# Patient Record
Sex: Female | Born: 1945
Health system: Southern US, Community
[De-identification: ages and names within clinical notes are randomized; demographics above are authoritative.]

## PROBLEM LIST (undated history)

## (undated) DIAGNOSIS — R3915 Urgency of urination: Secondary | ICD-10-CM

## (undated) DIAGNOSIS — M1711 Unilateral primary osteoarthritis, right knee: Secondary | ICD-10-CM

## (undated) DIAGNOSIS — F329 Major depressive disorder, single episode, unspecified: Secondary | ICD-10-CM

## (undated) DIAGNOSIS — M1990 Unspecified osteoarthritis, unspecified site: Secondary | ICD-10-CM

## (undated) DIAGNOSIS — G4700 Insomnia, unspecified: Secondary | ICD-10-CM

## (undated) DIAGNOSIS — R011 Cardiac murmur, unspecified: Secondary | ICD-10-CM

## (undated) DIAGNOSIS — D485 Neoplasm of uncertain behavior of skin: Secondary | ICD-10-CM

## (undated) DIAGNOSIS — Z803 Family history of malignant neoplasm of breast: Secondary | ICD-10-CM

## (undated) DIAGNOSIS — Z801 Family history of malignant neoplasm of trachea, bronchus and lung: Secondary | ICD-10-CM

## (undated) DIAGNOSIS — F419 Anxiety disorder, unspecified: Secondary | ICD-10-CM

## (undated) DIAGNOSIS — F32A Depression, unspecified: Secondary | ICD-10-CM

## (undated) DIAGNOSIS — J189 Pneumonia, unspecified organism: Secondary | ICD-10-CM

## (undated) DIAGNOSIS — C541 Malignant neoplasm of endometrium: Secondary | ICD-10-CM

## (undated) HISTORY — DX: Malignant neoplasm of endometrium: C54.1

## (undated) HISTORY — DX: Neoplasm of uncertain behavior of skin: D48.5

## (undated) HISTORY — DX: Family history of malignant neoplasm of breast: Z80.3

## (undated) HISTORY — PX: COLONOSCOPY: SHX174

## (undated) HISTORY — PX: TONSILLECTOMY: SUR1361

## (undated) HISTORY — PX: EYE SURGERY: SHX253

## (undated) HISTORY — DX: Family history of malignant neoplasm of trachea, bronchus and lung: Z80.1

## (undated) HISTORY — PX: VEIN LIGATION AND STRIPPING: SHX2653

## (undated) NOTE — *Deleted (*Deleted)
Radiation Oncology         [phone] ________________________________  Name: Zoe Kim MRN: [id number]  Date: 03/17/2020  DOB: Nov 12, 1945  Follow-Up Visit Note  CC: Darrow Bussing, MD  Shade Flood, MD  No diagnosis found.  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 Since her last visit, she underwent a bilateral screening mammogram on 12/15/2019 that did not show any evidence of malignancy.   She was last seen by Dr. Andrey Farmer on 12/21/2019, during which time she was noted to have had a complete clinical response to chemotherapy/vaginal brachytherapy.   On review of systems, she reports ***. She denies ***.  ALLERGIES:  is allergic to sulfa antibiotics.  Meds: Current Outpatient Medications  Medication Sig Dispense Refill  . Desvenlafaxine Succinate ER 25 MG TB24 Take 1 tablet by mouth daily.    . diclofenac (VOLTAREN) 75 MG EC tablet Take 75 mg by mouth 2 (two) times daily.    Marland Kitchen gabapentin (NEURONTIN) 300 MG capsule Take 300 mg by mouth at bedtime.    Marland Kitchen MEDROL 4 MG TBPK tablet Take      dosepack as directed on package    . methocarbamol (ROBAXIN) 750 MG tablet Take 750 mg by mouth every 8 (eight) hours as needed.    . methylPREDNISolone acetate (DEPO-MEDROL) 40 MG/ML injection Inject 80 mg into the muscle once.    Marland Kitchen oxybutynin (DITROPAN) 5 MG tablet Take 5 mg by mouth 2 (two) times daily.    . polyethylene glycol-electrolytes (NULYTELY) 420 g solution Take 4,000 mLs by mouth as directed.    Marland Kitchen tiZANidine  (ZANAFLEX) 4 MG tablet      No current facility-administered medications for this encounter.    Physical Findings: The patient is in no acute distress. Patient is alert and oriented.  vitals were not taken for this visit.  Lungs are clear to auscultation bilaterally. Heart has regular rate and rhythm. No palpable cervical, supraclavicular, or axillary adenopathy. Abdomen soft, non-tender, normal bowel sounds. On pelvic examination the external genitalia were unremarkable. A speculum exam was performed. There are no mucosal lesions noted in the vaginal vault. On bimanual  examination there were no pelvic masses appreciated. Vaginal cuff intact.  Some radiation changes noted at the vaginal cuff. ***   Lab Findings: Lab Results  Component Value Date   WBC 5.2 04/27/2019   HGB 10.0 (L) 04/27/2019   HCT 29.7 (L) 04/27/2019   MCV 96.7 04/27/2019   PLT 212 04/27/2019    Radiographic Findings: No results found.  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  Plan: The patient will follow-up with Dr. Andrey Farmer in three months and with radiation oncology in six months.  Total time spent in this encounter was *** minutes which included reviewing the patient's most recent screening mammogram, follow-up with Dr. Andrey Farmer, physical examination, and documentation.  ____________________________________   Billie Lade, PhD, MD  This document  serves as a record of services personally performed by Antony Blackbird, MD. It was created on his behalf by Nikki Dom, a trained medical scribe. The creation of this record is based on the scribe's personal observations and the provider's statements to them. This document has been checked and approved by the attending provider.

---

## 2000-03-27 ENCOUNTER — Other Ambulatory Visit: Admission: RE | Admit: 2000-03-27 | Discharge: 2000-03-27 | Payer: Self-pay | Admitting: Family Medicine

## 2000-05-20 ENCOUNTER — Encounter: Payer: Self-pay | Admitting: Family Medicine

## 2000-05-20 ENCOUNTER — Ambulatory Visit (HOSPITAL_COMMUNITY): Admission: RE | Admit: 2000-05-20 | Discharge: 2000-05-20 | Payer: Self-pay | Admitting: Family Medicine

## 2003-02-16 ENCOUNTER — Other Ambulatory Visit: Admission: RE | Admit: 2003-02-16 | Discharge: 2003-02-16 | Payer: Self-pay | Admitting: Family Medicine

## 2004-06-23 ENCOUNTER — Other Ambulatory Visit: Admission: RE | Admit: 2004-06-23 | Discharge: 2004-06-23 | Payer: Self-pay | Admitting: Family Medicine

## 2004-07-03 ENCOUNTER — Ambulatory Visit (HOSPITAL_COMMUNITY): Admission: RE | Admit: 2004-07-03 | Discharge: 2004-07-03 | Payer: Self-pay | Admitting: Family Medicine

## 2004-07-07 ENCOUNTER — Encounter: Admission: RE | Admit: 2004-07-07 | Discharge: 2004-07-07 | Payer: Self-pay | Admitting: Family Medicine

## 2004-08-04 ENCOUNTER — Ambulatory Visit (HOSPITAL_COMMUNITY): Admission: RE | Admit: 2004-08-04 | Discharge: 2004-08-04 | Payer: Self-pay | Admitting: Gastroenterology

## 2005-10-03 ENCOUNTER — Other Ambulatory Visit: Admission: RE | Admit: 2005-10-03 | Discharge: 2005-10-03 | Payer: Self-pay | Admitting: Family Medicine

## 2011-01-22 ENCOUNTER — Other Ambulatory Visit: Payer: Self-pay | Admitting: Dermatology

## 2011-05-19 ENCOUNTER — Ambulatory Visit (INDEPENDENT_AMBULATORY_CARE_PROVIDER_SITE_OTHER): Payer: Medicare Other

## 2011-05-19 DIAGNOSIS — J158 Pneumonia due to other specified bacteria: Secondary | ICD-10-CM

## 2011-05-19 DIAGNOSIS — J209 Acute bronchitis, unspecified: Secondary | ICD-10-CM

## 2011-05-24 ENCOUNTER — Ambulatory Visit (INDEPENDENT_AMBULATORY_CARE_PROVIDER_SITE_OTHER): Payer: Medicare Other

## 2011-05-24 DIAGNOSIS — R197 Diarrhea, unspecified: Secondary | ICD-10-CM

## 2011-05-24 DIAGNOSIS — R05 Cough: Secondary | ICD-10-CM

## 2011-07-04 ENCOUNTER — Emergency Department (HOSPITAL_COMMUNITY): Payer: Medicare PPO

## 2011-07-04 ENCOUNTER — Encounter (HOSPITAL_COMMUNITY): Payer: Self-pay | Admitting: *Deleted

## 2011-07-04 ENCOUNTER — Emergency Department (HOSPITAL_COMMUNITY)
Admission: EM | Admit: 2011-07-04 | Discharge: 2011-07-04 | Disposition: A | Discharge status: Home / Self Care | Payer: Medicare PPO | Attending: Emergency Medicine | Admitting: Emergency Medicine

## 2011-07-04 DIAGNOSIS — R109 Unspecified abdominal pain: Secondary | ICD-10-CM | POA: Insufficient documentation

## 2011-07-04 DIAGNOSIS — S322XXA Fracture of coccyx, initial encounter for closed fracture: Secondary | ICD-10-CM | POA: Insufficient documentation

## 2011-07-04 DIAGNOSIS — S329XXA Fracture of unspecified parts of lumbosacral spine and pelvis, initial encounter for closed fracture: Secondary | ICD-10-CM

## 2011-07-04 DIAGNOSIS — M25559 Pain in unspecified hip: Secondary | ICD-10-CM | POA: Insufficient documentation

## 2011-07-04 DIAGNOSIS — S3210XA Unspecified fracture of sacrum, initial encounter for closed fracture: Secondary | ICD-10-CM | POA: Insufficient documentation

## 2011-07-04 DIAGNOSIS — S32509A Unspecified fracture of unspecified pubis, initial encounter for closed fracture: Secondary | ICD-10-CM | POA: Insufficient documentation

## 2011-07-04 DIAGNOSIS — F17200 Nicotine dependence, unspecified, uncomplicated: Secondary | ICD-10-CM | POA: Insufficient documentation

## 2011-07-04 HISTORY — DX: Unspecified osteoarthritis, unspecified site: M19.90

## 2011-07-04 MED ORDER — OXYCODONE-ACETAMINOPHEN 5-325 MG PO TABS
2.0000 | ORAL_TABLET | Freq: Once | ORAL | Status: AC
  Administered 2011-07-04: 2 via ORAL
  Filled 2011-07-04: qty 2

## 2011-07-04 MED ORDER — OXYCODONE-ACETAMINOPHEN 5-325 MG PO TABS: 1.0000 | ORAL_TABLET | Freq: Four times a day (QID) | ORAL | Status: AC | PRN

## 2011-07-04 NOTE — ED Notes (Signed)
Warm blankets given.

## 2011-07-04 NOTE — ED Notes (Signed)
Ortho called back. Will be coming

## 2011-07-04 NOTE — Progress Notes (Signed)
Orthopedic Tech Progress Note Patient Details:  Zoe Kim 1946-03-14 [id number]  Other Ortho Devices Type of Ortho Device: Crutches Ortho Device Interventions: Application   Nikki Dom 07/04/2011, 5:28 PM

## 2011-07-04 NOTE — ED Notes (Signed)
Patient fell from a horse during a riding lesson onto her left side/buttock from approx 66 inches.  Patient has pain in her groin/vaginal area.  Patient states she has increased pain when she bears weight.  She denies any other injuries

## 2011-07-04 NOTE — ED Notes (Signed)
Pt fell off horse, landed on left hip. Denying any LOC. No deformities or injuries noted. Pt alert and oriented. Vitals stable. Sensation intact in left extremity. Pain worse upon mobility. Pt sitting on side of bed. No shortening or rotation noted.

## 2011-07-04 NOTE — ED Provider Notes (Signed)
History     CSN: [id number]  Arrival date & time 07/04/11  1249   First MD Initiated Contact with Patient 07/04/11 1300      Chief Complaint  Patient presents with  . Fall    (Consider location/radiation/quality/duration/timing/severity/associated sxs/prior treatment) HPI Comments: Unable to bear weight on LLE due to pain.  No neuro sx  Patient is a 66 y.o. female presenting with fall. The history is provided by the patient. No language interpreter was used.  Fall The accident occurred 3 to 5 hours ago. Incident: while riding a horse. She fell from a height of 3 to 5 ft. She landed on dirt. There was no blood loss. The point of impact was the left hip. Pain location: L groin. The pain is moderate. She was not ambulatory at the scene. There was no entrapment after the fall. There was no drug use involved in the accident. There was no alcohol use involved in the accident. Pertinent negatives include no fever, no numbness, no abdominal pain, no nausea, no vomiting and no headaches. The symptoms are aggravated by standing, flexion, extension and ambulation. She has tried nothing for the symptoms.    Past Medical History  Diagnosis Date  . Arthritis     Past Surgical History  Procedure Date  . Knee surgery   . Vein ligation and stripping   . Tonsillectomy     No family history on file.  History  Substance Use Topics  . Smoking status: Current Everyday Smoker  . Smokeless tobacco: Not on file  . Alcohol Use: No    OB History    Grav Para Term Preterm Abortions TAB SAB Ect Mult Living                  Review of Systems  Constitutional: Negative for fever, activity change, appetite change and fatigue.  HENT: Negative for congestion, sore throat, rhinorrhea, neck pain and neck stiffness.   Respiratory: Negative for cough and shortness of breath.   Cardiovascular: Negative for chest pain and palpitations.  Gastrointestinal: Negative for nausea, vomiting and abdominal pain.   Genitourinary: Negative for dysuria, urgency, frequency and flank pain.  Musculoskeletal: Positive for myalgias, arthralgias and gait problem. Negative for back pain.  Neurological: Negative for dizziness, weakness, light-headedness, numbness and headaches.  All other systems reviewed and are negative.    Allergies  Sulfa antibiotics  Home Medications   Current Outpatient Rx  Name Route Sig Dispense Refill  . NABUMETONE 750 MG PO TABS Oral Take 750 mg by mouth 2 (two) times daily.    . OXYCODONE-ACETAMINOPHEN 5-325 MG PO TABS Oral Take 1-2 tablets by mouth every 6 (six) hours as needed for pain. 20 tablet 0    BP 139/82  Pulse 86  Temp(Src) 97.8 F (36.6 C) (Oral)  Resp 18  Ht 5\' 6"  (1.676 m)  Wt 200 lb (90.719 kg)  BMI 32.28 kg/m2  SpO2 96%  Physical Exam  Nursing note and vitals reviewed. Constitutional: She is oriented to person, place, and time. She appears well-developed and well-nourished.       Appears uncomfortable  HENT:  Head: Normocephalic and atraumatic.  Mouth/Throat: Oropharynx is clear and moist. No oropharyngeal exudate.  Eyes: Conjunctivae and EOM are normal. Pupils are equal, round, and reactive to light.  Neck: Normal range of motion. Neck supple.  Cardiovascular: Normal rate, regular rhythm, normal heart sounds and intact distal pulses.  Exam reveals no gallop and no friction rub.   No murmur  heard. Pulmonary/Chest: Effort normal and breath sounds normal. No respiratory distress.  Abdominal: Soft. Bowel sounds are normal. There is no tenderness.  Musculoskeletal:       Left hip: She exhibits decreased range of motion and tenderness. She exhibits no bony tenderness.  Neurological: She is alert and oriented to person, place, and time. She has normal strength. No cranial nerve deficit or sensory deficit.  Skin: Skin is warm and dry. No rash noted.    ED Course  Procedures (including critical care time)  Labs Reviewed - No data to display Dg Hip  Complete Left  07/04/2011  *RADIOLOGY REPORT*  Clinical Data: Larey Seat from a horse, left hip pain.  LEFT HIP - COMPLETE 2+ VIEW 07/04/2011:  Comparison: None.  Findings: Minimally displaced fractures involving the left superior and inferior pubic rami.  No fractures involving the left femoral neck.  Well-preserved joint space.  Well-preserved bone mineral density.  Included AP pelvis demonstrates a normal appearing symmetric contralateral right hip joint.  Sacroiliac joints and symphysis pubis intact.  Degenerative changes involving the visualized lower lumbar spine.  IMPRESSION: Minimally displaced fractures involving the left superior and inferior pubic rami.  Original Report Authenticated By: Arnell Sieving, M.D.     1. Pelvic fracture       MDM  Pelvic ramus fracture. This is seen on plain film of the hip. We ordered a CAT scan after discussing with orthopedics to rule out diastases of the posterior aspect of the pelvis. She'll be discharged home if there is no additional abnormality with weightbearing as tolerated and aggressive pain control. Instructed to followup with orthopedics tomorrow.  Signed out to dr Juluis Mire, MD 07/04/11 1620

## 2011-07-04 NOTE — ED Provider Notes (Addendum)
Assumed care from Dr. Brooke Dare. Need to check ct of pelvis.  If no severe injury, can go home with wt bearing as tolerated.  Nicholes Stairs, MD 07/04/11 [phone]  I reviewed the ct and discussed the result with Dr. Lestine Box.  He says she can go home and f/u in office tomorrow.  Nicholes Stairs, MD 07/04/11 [phone]

## 2011-08-06 ENCOUNTER — Ambulatory Visit (INDEPENDENT_AMBULATORY_CARE_PROVIDER_SITE_OTHER): Payer: Medicare Other | Admitting: Family Medicine

## 2011-08-06 DIAGNOSIS — F339 Major depressive disorder, recurrent, unspecified: Secondary | ICD-10-CM

## 2011-08-06 DIAGNOSIS — R5381 Other malaise: Secondary | ICD-10-CM

## 2011-08-06 DIAGNOSIS — R5383 Other fatigue: Secondary | ICD-10-CM

## 2011-08-06 DIAGNOSIS — R6889 Other general symptoms and signs: Secondary | ICD-10-CM

## 2011-08-06 LAB — TSH: TSH: 1.845 u[IU]/mL (ref 0.350–4.500)

## 2011-08-06 MED ORDER — FLUOXETINE HCL 20 MG PO TABS: 20.0000 mg | ORAL_TABLET | Freq: Every day | ORAL | Status: DC

## 2011-08-06 NOTE — Progress Notes (Signed)
  Subjective:    Patient ID: Zoe Kim, female    DOB: 25-May-1946, 66 y.o.   MRN: [id number]  HPI Zoe Kim is a 66 y.o. female Hx of depression per chart, but last noted zoloft 50mg  qd 06/28/09 ov.    S/p L pelvic fracture 07/04/11 from fall off horse., no surgery needed - followed Dr. Margaretha Sheffield at [facility] ortho.    See ER notes - Rx percocet.  Stopped percocet 1 week ago.  Excitable/very active and euphoric when taking percocet.  More fatigued the next day.  Due to these side effects- wanted to stop meds.  Next day - in bed all day, more depressed, crying all day.  Unable to work last week due to these symptoms. Feells mentally depressed, general malaise. " feels unplugged".   No recent SSRI - last on zoloft - didn't feel like better.  Concerns for future - retirement, etc coming off medicine.  Had to put down other horse 2 days after injury.  Review of Systems  Constitutional: Positive for activity change and fatigue. Negative for fever, chills and appetite change.  HENT: Positive for dental problem. Negative for congestion, sore throat, rhinorrhea, sneezing, trouble swallowing, neck pain, neck stiffness and sinus pressure.   Respiratory: Negative for cough and shortness of breath.   Cardiovascular: Negative for chest pain.  Neurological: Positive for headaches.       HA last week, not now.  Psychiatric/Behavioral: Positive for dysphoric mood and agitation. Negative for suicidal ideas, hallucinations, self-injury and decreased concentration. The patient is not nervous/anxious.    Cold natured for past year or two. 7 pounds past 1-2 year - weight gain, dry skin, and hair falling out more than prior.    Objective:   Physical Exam  Constitutional: She appears well-developed and well-nourished.  HENT:  Head: Normocephalic and atraumatic.  Right Ear: External ear normal.  Left Ear: External ear normal.  Eyes: Conjunctivae are normal. Pupils are equal, round, and reactive  to light.  Neck: No thyromegaly present.  Cardiovascular: Normal rate, regular rhythm, normal heart sounds and intact distal pulses.   No murmur heard. Pulmonary/Chest: Effort normal and breath sounds normal.  Abdominal: Soft.  Lymphadenopathy:    She has no cervical adenopathy.  Skin: Skin is warm and dry.  Psychiatric: She has a normal mood and affect. Her behavior is normal. Judgment and thought content normal. Her speech is not rapid and/or pressured.       Overall euthymic, but lat affect at times, but tearful at times, laugh at times.           Assessment & Plan:  Zoe Kim is a 66 y.o. female Hx depression off meds for years with recent recurrnec of depressed mood, fatigue after cessation of narcotic, pelvvic fracture and putting down her horse.  Suspect adjustment component with recent stressors, underlying depression.  Weight gain, cold nature, skin/hair change - check TSH. Start prozac 20mg  qd  # 30, 1 rf. SED., pt to contact her prior counselor to restart therapy, and recheck next 2-3 weeks., sooner if any acute worsening.

## 2011-08-06 NOTE — Patient Instructions (Signed)
Start prozac, and call counselor as discussed.  Recheck in next 3 weeks. Return to the clinic or go to the nearest emergency room if any of your symptoms worsen or new symptoms occur.

## 2011-08-09 ENCOUNTER — Ambulatory Visit (INDEPENDENT_AMBULATORY_CARE_PROVIDER_SITE_OTHER): Payer: Medicare Other | Admitting: Family Medicine

## 2011-08-09 VITALS — BP 154/89 | HR 93 | Temp 99.0°F | Resp 16 | Ht 65.5 in | Wt 194.0 lb

## 2011-08-09 DIAGNOSIS — R197 Diarrhea, unspecified: Secondary | ICD-10-CM

## 2011-08-09 DIAGNOSIS — R5383 Other fatigue: Secondary | ICD-10-CM

## 2011-08-09 DIAGNOSIS — R5381 Other malaise: Secondary | ICD-10-CM

## 2011-08-09 MED ORDER — DIPHENOXYLATE-ATROPINE 2.5-0.025 MG PO TABS: 1.0000 | ORAL_TABLET | Freq: Four times a day (QID) | ORAL | Status: AC | PRN

## 2011-08-09 NOTE — Progress Notes (Signed)
  Subjective:    Patient ID: Zoe Kim, female    DOB: [birth year], 66 y.o.   MRN: [id number]  HPI 66 yo female here with abdominal complaints. Fractured pelvix end of January - put on oxycodone by ortho Last week back to ortho - stopped oxycodone "Crashed" next several days Came here last Monday - dx with depression.  Started prozac Mentally starting to feel better. Physically still feels badly - fatigue/exhaustion, diarrhea since off oxycodone and taking pepto bismol.  Diarrhea is watery.  No blood.     Has missed 2 weeks of work.     Review of Systems Negative except as per HPI     Objective:   Physical Exam  Constitutional: Vital signs are normal. She appears well-developed and well-nourished. She is active.  Cardiovascular: Normal rate, regular rhythm, normal heart sounds and normal pulses.   Pulmonary/Chest: Effort normal and breath sounds normal.  Abdominal: Soft. Normal appearance and bowel sounds are normal. She exhibits no distension and no mass. There is no hepatosplenomegaly. There is tenderness. There is no rigidity, no rebound, no guarding, no CVA tenderness, no tenderness at McBurney's point and negative Murphy's sign. No hernia.  Neurological: She is alert.          Assessment & Plan:  Diarrhea - no risk factors or signs of bacterial cause.  Find oxycodone withdrawal less lkely but I suppose possible.  Try lomotil.  Increase fiber.  Monitor.   Fatigue - related to diarrhea vs. Depression symptom not yet improved.  Check cmet.  tSH was normal at last visit.

## 2011-08-10 LAB — COMPREHENSIVE METABOLIC PANEL
ALT: 15 U/L (ref 0–35)
BUN: 7 mg/dL (ref 6–23)
CO2: 24 mEq/L (ref 19–32)
Creat: 0.99 mg/dL (ref 0.50–1.10)
Total Bilirubin: 1 mg/dL (ref 0.3–1.2)

## 2011-08-31 ENCOUNTER — Other Ambulatory Visit: Payer: Self-pay | Admitting: Family Medicine

## 2011-08-31 ENCOUNTER — Encounter: Payer: Self-pay | Admitting: Family Medicine

## 2011-08-31 ENCOUNTER — Ambulatory Visit (INDEPENDENT_AMBULATORY_CARE_PROVIDER_SITE_OTHER): Payer: Medicare Other | Admitting: Family Medicine

## 2011-08-31 VITALS — BP 143/87 | HR 76 | Temp 97.8°F | Resp 16 | Ht 65.0 in | Wt 192.0 lb

## 2011-08-31 DIAGNOSIS — Z0000 Encounter for general adult medical examination without abnormal findings: Secondary | ICD-10-CM

## 2011-08-31 DIAGNOSIS — Z139 Encounter for screening, unspecified: Secondary | ICD-10-CM

## 2011-08-31 DIAGNOSIS — Z124 Encounter for screening for malignant neoplasm of cervix: Secondary | ICD-10-CM

## 2011-08-31 MED ORDER — ZOSTER VACCINE LIVE 19400 UNT/0.65ML ~~LOC~~ SOLR: 0.6500 mL | Freq: Once | SUBCUTANEOUS | Status: AC

## 2011-08-31 MED ORDER — DIPHENOXYLATE-ATROPINE 2.5-0.025 MG PO TABS: 1.0000 | ORAL_TABLET | Freq: Four times a day (QID) | ORAL | Status: AC | PRN

## 2011-08-31 NOTE — Patient Instructions (Signed)
Follow up in next 1 month to discuss meds and abdominal symptoms Sooner if any worsening.

## 2011-08-31 NOTE — Progress Notes (Signed)
  Subjective:    Patient ID: Zoe Kim, female    DOB: February 24, 1946, 66 y.o.   MRN: [id number]  HPI Zoe Kim is a 66 y.o. female Annual Medicare pe - see ppwk.  Last pap smear years ago. Depression - improved.  Tolerating Prozac at current dose - see prior visits and depression scale - scanned.  Diarrhea if not taking lomotil  - 1 per day.  Scheduling colonoscopy.  BM qd when on lomotil.  Only having to take lomotil once per day.   Not fasting today.    Review of Systems Per P[facility] -  13 point ROS reviewed.    Objective:   Physical Exam  Constitutional: She is oriented to person, place, and time. She appears well-developed and well-nourished.  HENT:  Head: Normocephalic and atraumatic.  Right Ear: External ear normal.  Left Ear: External ear normal.  Eyes: Conjunctivae and EOM are normal. Pupils are equal, round, and reactive to light.  Neck: Normal range of motion. Neck supple. No thyromegaly present.  Cardiovascular: Normal rate, regular rhythm, normal heart sounds and intact distal pulses.   Pulmonary/Chest: Effort normal and breath sounds normal.  Abdominal: Soft. Bowel sounds are normal. She exhibits no distension. There is no tenderness. There is no rebound and no guarding.  Genitourinary: Vagina normal and uterus normal. Cervix exhibits no motion tenderness and no friability. Right adnexum displays no mass and no tenderness. Left adnexum displays no tenderness.       Polypoid appearing os of cervical canal. NT, no blood seen.  Neurological: She is alert and oriented to person, place, and time.  Skin: Skin is warm and dry.  Psychiatric: She has a normal mood and affect. Her behavior is normal. Judgment and thought content normal.       Assessment & Plan:  Annual Medicare Wellness visit. Not fasting, but had recent CMP. Can check lipids at future visit when fasting. Check Pap5 - if negative/normal, consider cessation of Pap testing as over 65. Polyp -  appearing cervical os, without friability.  Pap results pending.  Will give numbers for mammogram for her to schedule.  Zostavax rx sent to pharmacy  Diarrhea - ? Med related vs IBS/functional.  Can continue Lomotil one per day for now, but recheck next few weeks (within 1 month) to discuss. Follow up colonoscopy to be scheduled by pt - already had a reminder card.  Hemosure sent home with pt.  Visual acuity noted - 20/50.   2 years since optho visit.  Pt is scheduling follow up.  Recheck  in 1 month.

## 2011-09-01 NOTE — Progress Notes (Signed)
Called pt, [facility] and she would not need referral to set up appt

## 2011-09-06 LAB — PAP IG AND HPV HIGH-RISK: HPV DNA High Risk: NOT DETECTED

## 2011-10-12 ENCOUNTER — Ambulatory Visit: Payer: Medicare Other | Admitting: Family Medicine

## 2011-10-19 ENCOUNTER — Other Ambulatory Visit: Payer: Self-pay | Admitting: Family Medicine

## 2011-10-26 ENCOUNTER — Ambulatory Visit: Payer: Medicare Other | Admitting: Family Medicine

## 2011-11-16 ENCOUNTER — Ambulatory Visit (INDEPENDENT_AMBULATORY_CARE_PROVIDER_SITE_OTHER): Payer: Medicare Other | Admitting: Family Medicine

## 2011-11-16 VITALS — BP 132/81 | HR 82 | Temp 97.8°F | Resp 16 | Ht 66.0 in | Wt 194.6 lb

## 2011-11-16 DIAGNOSIS — R197 Diarrhea, unspecified: Secondary | ICD-10-CM

## 2011-11-16 DIAGNOSIS — Z23 Encounter for immunization: Secondary | ICD-10-CM

## 2011-11-16 DIAGNOSIS — G4700 Insomnia, unspecified: Secondary | ICD-10-CM

## 2011-11-16 DIAGNOSIS — F329 Major depressive disorder, single episode, unspecified: Secondary | ICD-10-CM

## 2011-11-16 MED ORDER — ZOSTER VACCINE LIVE 19400 UNT/0.65ML ~~LOC~~ SOLR: 0.6500 mL | Freq: Once | SUBCUTANEOUS | Status: AC

## 2011-11-16 NOTE — Progress Notes (Signed)
  Subjective:    Patient ID: Charlie Pitter, female    DOB: May 29, 1946, 66 y.o.   MRN: [id number]  HPI SEJAL COFIELD is a 66 y.o. female Diarrhea - see prior ov's in march - treated with Lomotil QD prn. Resolved with immodium.    Appt in July for colonoscopy and mammogram.  Hx depression -  off meds for years with recurrence - started Prozac 20mg  qd on 08/06/11 for depressed mood, fatigue after cessation of narcotic, pelvic fracture and putting down her horse.  Suspect adjustment component with recent stressors, underlying depression. Improved at 08/31/11 office visit. Had to reschedule last few appointments.  Off meds for 1 month - feels fine.  Thinks that depression usually due to stress and sleep deprivation.  Taking advil pm - few times per week.  Able to sleep all night with this meds. Riding horse again.  Shingles vaccine - has to be given in doctor's office to be covered.    Review of Systems  Constitutional: Negative for fever and chills.  Gastrointestinal: Negative for nausea, vomiting, abdominal pain and diarrhea.  Psychiatric/Behavioral: Positive for disturbed wake/sleep cycle. Negative for suicidal ideas and dysphoric mood.       Objective:   Physical Exam  Constitutional: She is oriented to person, place, and time. She appears well-developed and well-nourished.  HENT:  Head: Normocephalic and atraumatic.  Eyes: Conjunctivae are normal. Pupils are equal, round, and reactive to light.  Cardiovascular: Normal rate, regular rhythm, normal heart sounds and intact distal pulses.   Pulmonary/Chest: Effort normal.  Abdominal: Soft.  Neurological: She is alert and oriented to person, place, and time.  Skin: Skin is warm and dry.  Psychiatric: She has a normal mood and affect. Her behavior is normal.       Assessment & Plan:  CHAYLEE EHRSAM is a 66 y.o. female 1. Depression    Mood ok without meds. Episodic insomnia - but controlled with occasional Advil PM  use. Should improve as activity/exercise level increases.   Diarrhea - resolved.   [place] [facility] for shingles vaccine - rx given.

## 2012-05-22 ENCOUNTER — Telehealth: Payer: Self-pay

## 2012-05-22 ENCOUNTER — Ambulatory Visit (INDEPENDENT_AMBULATORY_CARE_PROVIDER_SITE_OTHER): Payer: Medicare PPO | Admitting: Family Medicine

## 2012-05-22 VITALS — BP 145/84 | HR 82 | Temp 97.9°F | Resp 18 | Ht 66.0 in | Wt 198.0 lb

## 2012-05-22 DIAGNOSIS — R7989 Other specified abnormal findings of blood chemistry: Secondary | ICD-10-CM

## 2012-05-22 DIAGNOSIS — R6889 Other general symptoms and signs: Secondary | ICD-10-CM

## 2012-05-22 DIAGNOSIS — N950 Postmenopausal bleeding: Secondary | ICD-10-CM

## 2012-05-22 DIAGNOSIS — R109 Unspecified abdominal pain: Secondary | ICD-10-CM

## 2012-05-22 DIAGNOSIS — R5381 Other malaise: Secondary | ICD-10-CM

## 2012-05-22 DIAGNOSIS — R5383 Other fatigue: Secondary | ICD-10-CM

## 2012-05-22 DIAGNOSIS — R1030 Lower abdominal pain, unspecified: Secondary | ICD-10-CM

## 2012-05-22 LAB — POCT URINALYSIS DIPSTICK
Bilirubin, UA: NEGATIVE
Glucose, UA: NEGATIVE
Leukocytes, UA: NEGATIVE
Nitrite, UA: NEGATIVE
Urobilinogen, UA: 0.2

## 2012-05-22 LAB — POCT CBC
Granulocyte percent: 60.9 %G (ref 37–80)
HCT, POC: 42.6 % (ref 37.7–47.9)
Hemoglobin: 13.3 g/dL (ref 12.2–16.2)
Lymph, poc: 2.6 (ref 0.6–3.4)
MCH, POC: 28.3 pg (ref 27–31.2)
MCHC: 31.2 g/dL — AB (ref 31.8–35.4)
MCV: 90.6 fL (ref 80–97)
MID (cbc): 0.5 (ref 0–0.9)
MPV: 8.2 fL (ref 0–99.8)
POC Granulocyte: 4.9 (ref 2–6.9)
POC LYMPH PERCENT: 32.9 %L (ref 10–50)
POC MID %: 6.2 %M (ref 0–12)
Platelet Count, POC: 334 10*3/uL (ref 142–424)
RBC: 4.7 M/uL (ref 4.04–5.48)
RDW, POC: 13.6 %
WBC: 8 10*3/uL (ref 4.6–10.2)

## 2012-05-22 LAB — COMPREHENSIVE METABOLIC PANEL
ALT: 22 U/L (ref 0–35)
AST: 23 U/L (ref 0–37)
Albumin: 4.1 g/dL (ref 3.5–5.2)
Alkaline Phosphatase: 152 U/L — ABNORMAL HIGH (ref 39–117)
BUN: 9 mg/dL (ref 6–23)
CO2: 28 mEq/L (ref 19–32)
Calcium: 9.2 mg/dL (ref 8.4–10.5)
Chloride: 107 mEq/L (ref 96–112)
Creat: 0.86 mg/dL (ref 0.50–1.10)
Glucose, Bld: 88 mg/dL (ref 70–99)
Potassium: 3.9 mEq/L (ref 3.5–5.3)
Sodium: 141 mEq/L (ref 135–145)
Total Bilirubin: 0.5 mg/dL (ref 0.3–1.2)
Total Protein: 6.7 g/dL (ref 6.0–8.3)

## 2012-05-22 LAB — POCT UA - MICROSCOPIC ONLY
Casts, Ur, LPF, POC: NEGATIVE
Crystals, Ur, HPF, POC: NEGATIVE
Mucus, UA: NEGATIVE
Yeast, UA: NEGATIVE

## 2012-05-22 LAB — TSH: TSH: 1.697 u[IU]/mL (ref 0.350–4.500)

## 2012-05-22 NOTE — Telephone Encounter (Signed)
DR. Neva Seat LEFT A VOICEMAIL FOR PT. WANTING TO KNOW THE BEST TIME TO CLL HER BACK. SHE SAID ANY TIME AFTER 9AM TOMORROW (12/20) SHE WILL BE AVAILABLE  [phone] OR [phone]
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
## 2012-05-23 NOTE — Telephone Encounter (Signed)
Called pt - see lab notes.

## 2012-05-25 ENCOUNTER — Ambulatory Visit (INDEPENDENT_AMBULATORY_CARE_PROVIDER_SITE_OTHER): Payer: Medicare PPO | Admitting: Emergency Medicine

## 2012-05-25 ENCOUNTER — Ambulatory Visit: Payer: Medicare PPO

## 2012-05-25 VITALS — BP 128/85 | HR 93 | Temp 98.5°F | Resp 17 | Ht 66.5 in | Wt 198.0 lb

## 2012-05-25 DIAGNOSIS — R079 Chest pain, unspecified: Secondary | ICD-10-CM

## 2012-05-25 DIAGNOSIS — R0609 Other forms of dyspnea: Secondary | ICD-10-CM

## 2012-05-25 DIAGNOSIS — R5381 Other malaise: Secondary | ICD-10-CM

## 2012-05-25 DIAGNOSIS — R5383 Other fatigue: Secondary | ICD-10-CM

## 2012-05-25 DIAGNOSIS — R0989 Other specified symptoms and signs involving the circulatory and respiratory systems: Secondary | ICD-10-CM

## 2012-05-25 NOTE — Progress Notes (Signed)
  Subjective:    Patient ID: Zoe Kim, female    DOB: [birth year], 66 y.o.   MRN: [id number]  HPI patient states for the last year she has felt fatigued with early dyspnea on exertion. She denies any recent episodes of chest pain. She states she does have discomfort in her chest when she rides in a car. She tried to do some dancing recently and was able to dance for about 10 seconds and became short of breath. Her main complaint is of horrible fatigue. She does not have any energy to do any other physical activity.    Review of Systems     Objective:   Physical Exam HEENT exam is unremarkable. Neck is supple. Chest is clear to auscultation and percussion. Cardiac exam reveals a regular rate no gallop is heard. Soft liver and spleen not enlarged there no areas of tenderness. Exam reveals trace edema no calf tenderness  [facility] reading (PRIMARY) by  Dr.Quorra Rosene no acute disease  EKG no acute change     Assessment & Plan:  Appointment has been made with cardiologist so  that he can do further evaluation.

## 2012-05-27 ENCOUNTER — Ambulatory Visit
Admission: RE | Admit: 2012-05-27 | Discharge: 2012-05-27 | Disposition: A | Discharge status: Home / Self Care | Payer: Medicare PPO | Source: Ambulatory Visit | Attending: Family Medicine | Admitting: Family Medicine

## 2012-05-27 DIAGNOSIS — R1030 Lower abdominal pain, unspecified: Secondary | ICD-10-CM

## 2012-05-27 DIAGNOSIS — N950 Postmenopausal bleeding: Secondary | ICD-10-CM

## 2012-06-03 ENCOUNTER — Telehealth: Payer: Self-pay

## 2012-06-03 NOTE — Telephone Encounter (Signed)
Please call the patient. No acute findings by ultrasound. Negative for gallstones or cholecystitis. Likely mild fatty liver, work to improve diet. Some atherosclerosis of the aorta with mild ectasia. Could consider repeat in 1-2 years to document trending. Follow up as directed by Dr. Neva Seat.  I have advised patient of this, and she is asking about stress test referral. This has been sent to Dr Jacinto Halim, I have given her the number for his office so she can call about the appt.

## 2012-06-03 NOTE — Telephone Encounter (Signed)
Pt has recently been sent to [facility] by dr Francene Finders not heard any results would like [place] to call her with these at [phone] or [phone]

## 2012-06-06 ENCOUNTER — Ambulatory Visit: Payer: Medicare Other | Admitting: Family Medicine

## 2012-06-09 ENCOUNTER — Ambulatory Visit (INDEPENDENT_AMBULATORY_CARE_PROVIDER_SITE_OTHER): Payer: Medicare PPO | Admitting: Family Medicine

## 2012-06-09 ENCOUNTER — Encounter: Payer: Self-pay | Admitting: Family Medicine

## 2012-06-09 VITALS — BP 130/80 | HR 95 | Temp 99.0°F | Resp 16 | Ht 65.0 in | Wt 196.0 lb

## 2012-06-09 DIAGNOSIS — Z23 Encounter for immunization: Secondary | ICD-10-CM

## 2012-06-09 DIAGNOSIS — R0989 Other specified symptoms and signs involving the circulatory and respiratory systems: Secondary | ICD-10-CM

## 2012-06-09 DIAGNOSIS — N950 Postmenopausal bleeding: Secondary | ICD-10-CM

## 2012-06-09 DIAGNOSIS — Z8249 Family history of ischemic heart disease and other diseases of the circulatory system: Secondary | ICD-10-CM

## 2012-06-09 NOTE — Progress Notes (Signed)
Subjective:    Patient ID: Zoe Kim, female    DOB: 09-27-1945, 67 y.o.   MRN: [id number]  HPI Zoe Kim is a 67 y.o. female  See last ov on 05/22/12. Fatigue, generalized malaise.  Further hx on phone call after visit - episodic DOE for past year, and CP at times riding in a car. Multiple family members with CAD including Dad and brother with heart disease and MI. She has not had stress testing. Seen by Dr. Cleta Alberts in follow up - NAD on CXR, and no acute findings on EKG.  Referred to cardiology (Dr. Jacinto Halim) for eval.  Has stress scheduled for tomorrow at 3:30.  Felt a "bubble sensation/tickle in chest: when sitting at computer - felt like heart paused.  Trouble to breathe and near syncopal, but did not pass out. Only lasted few seconds. No recent change in activity.  Other hx - multiple family members with COPD.   Elevated Alk Phos - increased slightly - 158 in March 2013, 152 last ov. No acute findings by abdominal ultrasound. Negative for gallstones or cholecystitis. Likely mild fatty liver, some atherosclerosis of the aorta with mild ectasia, with possible repeat in 1-2 years to document trending. Has been changing diet - less fast food - none recently.    Post menopausal bleeding - referred to OBGYN - has not received call.  Bleeding stopped after one week - small amount of spotting this morning.    Tetanus today, flu vaccine 2 weeks.    Results for orders placed in visit on 05/22/12  POCT CBC      Component Value Range   WBC 8.0  4.6 - 10.2 K/uL   Lymph, poc 2.6  0.6 - 3.4   POC LYMPH PERCENT 32.9  10 - 50 %L   MID (cbc) 0.5  0 - 0.9   POC MID % 6.2  0 - 12 %M   POC Granulocyte 4.9  2 - 6.9   Granulocyte percent 60.9  37 - 80 %G   RBC 4.70  4.04 - 5.48 M/uL   Hemoglobin 13.3  12.2 - 16.2 g/dL   HCT, POC 78.2  95.6 - 47.9 %   MCV 90.6  80 - 97 fL   MCH, POC 28.3  27 - 31.2 pg   MCHC 31.2 (*) 31.8 - 35.4 g/dL   RDW, POC 21.3     Platelet Count, POC 334  142 -  424 K/uL   MPV 8.2  0 - 99.8 fL  POCT URINALYSIS DIPSTICK      Component Value Range   Color, UA yellow     Clarity, UA clear     Glucose, UA neg     Bilirubin, UA neg     Ketones, UA neg     Spec Grav, UA >=1.030     Blood, UA large     pH, UA 5.5     Protein, UA neg     Urobilinogen, UA 0.2     Nitrite, UA neg     Leukocytes, UA Negative    POCT UA - MICROSCOPIC ONLY      Component Value Range   WBC, Ur, HPF, POC 0-1     RBC, urine, microscopic 0-3     Bacteria, U Microscopic trace     Mucus, UA neg     Epithelial cells, urine per micros 0-1     Crystals, Ur, HPF, POC neg     Casts, Ur, LPF, POC neg  Yeast, UA neg    COMPREHENSIVE METABOLIC PANEL      Component Value Range   Sodium 141  135 - 145 mEq/L   Potassium 3.9  3.5 - 5.3 mEq/L   Chloride 107  96 - 112 mEq/L   CO2 28  19 - 32 mEq/L   Glucose, Bld 88  70 - 99 mg/dL   BUN 9  6 - 23 mg/dL   Creat 1.61  0.96 - 0.45 mg/dL   Total Bilirubin 0.5  0.3 - 1.2 mg/dL   Alkaline Phosphatase 152 (*) 39 - 117 U/L   AST 23  0 - 37 U/L   ALT 22  0 - 35 U/L   Total Protein 6.7  6.0 - 8.3 g/dL   Albumin 4.1  3.5 - 5.2 g/dL   Calcium 9.2  8.4 - 40.9 mg/dL  TSH      Component Value Range   TSH 1.697  0.350 - 4.500 uIU/mL    Review of Systems  Constitutional: Negative for fever and chills.  Respiratory: Positive for chest tightness (as above.) and shortness of breath.   Genitourinary: Positive for vaginal bleeding.   As above.     Objective:   Physical Exam  Vitals reviewed. Constitutional: She is oriented to person, place, and time. She appears well-developed and well-nourished.  HENT:  Head: Normocephalic and atraumatic.  Eyes: Conjunctivae normal and EOM are normal. Pupils are equal, round, and reactive to light.  Neck: Carotid bruit is not present.  Cardiovascular: Normal rate, regular rhythm, normal heart sounds and intact distal pulses.   Pulmonary/Chest: Effort normal and breath sounds normal.  Abdominal:  Soft. She exhibits no pulsatile midline mass. There is no tenderness.  Neurological: She is alert and oriented to person, place, and time.  Skin: Skin is warm and dry.  Psychiatric: She has a normal mood and affect. Her behavior is normal.        Assessment & Plan:  Zoe Kim is a 67 y.o. female 1. Need for prophylactic vaccination with combined diphtheria-tetanus-pertussis (DTP) vaccine  Tdap vaccine greater than or equal to 7yo IM given.  2. DOE (dyspnea on exertion)   planning on follow up with cards for eval and stress testing.  Rtc/er cp precautions reviewed.   3. FH: CAD (coronary artery disease)    4. Postmenopausal bleeding  Referral placed to OBGYN.     Patient Instructions  keep appointment for stress test.  We are checking into your appointment to a gynecologist.  Return to the clinic or go to the nearest emergency room if any of your symptoms worsen or new symptoms occur.

## 2012-06-09 NOTE — Patient Instructions (Signed)
keep appointment for stress test.  We are checking into your appointment to a gynecologist.  Return to the clinic or go to the nearest emergency room if any of your symptoms worsen or new symptoms occur.

## 2012-06-10 ENCOUNTER — Telehealth: Payer: Self-pay | Admitting: *Deleted

## 2012-06-10 NOTE — Telephone Encounter (Signed)
Continued from previous phone message Stated that we cannot help her and goodbye.

## 2012-06-10 NOTE — Telephone Encounter (Signed)
Pt called and was very upset that someone had called and she missed the call.  I placed her on hold and tried to find out who possibly could have called her.  There was no one in referrals, no one in labs, or no TL that could have called her.  There were no messages in chart at all.  She stated that she had dialed [phone] and our number had came up.  Pt was very frustrated that I could not help her and

## 2012-08-28 ENCOUNTER — Encounter: Payer: Self-pay | Admitting: Emergency Medicine

## 2013-06-25 ENCOUNTER — Telehealth: Payer: Self-pay

## 2013-06-25 DIAGNOSIS — Z135 Encounter for screening for eye and ear disorders: Secondary | ICD-10-CM

## 2013-06-25 NOTE — Telephone Encounter (Signed)
Pt is stating that she saw dr Carlota Raspberry a couple of months ago and scheduled a self appt with dr Bing Plume but found out today that she needs a referral   Best number [phone]

## 2013-06-25 NOTE — Telephone Encounter (Signed)
I don't mind referring her, but what is she seeing Dr. Bing Plume for? And if not already scheduled, recommend follow up appt with me as last ov over a year ago.

## 2013-06-26 NOTE — Telephone Encounter (Signed)
Pt is seeing Dr. Bing Plume for an eye exam- routine screening. She is having increased difficulty seeing at night. Cataracts runs in her family and she would like to be tested. She knows of  Dr. Bing Plume personally and chose to see him.   FYI Pt will call this afternoon to make a follow up appt with you.   Sent in Referral for optometrist.

## 2013-12-14 ENCOUNTER — Ambulatory Visit (INDEPENDENT_AMBULATORY_CARE_PROVIDER_SITE_OTHER): Payer: Medicare PPO | Admitting: Family Medicine

## 2013-12-14 ENCOUNTER — Encounter: Payer: Self-pay | Admitting: Family Medicine

## 2013-12-14 VITALS — BP 134/78 | HR 91 | Temp 98.1°F | Resp 16 | Ht 65.0 in | Wt 188.9 lb

## 2013-12-14 DIAGNOSIS — R413 Other amnesia: Secondary | ICD-10-CM

## 2013-12-14 DIAGNOSIS — G4700 Insomnia, unspecified: Secondary | ICD-10-CM

## 2013-12-14 DIAGNOSIS — F341 Dysthymic disorder: Secondary | ICD-10-CM

## 2013-12-14 DIAGNOSIS — F418 Other specified anxiety disorders: Secondary | ICD-10-CM

## 2013-12-14 MED ORDER — FLUOXETINE HCL 20 MG PO CAPS
20.0000 mg | ORAL_CAPSULE | Freq: Every day | ORAL | Status: DC
Start: 2013-12-14 — End: 2014-01-11

## 2013-12-14 MED ORDER — CLONAZEPAM 0.5 MG PO TABS: 0.5000 mg | ORAL_TABLET | Freq: Two times a day (BID) | ORAL | Status: DC | PRN

## 2013-12-14 NOTE — Patient Instructions (Signed)
Restart Prozac once per day. Klonopin twice per day if needed. Follow up with me in 3-4 weeks, but if any worsening of symptoms - see me sooner or check in at [facility] emergency room.   Try calling Arvil Chaco: [phone].  If no availability with her - let me know, and I can get you some other numbers.

## 2013-12-14 NOTE — Progress Notes (Addendum)
Subjective:  This chart was scribed for Zoe Ray, MD by Zoe Kim, Medical Scribe. This patient was seen in Room 21 and the patient's care was started at 1:30 PM.   Patient ID: Zoe Kim, female    DOB: [birth year], 68 y.o.   MRN: [id number]  HPI HPI Comments: Zoe Kim is a 68 y.o. female who presents to the [facility] complaining of memory loss.  The patient states that she had an "episode" last week and wanted to be put in a home.  The patient states over the past 4-5 months she has had trouble remembering people's names.  She states that she has experienced these symptoms before but they have become more troublesome lately.  The patient states last week a woman came into her office claiming that she had made an appointment with her and she did not remember making the appointment or the woman.  The patient states that she became annoyed with the woman and she kicked her out of her office.  She lists SI as an associated symptom.  She states that when she gets angry she will smack herself on her head with whatever she has on hand.  She states that the pain she experiences is a relief.  She states that she was frustrated with herself last week and told her husband that "she would be better off gone".  The patient states that she is not sure if she would do anything to kill herself but she does wish she would disappear at times. Does not have any actual plan on suicide.  She states that she saw Zoe Kim when she first moved to [facility] for counseling when she was going through a divorce and the loss of her brother.    The patient states that she has been experiencing a lot of stress over the last 2 years but states that her stress has gradually worsened over the past couple of weeks.  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  She states that she feels as though the board of directors are not on her side and feels as though they are trying to get her to quit her job.  The patient lists sleep disturbance as an associated symptom.  She states that she has been using Ibuprofen PM intermittently to help her get to sleep.  She denies any alcohol use or IDU.    The patient was last seen by Dr. Carlota Kim in January of last year but she was also seen in December 2013 with suspected Dystimeia and prior history of depression.  The depression of 2013 was after having a pelvic fracture and having to put down her horse along with other outside stressors.  The patient had a normal TSH in December 2013 of 1.7.  She was prescribed Prozac 20mg  qd.  She was off meds for one month in June 2013 and had done well off of medications at that time.  The patient states that she feels as though something in her is opposed to taking Prozac.   There are no active problems to display for this patient.  Past Medical History  Diagnosis Date  . Arthritis    Past Surgical History  Procedure Laterality Date  . Knee surgery    .  Vein ligation and stripping    . Tonsillectomy     Allergies  Allergen Reactions  . Sulfa Antibiotics    Prior to Admission medications   Medication Sig Start Date End Date Taking? Authorizing Provider  ibuprofen (ADVIL,MOTRIN) 200 MG tablet Take 200 mg by mouth every 6 (six) hours as needed.   Yes Historical Provider, MD  FLUoxetine (PROZAC) 20 MG capsule Take 1 capsule (20 mg total) by mouth daily. NEEDS OFFICE VISIT 10/19/11   Rise Mu, PA-C  nabumetone (RELAFEN) 750 MG tablet Take 750 mg by mouth 2 (two) times daily.    Historical Provider, MD   History   Social History  . Marital Status: Married    Spouse Name: N/A    Number of Children: N/A  . Years of Education: N/A   Occupational History  . Not on file.   Social History  Main Topics  . Smoking status: Never Smoker   . Smokeless tobacco: Not on file  . Alcohol Use: No  . Drug Use: No  . Sexual Activity: Yes   Other Topics Concern  . Not on file   Social History Narrative  . No narrative on file     Review of Systems  Constitutional: Positive for fatigue.  Psychiatric/Behavioral: Positive for suicidal ideas and sleep disturbance.   Objective:  Physical Exam  Vitals reviewed. Constitutional: She is oriented to person, place, and time. She appears well-developed and well-nourished.  HENT:  Head: Normocephalic and atraumatic.  Eyes: Conjunctivae and EOM are normal. Pupils are equal, round, and reactive to light.  Neck: Carotid bruit is not present. No thyromegaly present.  No nodules palpated.  Cardiovascular: Normal rate, regular rhythm, normal heart sounds and intact distal pulses.  Exam reveals no gallop and no friction rub.   No murmur heard. Pulmonary/Chest: Effort normal and breath sounds normal. No respiratory distress. She has no wheezes. She has no rales.  Abdominal: Soft. She exhibits no pulsatile midline mass. There is no tenderness.  Lymphadenopathy:    She has no cervical adenopathy.  Neurological: She is alert and oriented to person, place, and time.  Skin: Skin is warm and dry.  Psychiatric: Her speech is normal and behavior is normal. Judgment and thought content normal. Thought content is not paranoid. Cognition and memory are normal. She expresses no homicidal and no suicidal ideation.  Anxious appearing at times in history, but good eye contact. Appropriate responses.      Filed Vitals:   12/14/13 1305  BP: 134/78  Pulse: 91  Temp: 98.1 F (36.7 C)  TempSrc: Oral  Resp: 16  Height: 5\' 5"  (1.651 m)  Weight: 188 lb 14.1 oz (85.675 kg)  SpO2: 97%   PHQ-9 depression screening score of 15.    Spent approx 20 minutes in counseling - majority of visit with counseling.   Assessment & Plan:   Zoe Kim is a  68 y.o. female Depression with anxiety - Plan: FLUoxetine (PROZAC) 20 MG capsule, clonazePAM (KLONOPIN) 0.5 MG tablet  Memory difficulties  Increased social stressors, with prior depression.  Suspect some if not all memory difficulty due to depression recurrence. Denies active suicidal thoughts/intention/plan. Agrees to restart Prozac, will call counselor to schedule appt, klonopin BID prn and recheck in 3-4 weeks. If not improving memory with treatment of depression and anxiety symptoms, consider MMSE and other memory disorder evaluation. RTC/Er precautions and safety contracted. counseled on stress mgt. relaxation techniques, but this can also be discussed with therapist.  Recheck in 3-4 weeks. Sooner if worse.   INsomnia - klonopin as above or if minor sx's - can take ibuprofen pm.   Meds ordered this encounter  Medications  . FLUoxetine (PROZAC) 20 MG capsule    Sig: Take 1 capsule (20 mg total) by mouth daily.    Dispense:  30 capsule    Refill:  1  . clonazePAM (KLONOPIN) 0.5 MG tablet    Sig: Take 1 tablet (0.5 mg total) by mouth 2 (two) times daily as needed for anxiety.    Dispense:  30 tablet    Refill:  0   Patient Instructions  Restart Prozac once per day. Klonopin twice per day if needed. Follow up with me in 3-4 weeks, but if any worsening of symptoms - see me sooner or check in at [facility] emergency room.   Try calling Zoe Kim: [phone].  If no availability with her - let me know, and I can get you some other numbers.

## 2014-01-11 ENCOUNTER — Ambulatory Visit (INDEPENDENT_AMBULATORY_CARE_PROVIDER_SITE_OTHER): Payer: Medicare PPO | Admitting: Family Medicine

## 2014-01-11 ENCOUNTER — Encounter: Payer: Self-pay | Admitting: Family Medicine

## 2014-01-11 VITALS — BP 137/82 | HR 84 | Temp 98.3°F | Resp 16 | Ht 65.0 in | Wt 189.8 lb

## 2014-01-11 DIAGNOSIS — F341 Dysthymic disorder: Secondary | ICD-10-CM

## 2014-01-11 DIAGNOSIS — F418 Other specified anxiety disorders: Secondary | ICD-10-CM

## 2014-01-11 DIAGNOSIS — G4700 Insomnia, unspecified: Secondary | ICD-10-CM

## 2014-01-11 MED ORDER — FLUOXETINE HCL 20 MG PO CAPS: 20.0000 mg | ORAL_CAPSULE | Freq: Every day | ORAL | Status: DC

## 2014-01-11 NOTE — Progress Notes (Addendum)
Subjective:   This chart was scribed for [name], MD by Forrestine Him, [facility] Scribe. This patient was seen in room 25 and the patient's care was started 4:59 PM.    Patient ID: Zoe Kim, female    DOB: 05/21/1946, 68 y.o.   MRN: [id number]  Chief Complaint  Patient presents with  . medication review    feels like the prozac has helped with the depression     HPI  HPI Comments: Zoe Kim is a 68 y.o. female who presents to [facility] here for follow up today. Last seen 1 month ago diagnosed with recurrent depression with anxiety symptoms and some memory difficulty thought to be due to stress and depression. Has had some social stressors with work. Did have dysthymia. When seen last, pt was started on Prozac 20 mg QD and Clonazepam 0.5 mg PRN. She denies any side effects secondary to medications. However, states Clonazepam makes her "loopy" resulting in her only taking half a dose in the morning now. Last visit pt was also referred to [facility] for counseling. Stressors at work is very scattered, insurmountable, and overwhelming. However, states she is handling stress load better since last visit. She has not followed with recommended counselor just yet but plans to give her a call once she can figure out a a stable regimen for follow up weekly. Memory has improved since last seen. She admits her sleep patterns are thrown off when she sleeps at different times throughout the week. States she tends to wake from sleep around 3 AM with difficulty getting back to sleep. No SI/HI at this time. She denies any other new symptoms at this time. No other concerns this visit.   There are no active problems to display for this patient.  Past Medical History  Diagnosis Date  . Arthritis    Past Surgical History  Procedure Laterality Date  . Knee surgery    . Vein ligation and stripping    . Tonsillectomy     Allergies    Allergen Reactions  . Sulfa Antibiotics    Prior to Admission medications   Medication Sig Start Date End Date Taking? Authorizing Provider  clonazePAM (KLONOPIN) 0.5 MG tablet Take 1 tablet (0.5 mg total) by mouth 2 (two) times daily as needed for anxiety. 12/14/13  Yes [name], MD  FLUoxetine (PROZAC) 20 MG capsule Take 1 capsule (20 mg total) by mouth daily. 12/14/13  Yes [name], MD  ibuprofen (ADVIL,MOTRIN) 200 MG tablet Take 200 mg by mouth every 6 (six) hours as needed.   Yes Historical Provider, MD  nabumetone (RELAFEN) 750 MG tablet Take 750 mg by mouth 2 (two) times daily.    Historical Provider, MD   History   Social History  . Marital Status: Married    Spouse Name: N/A    Number of Children: N/A  . Years of Education: N/A   Occupational History  . Not on file.   Social History Main Topics  . Smoking status: Never Smoker   . Smokeless tobacco: Not on file  . Alcohol Use: No  . Drug Use: No  . Sexual Activity: Yes   Other Topics Concern  . Not on file   Social History Narrative  . No narrative on file     Review of Systems  Constitutional: Negative for fever and chills.  Psychiatric/Behavioral: Positive for sleep disturbance. Negative for suicidal ideas.  Filed Vitals:   01/11/14 1604  BP: 137/82  Pulse: 84  Temp: 98.3 F (36.8 C)  TempSrc: Oral  Resp: 16  Height: 5\' 5"  (1.651 m)  Weight: 189 lb 12.8 oz (86.093 kg)  SpO2: 96%    Objective:  Physical Exam  Nursing note and vitals reviewed. Constitutional: She is oriented to person, place, and time. She appears well-developed and well-nourished.  HENT:  Head: Normocephalic.  Eyes: EOM are normal.  Neck: Normal range of motion.  Cardiovascular: Normal rate, regular rhythm and normal heart sounds.   No murmur heard. Pulmonary/Chest: Effort normal and breath sounds normal.  Abdominal: She exhibits no distension.  Musculoskeletal: Normal range of motion.  Neurological: She is  alert and oriented to person, place, and time.  Psychiatric: She has a normal mood and affect. Her behavior is normal. Judgment and thought content normal. Cognition and memory are normal. She expresses no suicidal ideation.     Assessment & Plan:   Zoe Kim is a 68 y.o. female Depression with anxiety - Plan: FLUoxetine (PROZAC) 20 MG capsule  -improved in both anxiety sx's and memory.  Cont same dose of prozac, klonopin 1/2 - 1 BID prn, and will contact [facility] as planned.  If other name needed - encouraged to call.  Recheck in 2-3 months.   Insomnia  -sleep hygiene discussed, and handouts from [employer] given.   Meds ordered this encounter  Medications  . FLUoxetine (PROZAC) 20 MG capsule    Sig: Take 1 capsule (20 mg total) by mouth daily.    Dispense:  90 capsule    Refill:  0   Patient Instructions  Follow up with me in next 2-3 months.   No change in meds at this time.      I personally performed the services described in this documentation, which was scribed in my presence. The recorded information has been reviewed and is accurate.

## 2014-01-11 NOTE — Patient Instructions (Signed)
Follow up with me in next 2-3 months.   No change in meds at this time.

## 2014-01-21 ENCOUNTER — Other Ambulatory Visit: Payer: Self-pay | Admitting: Family Medicine

## 2014-01-21 NOTE — Telephone Encounter (Signed)
Changed sig to reflect plan at last 01/11/14 OV.

## 2014-01-21 NOTE — Telephone Encounter (Signed)
Thanks. Done and ready for pickup.

## 2014-01-22 NOTE — Telephone Encounter (Signed)
Faxed

## 2014-05-17 ENCOUNTER — Telehealth: Payer: Self-pay

## 2014-05-17 NOTE — Telephone Encounter (Signed)
LMVM reminding patient to have her flu shot.

## 2014-05-24 ENCOUNTER — Other Ambulatory Visit: Payer: Self-pay | Admitting: Family Medicine

## 2014-10-18 ENCOUNTER — Ambulatory Visit (INDEPENDENT_AMBULATORY_CARE_PROVIDER_SITE_OTHER): Payer: Commercial Managed Care - HMO | Admitting: Family Medicine

## 2014-10-18 ENCOUNTER — Encounter: Payer: Self-pay | Admitting: Family Medicine

## 2014-10-18 VITALS — BP 134/72 | HR 102 | Temp 98.5°F | Resp 16 | Ht 65.0 in | Wt 190.2 lb

## 2014-10-18 DIAGNOSIS — N3941 Urge incontinence: Secondary | ICD-10-CM

## 2014-10-18 DIAGNOSIS — F418 Other specified anxiety disorders: Secondary | ICD-10-CM | POA: Diagnosis not present

## 2014-10-18 DIAGNOSIS — M65312 Trigger thumb, left thumb: Secondary | ICD-10-CM

## 2014-10-18 LAB — POCT UA - MICROSCOPIC ONLY
BACTERIA, U MICROSCOPIC: NEGATIVE
CASTS, UR, LPF, POC: NEGATIVE
CRYSTALS, UR, HPF, POC: NEGATIVE
Epithelial cells, urine per micros: NEGATIVE
MUCUS UA: NEGATIVE

## 2014-10-18 LAB — POCT URINALYSIS DIPSTICK
Glucose, UA: NEGATIVE
LEUKOCYTES UA: NEGATIVE
Nitrite, UA: NEGATIVE
PH UA: 5
PROTEIN UA: NEGATIVE
RBC UA: NEGATIVE
Spec Grav, UA: 1.025
Urobilinogen, UA: 0.2

## 2014-10-18 MED ORDER — OXYBUTYNIN CHLORIDE 5 MG PO TABS: 2.5000 mg | ORAL_TABLET | Freq: Three times a day (TID) | ORAL | Status: DC

## 2014-10-18 MED ORDER — FLUOXETINE HCL 20 MG PO CAPS: 20.0000 mg | ORAL_CAPSULE | Freq: Every day | ORAL | Status: DC

## 2014-10-18 NOTE — Progress Notes (Addendum)
Subjective:  This chart was scribed for Zoe Ray, MD by Zoe Kim, Medical Scribe. This patient was seen in room 24 and the patient's care was started 2:36 PM.    Patient ID: Zoe Kim, female    DOB: [birth year], 69 y.o.   MRN: [id number]  HPI  Zoe Kim is a 69 y.o. female  Depression Her last visit was in August 2015.  She was started on Prozac in July 2015.  She was improving in August visit.  She was referred to counselling at the time but has not seen them at that time.  She was also having some insomnia and discussed sleep hygiene and handouts from [employer].  She takes Prozac 20 mg QD.  She also took Klonopin 0.5mg  BID as needed but doesn't take it anymore.  She has plan to follow up in 2-3 months.  She notes running out of Prozac since December 2015.  She describes feeling completely worn out.  She states riding horses again, going to beach, and not working 12 hours would help from being irritated. She reports getting frustrated, more on edge, and finds relief with Prozac. She denies alcohol and thoughts of suicide.   Left Thumb  She describes soreness in area of L thumb, and it makes popping sounds. NKI.  She has not taken medication for it. She states it has been happening for a couple weeks and has no similar symptoms in the past. No weakness.   Urinary incontinence She states having this since January 2016. She describes it as losing control and feels like urgency. No dysuria, no hematuria.  She believes water makes it worse and denies sneezing and cough causing loss of urine.  She states having this problem in the past and was told to lose weight. She rarely does Kegel exercises (few times per year).    There are no active problems to display for this patient.  Past Medical History  Diagnosis Date  . Arthritis    Past Surgical History  Procedure Laterality Date  . Knee surgery    . Vein ligation and stripping    .  Tonsillectomy     Allergies  Allergen Reactions  . Sulfa Antibiotics    Prior to Admission medications   Medication Sig Start Date End Date Taking? Authorizing Provider  clonazePAM (KLONOPIN) 0.5 MG tablet Take 1/2 to 1 tablet by mouth twice daily as needed. Patient not taking: Reported on 10/18/2014 01/21/14   [name], MD  FLUoxetine (PROZAC) 20 MG capsule Take 1 capsule (20 mg total) by mouth daily. Patient not taking: Reported on 10/18/2014 01/11/14   [name], MD  FLUoxetine (PROZAC) 20 MG capsule TAKE ONE CAPSULE BY MOUTH EVERY DAY.  "NEEDS OFFICE VISIT FOR ADDITIONAL REFILLS" Patient not taking: Reported on 10/18/2014 05/24/14   Daphane Shepherd, PA-C  ibuprofen (ADVIL,MOTRIN) 200 MG tablet Take 200 mg by mouth every 6 (six) hours as needed.   Yes Historical Provider, MD  nabumetone (RELAFEN) 750 MG tablet Take 750 mg by mouth 2 (two) times daily.    Historical Provider, MD   History   Social History  . Marital Status: Married    Spouse Name: N/A  . Number of Children: N/A  . Years of Education: N/A   Occupational History  . Not on file.   Social History Main Topics  . Smoking status: Never Smoker   . Smokeless tobacco: Not on file  . Alcohol Use: No  .  Drug Use: No  . Sexual Activity: Yes   Other Topics Concern  . Not on file   Social History Narrative      Review of Systems  Constitutional: Positive for fatigue.  Genitourinary: Positive for difficulty urinating. Negative for dysuria, hematuria, flank pain and pelvic pain.  Musculoskeletal: Positive for arthralgias.  Psychiatric/Behavioral: Negative for self-injury.       Objective:   Physical Exam  Constitutional: She is oriented to person, place, and time. She appears well-developed and well-nourished. No distress.  HENT:  Head: Normocephalic and atraumatic.  Mouth/Throat: Oropharynx is clear and moist. No oropharyngeal exudate.  Eyes: Pupils are equal, round, and reactive to light.  Neck:  Neck supple.  Cardiovascular: Normal rate, regular rhythm and normal heart sounds.   No murmur heard. Pulmonary/Chest: Effort normal and breath sounds normal. No respiratory distress. She has no wheezes. She has no rales.  Musculoskeletal: She exhibits no edema.  Left thumb volar slight tenderness to palpation with slight triggering but able to free without difficulty; Most tender at base of the thumb, at the first MCP   Neurological: She is alert and oriented to person, place, and time. No cranial nerve deficit.  Skin: Skin is dry. No rash noted.  Psychiatric: She has a normal mood and affect. Her behavior is normal.  Nursing note and vitals reviewed.   Filed Vitals:   10/18/14 1416  BP: 134/72  Pulse: 102  Temp: 98.5 F (36.9 C)  TempSrc: Oral  Resp: 16  Height: 5\' 5"  (1.651 m)  Weight: 190 lb 3.2 oz (86.274 kg)  SpO2: 96%   Results for orders placed or performed in visit on 10/18/14  POCT urinalysis dipstick  Result Value Ref Range   Color, UA Amber    Clarity, UA Clear    Glucose, UA Negative    Bilirubin, UA Small    Ketones, UA Trace    Spec Grav, UA 1.025    Kim, UA Negative    pH, UA 5.0    Protein, UA negative    Urobilinogen, UA 0.2    Nitrite, UA negative    Leukocytes, UA Negative   POCT UA - Microscopic Only  Result Value Ref Range   WBC, Ur, HPF, POC 0-2    RBC, urine, microscopic 2-5    Bacteria, U Microscopic Negative    Mucus, UA Negative    Epithelial cells, urine per micros Negative    Crystals, Ur, HPF, POC Negative    Casts, Ur, LPF, POC Negative    Yeast, UA         Assessment & Plan:   Zoe Kim is a 69 y.o. female Urge incontinence of urine - Plan: POCT urinalysis dipstick, POCT UA - Microscopic Only, oxybutynin (DITROPAN) 5 MG tablet  -possible component of bot stress and urge, but primarily urge by history  -kegel/progressive kegel exercises and bladder training discussed and handouts provided.   -trial of ditropan 1/2  tid prn. SED. Recheck in 3 months - sooner if new/worsening sx's.   Depression with anxiety - Plan: FLUoxetine (PROZAC) 20 MG capsule, DISCONTINUED: FLUoxetine (PROZAC) 20 MG capsule  -restart prozac.   Coping and stress mgt techniques discussed.   Trigger thumb of left hand  -trigger thumb vs 1st MCP osteoarthritis. Will try short term otc nsaid, otc brace with ROM few times per day, and recheck in next few weeks for possible trigger injection if not improved.   Meds ordered this encounter  . FLUoxetine (PROZAC)  20 MG capsule    Sig: Take 1 capsule (20 mg total) by mouth daily.    Dispense:  90 capsule    Refill:  1  . oxybutynin (DITROPAN) 5 MG tablet    Sig: Take 0.5 tablets (2.5 mg total) by mouth 3 (three) times daily.    Dispense:  45 tablet    Refill:  2   Patient Instructions  Restart prozac once per day. Make sure you schedule in time outside of work for activities you enjoy or that help with stress.   For the incontinence - see handout, work on Kegel exercises, and if needed - oxybutinin 1/2 tab three times per day.   Watch for lightheadedness or dry mouth with this med and follow up to discuss this further in next 3 months. Sooner if worse.   For your thumb- this may be trigger thumb, or some arthritis in that joint. Occasional advil or alleve for next 2-3 weeks, over the counter brace to lessen movement during the day, range of motion few times per day and recheck in 3 weeks if not improved as you may need an injection.   Return to the clinic or go to the nearest emergency room if any of your symptoms worsen or new symptoms occur.   Trigger Finger Trigger finger (digital tendinitis and stenosing tenosynovitis) is a common disorder that causes an often painful catching of the fingers or thumb. It occurs as a clicking, snapping, or locking of a finger in the palm of the hand. This is caused by a problem with the tendons that flex or bend the fingers sliding smoothly through  their sheaths. The condition may occur in any finger or a couple fingers at the same time.  The finger may lock with the finger curled or suddenly straighten out with a snap. This is more common in patients with rheumatoid arthritis and diabetes. Left untreated, the condition may get worse to the point where the finger becomes locked in flexion, like making a fist, or less commonly locked with the finger straightened out. CAUSES   Inflammation and scarring that lead to swelling around the tendon sheath.  Repeated or forceful movements.  Rheumatoid arthritis, an autoimmune disease that affects joints.  Gout.  Diabetes mellitus. SIGNS AND SYMPTOMS  Soreness and swelling of your finger.  A painful clicking or snapping as you bend and straighten your finger. DIAGNOSIS  Your health care provider will do a physical exam of your finger to diagnose trigger finger. TREATMENT   Splinting for 6-8 weeks may be helpful.  Nonsteroidal anti-inflammatory medicines (NSAIDs) can help to relieve the pain and inflammation.  Cortisone injections, along with splinting, may speed up recovery. Several injections may be required. Cortisone may give relief after one injection.  Surgery is another treatment that may be used if conservative treatments do not work. Surgery can be minor, without incisions (a cut does not have to be made), and can be done with a needle through the skin.  Other surgical choices involve an open procedure in which the surgeon opens the hand through a small incision and cuts the pulley so the tendon can again slide smoothly. Your hand will still work fine. HOME CARE INSTRUCTIONS  Apply ice to the injured area, twice per day:  Put ice in a plastic bag.  Place a towel between your skin and the bag.  Leave the ice on for 20 minutes, 3-4 times a day.  Rest your hand often. MAKE SURE YOU:  Understand these instructions.  Will watch your condition.  Will get help right away  if you are not doing well or get worse. Document Released: 03/10/2004 Document Revised: 01/21/2013 Document Reviewed: 10/21/2012 [facility] Patient Information 2015 [place], [place]. This information is not intended to replace advice given to you by your health care provider. Make sure you discuss any questions you have with your health care provider.   Urinary Incontinence Urinary incontinence is the involuntary loss of urine from your bladder. CAUSES  There are many causes of urinary incontinence. They include:  Medicines.  Infections.  Prostatic enlargement, leading to overflow of urine from your bladder.  Surgery.  Neurological diseases.  Emotional factors. SIGNS AND SYMPTOMS Urinary Incontinence can be divided into four types:  Urge incontinence. Urge incontinence is the involuntary loss of urine before you have the opportunity to go to the bathroom. There is a sudden urge to void but not enough time to reach a bathroom.  Stress incontinence. Stress incontinence is the sudden loss of urine with any activity that forces urine to pass. It is commonly caused by anatomical changes to the pelvis and sphincter areas of your body.  Overflow incontinence. Overflow incontinence is the loss of urine from an obstructed opening to your bladder. This results in a backup of urine and a resultant buildup of pressure within the bladder. When the pressure within the bladder exceeds the closing pressure of the sphincter, the urine overflows, which causes incontinence, similar to water overflowing a dam.  Total incontinence. Total incontinence is the loss of urine as a result of the inability to store urine within your bladder. DIAGNOSIS  Evaluating the cause of incontinence may require:  A thorough and complete medical and obstetric history.  A complete physical exam.  Laboratory tests such as a urine culture and sensitivities. When additional tests are indicated, they can include:  An  ultrasound exam.  Kidney and bladder X-rays.  Cystoscopy. This is an exam of the bladder using a narrow scope.  Urodynamic testing to test the nerve function to the bladder and sphincter areas. TREATMENT  Treatment for urinary incontinence depends on the cause:  For urge incontinence caused by a bacterial infection, antibiotics will be prescribed. If the urge incontinence is related to medicines you take, your health care provider may have you change the medicine.  For stress incontinence, surgery to re-establish anatomical support to the bladder or sphincter, or both, will often correct the condition.  For overflow incontinence caused by an enlarged prostate, an operation to open the channel through the enlarged prostate will allow the flow of urine out of the bladder. In women with fibroids, a hysterectomy may be recommended.  For total incontinence, surgery on your urinary sphincter may help. An artificial urinary sphincter (an inflatable cuff placed around the urethra) may be required. In women who have developed a hole-like passage between their bladder and vagina (vesicovaginal fistula), surgery to close the fistula often is required. HOME CARE INSTRUCTIONS  Normal daily hygiene and the use of pads or adult diapers that are changed regularly will help prevent odors and skin damage.  Avoid caffeine. It can overstimulate your bladder.  Use the bathroom regularly. Try about every 2-3 hours to go to the bathroom, even if you do not feel the need to do so. Take time to empty your bladder completely. After urinating, wait a minute. Then try to urinate again.  For causes involving nerve dysfunction, keep a log of the medicines you take and a  journal of the times you go to the bathroom. SEEK MEDICAL CARE IF:  You experience worsening of pain instead of improvement in pain after your procedure.  Your incontinence becomes worse instead of better. SEE IMMEDIATE MEDICAL CARE IF:  You  experience fever or shaking chills.  You are unable to pass your urine.  You have redness spreading into your groin or down into your thighs. MAKE SURE YOU:   Understand these instructions.   Will watch your condition.  Will get help right away if you are not doing well or get worse. Document Released: 06/28/2004 Document Revised: 03/11/2013 Document Reviewed: 10/28/2012 [facility] Patient Information 2015 [place], [place]. This information is not intended to replace advice given to you by your health care provider. Make sure you discuss any questions you have with your health care provider.         I personally performed the services described in this documentation, which was scribed in my presence. The recorded information has been reviewed and considered, and addended by me as needed.

## 2014-10-18 NOTE — Patient Instructions (Addendum)
Restart prozac once per day. Make sure you schedule in time outside of work for activities you enjoy or that help with stress.   For the incontinence - see handout, work on Kegel exercises, and if needed - oxybutinin 1/2 tab three times per day.   Watch for lightheadedness or dry mouth with this med and follow up to discuss this further in next 3 months. Sooner if worse.   For your thumb- this may be trigger thumb, or some arthritis in that joint. Occasional advil or alleve for next 2-3 weeks, over the counter brace to lessen movement during the day, range of motion few times per day and recheck in 3 weeks if not improved as you may need an injection.   Return to the clinic or go to the nearest emergency room if any of your symptoms worsen or new symptoms occur.   Trigger Finger Trigger finger (digital tendinitis and stenosing tenosynovitis) is a common disorder that causes an often painful catching of the fingers or thumb. It occurs as a clicking, snapping, or locking of a finger in the palm of the hand. This is caused by a problem with the tendons that flex or bend the fingers sliding smoothly through their sheaths. The condition may occur in any finger or a couple fingers at the same time.  The finger may lock with the finger curled or suddenly straighten out with a snap. This is more common in patients with rheumatoid arthritis and diabetes. Left untreated, the condition may get worse to the point where the finger becomes locked in flexion, like making a fist, or less commonly locked with the finger straightened out. CAUSES   Inflammation and scarring that lead to swelling around the tendon sheath.  Repeated or forceful movements.  Rheumatoid arthritis, an autoimmune disease that affects joints.  Gout.  Diabetes mellitus. SIGNS AND SYMPTOMS  Soreness and swelling of your finger.  A painful clicking or snapping as you bend and straighten your finger. DIAGNOSIS  Your health care  provider will do a physical exam of your finger to diagnose trigger finger. TREATMENT   Splinting for 6-8 weeks may be helpful.  Nonsteroidal anti-inflammatory medicines (NSAIDs) can help to relieve the pain and inflammation.  Cortisone injections, along with splinting, may speed up recovery. Several injections may be required. Cortisone may give relief after one injection.  Surgery is another treatment that may be used if conservative treatments do not work. Surgery can be minor, without incisions (a cut does not have to be made), and can be done with a needle through the skin.  Other surgical choices involve an open procedure in which the surgeon opens the hand through a small incision and cuts the pulley so the tendon can again slide smoothly. Your hand will still work fine. HOME CARE INSTRUCTIONS  Apply ice to the injured area, twice per day:  Put ice in a plastic bag.  Place a towel between your skin and the bag.  Leave the ice on for 20 minutes, 3-4 times a day.  Rest your hand often. MAKE SURE YOU:   Understand these instructions.  Will watch your condition.  Will get help right away if you are not doing well or get worse. Document Released: 03/10/2004 Document Revised: 01/21/2013 Document Reviewed: 10/21/2012 [facility] Patient Information 2015 [place], [place]. This information is not intended to replace advice given to you by your health care provider. Make sure you discuss any questions you have with your health care provider.  Urinary Incontinence Urinary incontinence is the involuntary loss of urine from your bladder. CAUSES  There are many causes of urinary incontinence. They include:  Medicines.  Infections.  Prostatic enlargement, leading to overflow of urine from your bladder.  Surgery.  Neurological diseases.  Emotional factors. SIGNS AND SYMPTOMS Urinary Incontinence can be divided into four types:  Urge incontinence. Urge incontinence is the  involuntary loss of urine before you have the opportunity to go to the bathroom. There is a sudden urge to void but not enough time to reach a bathroom.  Stress incontinence. Stress incontinence is the sudden loss of urine with any activity that forces urine to pass. It is commonly caused by anatomical changes to the pelvis and sphincter areas of your body.  Overflow incontinence. Overflow incontinence is the loss of urine from an obstructed opening to your bladder. This results in a backup of urine and a resultant buildup of pressure within the bladder. When the pressure within the bladder exceeds the closing pressure of the sphincter, the urine overflows, which causes incontinence, similar to water overflowing a dam.  Total incontinence. Total incontinence is the loss of urine as a result of the inability to store urine within your bladder. DIAGNOSIS  Evaluating the cause of incontinence may require:  A thorough and complete medical and obstetric history.  A complete physical exam.  Laboratory tests such as a urine culture and sensitivities. When additional tests are indicated, they can include:  An ultrasound exam.  Kidney and bladder X-rays.  Cystoscopy. This is an exam of the bladder using a narrow scope.  Urodynamic testing to test the nerve function to the bladder and sphincter areas. TREATMENT  Treatment for urinary incontinence depends on the cause:  For urge incontinence caused by a bacterial infection, antibiotics will be prescribed. If the urge incontinence is related to medicines you take, your health care provider may have you change the medicine.  For stress incontinence, surgery to re-establish anatomical support to the bladder or sphincter, or both, will often correct the condition.  For overflow incontinence caused by an enlarged prostate, an operation to open the channel through the enlarged prostate will allow the flow of urine out of the bladder. In women with  fibroids, a hysterectomy may be recommended.  For total incontinence, surgery on your urinary sphincter may help. An artificial urinary sphincter (an inflatable cuff placed around the urethra) may be required. In women who have developed a hole-like passage between their bladder and vagina (vesicovaginal fistula), surgery to close the fistula often is required. HOME CARE INSTRUCTIONS  Normal daily hygiene and the use of pads or adult diapers that are changed regularly will help prevent odors and skin damage.  Avoid caffeine. It can overstimulate your bladder.  Use the bathroom regularly. Try about every 2-3 hours to go to the bathroom, even if you do not feel the need to do so. Take time to empty your bladder completely. After urinating, wait a minute. Then try to urinate again.  For causes involving nerve dysfunction, keep a log of the medicines you take and a journal of the times you go to the bathroom. SEEK MEDICAL CARE IF:  You experience worsening of pain instead of improvement in pain after your procedure.  Your incontinence becomes worse instead of better. SEE IMMEDIATE MEDICAL CARE IF:  You experience fever or shaking chills.  You are unable to pass your urine.  You have redness spreading into your groin or down into your thighs. MAKE  SURE YOU:   Understand these instructions.   Will watch your condition.  Will get help right away if you are not doing well or get worse. Document Released: 06/28/2004 Document Revised: 03/11/2013 Document Reviewed: 10/28/2012 [facility] Patient Information 2015 [place], [place]. This information is not intended to replace advice given to you by your health care provider. Make sure you discuss any questions you have with your health care provider.

## 2014-11-10 ENCOUNTER — Encounter: Payer: Self-pay | Admitting: *Deleted

## 2014-12-07 ENCOUNTER — Other Ambulatory Visit: Payer: Self-pay | Admitting: Family Medicine

## 2015-02-14 ENCOUNTER — Other Ambulatory Visit: Payer: Self-pay | Admitting: Family Medicine

## 2015-03-04 LAB — HM MAMMOGRAPHY

## 2015-03-16 ENCOUNTER — Telehealth: Payer: Self-pay

## 2015-03-16 DIAGNOSIS — F418 Other specified anxiety disorders: Secondary | ICD-10-CM

## 2015-03-16 NOTE — Telephone Encounter (Signed)
Dr Carlota Raspberry, do you want to authorize RFs through Dec?

## 2015-03-16 NOTE — Telephone Encounter (Signed)
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

## 2015-03-18 MED ORDER — FLUOXETINE HCL 20 MG PO CAPS: 20.0000 mg | ORAL_CAPSULE | Freq: Every day | ORAL | Status: DC

## 2015-03-18 NOTE — Telephone Encounter (Signed)
Okay to refill. I sent this in. Follow-up with me after December.

## 2015-03-18 NOTE — Telephone Encounter (Signed)
LMOM for pt w/Dr Greene's message.

## 2015-03-25 ENCOUNTER — Encounter: Payer: Self-pay | Admitting: Family Medicine

## 2015-03-27 ENCOUNTER — Encounter: Payer: Self-pay | Admitting: Radiology

## 2015-04-06 ENCOUNTER — Other Ambulatory Visit: Payer: Self-pay | Admitting: Family Medicine

## 2015-04-07 ENCOUNTER — Encounter: Payer: Self-pay | Admitting: Family Medicine

## 2015-07-02 ENCOUNTER — Other Ambulatory Visit: Payer: Self-pay | Admitting: Family Medicine

## 2015-07-03 NOTE — Telephone Encounter (Signed)
LM   What is plan needs a follow up for refills?

## 2015-07-09 ENCOUNTER — Other Ambulatory Visit: Payer: Self-pay | Admitting: Family Medicine

## 2015-07-27 ENCOUNTER — Encounter: Payer: Self-pay | Admitting: Family Medicine

## 2015-07-27 ENCOUNTER — Ambulatory Visit (INDEPENDENT_AMBULATORY_CARE_PROVIDER_SITE_OTHER): Payer: Commercial Managed Care - HMO | Admitting: Family Medicine

## 2015-07-27 VITALS — BP 120/72 | HR 83 | Temp 98.5°F | Resp 16 | Ht 65.25 in | Wt 195.4 lb

## 2015-07-27 DIAGNOSIS — F418 Other specified anxiety disorders: Secondary | ICD-10-CM

## 2015-07-27 DIAGNOSIS — R05 Cough: Secondary | ICD-10-CM | POA: Diagnosis not present

## 2015-07-27 DIAGNOSIS — N3941 Urge incontinence: Secondary | ICD-10-CM | POA: Diagnosis not present

## 2015-07-27 DIAGNOSIS — Z23 Encounter for immunization: Secondary | ICD-10-CM | POA: Diagnosis not present

## 2015-07-27 DIAGNOSIS — R059 Cough, unspecified: Secondary | ICD-10-CM

## 2015-07-27 DIAGNOSIS — Z1211 Encounter for screening for malignant neoplasm of colon: Secondary | ICD-10-CM

## 2015-07-27 DIAGNOSIS — N6012 Diffuse cystic mastopathy of left breast: Secondary | ICD-10-CM

## 2015-07-27 MED ORDER — OXYBUTYNIN CHLORIDE 5 MG PO TABS: ORAL_TABLET | ORAL | Status: DC

## 2015-07-27 MED ORDER — AZITHROMYCIN 250 MG PO TABS: ORAL_TABLET | ORAL | Status: DC

## 2015-07-27 MED ORDER — SERTRALINE HCL 50 MG PO TABS: 50.0000 mg | ORAL_TABLET | Freq: Every day | ORAL | Status: DC

## 2015-07-27 NOTE — Patient Instructions (Signed)
Try mucinex or mucinex DM for cough, zyrtec or allegra once per day and if cough not improving in next 2 weeks - can start antibiotic (zpak).  Return to the clinic or go to the nearest emergency room if any of your symptoms worsen or new symptoms occur.  Try changing to Zoloft once per day. Follow up in next 3 months, or let me know sooner if this medicine is not working well for you. Continue exercise.   I will refer you to gastroenterologist to discuss whether or not colonoscopy is needed.   Continue ditropan as needed.   If finger is swelling or red - return for recheck and possible xray.   Cough, Adult Coughing is a reflex that clears your throat and your airways. Coughing helps to heal and protect your lungs. It is normal to cough occasionally, but a cough that happens with other symptoms or lasts a long time may be a sign of a condition that needs treatment. A cough may last only 2-3 weeks (acute), or it may last longer than 8 weeks (chronic). CAUSES Coughing is commonly caused by:  Breathing in substances that irritate your lungs.  A viral or bacterial respiratory infection.  Allergies.  Asthma.  Postnasal drip.  Smoking.  Acid backing up from the stomach into the esophagus (gastroesophageal reflux).  Certain medicines.  Chronic lung problems, including COPD (or rarely, lung cancer).  Other medical conditions such as heart failure. HOME CARE INSTRUCTIONS  Pay attention to any changes in your symptoms. Take these actions to help with your discomfort:  Take medicines only as told by your health care provider.  If you were prescribed an antibiotic medicine, take it as told by your health care provider. Do not stop taking the antibiotic even if you start to feel better.  Talk with your health care provider before you take a cough suppressant medicine.  Drink enough fluid to keep your urine clear or pale yellow.  If the air is dry, use a cold steam vaporizer or  humidifier in your bedroom or your home to help loosen secretions.  Avoid anything that causes you to cough at work or at home.  If your cough is worse at night, try sleeping in a semi-upright position.  Avoid cigarette smoke. If you smoke, quit smoking. If you need help quitting, ask your health care provider.  Avoid caffeine.  Avoid alcohol.  Rest as needed. SEEK MEDICAL CARE IF:   You have new symptoms.  You cough up pus.  Your cough does not get better after 2-3 weeks, or your cough gets worse.  You cannot control your cough with suppressant medicines and you are losing sleep.  You develop pain that is getting worse or pain that is not controlled with pain medicines.  You have a fever.  You have unexplained weight loss.  You have night sweats. SEEK IMMEDIATE MEDICAL CARE IF:  You cough up blood.  You have difficulty breathing.  Your heartbeat is very fast.   This information is not intended to replace advice given to you by your health care provider. Make sure you discuss any questions you have with your health care provider.   Document Released: 11/17/2010 Document Revised: 02/09/2015 Document Reviewed: 07/28/2014 Elsevier Interactive Patient Education [employer].

## 2015-07-27 NOTE — Progress Notes (Signed)
Subjective:    Patient ID: Zoe Kim, female    DOB: April 12, 1946, 70 y.o.   MRN: [id number] By signing my name below, I, [name], attest that this documentation has been prepared under the direction and in the presence of [name], MD.  Electronically Signed: [name], Medical Scribe. 07/27/2015. 9:50 AM.  HPI HPI Comments: Zoe Kim is a 70 y.o. female who presents to the [facility] for a follow-up for multiple concerns.  Cough: Patient has had a waxing and waning, persistent cough for about a month. She has not tried any medications for this. 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 Patient denies fever, heartburn, rhinorrhea, and congestion. She has tried Mucinex for previous illnesses.  Depression with anxiety: On Prozac 20 mg qd. Klonopin 0.5 mg BID prn. Last discussed in May of last year. At that visit, she had been off of Prozac, advised to restart 20 mg qd. She attributes her symptoms to financial stressors. Patient does not like the Prozac because she does not like the way it makes her feel. She feels it makes her "physically depressed" and would like to try another medication. Patient has been riding horses and also joined a gym earlier this week. She does not want to meet with a therapist. She denies SI/HI.  Urinary incontinence: See last visit. Suspected urge +/- stress component. Kegel exercises given. Prescription for Ditropan 5 mg given, 1/2 TID prn. She has been taking the Ditropan (1 in the morning, 1/2 in the evening) which has been providing relief to her symptoms.  Finger swelling, right 3rd finger: Patient also reports having some issues with her right 3rd finger. She has noticed some swelling to the area, which she believes is due to an old splinter.  Health maintenance:  Colon cancer screening - Patient does not remember where her last colonoscopy was done, but she believes it was done around  the age of 56. She thinks she was told she did not need to have them anymore or as often, but she agrees to discuss this with GI. Breast cancer screening - She had a mammogram in September 2016. Was an abnormality on the September image. Had an [place] also performed, noted to be fibrocystic tissue on left breast, probably benign. Recommended follow-up mammogram and [place] in 6 months. She will schedule this in April and does not need a referral.   There are no active problems to display for this patient.  Past Medical History  Diagnosis Date  . Arthritis    Past Surgical History  Procedure Laterality Date  . Knee surgery    . Vein ligation and stripping    . Tonsillectomy     Allergies  Allergen Reactions  . Sulfa Antibiotics    Prior to Admission medications   Medication Sig Start Date End Date Taking? Authorizing Provider  FLUoxetine (PROZAC) 20 MG capsule TAKE 1 CAPSULE (20 MG TOTAL) BY MOUTH DAILY. 07/11/15  Yes [name], MD  ibuprofen (ADVIL,MOTRIN) 200 MG tablet Take 200 mg by mouth every 6 (six) hours as needed.   Yes Historical Provider, MD  oxybutynin (DITROPAN) 5 MG tablet TAKE 1/2 TABLET BY MOUTH 3 TIMES A DAY 07/08/15  Yes [name], MD  clonazePAM (KLONOPIN) 0.5 MG tablet Take 1/2 to 1 tablet by mouth twice daily as needed. Patient not taking: Reported on 10/18/2014 01/21/14   [name], MD  nabumetone (RELAFEN) 750  MG tablet Take 750 mg by mouth 2 (two) times daily. Reported on 07/27/2015    Historical Provider, MD  oxybutynin (DITROPAN) 5 MG tablet TAKE 1/2 TABLET BY MOUTH 3 TIMES A DAY Patient not taking: Reported on 07/27/2015 02/15/15   Mancel Bale, PA-C   Social History   Social History  . Marital Status: Married    Spouse Name: N/A  . Number of Children: N/A  . Years of Education: N/A   Occupational History  . Not on file.   Social History Main Topics  . Smoking status: Never Smoker   . Smokeless tobacco: Not on file  . Alcohol Use: No  . Drug  Use: No  . Sexual Activity: Yes   Other Topics Concern  . Not on file   Social History Narrative     Review of Systems  Constitutional: Negative for fever.  HENT: Negative for congestion and rhinorrhea.   Respiratory: Positive for cough.   Psychiatric/Behavioral: Positive for dysphoric mood. Negative for suicidal ideas.       Objective:   Physical Exam  Constitutional: She is oriented to person, place, and time. She appears well-developed and well-nourished. No distress.  HENT:  Head: Normocephalic and atraumatic.  Right Ear: Hearing, tympanic membrane, external ear and ear canal normal.  Left Ear: Hearing, tympanic membrane, external ear and ear canal normal.  Nose: Nose normal.  Mouth/Throat: Oropharynx is clear and moist. No oropharyngeal exudate.  Eyes: Conjunctivae and EOM are normal. Pupils are equal, round, and reactive to light.  Cardiovascular: Normal rate, regular rhythm, normal heart sounds and intact distal pulses.   No murmur heard. Pulmonary/Chest: Effort normal and breath sounds normal. No respiratory distress. She has no wheezes. She has no rhonchi.  Musculoskeletal:  Right 3rd finger: scar over the distal aspect of phalanx, slightly misshapen nail.  Neurological: She is alert and oriented to person, place, and time.  Skin: Skin is warm and dry. No rash noted.  Psychiatric: She has a normal mood and affect. Her behavior is normal.  Vitals reviewed.   Filed Vitals:   07/27/15 0928  BP: 120/72  Pulse: 83  Temp: 98.5 F (36.9 C)  TempSrc: Oral  Resp: 16  Height: 5' 5.25" (1.657 m)  Weight: 195 lb 6.4 oz (88.633 kg)  SpO2: 97%         Assessment & Plan:   Zoe Kim is a 70 y.o. female Depression with anxiety - Plan: sertraline (ZOLOFT) 50 MG tablet  Basal symptoms above, will try change to another SSRI. Start Zoloft 50 mg daily. Discussed initial side effects, but to determine control would want to see 4-6 weeks on medication.  Require  benzodiazepine this time. Declined CBT/counseling. Commended on restarting exercise as this should also help with her symptoms, and horseback riding may also help with management of stress. Recheck control in the next 3 months.  Fibrocystic changes of left breast - Plan: MM Digital Screening, US BREAST LTD UNI LEFT INC AXILLA  -she plans on scheduling ultrasound and mammogram.   Cough - Plan: azithromycin (ZITHROMAX) 250 MG tablet  - Allergic rhinitis versus persistent cough after viral illness versus lower respiratory tract infection/bronchitis. Trial of Zyrtec or Allegra over-the-counter, Mucinex, and if not improving, prescription for Z-Pak given. Side effects discussed. RTC precautions.   Special screening for malignant neoplasms, colon - Plan: Ambulatory referral to Gastroenterology  - May still be a candidate for colonoscopy, can discuss with GI. Referral placed  Needs flu shot -  Plan: Flu Vaccine QUAD 36+ mos IM given  Urge incontinence - Plan: oxybutynin (DITROPAN) 5 MG tablet  -Controlled with episodic use of Ditropan. Denies side effects. Refill given.  Meds ordered this encounter  Medications  . azithromycin (ZITHROMAX) 250 MG tablet    Sig: Take 2 pills by mouth on day 1, then 1 pill by mouth per day on days 2 through 5.    Dispense:  6 tablet    Refill:  0  . sertraline (ZOLOFT) 50 MG tablet    Sig: Take 1 tablet (50 mg total) by mouth daily.    Dispense:  30 tablet    Refill:  3  . oxybutynin (DITROPAN) 5 MG tablet    Sig: TAKE 1/2 TABLET BY MOUTH 3 TIMES A DAY as needed.    Dispense:  90 tablet    Refill:  3   Patient Instructions  Try mucinex or mucinex DM for cough, zyrtec or allegra once per day and if cough not improving in next 2 weeks - can start antibiotic (zpak).  Return to the clinic or go to the nearest emergency room if any of your symptoms worsen or new symptoms occur.  Try changing to Zoloft once per day. Follow up in next 3 months, or let me know sooner  if this medicine is not working well for you. Continue exercise.   I will refer you to gastroenterologist to discuss whether or not colonoscopy is needed.   Continue ditropan as needed.   If finger is swelling or red - return for recheck and possible xray.   Cough, Adult Coughing is a reflex that clears your throat and your airways. Coughing helps to heal and protect your lungs. It is normal to cough occasionally, but a cough that happens with other symptoms or lasts a long time may be a sign of a condition that needs treatment. A cough may last only 2-3 weeks (acute), or it may last longer than 8 weeks (chronic). CAUSES Coughing is commonly caused by:  Breathing in substances that irritate your lungs.  A viral or bacterial respiratory infection.  Allergies.  Asthma.  Postnasal drip.  Smoking.  Acid backing up from the stomach into the esophagus (gastroesophageal reflux).  Certain medicines.  Chronic lung problems, including COPD (or rarely, lung cancer).  Other medical conditions such as heart failure. HOME CARE INSTRUCTIONS  Pay attention to any changes in your symptoms. Take these actions to help with your discomfort:  Take medicines only as told by your health care provider.  If you were prescribed an antibiotic medicine, take it as told by your health care provider. Do not stop taking the antibiotic even if you start to feel better.  Talk with your health care provider before you take a cough suppressant medicine.  Drink enough fluid to keep your urine clear or pale yellow.  If the air is dry, use a cold steam vaporizer or humidifier in your bedroom or your home to help loosen secretions.  Avoid anything that causes you to cough at work or at home.  If your cough is worse at night, try sleeping in a semi-upright position.  Avoid cigarette smoke. If you smoke, quit smoking. If you need help quitting, ask your health care provider.  Avoid caffeine.  Avoid  alcohol.  Rest as needed. SEEK MEDICAL CARE IF:   You have new symptoms.  You cough up pus.  Your cough does not get better after 2-3 weeks, or your cough gets worse.  You cannot control your cough with suppressant medicines and you are losing sleep.  You develop pain that is getting worse or pain that is not controlled with pain medicines.  You have a fever.  You have unexplained weight loss.  You have night sweats. SEEK IMMEDIATE MEDICAL CARE IF:  You cough up blood.  You have difficulty breathing.  Your heartbeat is very fast.   This information is not intended to replace advice given to you by your health care provider. Make sure you discuss any questions you have with your health care provider.   Document Released: 11/17/2010 Document Revised: 02/09/2015 Document Reviewed: 07/28/2014 Elsevier Interactive Patient Education [employer].     I personally performed the services described in this documentation, which was scribed in my presence. The recorded information has been reviewed and considered, and addended by me as needed.

## 2015-09-14 ENCOUNTER — Encounter: Payer: Self-pay | Admitting: Family Medicine

## 2015-09-14 ENCOUNTER — Ambulatory Visit (INDEPENDENT_AMBULATORY_CARE_PROVIDER_SITE_OTHER): Payer: Commercial Managed Care - HMO | Admitting: Family Medicine

## 2015-09-14 ENCOUNTER — Ambulatory Visit (INDEPENDENT_AMBULATORY_CARE_PROVIDER_SITE_OTHER): Payer: Commercial Managed Care - HMO

## 2015-09-14 VITALS — BP 120/72 | HR 93 | Temp 98.3°F | Resp 16 | Ht 65.0 in | Wt 191.6 lb

## 2015-09-14 DIAGNOSIS — R059 Cough, unspecified: Secondary | ICD-10-CM

## 2015-09-14 DIAGNOSIS — F32A Depression, unspecified: Secondary | ICD-10-CM

## 2015-09-14 DIAGNOSIS — J309 Allergic rhinitis, unspecified: Secondary | ICD-10-CM | POA: Diagnosis not present

## 2015-09-14 DIAGNOSIS — F329 Major depressive disorder, single episode, unspecified: Secondary | ICD-10-CM

## 2015-09-14 DIAGNOSIS — R05 Cough: Secondary | ICD-10-CM

## 2015-09-14 DIAGNOSIS — M25512 Pain in left shoulder: Secondary | ICD-10-CM | POA: Diagnosis not present

## 2015-09-14 DIAGNOSIS — G6289 Other specified polyneuropathies: Secondary | ICD-10-CM

## 2015-09-14 LAB — CBC
HCT: 39.2 % (ref 35.0–45.0)
Hemoglobin: 13.2 g/dL (ref 11.7–15.5)
MCH: 28.9 pg (ref 27.0–33.0)
MCHC: 33.7 g/dL (ref 32.0–36.0)
MCV: 85.8 fL (ref 80.0–100.0)
MPV: 10.2 fL (ref 7.5–12.5)
PLATELETS: 286 10*3/uL (ref 140–400)
RBC: 4.57 MIL/uL (ref 3.80–5.10)
RDW: 13.7 % (ref 11.0–15.0)
WBC: 8.6 10*3/uL (ref 3.8–10.8)

## 2015-09-14 LAB — GLUCOSE, POCT (MANUAL RESULT ENTRY): POC GLUCOSE: 115 mg/dL — AB (ref 70–99)

## 2015-09-14 LAB — POCT GLYCOSYLATED HEMOGLOBIN (HGB A1C): HEMOGLOBIN A1C: 5.5

## 2015-09-14 MED ORDER — FEXOFENADINE HCL 180 MG PO TABS: 180.0000 mg | ORAL_TABLET | Freq: Every day | ORAL | Status: DC

## 2015-09-14 MED ORDER — FLUOXETINE HCL 20 MG PO TABS: 20.0000 mg | ORAL_TABLET | Freq: Every day | ORAL | Status: DC

## 2015-09-14 NOTE — Patient Instructions (Addendum)
IF you received an x-ray today, you will receive an invoice from [facility]. Please contact [facility] at [phone] with questions or concerns regarding your invoice.   IF you received labwork today, you will receive an invoice from [employer]. Please contact [facility] at [phone] with questions or concerns regarding your invoice.   Our billing staff will not be able to assist you with questions regarding bills from these companies.  You will be contacted with the lab results as soon as they are available. The fastest way to get your results is to activate your My Chart account. Instructions are located on the last page of this paperwork. If you have not heard from [place] regarding the results in 2 weeks, please contact this office.    I will start with some blood tests for the tingling in feet and hands, but if these are normal, would recommend evaluation with neurologist. I can refer you if needed.   Continue range of motion for your shoulder (see exercises below), avoid overuse, tylenol if needed and if not improving in next 6 weeks - return for recheck  Allegra or claritin once per day for cough/allergies. Flonase nasal spray can also help. If cough not resolving in next 6 weeks - can look at other causes. Return to the clinic or go to the nearest emergency room if any of your symptoms worsen or new symptoms occur.  Restart prozac, start back up with exercise and recheck in the next 2 months to see how this is going.   Cough, Adult Coughing is a reflex that clears your throat and your airways. Coughing helps to heal and protect your lungs. It is normal to cough occasionally, but a cough that happens with other symptoms or lasts a long time may be a sign of a condition that needs treatment. A cough may last only 2-3 weeks (acute), or it may last longer than 8 weeks (chronic). CAUSES Coughing is commonly caused by:  Breathing in  substances that irritate your lungs.  A viral or bacterial respiratory infection.  Allergies.  Asthma.  Postnasal drip.  Smoking.  Acid backing up from the stomach into the esophagus (gastroesophageal reflux).  Certain medicines.  Chronic lung problems, including COPD (or rarely, lung cancer).  Other medical conditions such as heart failure. HOME CARE INSTRUCTIONS  Pay attention to any changes in your symptoms. Take these actions to help with your discomfort:  Take medicines only as told by your health care provider.  If you were prescribed an antibiotic medicine, take it as told by your health care provider. Do not stop taking the antibiotic even if you start to feel better.  Talk with your health care provider before you take a cough suppressant medicine.  Drink enough fluid to keep your urine clear or pale yellow.  If the air is dry, use a cold steam vaporizer or humidifier in your bedroom or your home to help loosen secretions.  Avoid anything that causes you to cough at work or at home.  If your cough is worse at night, try sleeping in a semi-upright position.  Avoid cigarette smoke. If you smoke, quit smoking. If you need help quitting, ask your health care provider.  Avoid caffeine.  Avoid alcohol.  Rest as needed. SEEK MEDICAL CARE IF:   You have new symptoms.  You cough up pus.  Your cough does not get better after 2-3 weeks, or your cough gets worse.  You cannot control  your cough with suppressant medicines and you are losing sleep.  You develop pain that is getting worse or pain that is not controlled with pain medicines.  You have a fever.  You have unexplained weight loss.  You have night sweats. SEEK IMMEDIATE MEDICAL CARE IF:  You cough up blood.  You have difficulty breathing.  Your heartbeat is very fast.   This information is not intended to replace advice given to you by your health care provider. Make sure you discuss any  questions you have with your health care provider.   Document Released: 11/17/2010 Document Revised: 02/09/2015 Document Reviewed: 07/28/2014 Elsevier Interactive Patient Education 2016 [employer]    Impingement Syndrome, Rotator Cuff, Bursitis With Rehab Impingement syndrome is a condition that involves inflammation of the tendons of the rotator cuff and the subacromial bursa, that causes pain in the shoulder. The rotator cuff consists of four tendons and muscles that control much of the shoulder and upper arm function. The subacromial bursa is a fluid filled sac that helps reduce friction between the rotator cuff and one of the bones of the shoulder (acromion). Impingement syndrome is usually an overuse injury that causes swelling of the bursa (bursitis), swelling of the tendon (tendonitis), and/or a tear of the tendon (strain). Strains are classified into three categories. Grade 1 strains cause pain, but the tendon is not lengthened. Grade 2 strains include a lengthened ligament, due to the ligament being stretched or partially ruptured. With grade 2 strains there is still function, although the function may be decreased. Grade 3 strains include a complete tear of the tendon or muscle, and function is usually impaired. SYMPTOMS   Pain around the shoulder, often at the outer portion of the upper arm.  Pain that gets worse with shoulder function, especially when reaching overhead or lifting.  Sometimes, aching when not using the arm.  Pain that wakes you up at night.  Sometimes, tenderness, swelling, warmth, or redness over the affected area.  Loss of strength.  Limited motion of the shoulder, especially reaching behind the back (to the back pocket or to unhook bra) or across your body.  Crackling sound (crepitation) when moving the arm.  Biceps tendon pain and inflammation (in the front of the shoulder). Worse when bending the elbow or lifting. CAUSES  Impingement syndrome is often  an overuse injury, in which chronic (repetitive) motions cause the tendons or bursa to become inflamed. A strain occurs when a force is paced on the tendon or muscle that is greater than it can withstand. Common mechanisms of injury include: Stress from sudden increase in duration, frequency, or intensity of training.  Direct hit (trauma) to the shoulder.  Aging, erosion of the tendon with normal use.  Bony bump on shoulder (acromial spur). RISK INCREASES WITH:  Contact sports (football, wrestling, boxing).  Throwing sports (baseball, tennis, volleyball).  Weightlifting and bodybuilding.  Heavy labor.  Previous injury to the rotator cuff, including impingement.  Poor shoulder strength and flexibility.  Failure to warm up properly before activity.  Inadequate protective equipment.  Old age.  Bony bump on shoulder (acromial spur). PREVENTION   Warm up and stretch properly before activity.  Allow for adequate recovery between workouts.  Maintain physical fitness:  Strength, flexibility, and endurance.  Cardiovascular fitness.  Learn and use proper exercise technique. PROGNOSIS  If treated properly, impingement syndrome usually goes away within 6 weeks. Sometimes surgery is required.  RELATED COMPLICATIONS   Longer healing time if not properly treated,  or if not given enough time to heal.  Recurring symptoms, that result in a chronic condition.  Shoulder stiffness, frozen shoulder, or loss of motion.  Rotator cuff tendon tear.  Recurring symptoms, especially if activity is resumed too soon, with overuse, with a direct blow, or when using poor technique. TREATMENT  Treatment first involves the use of ice and medicine, to reduce pain and inflammation. The use of strengthening and stretching exercises may help reduce pain with activity. These exercises may be performed at home or with a therapist. If non-surgical treatment is unsuccessful after more than 6 months,  surgery may be advised. After surgery and rehabilitation, activity is usually possible in 3 months.  MEDICATION  If pain medicine is needed, nonsteroidal anti-inflammatory medicines (aspirin and ibuprofen), or other minor pain relievers (acetaminophen), are often advised.  Do not take pain medicine for 7 days before surgery.  Prescription pain relievers may be given, if your caregiver thinks they are needed. Use only as directed and only as much as you need.  Corticosteroid injections may be given by your caregiver. These injections should be reserved for the most serious cases, because they may only be given a certain number of times. HEAT AND COLD  Cold treatment (icing) should be applied for 10 to 15 minutes every 2 to 3 hours for inflammation and pain, and immediately after activity that aggravates your symptoms. Use ice packs or an ice massage.  Heat treatment may be used before performing stretching and strengthening activities prescribed by your caregiver, physical therapist, or athletic trainer. Use a heat pack or a warm water soak. SEEK MEDICAL CARE IF:   Symptoms get worse or do not improve in 4 to 6 weeks, despite treatment.  New, unexplained symptoms develop. (Drugs used in treatment may produce side effects.) EXERCISES  RANGE OF MOTION (ROM) AND STRETCHING EXERCISES - Impingement Syndrome (Rotator Cuff  Tendinitis, Bursitis) These exercises may help you when beginning to rehabilitate your injury. Your symptoms may go away with or without further involvement from your physician, physical therapist or athletic trainer. While completing these exercises, remember:   Restoring tissue flexibility helps normal motion to return to the joints. This allows healthier, less painful movement and activity.  An effective stretch should be held for at least 30 seconds.  A stretch should never be painful. You should only feel a gentle lengthening or release in the stretched tissue. STRETCH  - Flexion, Standing  Stand with good posture. With an underhand grip on your right / left hand, and an overhand grip on the opposite hand, grasp a broomstick or cane so that your hands are a little more than shoulder width apart.  Keeping your right / left elbow straight and shoulder muscles relaxed, push the stick with your opposite hand, to raise your right / left arm in front of your body and then overhead. Raise your arm until you feel a stretch in your right / left shoulder, but before you have increased shoulder pain.  Try to avoid shrugging your right / left shoulder as your arm rises, by keeping your shoulder blade tucked down and toward your mid-back spine. Hold for __________ seconds.  Slowly return to the starting position. Repeat __________ times. Complete this exercise __________ times per day. STRETCH - Abduction, Supine  Lie on your back. With an underhand grip on your right / left hand and an overhand grip on the opposite hand, grasp a broomstick or cane so that your hands are a little more  than shoulder width apart.  Keeping your right / left elbow straight and your shoulder muscles relaxed, push the stick with your opposite hand, to raise your right / left arm out to the side of your body and then overhead. Raise your arm until you feel a stretch in your right / left shoulder, but before you have increased shoulder pain.  Try to avoid shrugging your right / left shoulder as your arm rises, by keeping your shoulder blade tucked down and toward your mid-back spine. Hold for __________ seconds.  Slowly return to the starting position. Repeat __________ times. Complete this exercise __________ times per day. ROM - Flexion, Active-Assisted  Lie on your back. You may bend your knees for comfort.  Grasp a broomstick or cane so your hands are about shoulder width apart. Your right / left hand should grip the end of the stick, so that your hand is positioned "thumbs-up," as if you  were about to shake hands.  Using your healthy arm to lead, raise your right / left arm overhead, until you feel a gentle stretch in your shoulder. Hold for __________ seconds.  Use the stick to assist in returning your right / left arm to its starting position. Repeat __________ times. Complete this exercise __________ times per day.  ROM - Internal Rotation, Supine   Lie on your back on a firm surface. Place your right / left elbow about 60 degrees away from your side. Elevate your elbow with a folded towel, so that the elbow and shoulder are the same height.  Using a broomstick or cane and your strong arm, pull your right / left hand toward your body until you feel a gentle stretch, but no increase in your shoulder pain. Keep your shoulder and elbow in place throughout the exercise.  Hold for __________ seconds. Slowly return to the starting position. Repeat __________ times. Complete this exercise __________ times per day. STRETCH - Internal Rotation  Place your right / left hand behind your back, palm up.  Throw a towel or belt over your opposite shoulder. Grasp the towel with your right / left hand.  While keeping an upright posture, gently pull up on the towel, until you feel a stretch in the front of your right / left shoulder.  Avoid shrugging your right / left shoulder as your arm rises, by keeping your shoulder blade tucked down and toward your mid-back spine.  Hold for __________ seconds. Release the stretch, by lowering your healthy hand. Repeat __________ times. Complete this exercise __________ times per day. ROM - Internal Rotation   Using an underhand grip, grasp a stick behind your back with both hands.  While standing upright with good posture, slide the stick up your back until you feel a mild stretch in the front of your shoulder.  Hold for __________ seconds. Slowly return to your starting position. Repeat __________ times. Complete this exercise __________  times per day.  STRETCH - Posterior Shoulder Capsule   Stand or sit with good posture. Grasp your right / left elbow and draw it across your chest, keeping it at the same height as your shoulder.  Pull your elbow, so your upper arm comes in closer to your chest. Pull until you feel a gentle stretch in the back of your shoulder.  Hold for __________ seconds. Repeat __________ times. Complete this exercise __________ times per day. STRENGTHENING EXERCISES - Impingement Syndrome (Rotator Cuff Tendinitis, Bursitis) These exercises may help you when beginning to rehabilitate your  injury. They may resolve your symptoms with or without further involvement from your physician, physical therapist or athletic trainer. While completing these exercises, remember:  Muscles can gain both the endurance and the strength needed for everyday activities through controlled exercises.  Complete these exercises as instructed by your physician, physical therapist or athletic trainer. Increase the resistance and repetitions only as guided.  You may experience muscle soreness or fatigue, but the pain or discomfort you are trying to eliminate should never worsen during these exercises. If this pain does get worse, stop and make sure you are following the directions exactly. If the pain is still present after adjustments, discontinue the exercise until you can discuss the trouble with your clinician.  During your recovery, avoid activity or exercises which involve actions that place your injured hand or elbow above your head or behind your back or head. These positions stress the tissues which you are trying to heal. STRENGTH - Scapular Depression and Adduction   With good posture, sit on a firm chair. Support your arms in front of you, with pillows, arm rests, or on a table top. Have your elbows in line with the sides of your body.  Gently draw your shoulder blades down and toward your mid-back spine. Gradually  increase the tension, without tensing the muscles along the top of your shoulders and the back of your neck.  Hold for __________ seconds. Slowly release the tension and relax your muscles completely before starting the next repetition.  After you have practiced this exercise, remove the arm support and complete the exercise in standing as well as sitting position. Repeat __________ times. Complete this exercise __________ times per day.  STRENGTH - Shoulder Abductors, Isometric  With good posture, stand or sit about 4-6 inches from a wall, with your right / left side facing the wall.  Bend your right / left elbow. Gently press your right / left elbow into the wall. Increase the pressure gradually, until you are pressing as hard as you can, without shrugging your shoulder or increasing any shoulder discomfort.  Hold for __________ seconds.  Release the tension slowly. Relax your shoulder muscles completely before you begin the next repetition. Repeat __________ times. Complete this exercise __________ times per day.  STRENGTH - External Rotators, Isometric  Keep your right / left elbow at your side and bend it 90 degrees.  Step into a door frame so that the outside of your right / left wrist can press against the door frame without your upper arm leaving your side.  Gently press your right / left wrist into the door frame, as if you were trying to swing the back of your hand away from your stomach. Gradually increase the tension, until you are pressing as hard as you can, without shrugging your shoulder or increasing any shoulder discomfort.  Hold for __________ seconds.  Release the tension slowly. Relax your shoulder muscles completely before you begin the next repetition. Repeat __________ times. Complete this exercise __________ times per day.  STRENGTH - Supraspinatus   Stand or sit with good posture. Grasp a __________ weight, or an exercise band or tubing, so that your hand is  "thumbs-up," like you are shaking hands.  Slowly lift your right / left arm in a "V" away from your thigh, diagonally into the space between your side and straight ahead. Lift your hand to shoulder height or as far as you can, without increasing any shoulder pain. At first, many people do not lift  their hands above shoulder height.  Avoid shrugging your right / left shoulder as your arm rises, by keeping your shoulder blade tucked down and toward your mid-back spine.  Hold for __________ seconds. Control the descent of your hand, as you slowly return to your starting position. Repeat __________ times. Complete this exercise __________ times per day.  STRENGTH - External Rotators  Secure a rubber exercise band or tubing to a fixed object (table, pole) so that it is at the same height as your right / left elbow when you are standing or sitting on a firm surface.  Stand or sit so that the secured exercise band is at your uninjured side.  Bend your right / left elbow 90 degrees. Place a folded towel or small pillow under your right / left arm, so that your elbow is a few inches away from your side.  Keeping the tension on the exercise band, pull it away from your body, as if pivoting on your elbow. Be sure to keep your body steady, so that the movement is coming only from your rotating shoulder.  Hold for __________ seconds. Release the tension in a controlled manner, as you return to the starting position. Repeat __________ times. Complete this exercise __________ times per day.  STRENGTH - Internal Rotators   Secure a rubber exercise band or tubing to a fixed object (table, pole) so that it is at the same height as your right / left elbow when you are standing or sitting on a firm surface.  Stand or sit so that the secured exercise band is at your right / left side.  Bend your elbow 90 degrees. Place a folded towel or small pillow under your right / left arm so that your elbow is a few inches  away from your side.  Keeping the tension on the exercise band, pull it across your body, toward your stomach. Be sure to keep your body steady, so that the movement is coming only from your rotating shoulder.  Hold for __________ seconds. Release the tension in a controlled manner, as you return to the starting position. Repeat __________ times. Complete this exercise __________ times per day.  STRENGTH - Scapular Protractors, Standing   Stand arms length away from a wall. Place your hands on the wall, keeping your elbows straight.  Begin by dropping your shoulder blades down and toward your mid-back spine.  To strengthen your protractors, keep your shoulder blades down, but slide them forward on your rib cage. It will feel as if you are lifting the back of your rib cage away from the wall. This is a subtle motion and can be challenging to complete. Ask your caregiver for further instruction, if you are not sure you are doing the exercise correctly.  Hold for __________ seconds. Slowly return to the starting position, resting the muscles completely before starting the next repetition. Repeat __________ times. Complete this exercise __________ times per day. STRENGTH - Scapular Protractors, Supine  Lie on your back on a firm surface. Extend your right / left arm straight into the air while holding a __________ weight in your hand.  Keeping your head and back in place, lift your shoulder off the floor.  Hold for __________ seconds. Slowly return to the starting position, and allow your muscles to relax completely before starting the next repetition. Repeat __________ times. Complete this exercise __________ times per day. STRENGTH - Scapular Protractors, Quadruped  Get onto your hands and knees, with your shoulders directly  over your hands (or as close as you can be, comfortably).  Keeping your elbows locked, lift the back of your rib cage up into your shoulder blades, so your mid-back  rounds out. Keep your neck muscles relaxed.  Hold this position for __________ seconds. Slowly return to the starting position and allow your muscles to relax completely before starting the next repetition. Repeat __________ times. Complete this exercise __________ times per day.  STRENGTH - Scapular Retractors  Secure a rubber exercise band or tubing to a fixed object (table, pole), so that it is at the height of your shoulders when you are either standing, or sitting on a firm armless chair.  With a palm down grip, grasp an end of the band in each hand. Straighten your elbows and lift your hands straight in front of you, at shoulder height. Step back, away from the secured end of the band, until it becomes tense.  Squeezing your shoulder blades together, draw your elbows back toward your sides, as you bend them. Keep your upper arms lifted away from your body throughout the exercise.  Hold for __________ seconds. Slowly ease the tension on the band, as you reverse the directions and return to the starting position. Repeat __________ times. Complete this exercise __________ times per day. STRENGTH - Shoulder Extensors   Secure a rubber exercise band or tubing to a fixed object (table, pole) so that it is at the height of your shoulders when you are either standing, or sitting on a firm armless chair.  With a thumbs-up grip, grasp an end of the band in each hand. Straighten your elbows and lift your hands straight in front of you, at shoulder height. Step back, away from the secured end of the band, until it becomes tense.  Squeezing your shoulder blades together, pull your hands down to the sides of your thighs. Do not allow your hands to go behind you.  Hold for __________ seconds. Slowly ease the tension on the band, as you reverse the directions and return to the starting position. Repeat __________ times. Complete this exercise __________ times per day.  STRENGTH - Scapular Retractors  and External Rotators   Secure a rubber exercise band or tubing to a fixed object (table, pole) so that it is at the height as your shoulders, when you are either standing, or sitting on a firm armless chair.  With a palm down grip, grasp an end of the band in each hand. Bend your elbows 90 degrees and lift your elbows to shoulder height, at your sides. Step back, away from the secured end of the band, until it becomes tense.  Squeezing your shoulder blades together, rotate your shoulders so that your upper arms and elbows remain stationary, but your fists travel upward to head height.  Hold for __________ seconds. Slowly ease the tension on the band, as you reverse the directions and return to the starting position. Repeat __________ times. Complete this exercise __________ times per day.  STRENGTH - Scapular Retractors and External Rotators, Rowing   Secure a rubber exercise band or tubing to a fixed object (table, pole) so that it is at the height of your shoulders, when you are either standing, or sitting on a firm armless chair.  With a palm down grip, grasp an end of the band in each hand. Straighten your elbows and lift your hands straight in front of you, at shoulder height. Step back, away from the secured end of the band, until it  becomes tense.  Step 1: Squeeze your shoulder blades together. Bending your elbows, draw your hands to your chest, as if you are rowing a boat. At the end of this motion, your hands and elbow should be at shoulder height and your elbows should be out to your sides.  Step 2: Rotate your shoulders, to raise your hands above your head. Your forearms should be vertical and your upper arms should be horizontal.  Hold for __________ seconds. Slowly ease the tension on the band, as you reverse the directions and return to the starting position. Repeat __________ times. Complete this exercise __________ times per day.  STRENGTH - Scapular Depressors  Find a sturdy  chair without wheels, such as a dining room chair.  Keeping your feet on the floor, and your hands on the chair arms, lift your bottom up from the seat, and lock your elbows.  Keeping your elbows straight, allow gravity to pull your body weight down. Your shoulders will rise toward your ears.  Raise your body against gravity by drawing your shoulder blades down your back, shortening the distance between your shoulders and ears. Although your feet should always maintain contact with the floor, your feet should progressively support less body weight, as you get stronger.  Hold for __________ seconds. In a controlled and slow manner, lower your body weight to begin the next repetition. Repeat __________ times. Complete this exercise __________ times per day.    This information is not intended to replace advice given to you by your health care provider. Make sure you discuss any questions you have with your health care provider.   Document Released: 05/21/2005 Document Revised: 06/11/2014 Document Reviewed: 09/02/2008 [employer] Interactive Patient Education [employer].

## 2015-09-14 NOTE — Progress Notes (Signed)
Subjective:  By signing my name below, I, Zoe Kim, attest that this documentation has been prepared under the direction and in the presence of Zoe Ray, MD. Electronically Signed: [name], [place]. 09/14/2015 , 11:25 AM .  Patient was seen in Room 22 .   Patient ID: Zoe Kim, female    DOB: 1946/01/11, 70 y.o.   MRN: [id number] Chief Complaint  Patient presents with  . Cough    on/off since Jan. 2017  . tops of feet tingle    on/off x 1 year  . depression and anxiety    "not doing great", would like to go back to Prozac. Depression scale, score 12   HPI Zoe Kim is a 70 y.o. female  Here for 3 concerns.   Left Shoulder pain She joined a gym and went in on her own one time. She was using a treadmill and the speed was too high, caused her to slide off the back of the treadmill. She landed on her left shoulder and arm.   Afterwards, she walked around the gym and was stretching her hip and back. She felt something pop, and went home afterwards. She denies seeking medical help. She's been using crutches at home, and this has resolved.   She has a history of head fracture: when she was a child, "she tripped over a dog and landed on her head".   Depression with Axniety See last visit. Also had to use klonopin as needed. Last visit, wanted to try different medication, started zoloft 50mg  qd. Recommended follow up in 6 weeks; recommended restarting exercise and horseback riding to manage her stress.   With the zoloft, she feels "more depressed".   Depression screen [facility] 2/9 09/14/2015 07/27/2015 10/18/2014 12/14/2013  Decreased Interest 2 2 1 1   Down, Depressed, Hopeless 1 2 1 2   PHQ - 2 Score 3 4 2 3   Altered sleeping 3 1 2 2   Tired, decreased energy 3 3 3 3   Change in appetite 3 3 3 3   Feeling bad or failure about yourself  - 2 1 3   Trouble concentrating 0 1 0 0  Moving slowly or fidgety/restless 0 2 0 0  Suicidal thoughts 0 0 0 1  PHQ-9  Score 12 16 11 15   Difficult doing work/chores Somewhat difficult Somewhat difficult Somewhat difficult -   This is lower than last visit. She denies SI.   Cough This had been ongoing for approximately 1 month in Feb, no treatments. She was started on zpak, recommended allegra or zyrtec.   Patient states her cough is not persistent, as it would keep coming back. She was feeling better, but cough worsened again 5 days ago with low grade fever (tmax 99) at that time. She took mucinex for relief. She's started back on allegra again. She notes allergy medications have helped this greatly.   Tingling on top of her feet She noticed intermittent tingling in her feet for a year. It started on the top of her feet, and now into her soles. It primarily stays in her feet, but very seldomly, the tingling sensation will go into her leg. She also mentions right arm tingling when using her mouse. She denies any injuries to the area. She denies back pain.   She has some aches and pains in her joints, but believes due to weight and age.   There are no active problems to display for this patient.  Past Medical History  Diagnosis Date  .  Arthritis    Past Surgical History  Procedure Laterality Date  . Knee surgery    . Vein ligation and stripping    . Tonsillectomy     Allergies  Allergen Reactions  . Sulfa Antibiotics    Prior to Admission medications   Medication Sig Start Date End Date Taking? Authorizing Provider  azithromycin (ZITHROMAX) 250 MG tablet Take 2 pills by mouth on day 1, then 1 pill by mouth per day on days 2 through 5. 07/27/15   [name], MD  ibuprofen (ADVIL,MOTRIN) 200 MG tablet Take 200 mg by mouth every 6 (six) hours as needed.    Historical Provider, MD  nabumetone (RELAFEN) 750 MG tablet Take 750 mg by mouth 2 (two) times daily. Reported on 07/27/2015    Historical Provider, MD  oxybutynin (DITROPAN) 5 MG tablet TAKE 1/2 TABLET BY MOUTH 3 TIMES A DAY as needed. 07/27/15    [name], MD  sertraline (ZOLOFT) 50 MG tablet Take 1 tablet (50 mg total) by mouth daily. 07/27/15   [name], MD   Social History   Social History  . Marital Status: Married    Spouse Name: N/A  . Number of Children: N/A  . Years of Education: N/A   Occupational History  . Not on file.   Social History Main Topics  . Smoking status: Never Smoker   . Smokeless tobacco: Not on file  . Alcohol Use: No  . Drug Use: No  . Sexual Activity: Yes   Other Topics Concern  . Not on file   Social History Narrative   Review of Systems  Constitutional: Negative for fever, chills and fatigue.  Musculoskeletal: Positive for myalgias and arthralgias. Negative for back pain, gait problem, neck pain and neck stiffness.  Neurological: Positive for numbness. Negative for dizziness, weakness and headaches.  Psychiatric/Behavioral: Negative for self-injury. The patient is nervous/anxious.       Objective:   Physical Exam  Constitutional: She is oriented to person, place, and time. She appears well-developed and well-nourished. No distress.  HENT:  Head: Normocephalic and atraumatic.  Eyes: EOM are normal. Pupils are equal, round, and reactive to light.  Neck: Neck supple.  Cardiovascular: Normal rate.   Pulmonary/Chest: Effort normal and breath sounds normal. No respiratory distress.  Musculoskeletal: Normal range of motion.  Left shoulder: full rom, [place]/ac joints non tender, slight tenderness lateral deltoid area, bicipital groove non tender, full rotator cuff strength  Neurological: She is alert and oriented to person, place, and time. She displays no Babinski's sign on the right side. She displays no Babinski's sign on the left side.  Reflex Scores:      Tricep reflexes are 2+ on the right side and 2+ on the left side.      Bicep reflexes are 2+ on the right side and 2+ on the left side.      Brachioradialis reflexes are 2+ on the right side and 2+ on the left side.       Patellar reflexes are 2+ on the right side and 2+ on the left side.      Achilles reflexes are 1+ on the right side and 1+ on the left side. Normal microfilament testing  Skin: Skin is warm and dry.  Psychiatric: She has a normal mood and affect. Her behavior is normal.  Nursing note and vitals reviewed.   Filed Vitals:   09/14/15 1040  BP: 120/72  Pulse: 93  Temp: 98.3 F (36.8 C)  TempSrc: Oral  Resp: 16  Height: 5\' 5"  (1.651 m)  Weight: 191 lb 9.6 oz (86.909 kg)  SpO2: 97%   Results for orders placed or performed in visit on 09/14/15  POCT glucose (manual entry)  Result Value Ref Range   POC Glucose 115 (A) 70 - 99 mg/dl  POCT glycosylated hemoglobin (Hb A1C)  Result Value Ref Range   Hemoglobin A1C 5.5       Dg Chest 2 View  09/14/2015  CLINICAL DATA:  Intermittent coughing for the past month EXAM: CHEST  2 VIEW COMPARISON:  PA and lateral chest x-Kim of May 25, 2012 FINDINGS: The lungs are adequately inflated and clear. The heart and pulmonary vascularity are normal. The mediastinum is normal in width. There is no pleural effusion. There is moderate multilevel degenerative disc disease of the thoracic spine. IMPRESSION: There is no active cardiopulmonary disease. Electronically Signed   By: David  Martinique M.D.   On: 09/14/2015 12:34   Dg Shoulder Left  09/14/2015  CLINICAL DATA:  Status post fall from the back of the treadmill landing on the left shoulder and arm EXAM: LEFT SHOULDER - 2+ VIEW COMPARISON:  Chest x-Kim of today's date and May 25, 2012 which included portions of the left shoulder. FINDINGS: The bones of the shoulder are adequately mineralized. The glenohumeral and AC joint spaces are preserved. The subacromial subdeltoid space is normal. The observed portions of the left clavicle and upper left ribs are normal. IMPRESSION: There is no acute bony abnormality of the left shoulder. Electronically Signed   By: David  Martinique M.D.   On: 09/14/2015 12:35     Assessment & Plan:   MUNTAZ PALLAN is a 70 y.o. female Depression - Plan: FLUoxetine (PROZAC) 20 MG tablet  -Did not tolerate change to other SSRI, will restart Prozac after given choice of different SSRIs. Exercise for stress relief discussed. Recheck 2 months.   Cough - Plan: CBC, fexofenadine (ALLEGRA) 180 MG tablet, DG Chest 2 View  -No concerning findings on chest x-Kim. Trial of Allegra daily, add Flonase if not improved. Recheck in the next 6 weeks if persistent, sooner if worse.  Other polyneuropathy ([place]) - Plan: CBC, Vitamin B12, POCT glucose (manual entry), POCT glycosylated hemoglobin (Hb A1C), TSH  -Dorsal feet, hands at times with numbness/tingling. Will start with CBC, B12, TSH, then refer to neuro if these are normal.  Allergic rhinitis, unspecified allergic rhinitis type - Plan: fexofenadine (ALLEGRA) 180 MG tablet  As above, Allegra, Flonase nasal spray as needed.  Left shoulder pain - Plan: DG Shoulder Left  -Impingement/subacromial bursitis versus rotator cuff tendinosis. Home exercise program discussed, range of motion, symptomatic care, and can recheck in next 6 weeks if not improving. Sooner if worse or decreased range of motion.  Meds ordered this encounter  Medications  . fexofenadine (ALLEGRA) 180 MG tablet    Sig: Take 1 tablet (180 mg total) by mouth daily. Taking otc    Dispense:  30 tablet    Refill:  0  . FLUoxetine (PROZAC) 20 MG tablet    Sig: Take 1 tablet (20 mg total) by mouth daily.    Dispense:  30 tablet    Refill:  3   Patient Instructions       IF you received an x-Kim today, you will receive an invoice from [facility]. Please contact [facility] at [phone] with questions or concerns regarding your invoice.   IF you received labwork today, you will receive an  invoice from [employer]. Please contact [facility] at [phone] with questions or concerns regarding your invoice.    Our billing staff will not be able to assist you with questions regarding bills from these companies.  You will be contacted with the lab results as soon as they are available. The fastest way to get your results is to activate your My Chart account. Instructions are located on the last page of this paperwork. If you have not heard from [place] regarding the results in 2 weeks, please contact this office.    I will start with some Kim tests for the tingling in feet and hands, but if these are normal, would recommend evaluation with neurologist. I can refer you if needed.   Continue range of motion for your shoulder (see exercises below), avoid overuse, tylenol if needed and if not improving in next 6 weeks - return for recheck  Allegra or claritin once per day for cough/allergies. Flonase nasal spray can also help. If cough not resolving in next 6 weeks - can look at other causes. Return to the clinic or go to the nearest emergency room if any of your symptoms worsen or new symptoms occur.  Restart prozac, start back up with exercise and recheck in the next 2 months to see how this is going.   Cough, Adult Coughing is a reflex that clears your throat and your airways. Coughing helps to heal and protect your lungs. It is normal to cough occasionally, but a cough that happens with other symptoms or lasts a long time may be a sign of a condition that needs treatment. A cough may last only 2-3 weeks (acute), or it may last longer than 8 weeks (chronic). CAUSES Coughing is commonly caused by:  Breathing in substances that irritate your lungs.  A viral or bacterial respiratory infection.  Allergies.  Asthma.  Postnasal drip.  Smoking.  Acid backing up from the stomach into the esophagus (gastroesophageal reflux).  Certain medicines.  Chronic lung problems, including COPD (or rarely, lung cancer).  Other medical conditions such as heart failure. HOME CARE INSTRUCTIONS  Pay attention  to any changes in your symptoms. Take these actions to help with your discomfort:  Take medicines only as told by your health care provider.  If you were prescribed an antibiotic medicine, take it as told by your health care provider. Do not stop taking the antibiotic even if you start to feel better.  Talk with your health care provider before you take a cough suppressant medicine.  Drink enough fluid to keep your urine clear or pale yellow.  If the air is dry, use a cold steam vaporizer or humidifier in your bedroom or your home to help loosen secretions.  Avoid anything that causes you to cough at work or at home.  If your cough is worse at night, try sleeping in a semi-upright position.  Avoid cigarette smoke. If you smoke, quit smoking. If you need help quitting, ask your health care provider.  Avoid caffeine.  Avoid alcohol.  Rest as needed. SEEK MEDICAL CARE IF:   You have new symptoms.  You cough up pus.  Your cough does not get better after 2-3 weeks, or your cough gets worse.  You cannot control your cough with suppressant medicines and you are losing sleep.  You develop pain that is getting worse or pain that is not controlled with pain medicines.  You have a fever.  You have unexplained weight loss.  You  have night sweats. SEEK IMMEDIATE MEDICAL CARE IF:  You cough up Kim.  You have difficulty breathing.  Your heartbeat is very fast.   This information is not intended to replace advice given to you by your health care provider. Make sure you discuss any questions you have with your health care provider.   Document Released: 11/17/2010 Document Revised: 02/09/2015 Document Reviewed: 07/28/2014 Elsevier Interactive Patient Education 2016 [employer]    Impingement Syndrome, Rotator Cuff, Bursitis With Rehab Impingement syndrome is a condition that involves inflammation of the tendons of the rotator cuff and the subacromial bursa, that causes pain  in the shoulder. The rotator cuff consists of four tendons and muscles that control much of the shoulder and upper arm function. The subacromial bursa is a fluid filled sac that helps reduce friction between the rotator cuff and one of the bones of the shoulder (acromion). Impingement syndrome is usually an overuse injury that causes swelling of the bursa (bursitis), swelling of the tendon (tendonitis), and/or a tear of the tendon (strain). Strains are classified into three categories. Grade 1 strains cause pain, but the tendon is not lengthened. Grade 2 strains include a lengthened ligament, due to the ligament being stretched or partially ruptured. With grade 2 strains there is still function, although the function may be decreased. Grade 3 strains include a complete tear of the tendon or muscle, and function is usually impaired. SYMPTOMS   Pain around the shoulder, often at the outer portion of the upper arm.  Pain that gets worse with shoulder function, especially when reaching overhead or lifting.  Sometimes, aching when not using the arm.  Pain that wakes you up at night.  Sometimes, tenderness, swelling, warmth, or redness over the affected area.  Loss of strength.  Limited motion of the shoulder, especially reaching behind the back (to the back pocket or to unhook bra) or across your body.  Crackling sound (crepitation) when moving the arm.  Biceps tendon pain and inflammation (in the front of the shoulder). Worse when bending the elbow or lifting. CAUSES  Impingement syndrome is often an overuse injury, in which chronic (repetitive) motions cause the tendons or bursa to become inflamed. A strain occurs when a force is paced on the tendon or muscle that is greater than it can withstand. Common mechanisms of injury include: Stress from sudden increase in duration, frequency, or intensity of training.  Direct hit (trauma) to the shoulder.  Aging, erosion of the tendon with normal  use.  Bony bump on shoulder (acromial spur). RISK INCREASES WITH:  Contact sports (football, wrestling, boxing).  Throwing sports (baseball, tennis, volleyball).  Weightlifting and bodybuilding.  Heavy labor.  Previous injury to the rotator cuff, including impingement.  Poor shoulder strength and flexibility.  Failure to warm up properly before activity.  Inadequate protective equipment.  Old age.  Bony bump on shoulder (acromial spur). PREVENTION   Warm up and stretch properly before activity.  Allow for adequate recovery between workouts.  Maintain physical fitness:  Strength, flexibility, and endurance.  Cardiovascular fitness.  Learn and use proper exercise technique. PROGNOSIS  If treated properly, impingement syndrome usually goes away within 6 weeks. Sometimes surgery is required.  RELATED COMPLICATIONS   Longer healing time if not properly treated, or if not given enough time to heal.  Recurring symptoms, that result in a chronic condition.  Shoulder stiffness, frozen shoulder, or loss of motion.  Rotator cuff tendon tear.  Recurring symptoms, especially if activity is resumed too  soon, with overuse, with a direct blow, or when using poor technique. TREATMENT  Treatment first involves the use of ice and medicine, to reduce pain and inflammation. The use of strengthening and stretching exercises may help reduce pain with activity. These exercises may be performed at home or with a therapist. If non-surgical treatment is unsuccessful after more than 6 months, surgery may be advised. After surgery and rehabilitation, activity is usually possible in 3 months.  MEDICATION  If pain medicine is needed, nonsteroidal anti-inflammatory medicines (aspirin and ibuprofen), or other minor pain relievers (acetaminophen), are often advised.  Do not take pain medicine for 7 days before surgery.  Prescription pain relievers may be given, if your caregiver thinks they  are needed. Use only as directed and only as much as you need.  Corticosteroid injections may be given by your caregiver. These injections should be reserved for the most serious cases, because they may only be given a certain number of times. HEAT AND COLD  Cold treatment (icing) should be applied for 10 to 15 minutes every 2 to 3 hours for inflammation and pain, and immediately after activity that aggravates your symptoms. Use ice packs or an ice massage.  Heat treatment may be used before performing stretching and strengthening activities prescribed by your caregiver, physical therapist, or athletic trainer. Use a heat pack or a warm water soak. SEEK MEDICAL CARE IF:   Symptoms get worse or do not improve in 4 to 6 weeks, despite treatment.  New, unexplained symptoms develop. (Drugs used in treatment may produce side effects.) EXERCISES  RANGE OF MOTION (ROM) AND STRETCHING EXERCISES - Impingement Syndrome (Rotator Cuff  Tendinitis, Bursitis) These exercises may help you when beginning to rehabilitate your injury. Your symptoms may go away with or without further involvement from your physician, physical therapist or athletic trainer. While completing these exercises, remember:   Restoring tissue flexibility helps normal motion to return to the joints. This allows healthier, less painful movement and activity.  An effective stretch should be held for at least 30 seconds.  A stretch should never be painful. You should only feel a gentle lengthening or release in the stretched tissue. STRETCH - Flexion, Standing  Stand with good posture. With an underhand grip on your right / left hand, and an overhand grip on the opposite hand, grasp a broomstick or cane so that your hands are a little more than shoulder width apart.  Keeping your right / left elbow straight and shoulder muscles relaxed, push the stick with your opposite hand, to raise your right / left arm in front of your body and then  overhead. Raise your arm until you feel a stretch in your right / left shoulder, but before you have increased shoulder pain.  Try to avoid shrugging your right / left shoulder as your arm rises, by keeping your shoulder blade tucked down and toward your mid-back spine. Hold for __________ seconds.  Slowly return to the starting position. Repeat __________ times. Complete this exercise __________ times per day. STRETCH - Abduction, Supine  Lie on your back. With an underhand grip on your right / left hand and an overhand grip on the opposite hand, grasp a broomstick or cane so that your hands are a little more than shoulder width apart.  Keeping your right / left elbow straight and your shoulder muscles relaxed, push the stick with your opposite hand, to raise your right / left arm out to the side of your body and then  overhead. Raise your arm until you feel a stretch in your right / left shoulder, but before you have increased shoulder pain.  Try to avoid shrugging your right / left shoulder as your arm rises, by keeping your shoulder blade tucked down and toward your mid-back spine. Hold for __________ seconds.  Slowly return to the starting position. Repeat __________ times. Complete this exercise __________ times per day. ROM - Flexion, Active-Assisted  Lie on your back. You may bend your knees for comfort.  Grasp a broomstick or cane so your hands are about shoulder width apart. Your right / left hand should grip the end of the stick, so that your hand is positioned "thumbs-up," as if you were about to shake hands.  Using your healthy arm to lead, raise your right / left arm overhead, until you feel a gentle stretch in your shoulder. Hold for __________ seconds.  Use the stick to assist in returning your right / left arm to its starting position. Repeat __________ times. Complete this exercise __________ times per day.  ROM - Internal Rotation, Supine   Lie on your back on a firm  surface. Place your right / left elbow about 60 degrees away from your side. Elevate your elbow with a folded towel, so that the elbow and shoulder are the same height.  Using a broomstick or cane and your strong arm, pull your right / left hand toward your body until you feel a gentle stretch, but no increase in your shoulder pain. Keep your shoulder and elbow in place throughout the exercise.  Hold for __________ seconds. Slowly return to the starting position. Repeat __________ times. Complete this exercise __________ times per day. STRETCH - Internal Rotation  Place your right / left hand behind your back, palm up.  Throw a towel or belt over your opposite shoulder. Grasp the towel with your right / left hand.  While keeping an upright posture, gently pull up on the towel, until you feel a stretch in the front of your right / left shoulder.  Avoid shrugging your right / left shoulder as your arm rises, by keeping your shoulder blade tucked down and toward your mid-back spine.  Hold for __________ seconds. Release the stretch, by lowering your healthy hand. Repeat __________ times. Complete this exercise __________ times per day. ROM - Internal Rotation   Using an underhand grip, grasp a stick behind your back with both hands.  While standing upright with good posture, slide the stick up your back until you feel a mild stretch in the front of your shoulder.  Hold for __________ seconds. Slowly return to your starting position. Repeat __________ times. Complete this exercise __________ times per day.  STRETCH - Posterior Shoulder Capsule   Stand or sit with good posture. Grasp your right / left elbow and draw it across your chest, keeping it at the same height as your shoulder.  Pull your elbow, so your upper arm comes in closer to your chest. Pull until you feel a gentle stretch in the back of your shoulder.  Hold for __________ seconds. Repeat __________ times. Complete this  exercise __________ times per day. STRENGTHENING EXERCISES - Impingement Syndrome (Rotator Cuff Tendinitis, Bursitis) These exercises may help you when beginning to rehabilitate your injury. They may resolve your symptoms with or without further involvement from your physician, physical therapist or athletic trainer. While completing these exercises, remember:  Muscles can gain both the endurance and the strength needed for everyday activities through controlled  exercises.  Complete these exercises as instructed by your physician, physical therapist or athletic trainer. Increase the resistance and repetitions only as guided.  You may experience muscle soreness or fatigue, but the pain or discomfort you are trying to eliminate should never worsen during these exercises. If this pain does get worse, stop and make sure you are following the directions exactly. If the pain is still present after adjustments, discontinue the exercise until you can discuss the trouble with your clinician.  During your recovery, avoid activity or exercises which involve actions that place your injured hand or elbow above your head or behind your back or head. These positions stress the tissues which you are trying to heal. STRENGTH - Scapular Depression and Adduction   With good posture, sit on a firm chair. Support your arms in front of you, with pillows, arm rests, or on a table top. Have your elbows in line with the sides of your body.  Gently draw your shoulder blades down and toward your mid-back spine. Gradually increase the tension, without tensing the muscles along the top of your shoulders and the back of your neck.  Hold for __________ seconds. Slowly release the tension and relax your muscles completely before starting the next repetition.  After you have practiced this exercise, remove the arm support and complete the exercise in standing as well as sitting position. Repeat __________ times. Complete this  exercise __________ times per day.  STRENGTH - Shoulder Abductors, Isometric  With good posture, stand or sit about 4-6 inches from a wall, with your right / left side facing the wall.  Bend your right / left elbow. Gently press your right / left elbow into the wall. Increase the pressure gradually, until you are pressing as hard as you can, without shrugging your shoulder or increasing any shoulder discomfort.  Hold for __________ seconds.  Release the tension slowly. Relax your shoulder muscles completely before you begin the next repetition. Repeat __________ times. Complete this exercise __________ times per day.  STRENGTH - External Rotators, Isometric  Keep your right / left elbow at your side and bend it 90 degrees.  Step into a door frame so that the outside of your right / left wrist can press against the door frame without your upper arm leaving your side.  Gently press your right / left wrist into the door frame, as if you were trying to swing the back of your hand away from your stomach. Gradually increase the tension, until you are pressing as hard as you can, without shrugging your shoulder or increasing any shoulder discomfort.  Hold for __________ seconds.  Release the tension slowly. Relax your shoulder muscles completely before you begin the next repetition. Repeat __________ times. Complete this exercise __________ times per day.  STRENGTH - Supraspinatus   Stand or sit with good posture. Grasp a __________ weight, or an exercise band or tubing, so that your hand is "thumbs-up," like you are shaking hands.  Slowly lift your right / left arm in a "V" away from your thigh, diagonally into the space between your side and straight ahead. Lift your hand to shoulder height or as far as you can, without increasing any shoulder pain. At first, many people do not lift their hands above shoulder height.  Avoid shrugging your right / left shoulder as your arm rises, by keeping  your shoulder blade tucked down and toward your mid-back spine.  Hold for __________ seconds. Control the descent of your hand,  as you slowly return to your starting position. Repeat __________ times. Complete this exercise __________ times per day.  STRENGTH - External Rotators  Secure a rubber exercise band or tubing to a fixed object (table, pole) so that it is at the same height as your right / left elbow when you are standing or sitting on a firm surface.  Stand or sit so that the secured exercise band is at your uninjured side.  Bend your right / left elbow 90 degrees. Place a folded towel or small pillow under your right / left arm, so that your elbow is a few inches away from your side.  Keeping the tension on the exercise band, pull it away from your body, as if pivoting on your elbow. Be sure to keep your body steady, so that the movement is coming only from your rotating shoulder.  Hold for __________ seconds. Release the tension in a controlled manner, as you return to the starting position. Repeat __________ times. Complete this exercise __________ times per day.  STRENGTH - Internal Rotators   Secure a rubber exercise band or tubing to a fixed object (table, pole) so that it is at the same height as your right / left elbow when you are standing or sitting on a firm surface.  Stand or sit so that the secured exercise band is at your right / left side.  Bend your elbow 90 degrees. Place a folded towel or small pillow under your right / left arm so that your elbow is a few inches away from your side.  Keeping the tension on the exercise band, pull it across your body, toward your stomach. Be sure to keep your body steady, so that the movement is coming only from your rotating shoulder.  Hold for __________ seconds. Release the tension in a controlled manner, as you return to the starting position. Repeat __________ times. Complete this exercise __________ times per day.    STRENGTH - Scapular Protractors, Standing   Stand arms length away from a wall. Place your hands on the wall, keeping your elbows straight.  Begin by dropping your shoulder blades down and toward your mid-back spine.  To strengthen your protractors, keep your shoulder blades down, but slide them forward on your rib cage. It will feel as if you are lifting the back of your rib cage away from the wall. This is a subtle motion and can be challenging to complete. Ask your caregiver for further instruction, if you are not sure you are doing the exercise correctly.  Hold for __________ seconds. Slowly return to the starting position, resting the muscles completely before starting the next repetition. Repeat __________ times. Complete this exercise __________ times per day. STRENGTH - Scapular Protractors, Supine  Lie on your back on a firm surface. Extend your right / left arm straight into the air while holding a __________ weight in your hand.  Keeping your head and back in place, lift your shoulder off the floor.  Hold for __________ seconds. Slowly return to the starting position, and allow your muscles to relax completely before starting the next repetition. Repeat __________ times. Complete this exercise __________ times per day. STRENGTH - Scapular Protractors, Quadruped  Get onto your hands and knees, with your shoulders directly over your hands (or as close as you can be, comfortably).  Keeping your elbows locked, lift the back of your rib cage up into your shoulder blades, so your mid-back rounds out. Keep your neck muscles relaxed.  Hold this position for __________ seconds. Slowly return to the starting position and allow your muscles to relax completely before starting the next repetition. Repeat __________ times. Complete this exercise __________ times per day.  STRENGTH - Scapular Retractors  Secure a rubber exercise band or tubing to a fixed object (table, pole), so that it is  at the height of your shoulders when you are either standing, or sitting on a firm armless chair.  With a palm down grip, grasp an end of the band in each hand. Straighten your elbows and lift your hands straight in front of you, at shoulder height. Step back, away from the secured end of the band, until it becomes tense.  Squeezing your shoulder blades together, draw your elbows back toward your sides, as you bend them. Keep your upper arms lifted away from your body throughout the exercise.  Hold for __________ seconds. Slowly ease the tension on the band, as you reverse the directions and return to the starting position. Repeat __________ times. Complete this exercise __________ times per day. STRENGTH - Shoulder Extensors   Secure a rubber exercise band or tubing to a fixed object (table, pole) so that it is at the height of your shoulders when you are either standing, or sitting on a firm armless chair.  With a thumbs-up grip, grasp an end of the band in each hand. Straighten your elbows and lift your hands straight in front of you, at shoulder height. Step back, away from the secured end of the band, until it becomes tense.  Squeezing your shoulder blades together, pull your hands down to the sides of your thighs. Do not allow your hands to go behind you.  Hold for __________ seconds. Slowly ease the tension on the band, as you reverse the directions and return to the starting position. Repeat __________ times. Complete this exercise __________ times per day.  STRENGTH - Scapular Retractors and External Rotators   Secure a rubber exercise band or tubing to a fixed object (table, pole) so that it is at the height as your shoulders, when you are either standing, or sitting on a firm armless chair.  With a palm down grip, grasp an end of the band in each hand. Bend your elbows 90 degrees and lift your elbows to shoulder height, at your sides. Step back, away from the secured end of the band,  until it becomes tense.  Squeezing your shoulder blades together, rotate your shoulders so that your upper arms and elbows remain stationary, but your fists travel upward to head height.  Hold for __________ seconds. Slowly ease the tension on the band, as you reverse the directions and return to the starting position. Repeat __________ times. Complete this exercise __________ times per day.  STRENGTH - Scapular Retractors and External Rotators, Rowing   Secure a rubber exercise band or tubing to a fixed object (table, pole) so that it is at the height of your shoulders, when you are either standing, or sitting on a firm armless chair.  With a palm down grip, grasp an end of the band in each hand. Straighten your elbows and lift your hands straight in front of you, at shoulder height. Step back, away from the secured end of the band, until it becomes tense.  Step 1: Squeeze your shoulder blades together. Bending your elbows, draw your hands to your chest, as if you are rowing a boat. At the end of this motion, your hands and elbow should be at  shoulder height and your elbows should be out to your sides.  Step 2: Rotate your shoulders, to raise your hands above your head. Your forearms should be vertical and your upper arms should be horizontal.  Hold for __________ seconds. Slowly ease the tension on the band, as you reverse the directions and return to the starting position. Repeat __________ times. Complete this exercise __________ times per day.  STRENGTH - Scapular Depressors  Find a sturdy chair without wheels, such as a dining room chair.  Keeping your feet on the floor, and your hands on the chair arms, lift your bottom up from the seat, and lock your elbows.  Keeping your elbows straight, allow gravity to pull your body weight down. Your shoulders will rise toward your ears.  Raise your body against gravity by drawing your shoulder blades down your back, shortening the distance  between your shoulders and ears. Although your feet should always maintain contact with the floor, your feet should progressively support less body weight, as you get stronger.  Hold for __________ seconds. In a controlled and slow manner, lower your body weight to begin the next repetition. Repeat __________ times. Complete this exercise __________ times per day.    This information is not intended to replace advice given to you by your health care provider. Make sure you discuss any questions you have with your health care provider.   Document Released: 05/21/2005 Document Revised: 06/11/2014 Document Reviewed: 09/02/2008 Elsevier Interactive Patient Education [employer].     I personally performed the services described in this documentation, which was scribed in my presence. The recorded information has been reviewed and considered, and addended by me as needed.

## 2015-09-15 LAB — TSH: TSH: 1.57 m[IU]/L

## 2015-09-15 LAB — VITAMIN B12: VITAMIN B 12: 565 pg/mL (ref 200–1100)

## 2015-09-26 ENCOUNTER — Encounter: Payer: Self-pay | Admitting: *Deleted

## 2015-11-16 ENCOUNTER — Telehealth: Payer: Self-pay

## 2015-11-16 DIAGNOSIS — N6012 Diffuse cystic mastopathy of left breast: Secondary | ICD-10-CM

## 2015-11-16 NOTE — Telephone Encounter (Signed)
Contacted pt and informed her that order was placed.

## 2015-11-16 NOTE — Telephone Encounter (Signed)
Pt was getting a mammograph however she wasn't able to make the appointment. So she is going again to [place] and was told she has dense breast tissue and would need for Dr. Carlota Raspberry to send another order for her next appointment.  Please advise  [phone]

## 2015-11-16 NOTE — Telephone Encounter (Signed)
Done

## 2015-11-16 NOTE — Telephone Encounter (Signed)
Dr. Carlota Raspberry. I spoke with [facility], they need a new order put in because the pt missed her 75month follow up on left breast. They need unilateral left mammogram and ultrasound order put in in order to see pt.

## 2015-11-29 LAB — HM MAMMOGRAPHY: HM Mammogram: ABNORMAL — AB (ref 0–4)

## 2015-12-08 ENCOUNTER — Encounter: Payer: Self-pay | Admitting: Family Medicine

## 2015-12-08 ENCOUNTER — Ambulatory Visit (INDEPENDENT_AMBULATORY_CARE_PROVIDER_SITE_OTHER): Payer: Commercial Managed Care - HMO | Admitting: Family Medicine

## 2015-12-08 VITALS — BP 124/70 | HR 82 | Temp 97.7°F | Resp 18 | Ht 65.0 in | Wt 185.2 lb

## 2015-12-08 DIAGNOSIS — M25561 Pain in right knee: Secondary | ICD-10-CM

## 2015-12-08 MED ORDER — MELOXICAM 7.5 MG PO TABS: 7.5000 mg | ORAL_TABLET | Freq: Every day | ORAL | Status: DC

## 2015-12-08 NOTE — Patient Instructions (Addendum)
     IF you received an x-ray today, you will receive an invoice from [facility]. Please contact [facility] at [phone] with questions or concerns regarding your invoice.   IF you received labwork today, you will receive an invoice from [employer]. Please contact [facility] at [phone] with questions or concerns regarding your invoice.   Our billing staff will not be able to assist you with questions regarding bills from these companies.  You will be contacted with the lab results as soon as they are available. The fastest way to get your results is to activate your My Chart account. Instructions are located on the last page of this paperwork. If you have not heard from [place] regarding the results in 2 weeks, please contact this office.     Try the knee brace until you are seen by Dr. Noemi Chapel, and meloxicam once per day as needed. Do not combine this with other NSAIDs like ibuprofen. If any worsening of your symptoms, return here or Dr. Archie Endo office sooner.  Let me know if you have questions in the meantime.
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
## 2015-12-15 ENCOUNTER — Telehealth: Payer: Self-pay | Admitting: *Deleted

## 2015-12-15 NOTE — Telephone Encounter (Signed)
Called patient to let her know we have the knee brace, but patient stated on Tuesday ( she saw Dr Noemi Chapel and got and injection in the knee. She feels better now and does not need brace.

## 2016-01-05 ENCOUNTER — Ambulatory Visit: Payer: Commercial Managed Care - HMO | Admitting: Family Medicine

## 2016-01-18 ENCOUNTER — Encounter: Payer: Self-pay | Admitting: Family Medicine

## 2016-01-19 ENCOUNTER — Other Ambulatory Visit: Payer: Self-pay | Admitting: Family Medicine

## 2016-01-19 DIAGNOSIS — F329 Major depressive disorder, single episode, unspecified: Secondary | ICD-10-CM

## 2016-01-19 DIAGNOSIS — F32A Depression, unspecified: Secondary | ICD-10-CM

## 2016-04-03 ENCOUNTER — Encounter: Payer: Self-pay | Admitting: Family Medicine

## 2016-04-03 LAB — HM MAMMOGRAPHY

## 2016-04-10 ENCOUNTER — Other Ambulatory Visit: Payer: Self-pay

## 2016-04-10 MED ORDER — FLUOXETINE HCL 20 MG PO TABS: 20.0000 mg | ORAL_TABLET | Freq: Every day | ORAL | 1 refills | Status: DC

## 2016-04-10 NOTE — Telephone Encounter (Signed)
Refilled for 1 month with 1 refill, follow-up prior to that second refill running out.

## 2016-04-10 NOTE — Telephone Encounter (Signed)
Fax request for Fluoxetine 20mg  tabs;  Sig: take 1 tab by mouth every day.  Last refill given 30 with 1 refill 01/2016  Please advise

## 2016-04-11 NOTE — Telephone Encounter (Signed)
Gave pt message from Dr Carlota Raspberry, and she agreed to RTC to see him after Nutcracker performances end, after Dec 20.

## 2016-04-16 ENCOUNTER — Encounter: Payer: Self-pay | Admitting: Family Medicine

## 2016-04-18 ENCOUNTER — Other Ambulatory Visit: Payer: Self-pay | Admitting: Family Medicine

## 2016-04-18 DIAGNOSIS — F329 Major depressive disorder, single episode, unspecified: Secondary | ICD-10-CM

## 2016-04-18 DIAGNOSIS — F32A Depression, unspecified: Secondary | ICD-10-CM

## 2016-04-20 ENCOUNTER — Telehealth: Payer: Self-pay | Admitting: Family Medicine

## 2016-04-20 NOTE — Telephone Encounter (Signed)
Preoperative clearance form received from [facility] orthopedics. Will need to return for office visit to discuss clearance as other testing is likely needed including possible EKG, chest x-ray, and updated lab work prior to being able to clear her. Let me know if there are questions.

## 2016-04-20 NOTE — Telephone Encounter (Signed)
Dr. Carlota Raspberry, [place]...  Spoke to patient.  She states that she is "in the mist of production for the Nutcracker."  She will not be done with that until the 16th of December.  She will come to be seen after that date.  She will call to schedule an appointment.

## 2016-04-20 NOTE — Telephone Encounter (Signed)
Noted.  I assumed she was not planning on surgery until after that production. Thanks.

## 2016-05-11 ENCOUNTER — Telehealth: Payer: Self-pay

## 2016-05-12 NOTE — Telephone Encounter (Signed)
Please see prior phone note.

## 2016-06-14 ENCOUNTER — Inpatient Hospital Stay (HOSPITAL_COMMUNITY)
Admission: RE | Admit: 2016-06-14 | Discharge: 2016-06-14 | Disposition: A | Discharge status: Home / Self Care | Payer: Medicare PPO | Source: Ambulatory Visit

## 2016-06-14 NOTE — Pre-Procedure Instructions (Signed)
Kamaryn H Cedano  06/14/2016      [id number] - [place], [place] - [address] [place] [address] Phone: [phone] Fax: [place], [place] [address] Phone: [phone] Fax: [phone]  [facility] [place], [place] - [address] [place] [address] [place] [address] Phone: [phone] Fax: [phone]    Your procedure is scheduled on January 22nd, Monday   Report to [facility] Admitting at 10:15 AM            (posted surgery time 12:15 pm - 2:15 pm.)   Call this number if you have problems the MORNING of surgery:  [phone]   Remember:  Do not eat food or drink liquids after midnight Sunday.   Take these medicines the morning of surgery with A SIP OF WATER : Prozac   Do not wear jewelry, make-up or nail polish.  Do not wear lotions, powders, perfumes, or deoderant.   Do not shave 48 hours prior to surgery.    Do not bring valuables to the hospital.  [facility] is not responsible for any belongings or valuables.  Contacts, dentures or bridgework may not be worn into surgery.  Leave your suitcase in the car.  After surgery it may be brought to your room.  For patients admitted to the hospital, discharge time will be determined by your treatment team.  Please read over the following fact sheets that you were given. Pain Booklet, MRSA Information and Surgical Site Infection Prevention

## 2016-06-21 ENCOUNTER — Other Ambulatory Visit: Payer: Self-pay | Admitting: Family Medicine

## 2016-06-22 NOTE — Telephone Encounter (Signed)
Last seen 09/2015 needs ov

## 2016-06-29 ENCOUNTER — Telehealth: Payer: Self-pay

## 2016-06-29 NOTE — Telephone Encounter (Signed)
Surgery clearance originally for 1/22 - surgery rescheduled for 07/30/2016 - LMOVM for pt to call and get appt with Dr. Carlota Raspberry - Sx clearance form remains in nurses box at front.

## 2016-07-05 ENCOUNTER — Ambulatory Visit (INDEPENDENT_AMBULATORY_CARE_PROVIDER_SITE_OTHER): Payer: PPO

## 2016-07-05 ENCOUNTER — Encounter: Payer: Self-pay | Admitting: Family Medicine

## 2016-07-05 ENCOUNTER — Ambulatory Visit (INDEPENDENT_AMBULATORY_CARE_PROVIDER_SITE_OTHER): Payer: PPO | Admitting: Family Medicine

## 2016-07-05 VITALS — BP 147/84 | HR 104 | Temp 97.8°F | Resp 18 | Ht 65.0 in | Wt 187.0 lb

## 2016-07-05 DIAGNOSIS — Z01818 Encounter for other preprocedural examination: Secondary | ICD-10-CM

## 2016-07-05 DIAGNOSIS — Z136 Encounter for screening for cardiovascular disorders: Secondary | ICD-10-CM

## 2016-07-05 DIAGNOSIS — Z130 Encounter for screening for diseases of the blood and blood-forming organs and certain disorders involving the immune mechanism: Secondary | ICD-10-CM

## 2016-07-05 DIAGNOSIS — F329 Major depressive disorder, single episode, unspecified: Secondary | ICD-10-CM | POA: Diagnosis not present

## 2016-07-05 DIAGNOSIS — N3941 Urge incontinence: Secondary | ICD-10-CM

## 2016-07-05 DIAGNOSIS — F32A Depression, unspecified: Secondary | ICD-10-CM

## 2016-07-05 DIAGNOSIS — Z131 Encounter for screening for diabetes mellitus: Secondary | ICD-10-CM | POA: Diagnosis not present

## 2016-07-05 DIAGNOSIS — Z23 Encounter for immunization: Secondary | ICD-10-CM | POA: Diagnosis not present

## 2016-07-05 MED ORDER — OXYBUTYNIN CHLORIDE 5 MG PO TABS: 2.5000 mg | ORAL_TABLET | Freq: Three times a day (TID) | ORAL | 3 refills | Status: DC

## 2016-07-05 MED ORDER — FLUOXETINE HCL 20 MG PO TABS: 20.0000 mg | ORAL_TABLET | Freq: Every day | ORAL | 2 refills | Status: DC

## 2016-07-05 NOTE — Progress Notes (Signed)
By signing my name below, I, Mesha Guinyard, attest that this documentation has been prepared under the direction and in the presence of [name], MD.  Electronically Signed: Verlee Monte, Medical Scribe. 07/05/16. 3:53 PM.  Subjective:    Patient ID: Zoe Kim, female    DOB: 06/29/1945, 70 y.o.   MRN: [id number]  HPI Chief Complaint  Patient presents with  . OTHER    pt would like surigal clearance, she is having knee surgery at the end of month    HPI Comments: Zoe Kim is a 71 y.o. female who presents to the [facility] for preoperative evaluation at the end of this month. She has a hx of depression, treated with prozac in the past - last discussed April 2017, she had tried a different SSRI but did not tolerated zoloft. Restarted prozac 20 mg QD at April visit. She was seen for right medial knee pain in July, referred to orthopedics, started on mobic, and unloader brace prescribed. Pre-operative paperwork received in November but due to work obligations, could not return until today. Her surgeon is Dr. Noemi Chapel with plan for right total knee on Feb 26th. She had a treadmill stress test in Jan 2014 - 4.9 METs, negative stress testing, negative for ischemia. Last chest x-ray April 2017 - no active cardio pulmonary disease.  Surgical Clearance: Pt's last surgery was in 1982 and she had anesthesia for vein ligation and stripping- no complication with the anesthesia. Pt states walking up the stairs, feeding horses, and loading hay doesn't cause chest pain, chest tightness, or SOB. Pt denies chest pain, chest tightness, and SOB while exercising. Denies calf pain.  Depression: Pt can't tell the difference while on prozac. She suspects she was born depressed and wonders if she could just be depressed without prozac. Denies negative side effects while on prozac.  Urinary Incontinence: Pt takes half a pill of ditropan in the morning, and a whole pill  before her 6 pm class without relief of her incontinence. Pt continues to do kegels. She states a whole pill doesn't make her dizzy.  There are no active problems to display for this patient.  Past Medical History:  Diagnosis Date  . Arthritis    Past Surgical History:  Procedure Laterality Date  . KNEE SURGERY    . TONSILLECTOMY    . VEIN LIGATION AND STRIPPING     Allergies  Allergen Reactions  . Sulfa Antibiotics Other (See Comments)    unknown   Prior to Admission medications   Medication Sig Start Date End Date Taking? Authorizing Provider  acetaminophen (TYLENOL) 500 MG tablet Take 1,000 mg by mouth every 6 (six) hours as needed for moderate pain.   Yes Historical Provider, MD  FLUoxetine (PROZAC) 20 MG tablet TAKE 1 TABLET BY MOUTH EVERY DAY 06/22/16  Yes [name], MD  MELATONIN PO Take 1 tablet by mouth at bedtime as needed (sleep).   Yes Historical Provider, MD  oxybutynin (DITROPAN) 5 MG tablet TAKE 1/2 TABLET BY MOUTH 3 TIMES A DAY as needed. Patient taking differently: Take 2.5 mg by mouth 3 (three) times daily.  07/27/15  Yes [name], MD  fexofenadine (ALLEGRA) 180 MG tablet Take 1 tablet (180 mg total) by mouth daily. Taking otc Patient not taking: Reported on 06/08/2016 09/14/15   [name], MD  meloxicam (MOBIC) 7.5 MG tablet Take 1 tablet (7.5 mg total) by mouth daily. Patient not taking: Reported on 06/08/2016  12/08/15   [name], MD   Social History   Social History  . Marital status: Married    Spouse name: N/A  . Number of children: N/A  . Years of education: N/A   Occupational History  . Not on file.   Social History Main Topics  . Smoking status: Never Smoker  . Smokeless tobacco: Never Used  . Alcohol use No  . Drug use: No  . Sexual activity: Yes   Other Topics Concern  . Not on file   Social History Narrative  . No narrative on file   Review of Systems  Respiratory: Negative for chest tightness and shortness of  breath.   Cardiovascular: Negative for chest pain.  Musculoskeletal: Positive for arthralgias. Negative for myalgias.  Psychiatric/Behavioral: Positive for dysphoric mood (chronic).   Objective:  Physical Exam  Constitutional: She appears well-developed and well-nourished. No distress.  HENT:  Head: Normocephalic and atraumatic.  Eyes: Conjunctivae are normal.  Neck: Neck supple.  Cardiovascular: Normal rate, regular rhythm and normal heart sounds.  Exam reveals no gallop and no friction rub.   No murmur heard. Pulmonary/Chest: Effort normal and breath sounds normal. No respiratory distress. She has no wheezes. She has no rales.  Musculoskeletal: She exhibits edema (trace lower extremity, L>R).  Neurological: She is alert.  Skin: Skin is warm and dry.  Psychiatric: She has a normal mood and affect. Her behavior is normal.  Nursing note and vitals reviewed.  BP (!) 147/84   Pulse (!) 104   Temp 97.8 F (36.6 C) (Oral)   Resp 18   Ht 5\' 5"  (1.651 m)   Wt 187 lb (84.8 kg)   SpO2 98%   BMI 31.12 kg/m    [EKG Reading]: Sinus rhythm. No apparent changes from Dec 2013 Assessment & Plan:   Zoe Kim is a 71 y.o. female Preoperative clearance - Plan: CBC, DG Chest 2 View Screening for cardiovascular condition - Plan: EKG 12-Lead, DG Chest 2 View Screening for diabetes mellitus - Plan: Basic metabolic panel Screening, anemia, deficiency, iron - Plan: CBC  - no apparent increased risk of major cardiac adverse event on history. EKG without concerning findings. CXR and lab obtained.  Paperwork completed for ortho after review of these studies. See result notes.   Urge incontinence - Plan: oxybutynin (DITROPAN) 5 MG tablet  - overall stable, but can try anywhere from 1/2 to 1 pill at a time, with potential side effects discussed.   Depression, unspecified depression type - Plan: FLUoxetine (PROZAC) 20 MG tablet  - fair control. Option of increase to 40mg  QD discussed.  Call  if remains at this dose.   Flu vaccine need - Plan: Flu Vaccine QUAD 36+ mos IM given.    Meds ordered this encounter  Medications  . FLUoxetine (PROZAC) 20 MG tablet    Sig: Take 1 tablet (20 mg total) by mouth daily. Can increase to 2 per day if needed.    Dispense:  60 tablet    Refill:  2  . oxybutynin (DITROPAN) 5 MG tablet    Sig: Take 0.5-1 tablets (2.5-5 mg total) by mouth 3 (three) times daily.    Dispense:  90 tablet    Refill:  3   Patient Instructions   Flu vaccine given today.  I refilled the Prozac at 20 mg, but you can increase that to 2 per day to see if that helps depression symptoms more than the once per day prescription. You may  want to wait until after your surgery to make that change.    Okay to continue the Ditropan 1/2-1 pill up to 3 times per day. Watch for lightheadedness, dizziness or unsteadiness as you go up on that dose. Dry mouth may be another side effect.  I do not hear any concerns on your exam today in regards to your upcoming surgery. I will check a chest x-ray, blood count and electrolytes, but suspect those will also be okay. Good luck with your surgery and let me know if there are any questions in the meantime.    IF you received an x-ray today, you will receive an invoice from [facility]. Please contact [facility] at [phone] with questions or concerns regarding your invoice.   IF you received labwork today, you will receive an invoice from [facility]. Please contact [facility] at [phone] with questions or concerns regarding your invoice.   Our billing staff will not be able to assist you with questions regarding bills from these companies.  You will be contacted with the lab results as soon as they are available. The fastest way to get your results is to activate your My Chart account. Instructions are located on the last page of this paperwork. If you have not heard from [place] regarding the results in 2 weeks, please  contact this office.       I personally performed the services described in this documentation, which was scribed in my presence. The recorded information has been reviewed and considered for accuracy and completeness, addended by me as needed, and agree with information above.  Signed,   [name], MD [facility].  07/22/16 11:17 PM

## 2016-07-05 NOTE — Patient Instructions (Addendum)
Flu vaccine given today.  I refilled the Prozac at 20 mg, but you can increase that to 2 per day to see if that helps depression symptoms more than the once per day prescription. You may want to wait until after your surgery to make that change.    Okay to continue the Ditropan 1/2-1 pill up to 3 times per day. Watch for lightheadedness, dizziness or unsteadiness as you go up on that dose. Dry mouth may be another side effect.  I do not hear any concerns on your exam today in regards to your upcoming surgery. I will check a chest x-ray, blood count and electrolytes, but suspect those will also be okay. Good luck with your surgery and let me know if there are any questions in the meantime.    IF you received an x-ray today, you will receive an invoice from [facility]. Please contact [facility] at [phone] with questions or concerns regarding your invoice.   IF you received labwork today, you will receive an invoice from [facility]. Please contact [facility] at [phone] with questions or concerns regarding your invoice.   Our billing staff will not be able to assist you with questions regarding bills from these companies.  You will be contacted with the lab results as soon as they are available. The fastest way to get your results is to activate your My Chart account. Instructions are located on the last page of this paperwork. If you have not heard from [place] regarding the results in 2 weeks, please contact this office.

## 2016-07-06 LAB — BASIC METABOLIC PANEL
BUN / CREAT RATIO: 12 (ref 12–28)
BUN: 10 mg/dL (ref 8–27)
CALCIUM: 9.6 mg/dL (ref 8.7–10.3)
CO2: 24 mmol/L (ref 18–29)
Chloride: 99 mmol/L (ref 96–106)
Creatinine, Ser: 0.82 mg/dL (ref 0.57–1.00)
GFR, EST AFRICAN AMERICAN: 83 mL/min/{1.73_m2} (ref >59)
GFR, EST NON AFRICAN AMERICAN: 72 mL/min/{1.73_m2} (ref >59)
Glucose: 114 mg/dL — ABNORMAL HIGH (ref 65–99)
Potassium: 3.9 mmol/L (ref 3.5–5.2)
Sodium: 141 mmol/L (ref 134–144)

## 2016-07-06 LAB — CBC
HEMATOCRIT: 37.8 % (ref 34.0–46.6)
HEMOGLOBIN: 12.5 g/dL (ref 11.1–15.9)
MCH: 28.7 pg (ref 26.6–33.0)
MCHC: 33.1 g/dL (ref 31.5–35.7)
MCV: 87 fL (ref 79–97)
Platelets: 282 10*3/uL (ref 150–379)
RBC: 4.35 x10E6/uL (ref 3.77–5.28)
RDW: 13.8 % (ref 12.3–15.4)
WBC: 6.1 10*3/uL (ref 3.4–10.8)

## 2016-07-18 ENCOUNTER — Encounter (HOSPITAL_COMMUNITY): Payer: Self-pay | Admitting: Physician Assistant

## 2016-07-18 DIAGNOSIS — M1711 Unilateral primary osteoarthritis, right knee: Secondary | ICD-10-CM | POA: Diagnosis not present

## 2016-07-18 DIAGNOSIS — F419 Anxiety disorder, unspecified: Secondary | ICD-10-CM | POA: Diagnosis present

## 2016-07-18 DIAGNOSIS — R3915 Urgency of urination: Secondary | ICD-10-CM | POA: Diagnosis present

## 2016-07-18 NOTE — H&P (Signed)
TOTAL KNEE ADMISSION H&P  Patient is being admitted for right total knee arthroplasty.  Subjective:  Chief Complaint:right knee pain.  HPI: Zoe Kim, 71 y.o. female, has a history of pain and functional disability in the right knee due to arthritis and has failed non-surgical conservative treatments for greater than 12 weeks to includeNSAID's and/or analgesics, corticosteriod injections, viscosupplementation injections, flexibility and strengthening excercises and supervised PT with diminished ADL's post treatment.  Onset of symptoms was gradual, starting 10 years ago with gradually worsening course since that time. The patient noted no past surgery on the right knee(s).  Patient currently rates pain in the right knee(s) at 10 out of 10 with activity. Patient has night pain, worsening of pain with activity and weight bearing, pain that interferes with activities of daily living, crepitus and joint swelling.  Patient has evidence of subchondral sclerosis, periarticular osteophytes and joint space narrowing by imaging studies. There is no active infection.  Patient Active Problem List   Diagnosis Date Noted  . Anxiety   . Primary localized osteoarthrosis of the knee, right   . Urinary urgency    Past Medical History:  Diagnosis Date  . Anxiety   . Arthritis   . Primary localized osteoarthrosis of the knee, right   . Urinary urgency     Past Surgical History:  Procedure Laterality Date  . KNEE SURGERY    . TONSILLECTOMY    . VEIN LIGATION AND STRIPPING     No current facility-administered medications for this encounter.   Current Outpatient Prescriptions:  .  acetaminophen (TYLENOL) 500 MG tablet, Take 1,000 mg by mouth every 6 (six) hours as needed for moderate pain., Disp: , Rfl:  .  FLUoxetine (PROZAC) 20 MG tablet, Take 1 tablet (20 mg total) by mouth daily. Can increase to 2 per day if needed., Disp: 60 tablet, Rfl: 2 .  HYDROcodone-acetaminophen (NORCO/VICODIN) 5-325  MG tablet, Take 1 tablet by mouth every 8 (eight) hours as needed for moderate pain., Disp: , Rfl:  .  MELATONIN PO, Take 1 tablet by mouth at bedtime as needed (sleep)., Disp: , Rfl:  .  oxybutynin (DITROPAN) 5 MG tablet, Take 0.5-1 tablets (2.5-5 mg total) by mouth 3 (three) times daily., Disp: 90 tablet, Rfl: 3   Allergies  Allergen Reactions  . Sulfa Antibiotics Other (See Comments)    unknown    Social History  Substance Use Topics  . Smoking status: Never Smoker  . Smokeless tobacco: Never Used  . Alcohol use No    Family History  Problem Relation Age of Onset  . Heart disease Mother   . COPD Mother   . Cancer Mother   . Heart disease Father   . COPD Brother   . Heart disease Brother   . Cancer Brother   . Cancer Maternal Grandmother     breast  . Stroke Maternal Grandfather      Review of Systems  Constitutional: Negative.   HENT: Negative.   Eyes: Negative.   Respiratory: Negative.   Cardiovascular: Negative.   Gastrointestinal: Negative.   Genitourinary: Positive for urgency. Negative for dysuria, flank pain, frequency and hematuria.  Musculoskeletal: Positive for joint pain.       Right knee  Skin: Negative.   Neurological: Negative.   Endo/Heme/Allergies: Negative.   Psychiatric/Behavioral: Negative.     Objective:  Physical Exam  Constitutional: She is oriented to person, place, and time. She appears well-developed and well-nourished.  HENT:  Head: Normocephalic and atraumatic.  Mouth/Throat: Oropharynx is clear and moist.  Eyes: Conjunctivae are normal. Pupils are equal, round, and reactive to light.  Neck: Neck supple.  Cardiovascular: Normal rate and regular rhythm.   Murmur heard. Respiratory: Effort normal and breath sounds normal.  GI: Soft. Bowel sounds are normal.  Genitourinary:  Genitourinary Comments: Not pertinent to current symptomatology therefore not examined.  Musculoskeletal:  Examination of her right knee reveals pain medially  and laterally.  1+ crepitation.  1+ synovitis.  Full range of motion.  Knee is stable with normal patella tracking.  Examination of her left knee reveals full range of motion without pain, swelling, weakness or instability.  Vascular exam: Pulses are 2+ and symmetric.  Neurologic exam: Distal motor and sensory examination is within normal limits.    Neurological: She is alert and oriented to person, place, and time.  Skin: Skin is warm and dry.  Psychiatric: She has a normal mood and affect.    Vital signs in last 24 hours: Temp:  [98.8 F (37.1 C)] 98.8 F (37.1 C) (02/14 1400) Pulse Rate:  [74] 74 (02/14 1400) BP: (147)/(76) 147/76 (02/14 1400) SpO2:  [96 %] 96 % (02/14 1400) Weight:  [86.2 kg (190 lb)] 86.2 kg (190 lb) (02/14 1400)  Labs:   Estimated body mass index is 31.62 kg/m as calculated from the following:   Height as of this encounter: 5\' 5"  (1.651 m).   Weight as of this encounter: 86.2 kg (190 lb).   Imaging Review Plain radiographs demonstrate severe degenerative joint disease of the right knee(s). The overall alignment issignificant varus. The bone quality appears to be good for age and reported activity level.  Assessment/Plan:  End stage arthritis, right knee  Principal Problem:   Primary localized osteoarthrosis of the knee, right Active Problems:   Anxiety   Urinary urgency   The patient history, physical examination, clinical judgment of the provider and imaging studies are consistent with end stage degenerative joint disease of the right knee(s) and total knee arthroplasty is deemed medically necessary. The treatment options including medical management, injection therapy arthroscopy and arthroplasty were discussed at length. The risks and benefits of total knee arthroplasty were presented and reviewed. The risks due to aseptic loosening, infection, stiffness, patella tracking problems, thromboembolic complications and other imponderables were discussed. The  patient acknowledged the explanation, agreed to proceed with the plan and consent was signed. Patient is being admitted for inpatient treatment for surgery, pain control, PT, OT, prophylactic antibiotics, VTE prophylaxis, progressive ambulation and ADL's and discharge planning. The patient is planning to be discharged home with home health services

## 2016-07-19 ENCOUNTER — Encounter (HOSPITAL_COMMUNITY): Payer: Self-pay

## 2016-07-19 ENCOUNTER — Encounter (HOSPITAL_COMMUNITY)
Admission: RE | Admit: 2016-07-19 | Discharge: 2016-07-19 | Disposition: A | Discharge status: Home / Self Care | Payer: PPO | Source: Ambulatory Visit | Attending: Orthopedic Surgery | Admitting: Orthopedic Surgery

## 2016-07-19 DIAGNOSIS — I517 Cardiomegaly: Secondary | ICD-10-CM | POA: Insufficient documentation

## 2016-07-19 DIAGNOSIS — Z01818 Encounter for other preprocedural examination: Secondary | ICD-10-CM | POA: Insufficient documentation

## 2016-07-19 DIAGNOSIS — M1711 Unilateral primary osteoarthritis, right knee: Secondary | ICD-10-CM | POA: Insufficient documentation

## 2016-07-19 DIAGNOSIS — Z01810 Encounter for preprocedural cardiovascular examination: Secondary | ICD-10-CM | POA: Diagnosis not present

## 2016-07-19 DIAGNOSIS — I444 Left anterior fascicular block: Secondary | ICD-10-CM | POA: Diagnosis not present

## 2016-07-19 HISTORY — DX: Cardiac murmur, unspecified: R01.1

## 2016-07-19 HISTORY — DX: Pneumonia, unspecified organism: J18.9

## 2016-07-19 HISTORY — DX: Depression, unspecified: F32.A

## 2016-07-19 HISTORY — DX: Major depressive disorder, single episode, unspecified: F32.9

## 2016-07-19 HISTORY — DX: Insomnia, unspecified: G47.00

## 2016-07-19 LAB — CBC WITH DIFFERENTIAL/PLATELET
BASOS ABS: 0 10*3/uL (ref 0.0–0.1)
BASOS PCT: 1 %
Eosinophils Absolute: 0.1 10*3/uL (ref 0.0–0.7)
Eosinophils Relative: 2 %
HEMATOCRIT: 35.6 % — AB (ref 36.0–46.0)
Hemoglobin: 12.1 g/dL (ref 12.0–15.0)
LYMPHS PCT: 30 %
Lymphs Abs: 1.9 10*3/uL (ref 0.7–4.0)
MCH: 29.6 pg (ref 26.0–34.0)
MCHC: 34 g/dL (ref 30.0–36.0)
MCV: 87 fL (ref 78.0–100.0)
MONO ABS: 0.6 10*3/uL (ref 0.1–1.0)
Monocytes Relative: 9 %
NEUTROS ABS: 3.6 10*3/uL (ref 1.7–7.7)
NEUTROS PCT: 58 %
Platelets: 250 10*3/uL (ref 150–400)
RBC: 4.09 MIL/uL (ref 3.87–5.11)
RDW: 13.2 % (ref 11.5–15.5)
WBC: 6.3 10*3/uL (ref 4.0–10.5)

## 2016-07-19 LAB — COMPREHENSIVE METABOLIC PANEL
ALBUMIN: 3.9 g/dL (ref 3.5–5.0)
ALT: 18 U/L (ref 14–54)
AST: 22 U/L (ref 15–41)
Alkaline Phosphatase: 105 U/L (ref 38–126)
Anion gap: 10 (ref 5–15)
BILIRUBIN TOTAL: 1.1 mg/dL (ref 0.3–1.2)
BUN: 13 mg/dL (ref 6–20)
CHLORIDE: 103 mmol/L (ref 101–111)
CO2: 25 mmol/L (ref 22–32)
CREATININE: 0.82 mg/dL (ref 0.44–1.00)
Calcium: 9.1 mg/dL (ref 8.9–10.3)
GFR calc Af Amer: >60 mL/min (ref >60)
GFR calc non Af Amer: >60 mL/min (ref >60)
GLUCOSE: 93 mg/dL (ref 65–99)
POTASSIUM: 4.3 mmol/L (ref 3.5–5.1)
Sodium: 138 mmol/L (ref 135–145)
TOTAL PROTEIN: 6.7 g/dL (ref 6.5–8.1)

## 2016-07-19 LAB — TYPE AND SCREEN
ABO/RH(D): O POS
ANTIBODY SCREEN: NEGATIVE

## 2016-07-19 LAB — ABO/RH: ABO/RH(D): O POS

## 2016-07-19 LAB — SURGICAL PCR SCREEN
MRSA, PCR: NEGATIVE
STAPHYLOCOCCUS AUREUS: NEGATIVE

## 2016-07-19 LAB — PROTIME-INR
INR: 1
Prothrombin Time: 13.2 seconds (ref 11.4–15.2)

## 2016-07-19 LAB — APTT: APTT: 31 s (ref 24–36)

## 2016-07-19 NOTE — Progress Notes (Signed)
Zoe Kim was seen by Dr Einar Gip in 2014 for chest pain and mild palpations. Patient had a exercise stress test which she said was normal.  Patient was expericing a lot of stress during 2013/2013.  Patient denies chest pain since then.

## 2016-07-19 NOTE — Pre-Procedure Instructions (Signed)
    Zoe Kim  07/19/2016    Your procedure is scheduled on Monday, February 26.  Report to [facility] Admitting at 5:30 AM.               Your surgery or procedure is scheduled for 7:15 AM   Call this number if you have problems the morning of surgery:[phone]               For any other questions, please call [phone], Monday - Friday 8 AM - 4 PM.    Remember:  Do not eat food or drink liquids after midnight Sunday, February 25.  Take these medicines the morning of surgery with A SIP OF WATER: FLUoxetine (PROZAC) .                   May take Tylenol or Hydrocodone- Acetaminophen if needed and you tolerate on an empty stomach.   Do not wear jewelry, make-up or nail polish.  Do not wear lotions, powders, or perfumes, or deodorant.  Do not shave 48 hours prior to surgery.  Do not bring valuables to the hospital.  [facility] is not responsible for any belongings or valuables.  Contacts, dentures or bridgework may not be worn into surgery.  Leave your suitcase in the car.  After surgery it may be brought to your room.  For patients admitted to the hospital, discharge time will be determined by your treatment team.  Special instructions: Review  [facility] - Preparing For Surgery.  Please read over the following fact sheets that you were given. [facility]- Preparing For Surgery and Patient Instructions for Mupirocin Application, Incentive Spirometry, Pain Booklet

## 2016-07-23 ENCOUNTER — Telehealth: Payer: Self-pay | Admitting: Family Medicine

## 2016-07-23 NOTE — Telephone Encounter (Signed)
PA from [facility] called about pt abnormal results I pass call onto [facility]

## 2016-07-23 NOTE — Progress Notes (Addendum)
Anesthesia chart review: Patient is a 71 year old female scheduled for right TKR on 07/30/2016 by Dr. Noemi Chapel.   History includes never smoker, arthritis, depression, anxiety, murmur "years ago" (no significant valvular disease on 2014 echo), insomnia, varicose vein ligation and stripping 1982, tonsillectomy. BMI is consistent with mild obesity.  PCP is Dr. [name], last visit 07/05/16 for surgery clearance. If preoperative test results were acceptable he did think there were any concerns regarding proceeding with surgery.   Cardiologist is Dr. Einar Gip at [facility], seen in 2014 for evaluation of vague chest pain. ETT was non-ischemic. Echo showed normal EF and wall motion.  Meds include Prozac, Norco, melatonin, Ditropan.  BP 112/88 (BP Location: Right Arm)   Pulse 80   Temp 36.8 C   Resp 18   Ht 5\' 5"  (1.651 m)   Wt 191 lb 6.4 oz (86.8 kg)   SpO2 97%   BMI 31.85 kg/m   EKG 07/19/2016: Normal sinus rhythm, possible left atrial enlargement, left anterior fascicular block, LVH. P waves are inverted in V1 and V2, otherwise tracings appear similar to 07/05/16 (done as part of PCP pre-op eval) and 05/25/12.   Echo 06/23/12 [facility]): Conclusion: 1. Left ventricular cavity is normal in size. Normal global wall motion. Normal systolic global function. Calculated EF 69%. Abnormal relaxation. 2. Mitral valve structurally normal. Mitral valve inflow A > E ratio. 3. Tricuspid valve structurally normal. Trace tricuspid regurgitation. 4. Normal trileaflet aortic valve, with normal systolic flow pattern, with no regurgitation noted.  Exercise stress test 06/26/12 [facility]): Conclusions: Negative for ischemia. Hypertensive blood pressure response. Markedly reduced aerobic tolerance.  Preoperative labs noted. Patient was unable to provide a urine specimen at PAT, so she is going to come by between now and her surgery date to provide a specimen. RN to call and remind patient today. Chart will  be left for nurse follow-up re: urine culture.  George Hugh [facility]/Anesthesiology Phone [phone] 07/23/2016 2:21 PM  Addendum: Dr. Archie Endo office requested that patient be seen for preoperative cardiology evaluation. She was seen by Jeri Lager, FNP-C at [facility] on 07/24/16. It was not felt that patient would require pre-operative nuclear stress test unless she desired one to be done. Patient declined as she was not having any CV symptoms and had unremarkable testing within the past five years. She was felt to be low risk for CAD and "may proceed with surgery as planned." Dr. Einar Gip was in agreement. PRN cardiology follow-up recommended.   Patient also brought in a urine specimen for culture on 07/25/16 that showed multiple species present, suggest recollection. Will defer decision for repeat urine culture to surgeon.   If no acute changes then I anticipate that she can proceed as planned from an anesthesia standpoint.  George Hugh [facility]/Anesthesiology Phone [phone] 07/26/2016 4:59 PM

## 2016-07-23 NOTE — Progress Notes (Signed)
I called patient and left a message to remind patient to bring urine specimen to the hospital.

## 2016-07-24 DIAGNOSIS — E669 Obesity, unspecified: Secondary | ICD-10-CM | POA: Diagnosis not present

## 2016-07-24 DIAGNOSIS — Z01818 Encounter for other preprocedural examination: Secondary | ICD-10-CM | POA: Diagnosis not present

## 2016-07-24 DIAGNOSIS — R9431 Abnormal electrocardiogram [ECG] [EKG]: Secondary | ICD-10-CM | POA: Diagnosis not present

## 2016-07-25 DIAGNOSIS — M1711 Unilateral primary osteoarthritis, right knee: Secondary | ICD-10-CM | POA: Diagnosis not present

## 2016-07-25 DIAGNOSIS — F329 Major depressive disorder, single episode, unspecified: Secondary | ICD-10-CM | POA: Diagnosis not present

## 2016-07-25 DIAGNOSIS — F419 Anxiety disorder, unspecified: Secondary | ICD-10-CM | POA: Diagnosis not present

## 2016-07-25 DIAGNOSIS — Z6831 Body mass index (BMI) 31.0-31.9, adult: Secondary | ICD-10-CM | POA: Diagnosis not present

## 2016-07-25 DIAGNOSIS — M25561 Pain in right knee: Secondary | ICD-10-CM | POA: Diagnosis not present

## 2016-07-25 DIAGNOSIS — E669 Obesity, unspecified: Secondary | ICD-10-CM | POA: Diagnosis not present

## 2016-07-25 DIAGNOSIS — Z79899 Other long term (current) drug therapy: Secondary | ICD-10-CM | POA: Diagnosis not present

## 2016-07-25 DIAGNOSIS — R3915 Urgency of urination: Secondary | ICD-10-CM | POA: Diagnosis not present

## 2016-07-26 LAB — URINE CULTURE

## 2016-07-27 MED ORDER — LACTATED RINGERS IV SOLN: INTRAVENOUS | Status: DC

## 2016-07-27 MED ORDER — CEFAZOLIN SODIUM-DEXTROSE 2-4 GM/100ML-% IV SOLN
2.0000 g | INTRAVENOUS | Status: AC
  Administered 2016-07-30: 2 g via INTRAVENOUS
  Filled 2016-07-27: qty 100

## 2016-07-27 MED ORDER — TRANEXAMIC ACID 1000 MG/10ML IV SOLN
1000.0000 mg | INTRAVENOUS | Status: AC
  Administered 2016-07-30: 1000 mg via INTRAVENOUS
  Filled 2016-07-27 (×2): qty 10

## 2016-07-29 ENCOUNTER — Encounter (HOSPITAL_COMMUNITY): Payer: Self-pay | Admitting: Anesthesiology

## 2016-07-29 NOTE — Anesthesia Preprocedure Evaluation (Addendum)
Anesthesia Evaluation  Patient identified by MRN, date of birth, ID band Patient awake    Reviewed: Allergy & Precautions, NPO status , Patient's Chart, lab work & pertinent test results  Airway Mallampati: II  TM Distance: >3 FB Neck ROM: Full    Dental no notable dental hx. (+) Teeth Intact   Pulmonary pneumonia, resolved,    Pulmonary exam normal breath sounds clear to auscultation       Cardiovascular negative cardio ROS Normal cardiovascular exam+ Valvular Problems/Murmurs  Rhythm:Regular Rate:Normal     Neuro/Psych PSYCHIATRIC DISORDERS Anxiety Depression negative neurological ROS     GI/Hepatic negative GI ROS, Neg liver ROS,   Endo/Other  Obesity  Renal/GU negative Renal ROS Bladder dysfunction      Musculoskeletal  (+) Arthritis , Osteoarthritis,  DJD Right knee   Abdominal (+) + obese,   Peds  Hematology negative hematology ROS (+)   Anesthesia Other Findings   Reproductive/Obstetrics                             Chemistry      Component Value Date/Time   NA 138 07/19/2016 1258   NA 141 07/05/2016 1642   K 4.3 07/19/2016 1258   CL 103 07/19/2016 1258   CO2 25 07/19/2016 1258   BUN 13 07/19/2016 1258   BUN 10 07/05/2016 1642   CREATININE 0.82 07/19/2016 1258   CREATININE 0.86 05/22/2012 1415      Component Value Date/Time   CALCIUM 9.1 07/19/2016 1258   ALKPHOS 105 07/19/2016 1258   AST 22 07/19/2016 1258   ALT 18 07/19/2016 1258   BILITOT 1.1 07/19/2016 1258     Lab Results  Component Value Date   WBC 6.3 07/19/2016   HGB 12.1 07/19/2016   HCT 35.6 (L) 07/19/2016   MCV 87.0 07/19/2016   PLT 250 07/19/2016   EKG: normal sinus rhythm, LAD, LAFB, LVH.  Anesthesia Physical Anesthesia Plan  ASA: II  Anesthesia Plan: Spinal   Post-op Pain Management:    Induction:   Airway Management Planned: Natural Airway, Nasal Cannula and Simple Face  Mask  Additional Equipment:   Intra-op Plan:   Post-operative Plan: Extubation in OR  Informed Consent: I have reviewed the patients History and Physical, chart, labs and discussed the procedure including the risks, benefits and alternatives for the proposed anesthesia with the patient or authorized representative who has indicated his/her understanding and acceptance.     Plan Discussed with: CRNA, Anesthesiologist and Surgeon  Anesthesia Plan Comments:        Anesthesia Quick Evaluation

## 2016-07-30 ENCOUNTER — Encounter (HOSPITAL_COMMUNITY)
Admission: RE | Disposition: A | Discharge status: Home Health | Payer: Self-pay | Source: Ambulatory Visit | Attending: Orthopedic Surgery

## 2016-07-30 ENCOUNTER — Inpatient Hospital Stay (HOSPITAL_COMMUNITY): Payer: PPO | Admitting: Vascular Surgery

## 2016-07-30 ENCOUNTER — Inpatient Hospital Stay (HOSPITAL_COMMUNITY): Payer: PPO | Admitting: Anesthesiology

## 2016-07-30 ENCOUNTER — Inpatient Hospital Stay (HOSPITAL_COMMUNITY)
Admission: RE | Admit: 2016-07-30 | Discharge: 2016-08-01 | DRG: 470 | Disposition: A | Discharge status: Home Health | Payer: PPO | Source: Ambulatory Visit | Attending: Orthopedic Surgery | Admitting: Orthopedic Surgery

## 2016-07-30 DIAGNOSIS — M1711 Unilateral primary osteoarthritis, right knee: Secondary | ICD-10-CM | POA: Diagnosis not present

## 2016-07-30 DIAGNOSIS — Z6831 Body mass index (BMI) 31.0-31.9, adult: Secondary | ICD-10-CM | POA: Diagnosis not present

## 2016-07-30 DIAGNOSIS — E669 Obesity, unspecified: Secondary | ICD-10-CM | POA: Diagnosis present

## 2016-07-30 DIAGNOSIS — F419 Anxiety disorder, unspecified: Secondary | ICD-10-CM | POA: Diagnosis present

## 2016-07-30 DIAGNOSIS — R011 Cardiac murmur, unspecified: Secondary | ICD-10-CM | POA: Diagnosis not present

## 2016-07-30 DIAGNOSIS — M25561 Pain in right knee: Secondary | ICD-10-CM | POA: Diagnosis not present

## 2016-07-30 DIAGNOSIS — Z79899 Other long term (current) drug therapy: Secondary | ICD-10-CM

## 2016-07-30 DIAGNOSIS — F418 Other specified anxiety disorders: Secondary | ICD-10-CM | POA: Diagnosis not present

## 2016-07-30 DIAGNOSIS — F329 Major depressive disorder, single episode, unspecified: Secondary | ICD-10-CM | POA: Diagnosis not present

## 2016-07-30 DIAGNOSIS — R3915 Urgency of urination: Secondary | ICD-10-CM | POA: Diagnosis not present

## 2016-07-30 DIAGNOSIS — Z96651 Presence of right artificial knee joint: Secondary | ICD-10-CM | POA: Diagnosis not present

## 2016-07-30 DIAGNOSIS — G8918 Other acute postprocedural pain: Secondary | ICD-10-CM | POA: Diagnosis not present

## 2016-07-30 HISTORY — DX: Anxiety disorder, unspecified: F41.9

## 2016-07-30 HISTORY — DX: Unilateral primary osteoarthritis, right knee: M17.11

## 2016-07-30 HISTORY — DX: Urgency of urination: R39.15

## 2016-07-30 HISTORY — PX: TOTAL KNEE ARTHROPLASTY: SHX125

## 2016-07-30 SURGERY — ARTHROPLASTY, KNEE, TOTAL
Anesthesia: Spinal | Site: Knee | Laterality: Right

## 2016-07-30 MED ORDER — BUPIVACAINE-EPINEPHRINE 0.25% -1:200000 IJ SOLN
INTRAMUSCULAR | Status: DC | PRN
  Administered 2016-07-30: 30 mL

## 2016-07-30 MED ORDER — MENTHOL 3 MG MT LOZG: 1.0000 | LOZENGE | OROMUCOSAL | Status: DC | PRN

## 2016-07-30 MED ORDER — DEXAMETHASONE SODIUM PHOSPHATE 10 MG/ML IJ SOLN
INTRAMUSCULAR | Status: AC
  Filled 2016-07-30: qty 1

## 2016-07-30 MED ORDER — EPINEPHRINE PF 1 MG/ML IJ SOLN
INTRAMUSCULAR | Status: AC
  Filled 2016-07-30: qty 1

## 2016-07-30 MED ORDER — ONDANSETRON HCL 4 MG/2ML IJ SOLN
INTRAMUSCULAR | Status: DC | PRN
  Administered 2016-07-30: 4 mg via INTRAVENOUS

## 2016-07-30 MED ORDER — APIXABAN 2.5 MG PO TABS
2.5000 mg | ORAL_TABLET | Freq: Two times a day (BID) | ORAL | Status: DC
  Administered 2016-07-31 – 2016-08-01 (×3): 2.5 mg via ORAL
  Filled 2016-07-30 (×3): qty 1

## 2016-07-30 MED ORDER — POTASSIUM CHLORIDE IN NACL 20-0.9 MEQ/L-% IV SOLN
INTRAVENOUS | Status: DC
  Administered 2016-07-30: 20:00:00 via INTRAVENOUS
  Filled 2016-07-30 (×2): qty 1000

## 2016-07-30 MED ORDER — PHENOL 1.4 % MT LIQD
1.0000 | OROMUCOSAL | Status: DC | PRN
Start: 2016-07-30 — End: 2016-08-01

## 2016-07-30 MED ORDER — METOCLOPRAMIDE HCL 5 MG PO TABS: 5.0000 mg | ORAL_TABLET | Freq: Three times a day (TID) | ORAL | Status: DC | PRN

## 2016-07-30 MED ORDER — ONDANSETRON HCL 4 MG PO TABS: 4.0000 mg | ORAL_TABLET | Freq: Four times a day (QID) | ORAL | Status: DC | PRN

## 2016-07-30 MED ORDER — CEFUROXIME SODIUM 1.5 G IJ SOLR
INTRAMUSCULAR | Status: AC
  Filled 2016-07-30: qty 1.5

## 2016-07-30 MED ORDER — CEFAZOLIN SODIUM-DEXTROSE 2-4 GM/100ML-% IV SOLN
2.0000 g | Freq: Four times a day (QID) | INTRAVENOUS | Status: AC
  Administered 2016-07-30 (×2): 2 g via INTRAVENOUS
  Filled 2016-07-30 (×2): qty 100

## 2016-07-30 MED ORDER — DEXAMETHASONE SODIUM PHOSPHATE 10 MG/ML IJ SOLN
INTRAMUSCULAR | Status: DC | PRN
  Administered 2016-07-30: 10 mg via INTRAVENOUS

## 2016-07-30 MED ORDER — 0.9 % SODIUM CHLORIDE (POUR BTL) OPTIME
TOPICAL | Status: DC | PRN
  Administered 2016-07-30: 1000 mL

## 2016-07-30 MED ORDER — CHLORHEXIDINE GLUCONATE 4 % EX LIQD: 60.0000 mL | Freq: Once | CUTANEOUS | Status: DC

## 2016-07-30 MED ORDER — METOCLOPRAMIDE HCL 5 MG/ML IJ SOLN: 10.0000 mg | Freq: Once | INTRAMUSCULAR | Status: DC | PRN

## 2016-07-30 MED ORDER — PROPOFOL 500 MG/50ML IV EMUL
INTRAVENOUS | Status: DC | PRN
  Administered 2016-07-30: 50 ug/kg/min via INTRAVENOUS

## 2016-07-30 MED ORDER — ALUM & MAG HYDROXIDE-SIMETH 200-200-20 MG/5ML PO SUSP: 30.0000 mL | ORAL | Status: DC | PRN

## 2016-07-30 MED ORDER — POVIDONE-IODINE 7.5 % EX SOLN
Freq: Once | CUTANEOUS | Status: DC
  Filled 2016-07-30: qty 118

## 2016-07-30 MED ORDER — DOCUSATE SODIUM 100 MG PO CAPS
100.0000 mg | ORAL_CAPSULE | Freq: Two times a day (BID) | ORAL | Status: DC
  Administered 2016-07-31 – 2016-08-01 (×2): 100 mg via ORAL
  Filled 2016-07-30 (×3): qty 1

## 2016-07-30 MED ORDER — MELATONIN 3 MG PO TABS
3.0000 mg | ORAL_TABLET | Freq: Every evening | ORAL | Status: DC | PRN
  Filled 2016-07-30: qty 1

## 2016-07-30 MED ORDER — HYDROMORPHONE HCL 1 MG/ML IJ SOLN: 0.2500 mg | INTRAMUSCULAR | Status: DC | PRN

## 2016-07-30 MED ORDER — DEXAMETHASONE SODIUM PHOSPHATE 10 MG/ML IJ SOLN
10.0000 mg | Freq: Three times a day (TID) | INTRAMUSCULAR | Status: AC
  Administered 2016-07-30 – 2016-07-31 (×4): 10 mg via INTRAVENOUS
  Filled 2016-07-30 (×4): qty 1

## 2016-07-30 MED ORDER — OXYCODONE HCL 5 MG PO TABS
5.0000 mg | ORAL_TABLET | ORAL | Status: DC | PRN
  Administered 2016-07-30 – 2016-08-01 (×7): 10 mg via ORAL
  Filled 2016-07-30 (×8): qty 2

## 2016-07-30 MED ORDER — ONDANSETRON HCL 4 MG/2ML IJ SOLN: 4.0000 mg | Freq: Four times a day (QID) | INTRAMUSCULAR | Status: DC | PRN

## 2016-07-30 MED ORDER — MEPERIDINE HCL 25 MG/ML IJ SOLN: 6.2500 mg | INTRAMUSCULAR | Status: DC | PRN

## 2016-07-30 MED ORDER — SODIUM CHLORIDE 0.9 % IR SOLN
Status: DC | PRN
  Administered 2016-07-30: 3000 mL

## 2016-07-30 MED ORDER — MIDAZOLAM HCL 5 MG/5ML IJ SOLN
INTRAMUSCULAR | Status: DC | PRN
  Administered 2016-07-30: 1 mg via INTRAVENOUS

## 2016-07-30 MED ORDER — ACETAMINOPHEN 650 MG RE SUPP
650.0000 mg | Freq: Four times a day (QID) | RECTAL | Status: DC | PRN
Start: 2016-07-30 — End: 2016-08-01

## 2016-07-30 MED ORDER — ONDANSETRON HCL 4 MG/2ML IJ SOLN
INTRAMUSCULAR | Status: AC
  Filled 2016-07-30: qty 2

## 2016-07-30 MED ORDER — DIPHENHYDRAMINE HCL 12.5 MG/5ML PO ELIX: 12.5000 mg | ORAL_SOLUTION | ORAL | Status: DC | PRN

## 2016-07-30 MED ORDER — MIDAZOLAM HCL 2 MG/2ML IJ SOLN
INTRAMUSCULAR | Status: AC
  Filled 2016-07-30: qty 2

## 2016-07-30 MED ORDER — PHENYLEPHRINE HCL 10 MG/ML IJ SOLN
INTRAVENOUS | Status: DC | PRN
  Administered 2016-07-30: 25 ug/min via INTRAVENOUS

## 2016-07-30 MED ORDER — ACETAMINOPHEN 500 MG PO TABS
1000.0000 mg | ORAL_TABLET | Freq: Four times a day (QID) | ORAL | Status: AC
  Administered 2016-07-30 – 2016-07-31 (×4): 1000 mg via ORAL
  Filled 2016-07-30 (×4): qty 2

## 2016-07-30 MED ORDER — POLYETHYLENE GLYCOL 3350 17 G PO PACK
17.0000 g | PACK | Freq: Two times a day (BID) | ORAL | Status: DC
  Administered 2016-07-31 – 2016-08-01 (×2): 17 g via ORAL
  Filled 2016-07-30 (×3): qty 1

## 2016-07-30 MED ORDER — LACTATED RINGERS IV SOLN
INTRAVENOUS | Status: DC | PRN
  Administered 2016-07-30: 07:00:00 via INTRAVENOUS

## 2016-07-30 MED ORDER — OXYBUTYNIN CHLORIDE 5 MG PO TABS
2.5000 mg | ORAL_TABLET | Freq: Three times a day (TID) | ORAL | Status: DC
  Administered 2016-07-30 – 2016-08-01 (×4): 5 mg via ORAL
  Filled 2016-07-30 (×5): qty 1

## 2016-07-30 MED ORDER — PROPOFOL 10 MG/ML IV BOLUS
INTRAVENOUS | Status: AC
  Filled 2016-07-30: qty 20

## 2016-07-30 MED ORDER — FENTANYL CITRATE (PF) 100 MCG/2ML IJ SOLN
INTRAMUSCULAR | Status: AC
  Filled 2016-07-30: qty 2

## 2016-07-30 MED ORDER — ACETAMINOPHEN 325 MG PO TABS
650.0000 mg | ORAL_TABLET | Freq: Four times a day (QID) | ORAL | Status: DC | PRN
  Administered 2016-08-01: 650 mg via ORAL
  Filled 2016-07-30: qty 2

## 2016-07-30 MED ORDER — BUPIVACAINE HCL (PF) 0.25 % IJ SOLN
INTRAMUSCULAR | Status: AC
  Filled 2016-07-30: qty 30

## 2016-07-30 MED ORDER — FLUOXETINE HCL 20 MG PO CAPS
20.0000 mg | ORAL_CAPSULE | Freq: Every day | ORAL | Status: DC
  Administered 2016-07-31 – 2016-08-01 (×2): 20 mg via ORAL
  Filled 2016-07-30 (×3): qty 1

## 2016-07-30 MED ORDER — ROPIVACAINE HCL 7.5 MG/ML IJ SOLN
INTRAMUSCULAR | Status: DC | PRN
  Administered 2016-07-30: 20 mL via PERINEURAL

## 2016-07-30 MED ORDER — CEFUROXIME SODIUM 1.5 G IJ SOLR
INTRAMUSCULAR | Status: DC | PRN
  Administered 2016-07-30: 1.5 g

## 2016-07-30 MED ORDER — HYDROMORPHONE HCL 2 MG/ML IJ SOLN
1.0000 mg | INTRAMUSCULAR | Status: DC | PRN
  Administered 2016-07-30 – 2016-07-31 (×2): 1 mg via INTRAVENOUS
  Filled 2016-07-30 (×2): qty 1

## 2016-07-30 MED ORDER — METOCLOPRAMIDE HCL 5 MG/ML IJ SOLN: 5.0000 mg | Freq: Three times a day (TID) | INTRAMUSCULAR | Status: DC | PRN

## 2016-07-30 SURGICAL SUPPLY — 74 items
BANDAGE ESMARK 6X9 LF (GAUZE/BANDAGES/DRESSINGS) ×1 IMPLANT
BENZOIN TINCTURE PRP APPL 2/3 (GAUZE/BANDAGES/DRESSINGS) ×3 IMPLANT
BLADE SAGITTAL 25.0X1.19X90 (BLADE) ×2 IMPLANT
BLADE SAGITTAL 25.0X1.19X90MM (BLADE) ×1
BLADE SAW SGTL 13X75X1.27 (BLADE) ×3 IMPLANT
BLADE SURG 10 STRL SS (BLADE) ×6 IMPLANT
BNDG ELASTIC 6X15 VLCR STRL LF (GAUZE/BANDAGES/DRESSINGS) ×3 IMPLANT
BNDG ESMARK 6X9 LF (GAUZE/BANDAGES/DRESSINGS) ×3
BOWL SMART MIX CTS (DISPOSABLE) ×3 IMPLANT
CAPT KNEE TOTAL 3 ATTUNE ×3 IMPLANT
CEMENT HV SMART SET (Cement) ×6 IMPLANT
CLOSURE STERI-STRIP 1/2X4 (GAUZE/BANDAGES/DRESSINGS) ×1
CLOSURE WOUND 1/2 X4 (GAUZE/BANDAGES/DRESSINGS) ×1
CLSR STERI-STRIP ANTIMIC 1/2X4 (GAUZE/BANDAGES/DRESSINGS) ×2 IMPLANT
COVER SURGICAL LIGHT HANDLE (MISCELLANEOUS) ×3 IMPLANT
CUFF TOURNIQUET SINGLE 34IN LL (TOURNIQUET CUFF) ×3 IMPLANT
CUFF TOURNIQUET SINGLE 44IN (TOURNIQUET CUFF) IMPLANT
DECANTER SPIKE VIAL GLASS SM (MISCELLANEOUS) ×3 IMPLANT
DRAPE EXTREMITY T 121X128X90 (DRAPE) ×3 IMPLANT
DRAPE HALF SHEET 40X57 (DRAPES) ×3 IMPLANT
DRAPE INCISE IOBAN 66X45 STRL (DRAPES) IMPLANT
DRAPE PROXIMA HALF (DRAPES) ×3 IMPLANT
DRAPE U-SHAPE 47X51 STRL (DRAPES) ×3 IMPLANT
DRSG AQUACEL AG ADV 3.5X14 (GAUZE/BANDAGES/DRESSINGS) ×3 IMPLANT
DURAPREP 26ML APPLICATOR (WOUND CARE) ×6 IMPLANT
ELECT CAUTERY BLADE 6.4 (BLADE) ×3 IMPLANT
ELECT REM PT RETURN 9FT ADLT (ELECTROSURGICAL) ×3
ELECTRODE REM PT RTRN 9FT ADLT (ELECTROSURGICAL) ×1 IMPLANT
FACESHIELD WRAPAROUND (MASK) ×3 IMPLANT
GLOVE BIO SURGEON STRL SZ7 (GLOVE) ×3 IMPLANT
GLOVE BIOGEL PI IND STRL 7.0 (GLOVE) ×1 IMPLANT
GLOVE BIOGEL PI IND STRL 7.5 (GLOVE) ×1 IMPLANT
GLOVE BIOGEL PI INDICATOR 7.0 (GLOVE) ×2
GLOVE BIOGEL PI INDICATOR 7.5 (GLOVE) ×2
GLOVE SS BIOGEL STRL SZ 7.5 (GLOVE) ×1 IMPLANT
GLOVE SUPERSENSE BIOGEL SZ 7.5 (GLOVE) ×2
GOWN STRL REUS W/ TWL LRG LVL3 (GOWN DISPOSABLE) ×1 IMPLANT
GOWN STRL REUS W/ TWL XL LVL3 (GOWN DISPOSABLE) ×2 IMPLANT
GOWN STRL REUS W/TWL LRG LVL3 (GOWN DISPOSABLE) ×2
GOWN STRL REUS W/TWL XL LVL3 (GOWN DISPOSABLE) ×4
HANDPIECE INTERPULSE COAX TIP (DISPOSABLE) ×2
HOOD PEEL AWAY FACE SHEILD DIS (HOOD) ×6 IMPLANT
IMMOBILIZER KNEE 22 (SOFTGOODS) ×3 IMPLANT
IMMOBILIZER KNEE 22 UNIV (SOFTGOODS) ×3 IMPLANT
KIT BASIN OR (CUSTOM PROCEDURE TRAY) ×3 IMPLANT
KIT ROOM TURNOVER OR (KITS) ×3 IMPLANT
MANIFOLD NEPTUNE II (INSTRUMENTS) ×3 IMPLANT
MARKER SKIN DUAL TIP RULER LAB (MISCELLANEOUS) ×3 IMPLANT
NEEDLE 18GX1X1/2 (RX/OR ONLY) (NEEDLE) ×6 IMPLANT
NEEDLE FILTER BLUNT 18X 1/2SAF (NEEDLE) ×2
NEEDLE FILTER BLUNT 18X1 1/2 (NEEDLE) ×1 IMPLANT
NS IRRIG 1000ML POUR BTL (IV SOLUTION) ×3 IMPLANT
PACK TOTAL JOINT (CUSTOM PROCEDURE TRAY) ×3 IMPLANT
PAD ARMBOARD 7.5X6 YLW CONV (MISCELLANEOUS) ×6 IMPLANT
SET HNDPC FAN SPRY TIP SCT (DISPOSABLE) ×1 IMPLANT
STRIP CLOSURE SKIN 1/2X4 (GAUZE/BANDAGES/DRESSINGS) ×2 IMPLANT
SUCTION FRAZIER HANDLE 10FR (MISCELLANEOUS) ×2
SUCTION TUBE FRAZIER 10FR DISP (MISCELLANEOUS) ×1 IMPLANT
SUT MNCRL AB 3-0 PS2 18 (SUTURE) ×3 IMPLANT
SUT VIC AB 0 CT1 27 (SUTURE) ×4
SUT VIC AB 0 CT1 27XBRD ANBCTR (SUTURE) ×2 IMPLANT
SUT VIC AB 1 CT1 27 (SUTURE) ×4
SUT VIC AB 1 CT1 27XBRD ANBCTR (SUTURE) ×2 IMPLANT
SUT VIC AB 2-0 CT1 27 (SUTURE) ×4
SUT VIC AB 2-0 CT1 TAPERPNT 27 (SUTURE) ×2 IMPLANT
SYR 30ML LL (SYRINGE) ×3 IMPLANT
SYR TB 1ML LUER SLIP (SYRINGE) ×3 IMPLANT
TOWEL OR 17X24 6PK STRL BLUE (TOWEL DISPOSABLE) ×3 IMPLANT
TOWEL OR 17X26 10 PK STRL BLUE (TOWEL DISPOSABLE) ×3 IMPLANT
TRAY CATH 16FR W/PLASTIC CATH (SET/KITS/TRAYS/PACK) IMPLANT
TRAY FOLEY CATH 16FR SILVER (SET/KITS/TRAYS/PACK) ×3 IMPLANT
TUBE CONNECTING 12'X1/4 (SUCTIONS) ×1
TUBE CONNECTING 12X1/4 (SUCTIONS) ×2 IMPLANT
YANKAUER SUCT BULB TIP NO VENT (SUCTIONS) ×3 IMPLANT

## 2016-07-30 NOTE — Anesthesia Procedure Notes (Addendum)
Anesthesia Regional Block: Adductor canal block   Pre-Anesthetic Checklist: ,, timeout performed, Correct Patient, Correct Site, Correct Laterality, Correct Procedure, Correct Position, site marked, Risks and benefits discussed,  Surgical consent,  Pre-op evaluation,  At surgeon's request and post-op pain management  Laterality: Right and Lower  Prep: chloraprep       Needles:  Injection technique: Single-shot  Needle Type: Echogenic Stimulator Needle     Needle Length: 9cm    Needle insertion depth: 5 cm   Additional Needles:   Narrative:  Start time: 07/30/2016 7:05 AM End time: 07/30/2016 7:11 AM Injection made incrementally with aspirations every 5 mL.  Performed by: Personally  Anesthesiologist: Josephine Igo  Additional Notes: Timeout performed. Patient sedated. Relevant anatomy ID'd using [place]. Incremental 2-54ml injection with frequent aspiration. Patient tolerated procedure well.

## 2016-07-30 NOTE — Discharge Instructions (Signed)
Information on my medicine - ELIQUIS® (apixaban) ° °This medication education was reviewed with me or my healthcare representative as part of my discharge preparation.  The pharmacist that spoke with me during my hospital stay was:  Aeron Donaghey Dien, RPH ° °Why was Eliquis® prescribed for you? °Eliquis® was prescribed for you to reduce the risk of blood clots forming after orthopedic surgery.   ° °What do You need to know about Eliquis®? °Take your Eliquis® TWICE DAILY - one tablet in the morning and one tablet in the evening with or without food.  It would be best to take the dose about the same time each day. ° °If you have difficulty swallowing the tablet whole please discuss with your pharmacist how to take the medication safely. ° °Take Eliquis® exactly as prescribed by your doctor and DO NOT stop taking Eliquis® without talking to the doctor who prescribed the medication.  Stopping without other medication to take the place of Eliquis® may increase your risk of developing a clot. ° °After discharge, you should have regular check-up appointments with your healthcare provider that is prescribing your Eliquis®. ° °What do you do if you miss a dose? °If a dose of ELIQUIS® is not taken at the scheduled time, take it as soon as possible on the same day and twice-daily administration should be resumed.  The dose should not be doubled to make up for a missed dose.  Do not take more than one tablet of ELIQUIS at the same time. ° °Important Safety Information °A possible side effect of Eliquis® is bleeding. You should call your healthcare provider right away if you experience any of the following: °? Bleeding from an injury or your nose that does not stop. °? Unusual colored urine (red or dark brown) or unusual colored stools (red or black). °? Unusual bruising for unknown reasons. °? A serious fall or if you hit your head (even if there is no bleeding). ° °Some medicines may interact with Eliquis® and might increase  your risk of bleeding or clotting while on Eliquis®. To help avoid this, consult your healthcare provider or pharmacist prior to using any new prescription or non-prescription medications, including herbals, vitamins, non-steroidal anti-inflammatory drugs (NSAIDs) and supplements. ° °This website has more information on Eliquis® (apixaban): http://www.eliquis.com/eliquis/home ° °

## 2016-07-30 NOTE — Evaluation (Signed)
Physical Therapy Evaluation Patient Details Name: Zoe Kim MRN: [id number] DOB: 1946-03-19 Today's Date: 07/30/2016   History of Present Illness  Pt with R knee OA, now s/p R TKA 07/30/16.  Clinical Impression  Pt pleasant and motivated to work with PT today. Presents with decreased range of motion, decreased strength, and impaired balance secondary to above. PTA, pt mod indep with amb using single crutch; lives at home with husband. Today, pt able to amb in hallway with RW. Educ on importance of mobility, knee prec, therex, use of zero degree knee, and amb with RW. Pt would benefit from continued acute PT services to maximize functional mobility and independence.     Follow Up Recommendations Home health PT    Equipment Recommendations  Rolling walker with 5" wheels    Recommendations for Other Services       Precautions / Restrictions Precautions Precautions: Knee Precaution Booklet Issued: No Restrictions Weight Bearing Restrictions: Yes RLE Weight Bearing: Weight bearing as tolerated      Mobility  Bed Mobility Overal bed mobility: Needs Assistance Bed Mobility: Supine to Sit     Supine to sit: HOB elevated;Min guard        Transfers Overall transfer level: Needs assistance Equipment used: Rolling walker (2 wheeled) Transfers: Sit to/from Stand Sit to Stand: Min guard            Ambulation/Gait Ambulation/Gait assistance: Min guard Ambulation Distance (Feet): 100 Feet Assistive device: Rolling walker (2 wheeled) Gait Pattern/deviations: Step-to pattern;Antalgic;Decreased stance time - right Gait velocity: Decreased Gait velocity interpretation: Below normal speed for age/gender General Gait Details: Slow, guarded gait with RW and min guard for safety; lacking full ext of R knee in WB. R knee instability with amb.   Stairs            Wheelchair Mobility    Modified Rankin (Stroke Patients Only)       Balance Overall balance  assessment: Needs assistance Sitting-balance support: No upper extremity supported;Feet supported Sitting balance-Leahy Scale: Fair     Standing balance support: Bilateral upper extremity supported;During functional activity Standing balance-Leahy Scale: Poor                               Pertinent Vitals/Pain Pain Assessment: 0-10 Pain Score: 3  Pain Location: R knee Pain Descriptors / Indicators: Discomfort;Aching Pain Intervention(s): Premedicated before session;Repositioned;Limited activity within patient's tolerance    Home Living Family/patient expects to be discharged to:: Private residence Living Arrangements: Spouse/significant other Available Help at Discharge: Personal care attendant;Family;Available PRN/intermittently Type of Home: House Home Access: Level entry   Entrance Stairs-Number of Steps: 2 Home Layout: One level Home Equipment: Wheelchair - manual;Crutches      Prior Function Level of Independence: Independent with assistive device(s)         Comments: Amb with single crutch secondary to R knee pain. Pt works as Social worker        Extremity/Trunk Assessment   Upper Extremity Assessment Upper Extremity Assessment: Overall WFL for tasks assessed    Lower Extremity Assessment Lower Extremity Assessment: Generalized weakness (s/p surgery)    Cervical / Trunk Assessment Cervical / Trunk Assessment: Normal  Communication   Communication: No difficulties  Cognition Arousal/Alertness: Awake/alert Behavior During Therapy: WFL for tasks assessed/performed Overall Cognitive Status: Within Functional Limits for tasks assessed  General Comments      Exercises     Assessment/Plan    PT Assessment Patient needs continued PT services  PT Problem List Decreased strength;Decreased range of motion;Decreased balance;Decreased mobility;Pain       PT Treatment Interventions DME  instruction;Gait training;Stair training;Functional mobility training;Therapeutic activities;Therapeutic exercise;Patient/family education;Balance training    PT Goals (Current goals can be found in the Care Plan section)  Acute Rehab PT Goals Patient Stated Goal: Return home PT Goal Formulation: With patient Time For Goal Achievement: 08/13/16 Potential to Achieve Goals: Good    Frequency 7X/week   Barriers to discharge        Co-evaluation               End of Session Equipment Utilized During Treatment: Gait belt Activity Tolerance: Patient tolerated treatment well Patient left: in chair;with call bell/phone within reach Nurse Communication: Mobility status PT Visit Diagnosis: Muscle weakness (generalized) (M62.81);Unsteadiness on feet (R26.81)         Time: [id number] PT Time Calculation (min) (ACUTE ONLY): 27 min   Charges:   PT Evaluation $PT Eval Low Complexity: 1 Procedure PT Treatments $Gait Training: 8-22 mins   PT G Codes:       Enis Gash, SPT Office-[phone]  Mabeline Caras 07/30/2016, 4:02 PM

## 2016-07-30 NOTE — Progress Notes (Signed)
Orthopedic Tech Progress Note Patient DetailsBERA Kim 1945/07/08 [id number]  CPM Right Knee CPM Right Knee: On Right Knee Flexion (Degrees): 90 Right Knee Extension (Degrees): 0 Additional Comments: trapeze bar patient helper   Hildred Priest 07/30/2016, 10:32 AM Viewed order from doctor's order list

## 2016-07-30 NOTE — Anesthesia Procedure Notes (Signed)
Procedure Name: MAC Date/Time: 07/30/2016 7:22 AM Performed by: Tressia Miners LEFFEW Pre-anesthesia Checklist: Patient identified, Emergency Drugs available, Suction available, Timeout performed and Patient being monitored Patient Re-evaluated:Patient Re-evaluated prior to inductionOxygen Delivery Method: Simple face mask Placement Confirmation: positive ETCO2

## 2016-07-30 NOTE — Transfer of Care (Signed)
Immediate Anesthesia Transfer of Care Note  Patient: Zoe Kim  Procedure(s) Performed: Procedure(s): TOTAL KNEE ARTHROPLASTY (Right)  Patient Location: PACU  Anesthesia Type:Spinal  Level of Consciousness: awake, alert , oriented, patient cooperative and responds to stimulation  Airway & Oxygen Therapy: Patient Spontanous Breathing and Patient connected to face mask oxygen  Post-op Assessment: Report given to RN and Post -op Vital signs reviewed and stable  Post vital signs: Reviewed and stable  Last Vitals:  Vitals:   07/30/16 0713 07/30/16 0941  BP:    Pulse: 68   Resp: 15   Temp:  (P) 36.3 C    Last Pain:  Vitals:   07/30/16 0643  TempSrc: Oral         Complications: No apparent anesthesia complications

## 2016-07-30 NOTE — Op Note (Signed)
MRN:     [id number] DOB/AGE:    1945/12/25 / 71 y.o.       OPERATIVE REPORT    DATE OF PROCEDURE:  07/30/2016       PREOPERATIVE DIAGNOSIS:   Primary localized OA right knee      Estimated body mass index is 31.62 kg/m as calculated from the following:   Height as of this encounter: 5\' 5"  (1.651 m).   Weight as of this encounter: 190 lb (86.2 kg).                                                        POSTOPERATIVE DIAGNOSIS:   primary localized osteoarthritis of the right knee                                                                      PROCEDURE:  Procedure(s): TOTAL KNEE ARTHROPLASTY Using Depuy Attune RP implants #5 Femur, #4Tibia, 87mm  RP bearing, 32 Patella     SURGEON: Govani Radloff A    ASSISTANT:  Kirstin Shepperson PA-C   (Present and scrubbed throughout the case, critical for assistance with exposure, retraction, instrumentation, and closure.)         ANESTHESIA: Spinal with Adductor Nerve Block     TOURNIQUET TIME: [id number]   COMPLICATIONS:  None     SPECIMENS: None   INDICATIONS FOR PROCEDURE: The patient has  djd right knee, varus deformities, XR shows bone on bone arthritis. Patient has failed all conservative measures including anti-inflammatory medicines, narcotics, attempts at  exercise and weight loss, cortisone injections and viscosupplementation.  Risks and benefits of surgery have been discussed, questions answered.   DESCRIPTION OF PROCEDURE: The patient identified by armband, received  right femoral nerve block and IV antibiotics, in the holding area at [facility]. Patient taken to the operating room, appropriate anesthetic  monitors were attached Spinal anesthesia induced with  the patient in supine position, Foley catheter was inserted. Tourniquet  applied high to the operative thigh. Lateral post and foot positioner  applied to the table, the lower extremity was then prepped and draped  in usual sterile fashion from the ankle to the  tourniquet. Time-out procedure was performed. The limb was wrapped with an Esmarch bandage and the tourniquet inflated to 365 mmHg. We began the operation by making the anterior midline incision starting at handbreadth above the patella going over the patella 1 cm medial to and  4 cm distal to the tibial tubercle. Small bleeders in the skin and the  subcutaneous tissue identified and cauterized. Transverse retinaculum was incised and reflected medially and a medial parapatellar arthrotomy was accomplished. the patella was everted and theprepatellar fat pad resected. The superficial medial collateral  ligament was then elevated from anterior to posterior along the proximal  flare of the tibia and anterior half of the menisci resected. The knee was hyperflexed exposing bone on bone arthritis. Peripheral and notch osteophytes as well as the cruciate ligaments were then resected. We continued to  work our way around posteriorly along the proximal tibia, and externally  rotated the tibia subluxing it out from underneath the femur. A McHale  retractor was placed through the notch and a lateral Hohmann retractor  placed, and we then drilled through the proximal tibia in line with the  axis of the tibia followed by an intramedullary guide rod and 2-degree  posterior slope cutting guide. The tibial cutting guide was pinned into place  allowing resection of 4 mm of bone medially and about 6 mm of bone  laterally because of her varus deformity. Satisfied with the tibial resection, we then  entered the distal femur 2 mm anterior to the PCL origin with the  intramedullary guide rod and applied the distal femoral cutting guide  set at 11mm, with 5 degrees of valgus. This was pinned along the  epicondylar axis. At this point, the distal femoral cut was accomplished without difficulty. We then sized for a #5 femoral component and pinned the guide in 3 degrees of external rotation.The chamfer cutting guide was pinned  into place. The anterior, posterior, and chamfer cuts were accomplished without difficulty followed by  the  RP box cutting guide and the box cut. We also removed posterior osteophytes from the posterior femoral condyles. At this  time, the knee was brought into full extension. We checked our  extension and flexion gaps and found them symmetric at 41mm.  The patella thickness measured at 25 mm. We set the cutting guide at 15 and removed the posterior 9.5-10 mm  of the patella sized for 32 button and drilled the lollipop. The knee  was then once again hyperflexed exposing the proximal tibia. We sized for a #4 tibial base plate, applied the smokestack and the conical reamer followed by the the Delta fin keel punch. We then hammered into place the  RP trial femoral component, inserted a 1 trial bearing, trial patellar button, and took the knee through range of motion from 0-130 degrees. No thumb pressure was required for patellar  tracking. At this point, all trial components were removed, a double batch of DePuy HV cement with 1500 mg of Zinacef was mixed and applied to all bony metallic mating surfaces except for the posterior condyles of the femur itself. In order, we  hammered into place the tibial tray and removed excess cement, the femoral component and removed excess cement, a 34mm  RP bearing  was inserted, and the knee brought to full extension with compression.  The patellar button was clamped into place, and excess cement  removed. While the cement cured the wound was irrigated out with normal saline solution pulse lavage.. Ligament stability and patellar tracking were checked and found to be excellent.. The parapatellar arthrotomy was closed with  #1 Vicryl suture. The subcutaneous tissue with 0 and 2-0 undyed  Vicryl suture, and 4-0 Monocryl.. A dressing of Aquaseal,  4 x 4, dressing sponges, Webril, and Ace wrap applied. Needle and sponge count were correct times 2.The patient awakened,  extubated, and taken to recovery room without difficulty. Vascular status was normal, pulses 2+ and symmetric.   Zoe Kim A 07/30/2016, 9:13 AM

## 2016-07-30 NOTE — Anesthesia Procedure Notes (Signed)
Spinal  Patient location during procedure: OR Start time: 07/30/2016 7:25 AM Staffing Anesthesiologist: Josephine Igo Performed: anesthesiologist  Preanesthetic Checklist Completed: patient identified, site marked, surgical consent, pre-op evaluation, timeout performed, IV checked, risks and benefits discussed and monitors and equipment checked Spinal Block Patient position: sitting Prep: site prepped and draped and DuraPrep Patient monitoring: heart rate, cardiac monitor, continuous pulse ox and blood pressure Approach: midline Location: L4-5 Injection technique: single-shot Needle Needle type: Pencan  Needle gauge: 24 G Needle length: 9 cm Needle insertion depth: 6 cm Assessment Sensory level: T4 Additional Notes Patient tolerated procedure well. Adequate sensory level.

## 2016-07-30 NOTE — Anesthesia Postprocedure Evaluation (Signed)
Anesthesia Post Note  Patient: Zoe Kim  Procedure(s) Performed: Procedure(s) (LRB): TOTAL KNEE ARTHROPLASTY (Right)  Patient location during evaluation: PACU Anesthesia Type: Spinal Level of consciousness: oriented and awake and alert Pain management: pain level controlled Vital Signs Assessment: post-procedure vital signs reviewed and stable Respiratory status: spontaneous breathing, respiratory function stable and nonlabored ventilation Cardiovascular status: blood pressure returned to baseline and stable Postop Assessment: no headache, no backache, spinal receding, patient able to bend at knees and no signs of nausea or vomiting Anesthetic complications: no       Last Vitals:  Vitals:   07/30/16 1000 07/30/16 1027  BP:  (!) 128/58  Pulse: 73 69  Resp: 19 19  Temp:      Last Pain:  Vitals:   07/30/16 0643  TempSrc: Oral            L Sensory Level: L5-Outer lower leg, top of foot, great toe (07/30/16 1040) R Sensory Level: L5-Outer lower leg, top of foot, great toe (07/30/16 1040)  Standley Bargo A.

## 2016-07-31 ENCOUNTER — Encounter (HOSPITAL_COMMUNITY): Payer: Self-pay | Admitting: Orthopedic Surgery

## 2016-07-31 LAB — BASIC METABOLIC PANEL
Anion gap: 7 (ref 5–15)
BUN: 9 mg/dL (ref 6–20)
CO2: 23 mmol/L (ref 22–32)
Calcium: 8.8 mg/dL — ABNORMAL LOW (ref 8.9–10.3)
Chloride: 106 mmol/L (ref 101–111)
Creatinine, Ser: 0.77 mg/dL (ref 0.44–1.00)
GFR calc Af Amer: >60 mL/min (ref >60)
GLUCOSE: 151 mg/dL — AB (ref 65–99)
POTASSIUM: 4 mmol/L (ref 3.5–5.1)
Sodium: 136 mmol/L (ref 135–145)

## 2016-07-31 LAB — CBC
HEMATOCRIT: 32.7 % — AB (ref 36.0–46.0)
Hemoglobin: 11 g/dL — ABNORMAL LOW (ref 12.0–15.0)
MCH: 29.3 pg (ref 26.0–34.0)
MCHC: 33.6 g/dL (ref 30.0–36.0)
MCV: 87 fL (ref 78.0–100.0)
PLATELETS: 219 10*3/uL (ref 150–400)
RBC: 3.76 MIL/uL — AB (ref 3.87–5.11)
RDW: 13.1 % (ref 11.5–15.5)
WBC: 13.8 10*3/uL — ABNORMAL HIGH (ref 4.0–10.5)

## 2016-07-31 NOTE — Evaluation (Signed)
Occupational Therapy Evaluation Patient Details Name: Zoe Kim MRN: [id number] DOB: August 20, 1945 Today's Date: 07/31/2016    History of Present Illness Pt with R knee OA, now s/p R TKA 07/30/16.   Clinical Impression   Pt with decline in function and safety with ADLs and ADL mobility with decreased activity tolerance and balance. Pt will have assist at home form her spouse. Educated pt on DME and A/E for home use, shower transfer techniques. Pt would benefit from acute OT services to address impairments to increase level of function and safety    Follow Up Recommendations  No OT follow up;Supervision - Intermittent    Equipment Recommendations  3 in 1 bedside commode;Other (comment);Tub/shower seat Management consultant)    Recommendations for Other Services       Precautions / Restrictions Precautions Precautions: Knee Precaution Booklet Issued: No Restrictions Weight Bearing Restrictions: Yes RLE Weight Bearing: Weight bearing as tolerated      Mobility Bed Mobility               General bed mobility comments: pt up in recliner upon arrival, per PT note pt is min guard A  Transfers Overall transfer level: Needs assistance Equipment used: Rolling walker (2 wheeled) Transfers: Sit to/from Stand Sit to Stand: Min guard         General transfer comment: cues for correct hand placement    Balance Overall balance assessment: Needs assistance Sitting-balance support: No upper extremity supported;Feet supported Sitting balance-Leahy Scale: Good     Standing balance support: Bilateral upper extremity supported;During functional activity;Single extremity supported Standing balance-Leahy Scale: Fair                              ADL Overall ADL's : Needs assistance/impaired     Grooming: Wash/dry hands;Standing;Min guard   Upper Body Bathing: Set up;Sitting   Lower Body Bathing: Moderate assistance;Sitting/lateral leans;Sit to/from stand   Upper  Body Dressing : Set up;Sitting   Lower Body Dressing: Moderate assistance;Sit to/from stand;Sitting/lateral leans   Toilet Transfer: Min guard;Ambulation;RW;Comfort height toilet;Cueing for safety   Toileting- Clothing Manipulation and Hygiene: Minimal assistance;Sit to/from stand   Tub/ Banker: 3 in 1;Ambulation;Rolling walker;Min guard   Functional mobility during ADLs: Min guard;Rolling walker;Cueing for safety General ADL Comments: Educated pt on ADL A/E for home use     Vision   Vision Assessment?: No apparent visual deficits                Pertinent Vitals/Pain Pain Assessment: 0-10 Pain Score: 6  Pain Location: R knee Pain Descriptors / Indicators: Aching;Sore Pain Intervention(s): Monitored during session;Premedicated before session;Repositioned     Hand Dominance Right   Extremity/Trunk Assessment Upper Extremity Assessment Upper Extremity Assessment: Overall WFL for tasks assessed   Lower Extremity Assessment Lower Extremity Assessment: Defer to PT evaluation   Cervical / Trunk Assessment Cervical / Trunk Assessment: Normal   Communication Communication Communication: No difficulties   Cognition Arousal/Alertness: Awake/alert Behavior During Therapy: WFL for tasks assessed/performed Overall Cognitive Status: Within Functional Limits for tasks assessed                     General Comments   pt pleasant and cooperative                 Home Living Family/patient expects to be discharged to:: Private residence Living Arrangements: Spouse/significant other Available Help at Discharge: Personal care attendant;Family;Available PRN/intermittently Type of  Home: House Home Access: Level entry Entrance Stairs-Number of Steps: 2   Home Layout: One level         Biochemist, clinical: [place]: Wheelchair - manual;Crutches          Prior Functioning/Environment Level of Independence: Independent with assistive  device(s)        Comments: Amb with single crutch secondary to R knee pain. Pt works as Customer service manager Problem List: Decreased activity tolerance;Decreased knowledge of use of DME or AE;Pain;Impaired balance (sitting and/or standing)      OT Treatment/Interventions: Self-care/ADL training;DME and/or AE instruction;Therapeutic activities;Patient/family education    OT Goals(Current goals can be found in the care plan section) Acute Rehab OT Goals Patient Stated Goal: Return home OT Goal Formulation: With patient Time For Goal Achievement: 08/07/16 Potential to Achieve Goals: Good ADL Goals Pt Will Perform Grooming: with supervision;with set-up;standing Pt Will Perform Lower Body Bathing: with min assist;with caregiver independent in assisting;sitting/lateral leans;sit to/from stand Pt Will Perform Lower Body Dressing: with min assist;with caregiver independent in assisting;sitting/lateral leans;sit to/from stand Pt Will Transfer to Toilet: with supervision;with modified independence;ambulating Pt Will Perform Toileting - Clothing Manipulation and hygiene: with min guard assist;with supervision;sit to/from stand;sitting/lateral leans Pt Will Perform Tub/Shower Transfer: with supervision;with modified independence;ambulating;shower seat;3 in 1;with caregiver independent in assisting;rolling walker  OT Frequency: Min 2X/week   Barriers to D/C:    no barreirs                     End of Session Equipment Utilized During Treatment: Rolling walker;Other (comment) (3 in 1) CPM Right Knee CPM Right Knee: Off  Activity Tolerance: Patient tolerated treatment well Patient left: in chair;with call bell/phone within reach  OT Visit Diagnosis: Other abnormalities of gait and mobility (R26.89);Pain                ADL either performed or assessed with clinical judgement  Time: [id number] OT Time Calculation (min): 25 min Charges:  OT General Charges $OT Visit: 1  Procedure OT Evaluation $OT Eval Low Complexity: 1 Procedure OT Treatments $Therapeutic Activity: 8-22 mins G-Codes:       Britt Bottom 07/31/2016, 12:18 PM

## 2016-07-31 NOTE — Progress Notes (Addendum)
Physical Therapy Treatment Patient Details Name: Zoe Kim MRN: [id number] DOB: [birth year] Today's Date: 07/31/2016    History of Present Illness Pt with R knee OA, now s/p R TKA 07/30/16.    PT Comments    Pt reports increased pain this am but reports it improves with mobility.  Patient performed gait with progression to step through pattern.  PTA also introduced HEP.  Will f/u this pm for stair training.     Follow Up Recommendations  Home health PT     Equipment Recommendations  Rolling walker with 5" wheels    Recommendations for Other Services       Precautions / Restrictions Precautions Precautions: Knee Precaution Booklet Issued: No Restrictions Weight Bearing Restrictions: Yes RLE Weight Bearing: Weight bearing as tolerated    Mobility  Bed Mobility               General bed mobility comments: Pt in recliner on arrival in bone foam.   Transfers Overall transfer level: Needs assistance Equipment used: Rolling walker (2 wheeled) Transfers: Sit to/from Stand Sit to Stand: Supervision         General transfer comment: Cues for hand placement and RLE foot placement to improve ease and decrease pain.    Ambulation/Gait Ambulation/Gait assistance: Min assist Ambulation Distance (Feet): 300 Feet Assistive device: Rolling walker (2 wheeled) Gait Pattern/deviations: Step-to pattern;Step-through pattern;Decreased stance time - right;Antalgic Gait velocity: Decreased Gait velocity interpretation: Below normal speed for age/gender General Gait Details: Cues for progression to step through sequencing.  Cues for upper trunk control, increasing weight bearing on R and for gait symmetry.     Stairs            Wheelchair Mobility    Modified Rankin (Stroke Patients Only)       Balance Overall balance assessment: Needs assistance Sitting-balance support: No upper extremity supported;Feet supported Sitting balance-Leahy Scale: Good      Standing balance support: Bilateral upper extremity supported;During functional activity;Single extremity supported Standing balance-Leahy Scale: Fair                      Cognition Arousal/Alertness: Awake/alert Behavior During Therapy: WFL for tasks assessed/performed Overall Cognitive Status: Within Functional Limits for tasks assessed                      Exercises Total Joint Exercises Ankle Circles/Pumps: AROM;Both;10 reps;Supine Quad Sets: AROM;Right;10 reps Heel Slides: AROM;Right;10 reps;Supine Hip ABduction/ADduction: AROM;Right;10 reps;Supine Straight Leg Raises: AROM;Right;10 reps;Supine    General Comments        Pertinent Vitals/Pain Pain Assessment: 0-10 Pain Score: 6  Pain Location: R knee Pain Descriptors / Indicators: Aching;Sore Pain Intervention(s): Monitored during session;Premedicated before session;Repositioned    Home Living Family/patient expects to be discharged to:: Private residence Living Arrangements: Spouse/significant other Available Help at Discharge: Personal care attendant;Family;Available PRN/intermittently Type of Home: House Home Access: Level entry   Home Layout: One level Home Equipment: Wheelchair - manual;Crutches      Prior Function Level of Independence: Independent with assistive device(s)      Comments: Amb with single crutch secondary to R knee pain. Pt works as Therapist, sports   PT Goals (current goals can now be found in the care plan section) Acute Rehab PT Goals Patient Stated Goal: Return home Potential to Achieve Goals: Good Progress towards PT goals: Progressing toward goals    Frequency    7X/week      PT Plan Current  plan remains appropriate    Co-evaluation             End of Session Equipment Utilized During Treatment: Gait belt Activity Tolerance: Patient tolerated treatment well Patient left: in chair;with call bell/phone within reach Nurse Communication: Mobility  status PT Visit Diagnosis: Muscle weakness (generalized) (M62.81);Unsteadiness on feet (R26.81)     Time: [id number] PT Time Calculation (min) (ACUTE ONLY): 17 min  Charges:  $Gait Training: 8-22 mins                    G Codes:       Cristela Blue 08-08-16, 1:15 PM Governor Rooks, PTA pager [phone]

## 2016-07-31 NOTE — Care Management Note (Signed)
Case Management Note  Patient Details  Name: Zoe Kim MRN: [id number] Date of Birth: 1946-04-01  Subjective/Objective:  71 yr old female s/p right total knee arthroplasty.                  Action/Plan: Case manager spoke with patient concerning [place] and DME needs. Patient was preoperatively setup with [facility], no changes. RW and 3in1 have been delivered to patient's room, CPM has been deklivered to her home. Patient will have family support at discharge.    Expected Discharge Date:   08/01/16               Expected Discharge Plan:  [place]  In-House Referral:  NA  Discharge planning Services  CM Consult  Post Acute Care Choice:  Durable Medical Equipment, Home Health Choice offered to:  Patient  DME Arranged:  3-N-1, Walker rolling, CPM DME Agency:  [employer]  HH Arranged:  PT [place]:  [facility] [employer]  Status of Service:  Completed, signed off  If discussed at [employer] [facility] [employer], dates discussed:    Additional Comments:  Ninfa Meeker, RN 07/31/2016, 2:46 PM

## 2016-07-31 NOTE — Progress Notes (Signed)
Physical Therapy Treatment Patient Details Name: Zoe Kim MRN: [id number] DOB: May 09, 1946 Today's Date: 07/31/2016    History of Present Illness Pt with R knee OA, now s/p R TKA 07/30/16.    PT Comments    Pt performed increased mobility and reviewed sequencing to progress to step through gait training.  Pt required cues and teaching of technique for stair negotiation.  Plan next session to review stair training and HEP for d/c home.    Follow Up Recommendations  Home health PT     Equipment Recommendations  Rolling walker with 5" wheels    Recommendations for Other Services       Precautions / Restrictions Precautions Precautions: Knee Precaution Booklet Issued: No Restrictions Weight Bearing Restrictions: Yes RLE Weight Bearing: Weight bearing as tolerated    Mobility  Bed Mobility Overal bed mobility: Needs Assistance Bed Mobility: Supine to Sit;Sit to Supine     Supine to sit: HOB elevated;Min guard;Supervision Sit to supine: Supervision   General bed mobility comments: Cues for technique no assistance needed.    Transfers Overall transfer level: Needs assistance Equipment used: Rolling walker (2 wheeled) Transfers: Sit to/from Stand Sit to Stand: Supervision         General transfer comment: Cues for hand placement and RLE foot placement to improve ease and decrease pain.    Ambulation/Gait Ambulation/Gait assistance: Supervision Ambulation Distance (Feet): 300 Feet Assistive device: Rolling walker (2 wheeled) Gait Pattern/deviations: Step-to pattern;Step-through pattern;Decreased stance time - right;Antalgic Gait velocity: Decreased   General Gait Details: Cues for progression to step through sequencing.  Cues for upper trunk control, increasing weight bearing on R and for gait symmetry.  Pt remains to require cues to focus on symmetry as she begins to fatigue   Stairs Stairs: Yes   Stair Management: One rail  Left;Forwards;Backwards Number of Stairs: 5 General stair comments: Forwards to ascend and backwards to descend.  Cues for hand placement on railing and sequencing.  supervision- minguard  Wheelchair Mobility    Modified Rankin (Stroke Patients Only)       Balance Overall balance assessment: Needs assistance   Sitting balance-Leahy Scale: Good       Standing balance-Leahy Scale: Fair                      Cognition Arousal/Alertness: Awake/alert Behavior During Therapy: WFL for tasks assessed/performed Overall Cognitive Status: Within Functional Limits for tasks assessed                      Exercises Total Joint Exercises Goniometric ROM: 70 degrees flexion in R knee.      General Comments        Pertinent Vitals/Pain Pain Assessment: 0-10 Pain Score: 3  Pain Location: R knee Pain Descriptors / Indicators: Aching;Sore Pain Intervention(s): Monitored during session;Repositioned    Home Living                      Prior Function            PT Goals (current goals can now be found in the care plan section) Acute Rehab PT Goals Patient Stated Goal: Return home Potential to Achieve Goals: Good Progress towards PT goals: Progressing toward goals    Frequency    7X/week      PT Plan Current plan remains appropriate    Co-evaluation             End of Session  Equipment Utilized During Treatment: Gait belt Activity Tolerance: Patient tolerated treatment well Patient left: in chair;with call bell/phone within reach Nurse Communication: Mobility status PT Visit Diagnosis: Muscle weakness (generalized) (M62.81);Unsteadiness on feet (R26.81)     Time: [id number] PT Time Calculation (min) (ACUTE ONLY): 15 min  Charges:  $Gait Training: 8-22 mins                    G Codes:       Cristela Blue 08/20/2016, 5:25 PM Governor Rooks, PTA pager [phone]

## 2016-08-01 LAB — BASIC METABOLIC PANEL
Anion gap: 7 (ref 5–15)
BUN: 11 mg/dL (ref 6–20)
CALCIUM: 9.3 mg/dL (ref 8.9–10.3)
CO2: 28 mmol/L (ref 22–32)
Chloride: 102 mmol/L (ref 101–111)
Creatinine, Ser: 0.76 mg/dL (ref 0.44–1.00)
GFR calc Af Amer: >60 mL/min (ref >60)
Glucose, Bld: 136 mg/dL — ABNORMAL HIGH (ref 65–99)
Potassium: 4.7 mmol/L (ref 3.5–5.1)
Sodium: 137 mmol/L (ref 135–145)

## 2016-08-01 LAB — CBC
HCT: 31.8 % — ABNORMAL LOW (ref 36.0–46.0)
Hemoglobin: 10.6 g/dL — ABNORMAL LOW (ref 12.0–15.0)
MCH: 29 pg (ref 26.0–34.0)
MCHC: 33.3 g/dL (ref 30.0–36.0)
MCV: 87.1 fL (ref 78.0–100.0)
PLATELETS: 233 10*3/uL (ref 150–400)
RBC: 3.65 MIL/uL — ABNORMAL LOW (ref 3.87–5.11)
RDW: 13.4 % (ref 11.5–15.5)
WBC: 15.5 10*3/uL — AB (ref 4.0–10.5)

## 2016-08-01 MED ORDER — POLYETHYLENE GLYCOL 3350 17 G PO PACK: PACK | ORAL | 0 refills | Status: DC

## 2016-08-01 MED ORDER — ASPIRIN EC 325 MG PO TBEC: DELAYED_RELEASE_TABLET | ORAL | 0 refills | Status: DC

## 2016-08-01 MED ORDER — OXYCODONE HCL 5 MG PO TABS: ORAL_TABLET | ORAL | 0 refills | Status: DC

## 2016-08-01 MED ORDER — DOCUSATE SODIUM 100 MG PO CAPS: ORAL_CAPSULE | ORAL | 0 refills | Status: DC

## 2016-08-01 NOTE — Progress Notes (Signed)
Occupational Therapy Treatment Patient Details Name: Zoe Kim MRN: [id number] DOB: 01-Apr-1946 Today's Date: 08/01/2016    History of present illness Pt with R knee OA, now s/p R TKA 07/30/16.   OT comments  All education is complete and patient indicates understanding.Pt able to complete shower transfer and reach bil LE for LB dressing at this time. Pt with pending d/c today and adequate (A) upon d/c from spouse.   Follow Up Recommendations  No OT follow up;Supervision - Intermittent    Equipment Recommendations  3 in 1 bedside commode;Other (comment);Tub/shower seat    Recommendations for Other Services      Precautions / Restrictions Precautions Precautions: Knee Precaution Booklet Issued: No Restrictions Weight Bearing Restrictions: Yes RLE Weight Bearing: Weight bearing as tolerated       Mobility Bed Mobility               General bed mobility comments: Pt received in recliner on arrival.    Transfers Overall transfer level: Modified independent Equipment used: Rolling walker (2 wheeled) Transfers: Sit to/from Stand Sit to Stand: Supervision         General transfer comment: Remains to required cues for hand placement to avoid pulling on RW.      Balance Overall balance assessment: Needs assistance Sitting-balance support: No upper extremity supported;Feet supported Sitting balance-Leahy Scale: Good       Standing balance-Leahy Scale: Fair                     ADL Overall ADL's : Needs assistance/impaired                         Toilet Transfer: Modified Independent       Tub/ Shower Transfer: Research officer, political party   Behavior During Therapy: WFL for tasks assessed/performed Overall Cognitive Status: Within Functional Limits for tasks assessed                         Exercises Total Joint  Exercises Ankle Circles/Pumps: AROM;Both;10 reps;Supine Quad Sets: AROM;Right;10 reps;Supine Short Arc Quad: AROM;Right;10 reps;Supine Heel Slides: AROM;Right;10 reps;Supine Hip ABduction/ADduction: AROM;Right;10 reps;Supine Straight Leg Raises: AROM;Right;10 reps;Supine Long Arc Quad: AROM;Right;10 reps;Supine Knee Flexion: AROM;AAROM;Right;20 reps (1x10 AROM and 1x10 AAROM)   Shoulder Instructions       General Comments      Pertinent Vitals/ Pain       Pain Assessment: Faces Pain Score: 5  Faces Pain Scale: Hurts little more Pain Location: R knee Pain Descriptors / Indicators: Aching;Sore Pain Intervention(s): Monitored during session;Premedicated before session;Repositioned;Ice applied  Home Living                                          Prior Functioning/Environment              Frequency  Min 2X/week        Progress Toward Goals  OT Goals(current goals can now be found in the care plan section)  Progress towards OT goals: Progressing toward goals;Goals met/education completed, patient discharged from OT  Acute Rehab OT Goals  Patient Stated Goal: Return home OT Goal Formulation: With patient Time For Goal Achievement: 08/07/16 Potential to Achieve Goals: Good ADL Goals Pt Will Perform Grooming: with supervision;with set-up;standing Pt Will Perform Lower Body Bathing: with min assist;with caregiver independent in assisting;sitting/lateral leans;sit to/from stand Pt Will Perform Lower Body Dressing: with min assist;with caregiver independent in assisting;sitting/lateral leans;sit to/from stand Pt Will Transfer to Toilet: with supervision;with modified independence;ambulating Pt Will Perform Toileting - Clothing Manipulation and hygiene: with min guard assist;with supervision;sit to/from stand;sitting/lateral leans Pt Will Perform Tub/Shower Transfer: with supervision;with modified independence;ambulating;shower seat;3 in 1;with caregiver  independent in assisting;rolling walker  Plan Discharge plan remains appropriate    Co-evaluation                 End of Session Equipment Utilized During Treatment: Rolling walker  OT Visit Diagnosis: Other abnormalities of gait and mobility (R26.89);Pain   Activity Tolerance Patient tolerated treatment well   Patient Left in chair;with call bell/phone within reach   Nurse Communication Mobility status;Precautions        Time: [id number] OT Time Calculation (min): 15 min  Charges: OT General Charges $OT Visit: 1 Procedure OT Treatments $Self Care/Home Management : 8-22 mins   Jeri Modena   OTR/L Pager: [phone] Office: [phone] .    Parke Poisson B 08/01/2016, 2:45 PM

## 2016-08-01 NOTE — Discharge Summary (Signed)
Patient ID: Zoe Kim MRN: [id number] DOB/AGE: 06-17-1945 71 y.o.  Admit date: 07/30/2016 Discharge date: 08/01/2016  Admission Diagnoses:  Principal Problem:   Primary localized osteoarthrosis of the knee, right Active Problems:   Anxiety   Urinary urgency   Primary localized osteoarthritis of right knee   Discharge Diagnoses:  Same  Past Medical History:  Diagnosis Date  . Anxiety   . Arthritis   . Depression   . Heart murmur    "years ago"  . Insomnia   . Pneumonia    "long time ago"  . Primary localized osteoarthrosis of the knee, right   . Urinary urgency     Surgeries: Procedure(s): TOTAL KNEE ARTHROPLASTY on 07/30/2016   Consultants:   Discharged Condition: Improved  Hospital Course: Zoe Kim is an 71 y.o. female who was admitted 07/30/2016 for operative treatment ofPrimary localized osteoarthrosis of the knee, right. Patient has severe unremitting pain that affects sleep, daily activities, and work/hobbies. After pre-op clearance the patient was taken to the operating room on 07/30/2016 and underwent  Procedure(s): TOTAL KNEE ARTHROPLASTY.    Patient was given perioperative antibiotics: Anti-infectives    Start     Dose/Rate Route Frequency Ordered Stop   07/30/16 1400  ceFAZolin (ANCEF) IVPB 2g/100 mL premix     2 g 200 mL/hr over 30 Minutes Intravenous Every 6 hours 07/30/16 1140 07/30/16 2027   07/30/16 0853  cefUROXime (ZINACEF) injection  Status:  Discontinued       As needed 07/30/16 0854 07/30/16 1023   07/30/16 0700  ceFAZolin (ANCEF) IVPB 2g/100 mL premix     2 g 200 mL/hr over 30 Minutes Intravenous To [facility] 07/27/16 0658 07/30/16 0744       Patient was given sequential compression devices, early ambulation, and chemoprophylaxis to prevent DVT.  Patient benefited maximally from hospital stay and there were no complications.    Recent vital signs: Patient Vitals for the past 24 hrs:  BP Temp Temp src Pulse  Resp SpO2  08/01/16 0426 (!) 143/67 98.6 F (37 C) Oral 86 20 95 %  07/31/16 1300 140/63 98.4 F (36.9 C) Oral 79 20 93 %     Recent laboratory studies:  Recent Labs  07/31/16 0623 08/01/16 0508  WBC 13.8* 15.5*  HGB 11.0* 10.6*  HCT 32.7* 31.8*  PLT 219 233  NA 136 137  K 4.0 4.7  CL 106 102  CO2 23 28  BUN 9 11  CREATININE 0.77 0.76  GLUCOSE 151* 136*  CALCIUM 8.8* 9.3     Discharge Medications:   Allergies as of 08/01/2016      Reactions   Sulfa Antibiotics    UNSPECIFIED REACTION       Medication List    STOP taking these medications   HYDROcodone-acetaminophen 5-325 MG tablet Commonly known as:  NORCO/VICODIN     TAKE these medications   acetaminophen 500 MG tablet Commonly known as:  TYLENOL Take 1,000 mg by mouth every 6 (six) hours as needed for moderate pain.   aspirin EC 325 MG tablet 1 tab twice a day for the next 30 days to prevent blood clots   docusate sodium 100 MG capsule Commonly known as:  COLACE 1 tab 2 times a day while on narcotics.  STOOL SOFTENER   FLUoxetine 20 MG tablet Commonly known as:  PROZAC Take 1 tablet (20 mg total) by mouth daily. Can increase to 2 per day if needed.   MELATONIN PO Take 1  tablet by mouth at bedtime as needed (sleep).   oxybutynin 5 MG tablet Commonly known as:  DITROPAN Take 0.5-1 tablets (2.5-5 mg total) by mouth 3 (three) times daily.   oxyCODONE 5 MG immediate release tablet Commonly known as:  Oxy IR/ROXICODONE 1-2 tablets every 4-6 hrs as needed for pain   polyethylene glycol packet Commonly known as:  MIRALAX / GLYCOLAX 17grams in 6 oz of water twice a day until bowel movement.  LAXITIVE.  Restart if two days since last bowel movement       Diagnostic Studies: Dg Chest 2 View  Result Date: 07/05/2016 CLINICAL DATA:  Preoperative. EXAM: CHEST  2 VIEW COMPARISON:  None. FINDINGS: Cardiomediastinal silhouette is normal. Mediastinal contours appear intact. Ectatic aorta with calcific  atherosclerotic disease noted. There is no evidence of focal airspace consolidation, pleural effusion or pneumothorax. Osseous structures are without acute abnormality. Soft tissues are grossly normal. IMPRESSION: No active cardiopulmonary disease. Ectasia and calcific atherosclerotic disease of the aorta. Electronically Signed   By: Fidela Salisbury M.D.   On: 07/05/2016 16:59    Disposition: 01-Home or Self Care  Discharge Instructions    CPM    Complete by:  As directed    Continuous passive motion machine (CPM):      Use the CPM from 0 to 90 for 6 hours per day.       You may break it up into 2 or 3 sessions per day.      Use CPM for 2 weeks or until you are told to stop.   Call MD / Call 911    Complete by:  As directed    If you experience chest pain or shortness of breath, CALL 911 and be transported to the hospital emergency room.  If you develope a fever above 101 F, pus (white drainage) or increased drainage or redness at the wound, or calf pain, call your surgeon's office.   Change dressing    Complete by:  As directed    DO NOT REMOVE BANDAGE OVER SURGICAL INCISION.  [place] WHOLE LEG INCLUDING OVER THE WATERPROOF BANDAGE WITH SOAP AND WATER EVERY DAY.   Constipation Prevention    Complete by:  As directed    Drink plenty of fluids.  Prune juice may be helpful.  You may use a stool softener, such as Colace (over the counter) 100 mg twice a day.  Use MiraLax (over the counter) for constipation as needed.   Diet - low sodium heart healthy    Complete by:  As directed    Discharge instructions    Complete by:  As directed    INSTRUCTIONS AFTER JOINT REPLACEMENT   Remove items at home which could result in a fall. This includes throw rugs or furniture in walking pathways ICE to the affected joint every three hours while awake for 30 minutes at a time, for at least the first 3-5 days, and then as needed for pain and swelling.  Continue to use ice for pain and swelling. You may  notice swelling that will progress down to the foot and ankle.  This is normal after surgery.  Elevate your leg when you are not up walking on it.   Continue to use the breathing machine you got in the hospital (incentive spirometer) which will help keep your temperature down.  It is common for your temperature to cycle up and down following surgery, especially at night when you are not up moving around and exerting yourself.  The breathing machine keeps your lungs expanded and your temperature down.   DIET:  As you were doing prior to hospitalization, we recommend a well-balanced diet.  DRESSING / WOUND CARE / SHOWERING  Keep the surgical dressing until follow up.  The dressing is water proof, so you can shower without any extra covering.  IF THE DRESSING FALLS OFF or the wound gets wet inside, change the dressing with sterile gauze.  Please use good hand washing techniques before changing the dressing.  Do not use any lotions or creams on the incision until instructed by your surgeon.    ACTIVITY  Increase activity slowly as tolerated, but follow the weight bearing instructions below.   No driving for 6 weeks or until further direction given by your physician.  You cannot drive while taking narcotics.  No lifting or carrying greater than 10 lbs. until further directed by your surgeon. Avoid periods of inactivity such as sitting longer than an hour when not asleep. This helps prevent blood clots.  You may return to work once you are authorized by your doctor.     WEIGHT BEARING   Weight bearing as tolerated with assist device (walker, cane, etc) as directed, use it as long as suggested by your surgeon or therapist, typically at least 2-3 weeks.   EXERCISES  Results after joint replacement surgery are often greatly improved when you follow the exercise, range of motion and muscle strengthening exercises prescribed by your doctor. Safety measures are also important to protect the joint from  further injury. Any time any of these exercises cause you to have increased pain or swelling, decrease what you are doing until you are comfortable again and then slowly increase them. If you have problems or questions, call your caregiver or physical therapist for advice.   Rehabilitation is important following a joint replacement. After just a few days of immobilization, the muscles of the leg can become weakened and shrink (atrophy).  These exercises are designed to build up the tone and strength of the thigh and leg muscles and to improve motion. Often times heat used for twenty to thirty minutes before working out will loosen up your tissues and help with improving the range of motion but do not use heat for the first two weeks following surgery (sometimes heat can increase post-operative swelling).   These exercises can be done on a training (exercise) mat, on the floor, on a table or on a bed. Use whatever works the best and is most comfortable for you.    Use music or television while you are exercising so that the exercises are a pleasant break in your day. This will make your life better with the exercises acting as a break in your routine that you can look forward to.   Perform all exercises about fifteen times, three times per day or as directed.  You should exercise both the operative leg and the other leg as well.   Exercises include:  Quad Sets - Tighten up the muscle on the front of the thigh (Quad) and hold for 5-10 seconds.   Straight Leg Raises - With your knee straight (if you were given a brace, keep it on), lift the leg to 60 degrees, hold for 3 seconds, and slowly lower the leg.  Perform this exercise against resistance later as your leg gets stronger.  Leg Slides: Lying on your back, slowly slide your foot toward your buttocks, bending your knee up off the floor (only go as  far as is comfortable). Then slowly slide your foot back down until your leg is flat on the floor again.   Angel Wings: Lying on your back spread your legs to the side as far apart as you can without causing discomfort.  Hamstring Strength:  Lying on your back, push your heel against the floor with your leg straight by tightening up the muscles of your buttocks.  Repeat, but this time bend your knee to a comfortable angle, and push your heel against the floor.  You may put a pillow under the heel to make it more comfortable if necessary.   A rehabilitation program following joint replacement surgery can speed recovery and prevent re-injury in the future due to weakened muscles. Contact your doctor or a physical therapist for more information on knee rehabilitation.    CONSTIPATION  Constipation is defined medically as fewer than three stools per week and severe constipation as less than one stool per week.  Even if you have a regular bowel pattern at home, your normal regimen is likely to be disrupted due to multiple reasons following surgery.  Combination of anesthesia, postoperative narcotics, change in appetite and fluid intake all can affect your bowels.   YOU MUST use at least one of the following options; they are listed in order of increasing strength to get the job done.  They are all available over the counter, and you may need to use some, POSSIBLY even all of these options:    Drink plenty of fluids (prune juice may be helpful) and high fiber foods Colace 100 mg by mouth twice a day  Senokot for constipation as directed and as needed Dulcolax (bisacodyl), take with full glass of water  Miralax (polyethylene glycol) once or twice a day as needed.  If you have tried all these things and are unable to have a bowel movement in the first 3-4 days after surgery call either your surgeon or your primary doctor.    If you experience loose stools or diarrhea, hold the medications until you stool forms back up.  If your symptoms do not get better within 1 week or if they get worse, check with your  doctor.  If you experience "the worst abdominal pain ever" or develop nausea or vomiting, please contact the office immediately for further recommendations for treatment.   ITCHING:  If you experience itching with your medications, try taking only a single pain pill, or even half a pain pill at a time.  You can also use Benadryl over the counter for itching or also to help with sleep.   TED HOSE STOCKINGS:  Use stockings on both legs until for at least 2 weeks or as directed by physician office. They may be removed at night for sleeping.  MEDICATIONS:  See your medication summary on the "After Visit Summary" that nursing will review with you.  You may have some home medications which will be placed on hold until you complete the course of blood thinner medication.  It is important for you to complete the blood thinner medication as prescribed.  PRECAUTIONS:  If you experience chest pain or shortness of breath - call 911 immediately for transfer to the hospital emergency department.   If you develop a fever greater that 101 F, purulent drainage from wound, increased redness or drainage from wound, foul odor from the wound/dressing, or calf pain - CONTACT YOUR SURGEON.  FOLLOW-UP APPOINTMENTS:  If you do not already have a post-op appointment, please call the office for an appointment to be seen by your surgeon.  Guidelines for how soon to be seen are listed in your "After Visit Summary", but are typically between 1-4 weeks after surgery.  OTHER INSTRUCTIONS:   Knee Replacement:  Do not place pillow under knee, focus on keeping the knee straight while resting. CPM instructions: 0-90 degrees, 2 hours in the morning, 2 hours in the afternoon, and 2 hours in the evening. Place foam block, curve side up under heel at all times except when in CPM or when walking.  DO NOT modify, tear, cut, or change the foam block in any way.  MAKE SURE YOU:  Understand  these instructions.  Get help right away if you are not doing well or get worse.    Thank you for letting us be a part of your medical care team.  It is a privilege we respect greatly.  We hope these instructions will help you stay on track for a fast and full recovery!   Do not put a pillow under the knee. Place it under the heel.    Complete by:  As directed    Place gray foam block, curve side up under heel at all times except when in CPM or when walking.  DO NOT modify, tear, cut, or change in any way the gray foam block.   Increase activity slowly as tolerated    Complete by:  As directed    Patient may shower    Complete by:  As directed    Aquacel dressing is water proof    Wash over it and the whole leg with soap and water at the end of your shower   TED hose    Complete by:  As directed    Use stockings (TED hose) for 2 weeks on both leg(s).  You may remove them at night for sleeping.      Follow-up Information    [facility] Follow up.   Specialty:  [place] Why:  Someone from [facility] will contact you to arrange start date and time for therapy. Contact information: [address] [place] [address] [phone]        Lorn Junes, MD Follow up on 08/13/2016.   Specialty:  Orthopedic Surgery Why:  appt time 2 pm Contact information: [address] [place] [address] [phone]            Signed: Linda Hedges 08/01/2016, 10:37 AM

## 2016-08-01 NOTE — Progress Notes (Signed)
Physical Therapy Treatment Patient Details Name: Zoe Kim MRN: [id number] DOB: 03-28-1946 Today's Date: 08/01/2016    History of Present Illness Pt with R knee OA, now s/p R TKA 07/30/16.    PT Comments    Pt performed increased activity reviewing HEP in it's entirety.  Pt reviewed stair training for safe entry into home.  Informed nursing that patient is ready to d/c.  Left patient in gym with OT post session.    Follow Up Recommendations  Home health PT     Equipment Recommendations  Rolling walker with 5" wheels    Recommendations for Other Services       Precautions / Restrictions Precautions Precautions: Knee Precaution Booklet Issued: No Restrictions Weight Bearing Restrictions: Yes RLE Weight Bearing: Weight bearing as tolerated    Mobility  Bed Mobility               General bed mobility comments: Pt received in recliner on arrival.    Transfers Overall transfer level: Needs assistance Equipment used: Rolling walker (2 wheeled) Transfers: Sit to/from Stand Sit to Stand: Supervision         General transfer comment: Remains to required cues for hand placement to avoid pulling on RW.    Ambulation/Gait Ambulation/Gait assistance: Supervision Ambulation Distance (Feet): 200 Feet Assistive device: Rolling walker (2 wheeled)   Gait velocity: Decreased Gait velocity interpretation: Below normal speed for age/gender General Gait Details: Cues for equal weight distribution, cues for upper trunk control and continuation of movement with RW to maintain step through pattern.     Stairs Stairs: Yes   Stair Management: One rail Left;Backwards;Forwards Number of Stairs: 5 General stair comments: Forwards to ascend and backwards to descend.  Cues for hand placement on railing and sequencing. Supervision  Wheelchair Mobility    Modified Rankin (Stroke Patients Only)       Balance Overall balance assessment: Needs  assistance Sitting-balance support: No upper extremity supported;Feet supported Sitting balance-Leahy Scale: Good       Standing balance-Leahy Scale: Fair                      Cognition Arousal/Alertness: Awake/alert Behavior During Therapy: WFL for tasks assessed/performed Overall Cognitive Status: Within Functional Limits for tasks assessed                      Exercises Total Joint Exercises Ankle Circles/Pumps: AROM;Both;10 reps;Supine Quad Sets: AROM;Right;10 reps;Supine Short Arc Quad: AROM;Right;10 reps;Supine Heel Slides: AROM;Right;10 reps;Supine Hip ABduction/ADduction: AROM;Right;10 reps;Supine Straight Leg Raises: AROM;Right;10 reps;Supine Long Arc Quad: AROM;Right;10 reps;Supine Knee Flexion: AROM;AAROM;Right;20 reps (1x10 AROM and 1x10 AAROM)    General Comments        Pertinent Vitals/Pain Pain Assessment: 0-10 Pain Score: 5  Pain Location: R knee Pain Descriptors / Indicators: Aching;Sore Pain Intervention(s): Monitored during session    Home Living                      Prior Function            PT Goals (current goals can now be found in the care plan section) Acute Rehab PT Goals Patient Stated Goal: Return home Potential to Achieve Goals: Good Progress towards PT goals: Progressing toward goals    Frequency    7X/week      PT Plan Current plan remains appropriate    Co-evaluation             End of  Session Equipment Utilized During Treatment: Gait belt Activity Tolerance: Patient tolerated treatment well Patient left: in PT gym with OT for OT session.   Nurse Communication: Mobility status PT Visit Diagnosis: Muscle weakness (generalized) (M62.81);Unsteadiness on feet (R26.81)    Time: [id number] PT Time Calculation (min) (ACUTE ONLY): 19 min  Charges:  $Therapeutic Exercise: 8-22 mins                    G Codes:       Cristela Blue August 29, 2016, 12:14 PM

## 2016-08-01 NOTE — Progress Notes (Signed)
Reviewed discharge instructions/mediations with patient and patient's husband.  Answered their questions.  Patient waiting on volunteer to wheel her out.

## 2016-08-02 DIAGNOSIS — R3915 Urgency of urination: Secondary | ICD-10-CM | POA: Diagnosis not present

## 2016-08-02 DIAGNOSIS — Z7982 Long term (current) use of aspirin: Secondary | ICD-10-CM | POA: Diagnosis not present

## 2016-08-02 DIAGNOSIS — Z471 Aftercare following joint replacement surgery: Secondary | ICD-10-CM | POA: Diagnosis not present

## 2016-08-02 DIAGNOSIS — F419 Anxiety disorder, unspecified: Secondary | ICD-10-CM | POA: Diagnosis not present

## 2016-08-02 DIAGNOSIS — Z96651 Presence of right artificial knee joint: Secondary | ICD-10-CM | POA: Diagnosis not present

## 2016-08-02 DIAGNOSIS — Z79891 Long term (current) use of opiate analgesic: Secondary | ICD-10-CM | POA: Diagnosis not present

## 2016-08-07 DIAGNOSIS — F419 Anxiety disorder, unspecified: Secondary | ICD-10-CM | POA: Diagnosis not present

## 2016-08-07 DIAGNOSIS — R3915 Urgency of urination: Secondary | ICD-10-CM | POA: Diagnosis not present

## 2016-08-07 DIAGNOSIS — Z79891 Long term (current) use of opiate analgesic: Secondary | ICD-10-CM | POA: Diagnosis not present

## 2016-08-07 DIAGNOSIS — Z7982 Long term (current) use of aspirin: Secondary | ICD-10-CM | POA: Diagnosis not present

## 2016-08-07 DIAGNOSIS — Z96651 Presence of right artificial knee joint: Secondary | ICD-10-CM | POA: Diagnosis not present

## 2016-08-07 DIAGNOSIS — Z471 Aftercare following joint replacement surgery: Secondary | ICD-10-CM | POA: Diagnosis not present

## 2016-08-13 DIAGNOSIS — M1711 Unilateral primary osteoarthritis, right knee: Secondary | ICD-10-CM | POA: Diagnosis not present

## 2016-08-13 DIAGNOSIS — Z7982 Long term (current) use of aspirin: Secondary | ICD-10-CM | POA: Diagnosis not present

## 2016-08-13 DIAGNOSIS — F419 Anxiety disorder, unspecified: Secondary | ICD-10-CM | POA: Diagnosis not present

## 2016-08-13 DIAGNOSIS — Z471 Aftercare following joint replacement surgery: Secondary | ICD-10-CM | POA: Diagnosis not present

## 2016-08-13 DIAGNOSIS — R3915 Urgency of urination: Secondary | ICD-10-CM | POA: Diagnosis not present

## 2016-08-13 DIAGNOSIS — Z79891 Long term (current) use of opiate analgesic: Secondary | ICD-10-CM | POA: Diagnosis not present

## 2016-08-13 DIAGNOSIS — Z96651 Presence of right artificial knee joint: Secondary | ICD-10-CM | POA: Diagnosis not present

## 2016-08-16 DIAGNOSIS — R262 Difficulty in walking, not elsewhere classified: Secondary | ICD-10-CM | POA: Diagnosis not present

## 2016-08-16 DIAGNOSIS — M6281 Muscle weakness (generalized): Secondary | ICD-10-CM | POA: Diagnosis not present

## 2016-08-16 DIAGNOSIS — M25561 Pain in right knee: Secondary | ICD-10-CM | POA: Diagnosis not present

## 2016-08-16 DIAGNOSIS — M25661 Stiffness of right knee, not elsewhere classified: Secondary | ICD-10-CM | POA: Diagnosis not present

## 2016-08-22 DIAGNOSIS — M25661 Stiffness of right knee, not elsewhere classified: Secondary | ICD-10-CM | POA: Diagnosis not present

## 2016-08-22 DIAGNOSIS — R262 Difficulty in walking, not elsewhere classified: Secondary | ICD-10-CM | POA: Diagnosis not present

## 2016-08-22 DIAGNOSIS — M25561 Pain in right knee: Secondary | ICD-10-CM | POA: Diagnosis not present

## 2016-08-22 DIAGNOSIS — M6281 Muscle weakness (generalized): Secondary | ICD-10-CM | POA: Diagnosis not present

## 2016-08-28 ENCOUNTER — Ambulatory Visit (INDEPENDENT_AMBULATORY_CARE_PROVIDER_SITE_OTHER): Payer: PPO | Admitting: Family Medicine

## 2016-08-28 VITALS — BP 161/78 | HR 88 | Temp 98.0°F | Resp 16 | Ht 65.0 in | Wt 183.6 lb

## 2016-08-28 DIAGNOSIS — F3289 Other specified depressive episodes: Secondary | ICD-10-CM

## 2016-08-28 DIAGNOSIS — F418 Other specified anxiety disorders: Secondary | ICD-10-CM

## 2016-08-28 DIAGNOSIS — M25661 Stiffness of right knee, not elsewhere classified: Secondary | ICD-10-CM | POA: Diagnosis not present

## 2016-08-28 DIAGNOSIS — M6281 Muscle weakness (generalized): Secondary | ICD-10-CM | POA: Diagnosis not present

## 2016-08-28 DIAGNOSIS — M25561 Pain in right knee: Secondary | ICD-10-CM | POA: Diagnosis not present

## 2016-08-28 DIAGNOSIS — R262 Difficulty in walking, not elsewhere classified: Secondary | ICD-10-CM | POA: Diagnosis not present

## 2016-08-28 MED ORDER — CLONAZEPAM 0.5 MG PO TABS: 0.2500 mg | ORAL_TABLET | Freq: Two times a day (BID) | ORAL | 1 refills | Status: DC | PRN

## 2016-08-28 NOTE — Patient Instructions (Addendum)
Increase Prozac to 2 per day, and klonopin if needed up to twice per day.  You should notice some improvement in depression in next 4-6 weeks at higher dose.  Let me know in the meantime if you are having new side effects (Mychart message is fine).  If other meds needed in 6 weeks, can discuss additional options. If worsening - return to discuss options sooner. See some other info on stress and stress management below.     Major Depressive Disorder, Adult Major depressive disorder (MDD) is a mental health condition. It may also be called clinical depression or unipolar depression. MDD usually causes feelings of sadness, hopelessness, or helplessness. MDD can also cause physical symptoms. It can interfere with work, school, relationships, and other everyday activities. MDD may be mild, moderate, or severe. It may occur once (single episode major depressive disorder) or it may occur multiple times (recurrent major depressive disorder). What are the causes? The exact cause of this condition is not known. MDD is most likely caused by a combination of things, which may include:  Genetic factors. These are traits that are passed along from parent to child.  Individual factors. Your personality, your behavior, and the way you handle your thoughts and feelings may contribute to MDD. This includes personality traits and behaviors learned from others.  Physical factors, such as:  Differences in the part of your brain that controls emotion. This part of your brain may be different than it is in people who do not have MDD.  Long-term (chronic) medical or psychiatric illnesses.  Social factors. Traumatic experiences or major life changes may play a role in the development of MDD. What increases the risk? This condition is more likely to develop in women. The following factors may also make you more likely to develop MDD:  A family history of depression.  Troubled family relationships.  Abnormally low  levels of certain brain chemicals.  Traumatic events in childhood, especially abuse or the loss of a parent.  Being under a lot of stress, or long-term stress, especially from upsetting life experiences or losses.  A history of:  Chronic physical illness.  Other mental health disorders.  Substance abuse.  Poor living conditions.  Experiencing social exclusion or discrimination on a regular basis. What are the signs or symptoms? The main symptoms of MDD typically include:  Constant depressed or irritable mood.  Loss of interest in things and activities. MDD symptoms may also include:  Sleeping or eating too much or too little.  Unexplained weight change.  Fatigue or low energy.  Feelings of worthlessness or guilt.  Difficulty thinking clearly or making decisions.  Thoughts of suicide or of harming others.  Physical agitation or weakness.  Isolation. Severe cases of MDD may also occur with other symptoms, such as:  Delusions or hallucinations, in which you imagine things that are not real (psychotic depression).  Low-level depression that lasts at least a year (chronic depression or persistent depressive disorder).  Extreme sadness and hopelessness (melancholic depression).  Trouble speaking and moving (catatonic depression). How is this diagnosed? This condition may be diagnosed based on:  Your symptoms.  Your medical history, including your mental health history. This may involve tests to evaluate your mental health. You may be asked questions about your lifestyle, including any drug and alcohol use, and how long you have had symptoms of MDD.  A physical exam.  Blood tests to rule out other conditions. You must have a depressed mood and at least four  other MDD symptoms most of the day, nearly every day in the same 2-week timeframe before your health care provider can confirm a diagnosis of MDD. How is this treated? This condition is usually treated by  mental health professionals, such as psychologists, psychiatrists, and clinical social workers. You may need more than one type of treatment. Treatment may include:  Psychotherapy. This is also called talk therapy or counseling. Types of psychotherapy include:  Cognitive behavioral therapy (CBT). This type of therapy teaches you to recognize unhealthy feelings, thoughts, and behaviors, and replace them with positive thoughts and actions.  Interpersonal therapy (IPT). This helps you to improve the way you relate to and communicate with others.  Family therapy. This treatment includes members of your family.  Medicine to treat anxiety and depression, or to help you control certain emotions and behaviors.  Lifestyle changes, such as:  Limiting alcohol and drug use.  Exercising regularly.  Getting plenty of sleep.  Making healthy eating choices.  Spending more time outdoors. Treatments involving stimulation of the brain can be used in situations with extremely severe symptoms, or when medicine or other therapies do not work over time. These treatments include electroconvulsive therapy, transcranial magnetic stimulation, and vagal nerve stimulation. Follow these instructions at home: Activity   Return to your normal activities as told by your health care provider.  Exercise regularly and spend time outdoors as told by your health care provider. General instructions   Take over-the-counter and prescription medicines only as told by your health care provider.  Do not drink alcohol. If you drink alcohol, limit your alcohol intake to no more than 1 drink a day for nonpregnant women and 2 drinks a day for men. One drink equals 12 oz of beer, 5 oz of wine, or 1 oz of hard liquor. Alcohol can affect any antidepressant medicines you are taking. Talk to your health care provider about your alcohol use.  Eat a healthy diet and get plenty of sleep.  Find activities that you enjoy doing, and  make time to do them.  Consider joining a support group. Your health care provider may be able to recommend a support group.  Keep all follow-up visits as told by your health care provider. This is important. Where to find more information: [employer] on Mental Illness  www.nami.org [place] National Institute of Mental Health  https://carter.com/ National Suicide Prevention Lifeline  1-800-273-TALK 458-408-3785). This is free, 24-hour help. Contact a health care provider if:  Your symptoms get worse.  You develop new symptoms. Get help right away if:  You self-harm.  You have serious thoughts about hurting yourself or others.  You see, hear, taste, smell, or feel things that are not present (hallucinate). This information is not intended to replace advice given to you by your health care provider. Make sure you discuss any questions you have with your health care provider. Document Released: 09/15/2012 Document Revised: 01/26/2016 Document Reviewed: 11/30/2015 Elsevier Interactive Patient Education  2017 [employer] and Stress Management Stress is a normal reaction to life events. It is what you feel when life demands more than you are used to or more than you can handle. Some stress can be useful. For example, the stress reaction can help you catch the last bus of the day, study for a test, or meet a deadline at work. But stress that occurs too often or for too long can cause problems. It can affect your emotional health and interfere with relationships  and normal daily activities. Too much stress can weaken your immune system and increase your risk for physical illness. If you already have a medical problem, stress can make it worse. What are the causes? All sorts of life events may cause stress. An event that causes stress for one person may not be stressful for another person. Major life events commonly cause stress. These may be positive or negative. Examples include  losing your job, moving into a new home, getting married, having a baby, or losing a loved one. Less obvious life events may also cause stress, especially if they occur day after day or in combination. Examples include working long hours, driving in traffic, caring for children, being in debt, or being in a difficult relationship. What are the signs or symptoms? Stress may cause emotional symptoms including, the following:  Anxiety. This is feeling worried, afraid, on edge, overwhelmed, or out of control.  Anger. This is feeling irritated or impatient.  Depression. This is feeling sad, down, helpless, or guilty.  Difficulty focusing, remembering, or making decisions. Stress may cause physical symptoms, including the following:  Aches and pains. These may affect your head, neck, back, stomach, or other areas of your body.  Tight muscles or clenched jaw.  Low energy or trouble sleeping. Stress may cause unhealthy behaviors, including the following:  Eating to feel better (overeating) or skipping meals.  Sleeping too little, too much, or both.  Working too much or putting off tasks (procrastination).  Smoking, drinking alcohol, or using drugs to feel better. How is this diagnosed? Stress is diagnosed through an assessment by your health care provider. Your health care provider will ask questions about your symptoms and any stressful life events.Your health care provider will also ask about your medical history and may order blood tests or other tests. Certain medical conditions and medicine can cause physical symptoms similar to stress. Mental illness can cause emotional symptoms and unhealthy behaviors similar to stress. Your health care provider may refer you to a mental health professional for further evaluation. How is this treated? Stress management is the recommended treatment for stress.The goals of stress management are reducing stressful life events and coping with stress in  healthy ways. Techniques for reducing stressful life events include the following:  Stress identification. Self-monitor for stress and identify what causes stress for you. These skills may help you to avoid some stressful events.  Time management. Set your priorities, keep a calendar of events, and learn to say "no." These tools can help you avoid making too many commitments. Techniques for coping with stress include the following:  Rethinking the problem. Try to think realistically about stressful events rather than ignoring them or overreacting. Try to find the positives in a stressful situation rather than focusing on the negatives.  Exercise. Physical exercise can release both physical and emotional tension. The key is to find a form of exercise you enjoy and do it regularly.  Relaxation techniques. These relax the body and mind. Examples include yoga, meditation, tai chi, biofeedback, deep breathing, progressive muscle relaxation, listening to music, being out in nature, journaling, and other hobbies. Again, the key is to find one or more that you enjoy and can do regularly.  Healthy lifestyle. Eat a balanced diet, get plenty of sleep, and do not smoke. Avoid using alcohol or drugs to relax.  Strong support network. Spend time with family, friends, or other people you enjoy being around.Express your feelings and talk things over with someone you trust.  Counseling or talktherapy with a mental health professional may be helpful if you are having difficulty managing stress on your own. Medicine is typically not recommended for the treatment of stress.Talk to your health care provider if you think you need medicine for symptoms of stress. Follow these instructions at home:  Keep all follow-up visits as directed by your health care provider.  Take all medicines as directed by your health care provider. Contact a health care provider if:  Your symptoms get worse or you start having new  symptoms.  You feel overwhelmed by your problems and can no longer manage them on your own. Get help right away if:  You feel like hurting yourself or someone else. This information is not intended to replace advice given to you by your health care provider. Make sure you discuss any questions you have with your health care provider. Document Released: 11/14/2000 Document Revised: 10/27/2015 Document Reviewed: 01/13/2013 Elsevier Interactive Patient Education  2017 [employer].   IF you received an x-ray today, you will receive an invoice from [facility]. Please contact [facility] at [phone] with questions or concerns regarding your invoice.   IF you received labwork today, you will receive an invoice from [facility]. Please contact [facility] at [phone] with questions or concerns regarding your invoice.   Our billing staff will not be able to assist you with questions regarding bills from these companies.  You will be contacted with the lab results as soon as they are available. The fastest way to get your results is to activate your My Chart account. Instructions are located on the last page of this paperwork. If you have not heard from [place] regarding the results in 2 weeks, please contact this office.

## 2016-08-28 NOTE — Progress Notes (Signed)
Subjective:  By signing my name below, I, Zoe Kim, attest that this documentation has been prepared under the direction and in the presence of Zoe Agreste, MD Electronically Signed: Ladene Artist, ED Scribe 08/28/2016 at 1:01 PM.   Patient ID: Zoe Kim, female    DOB: Sep 11, 1945, 71 y.o.   MRN: [id number]  Chief Complaint  Patient presents with  . Medication Problem    doesn't feel prozac is working    HPI [employer] is a 71 y.o. female who presents to Primary Care at [facility] to discuss medications. Pt recently had a total right knee arthroplasty in 07/2016 and has noticed increased depressed mood. States that she has not had the motivation to due anything including dancing which she loves. Pt has not tried increasing Prozac but wants to discuss possibly adding Trintellix. She is not currently taking anything for post-surgery pain; states that she is only experiencing right knee pain during physical therapy which she had today. Pt denies difficulty falling asleep but reports difficulty staying asleep. She states that she wakes up due to anxiety. She has tried Klonopin in 2015 with relief. She also denies SI or alcohol consumption.   Patient Active Problem List   Diagnosis Date Noted  . Primary localized osteoarthritis of right knee 07/30/2016  . Anxiety   . Primary localized osteoarthrosis of the knee, right   . Urinary urgency    Past Medical History:  Diagnosis Date  . Anxiety   . Arthritis   . Depression   . Heart murmur    "years ago"  . Insomnia   . Pneumonia    "long time ago"  . Primary localized osteoarthrosis of the knee, right   . Urinary urgency    Past Surgical History:  Procedure Laterality Date  . COLONOSCOPY    . EYE SURGERY Bilateral    cataract with lens  . TONSILLECTOMY    . TOTAL KNEE ARTHROPLASTY Right 07/30/2016   Procedure: TOTAL KNEE ARTHROPLASTY;  Surgeon: Elsie Saas, MD;  Location: [facility];  Service: Orthopedics;   Laterality: Right;  . VEIN LIGATION AND STRIPPING     Allergies  Allergen Reactions  . Sulfa Antibiotics     UNSPECIFIED REACTION    Prior to Admission medications   Medication Sig Start Date End Date Taking? Authorizing Provider  acetaminophen (TYLENOL) 500 MG tablet Take 1,000 mg by mouth every 6 (six) hours as needed for moderate pain.   Yes Historical Provider, MD  aspirin EC 325 MG tablet 1 tab twice a day for the next 30 days to prevent blood clots 08/01/16  Yes Kirstin Shepperson, PA-C  docusate sodium (COLACE) 100 MG capsule 1 tab 2 times a day while on narcotics.  STOOL SOFTENER 08/01/16  Yes Kirstin Shepperson, PA-C  FLUoxetine (PROZAC) 20 MG tablet Take 1 tablet (20 mg total) by mouth daily. Can increase to 2 per day if needed. 07/05/16  Yes Zoe Agreste, MD  MELATONIN PO Take 1 tablet by mouth at bedtime as needed (sleep).   Yes Historical Provider, MD  oxybutynin (DITROPAN) 5 MG tablet Take 0.5-1 tablets (2.5-5 mg total) by mouth 3 (three) times daily. 07/05/16  Yes Zoe Agreste, MD  polyethylene glycol [facility] / GLYCOLAX) packet 17grams in 6 oz of water twice a day until bowel movement.  LAXITIVE.  Restart if two days since last bowel movement 08/01/16  Yes Kirstin Shepperson, PA-C  oxyCODONE (OXY IR/ROXICODONE) 5 MG immediate release tablet 1-2 tablets every  4-6 hrs as needed for pain Patient not taking: Reported on 08/28/2016 08/01/16   Matthew Saras, PA-C   Social History   Social History  . Marital status: Married    Spouse name: N/A  . Number of children: N/A  . Years of education: N/A   Occupational History  . Not on file.   Social History Main Topics  . Smoking status: Never Smoker  . Smokeless tobacco: Never Used     Comment: smoked 2 weeks in college  . Alcohol use No  . Drug use: No  . Sexual activity: Yes   Other Topics Concern  . Not on file   Social History Narrative  . No narrative on file   Review of Systems  Psychiatric/Behavioral:  Positive for dysphoric mood and sleep disturbance. Negative for suicidal ideas. The patient is nervous/anxious.       Objective:   Physical Exam  Constitutional: She is oriented to person, place, and time. She appears well-developed and well-nourished. No distress.  HENT:  Head: Normocephalic and atraumatic.  Eyes: Conjunctivae and EOM are normal.  Neck: Neck supple. No tracheal deviation present.  Cardiovascular: Normal rate, regular rhythm and normal heart sounds.   Pulmonary/Chest: Effort normal and breath sounds normal. No respiratory distress.  Musculoskeletal: Normal range of motion.  Neurological: She is alert and oriented to person, place, and time.  Skin: Skin is warm and dry.  Psychiatric: She has a normal mood and affect. Her behavior is normal.  Nursing note and vitals reviewed.   Vitals:   08/28/16 1215  BP: (!) 161/78  Pulse: 88  Resp: 16  Temp: 98 F (36.7 C)  TempSrc: Oral  SpO2: 97%  Weight: 183 lb 9.6 oz (83.3 kg)  Height: '5\' 5"'$  (1.651 m)      Assessment & Plan:    Zoe Kim is a 71 y.o. female Situational anxiety - Plan: clonazePAM (KLONOPIN) 0.5 MG tablet  Other depression  Depression with anxiety, suspect some component of adjustment on top of underlying depression with her recent surgery and rehabilitation.  -Increase Prozac to 40 mg as discussed last visit, then a call or email through my chart with update in 6 weeks. Initial potential side effects discussed, and Klonopin can be used if needed for insomnia or situational anxiety for now. Long-term risks and side effects were discussed.  -She did ask about other additive therapies which could also be considered, but may achieve some results with higher dose of Prozac initially.   Meds ordered this encounter  Medications  . clonazePAM (KLONOPIN) 0.5 MG tablet    Sig: Take 0.5-1 tablets (0.25-0.5 mg total) by mouth 2 (two) times daily as needed for anxiety.    Dispense:  20 tablet     Refill:  1   Patient Instructions   Increase Prozac to 2 per day, and klonopin if needed up to twice per day.  You should notice some improvement in depression in next 4-6 weeks at higher dose.  Let me know in the meantime if you are having new side effects (Mychart message is fine).  If other meds needed in 6 weeks, can discuss additional options. If worsening - return to discuss options sooner. See some other info on stress and stress management below.     Major Depressive Disorder, Adult Major depressive disorder (MDD) is a mental health condition. It may also be called clinical depression or unipolar depression. MDD usually causes feelings of sadness, hopelessness, or helplessness. MDD can also  cause physical symptoms. It can interfere with work, school, relationships, and other everyday activities. MDD may be mild, moderate, or severe. It may occur once (single episode major depressive disorder) or it may occur multiple times (recurrent major depressive disorder). What are the causes? The exact cause of this condition is not known. MDD is most likely caused by a combination of things, which may include:  Genetic factors. These are traits that are passed along from parent to child.  Individual factors. Your personality, your behavior, and the way you handle your thoughts and feelings may contribute to MDD. This includes personality traits and behaviors learned from others.  Physical factors, such as:  Differences in the part of your brain that controls emotion. This part of your brain may be different than it is in people who do not have MDD.  Long-term (chronic) medical or psychiatric illnesses.  Social factors. Traumatic experiences or major life changes may play a role in the development of MDD. What increases the risk? This condition is more likely to develop in women. The following factors may also make you more likely to develop MDD:  A family history of depression.  Troubled  family relationships.  Abnormally low levels of certain brain chemicals.  Traumatic events in childhood, especially abuse or the loss of a parent.  Being under a lot of stress, or long-term stress, especially from upsetting life experiences or losses.  A history of:  Chronic physical illness.  Other mental health disorders.  Substance abuse.  Poor living conditions.  Experiencing social exclusion or discrimination on a regular basis. What are the signs or symptoms? The main symptoms of MDD typically include:  Constant depressed or irritable mood.  Loss of interest in things and activities. MDD symptoms may also include:  Sleeping or eating too much or too little.  Unexplained weight change.  Fatigue or low energy.  Feelings of worthlessness or guilt.  Difficulty thinking clearly or making decisions.  Thoughts of suicide or of harming others.  Physical agitation or weakness.  Isolation. Severe cases of MDD may also occur with other symptoms, such as:  Delusions or hallucinations, in which you imagine things that are not real (psychotic depression).  Low-level depression that lasts at least a year (chronic depression or persistent depressive disorder).  Extreme sadness and hopelessness (melancholic depression).  Trouble speaking and moving (catatonic depression). How is this diagnosed? This condition may be diagnosed based on:  Your symptoms.  Your medical history, including your mental health history. This may involve tests to evaluate your mental health. You may be asked questions about your lifestyle, including any drug and alcohol use, and how long you have had symptoms of MDD.  A physical exam.  Blood tests to rule out other conditions. You must have a depressed mood and at least four other MDD symptoms most of the day, nearly every day in the same 2-week timeframe before your health care provider can confirm a diagnosis of MDD. How is this  treated? This condition is usually treated by mental health professionals, such as psychologists, psychiatrists, and clinical social workers. You may need more than one type of treatment. Treatment may include:  Psychotherapy. This is also called talk therapy or counseling. Types of psychotherapy include:  Cognitive behavioral therapy (CBT). This type of therapy teaches you to recognize unhealthy feelings, thoughts, and behaviors, and replace them with positive thoughts and actions.  Interpersonal therapy (IPT). This helps you to improve the way you relate to and communicate with  others.  Family therapy. This treatment includes members of your family.  Medicine to treat anxiety and depression, or to help you control certain emotions and behaviors.  Lifestyle changes, such as:  Limiting alcohol and drug use.  Exercising regularly.  Getting plenty of sleep.  Making healthy eating choices.  Spending more time outdoors. Treatments involving stimulation of the brain can be used in situations with extremely severe symptoms, or when medicine or other therapies do not work over time. These treatments include electroconvulsive therapy, transcranial magnetic stimulation, and vagal nerve stimulation. Follow these instructions at home: Activity   Return to your normal activities as told by your health care provider.  Exercise regularly and spend time outdoors as told by your health care provider. General instructions   Take over-the-counter and prescription medicines only as told by your health care provider.  Do not drink alcohol. If you drink alcohol, limit your alcohol intake to no more than 1 drink a day for nonpregnant women and 2 drinks a day for men. One drink equals 12 oz of beer, 5 oz of wine, or 1 oz of hard liquor. Alcohol can affect any antidepressant medicines you are taking. Talk to your health care provider about your alcohol use.  Eat a healthy diet and get plenty of  sleep.  Find activities that you enjoy doing, and make time to do them.  Consider joining a support group. Your health care provider may be able to recommend a support group.  Keep all follow-up visits as told by your health care provider. This is important. Where to find more information: [employer] on Mental Illness  www.nami.org [place]. National Institute of Mental Health  https://carter.com/ National Suicide Prevention Lifeline  1-800-273-TALK 416-025-6436). This is free, 24-hour help. Contact a health care provider if:  Your symptoms get worse.  You develop new symptoms. Get help right away if:  You self-harm.  You have serious thoughts about hurting yourself or others.  You see, hear, taste, smell, or feel things that are not present (hallucinate). This information is not intended to replace advice given to you by your health care provider. Make sure you discuss any questions you have with your health care provider. Document Released: 09/15/2012 Document Revised: 01/26/2016 Document Reviewed: 11/30/2015 Elsevier Interactive Patient Education  2017 [employer] and Stress Management Stress is a normal reaction to life events. It is what you feel when life demands more than you are used to or more than you can handle. Some stress can be useful. For example, the stress reaction can help you catch the last bus of the day, study for a test, or meet a deadline at work. But stress that occurs too often or for too long can cause problems. It can affect your emotional health and interfere with relationships and normal daily activities. Too much stress can weaken your immune system and increase your risk for physical illness. If you already have a medical problem, stress can make it worse. What are the causes? All sorts of life events may cause stress. An event that causes stress for one person may not be stressful for another person. Major life events commonly cause stress.  These may be positive or negative. Examples include losing your job, moving into a new home, getting married, having a baby, or losing a loved one. Less obvious life events may also cause stress, especially if they occur day after day or in combination. Examples include working long hours, driving in traffic,  caring for children, being in debt, or being in a difficult relationship. What are the signs or symptoms? Stress may cause emotional symptoms including, the following:  Anxiety. This is feeling worried, afraid, on edge, overwhelmed, or out of control.  Anger. This is feeling irritated or impatient.  Depression. This is feeling sad, down, helpless, or guilty.  Difficulty focusing, remembering, or making decisions. Stress may cause physical symptoms, including the following:  Aches and pains. These may affect your head, neck, back, stomach, or other areas of your body.  Tight muscles or clenched jaw.  Low energy or trouble sleeping. Stress may cause unhealthy behaviors, including the following:  Eating to feel better (overeating) or skipping meals.  Sleeping too little, too much, or both.  Working too much or putting off tasks (procrastination).  Smoking, drinking alcohol, or using drugs to feel better. How is this diagnosed? Stress is diagnosed through an assessment by your health care provider. Your health care provider will ask questions about your symptoms and any stressful life events.Your health care provider will also ask about your medical history and may order blood tests or other tests. Certain medical conditions and medicine can cause physical symptoms similar to stress. Mental illness can cause emotional symptoms and unhealthy behaviors similar to stress. Your health care provider may refer you to a mental health professional for further evaluation. How is this treated? Stress management is the recommended treatment for stress.The goals of stress management are reducing  stressful life events and coping with stress in healthy ways. Techniques for reducing stressful life events include the following:  Stress identification. Self-monitor for stress and identify what causes stress for you. These skills may help you to avoid some stressful events.  Time management. Set your priorities, keep a calendar of events, and learn to say "no." These tools can help you avoid making too many commitments. Techniques for coping with stress include the following:  Rethinking the problem. Try to think realistically about stressful events rather than ignoring them or overreacting. Try to find the positives in a stressful situation rather than focusing on the negatives.  Exercise. Physical exercise can release both physical and emotional tension. The key is to find a form of exercise you enjoy and do it regularly.  Relaxation techniques. These relax the body and mind. Examples include yoga, meditation, tai chi, biofeedback, deep breathing, progressive muscle relaxation, listening to music, being out in nature, journaling, and other hobbies. Again, the key is to find one or more that you enjoy and can do regularly.  Healthy lifestyle. Eat a balanced diet, get plenty of sleep, and do not smoke. Avoid using alcohol or drugs to relax.  Strong support network. Spend time with family, friends, or other people you enjoy being around.Express your feelings and talk things over with someone you trust. Counseling or talktherapy with a mental health professional may be helpful if you are having difficulty managing stress on your own. Medicine is typically not recommended for the treatment of stress.Talk to your health care provider if you think you need medicine for symptoms of stress. Follow these instructions at home:  Keep all follow-up visits as directed by your health care provider.  Take all medicines as directed by your health care provider. Contact a health care provider if:  Your  symptoms get worse or you start having new symptoms.  You feel overwhelmed by your problems and can no longer manage them on your own. Get help right away if:  You feel like  hurting yourself or someone else. This information is not intended to replace advice given to you by your health care provider. Make sure you discuss any questions you have with your health care provider. Document Released: 11/14/2000 Document Revised: 10/27/2015 Document Reviewed: 01/13/2013 Elsevier Interactive Patient Education  2017 [employer].   IF you received an x-ray today, you will receive an invoice from [facility]. Please contact [facility] at [phone] with questions or concerns regarding your invoice.   IF you received labwork today, you will receive an invoice from [facility]. Please contact [facility] at [phone] with questions or concerns regarding your invoice.   Our billing staff will not be able to assist you with questions regarding bills from these companies.  You will be contacted with the lab results as soon as they are available. The fastest way to get your results is to activate your My Chart account. Instructions are located on the last page of this paperwork. If you have not heard from [place] regarding the results in 2 weeks, please contact this office.       I personally performed the services described in this documentation, which was scribed in my presence. The recorded information has been reviewed and considered for accuracy and completeness, addended by me as needed, and agree with information above.  Signed,   [name], MD [facility] at [facility].  08/29/16 10:20 PM

## 2016-08-30 DIAGNOSIS — M6281 Muscle weakness (generalized): Secondary | ICD-10-CM | POA: Diagnosis not present

## 2016-08-30 DIAGNOSIS — R262 Difficulty in walking, not elsewhere classified: Secondary | ICD-10-CM | POA: Diagnosis not present

## 2016-08-30 DIAGNOSIS — M25561 Pain in right knee: Secondary | ICD-10-CM | POA: Diagnosis not present

## 2016-08-30 DIAGNOSIS — M25661 Stiffness of right knee, not elsewhere classified: Secondary | ICD-10-CM | POA: Diagnosis not present

## 2016-09-05 DIAGNOSIS — M6281 Muscle weakness (generalized): Secondary | ICD-10-CM | POA: Diagnosis not present

## 2016-09-05 DIAGNOSIS — M25561 Pain in right knee: Secondary | ICD-10-CM | POA: Diagnosis not present

## 2016-09-05 DIAGNOSIS — M25661 Stiffness of right knee, not elsewhere classified: Secondary | ICD-10-CM | POA: Diagnosis not present

## 2016-09-05 DIAGNOSIS — R262 Difficulty in walking, not elsewhere classified: Secondary | ICD-10-CM | POA: Diagnosis not present

## 2016-09-07 DIAGNOSIS — M6281 Muscle weakness (generalized): Secondary | ICD-10-CM | POA: Diagnosis not present

## 2016-09-07 DIAGNOSIS — M25661 Stiffness of right knee, not elsewhere classified: Secondary | ICD-10-CM | POA: Diagnosis not present

## 2016-09-07 DIAGNOSIS — R262 Difficulty in walking, not elsewhere classified: Secondary | ICD-10-CM | POA: Diagnosis not present

## 2016-09-07 DIAGNOSIS — M25561 Pain in right knee: Secondary | ICD-10-CM | POA: Diagnosis not present

## 2016-09-11 DIAGNOSIS — M25561 Pain in right knee: Secondary | ICD-10-CM | POA: Diagnosis not present

## 2016-09-20 DIAGNOSIS — M25561 Pain in right knee: Secondary | ICD-10-CM | POA: Diagnosis not present

## 2016-09-20 DIAGNOSIS — R262 Difficulty in walking, not elsewhere classified: Secondary | ICD-10-CM | POA: Diagnosis not present

## 2016-09-20 DIAGNOSIS — M25661 Stiffness of right knee, not elsewhere classified: Secondary | ICD-10-CM | POA: Diagnosis not present

## 2016-09-20 DIAGNOSIS — M6281 Muscle weakness (generalized): Secondary | ICD-10-CM | POA: Diagnosis not present

## 2016-10-02 ENCOUNTER — Encounter: Payer: Self-pay | Admitting: Family Medicine

## 2016-10-02 DIAGNOSIS — N6012 Diffuse cystic mastopathy of left breast: Secondary | ICD-10-CM | POA: Diagnosis not present

## 2016-10-02 LAB — HM MAMMOGRAPHY

## 2016-10-15 ENCOUNTER — Other Ambulatory Visit: Payer: Self-pay | Admitting: Radiology

## 2016-10-15 DIAGNOSIS — N6012 Diffuse cystic mastopathy of left breast: Secondary | ICD-10-CM | POA: Diagnosis not present

## 2016-10-15 LAB — HM MAMMOGRAPHY

## 2016-10-23 DIAGNOSIS — M25561 Pain in right knee: Secondary | ICD-10-CM | POA: Diagnosis not present

## 2016-10-31 DIAGNOSIS — M6281 Muscle weakness (generalized): Secondary | ICD-10-CM | POA: Diagnosis not present

## 2016-10-31 DIAGNOSIS — M25561 Pain in right knee: Secondary | ICD-10-CM | POA: Diagnosis not present

## 2016-10-31 DIAGNOSIS — R262 Difficulty in walking, not elsewhere classified: Secondary | ICD-10-CM | POA: Diagnosis not present

## 2016-10-31 DIAGNOSIS — M25661 Stiffness of right knee, not elsewhere classified: Secondary | ICD-10-CM | POA: Diagnosis not present

## 2016-11-05 DIAGNOSIS — M6281 Muscle weakness (generalized): Secondary | ICD-10-CM | POA: Diagnosis not present

## 2016-11-05 DIAGNOSIS — M25561 Pain in right knee: Secondary | ICD-10-CM | POA: Diagnosis not present

## 2016-11-05 DIAGNOSIS — R262 Difficulty in walking, not elsewhere classified: Secondary | ICD-10-CM | POA: Diagnosis not present

## 2016-11-05 DIAGNOSIS — M25661 Stiffness of right knee, not elsewhere classified: Secondary | ICD-10-CM | POA: Diagnosis not present

## 2016-11-07 DIAGNOSIS — M25561 Pain in right knee: Secondary | ICD-10-CM | POA: Diagnosis not present

## 2016-11-07 DIAGNOSIS — R262 Difficulty in walking, not elsewhere classified: Secondary | ICD-10-CM | POA: Diagnosis not present

## 2016-11-07 DIAGNOSIS — M6281 Muscle weakness (generalized): Secondary | ICD-10-CM | POA: Diagnosis not present

## 2016-11-07 DIAGNOSIS — M25661 Stiffness of right knee, not elsewhere classified: Secondary | ICD-10-CM | POA: Diagnosis not present

## 2016-11-08 DIAGNOSIS — H26493 Other secondary cataract, bilateral: Secondary | ICD-10-CM | POA: Diagnosis not present

## 2016-11-08 DIAGNOSIS — H524 Presbyopia: Secondary | ICD-10-CM | POA: Diagnosis not present

## 2016-11-09 DIAGNOSIS — M25661 Stiffness of right knee, not elsewhere classified: Secondary | ICD-10-CM | POA: Diagnosis not present

## 2016-11-09 DIAGNOSIS — M25561 Pain in right knee: Secondary | ICD-10-CM | POA: Diagnosis not present

## 2016-11-09 DIAGNOSIS — M6281 Muscle weakness (generalized): Secondary | ICD-10-CM | POA: Diagnosis not present

## 2016-11-09 DIAGNOSIS — R262 Difficulty in walking, not elsewhere classified: Secondary | ICD-10-CM | POA: Diagnosis not present

## 2016-11-13 DIAGNOSIS — M25561 Pain in right knee: Secondary | ICD-10-CM | POA: Diagnosis not present

## 2016-11-13 DIAGNOSIS — R262 Difficulty in walking, not elsewhere classified: Secondary | ICD-10-CM | POA: Diagnosis not present

## 2016-11-13 DIAGNOSIS — M25661 Stiffness of right knee, not elsewhere classified: Secondary | ICD-10-CM | POA: Diagnosis not present

## 2016-11-13 DIAGNOSIS — M6281 Muscle weakness (generalized): Secondary | ICD-10-CM | POA: Diagnosis not present

## 2016-11-14 DIAGNOSIS — M25661 Stiffness of right knee, not elsewhere classified: Secondary | ICD-10-CM | POA: Diagnosis not present

## 2016-11-14 DIAGNOSIS — R262 Difficulty in walking, not elsewhere classified: Secondary | ICD-10-CM | POA: Diagnosis not present

## 2016-11-14 DIAGNOSIS — M25561 Pain in right knee: Secondary | ICD-10-CM | POA: Diagnosis not present

## 2016-11-14 DIAGNOSIS — M6281 Muscle weakness (generalized): Secondary | ICD-10-CM | POA: Diagnosis not present

## 2016-11-16 DIAGNOSIS — M6281 Muscle weakness (generalized): Secondary | ICD-10-CM | POA: Diagnosis not present

## 2016-11-16 DIAGNOSIS — M25561 Pain in right knee: Secondary | ICD-10-CM | POA: Diagnosis not present

## 2016-11-16 DIAGNOSIS — R262 Difficulty in walking, not elsewhere classified: Secondary | ICD-10-CM | POA: Diagnosis not present

## 2016-11-16 DIAGNOSIS — M25661 Stiffness of right knee, not elsewhere classified: Secondary | ICD-10-CM | POA: Diagnosis not present

## 2016-11-19 DIAGNOSIS — M25561 Pain in right knee: Secondary | ICD-10-CM | POA: Diagnosis not present

## 2016-11-19 DIAGNOSIS — R262 Difficulty in walking, not elsewhere classified: Secondary | ICD-10-CM | POA: Diagnosis not present

## 2016-11-19 DIAGNOSIS — M6281 Muscle weakness (generalized): Secondary | ICD-10-CM | POA: Diagnosis not present

## 2016-11-19 DIAGNOSIS — M25661 Stiffness of right knee, not elsewhere classified: Secondary | ICD-10-CM | POA: Diagnosis not present

## 2016-11-21 DIAGNOSIS — R262 Difficulty in walking, not elsewhere classified: Secondary | ICD-10-CM | POA: Diagnosis not present

## 2016-11-21 DIAGNOSIS — M6281 Muscle weakness (generalized): Secondary | ICD-10-CM | POA: Diagnosis not present

## 2016-11-21 DIAGNOSIS — M25661 Stiffness of right knee, not elsewhere classified: Secondary | ICD-10-CM | POA: Diagnosis not present

## 2016-11-21 DIAGNOSIS — M25561 Pain in right knee: Secondary | ICD-10-CM | POA: Diagnosis not present

## 2016-11-22 ENCOUNTER — Other Ambulatory Visit: Payer: Self-pay | Admitting: Family Medicine

## 2016-11-22 DIAGNOSIS — F32A Depression, unspecified: Secondary | ICD-10-CM

## 2016-11-22 DIAGNOSIS — F329 Major depressive disorder, single episode, unspecified: Secondary | ICD-10-CM

## 2016-11-23 DIAGNOSIS — R262 Difficulty in walking, not elsewhere classified: Secondary | ICD-10-CM | POA: Diagnosis not present

## 2016-11-23 DIAGNOSIS — M6281 Muscle weakness (generalized): Secondary | ICD-10-CM | POA: Diagnosis not present

## 2016-11-23 DIAGNOSIS — M25561 Pain in right knee: Secondary | ICD-10-CM | POA: Diagnosis not present

## 2016-11-23 DIAGNOSIS — M25661 Stiffness of right knee, not elsewhere classified: Secondary | ICD-10-CM | POA: Diagnosis not present

## 2016-11-25 ENCOUNTER — Other Ambulatory Visit: Payer: Self-pay

## 2016-11-25 NOTE — Telephone Encounter (Signed)
Denied Prozac. Pt on Clonazepam per 08/2016 visit note

## 2016-11-26 DIAGNOSIS — R262 Difficulty in walking, not elsewhere classified: Secondary | ICD-10-CM | POA: Diagnosis not present

## 2016-11-26 DIAGNOSIS — M6281 Muscle weakness (generalized): Secondary | ICD-10-CM | POA: Diagnosis not present

## 2016-11-26 DIAGNOSIS — M25561 Pain in right knee: Secondary | ICD-10-CM | POA: Diagnosis not present

## 2016-11-26 DIAGNOSIS — M25661 Stiffness of right knee, not elsewhere classified: Secondary | ICD-10-CM | POA: Diagnosis not present

## 2016-11-27 DIAGNOSIS — M25561 Pain in right knee: Secondary | ICD-10-CM | POA: Diagnosis not present

## 2016-11-28 DIAGNOSIS — M25561 Pain in right knee: Secondary | ICD-10-CM | POA: Diagnosis not present

## 2016-11-28 DIAGNOSIS — M6281 Muscle weakness (generalized): Secondary | ICD-10-CM | POA: Diagnosis not present

## 2016-11-28 DIAGNOSIS — M25661 Stiffness of right knee, not elsewhere classified: Secondary | ICD-10-CM | POA: Diagnosis not present

## 2016-11-28 DIAGNOSIS — R262 Difficulty in walking, not elsewhere classified: Secondary | ICD-10-CM | POA: Diagnosis not present

## 2016-11-30 DIAGNOSIS — M25661 Stiffness of right knee, not elsewhere classified: Secondary | ICD-10-CM | POA: Diagnosis not present

## 2016-11-30 DIAGNOSIS — M25561 Pain in right knee: Secondary | ICD-10-CM | POA: Diagnosis not present

## 2016-11-30 DIAGNOSIS — M6281 Muscle weakness (generalized): Secondary | ICD-10-CM | POA: Diagnosis not present

## 2016-11-30 DIAGNOSIS — R262 Difficulty in walking, not elsewhere classified: Secondary | ICD-10-CM | POA: Diagnosis not present

## 2016-12-07 DIAGNOSIS — M25561 Pain in right knee: Secondary | ICD-10-CM | POA: Diagnosis not present

## 2016-12-07 DIAGNOSIS — R262 Difficulty in walking, not elsewhere classified: Secondary | ICD-10-CM | POA: Diagnosis not present

## 2016-12-07 DIAGNOSIS — M25661 Stiffness of right knee, not elsewhere classified: Secondary | ICD-10-CM | POA: Diagnosis not present

## 2016-12-07 DIAGNOSIS — M6281 Muscle weakness (generalized): Secondary | ICD-10-CM | POA: Diagnosis not present

## 2016-12-10 DIAGNOSIS — R262 Difficulty in walking, not elsewhere classified: Secondary | ICD-10-CM | POA: Diagnosis not present

## 2016-12-10 DIAGNOSIS — M6281 Muscle weakness (generalized): Secondary | ICD-10-CM | POA: Diagnosis not present

## 2016-12-10 DIAGNOSIS — M25561 Pain in right knee: Secondary | ICD-10-CM | POA: Diagnosis not present

## 2016-12-10 DIAGNOSIS — M25661 Stiffness of right knee, not elsewhere classified: Secondary | ICD-10-CM | POA: Diagnosis not present

## 2016-12-11 ENCOUNTER — Other Ambulatory Visit: Payer: Self-pay | Admitting: Family Medicine

## 2016-12-11 DIAGNOSIS — F32A Depression, unspecified: Secondary | ICD-10-CM

## 2016-12-11 DIAGNOSIS — F329 Major depressive disorder, single episode, unspecified: Secondary | ICD-10-CM

## 2016-12-12 ENCOUNTER — Other Ambulatory Visit: Payer: Self-pay | Admitting: Family Medicine

## 2016-12-12 DIAGNOSIS — M25561 Pain in right knee: Secondary | ICD-10-CM | POA: Diagnosis not present

## 2016-12-12 DIAGNOSIS — M25661 Stiffness of right knee, not elsewhere classified: Secondary | ICD-10-CM | POA: Diagnosis not present

## 2016-12-12 DIAGNOSIS — R262 Difficulty in walking, not elsewhere classified: Secondary | ICD-10-CM | POA: Diagnosis not present

## 2016-12-12 DIAGNOSIS — M6281 Muscle weakness (generalized): Secondary | ICD-10-CM | POA: Diagnosis not present

## 2016-12-14 ENCOUNTER — Other Ambulatory Visit: Payer: Self-pay

## 2016-12-14 DIAGNOSIS — M25561 Pain in right knee: Secondary | ICD-10-CM | POA: Diagnosis not present

## 2016-12-14 DIAGNOSIS — M25661 Stiffness of right knee, not elsewhere classified: Secondary | ICD-10-CM | POA: Diagnosis not present

## 2016-12-14 DIAGNOSIS — M6281 Muscle weakness (generalized): Secondary | ICD-10-CM | POA: Diagnosis not present

## 2016-12-14 DIAGNOSIS — R262 Difficulty in walking, not elsewhere classified: Secondary | ICD-10-CM | POA: Diagnosis not present

## 2016-12-14 NOTE — Telephone Encounter (Signed)
Pt requesting refill on prozac. Please advise. Appt scheduled for 01/03/17

## 2016-12-17 ENCOUNTER — Other Ambulatory Visit: Payer: Self-pay | Admitting: Family Medicine

## 2016-12-17 DIAGNOSIS — R262 Difficulty in walking, not elsewhere classified: Secondary | ICD-10-CM | POA: Diagnosis not present

## 2016-12-17 DIAGNOSIS — M6281 Muscle weakness (generalized): Secondary | ICD-10-CM | POA: Diagnosis not present

## 2016-12-17 DIAGNOSIS — M25661 Stiffness of right knee, not elsewhere classified: Secondary | ICD-10-CM | POA: Diagnosis not present

## 2016-12-17 DIAGNOSIS — M25561 Pain in right knee: Secondary | ICD-10-CM | POA: Diagnosis not present

## 2016-12-19 DIAGNOSIS — M25661 Stiffness of right knee, not elsewhere classified: Secondary | ICD-10-CM | POA: Diagnosis not present

## 2016-12-19 DIAGNOSIS — R262 Difficulty in walking, not elsewhere classified: Secondary | ICD-10-CM | POA: Diagnosis not present

## 2016-12-19 DIAGNOSIS — M25561 Pain in right knee: Secondary | ICD-10-CM | POA: Diagnosis not present

## 2016-12-19 DIAGNOSIS — M6281 Muscle weakness (generalized): Secondary | ICD-10-CM | POA: Diagnosis not present

## 2016-12-20 NOTE — Telephone Encounter (Signed)
Refilled as has appt scheduled 01/03/17

## 2016-12-21 DIAGNOSIS — R262 Difficulty in walking, not elsewhere classified: Secondary | ICD-10-CM | POA: Diagnosis not present

## 2016-12-21 DIAGNOSIS — M25661 Stiffness of right knee, not elsewhere classified: Secondary | ICD-10-CM | POA: Diagnosis not present

## 2016-12-21 DIAGNOSIS — M6281 Muscle weakness (generalized): Secondary | ICD-10-CM | POA: Diagnosis not present

## 2016-12-21 DIAGNOSIS — M25561 Pain in right knee: Secondary | ICD-10-CM | POA: Diagnosis not present

## 2016-12-25 DIAGNOSIS — M25561 Pain in right knee: Secondary | ICD-10-CM | POA: Diagnosis not present

## 2016-12-31 DIAGNOSIS — M25561 Pain in right knee: Secondary | ICD-10-CM | POA: Diagnosis not present

## 2016-12-31 DIAGNOSIS — M1711 Unilateral primary osteoarthritis, right knee: Secondary | ICD-10-CM | POA: Diagnosis not present

## 2016-12-31 DIAGNOSIS — M25661 Stiffness of right knee, not elsewhere classified: Secondary | ICD-10-CM | POA: Diagnosis not present

## 2016-12-31 DIAGNOSIS — Z471 Aftercare following joint replacement surgery: Secondary | ICD-10-CM | POA: Diagnosis not present

## 2017-01-03 ENCOUNTER — Ambulatory Visit: Payer: PPO | Admitting: Family Medicine

## 2017-01-10 ENCOUNTER — Encounter: Payer: Self-pay | Admitting: Family Medicine

## 2017-01-10 ENCOUNTER — Ambulatory Visit (INDEPENDENT_AMBULATORY_CARE_PROVIDER_SITE_OTHER): Payer: PPO | Admitting: Family Medicine

## 2017-01-10 VITALS — BP 139/81 | HR 89 | Temp 97.9°F | Resp 16 | Ht 65.0 in | Wt 180.0 lb

## 2017-01-10 DIAGNOSIS — R0789 Other chest pain: Secondary | ICD-10-CM

## 2017-01-10 DIAGNOSIS — G4700 Insomnia, unspecified: Secondary | ICD-10-CM | POA: Diagnosis not present

## 2017-01-10 DIAGNOSIS — F418 Other specified anxiety disorders: Secondary | ICD-10-CM

## 2017-01-10 DIAGNOSIS — Z8249 Family history of ischemic heart disease and other diseases of the circulatory system: Secondary | ICD-10-CM

## 2017-01-10 MED ORDER — SUVOREXANT 10 MG PO TABS: 10.0000 mg | ORAL_TABLET | Freq: Every day | ORAL | 2 refills | Status: DC

## 2017-01-10 MED ORDER — FLUOXETINE HCL 20 MG PO TABS: 40.0000 mg | ORAL_TABLET | Freq: Every day | ORAL | 1 refills | Status: DC

## 2017-01-10 MED ORDER — CLONAZEPAM 0.5 MG PO TABS: 0.2500 mg | ORAL_TABLET | Freq: Two times a day (BID) | ORAL | 1 refills | Status: DC | PRN

## 2017-01-10 NOTE — Patient Instructions (Addendum)
Aspirin once per day, call Dr. Einar Gip for appointment to discuss chest tightness, as you may need that other stress test at this point (especially with your family history). Return to the clinic or go to the nearest emergency room if any of your symptoms worsen or new symptoms occur.  Continue prozac same dose, klonopin if needed, try Belsomra at bedtime. There may be a coupon online. If not improving anxiety in next 4 weeks, return to discuss other options.   Insomnia Insomnia is a sleep disorder that makes it difficult to fall asleep or to stay asleep. Insomnia can cause tiredness (fatigue), low energy, difficulty concentrating, mood swings, and poor performance at work or school. There are three different ways to classify insomnia:  Difficulty falling asleep.  Difficulty staying asleep.  Waking up too early in the morning.  Any type of insomnia can be long-term (chronic) or short-term (acute). Both are common. Short-term insomnia usually lasts for three months or less. Chronic insomnia occurs at least three times a week for longer than three months. What are the causes? Insomnia may be caused by another condition, situation, or substance, such as:  Anxiety.  Certain medicines.  Gastroesophageal reflux disease (GERD) or other gastrointestinal conditions.  Asthma or other breathing conditions.  Restless legs syndrome, sleep apnea, or other sleep disorders.  Chronic pain.  Menopause. This may include hot flashes.  Stroke.  Abuse of alcohol, tobacco, or illegal drugs.  Depression.  Caffeine.  Neurological disorders, such as Alzheimer disease.  An overactive thyroid (hyperthyroidism).  The cause of insomnia may not be known. What increases the risk? Risk factors for insomnia include:  Gender. Women are more commonly affected than men.  Age. Insomnia is more common as you get older.  Stress. This may involve your professional or personal life.  Income. Insomnia is  more common in people with lower income.  Lack of exercise.  Irregular work schedule or night shifts.  Traveling between different time zones.  What are the signs or symptoms? If you have insomnia, trouble falling asleep or trouble staying asleep is the main symptom. This may lead to other symptoms, such as:  Feeling fatigued.  Feeling nervous about going to sleep.  Not feeling rested in the morning.  Having trouble concentrating.  Feeling irritable, anxious, or depressed.  How is this treated? Treatment for insomnia depends on the cause. If your insomnia is caused by an underlying condition, treatment will focus on addressing the condition. Treatment may also include:  Medicines to help you sleep.  Counseling or therapy.  Lifestyle adjustments.  Follow these instructions at home:  Take medicines only as directed by your health care provider.  Keep regular sleeping and waking hours. Avoid naps.  Keep a sleep diary to help you and your health care provider figure out what could be causing your insomnia. Include: ? When you sleep. ? When you wake up during the night. ? How well you sleep. ? How rested you feel the next day. ? Any side effects of medicines you are taking. ? What you eat and drink.  Make your bedroom a comfortable place where it is easy to fall asleep: ? Put up shades or special blackout curtains to block light from outside. ? Use a white noise machine to block noise. ? Keep the temperature cool.  Exercise regularly as directed by your health care provider. Avoid exercising right before bedtime.  Use relaxation techniques to manage stress. Ask your health care provider to suggest some techniques  that may work well for you. These may include: ? Breathing exercises. ? Routines to release muscle tension. ? Visualizing peaceful scenes.  Cut back on alcohol, caffeinated beverages, and cigarettes, especially close to bedtime. These can disrupt your  sleep.  Do not overeat or eat spicy foods right before bedtime. This can lead to digestive discomfort that can make it hard for you to sleep.  Limit screen use before bedtime. This includes: ? Watching TV. ? Using your smartphone, tablet, and computer.  Stick to a routine. This can help you fall asleep faster. Try to do a quiet activity, brush your teeth, and go to bed at the same time each night.  Get out of bed if you are still awake after 15 minutes of trying to sleep. Keep the lights down, but try reading or doing a quiet activity. When you feel sleepy, go back to bed.  Make sure that you drive carefully. Avoid driving if you feel very sleepy.  Keep all follow-up appointments as directed by your health care provider. This is important. Contact a health care provider if:  You are tired throughout the day or have trouble in your daily routine due to sleepiness.  You continue to have sleep problems or your sleep problems get worse. Get help right away if:  You have serious thoughts about hurting yourself or someone else. This information is not intended to replace advice given to you by your health care provider. Make sure you discuss any questions you have with your health care provider. Document Released: 05/18/2000 Document Revised: 10/21/2015 Document Reviewed: 02/19/2014 Elsevier Interactive Patient Education  2018 [employer].  Nonspecific Chest Pain Chest pain can be caused by many different conditions. There is always a chance that your pain could be related to something serious, such as a heart attack or a blood clot in your lungs. Chest pain can also be caused by conditions that are not life-threatening. If you have chest pain, it is very important to follow up with your health care provider. What are the causes? Causes of this condition include:  Heartburn.  Pneumonia or bronchitis.  Anxiety or stress.  Inflammation around your heart (pericarditis) or lung  (pleuritis or pleurisy).  A blood clot in your lung.  A collapsed lung (pneumothorax). This can develop suddenly on its own (spontaneous pneumothorax) or from trauma to the chest.  Shingles infection (varicella-zoster virus).  Heart attack.  Damage to the bones, muscles, and cartilage that make up your chest wall. This can include: ? Bruised bones due to injury. ? Strained muscles or cartilage due to frequent or repeated coughing or overwork. ? Fracture to one or more ribs. ? Sore cartilage due to inflammation (costochondritis).  What increases the risk? Risk factors for this condition may include:  Activities that increase your risk for trauma or injury to your chest.  Respiratory infections or conditions that cause frequent coughing.  Medical conditions or overeating that can cause heartburn.  Heart disease or family history of heart disease.  Conditions or health behaviors that increase your risk of developing a blood clot.  Having had chicken pox (varicella zoster).  What are the signs or symptoms? Chest pain can feel like:  Burning or tingling on the surface of your chest or deep in your chest.  Crushing, pressure, aching, or squeezing pain.  Dull or sharp pain that is worse when you move, cough, or take a deep breath.  Pain that is also felt in your back, neck, shoulder, or  arm, or pain that spreads to any of these areas.  Your chest pain may come and go, or it may stay constant. How is this diagnosed? Lab tests or other studies may be needed to find the cause of your pain. Your health care provider may have you take a test called an ECG (electrocardiogram). An ECG records your heartbeat patterns at the time the test is performed. You may also have other tests, such as:  Transthoracic echocardiogram (TTE). In this test, sound waves are used to create a picture of the heart structures and to look at how blood flows through your heart.  Transesophageal  echocardiogram (TEE).This is a more advanced imaging test that takes images from inside your body. It allows your health care provider to see your heart in finer detail.  Cardiac monitoring. This allows your health care provider to monitor your heart rate and rhythm in real time.  Holter monitor. This is a portable device that records your heartbeat and can help to diagnose abnormal heartbeats. It allows your health care provider to track your heart activity for several days, if needed.  Stress tests. These can be done through exercise or by taking medicine that makes your heart beat more quickly.  Blood tests.  Other imaging tests.  How is this treated? Treatment depends on what is causing your chest pain. Treatment may include:  Medicines. These may include: ? Acid blockers for heartburn. ? Anti-inflammatory medicine. ? Pain medicine for inflammatory conditions. ? Antibiotic medicine, if an infection is present. ? Medicines to dissolve blood clots. ? Medicines to treat coronary artery disease (CAD).  Supportive care for conditions that do not require medicines. This may include: ? Resting. ? Applying heat or cold packs to injured areas. ? Limiting activities until pain decreases.  Follow these instructions at home: Medicines  If you were prescribed an antibiotic, take it as told by your health care provider. Do not stop taking the antibiotic even if you start to feel better.  Take over-the-counter and prescription medicines only as told by your health care provider. Lifestyle  Do not use any products that contain nicotine or tobacco, such as cigarettes and e-cigarettes. If you need help quitting, ask your health care provider.  Do not drink alcohol.  Make lifestyle changes as directed by your health care provider. These may include: ? Getting regular exercise. Ask your health care provider to suggest some activities that are safe for you. ? Eating a heart-healthy diet. A  registered dietitian can help you to learn healthy eating options. ? Maintaining a healthy weight. ? Managing diabetes, if necessary. ? Reducing stress, such as with yoga or relaxation techniques. General instructions  Avoid any activities that bring on chest pain.  If heartburn is the cause for your chest pain, raise (elevate) the head of your bed about 6 inches (15 cm) by putting blocks under the legs. Sleeping with more pillows does not effectively relieve heartburn because it only changes the position of your head.  Keep all follow-up visits as told by your health care provider. This is important. This includes any further testing if your chest pain does not go away. Contact a health care provider if:  Your chest pain does not go away.  You have a rash with blisters on your chest.  You have a fever.  You have chills. Get help right away if:  Your chest pain is worse.  You have a cough that gets worse, or you cough up blood.  You have severe pain in your abdomen.  You have severe weakness.  You faint.  You have sudden, unexplained chest discomfort.  You have sudden, unexplained discomfort in your arms, back, neck, or jaw.  You have shortness of breath at any time.  You suddenly start to sweat, or your skin gets clammy.  You feel nauseous or you vomit.  You suddenly feel light-headed or dizzy.  Your heart begins to beat quickly, or it feels like it is skipping beats. These symptoms may represent a serious problem that is an emergency. Do not wait to see if the symptoms will go away. Get medical help right away. Call your local emergency services (911 in the [place] Do not drive yourself to the hospital. This information is not intended to replace advice given to you by your health care provider. Make sure you discuss any questions you have with your health care provider. Document Released: 02/28/2005 Document Revised: 02/13/2016 Document Reviewed: 02/13/2016 Elsevier  Interactive Patient Education  2017 [employer].     IF you received an x-ray today, you will receive an invoice from [facility]. Please contact [facility] at [phone] with questions or concerns regarding your invoice.   IF you received labwork today, you will receive an invoice from [facility]. Please contact [facility] at [phone] with questions or concerns regarding your invoice.   Our billing staff will not be able to assist you with questions regarding bills from these companies.  You will be contacted with the lab results as soon as they are available. The fastest way to get your results is to activate your My Chart account. Instructions are located on the last page of this paperwork. If you have not heard from [place] regarding the results in 2 weeks, please contact this office.

## 2017-01-10 NOTE — Progress Notes (Signed)
By signing my name below, I, Mesha Guinyard, attest that this documentation has been prepared under the direction and in the presence of [name], MD.  Electronically Signed: Verlee Monte, Medical Scribe. 01/10/17. 10:19 AM.  Subjective:    Patient ID: Zoe Kim, female    DOB: [birth year], 71 y.o.   MRN: [id number]  HPI Chief Complaint  Patient presents with  . Follow-up    right knee surgery  . Depression    14 depression screening    HPI Comments: Zoe Kim is a 71 y.o. female who presents to Primary Care at [facility] for follow-up.  Knee Surgery: She recently underwent total right knee arthoplasty surgery with Dr. Noemi Chapel on Feb 26th. Pt still has pain and scar tissue. She's been in PT with Lytle Michaels -she can't straighten her leg or bend it enough to dance. Pt has been horseback riding since her surgey.  Anxiety: I last saw her March 27th. We discussed increasing prozac in the past. Had discussed trintellix. We decided to increase prozac. Klonopin for anxiety PRN Pt has been taking prozac 40 mg since her last visit (5 months) and hasn't noticed any change in her anxiety. Pt still feels anxious, and loss of concentration. Report she can't keep anything in her brain longer than 5 seconds and she's stressed from work due to financial loss - might improve but it's not there yet. Pt has not used klonopin. Denies SI, thoughts of hurting herself, experiencing side effects from prozac when taken in the past.  Depression screen [facility] 2/9 01/10/2017 08/28/2016 07/05/2016 12/08/2015 09/14/2015  Decreased Interest 1 0 0 0 2  Down, Depressed, Hopeless 3 0 0 1 1  PHQ - 2 Score 4 0 0 1 3  Altered sleeping 3 - - - 3  Tired, decreased energy 3 - - - 3  Change in appetite 1 - - - 3  Feeling bad or failure about yourself  3 - - - -  Trouble concentrating 0 - - - 0  Moving slowly or fidgety/restless 0 - - - 0  Suicidal thoughts 0 - - - 0  PHQ-9 Score 14 - - - 12  Difficult doing  work/chores Very difficult - - - Somewhat difficult   Sleep Disturbance: Pt has been having poor sleep due to early wakening. Pt occasionally wakes up around 2am and goes back to bed - suspects it's due to her anxiety.   Elevated BP: Followed by Dr Einar Gip, last saw him in Feb. In 2014 she had a echocardiogram stress test - negative for ischemia, and decrease exercise tolerance. There was a discussion of nuclear stress test, but as she was asymptomatic so this was deferred. Reports higher bp than usual. She recalls chest pain and chest tightness that occurs for 20 min to an hour when she's sitting at home watching TV or messing with her dogs- no heartburn. Pt does not take ASA daily. FHx: Brother had 4 heart attacks between the ages of 46-40 y/o. Dad passed from heart failure. Maternal grandfather had heart disease  BP Readings from Last 3 Encounters:  01/10/17 139/81  08/28/16 (!) 161/78  08/01/16 (!) 143/67   Patient Active Problem List   Diagnosis Date Noted  . Primary localized osteoarthritis of right knee 07/30/2016  . Anxiety   . Primary localized osteoarthrosis of the knee, right   . Urinary urgency    Past Medical History:  Diagnosis Date  . Anxiety   . Arthritis   . Depression   .  Heart murmur    "years ago"  . Insomnia   . Pneumonia    "long time ago"  . Primary localized osteoarthrosis of the knee, right   . Urinary urgency    Past Surgical History:  Procedure Laterality Date  . COLONOSCOPY    . EYE SURGERY Bilateral    cataract with lens  . TONSILLECTOMY    . TOTAL KNEE ARTHROPLASTY Right 07/30/2016   Procedure: TOTAL KNEE ARTHROPLASTY;  Surgeon: Elsie Saas, MD;  Location: [facility];  Service: Orthopedics;  Laterality: Right;  . VEIN LIGATION AND STRIPPING     Allergies  Allergen Reactions  . Sulfa Antibiotics     UNSPECIFIED REACTION    Prior to Admission medications   Medication Sig Start Date End Date Taking? Authorizing Provider  FLUoxetine (PROZAC) 20 MG  tablet TAKE 1 TABLET BY MOUTH EVERY DAY 12/20/16  Yes [name], MD  MELATONIN PO Take 1 tablet by mouth at bedtime as needed (sleep).   Yes [provider]  oxybutynin (DITROPAN) 5 MG tablet Take 0.5-1 tablets (2.5-5 mg total) by mouth 3 (three) times daily. 07/05/16  Yes [name], MD  acetaminophen (TYLENOL) 500 MG tablet Take 1,000 mg by mouth every 6 (six) hours as needed for moderate pain.    [provider]  aspirin EC 325 MG tablet 1 tab twice a day for the next 30 days to prevent blood clots Patient not taking: Reported on 01/10/2017 08/01/16   Shepperson, Kirstin, PA-C  clonazePAM (KLONOPIN) 0.5 MG tablet Take 0.5-1 tablets (0.25-0.5 mg total) by mouth 2 (two) times daily as needed for anxiety. Patient not taking: Reported on 01/10/2017 08/28/16   [name], MD  docusate sodium (COLACE) 100 MG capsule 1 tab 2 times a day while on narcotics.  STOOL SOFTENER Patient not taking: Reported on 01/10/2017 08/01/16   Matthew Saras, PA-C  oxyCODONE (OXY IR/ROXICODONE) 5 MG immediate release tablet 1-2 tablets every 4-6 hrs as needed for pain Patient not taking: Reported on 08/28/2016 08/01/16   Shepperson, Kirstin, PA-C  polyethylene glycol (MIRALAX / GLYCOLAX) packet 17grams in 6 oz of water twice a day until bowel movement.  LAXITIVE.  Restart if two days since last bowel movement Patient not taking: Reported on 01/10/2017 08/01/16   Matthew Saras, PA-C   Social History   Social History  . Marital status: Married    Spouse name: N/A  . Number of children: N/A  . Years of education: N/A   Occupational History  . Not on file.   Social History Main Topics  . Smoking status: Never Smoker  . Smokeless tobacco: Never Used     Comment: smoked 2 weeks in college  . Alcohol use No  . Drug use: No  . Sexual activity: Yes   Other Topics Concern  . Not on file   Social History Narrative  . No narrative on file   Review of Systems  Respiratory:  Positive for chest tightness.   Cardiovascular: Positive for chest pain.  Musculoskeletal: Positive for arthralgias.  Psychiatric/Behavioral: Positive for decreased concentration and sleep disturbance. Negative for self-injury and suicidal ideas. The patient is nervous/anxious.    Objective:  Physical Exam  Constitutional: She is oriented to person, place, and time. She appears well-developed and well-nourished. No distress.  HENT:  Head: Normocephalic and atraumatic.  Eyes: Pupils are equal, round, and reactive to light. Conjunctivae and EOM are normal.  Neck: Neck supple. Carotid bruit is not present.  Cardiovascular:  Normal rate, regular rhythm, normal heart sounds and intact distal pulses.  Exam reveals no gallop and no friction rub.   No murmur heard. Pulmonary/Chest: Effort normal and breath sounds normal. No respiratory distress. She has no wheezes. She has no rales.  Abdominal: Soft. She exhibits no pulsatile midline mass. There is no tenderness.  Neurological: She is alert and oriented to person, place, and time.  Skin: Skin is warm and dry.  Psychiatric: She has a normal mood and affect. Her behavior is normal.  Nursing note and vitals reviewed.   Vitals:   01/10/17 0951  BP: 139/81  Pulse: 89  Resp: 16  Temp: 97.9 F (36.6 C)  TempSrc: Oral  SpO2: 98%  Weight: 180 lb (81.6 kg)  Height: 5\' 5"  (1.651 m)  Body mass index is 29.95 kg/m.   [EKG Reading]: Sinus rhythm. No acute findings. Assessment & Plan:    KAPRICE KAGE is a 71 y.o. female Insomnia, unspecified type - Plan: Suvorexant ([place]) 10 MG TABS Depression with anxiety - Plan: FLUoxetine (PROZAC) 20 MG tablet Situational anxiety - Plan: clonazePAM (KLONOPIN) 0.5 MG tablet, FLUoxetine (PROZAC) 20 MG tablet  - Suspected depression with anxiety, increased situational/financial stressors with work. Options discussed on medication, initially will try different sleep medication, Bill Sohmer, Klonopin as  needed for situational stressor flares, then if not improving in the next 4-6 weeks, would consider change in SSRI. For now continue Prozac 40 mg daily.  Chest tightness - Plan: EKG 12-Lead Family history of early CAD - Plan: EKG 12-Lead  - No concerning findings on EKG, but with her family history of early cardiac disease, and recent symptoms of chest tightness, will have her follow-up with cardiology as other stress testing was discussed previously. ER/911 chest pain precautions reviewed.  Meds ordered this encounter  Medications  . Suvorexant (BELSOMRA) 10 MG TABS    Sig: Take 10 mg by mouth at bedtime.    Dispense:  30 tablet    Refill:  2  . clonazePAM (KLONOPIN) 0.5 MG tablet    Sig: Take 0.5-1 tablets (0.25-0.5 mg total) by mouth 2 (two) times daily as needed for anxiety.    Dispense:  20 tablet    Refill:  1  . FLUoxetine (PROZAC) 20 MG tablet    Sig: Take 2 tablets (40 mg total) by mouth daily.    Dispense:  180 tablet    Refill:  1   Patient Instructions   Aspirin once per day, call Dr. Einar Gip for appointment to discuss chest tightness, as you may need that other stress test at this point (especially with your family history). Return to the clinic or go to the nearest emergency room if any of your symptoms worsen or new symptoms occur.  Continue prozac same dose, klonopin if needed, try Belsomra at bedtime. There may be a coupon online. If not improving anxiety in next 4 weeks, return to discuss other options.   Insomnia Insomnia is a sleep disorder that makes it difficult to fall asleep or to stay asleep. Insomnia can cause tiredness (fatigue), low energy, difficulty concentrating, mood swings, and poor performance at work or school. There are three different ways to classify insomnia:  Difficulty falling asleep.  Difficulty staying asleep.  Waking up too early in the morning.  Any type of insomnia can be long-term (chronic) or short-term (acute). Both are common.  Short-term insomnia usually lasts for three months or less. Chronic insomnia occurs at least three times a week for  longer than three months. What are the causes? Insomnia may be caused by another condition, situation, or substance, such as:  Anxiety.  Certain medicines.  Gastroesophageal reflux disease (GERD) or other gastrointestinal conditions.  Asthma or other breathing conditions.  Restless legs syndrome, sleep apnea, or other sleep disorders.  Chronic pain.  Menopause. This may include hot flashes.  Stroke.  Abuse of alcohol, tobacco, or illegal drugs.  Depression.  Caffeine.  Neurological disorders, such as Alzheimer disease.  An overactive thyroid (hyperthyroidism).  The cause of insomnia may not be known. What increases the risk? Risk factors for insomnia include:  Gender. Women are more commonly affected than men.  Age. Insomnia is more common as you get older.  Stress. This may involve your professional or personal life.  Income. Insomnia is more common in people with lower income.  Lack of exercise.  Irregular work schedule or night shifts.  Traveling between different time zones.  What are the signs or symptoms? If you have insomnia, trouble falling asleep or trouble staying asleep is the main symptom. This may lead to other symptoms, such as:  Feeling fatigued.  Feeling nervous about going to sleep.  Not feeling rested in the morning.  Having trouble concentrating.  Feeling irritable, anxious, or depressed.  How is this treated? Treatment for insomnia depends on the cause. If your insomnia is caused by an underlying condition, treatment will focus on addressing the condition. Treatment may also include:  Medicines to help you sleep.  Counseling or therapy.  Lifestyle adjustments.  Follow these instructions at home:  Take medicines only as directed by your health care provider.  Keep regular sleeping and waking hours. Avoid  naps.  Keep a sleep diary to help you and your health care provider figure out what could be causing your insomnia. Include: ? When you sleep. ? When you wake up during the night. ? How well you sleep. ? How rested you feel the next day. ? Any side effects of medicines you are taking. ? What you eat and drink.  Make your bedroom a comfortable place where it is easy to fall asleep: ? Put up shades or special blackout curtains to block light from outside. ? Use a white noise machine to block noise. ? Keep the temperature cool.  Exercise regularly as directed by your health care provider. Avoid exercising right before bedtime.  Use relaxation techniques to manage stress. Ask your health care provider to suggest some techniques that may work well for you. These may include: ? Breathing exercises. ? Routines to release muscle tension. ? Visualizing peaceful scenes.  Cut back on alcohol, caffeinated beverages, and cigarettes, especially close to bedtime. These can disrupt your sleep.  Do not overeat or eat spicy foods right before bedtime. This can lead to digestive discomfort that can make it hard for you to sleep.  Limit screen use before bedtime. This includes: ? Watching TV. ? Using your smartphone, tablet, and computer.  Stick to a routine. This can help you fall asleep faster. Try to do a quiet activity, brush your teeth, and go to bed at the same time each night.  Get out of bed if you are still awake after 15 minutes of trying to sleep. Keep the lights down, but try reading or doing a quiet activity. When you feel sleepy, go back to bed.  Make sure that you drive carefully. Avoid driving if you feel very sleepy.  Keep all follow-up appointments as directed by your health  care provider. This is important. Contact a health care provider if:  You are tired throughout the day or have trouble in your daily routine due to sleepiness.  You continue to have sleep problems or your  sleep problems get worse. Get help right away if:  You have serious thoughts about hurting yourself or someone else. This information is not intended to replace advice given to you by your health care provider. Make sure you discuss any questions you have with your health care provider. Document Released: 05/18/2000 Document Revised: 10/21/2015 Document Reviewed: 02/19/2014 Elsevier Interactive Patient Education  2018 [place].  Nonspecific Chest Pain Chest pain can be caused by many different conditions. There is always a chance that your pain could be related to something serious, such as a heart attack or a blood clot in your lungs. Chest pain can also be caused by conditions that are not life-threatening. If you have chest pain, it is very important to follow up with your health care provider. What are the causes? Causes of this condition include:  Heartburn.  Pneumonia or bronchitis.  Anxiety or stress.  Inflammation around your heart (pericarditis) or lung (pleuritis or pleurisy).  A blood clot in your lung.  A collapsed lung (pneumothorax). This can develop suddenly on its own (spontaneous pneumothorax) or from trauma to the chest.  Shingles infection (varicella-zoster virus).  Heart attack.  Damage to the bones, muscles, and cartilage that make up your chest wall. This can include: ? Bruised bones due to injury. ? Strained muscles or cartilage due to frequent or repeated coughing or overwork. ? Fracture to one or more ribs. ? Sore cartilage due to inflammation (costochondritis).  What increases the risk? Risk factors for this condition may include:  Activities that increase your risk for trauma or injury to your chest.  Respiratory infections or conditions that cause frequent coughing.  Medical conditions or overeating that can cause heartburn.  Heart disease or family history of heart disease.  Conditions or health behaviors that increase your risk of  developing a blood clot.  Having had chicken pox (varicella zoster).  What are the signs or symptoms? Chest pain can feel like:  Burning or tingling on the surface of your chest or deep in your chest.  Crushing, pressure, aching, or squeezing pain.  Dull or sharp pain that is worse when you move, cough, or take a deep breath.  Pain that is also felt in your back, neck, shoulder, or arm, or pain that spreads to any of these areas.  Your chest pain may come and go, or it may stay constant. How is this diagnosed? Lab tests or other studies may be needed to find the cause of your pain. Your health care provider may have you take a test called an ECG (electrocardiogram). An ECG records your heartbeat patterns at the time the test is performed. You may also have other tests, such as:  Transthoracic echocardiogram (TTE). In this test, sound waves are used to create a picture of the heart structures and to look at how blood flows through your heart.  Transesophageal echocardiogram (TEE).This is a more advanced imaging test that takes images from inside your body. It allows your health care provider to see your heart in finer detail.  Cardiac monitoring. This allows your health care provider to monitor your heart rate and rhythm in real time.  Holter monitor. This is a portable device that records your heartbeat and can help to diagnose abnormal heartbeats. It allows  your health care provider to track your heart activity for several days, if needed.  Stress tests. These can be done through exercise or by taking medicine that makes your heart beat more quickly.  Blood tests.  Other imaging tests.  How is this treated? Treatment depends on what is causing your chest pain. Treatment may include:  Medicines. These may include: ? Acid blockers for heartburn. ? Anti-inflammatory medicine. ? Pain medicine for inflammatory conditions. ? Antibiotic medicine, if an infection is  present. ? Medicines to dissolve blood clots. ? Medicines to treat coronary artery disease (CAD).  Supportive care for conditions that do not require medicines. This may include: ? Resting. ? Applying heat or cold packs to injured areas. ? Limiting activities until pain decreases.  Follow these instructions at home: Medicines  If you were prescribed an antibiotic, take it as told by your health care provider. Do not stop taking the antibiotic even if you start to feel better.  Take over-the-counter and prescription medicines only as told by your health care provider. Lifestyle  Do not use any products that contain nicotine or tobacco, such as cigarettes and e-cigarettes. If you need help quitting, ask your health care provider.  Do not drink alcohol.  Make lifestyle changes as directed by your health care provider. These may include: ? Getting regular exercise. Ask your health care provider to suggest some activities that are safe for you. ? Eating a heart-healthy diet. A registered dietitian can help you to learn healthy eating options. ? Maintaining a healthy weight. ? Managing diabetes, if necessary. ? Reducing stress, such as with yoga or relaxation techniques. General instructions  Avoid any activities that bring on chest pain.  If heartburn is the cause for your chest pain, raise (elevate) the head of your bed about 6 inches (15 cm) by putting blocks under the legs. Sleeping with more pillows does not effectively relieve heartburn because it only changes the position of your head.  Keep all follow-up visits as told by your health care provider. This is important. This includes any further testing if your chest pain does not go away. Contact a health care provider if:  Your chest pain does not go away.  You have a rash with blisters on your chest.  You have a fever.  You have chills. Get help right away if:  Your chest pain is worse.  You have a cough that gets  worse, or you cough up blood.  You have severe pain in your abdomen.  You have severe weakness.  You faint.  You have sudden, unexplained chest discomfort.  You have sudden, unexplained discomfort in your arms, back, neck, or jaw.  You have shortness of breath at any time.  You suddenly start to sweat, or your skin gets clammy.  You feel nauseous or you vomit.  You suddenly feel light-headed or dizzy.  Your heart begins to beat quickly, or it feels like it is skipping beats. These symptoms may represent a serious problem that is an emergency. Do not wait to see if the symptoms will go away. Get medical help right away. Call your local emergency services (911 in the [place] Do not drive yourself to the hospital. This information is not intended to replace advice given to you by your health care provider. Make sure you discuss any questions you have with your health care provider. Document Released: 02/28/2005 Document Revised: 02/13/2016 Document Reviewed: 02/13/2016 Elsevier Interactive Patient Education  2017 [employer].  IF you received an x-ray today, you will receive an invoice from [facility]. Please contact [facility] at [phone] with questions or concerns regarding your invoice.   IF you received labwork today, you will receive an invoice from [facility]. Please contact [facility] at [phone] with questions or concerns regarding your invoice.   Our billing staff will not be able to assist you with questions regarding bills from these companies.  You will be contacted with the lab results as soon as they are available. The fastest way to get your results is to activate your My Chart account. Instructions are located on the last page of this paperwork. If you have not heard from [place] regarding the results in 2 weeks, please contact this office.       I personally performed the services described in this documentation, which was scribed in my  presence. The recorded information has been reviewed and considered for accuracy and completeness, addended by me as needed, and agree with information above.  Signed,   [name], MD [facility] at [facility].  01/11/17 10:55 AM

## 2017-01-25 ENCOUNTER — Telehealth: Payer: Self-pay

## 2017-01-25 NOTE — Telephone Encounter (Signed)
Called pt to schedule Medicare Annual Wellness Visit. -nr  

## 2017-01-31 DIAGNOSIS — M1711 Unilateral primary osteoarthritis, right knee: Secondary | ICD-10-CM | POA: Diagnosis not present

## 2017-02-14 DIAGNOSIS — Z01818 Encounter for other preprocedural examination: Secondary | ICD-10-CM | POA: Diagnosis not present

## 2017-02-14 DIAGNOSIS — R9431 Abnormal electrocardiogram [ECG] [EKG]: Secondary | ICD-10-CM | POA: Diagnosis not present

## 2017-02-14 DIAGNOSIS — R0789 Other chest pain: Secondary | ICD-10-CM | POA: Diagnosis not present

## 2017-02-14 DIAGNOSIS — E669 Obesity, unspecified: Secondary | ICD-10-CM | POA: Diagnosis not present

## 2017-02-18 DIAGNOSIS — G8918 Other acute postprocedural pain: Secondary | ICD-10-CM | POA: Diagnosis not present

## 2017-02-18 DIAGNOSIS — Z96651 Presence of right artificial knee joint: Secondary | ICD-10-CM | POA: Diagnosis not present

## 2017-02-18 DIAGNOSIS — M24661 Ankylosis, right knee: Secondary | ICD-10-CM | POA: Diagnosis not present

## 2017-02-18 DIAGNOSIS — M1711 Unilateral primary osteoarthritis, right knee: Secondary | ICD-10-CM | POA: Diagnosis not present

## 2017-02-19 DIAGNOSIS — Z96651 Presence of right artificial knee joint: Secondary | ICD-10-CM | POA: Diagnosis not present

## 2017-02-19 DIAGNOSIS — M25661 Stiffness of right knee, not elsewhere classified: Secondary | ICD-10-CM | POA: Diagnosis not present

## 2017-02-19 DIAGNOSIS — M6281 Muscle weakness (generalized): Secondary | ICD-10-CM | POA: Diagnosis not present

## 2017-02-19 DIAGNOSIS — M25561 Pain in right knee: Secondary | ICD-10-CM | POA: Diagnosis not present

## 2017-02-20 DIAGNOSIS — M6281 Muscle weakness (generalized): Secondary | ICD-10-CM | POA: Diagnosis not present

## 2017-02-20 DIAGNOSIS — M25561 Pain in right knee: Secondary | ICD-10-CM | POA: Diagnosis not present

## 2017-02-20 DIAGNOSIS — Z96651 Presence of right artificial knee joint: Secondary | ICD-10-CM | POA: Diagnosis not present

## 2017-02-20 DIAGNOSIS — M25661 Stiffness of right knee, not elsewhere classified: Secondary | ICD-10-CM | POA: Diagnosis not present

## 2017-02-21 DIAGNOSIS — M25661 Stiffness of right knee, not elsewhere classified: Secondary | ICD-10-CM | POA: Diagnosis not present

## 2017-02-21 DIAGNOSIS — Z96651 Presence of right artificial knee joint: Secondary | ICD-10-CM | POA: Diagnosis not present

## 2017-02-21 DIAGNOSIS — M25561 Pain in right knee: Secondary | ICD-10-CM | POA: Diagnosis not present

## 2017-02-21 DIAGNOSIS — M6281 Muscle weakness (generalized): Secondary | ICD-10-CM | POA: Diagnosis not present

## 2017-02-25 DIAGNOSIS — M6281 Muscle weakness (generalized): Secondary | ICD-10-CM | POA: Diagnosis not present

## 2017-02-25 DIAGNOSIS — M25561 Pain in right knee: Secondary | ICD-10-CM | POA: Diagnosis not present

## 2017-02-25 DIAGNOSIS — M25661 Stiffness of right knee, not elsewhere classified: Secondary | ICD-10-CM | POA: Diagnosis not present

## 2017-02-25 DIAGNOSIS — Z96651 Presence of right artificial knee joint: Secondary | ICD-10-CM | POA: Diagnosis not present

## 2017-02-26 DIAGNOSIS — Z96651 Presence of right artificial knee joint: Secondary | ICD-10-CM | POA: Diagnosis not present

## 2017-02-26 DIAGNOSIS — M6281 Muscle weakness (generalized): Secondary | ICD-10-CM | POA: Diagnosis not present

## 2017-02-26 DIAGNOSIS — M25661 Stiffness of right knee, not elsewhere classified: Secondary | ICD-10-CM | POA: Diagnosis not present

## 2017-02-26 DIAGNOSIS — M25561 Pain in right knee: Secondary | ICD-10-CM | POA: Diagnosis not present

## 2017-02-28 DIAGNOSIS — M6281 Muscle weakness (generalized): Secondary | ICD-10-CM | POA: Diagnosis not present

## 2017-02-28 DIAGNOSIS — Z96651 Presence of right artificial knee joint: Secondary | ICD-10-CM | POA: Diagnosis not present

## 2017-02-28 DIAGNOSIS — M25661 Stiffness of right knee, not elsewhere classified: Secondary | ICD-10-CM | POA: Diagnosis not present

## 2017-02-28 DIAGNOSIS — M25561 Pain in right knee: Secondary | ICD-10-CM | POA: Diagnosis not present

## 2017-03-01 DIAGNOSIS — M25561 Pain in right knee: Secondary | ICD-10-CM | POA: Diagnosis not present

## 2017-03-01 DIAGNOSIS — M6281 Muscle weakness (generalized): Secondary | ICD-10-CM | POA: Diagnosis not present

## 2017-03-01 DIAGNOSIS — M25661 Stiffness of right knee, not elsewhere classified: Secondary | ICD-10-CM | POA: Diagnosis not present

## 2017-03-01 DIAGNOSIS — Z96651 Presence of right artificial knee joint: Secondary | ICD-10-CM | POA: Diagnosis not present

## 2017-03-06 DIAGNOSIS — M6281 Muscle weakness (generalized): Secondary | ICD-10-CM | POA: Diagnosis not present

## 2017-03-06 DIAGNOSIS — M25661 Stiffness of right knee, not elsewhere classified: Secondary | ICD-10-CM | POA: Diagnosis not present

## 2017-03-06 DIAGNOSIS — Z96651 Presence of right artificial knee joint: Secondary | ICD-10-CM | POA: Diagnosis not present

## 2017-03-06 DIAGNOSIS — M25561 Pain in right knee: Secondary | ICD-10-CM | POA: Diagnosis not present

## 2017-03-08 DIAGNOSIS — M6281 Muscle weakness (generalized): Secondary | ICD-10-CM | POA: Diagnosis not present

## 2017-03-08 DIAGNOSIS — M25561 Pain in right knee: Secondary | ICD-10-CM | POA: Diagnosis not present

## 2017-03-08 DIAGNOSIS — Z96651 Presence of right artificial knee joint: Secondary | ICD-10-CM | POA: Diagnosis not present

## 2017-03-08 DIAGNOSIS — M25661 Stiffness of right knee, not elsewhere classified: Secondary | ICD-10-CM | POA: Diagnosis not present

## 2017-03-11 DIAGNOSIS — M6281 Muscle weakness (generalized): Secondary | ICD-10-CM | POA: Diagnosis not present

## 2017-03-11 DIAGNOSIS — M25661 Stiffness of right knee, not elsewhere classified: Secondary | ICD-10-CM | POA: Diagnosis not present

## 2017-03-11 DIAGNOSIS — Z96651 Presence of right artificial knee joint: Secondary | ICD-10-CM | POA: Diagnosis not present

## 2017-03-11 DIAGNOSIS — M25561 Pain in right knee: Secondary | ICD-10-CM | POA: Diagnosis not present

## 2017-03-18 ENCOUNTER — Encounter: Payer: Self-pay | Admitting: Family Medicine

## 2017-03-18 ENCOUNTER — Ambulatory Visit (INDEPENDENT_AMBULATORY_CARE_PROVIDER_SITE_OTHER): Payer: PPO | Admitting: Family Medicine

## 2017-03-18 VITALS — BP 130/79 | HR 95 | Temp 97.9°F | Resp 16 | Ht 65.0 in | Wt 190.0 lb

## 2017-03-18 DIAGNOSIS — R0683 Snoring: Secondary | ICD-10-CM

## 2017-03-18 DIAGNOSIS — F5104 Psychophysiologic insomnia: Secondary | ICD-10-CM | POA: Diagnosis not present

## 2017-03-18 DIAGNOSIS — R400 Somnolence: Secondary | ICD-10-CM

## 2017-03-18 DIAGNOSIS — F4323 Adjustment disorder with mixed anxiety and depressed mood: Secondary | ICD-10-CM

## 2017-03-18 DIAGNOSIS — Z23 Encounter for immunization: Secondary | ICD-10-CM | POA: Diagnosis not present

## 2017-03-18 NOTE — Progress Notes (Signed)
Subjective:  By signing my name below, I, Zoe Kim, attest that this documentation has been prepared under the direction and in the presence of Zoe Agreste, MD Electronically Signed: Ladene Artist, ED Scribe 03/18/2017 at 2:59 PM.   Patient ID: Zoe Kim, female    DOB: [birth year], 71 y.o.   MRN: [id number]  Chief Complaint  Patient presents with  . sleeping issues    being having sleeping issues for awhile now;  has tried OTC meds with no help  . Immunizations    flu   HPI Zoe Kim is a 71 y.o. female who presents to Primary Care at [place]. Pt has been treated with situational anxiety in the past as well as depression. Previously treated with Prozac, increased to 40 mg in March, Klonopin 0.5 mg as needed for insomnia or situational anxiety. I last saw her on 8/9. Still having significant anxiety, some work/financial stressors. Was not using Klonopin at that time. Tried a different sleep medication Belsomra 10 mg at that time with option to use Klonopin if needed.   Pt is still taking Prozac daily. States she discontinued Belsomra after taking 2 tablets without any relief. She does not recall picking up Klonopin. Pt reports that she is currently sleeping only 2-4 hours/nightly; falls asleep around 11 PM and wakes around 3-4 AM to watch TV. Also reports having a glass of unsweetened iced tea with her dinner. She does report daytime somnolence and confusion/not thinking clearly throughout the day; states she walks into classrooms and does not recall why she initially walked into the room. Reports that she is still very anxious due to financial reasons and is considering taking a few months off. Denies sob or cp upon waking. Pt is unsure if she snores but suspects that she does; denies apnea.  Patient Active Problem List   Diagnosis Date Noted  . Primary localized osteoarthritis of right knee 07/30/2016  . Anxiety   . Primary localized osteoarthrosis of the  knee, right   . Urinary urgency    Past Medical History:  Diagnosis Date  . Anxiety   . Arthritis   . Depression   . Heart murmur    "years ago"  . Insomnia   . Pneumonia    "long time ago"  . Primary localized osteoarthrosis of the knee, right   . Urinary urgency    Past Surgical History:  Procedure Laterality Date  . COLONOSCOPY    . EYE SURGERY Bilateral    cataract with lens  . TONSILLECTOMY    . TOTAL KNEE ARTHROPLASTY Right 07/30/2016   Procedure: TOTAL KNEE ARTHROPLASTY;  Surgeon: Elsie Saas, MD;  Location: [facility];  Service: Orthopedics;  Laterality: Right;  . VEIN LIGATION AND STRIPPING     Allergies  Allergen Reactions  . Sulfa Antibiotics     UNSPECIFIED REACTION    Prior to Admission medications   Medication Sig Start Date End Date Taking? Authorizing Provider  acetaminophen (TYLENOL) 500 MG tablet Take 1,000 mg by mouth every 6 (six) hours as needed for moderate pain.    [provider]  clonazePAM (KLONOPIN) 0.5 MG tablet Take 0.5-1 tablets (0.25-0.5 mg total) by mouth 2 (two) times daily as needed for anxiety. 01/10/17   Zoe Agreste, MD  FLUoxetine (PROZAC) 20 MG tablet Take 2 tablets (40 mg total) by mouth daily. 01/10/17   Zoe Agreste, MD  MELATONIN PO Take 1 tablet by mouth at bedtime as needed (sleep).  [provider]  oxybutynin (DITROPAN) 5 MG tablet Take 0.5-1 tablets (2.5-5 mg total) by mouth 3 (three) times daily. 07/05/16   Zoe Agreste, MD  Suvorexant (BELSOMRA) 10 MG TABS Take 10 mg by mouth at bedtime. 01/10/17   Zoe Agreste, MD   Social History   Social History  . Marital status: Married    Spouse name: N/A  . Number of children: N/A  . Years of education: N/A   Occupational History  . Not on file.   Social History Main Topics  . Smoking status: Never Smoker  . Smokeless tobacco: Never Used     Comment: smoked 2 weeks in college  . Alcohol use No  . Drug use: No  . Sexual activity: Yes   Other  Topics Concern  . Not on file   Social History Narrative  . No narrative on file   Review of Systems  Respiratory: Negative for shortness of breath.   Cardiovascular: Negative for chest pain.  Psychiatric/Behavioral: Positive for sleep disturbance. The patient is nervous/anxious.       Objective:   Physical Exam  Constitutional: She is oriented to person, place, and time. She appears well-developed and well-nourished. No distress.  HENT:  Head: Normocephalic and atraumatic.  Eyes: Conjunctivae and EOM are normal.  Neck: Neck supple. No tracheal deviation present.  Cardiovascular: Normal rate and regular rhythm.  Exam reveals no gallop and no friction rub.   No murmur heard. Pulmonary/Chest: Effort normal and breath sounds normal. No respiratory distress.  Musculoskeletal: Normal range of motion.  Neurological: She is alert and oriented to person, place, and time.  Skin: Skin is warm and dry.  Psychiatric: She has a normal mood and affect. Her behavior is normal.  Nursing note and vitals reviewed.  Vitals:   03/18/17 1427  BP: 130/79  Pulse: 95  Resp: 16  Temp: 97.9 F (36.6 C)  TempSrc: Oral  SpO2: 96%  Weight: 190 lb (86.2 kg)  Height: 5\' 5"  (1.651 m)      Assessment & Plan:  [name] is a 71 y.o. female Psychophysiological insomnia - Plan: Ambulatory referral to Sleep Studies Snoring - Plan: Ambulatory referral to Sleep Studies Daytime somnolence - Plan: Ambulatory referral to Sleep Studies  -Persistent insomnia with frequent wakening. Does admit to some snoring, and daytime somnolence, but that may also be related to insufficient sleep.  -Sleep hygiene and techniques for more restful sleep were discussed, including avoidance of caffeine in the afternoon  -Recommended trial of Belsomra for more than just 1 or 2 nights. Alternatively could try the benzodiazepine was prescribed previously, or ultimately could try Ambien.  -Refer to sleep studies for  possible OSA testing.  She does have a pretty busy schedule commitment these next few months, so initial home screening may be an option, but will defer to sleep specialist.  Need for influenza vaccination - Plan: Flu Vaccine QUAD 6+ mos PF IM (Fluarix Quad PF)  Adjustment disorder with mixed anxiety and depressed mood  -Still with significant stress due to work. Tolerating Prozac at current dose, would like to see if improved sleep also help anxiety/depression symptoms prior to change in SSRI.  No orders of the defined types were placed in this encounter.  Patient Instructions     Try Belsomra every night this week to see if it is more effective than just a few nights. If you have had no relief at all into next week, we can try Ambien.  Another option is trying the clonazepam that was prescribed last visit. I will also refer you to sleep studies for possible obstructive sleep apnea testing.   Keep me updated on symptoms. Once sleep has improved, can look to see if changes in Prozac are needed.  IF you received an x-ray today, you will receive an invoice from [facility]. Please contact [facility] at [phone] with questions or concerns regarding your invoice.   IF you received labwork today, you will receive an invoice from [place]. Please contact [facility] at [phone] with questions or concerns regarding your invoice.   Our billing staff will not be able to assist you with questions regarding bills from these companies.  You will be contacted with the lab results as soon as they are available. The fastest way to get your results is to activate your My Chart account. Instructions are located on the last page of this paperwork. If you have not heard from [place] regarding the results in 2 weeks, please contact this office.       I personally performed the services described in this documentation, which was scribed in my presence. The recorded information has been  reviewed and considered for accuracy and completeness, addended by me as needed, and agree with information above.  Signed,   [name], MD Primary Care at [place].  03/20/17 10:00 PM

## 2017-03-18 NOTE — Patient Instructions (Addendum)
   Try Belsomra every night this week to see if it is more effective than just a few nights. If you have had no relief at all into next week, we can try Ambien. Another option is trying the clonazepam that was prescribed last visit. I will also refer you to sleep studies for possible obstructive sleep apnea testing.   Keep me updated on symptoms. Once sleep has improved, can look to see if changes in Prozac are needed.  IF you received an x-ray today, you will receive an invoice from [facility]. Please contact [facility] at [phone] with questions or concerns regarding your invoice.   IF you received labwork today, you will receive an invoice from [place]. Please contact [facility] at [phone] with questions or concerns regarding your invoice.   Our billing staff will not be able to assist you with questions regarding bills from these companies.  You will be contacted with the lab results as soon as they are available. The fastest way to get your results is to activate your My Chart account. Instructions are located on the last page of this paperwork. If you have not heard from [place] regarding the results in 2 weeks, please contact this office.

## 2017-04-04 ENCOUNTER — Other Ambulatory Visit: Payer: Self-pay | Admitting: Family Medicine

## 2017-04-04 DIAGNOSIS — N3941 Urge incontinence: Secondary | ICD-10-CM

## 2017-04-05 ENCOUNTER — Other Ambulatory Visit: Payer: Self-pay | Admitting: Family Medicine

## 2017-04-05 ENCOUNTER — Other Ambulatory Visit: Payer: Self-pay

## 2017-04-05 DIAGNOSIS — N3941 Urge incontinence: Secondary | ICD-10-CM

## 2017-04-24 ENCOUNTER — Institutional Professional Consult (permissible substitution): Payer: PPO | Admitting: Neurology

## 2017-05-14 ENCOUNTER — Other Ambulatory Visit: Payer: Self-pay | Admitting: Family Medicine

## 2017-05-14 DIAGNOSIS — N3941 Urge incontinence: Secondary | ICD-10-CM

## 2017-05-15 ENCOUNTER — Other Ambulatory Visit: Payer: Self-pay | Admitting: Family Medicine

## 2017-05-15 DIAGNOSIS — N3941 Urge incontinence: Secondary | ICD-10-CM

## 2017-05-16 NOTE — Telephone Encounter (Signed)
Refill was already sent yesterday.

## 2017-05-30 ENCOUNTER — Ambulatory Visit (INDEPENDENT_AMBULATORY_CARE_PROVIDER_SITE_OTHER): Payer: PPO

## 2017-05-30 VITALS — BP 138/80 | HR 100 | Ht 65.0 in | Wt 190.0 lb

## 2017-05-30 DIAGNOSIS — E2839 Other primary ovarian failure: Secondary | ICD-10-CM

## 2017-05-30 DIAGNOSIS — Z1211 Encounter for screening for malignant neoplasm of colon: Secondary | ICD-10-CM

## 2017-05-30 DIAGNOSIS — Z0000 Encounter for general adult medical examination without abnormal findings: Secondary | ICD-10-CM | POA: Diagnosis not present

## 2017-05-30 NOTE — Progress Notes (Signed)
Subjective:   Zoe Kim is a 71 y.o. female who presents for Medicare Annual (Subsequent) preventive examination.  Review of Systems:  N/A Cardiac Risk Factors include: advanced age (>14men, >70 women);obesity (BMI >30kg/m2)     Objective:     Vitals: BP 138/80   Pulse 100   Ht 5\' 5"  (1.651 m)   Wt 190 lb (86.2 kg)   SpO2 95%   BMI 31.62 kg/m   Body mass index is 31.62 kg/m.  Advanced Directives 05/30/2017 07/19/2016  Does Patient Have a Medical Advance Directive? No No  Would patient like information on creating a medical advance directive? Yes (MAU/Ambulatory/Procedural Areas - Information given) No - Patient declined    Tobacco Social History   Tobacco Use  Smoking Status Never Smoker  Smokeless Tobacco Never Used  Tobacco Comment   smoked 2 weeks in college     Counseling given: Not Answered Comment: smoked 2 weeks in college   Clinical Intake:  Pre-visit preparation completed: Yes  Pain : 0-10 Pain Score: 3  Pain Type: Chronic pain Pain Location: Knee Pain Orientation: Right Pain Descriptors / Indicators: Aching Pain Onset: More than a month ago Pain Frequency: Intermittent     Nutritional Status: BMI > 30  Obese Nutritional Risks: None Diabetes: No  How often do you need to have someone help you when you read instructions, pamphlets, or other written materials from your doctor or pharmacy?: 1 - Never What is the last grade level you completed in school?: Bachelors degree  Interpreter Needed?: No  Information entered by :: Andrez Grime, LPN  Past Medical History:  Diagnosis Date  . Anxiety   . Arthritis   . Depression   . Heart murmur    "years ago"  . Insomnia   . Pneumonia    "long time ago"  . Primary localized osteoarthrosis of the knee, right   . Urinary urgency    Past Surgical History:  Procedure Laterality Date  . COLONOSCOPY    . EYE SURGERY Bilateral    cataract with lens  . TONSILLECTOMY    . TOTAL  KNEE ARTHROPLASTY Right 07/30/2016   Procedure: TOTAL KNEE ARTHROPLASTY;  Surgeon: Elsie Saas, MD;  Location: [place];  Service: Orthopedics;  Laterality: Right;  . VEIN LIGATION AND STRIPPING     Family History  Problem Relation Age of Onset  . Heart disease Mother   . COPD Mother   . Cancer Mother   . Heart disease Father   . COPD Brother   . Heart disease Brother   . Cancer Brother   . Cancer Maternal Grandmother        breast  . Stroke Maternal Grandfather    Social History   Socioeconomic History  . Marital status: Married    Spouse name: None  . Number of children: 0  . Years of education: None  . Highest education level: Bachelor's degree (e.g., BA, AB, BS)  Social Needs  . Financial resource strain: Not hard at all  . Food insecurity - worry: Never true  . Food insecurity - inability: Never true  . Transportation needs - medical: No  . Transportation needs - non-medical: No  Occupational History  . None  Tobacco Use  . Smoking status: Never Smoker  . Smokeless tobacco: Never Used  . Tobacco comment: smoked 2 weeks in college  Substance and Sexual Activity  . Alcohol use: No  . Drug use: No  . Sexual activity: Yes  Other  Topics Concern  . None  Social History Narrative  . None    Outpatient Encounter Medications as of 05/30/2017  Medication Sig  . acetaminophen (TYLENOL) 500 MG tablet Take 1,000 mg by mouth every 6 (six) hours as needed for moderate pain.  Marland Kitchen FLUoxetine (PROZAC) 20 MG tablet Take 2 tablets (40 mg total) by mouth daily.  Marland Kitchen MELATONIN PO Take 1 tablet by mouth at bedtime as needed (sleep).  Marland Kitchen oxybutynin (DITROPAN) 5 MG tablet TAKE 1/2-1 TABLET BY MOUTH THREE TIMES DAILY  . [DISCONTINUED] clonazePAM (KLONOPIN) 0.5 MG tablet Take 0.5-1 tablets (0.25-0.5 mg total) by mouth 2 (two) times daily as needed for anxiety. (Patient not taking: Reported on 03/18/2017)  . [DISCONTINUED] Suvorexant (BELSOMRA) 10 MG TABS Take 10 mg by mouth at bedtime.  (Patient not taking: Reported on 03/18/2017)   No facility-administered encounter medications on file as of 05/30/2017.     Activities of Daily Living In your present state of health, do you have any difficulty performing the following activities: 05/30/2017 07/19/2016  Hearing? N N  Vision? Y N  Comment Patient is far sighted -  Difficulty concentrating or making decisions? Y Y  Comment Patient has issues with her memory forgetful-  Walking or climbing stairs? N Y  Comment - due to knee- one step at a time  Dressing or bathing? N -  Doing errands, shopping? N N  Preparing Food and eating ? N -  Using the Toilet? N -  In the past six months, have you accidently leaked urine? Y -  Comment Patient takes medication for urine leakage -  Do you have problems with loss of bowel control? N -  Managing your Medications? N -  Managing your Finances? N -  Housekeeping or managing your Housekeeping? N -  Some recent data might be hidden    Patient Care Team: [name], MD as PCP - General (Family Medicine) Calvert Cantor, MD as Consulting Physician (Ophthalmology)    Assessment:   This is a routine wellness examination for Zoe Kim.  Exercise Activities and Dietary recommendations Current Exercise Habits: The patient does not participate in regular exercise at present, Exercise limited by: None identified  Goals    . Exercise 3x per week (30 min per time)     Patient states that she wants to start exercising and lose weight.        Fall Risk Fall Risk  05/30/2017 03/18/2017 01/10/2017 08/28/2016 07/05/2016  Falls in the past year? Yes Yes Yes No No  Comment - had knee surgery; fell due to balance afterwards - - -  Number falls in past yr: 2 or more 1 2 or more - -  Injury with Fall? No No Yes - -  Risk Factor Category  - - High Fall Risk - -  Risk for fall due to : History of fall(s) - - - -  Risk for fall due to: Comment slipped on ice, fall due to knee problems - - - -    Follow up Falls prevention discussed - - - -   Is the patient's home free of loose throw rugs in walkways, pet beds, electrical cords, etc?   yes      Grab bars in the bathroom? No      Handrails on the stairs?   yes      Adequate lighting?   yes  Timed Get Up and Go performed: yes, passed,completed in 30 seconds  Depression Screen  Immunization History  Administered Date(s) Administered  . Influenza,inj,Quad PF,6+ Mos 07/27/2015, 07/05/2016, 03/18/2017  . Tdap 06/09/2012    Qualifies for Shingles Vaccine? yes, advised patient to go to the pharmacy to receive the Shingrix vaccine.   Screening Tests Health Maintenance  Topic Date Due  . COLONOSCOPY  07/01/1994  . DEXA SCAN  07/01/2009  . Hepatitis C Screening  05/30/2018 (Originally Jan 10, 1946)  . PNA vac Low Risk Adult (1 of 2 - PCV13) 05/30/2018 (Originally 07/01/2009)  . MAMMOGRAM  10/16/2018  . TETANUS/TDAP  06/09/2022  . INFLUENZA VACCINE  Completed    Cancer Screenings: Lung: Low Dose CT Chest recommended if Age 73-80 years, 30 pack-year currently smoking OR have quit w/in 15years. Patient does not qualify. Breast:  Up to date on Mammogram? Yes, completed 01/02/2017 Up to date of Bone Density/Dexa? No, [facility] sent to [facility] to [facility] patient up for this test.  Colorectal: Referral sent to [facility] to set patient up for this test.   Additional Screenings:  Hepatitis B/HIV/Syphillis: not indicated  Hepatitis C Screening: Patient declined to have test drawn at this time.      Plan:   I have personally reviewed and noted the following in the patient's chart:   . Medical and social history . Use of alcohol, tobacco or illicit drugs  . Current medications and supplements . Functional ability and status . Nutritional status . Physical activity . Advanced directives . List of other physicians . Hospitalizations, surgeries, and ER visits in previous 12 months . Vitals . Screenings to include cognitive, depression,  and falls . Referrals and appointments  In addition, I have reviewed and discussed with patient certain preventive protocols, quality metrics, and best practice recommendations. A written personalized care plan for preventive services as well as general preventive health recommendations were provided to patient. Patient declined blood work at this time. Patient was also not fasting today.  Patient declined Prevnar 13.   1. Estrogen deficiency - DEXAScan  2. Special screening for malignant neoplasms, colon - Ambulatory referral to Gastroenterology (for Colonoscopy)  3. Encounter for Medicare annual wellness exam      Andrez Grime, LPN  68/34/1963

## 2017-05-30 NOTE — Patient Instructions (Addendum)
[name] , Thank you for taking time to come for your Medicare Wellness Visit. I appreciate your ongoing commitment to your health goals. Please review the following plan we discussed and let me know if I can assist you in the future.   Screening recommendations/referrals: Colonoscopy: GI office will contact you to schedule appointment. Mammogram: up to date, next due 01/03/2019 Bone Density: [facility] will contact you to setup appointment. Recommended yearly ophthalmology/optometry visit for glaucoma screening and checkup Recommended yearly dental visit for hygiene and checkup  Vaccinations: Influenza vaccine: up to date Pneumococcal vaccine: declined Tdap vaccine: up to date, next due 06/09/2022 Shingles vaccine: Check with your pharmacy about receiving the Shingrix vaccine    Advanced directives: Advance directive discussed with you today. I have provided a copy for you to complete at home and have notarized. Once this is complete please bring a copy in to our office so we can scan it into your chart.  Conditions/risks identified: Try to start exercising and hopefully lose some weight.   Next appointment: schedule follow up visit with PCP, 1 year for AWV    Preventive Care 68 Years and Older, Female Preventive care refers to lifestyle choices and visits with your health care provider that can promote health and wellness. What does preventive care include?  A yearly physical exam. This is also called an annual well check.  Dental exams once or twice a year.  Routine eye exams. Ask your health care provider how often you should have your eyes checked.  Personal lifestyle choices, including:  Daily care of your teeth and gums.  Regular physical activity.  Eating a healthy diet.  Avoiding tobacco and drug use.  Limiting alcohol use.  Practicing safe sex.  Taking low-dose aspirin every day.  Taking vitamin and mineral supplements as recommended by your health care  provider. What happens during an annual well check? The services and screenings done by your health care provider during your annual well check will depend on your age, overall health, lifestyle risk factors, and family history of disease. Counseling  Your health care provider may ask you questions about your:  Alcohol use.  Tobacco use.  Drug use.  Emotional well-being.  Home and relationship well-being.  Sexual activity.  Eating habits.  History of falls.  Memory and ability to understand (cognition).  Work and work Statistician.  Reproductive health. Screening  You may have the following tests or measurements:  Height, weight, and BMI.  Blood pressure.  Lipid and cholesterol levels. These may be checked every 5 years, or more frequently if you are over 2 years old.  Skin check.  Lung cancer screening. You may have this screening every year starting at age 69 if you have a 30-pack-year history of smoking and currently smoke or have quit within the past 15 years.  Fecal occult blood test (FOBT) of the stool. You may have this test every year starting at age 60.  Flexible sigmoidoscopy or colonoscopy. You may have a sigmoidoscopy every 5 years or a colonoscopy every 10 years starting at age 64.  Hepatitis C blood test.  Hepatitis B blood test.  Sexually transmitted disease (STD) testing.  Diabetes screening. This is done by checking your blood sugar (glucose) after you have not eaten for a while (fasting). You may have this done every 1-3 years.  Bone density scan. This is done to screen for osteoporosis. You may have this done starting at age 81.  Mammogram. This may be done  every 1-2 years. Talk to your health care provider about how often you should have regular mammograms. Talk with your health care provider about your test results, treatment options, and if necessary, the need for more tests. Vaccines  Your health care provider may recommend certain  vaccines, such as:  Influenza vaccine. This is recommended every year.  Tetanus, diphtheria, and acellular pertussis (Tdap, Td) vaccine. You may need a Td booster every 10 years.  Zoster vaccine. You may need this after age 53.  Pneumococcal 13-valent conjugate (PCV13) vaccine. One dose is recommended after age 23.  Pneumococcal polysaccharide (PPSV23) vaccine. One dose is recommended after age 20. Talk to your health care provider about which screenings and vaccines you need and how often you need them. This information is not intended to replace advice given to you by your health care provider. Make sure you discuss any questions you have with your health care provider. Document Released: 06/17/2015 Document Revised: 02/08/2016 Document Reviewed: 03/22/2015 Elsevier Interactive Patient Education  2017 [place] Prevention in the Home Falls can cause injuries. They can happen to people of all ages. There are many things you can do to make your home safe and to help prevent falls. What can I do on the outside of my home?  Regularly fix the edges of walkways and driveways and fix any cracks.  Remove anything that might make you trip as you walk through a door, such as a raised step or threshold.  Trim any bushes or trees on the path to your home.  Use bright outdoor lighting.  Clear any walking paths of anything that might make someone trip, such as rocks or tools.  Regularly check to see if handrails are loose or broken. Make sure that both sides of any steps have handrails.  Any raised decks and porches should have guardrails on the edges.  Have any leaves, snow, or ice cleared regularly.  Use sand or salt on walking paths during winter.  Clean up any spills in your garage right away. This includes oil or grease spills. What can I do in the bathroom?  Use night lights.  Install grab bars by the toilet and in the tub and shower. Do not use towel bars as grab  bars.  Use non-skid mats or decals in the tub or shower.  If you need to sit down in the shower, use a plastic, non-slip stool.  Keep the floor dry. Clean up any water that spills on the floor as soon as it happens.  Remove soap buildup in the tub or shower regularly.  Attach bath mats securely with double-sided non-slip rug tape.  Do not have throw rugs and other things on the floor that can make you trip. What can I do in the bedroom?  Use night lights.  Make sure that you have a light by your bed that is easy to reach.  Do not use any sheets or blankets that are too big for your bed. They should not hang down onto the floor.  Have a firm chair that has side arms. You can use this for support while you get dressed.  Do not have throw rugs and other things on the floor that can make you trip. What can I do in the kitchen?  Clean up any spills right away.  Avoid walking on wet floors.  Keep items that you use a lot in easy-to-reach places.  If you need to reach something above you, use a  strong step stool that has a grab bar.  Keep electrical cords out of the way.  Do not use floor polish or wax that makes floors slippery. If you must use wax, use non-skid floor wax.  Do not have throw rugs and other things on the floor that can make you trip. What can I do with my stairs?  Do not leave any items on the stairs.  Make sure that there are handrails on both sides of the stairs and use them. Fix handrails that are broken or loose. Make sure that handrails are as long as the stairways.  Check any carpeting to make sure that it is firmly attached to the stairs. Fix any carpet that is loose or worn.  Avoid having throw rugs at the top or bottom of the stairs. If you do have throw rugs, attach them to the floor with carpet tape.  Make sure that you have a light switch at the top of the stairs and the bottom of the stairs. If you do not have them, ask someone to add them for  you. What else can I do to help prevent falls?  Wear shoes that:  Do not have high heels.  Have rubber bottoms.  Are comfortable and fit you well.  Are closed at the toe. Do not wear sandals.  If you use a stepladder:  Make sure that it is fully opened. Do not climb a closed stepladder.  Make sure that both sides of the stepladder are locked into place.  Ask someone to hold it for you, if possible.  Clearly mark and make sure that you can see:  Any grab bars or handrails.  First and last steps.  Where the edge of each step is.  Use tools that help you move around (mobility aids) if they are needed. These include:  Canes.  Walkers.  Scooters.  Crutches.  Turn on the lights when you go into a dark area. Replace any light bulbs as soon as they burn out.  Set up your furniture so you have a clear path. Avoid moving your furniture around.  If any of your floors are uneven, fix them.  If there are any pets around you, be aware of where they are.  Review your medicines with your doctor. Some medicines can make you feel dizzy. This can increase your chance of falling. Ask your doctor what other things that you can do to help prevent falls. This information is not intended to replace advice given to you by your health care provider. Make sure you discuss any questions you have with your health care provider. Document Released: 03/17/2009 Document Revised: 10/27/2015 Document Reviewed: 06/25/2014 Elsevier Interactive Patient Education  2017 [employer].

## 2017-06-05 ENCOUNTER — Ambulatory Visit (INDEPENDENT_AMBULATORY_CARE_PROVIDER_SITE_OTHER): Payer: PPO | Admitting: Neurology

## 2017-06-05 ENCOUNTER — Encounter: Payer: Self-pay | Admitting: Neurology

## 2017-06-05 VITALS — BP 155/84 | HR 90 | Ht 65.0 in | Wt 192.0 lb

## 2017-06-05 DIAGNOSIS — G479 Sleep disorder, unspecified: Secondary | ICD-10-CM | POA: Diagnosis not present

## 2017-06-05 DIAGNOSIS — R0683 Snoring: Secondary | ICD-10-CM | POA: Diagnosis not present

## 2017-06-05 DIAGNOSIS — G478 Other sleep disorders: Secondary | ICD-10-CM | POA: Diagnosis not present

## 2017-06-05 DIAGNOSIS — R351 Nocturia: Secondary | ICD-10-CM

## 2017-06-05 DIAGNOSIS — E669 Obesity, unspecified: Secondary | ICD-10-CM | POA: Diagnosis not present

## 2017-06-05 NOTE — Progress Notes (Signed)
Subjective:    Patient ID: Zoe Kim is a 72 y.o. female.  HPI     Star Age, MD, PhD [facility] [address], [address] [place], [place] [address]  Dear Dr. Carlota Raspberry,   I saw your patient, Zoe Kim, upon your kind request in my neurologic clinic today for initial consultation of her sleep disorder, in particular, concern for underlying obstructive sleep apnea. The patient is unaccompanied today. As you know, Zoe Kim is a 72 year old right-handed woman with an underlying medical history of anxiety, depression, arthritis, hyperactive bladder and obesity, who reports difficulty maintaining sleep. She wakes up after 2-4 hours of sleep and cannot go back to sleep. This has been going on for several months. She does report quite a bit of stress at work. She and her husband both work with the [employer]. 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 She may snore mildly. Her husband snores, at times this is loud enough to disturb her sleep and she sleeps in the office at times. She had recent right knee arthroplasty in February 2018 and needed subsequent manipulation but overall has done well. She does not have any telltale symptoms versus leg syndrome, denies morning headaches but has nocturia about once or twice per average night. She has no family history of OSA. Bedtime is around 11 and often she is out of bed by 4 AM. She has 3 horses and 3 dogs at. They have no children. She does not watch TV in her bedroom. She has struggled with depression for the past several years. She is currently on generic Prozac. For anxiety she had been given a prescription for clonazepam but is currently not using it. I reviewed your office note from 03/18/2017. She has tried over-the-counter medications. She has tried prescription Belsomra recently but did not like the way she felt in the morning after it. She felt groggy. In the past, she may have  tried another prescription sleep aid. She has tried over-the-counter melatonin which may have helped more than anything else. Is a nonsmoker and does not typically utilize alcohol, she drinks caffeine in the form of iced tea, about 32 ounces per day, admits that she does not drink enough water typically.  Her Past Medical History Is Significant For: Past Medical History:  Diagnosis Date  . Anxiety   . Arthritis   . Depression   . Heart murmur    "years ago"  . Insomnia   . Pneumonia    "long time ago"  . Primary localized osteoarthrosis of the knee, right   . Urinary urgency     Her Past Surgical History Is Significant For: Past Surgical History:  Procedure Laterality Date  . COLONOSCOPY    . EYE SURGERY Bilateral    cataract with lens  . TONSILLECTOMY    . TOTAL KNEE ARTHROPLASTY Right 07/30/2016   Procedure: TOTAL KNEE ARTHROPLASTY;  Surgeon: Elsie Saas, MD;  Location: [facility];  Service: Orthopedics;  Laterality: Right;  . VEIN LIGATION AND STRIPPING      Her Family History Is Significant For: Family History  Problem Relation Age of Onset  . Heart disease Mother   . COPD Mother   . Cancer Mother   . Heart disease Father   . COPD Brother   . Heart disease Brother   . Cancer Brother   . Cancer Maternal Grandmother        breast  . Stroke Maternal Grandfather  Her Social History Is Significant For: Social History   Socioeconomic History  . Marital status: Married    Spouse name: None  . Number of children: 0  . Years of education: None  . Highest education level: Bachelor's degree (e.g., BA, AB, BS)  Social Needs  . Financial resource strain: Not hard at all  . Food insecurity - worry: Never true  . Food insecurity - inability: Never true  . Transportation needs - medical: No  . Transportation needs - non-medical: No  Occupational History  . None  Tobacco Use  . Smoking status: Never Smoker  . Smokeless tobacco: Never Used  . Tobacco comment: smoked 2  weeks in college  Substance and Sexual Activity  . Alcohol use: No  . Drug use: No  . Sexual activity: Yes  Other Topics Concern  . None  Social History Narrative  . None    Her Allergies Are:  Allergies  Allergen Reactions  . Sulfa Antibiotics     UNSPECIFIED REACTION   :   Her Current Medications Are:  Outpatient Encounter Medications as of 06/05/2017  Medication Sig  . acetaminophen (TYLENOL) 500 MG tablet Take 1,000 mg by mouth every 6 (six) hours as needed for moderate pain.  Marland Kitchen FLUoxetine (PROZAC) 20 MG tablet Take 2 tablets (40 mg total) by mouth daily.  Marland Kitchen MELATONIN PO Take 1 tablet by mouth at bedtime as needed (sleep).  Marland Kitchen oxybutynin (DITROPAN) 5 MG tablet TAKE 1/2-1 TABLET BY MOUTH THREE TIMES DAILY   No facility-administered encounter medications on file as of 06/05/2017.   :  Review of Systems:  Out of a complete 14 point review of systems, all are reviewed and negative with the exception of these symptoms as listed below:   Review of Systems  Neurological:       Pt presents today to discuss her sleep. Pt has never had a sleep study and does not think that she snores. Pt wakes up during the night and cannot go back to sleep.  Epworth Sleepiness Scale 0= would never doze 1= slight chance of dozing 2= moderate chance of dozing 3= high chance of dozing  Sitting and reading: 0 Watching TV: 1 Sitting inactive in a public place (ex. Theater or meeting): 0 As a passenger in a car for an hour without a break: 1 Lying down to rest in the afternoon: 2 Sitting and talking to someone: 0 Sitting quietly after lunch (no alcohol): 0 In a car, while stopped in traffic: 0 Total: 4     Objective:  Neurological Exam  Physical Exam Physical Examination:   Vitals:   06/05/17 1058  BP: (!) 155/84  Pulse: 90   General Examination: The patient is a very pleasant 72 y.o. female in no acute distress. She appears well-developed and well-nourished and well groomed.    HEENT: Normocephalic, atraumatic, pupils are equal, round and reactive to light and accommodation. Extraocular tracking is good without limitation to gaze excursion or nystagmus noted. Normal smooth pursuit is noted. Hearing is grossly intact. Face is symmetric with normal facial animation and normal facial sensation. Speech is clear with no dysarthria noted. There is no hypophonia. There is no lip, neck/head, jaw or voice tremor. Neck is supple with full range of passive and active motion. There are no carotid bruits on auscultation. Oropharynx exam reveals: mild mouth dryness, adequate dental hygiene and mild airway crowding, due to smaller airway entry and redundant soft palate. Mallampati is class II. Tonsils are absent.  Tongue protrudes centrally and palate elevates symmetrically. Neck size is 16.5 inches.   Chest: Clear to auscultation without wheezing, rhonchi or crackles noted.  Heart: S1+S2+0, regular and normal without murmurs, rubs or gallops noted.   Abdomen: Soft, non-tender and non-distended with normal bowel sounds appreciated on auscultation.  Extremities: There is no pitting edema in the distal lower extremities bilaterally. Pedal pulses are intact.  Skin: Warm and guite dry without trophic changes noted.   Musculoskeletal: exam reveals no obvious joint deformities, tenderness or joint swelling or erythema.   Neurologically:  Mental status: The patient is awake, alert and oriented in all 4 spheres. Her immediate and remote memory, attention, language skills and fund of knowledge are appropriate. There is no evidence of aphasia, agnosia, apraxia or anomia. Speech is clear with normal prosody and enunciation. Thought process is linear. Mood is normal and affect is normal.  Cranial nerves II - XII are as described above under HEENT exam. In addition: shoulder shrug is normal with equal shoulder height noted. Motor exam: Normal bulk, strength and tone is noted. There is no drift,  tremor or rebound. Romberg is negative. Reflexes are 1+ throughout. Fine motor skills and coordination: grossly intact.  Cerebellar testing: No dysmetria or intention tremor on finger to nose testing. Heel to shin is unremarkable bilaterally. There is no truncal or gait ataxia.  Sensory exam: intact to light touch in the upper and lower extremities.  Gait, station and balance: She stands easily. No veering to one side is noted. No leaning to one side is noted. Posture is age-appropriate and stance is narrow based. Gait shows normal stride length and normal pace. No problems turning are noted. Tandem walk is unremarkable.   Assessment and Plan:   In summary, Zoe Kim is a very pleasant 72 y.o.-year old female with an underlying medical history of anxiety, depression, arthritis, hyperactive bladder and obesity, who presents for sleep consultation. I suggested we proceed with sleep study testing to rule out an organic cause for her chronic sleep difficulties. She is agreeable. I explained in particular the risks and ramifications of untreated moderate to severe OSA, especially with respect to developing cardiovascular disease down the Road, including congestive heart failure, difficult to treat hypertension, cardiac arrhythmias, or stroke. Even type 2 diabetes has, in part, been linked to untreated OSA. Symptoms of untreated OSA include daytime sleepiness, memory problems, mood irritability and mood disorder such as depression and anxiety, lack of energy, as well as recurrent headaches, especially morning headaches. We talked about maintaining health sleep hygiene. She has tried prescription Belsomra recently. She may benefit from optimizing her treatment for anxiety and depression. She feels that her depression may be suboptimally controlled and has considered a change in her medication perhaps. She may benefit from counseling as well. Ultimately, for chronic depression, cognitive behavioral  therapy is most successful. I will likely see her back after sleep study testing. I answered all her questions today and she was in agreement with the plan.  Thank you very much for allowing me to participate in the care of this nice patient. If I can be of any further assistance to you please do not hesitate to call me at [phone].  Sincerely,   Star Age, MD, PhD

## 2017-06-05 NOTE — Patient Instructions (Addendum)
Based on your symptoms and your exam I believe we should look for an underlying organic sleep disorder, such as obstructive sleep apnea/OSA, or dream enactments, or periodic leg movement disorder (PLMD), which is leg twitching in sleep. While this is typically associated with symptoms of restless leg syndrome, patients can have periodic leg movements, that is, repetitive twitching in their sleep secondary to medication effects, particularly from taking an SSRI type antidepressant. Sometimes changing the antidepressant regimen can help. If this leg movement disorder disrupts your sleep, it can cause other problems such as daytime sleepiness. Please do not drive if you feel sleepy. Sometimes other conditions can cause leg twitching at night including thyroid dysfunction and iron deficiency. There are some symptomatic medications available for treatment. At this point, we should proceed with a sleep study to further delineate your sleep-related issues.   If you have more than mild OSA, I want you to consider treatment with CPAP. Please remember, the risks and ramifications of moderate to severe obstructive sleep apnea or OSA are: Cardiovascular disease, including congestive heart failure, stroke, difficult to control hypertension, arrhythmias, and even type 2 diabetes has been linked to untreated OSA. Sleep apnea causes disruption of sleep and sleep deprivation in most cases, which, in turn, can cause recurrent headaches, problems with memory, mood, concentration, focus, and vigilance. Most people with untreated sleep apnea report excessive daytime sleepiness, which can affect their ability to drive. Please do not drive if you feel sleepy.   I may see you back after your sleep study to go over the test results and where to go from there. We will call you after your sleep study to advise about the results (most likely, you will hear from Emery, my nurse) and to set up an appointment at the time.    Our sleep lab  administrative assistant will call you to schedule your sleep study. If you don't hear back from her by about 2 weeks from now, please feel free to call her at [phone]. This is her direct line and please leave a message with your phone number to call back if you get the voicemail box. She will call back as soon as possible.

## 2017-06-06 ENCOUNTER — Telehealth: Payer: Self-pay

## 2017-06-06 DIAGNOSIS — G479 Sleep disorder, unspecified: Secondary | ICD-10-CM

## 2017-06-06 DIAGNOSIS — R0683 Snoring: Secondary | ICD-10-CM

## 2017-06-06 NOTE — Telephone Encounter (Signed)
[employer] denied in-lab study, need HST order

## 2017-06-06 NOTE — Telephone Encounter (Signed)
HST order placed. 

## 2017-06-26 ENCOUNTER — Ambulatory Visit (INDEPENDENT_AMBULATORY_CARE_PROVIDER_SITE_OTHER): Payer: PPO | Admitting: Neurology

## 2017-06-26 DIAGNOSIS — G479 Sleep disorder, unspecified: Secondary | ICD-10-CM

## 2017-06-26 DIAGNOSIS — R0683 Snoring: Secondary | ICD-10-CM

## 2017-06-26 DIAGNOSIS — G4710 Hypersomnia, unspecified: Secondary | ICD-10-CM

## 2017-07-01 NOTE — Progress Notes (Signed)
Patient referred by Dr. Carlota Raspberry, seen by me on 06/12/17, HST on 06/26/17.   Please call and notify the patient that the recent home sleep test did not show any significant obstructive sleep apnea. Some snoring was noted, for which avoiding the supine sleep position and weight loss will likely suffice as treatment.  Patient can follow up with the referring provider. Please remind patient to try to maintain good sleep hygiene, which means: Keep a regular sleep and wake schedule and make enough time for sleep (7 1/2 to 8 1/2 hours for the average adult), try not to exercise or have a meal within 2 hours of your bedtime, try to keep your bedroom conducive for sleep, that is, cool and dark, without light distractors such as an illuminated alarm clock, and refrain from watching TV right before sleep or in the middle of the night and do not keep the TV or radio on during the night. If a nightlight is used, have it away from the visual field. Also, try not to use or play on electronic devices at bedtime, such as your cell phone, tablet PC or laptop. If you like to read at bedtime on an electronic device, try to dim the background light as much as possible. Do not eat in the middle of the night. Keep pets away from the bedroom environment. For stress relief, try meditation, deep breathing exercises (there are many books and CDs available), a white noise machine or fan can help to diffuse other noise distractors, such as traffic noise. Do not drink alcohol before bedtime, as it can disturb sleep and cause middle of the night awakenings. Never mix alcohol and sedating medications! Avoid narcotic pain medication close to bedtime, as opioids/narcotics can suppress breathing drive and breathing effort.    For chronic sleep maintenance issues, she may benefit from seeing psychiatrist, if stress, anxiety and depression may be at play, or psychologist for consideration of cognitive behavioral therapy. She should discuss with  PCP.  Thanks,  Star Age, MD, PhD [facility])

## 2017-07-01 NOTE — Procedures (Signed)
[facility] @[facility] [place], [place] [address] NAMEHanny Kim                                                          DOB: [birth year] MEDICAL RECORD NUMBER  [id number]                                                 DOS: 06/26/17 REFERRING PHYSICIAN: [name], MD STUDY PERFORMED: HST HISTORY: 72 year old woman with a history of anxiety, depression, arthritis, hyperactive bladder and obesity, who reports difficulty maintaining sleep. Epworth sleepiness score: 4/24. BMI:31.9  STUDY RESULTS: Total Recording Time: 8 hours, 26 minutes (valid testing time: 8 hours, 14 minutes) Total Apnea/Hypopnea Index (AHI):   1.2/h Average Oxygen Saturation:     93% Lowest Oxygen Desaturation:   81%  Total Time Oxygen Saturation Below or at 88%: 8 minutes  Average Heart Rate: 82 bpm  IMPRESSION: Snoring, sleep disturbance IMPRESSION: Snoring, daytime sleepiness RECOMMENDATION: This home sleep test does not demonstrate any significant obstructive or central sleep disordered breathing, overall AHI was below 5/hour. Some snoring was noted. Other causes of the patient's symptoms, including circadian rhythm disturbances, an underlying mood disorder, medication effect and/or an underlying medical problem cannot be ruled out based on this test. Clinical correlation is recommended. The patient should be cautioned not to drive, work at heights, or operate dangerous or heavy equipment when tired or sleepy. Review and reiteration of good sleep hygiene measures should be pursued with any patient. The patient and her referring provider will be notified of the test results.    I certify that I have reviewed the raw data recording prior to the issuance of this report in accordance with the standards of the American Academy of Sleep Medicine (AASM).  Star Age, MD, PhD Diplomat, ABPN (Neurology and Sleep)

## 2017-07-02 ENCOUNTER — Telehealth: Payer: Self-pay

## 2017-07-02 NOTE — Telephone Encounter (Signed)
-----   Message from Star Age, MD sent at 07/01/2017  8:49 AM EST ----- Patient referred by Dr. Carlota Raspberry, seen by me on 06/12/17, HST on 06/26/17.   Please call and notify the patient that the recent home sleep test did not show any significant obstructive sleep apnea. Some snoring was noted, for which avoiding the supine sleep position and weight loss will likely suffice as treatment.  Patient can follow up with the referring provider. Please remind patient to try to maintain good sleep hygiene, which means: Keep a regular sleep and wake schedule and make enough time for sleep (7 1/2 to 8 1/2 hours for the average adult), try not to exercise or have a meal within 2 hours of your bedtime, try to keep your bedroom conducive for sleep, that is, cool and dark, without light distractors such as an illuminated alarm clock, and refrain from watching TV right before sleep or in the middle of the night and do not keep the TV or radio on during the night. If a nightlight is used, have it away from the visual field. Also, try not to use or play on electronic devices at bedtime, such as your cell phone, tablet PC or laptop. If you like to read at bedtime on an electronic device, try to dim the background light as much as possible. Do not eat in the middle of the night. Keep pets away from the bedroom environment. For stress relief, try meditation, deep breathing exercises (there are many books and CDs available), a white noise machine or fan can help to diffuse other noise distractors, such as traffic noise. Do not drink alcohol before bedtime, as it can disturb sleep and cause middle of the night awakenings. Never mix alcohol and sedating medications! Avoid narcotic pain medication close to bedtime, as opioids/narcotics can suppress breathing drive and breathing effort.    For chronic sleep maintenance issues, she may benefit from seeing psychiatrist, if stress, anxiety and depression may be at play, or psychologist for  consideration of cognitive behavioral therapy. She should discuss with PCP.  Thanks,  Star Age, MD, PhD [facility])

## 2017-07-02 NOTE — Telephone Encounter (Signed)
I called pt. I advised pt that Dr. Rexene Alberts reviewed pt's sleep study and found that pt did not demonstrate any significant osa. Pt did have some snoring, for which pt should avoid supine sleep and pursue weight loss. Dr. Rexene Alberts recommends that pt follow up with Dr. Carlota Raspberry and pt may benefit from seeing a psychiatrist or psychologist for sleep maintenance issues. I reviewed sleep hygiene recommendations with the pt, including trying to keep a regular sleep wake schedule, avoiding electronics in the bedroom, keeping the bedroom cool, dark, and quiet, and avoiding eating or exercising within 2 hours of bedtime as well as eating in the middle of the night. I advised pt to keep pets out of the bedroom. I discussed with pt the importance of stress relief and to try meditation, deep breathing exercises, and/or a white noise machine or fan to diffuse other noise distracters. I advised pt to not drink alcohol before bedtime and to never mix alcohol and sedating medications. Pt was advised to avoid narcotic pain medication close to bedtime. I advised pt that a copy of these sleep study results will be sent to Dr. Carlota Raspberry. Pt verbalized understanding of results. Pt had no questions at this time but was encouraged to call back if questions arise.

## 2017-07-02 NOTE — Telephone Encounter (Signed)
I called pt to discuss. No answer, left a message asking her to call me back. 

## 2017-07-19 ENCOUNTER — Other Ambulatory Visit: Payer: Self-pay | Admitting: Family Medicine

## 2017-07-19 DIAGNOSIS — N3941 Urge incontinence: Secondary | ICD-10-CM

## 2017-07-19 NOTE — Telephone Encounter (Signed)
oxybutynin refill Last OV: 07/05/16 Last Refill:05/15/17 Pharmacy:[facility] [place]

## 2017-08-01 ENCOUNTER — Ambulatory Visit
Admission: RE | Admit: 2017-08-01 | Discharge: 2017-08-01 | Disposition: A | Discharge status: Home / Self Care | Payer: PPO | Source: Ambulatory Visit | Attending: Family Medicine | Admitting: Family Medicine

## 2017-08-01 DIAGNOSIS — M85852 Other specified disorders of bone density and structure, left thigh: Secondary | ICD-10-CM | POA: Diagnosis not present

## 2017-08-07 ENCOUNTER — Encounter: Payer: Self-pay | Admitting: Family Medicine

## 2017-08-18 ENCOUNTER — Other Ambulatory Visit: Payer: Self-pay | Admitting: Family Medicine

## 2017-08-18 DIAGNOSIS — N3941 Urge incontinence: Secondary | ICD-10-CM

## 2017-08-20 ENCOUNTER — Telehealth: Payer: Self-pay | Admitting: Family Medicine

## 2017-08-20 NOTE — Telephone Encounter (Signed)
LOV: 07/05/16   Dr. Carlota Raspberry   Pt requesting a 90 day supply of Ditropan. Pt states he is out of medication.

## 2017-08-20 NOTE — Telephone Encounter (Signed)
Copied from [place]. Topic: Quick Communication - Rx Refill/Question >> Aug 20, 2017  3:05 PM Waylan Rocher, Lumin L wrote: Medication: oxybutynin (DITROPAN) 5 MG tablet (90 day supply) out of medication Has the patient contacted their pharmacy? Yes.   (Agent: If no, request that the patient contact the pharmacy for the refill.) Preferred Pharmacy (with phone number or street name): [facility] [place], [place] - [address] [place] [address] [place] [address] [place] [address] Phone: [phone] Fax: [phone] Agent: Please be advised that RX refills may take up to 3 business days. We ask that you follow-up with your pharmacy.

## 2017-08-21 NOTE — Telephone Encounter (Signed)
Phone call to patient, unable to reach.  If patient calls back, please advise she needs office visit for refill of Ditropan and schedule office visit with Dr. Carlota Raspberry.

## 2017-08-27 ENCOUNTER — Ambulatory Visit (INDEPENDENT_AMBULATORY_CARE_PROVIDER_SITE_OTHER): Payer: PPO | Admitting: Family Medicine

## 2017-08-27 ENCOUNTER — Other Ambulatory Visit: Payer: Self-pay

## 2017-08-27 ENCOUNTER — Encounter: Payer: Self-pay | Admitting: Family Medicine

## 2017-08-27 VITALS — BP 132/81 | HR 90 | Temp 98.3°F | Resp 18 | Ht 65.35 in | Wt 195.2 lb

## 2017-08-27 DIAGNOSIS — F418 Other specified anxiety disorders: Secondary | ICD-10-CM | POA: Diagnosis not present

## 2017-08-27 DIAGNOSIS — N3941 Urge incontinence: Secondary | ICD-10-CM

## 2017-08-27 HISTORY — PX: TOOTH EXTRACTION: SHX859

## 2017-08-27 MED ORDER — OXYBUTYNIN CHLORIDE 5 MG PO TABS: ORAL_TABLET | ORAL | 1 refills | Status: DC

## 2017-08-27 MED ORDER — ESCITALOPRAM OXALATE 10 MG PO TABS
10.0000 mg | ORAL_TABLET | Freq: Every day | ORAL | 1 refills | Status: DC
Start: 2017-08-27 — End: 2017-11-07

## 2017-08-27 NOTE — Progress Notes (Signed)
Subjective:  By signing my name below, I, [name], attest that this documentation has been prepared under the direction and in the presence of [name], MD Electronically Signed: Ladene Artist, ED Scribe 08/27/2017 at 3:42 PM.   Patient ID: Zoe Kim, female    DOB: [birth year], 72 y.o.   MRN: [id number]  Chief Complaint  Patient presents with  . Depression    screening done  . Medication Refill    Fluoxetinehcl and Ditropan   HPI Zoe Kim is a 72 y.o. female who presents to Primary Care at [facility] for f/u.  Anxiety with Insomnia Las seen in Oct. Still taking Prozac at that time 40 mg qd and klonopin prn. Recommended Belsomra for sleep vs other options including Ambien. Referred for sleep apnea testing. Did not have any significant sleep apnea with AHI less than 5.  Pt ran out 1 wk ago. She has been taking 2 daily x 6 months without any relief. Reports she is having more depression symptoms than anxiety that she attributes to work. Denies side-effects, SI. Does report suicide attempt in her 26s but none since. She has been meeting with a psychologist for 2 months with some relief. 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  Urinary Incontinence Had previously taken 1/2 pil in the AM, whole pill in the afternoon of Ditropan. Denies lightheadedness, dizziness, dry mouth, fever, unexplained weight loss, hematuria. Pt states that she was taking 1 pill tid which improved symptoms some.  R Hand Pt reports recurrent "bump" to the R middle finger that bursts and oozes when it enlarges. States the area resolves but then grows again. She has not seen dermatology.  Patient Active Problem List   Diagnosis Date Noted  . Primary localized osteoarthritis of right knee 07/30/2016  . Anxiety   . Primary localized osteoarthrosis of the knee, right   . Urinary urgency    Past Medical History:  Diagnosis Date  . Anxiety   .  Arthritis   . Depression   . Heart murmur    "years ago"  . Insomnia   . Pneumonia    "long time ago"  . Primary localized osteoarthrosis of the knee, right   . Urinary urgency    Past Surgical History:  Procedure Laterality Date  . COLONOSCOPY    . EYE SURGERY Bilateral    cataract with lens  . TONSILLECTOMY    . TOOTH EXTRACTION Right 08/27/2017  . TOTAL KNEE ARTHROPLASTY Right 07/30/2016   Procedure: TOTAL KNEE ARTHROPLASTY;  Surgeon: Elsie Saas, MD;  Location: [place];  Service: Orthopedics;  Laterality: Right;  . VEIN LIGATION AND STRIPPING     Allergies  Allergen Reactions  . Sulfa Antibiotics     UNSPECIFIED REACTION    Prior to Admission medications   Medication Sig Start Date End Date Taking? Authorizing Provider  acetaminophen (TYLENOL) 500 MG tablet Take 1,000 mg by mouth every 6 (six) hours as needed for moderate pain.    [provider]  FLUoxetine (PROZAC) 20 MG tablet Take 2 tablets (40 mg total) by mouth daily. 01/10/17   [name], MD  MELATONIN PO Take 1 tablet by mouth at bedtime as needed (sleep).    [provider]  oxybutynin (DITROPAN) 5 MG tablet TAKE 1/2 TO 1 TABLET BY MOUTH THREE TIMES DAILY 07/19/17   [name], MD   Social History   Socioeconomic History  . Marital status:  Married    Spouse name: Not on file  . Number of children: 0  . Years of education: Not on file  . Highest education level: Bachelor's degree (e.g., BA, AB, BS)  Occupational History  . Not on file  Social Needs  . Financial resource strain: Not hard at all  . Food insecurity:    Worry: Never true    Inability: Never true  . Transportation needs:    Medical: No    Non-medical: No  Tobacco Use  . Smoking status: Never Smoker  . Smokeless tobacco: Never Used  . Tobacco comment: smoked 2 weeks in college  Substance and Sexual Activity  . Alcohol use: No  . Drug use: No  . Sexual activity: Yes  Lifestyle  . Physical activity:    Days  per week: 0 days    Minutes per session: 0 min  . Stress: Very much  Relationships  . Social connections:    Talks on phone: Never    Gets together: Never    Attends religious service: Never    Active member of club or organization: No    Attends meetings of clubs or organizations: Never    Relationship status: Married  . Intimate partner violence:    Fear of current or ex partner: No    Emotionally abused: No    Physically abused: No    Forced sexual activity: No  Other Topics Concern  . Not on file  Social History Narrative  . Not on file   Review of Systems  Constitutional: Negative for fever and unexpected weight change.  Endocrine: Negative for polydipsia.  Genitourinary: Positive for enuresis. Negative for hematuria.  Skin:       + Bump to R third finger  Neurological: Negative for dizziness and light-headedness.  Psychiatric/Behavioral: Positive for dysphoric mood. Negative for suicidal ideas.      Objective:   Physical Exam  Constitutional: She is oriented to person, place, and time. She appears well-developed and well-nourished. No distress.  HENT:  Head: Normocephalic and atraumatic.  Eyes: Conjunctivae and EOM are normal.  Neck: Neck supple. No tracheal deviation present.  Cardiovascular: Normal rate.  Pulmonary/Chest: Effort normal. No respiratory distress.  Musculoskeletal: Normal range of motion.  Neurological: She is alert and oriented to person, place, and time.  Skin: Skin is warm and dry.  R hand: Scaly area with circular appearance at distal 3rd finger proximal to the nail.  Psychiatric: She has a normal mood and affect. Her behavior is normal.  Nursing note and vitals reviewed.  Vitals:   08/27/17 1505  BP: 132/81  Pulse: 90  Resp: 18  Temp: 98.3 F (36.8 C)  TempSrc: Oral  SpO2: 97%  Weight: 195 lb 3.2 oz (88.5 kg)  Height: 5' 5.35" (1.66 m)      Assessment & Plan:   Zoe Kim is a 72 y.o. female Depression with anxiety -  Plan: escitalopram (LEXAPRO) 10 MG tablet  - try change of SSRI to lexapro, then consider increasing dose in 4-6 weeks. continue to meet with psychologist/therapist to discuss managing stressors. Also recommended exercise/activity.   - recheck in 4-6 weeks.   Urge incontinence - Plan: oxybutynin (DITROPAN) 5 MG tablet  - overall stable. Continue ditropan same dose, consider urology eval.   Plans to follow up to discuss area on finger. May need derm eval for bx/removal.   Meds ordered this encounter  Medications  . escitalopram (LEXAPRO) 10 MG tablet    Sig:  Take 1 tablet (10 mg total) by mouth daily.    Dispense:  90 tablet    Refill:  1  . oxybutynin (DITROPAN) 5 MG tablet    Sig: TAKE 1/2 TO 1 TABLET BY MOUTH THREE TIMES DAILY    Dispense:  270 tablet    Refill:  1   Patient Instructions   For depression, I would continue to meet with a psychologist. We can try a different antidepressant - Lexapro once per day, then can consider increasing to a higher dose in 4-6 weeks. Exercise with some form of activity most days per week when you can.   For incontinence  - continue same dose ditropan for now. At some point could consider evaluation with urologist if you have persistent symptoms.   I would like to discuss the area on your finger further next visit. Return to the clinic or go to the nearest emergency room if any of your symptoms worsen or new symptoms occur.    IF you received an x-ray today, you will receive an invoice from [facility]. Please contact [facility] at [phone] with questions or concerns regarding your invoice.   IF you received labwork today, you will receive an invoice from [place]. Please contact [facility] at [phone] with questions or concerns regarding your invoice.   Our billing staff will not be able to assist you with questions regarding bills from these companies.  You will be contacted with the lab results as soon as they are  available. The fastest way to get your results is to activate your My Chart account. Instructions are located on the last page of this paperwork. If you have not heard from [place] regarding the results in 2 weeks, please contact this office.      I personally performed the services described in this documentation, which was scribed in my presence. The recorded information has been reviewed and considered for accuracy and completeness, addended by me as needed, and agree with information above.  Signed,   [name], MD [facility] at [place].  08/31/17 12:16 AM

## 2017-08-27 NOTE — Patient Instructions (Addendum)
For depression, I would continue to meet with a psychologist. We can try a different antidepressant - Lexapro once per day, then can consider increasing to a higher dose in 4-6 weeks. Exercise with some form of activity most days per week when you can.   For incontinence  - continue same dose ditropan for now. At some point could consider evaluation with urologist if you have persistent symptoms.   I would like to discuss the area on your finger further next visit. Return to the clinic or go to the nearest emergency room if any of your symptoms worsen or new symptoms occur.    IF you received an x-ray today, you will receive an invoice from [facility]. Please contact [facility] at [phone] with questions or concerns regarding your invoice.   IF you received labwork today, you will receive an invoice from [place]. Please contact [facility] at [phone] with questions or concerns regarding your invoice.   Our billing staff will not be able to assist you with questions regarding bills from these companies.  You will be contacted with the lab results as soon as they are available. The fastest way to get your results is to activate your My Chart account. Instructions are located on the last page of this paperwork. If you have not heard from [place] regarding the results in 2 weeks, please contact this office.

## 2017-09-04 DIAGNOSIS — M9901 Segmental and somatic dysfunction of cervical region: Secondary | ICD-10-CM | POA: Diagnosis not present

## 2017-09-04 DIAGNOSIS — M47812 Spondylosis without myelopathy or radiculopathy, cervical region: Secondary | ICD-10-CM | POA: Diagnosis not present

## 2017-09-04 DIAGNOSIS — M5441 Lumbago with sciatica, right side: Secondary | ICD-10-CM | POA: Diagnosis not present

## 2017-09-04 DIAGNOSIS — M9903 Segmental and somatic dysfunction of lumbar region: Secondary | ICD-10-CM | POA: Diagnosis not present

## 2017-09-09 DIAGNOSIS — M47812 Spondylosis without myelopathy or radiculopathy, cervical region: Secondary | ICD-10-CM | POA: Diagnosis not present

## 2017-09-09 DIAGNOSIS — M9901 Segmental and somatic dysfunction of cervical region: Secondary | ICD-10-CM | POA: Diagnosis not present

## 2017-09-09 DIAGNOSIS — M5441 Lumbago with sciatica, right side: Secondary | ICD-10-CM | POA: Diagnosis not present

## 2017-09-09 DIAGNOSIS — M9903 Segmental and somatic dysfunction of lumbar region: Secondary | ICD-10-CM | POA: Diagnosis not present

## 2017-09-10 DIAGNOSIS — M47812 Spondylosis without myelopathy or radiculopathy, cervical region: Secondary | ICD-10-CM | POA: Diagnosis not present

## 2017-09-10 DIAGNOSIS — M9901 Segmental and somatic dysfunction of cervical region: Secondary | ICD-10-CM | POA: Diagnosis not present

## 2017-09-10 DIAGNOSIS — M5441 Lumbago with sciatica, right side: Secondary | ICD-10-CM | POA: Diagnosis not present

## 2017-09-10 DIAGNOSIS — M9903 Segmental and somatic dysfunction of lumbar region: Secondary | ICD-10-CM | POA: Diagnosis not present

## 2017-09-11 DIAGNOSIS — M9901 Segmental and somatic dysfunction of cervical region: Secondary | ICD-10-CM | POA: Diagnosis not present

## 2017-09-11 DIAGNOSIS — M47812 Spondylosis without myelopathy or radiculopathy, cervical region: Secondary | ICD-10-CM | POA: Diagnosis not present

## 2017-09-11 DIAGNOSIS — M9903 Segmental and somatic dysfunction of lumbar region: Secondary | ICD-10-CM | POA: Diagnosis not present

## 2017-09-11 DIAGNOSIS — M5441 Lumbago with sciatica, right side: Secondary | ICD-10-CM | POA: Diagnosis not present

## 2017-09-17 DIAGNOSIS — M9903 Segmental and somatic dysfunction of lumbar region: Secondary | ICD-10-CM | POA: Diagnosis not present

## 2017-09-17 DIAGNOSIS — M5441 Lumbago with sciatica, right side: Secondary | ICD-10-CM | POA: Diagnosis not present

## 2017-09-17 DIAGNOSIS — M47812 Spondylosis without myelopathy or radiculopathy, cervical region: Secondary | ICD-10-CM | POA: Diagnosis not present

## 2017-09-17 DIAGNOSIS — M9901 Segmental and somatic dysfunction of cervical region: Secondary | ICD-10-CM | POA: Diagnosis not present

## 2017-09-18 DIAGNOSIS — M9903 Segmental and somatic dysfunction of lumbar region: Secondary | ICD-10-CM | POA: Diagnosis not present

## 2017-09-18 DIAGNOSIS — M47812 Spondylosis without myelopathy or radiculopathy, cervical region: Secondary | ICD-10-CM | POA: Diagnosis not present

## 2017-09-18 DIAGNOSIS — M9901 Segmental and somatic dysfunction of cervical region: Secondary | ICD-10-CM | POA: Diagnosis not present

## 2017-09-18 DIAGNOSIS — M5441 Lumbago with sciatica, right side: Secondary | ICD-10-CM | POA: Diagnosis not present

## 2017-09-23 DIAGNOSIS — M9901 Segmental and somatic dysfunction of cervical region: Secondary | ICD-10-CM | POA: Diagnosis not present

## 2017-09-23 DIAGNOSIS — M5441 Lumbago with sciatica, right side: Secondary | ICD-10-CM | POA: Diagnosis not present

## 2017-09-23 DIAGNOSIS — M9903 Segmental and somatic dysfunction of lumbar region: Secondary | ICD-10-CM | POA: Diagnosis not present

## 2017-09-23 DIAGNOSIS — M47812 Spondylosis without myelopathy or radiculopathy, cervical region: Secondary | ICD-10-CM | POA: Diagnosis not present

## 2017-09-30 DIAGNOSIS — M9903 Segmental and somatic dysfunction of lumbar region: Secondary | ICD-10-CM | POA: Diagnosis not present

## 2017-09-30 DIAGNOSIS — M5441 Lumbago with sciatica, right side: Secondary | ICD-10-CM | POA: Diagnosis not present

## 2017-09-30 DIAGNOSIS — M47812 Spondylosis without myelopathy or radiculopathy, cervical region: Secondary | ICD-10-CM | POA: Diagnosis not present

## 2017-09-30 DIAGNOSIS — M9901 Segmental and somatic dysfunction of cervical region: Secondary | ICD-10-CM | POA: Diagnosis not present

## 2017-10-01 DIAGNOSIS — M47812 Spondylosis without myelopathy or radiculopathy, cervical region: Secondary | ICD-10-CM | POA: Diagnosis not present

## 2017-10-01 DIAGNOSIS — M9903 Segmental and somatic dysfunction of lumbar region: Secondary | ICD-10-CM | POA: Diagnosis not present

## 2017-10-01 DIAGNOSIS — M9901 Segmental and somatic dysfunction of cervical region: Secondary | ICD-10-CM | POA: Diagnosis not present

## 2017-10-01 DIAGNOSIS — M5441 Lumbago with sciatica, right side: Secondary | ICD-10-CM | POA: Diagnosis not present

## 2017-10-02 DIAGNOSIS — M5441 Lumbago with sciatica, right side: Secondary | ICD-10-CM | POA: Diagnosis not present

## 2017-10-02 DIAGNOSIS — M9903 Segmental and somatic dysfunction of lumbar region: Secondary | ICD-10-CM | POA: Diagnosis not present

## 2017-10-02 DIAGNOSIS — M9901 Segmental and somatic dysfunction of cervical region: Secondary | ICD-10-CM | POA: Diagnosis not present

## 2017-10-02 DIAGNOSIS — M47812 Spondylosis without myelopathy or radiculopathy, cervical region: Secondary | ICD-10-CM | POA: Diagnosis not present

## 2017-10-14 DIAGNOSIS — M47812 Spondylosis without myelopathy or radiculopathy, cervical region: Secondary | ICD-10-CM | POA: Diagnosis not present

## 2017-10-14 DIAGNOSIS — M9903 Segmental and somatic dysfunction of lumbar region: Secondary | ICD-10-CM | POA: Diagnosis not present

## 2017-10-14 DIAGNOSIS — M5441 Lumbago with sciatica, right side: Secondary | ICD-10-CM | POA: Diagnosis not present

## 2017-10-14 DIAGNOSIS — M9901 Segmental and somatic dysfunction of cervical region: Secondary | ICD-10-CM | POA: Diagnosis not present

## 2017-10-16 DIAGNOSIS — M47812 Spondylosis without myelopathy or radiculopathy, cervical region: Secondary | ICD-10-CM | POA: Diagnosis not present

## 2017-10-16 DIAGNOSIS — M5441 Lumbago with sciatica, right side: Secondary | ICD-10-CM | POA: Diagnosis not present

## 2017-10-16 DIAGNOSIS — M9901 Segmental and somatic dysfunction of cervical region: Secondary | ICD-10-CM | POA: Diagnosis not present

## 2017-10-16 DIAGNOSIS — M9903 Segmental and somatic dysfunction of lumbar region: Secondary | ICD-10-CM | POA: Diagnosis not present

## 2017-10-21 ENCOUNTER — Ambulatory Visit: Payer: PPO | Admitting: Family Medicine

## 2017-10-22 DIAGNOSIS — M47812 Spondylosis without myelopathy or radiculopathy, cervical region: Secondary | ICD-10-CM | POA: Diagnosis not present

## 2017-10-22 DIAGNOSIS — M5441 Lumbago with sciatica, right side: Secondary | ICD-10-CM | POA: Diagnosis not present

## 2017-10-22 DIAGNOSIS — M9901 Segmental and somatic dysfunction of cervical region: Secondary | ICD-10-CM | POA: Diagnosis not present

## 2017-10-22 DIAGNOSIS — M9903 Segmental and somatic dysfunction of lumbar region: Secondary | ICD-10-CM | POA: Diagnosis not present

## 2017-10-29 DIAGNOSIS — M47812 Spondylosis without myelopathy or radiculopathy, cervical region: Secondary | ICD-10-CM | POA: Diagnosis not present

## 2017-10-29 DIAGNOSIS — M9903 Segmental and somatic dysfunction of lumbar region: Secondary | ICD-10-CM | POA: Diagnosis not present

## 2017-10-29 DIAGNOSIS — M5441 Lumbago with sciatica, right side: Secondary | ICD-10-CM | POA: Diagnosis not present

## 2017-10-29 DIAGNOSIS — M9901 Segmental and somatic dysfunction of cervical region: Secondary | ICD-10-CM | POA: Diagnosis not present

## 2017-11-04 ENCOUNTER — Encounter: Payer: Self-pay | Admitting: Family Medicine

## 2017-11-06 ENCOUNTER — Ambulatory Visit: Payer: Self-pay | Admitting: *Deleted

## 2017-11-06 NOTE — Telephone Encounter (Signed)
Patient is calling to report that she is worried that she is having some cognitive decline. She has noticed more frequently that she has trouble getting the words that she wants to say out. She also has trouble remembering all the financial and names that she has to recall at times. Patient wants to make sure that she is able to function at her job. She also reports she has some numbness on the tops of her feet.  Reason for Disposition . [1] Numbness or tingling in one or both feet AND [2] is a chronic symptom (recurrent or ongoing AND present > 4 weeks)  Answer Assessment - Initial Assessment Questions 1. SYMPTOM: "What is the main symptom you are concerned about?" (e.g., weakness, numbness)     Memory and cognitive functioning is getting worse 2. ONSET: "When did this start?" (minutes, hours, days; while sleeping)     Patient states she has 1-2 good days a week- the rest of the time she feels out of it- struggling 3. LAST NORMAL: "When was the last time you were normal (no symptoms)?"     Works 11-12 hours/day  - feels significantly worse this year 4. PATTERN "Does this come and go, or has it been constant since it started?"  "Is it present now?"     Stressful work atmosphere- comes/ goes- patient has to remember  A lot of things- math, names 5. CARDIAC SYMPTOMS: "Have you had any of the following symptoms: chest pain, difficulty breathing, palpitations?"     no 6. NEUROLOGIC SYMPTOMS: "Have you had any of the following symptoms: headache, dizziness, vision loss, double vision, changes in speech, unsteady on your feet?"     More unsteady on feet- in the mornings patient notices she has a hard speaking- patient stutter and can not find the word. 7. OTHER SYMPTOMS: "Do you have any other symptoms?"     Patient has possible neuropathy on the top of feet 8. PREGNANCY: "Is there any chance you are pregnant?" "When was your last menstrual period?"     n/a  Protocols used: NEUROLOGIC  DEFICIT-A-AH

## 2017-11-06 NOTE — Telephone Encounter (Signed)
Spoke with Opal Sidles, RN pt scheduled OV to be seen tomorrow. No acute symptoms at this time per RN .

## 2017-11-07 ENCOUNTER — Ambulatory Visit (INDEPENDENT_AMBULATORY_CARE_PROVIDER_SITE_OTHER): Payer: PPO | Admitting: Family Medicine

## 2017-11-07 ENCOUNTER — Other Ambulatory Visit: Payer: Self-pay

## 2017-11-07 ENCOUNTER — Encounter: Payer: Self-pay | Admitting: Family Medicine

## 2017-11-07 VITALS — BP 128/74 | HR 94 | Temp 98.4°F | Resp 18 | Ht 65.35 in | Wt 195.4 lb

## 2017-11-07 DIAGNOSIS — F418 Other specified anxiety disorders: Secondary | ICD-10-CM | POA: Diagnosis not present

## 2017-11-07 MED ORDER — ESCITALOPRAM OXALATE 20 MG PO TABS: 20.0000 mg | ORAL_TABLET | Freq: Every day | ORAL | 1 refills | Status: DC

## 2017-11-07 NOTE — Patient Instructions (Addendum)
  Memory testing appears normal today.  I suspect some of your recall/memory issues may be related to stress/depression symptoms. I would recommend increasing Lexapro to 20 mg for now and discussing these symptoms further with your therapist.  Follow up for foot issues as we discussed.    IF you received an x-ray today, you will receive an invoice from [facility]. Please contact [facility] at [phone] with questions or concerns regarding your invoice.   IF you received labwork today, you will receive an invoice from [place] [facility]. Please contact [facility] at [phone] with questions or concerns regarding your invoice.   Our billing staff will not be able to assist you with questions regarding bills from these companies.  You will be contacted with the lab results as soon as they are available. The fastest way to get your results is to activate your My Chart account. Instructions are located on the last page of this paperwork. If you have not heard from [place] regarding the results in 2 weeks, please contact this office.

## 2017-11-07 NOTE — Progress Notes (Signed)
Subjective:  By signing my name below, I, [name], attest that this documentation has been prepared under the direction and in the presence of [name], MD Electronically Signed: Ladene Artist, ED Scribe 11/07/2017 at 12:24 PM.   Patient ID: Zoe Kim, female    DOB: November 05, 1945, 72 y.o.   MRN: [id number]  Chief Complaint  Patient presents with  . memory issues    pt states he is being forgetful and having memory issues   . Depression    screening was a 16    HPI Zoe Kim is a 72 y.o. female who presents to Primary Care at [facility] for f/u of depression with anxiety. Last seen 3/26. Anxiety with insomnia for some time. Treated with Prozac 40 mg and klonopin prn. Difficulty with sleep. She did have sleep study with [facility] less than 5. Was having more depression symptoms attributed to work stressors. Had been meeting with a psychiatrist for a few months with some relief. Wanting to change SSRIs so switched to Lexapro 10 mg qd initially with option to increase in 4-6 wks, continue to meet with therapist. Recommended daily exercise. - Pt states that she likes Lexapro better and feels "even" on the meds. States she's in a better mood in the morning on Lexapro 10 mg. Reports she has been sleeping better as well.  Depression screen [facility] 2/9 11/07/2017 08/27/2017 05/30/2017 03/18/2017 01/10/2017  Decreased Interest 3 3 1  0 1  Down, Depressed, Hopeless 3 3 1  0 3  PHQ - 2 Score 6 6 2  0 4  Altered sleeping 0 3 3 - 3  Tired, decreased energy 2 3 3  - 3  Change in appetite 3 3 3  - 1  Feeling bad or failure about yourself  3 3 3  - 3  Trouble concentrating 2 3 3  - 0  Moving slowly or fidgety/restless 0 0 0 - 0  Suicidal thoughts 0 0 0 - 0  PHQ-9 Score 16 21 17  - 14  Difficult doing work/chores - Very difficult Very difficult - Very difficult  Some recent data might be hidden    Memory Issues 6CIT Screen 11/07/2017 08/27/2017 05/30/2017  What Year? 0 points 0 points 0 points    What month? 0 points 0 points 0 points  What time? 0 points 0 points 0 points  Count back from 20 0 points 0 points 0 points  Months in reverse 0 points - 0 points  Repeat phrase 0 points 2 points 10 points  Total Score 0 - 10  Pt reports difficulty remembering names at work or giving directions at work. 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 Also states that she gets up to talk to someone at work about something, walks down the long hallway but by the time she gets to the next office, she has forgotten what she was going to talk to them about. Reports that details eventually return to her and she never has difficulty remembering faces. States she is very stressed with her job as she has not cashed a check in 2 yrs so attributes memory issues to ongoing stress. 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  Pain In Feet Pt reports intermittent burning, stinging pain to the tops of her feet. She plans to return to discuss this further.  Patient Active Problem List   Diagnosis Date Noted  . Primary localized osteoarthritis of right knee 07/30/2016  . Anxiety   . Primary localized osteoarthrosis of the knee, right   . Urinary urgency    Past Medical History:  Diagnosis Date  . Anxiety   . Arthritis   . Depression   . Heart murmur    "years ago"  . Insomnia   . Pneumonia    "long time ago"  . Primary localized osteoarthrosis of the knee, right   . Urinary urgency    Past Surgical History:  Procedure Laterality Date  . COLONOSCOPY    . EYE SURGERY Bilateral    cataract with lens  . TONSILLECTOMY    . TOOTH  EXTRACTION Right 08/27/2017  . TOTAL KNEE ARTHROPLASTY Right 07/30/2016   Procedure: TOTAL KNEE ARTHROPLASTY;  Surgeon: Elsie Saas, MD;  Location: [place];  Service: Orthopedics;  Laterality: Right;  . VEIN LIGATION AND STRIPPING     Allergies  Allergen Reactions  . Sulfa Antibiotics     UNSPECIFIED REACTION    Prior to Admission medications   Medication Sig Start Date End Date Taking? Authorizing Provider  acetaminophen (TYLENOL) 500 MG tablet Take 1,000 mg by mouth every 6 (six) hours as needed for moderate pain.    [provider]  amoxicillin (AMOXIL) 875 MG tablet Take 875 mg by mouth 2 (two) times daily.    [provider]  escitalopram (LEXAPRO) 10 MG tablet Take 1 tablet (10 mg total) by mouth daily. 08/27/17   [name], MD  FLUoxetine (PROZAC) 20 MG tablet Take 2 tablets (40 mg total) by mouth daily. 01/10/17   [name], MD  MELATONIN PO Take 1 tablet by mouth at bedtime as needed (sleep).    [provider]  naproxen sodium (ANAPROX) 550 MG tablet Take 550 mg by mouth 2 (two) times daily with a meal.    [provider]  oxybutynin (DITROPAN) 5 MG tablet TAKE 1/2 TO 1 TABLET BY MOUTH THREE TIMES DAILY 08/27/17   [name], MD   Social History   Socioeconomic History  . Marital status: Married    Spouse name: Not on file  . Number of children: 0  . Years of education: Not on file  . Highest education level: Bachelor's degree (e.g., BA, AB, BS)  Occupational History  . Not on file  Social Needs  . Financial resource strain: Not hard at all  . Food insecurity:    Worry: Never true    Inability: Never true  . Transportation needs:    Medical: No    Non-medical: No  Tobacco Use  . Smoking status: Never Smoker  . Smokeless tobacco: Never Used  . Tobacco comment: smoked 2 weeks in college  Substance and Sexual Activity  . Alcohol use: No  . Drug use: No  . Sexual activity: Yes  Lifestyle  . Physical activity:      Days per week: 0 days    Minutes per session: 0 min  . Stress: Very much  Relationships  . Social connections:    Talks on phone: Never    Gets together: Never    Attends religious service: Never  Active member of club or organization: No    Attends meetings of clubs or organizations: Never    Relationship status: Married  . Intimate partner violence:    Fear of current or ex partner: No    Emotionally abused: No    Physically abused: No    Forced sexual activity: No  Other Topics Concern  . Not on file  Social History Narrative  . Not on file   Review of Systems  Psychiatric/Behavioral: Positive for dysphoric mood. Negative for sleep disturbance. The patient is nervous/anxious.      Objective:   Physical Exam  Constitutional: She is oriented to person, place, and time. She appears well-developed and well-nourished. No distress.  HENT:  Head: Normocephalic and atraumatic.  Eyes: Conjunctivae and EOM are normal.  Neck: Neck supple. No tracheal deviation present.  Cardiovascular: Normal rate and regular rhythm.  Pulmonary/Chest: Effort normal and breath sounds normal. No respiratory distress.  Musculoskeletal: Normal range of motion.  Neurological: She is alert and oriented to person, place, and time.  Skin: Skin is warm and dry.  Psychiatric: She has a normal mood and affect. Her behavior is normal.  Nursing note and vitals reviewed.  Vitals:   11/07/17 1204  BP: 128/74  Pulse: 94  Resp: 18  Temp: 98.4 F (36.9 C)  TempSrc: Oral  SpO2: 94%  Weight: 195 lb 6.4 oz (88.6 kg)  Height: 5' 5.35" (1.66 m)      Assessment & Plan:   Zoe Kim is a 72 y.o. female Depression with anxiety - Plan: escitalopram (LEXAPRO) 20 MG tablet Suspected depression/anxiety, stressors contributing to memory symptoms.  Memory testing was reassuring.  -Increase Lexapro to 20 mg daily, if she is intolerant to that dose, can return to 10 mg and advised me for further  refills.  Would recommend discussion of current stressors and management of the stressors with her therapist.  Option of meeting with neurologist to discuss memory given as well. Recheck in 3 months for depression/anxiety, but can follow-up sooner to discuss foot symptoms that were briefly mentioned at end of the visit.  Meds ordered this encounter  Medications  . escitalopram (LEXAPRO) 20 MG tablet    Sig: Take 1 tablet (20 mg total) by mouth daily.    Dispense:  90 tablet    Refill:  1   Patient Instructions    Memory testing appears normal today.  I suspect some of your recall/memory issues may be related to stress/depression symptoms. I would recommend increasing Lexapro to 20 mg for now and discussing these symptoms further with your therapist.  Follow up for foot issues as we discussed.    IF you received an x-ray today, you will receive an invoice from [facility]. Please contact [facility] at [phone] with questions or concerns regarding your invoice.   IF you received labwork today, you will receive an invoice from [place]. Please contact [facility] at [phone] with questions or concerns regarding your invoice.   Our billing staff will not be able to assist you with questions regarding bills from these companies.  You will be contacted with the lab results as soon as they are available. The fastest way to get your results is to activate your My Chart account. Instructions are located on the last page of this paperwork. If you have not heard from [place] regarding the results in 2 weeks, please contact this office.      I personally performed the services described in this documentation,  which was scribed in my presence. The recorded information has been reviewed and considered for accuracy and completeness, addended by me as needed, and agree with information above.  Signed,   [name], MD Primary Care at [place].    11/07/17 1:25 PM

## 2017-11-14 DIAGNOSIS — H26491 Other secondary cataract, right eye: Secondary | ICD-10-CM | POA: Diagnosis not present

## 2017-11-14 DIAGNOSIS — H35371 Puckering of macula, right eye: Secondary | ICD-10-CM | POA: Diagnosis not present

## 2017-11-14 DIAGNOSIS — H524 Presbyopia: Secondary | ICD-10-CM | POA: Diagnosis not present

## 2017-11-22 ENCOUNTER — Other Ambulatory Visit: Payer: Self-pay | Admitting: Family Medicine

## 2018-01-29 DIAGNOSIS — H26492 Other secondary cataract, left eye: Secondary | ICD-10-CM | POA: Diagnosis not present

## 2018-02-15 ENCOUNTER — Other Ambulatory Visit: Payer: Self-pay | Admitting: Family Medicine

## 2018-02-15 DIAGNOSIS — N3941 Urge incontinence: Secondary | ICD-10-CM

## 2018-03-04 DIAGNOSIS — R413 Other amnesia: Secondary | ICD-10-CM | POA: Diagnosis not present

## 2018-03-04 DIAGNOSIS — F321 Major depressive disorder, single episode, moderate: Secondary | ICD-10-CM | POA: Diagnosis not present

## 2018-03-04 DIAGNOSIS — F419 Anxiety disorder, unspecified: Secondary | ICD-10-CM | POA: Diagnosis not present

## 2018-03-06 ENCOUNTER — Encounter: Payer: Self-pay | Admitting: Neurology

## 2018-05-02 ENCOUNTER — Other Ambulatory Visit: Payer: Self-pay | Admitting: Family Medicine

## 2018-05-02 DIAGNOSIS — F418 Other specified anxiety disorders: Secondary | ICD-10-CM

## 2018-05-16 ENCOUNTER — Other Ambulatory Visit: Payer: Self-pay | Admitting: Family Medicine

## 2018-05-16 DIAGNOSIS — N3941 Urge incontinence: Secondary | ICD-10-CM

## 2018-05-19 ENCOUNTER — Other Ambulatory Visit: Payer: Self-pay | Admitting: Family Medicine

## 2018-05-19 DIAGNOSIS — F418 Other specified anxiety disorders: Secondary | ICD-10-CM

## 2018-05-20 NOTE — Telephone Encounter (Signed)
Refill request for escitalopram; pt's last office visit 11/07/17; no upcoming visits noted; contacted pt; pt offered and accepted appointment with Dr [name], [address]; 06/11/18 at 1440; she verbalized understanding; will grant refill to cover pt until this upcoming appointment.  Requested Prescriptions  Pending Prescriptions Disp Refills  . escitalopram (LEXAPRO) 20 MG tablet [Pharmacy Med Name: ESCITALOPRAM 20MG  TABLETS] 90 tablet 1    Sig: TAKE 1 TABLET BY MOUTH EVERY DAY     Psychiatry:  Antidepressants - SSRI Failed - 05/19/2018  1:23 PM      Failed - Valid encounter within last 6 months    Recent Outpatient Visits          6 months ago Depression with anxiety   [facility] at [place], Ranell Patrick, MD   8 months ago Depression with anxiety   [facility], Ranell Patrick, MD   1 year ago Psychophysiological insomnia   [facility] at [place], MD   1 year ago Insomnia, unspecified type   [facility] at [facility], Ranell Patrick, MD   1 year ago Situational anxiety   [facility] at [facility], Ranell Patrick, MD      Future Appointments            In 3 weeks Zoe Kim Ranell Patrick, MD Primary Care at [place], [facility]

## 2018-06-02 ENCOUNTER — Ambulatory Visit: Payer: PPO | Admitting: Neurology

## 2018-06-11 ENCOUNTER — Ambulatory Visit (INDEPENDENT_AMBULATORY_CARE_PROVIDER_SITE_OTHER): Payer: PPO | Admitting: Family Medicine

## 2018-06-11 ENCOUNTER — Other Ambulatory Visit: Payer: Self-pay

## 2018-06-11 ENCOUNTER — Encounter: Payer: Self-pay | Admitting: Family Medicine

## 2018-06-11 VITALS — BP 128/75 | HR 91 | Temp 99.2°F | Resp 14 | Ht 65.5 in | Wt 194.6 lb

## 2018-06-11 DIAGNOSIS — F418 Other specified anxiety disorders: Secondary | ICD-10-CM | POA: Diagnosis not present

## 2018-06-11 DIAGNOSIS — N3941 Urge incontinence: Secondary | ICD-10-CM

## 2018-06-11 DIAGNOSIS — Z1211 Encounter for screening for malignant neoplasm of colon: Secondary | ICD-10-CM | POA: Diagnosis not present

## 2018-06-11 DIAGNOSIS — Z23 Encounter for immunization: Secondary | ICD-10-CM

## 2018-06-11 MED ORDER — OXYBUTYNIN CHLORIDE 5 MG PO TABS: 2.5000 mg | ORAL_TABLET | Freq: Three times a day (TID) | ORAL | 1 refills | Status: DC | PRN

## 2018-06-11 MED ORDER — ESCITALOPRAM OXALATE 20 MG PO TABS: 20.0000 mg | ORAL_TABLET | Freq: Every day | ORAL | 2 refills | Status: DC

## 2018-06-11 NOTE — Progress Notes (Signed)
Subjective:    Patient ID: Zoe Kim, female    DOB: 1945-11-08, 73 y.o.   MRN: [id number]  HPI Zoe Kim is a 73 y.o. female Presents today for: Chief Complaint  Patient presents with  . Depression    with anxiety follow up to get refills on medication   Depression with anxiety.  No longer with [place] [employer] since November. Initially angry, but doing better now.  Sleeping better now - 6-7 hrs/night. Enjoying retirement. 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  Plans on exercise at [facility]. 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  Feels more relaxed, would like to continue same dose lexapro.   Taking 1/2-1 oxybutynin tid without new side effects.  Helping with urinary incontinence. Prior constipation when working - resolved now.   HM: Agrees to referral for colonoscopy and pneumovax today.   Patient Active Problem List   Diagnosis Date Noted  . Primary localized osteoarthritis of right knee 07/30/2016  . Anxiety   . Primary localized osteoarthrosis of the knee, right   . Urinary urgency    Past Medical History:  Diagnosis Date  . Anxiety   . Arthritis   . Depression   . Heart murmur    "years ago"  . Insomnia   . Pneumonia    "long time ago"  . Primary localized osteoarthrosis of the knee, right   . Urinary urgency    Past Surgical History:  Procedure Laterality Date  . COLONOSCOPY    . EYE SURGERY Bilateral    cataract with lens  . TONSILLECTOMY    . TOOTH EXTRACTION Right 08/27/2017  . TOTAL KNEE ARTHROPLASTY Right 07/30/2016   Procedure: TOTAL KNEE ARTHROPLASTY;  Surgeon: Elsie Saas, MD;  Location: [place];  Service: Orthopedics;  Laterality: Right;  . VEIN LIGATION AND STRIPPING     Allergies  Allergen Reactions  . Sulfa Antibiotics     UNSPECIFIED REACTION    Prior to Admission medications   Medication Sig Start Date End Date Taking? Authorizing Provider  acetaminophen (TYLENOL) 500 MG tablet Take 1,000  mg by mouth every 6 (six) hours as needed for moderate pain.   Yes [provider]  escitalopram (LEXAPRO) 20 MG tablet TAKE 1 TABLET BY MOUTH EVERY DAY 05/20/18  Yes [name], MD  MELATONIN PO Take 1 tablet by mouth at bedtime as needed (sleep).   Yes [provider]  oxybutynin (DITROPAN) 5 MG tablet TAKE 1/2 TO 1 TABLET BY MOUTH THREE TIMES DAILY 05/16/18  Yes [name], MD  FLUoxetine (PROZAC) 20 MG tablet TAKE 1 TABLET BY MOUTH EVERY DAY 11/23/17   [name], MD   Social History   Socioeconomic History  . Marital status: Married    Spouse name: Not on file  . Number of children: 0  . Years of education: Not on file  . Highest education level: Bachelor's degree (e.g., BA, AB, BS)  Occupational History  . Not on file  Social Needs  . Financial resource strain: Not hard at all  . Food insecurity:    Worry: Never true    Inability: Never true  . Transportation needs:    Medical: No    Non-medical: No  Tobacco Use  . Smoking status: Never Smoker  . Smokeless tobacco: Never Used  . Tobacco comment: smoked 2 weeks in college  Substance and Sexual Activity  . Alcohol use: No  . Drug use: No  .  Sexual activity: Yes  Lifestyle  . Physical activity:    Days per week: 0 days    Minutes per session: 0 min  . Stress: Very much  Relationships  . Social connections:    Talks on phone: Never    Gets together: Never    Attends religious service: Never    Active member of club or organization: No    Attends meetings of clubs or organizations: Never    Relationship status: Married  . Intimate partner violence:    Fear of current or ex partner: No    Emotionally abused: No    Physically abused: No    Forced sexual activity: No  Other Topics Concern  . Not on file  Social History Narrative  . Not on file     Review of Systems     Objective:   Physical Exam Vitals signs reviewed.  Constitutional:      Appearance: She is  well-developed.  HENT:     Head: Normocephalic and atraumatic.  Eyes:     Conjunctiva/sclera: Conjunctivae normal.     Pupils: Pupils are equal, round, and reactive to light.  Neck:     Vascular: No carotid bruit.  Cardiovascular:     Rate and Rhythm: Normal rate and regular rhythm.     Heart sounds: Normal heart sounds.  Pulmonary:     Effort: Pulmonary effort is normal.     Breath sounds: Normal breath sounds.  Abdominal:     Palpations: There is no pulsatile mass.  Skin:    General: Skin is warm and dry.  Neurological:     Mental Status: She is alert and oriented to person, place, and time.  Psychiatric:        Behavior: Behavior normal.    Vitals:   06/11/18 1505  BP: 128/75  Pulse: 91  Resp: 14  Temp: 99.2 F (37.3 C)  TempSrc: Oral  SpO2: 97%  Weight: 194 lb 9.6 oz (88.3 kg)  Height: 5' 5.5" (1.664 m)          Assessment & Plan:   Zoe Kim is a 73 y.o. female Depression with anxiety - Plan: escitalopram (LEXAPRO) 20 MG tablet  -Stable/improved with recent occupational change.  Denies any new side effects, continue Lexapro same dose.  Screen for colon cancer - Plan: Ambulatory referral to Gastroenterology  Need for prophylactic vaccination against Streptococcus pneumoniae (pneumococcus) - Plan: Pneumococcal conjugate vaccine 13-valent IM  Urge incontinence - Plan: oxybutynin (DITROPAN) 5 MG tablet  -Constipation prevention discussed, continue Ditropan 1/2-1 as needed throughout the day.  Tolerating currently.  Meds ordered this encounter  Medications  . escitalopram (LEXAPRO) 20 MG tablet    Sig: Take 1 tablet (20 mg total) by mouth daily.    Dispense:  90 tablet    Refill:  2  . oxybutynin (DITROPAN) 5 MG tablet    Sig: Take 0.5-1 tablets (2.5-5 mg total) by mouth every 8 (eight) hours as needed for bladder spasms.    Dispense:  270 tablet    Refill:  1   Patient Instructions     Thanks for coming in today.  Follow-up within 6  months for wellness exam.  I will refer you for colonoscopy during that time, and first of 2 pneumonia vaccines were given today.     If you have lab work done today you will be contacted with your lab results within the next 2 weeks.  If you have not heard from [place]  then please contact us. The fastest way to get your results is to register for My Chart.   IF you received an x-ray today, you will receive an invoice from [facility]. Please contact [facility] at [phone] with questions or concerns regarding your invoice.   IF you received labwork today, you will receive an invoice from [place]. Please contact [facility] at [phone] with questions or concerns regarding your invoice.   Our billing staff will not be able to assist you with questions regarding bills from these companies.  You will be contacted with the lab results as soon as they are available. The fastest way to get your results is to activate your My Chart account. Instructions are located on the last page of this paperwork. If you have not heard from [place] regarding the results in 2 weeks, please contact this office.       Signed,   [name], MD Primary Care at [place].  06/14/18 3:19 PM

## 2018-06-11 NOTE — Patient Instructions (Addendum)
   Thanks for coming in today.  Follow-up within 6 months for wellness exam.  I will refer you for colonoscopy during that time, and first of 2 pneumonia vaccines were given today.     If you have lab work done today you will be contacted with your lab results within the next 2 weeks.  If you have not heard from [place] then please contact us. The fastest way to get your results is to register for My Chart.   IF you received an x-ray today, you will receive an invoice from [facility]. Please contact [facility] at [phone] with questions or concerns regarding your invoice.   IF you received labwork today, you will receive an invoice from [place]. Please contact [facility] at [phone] with questions or concerns regarding your invoice.   Our billing staff will not be able to assist you with questions regarding bills from these companies.  You will be contacted with the lab results as soon as they are available. The fastest way to get your results is to activate your My Chart account. Instructions are located on the last page of this paperwork. If you have not heard from [place] regarding the results in 2 weeks, please contact this office.

## 2018-06-14 ENCOUNTER — Encounter: Payer: Self-pay | Admitting: Family Medicine

## 2018-07-23 ENCOUNTER — Telehealth: Payer: Self-pay

## 2018-07-23 NOTE — Telephone Encounter (Signed)
Pt was a no show this morning. Pt verbalize her husband is in [place] with his sick 74 year old mother., Pt verbalize she wanted to cancel all appointments until her husband returns back home. Given her the number here and doctor name.

## 2018-08-01 ENCOUNTER — Encounter: Payer: PPO | Admitting: Gastroenterology

## 2018-08-27 ENCOUNTER — Telehealth: Payer: Self-pay | Admitting: *Deleted

## 2018-08-27 NOTE — Telephone Encounter (Signed)
Left message to schedule AWV   (Telemed)

## 2018-09-10 ENCOUNTER — Ambulatory Visit (INDEPENDENT_AMBULATORY_CARE_PROVIDER_SITE_OTHER): Payer: PPO | Admitting: Family Medicine

## 2018-09-10 ENCOUNTER — Other Ambulatory Visit: Payer: Self-pay

## 2018-09-10 ENCOUNTER — Telehealth (INDEPENDENT_AMBULATORY_CARE_PROVIDER_SITE_OTHER): Payer: PPO | Admitting: Family Medicine

## 2018-09-10 DIAGNOSIS — L84 Corns and callosities: Secondary | ICD-10-CM | POA: Diagnosis not present

## 2018-09-10 DIAGNOSIS — R319 Hematuria, unspecified: Secondary | ICD-10-CM

## 2018-09-10 DIAGNOSIS — G609 Hereditary and idiopathic neuropathy, unspecified: Secondary | ICD-10-CM | POA: Diagnosis not present

## 2018-09-10 DIAGNOSIS — I83899 Varicose veins of unspecified lower extremities with other complications: Secondary | ICD-10-CM | POA: Diagnosis not present

## 2018-09-10 DIAGNOSIS — R21 Rash and other nonspecific skin eruption: Secondary | ICD-10-CM | POA: Diagnosis not present

## 2018-09-10 DIAGNOSIS — M79676 Pain in unspecified toe(s): Secondary | ICD-10-CM | POA: Diagnosis not present

## 2018-09-10 DIAGNOSIS — B351 Tinea unguium: Secondary | ICD-10-CM | POA: Diagnosis not present

## 2018-09-10 DIAGNOSIS — N950 Postmenopausal bleeding: Secondary | ICD-10-CM

## 2018-09-10 LAB — POCT URINALYSIS DIP (MANUAL ENTRY)
Bilirubin, UA: NEGATIVE
Blood, UA: NEGATIVE
Glucose, UA: NEGATIVE mg/dL
Ketones, POC UA: NEGATIVE mg/dL
Leukocytes, UA: NEGATIVE
Nitrite, UA: NEGATIVE
Protein Ur, POC: NEGATIVE mg/dL
Spec Grav, UA: 1.02 (ref 1.010–1.025)
Urobilinogen, UA: 0.2 E.U./dL
pH, UA: 7.5 (ref 5.0–8.0)

## 2018-09-10 LAB — POC MICROSCOPIC URINALYSIS (UMFC): Mucus: ABSENT

## 2018-09-10 NOTE — Progress Notes (Signed)
Pt states she has been having spotting x 6 months on and off. Also pt states she notice a rash in the vaginal area x 10 days that has been clearing up but still visible. Pt states she has not be having any urinary issues at this time.

## 2018-09-10 NOTE — Patient Instructions (Signed)
Pt came into the office today to drop off urine for further testing.

## 2018-09-10 NOTE — Patient Instructions (Signed)
I am glad to hear the rash is improving.  It does sound like a possible allergic cause.  Over-the-counter Claritin, Allegra, or Zyrtec can be used if needed, or cortisone cream a few times per day to itching areas but if that is not improving in the next 5 to 7 days or worsening sooner, please schedule video visit.  Depending on urine test today can decide if symptoms may be from infection.  However I will refer you to gynecology for suspected postmenopausal bleeding.  Please let me know if there are questions.  Return to the clinic or go to the nearest emergency room if any of your symptoms worsen or new symptoms occur.   Postmenopausal Bleeding  Postmenopausal bleeding is any bleeding that happens after menopause. Menopause is when a woman's period stops. Any type of bleeding after menopause should be checked by your doctor. Treatment will depend on the cause. Follow these instructions at home:  Pay attention to any changes in your symptoms.  Avoid using tampons and douches as told by your doctor.  Change your pads regularly.  Get regular pelvic exams and Pap tests.  Take iron pills as told by your doctor.  Take over-the-counter and prescription medicines only as told by your doctor.  Keep all follow-up visits as told by your doctor. This is important. Contact a doctor if:  Your bleeding lasts for more than 1 week.  You have belly (abdominal) pain.  You have bleeding during or after sex (intercourse).  You have bleeding that happens more often than every 3 weeks. Get help right away if:  You have a fever, chills, a headache, dizziness, muscle aches, and bleeding.  You have strong pain with bleeding.  You have clumps of blood (blood clots) coming from your vagina.  You have a lot of bleeding and: ? You need more than 1 pad an hour. ? This has never happened before.  You feel like you are going to pass out (faint). Summary  Any type of bleeding after menopause should  be checked by your doctor.  Pay attention to any changes in your symptoms.  Keep all follow-up visits as told by your doctor. This information is not intended to replace advice given to you by your health care provider. Make sure you discuss any questions you have with your health care provider. Document Released: 02/28/2008 Document Revised: 06/26/2016 Document Reviewed: 06/26/2016 Elsevier Interactive Patient Education  2019 [employer]  Rash, Adult A rash is a change in the color of your skin. A rash can also change the way your skin feels. There are many different conditions and factors that can cause a rash. Some rashes may disappear after a few days, but some may last for a few weeks. Common causes of rashes include:  Viral infections, such as: ? Colds. ? Measles. ? Hand, foot, and mouth disease.  Bacterial infections, such as: ? Scarlet fever. ? Impetigo.  Fungal infections, such as Candida.  Allergic reactions to food, medicines, or skin care products. Follow these instructions at home: The goal of treatment is to stop the itching and keep the rash from spreading. Pay attention to any changes in your symptoms. Follow these instructions to help with your condition: Medicine Take or apply over-the-counter and prescription medicines only as told by your health care provider. These may include:  Corticosteroid creams to treat red or swollen skin.  Anti-itch lotions.  Oral allergy medicines (antihistamines).  Oral corticosteroids for severe symptoms.  Skin care  Apply  cool compresses to the affected areas.  Do not scratch or rub your skin.  Avoid covering the rash. Make sure the rash is exposed to air as much as possible. Managing itching and discomfort  Avoid hot showers or baths, which can make itching worse. A cold shower may help.  Try taking a bath with: ? Epsom salts. Follow manufacturer instructions on the packaging. You can get these at your local  pharmacy or grocery store. ? Baking soda. Pour a small amount into the bath as told by your health care provider. ? Colloidal oatmeal. Follow manufacturer instructions on the packaging. You can get this at your local pharmacy or grocery store.  Try applying baking soda paste to your skin. Stir water into baking soda until it reaches a paste-like consistency.  Try applying calamine lotion. This is an over-the-counter lotion that helps to relieve itchiness.  Keep cool and out of the sun. Sweating and being hot can make itching worse. General instructions   Rest as needed.  Drink enough fluid to keep your urine pale yellow.  Wear loose-fitting clothing.  Avoid scented soaps, detergents, and perfumes. Use gentle soaps, detergents, perfumes, and other cosmetic products.  Avoid any substance that causes your rash. Keep a journal to help track what causes your rash. Write down: ? What you eat. ? What cosmetic products you use. ? What you drink. ? What you wear. This includes jewelry.  Keep all follow-up visits as told by your health care provider. This is important. Contact a health care provider if:  You sweat at night.  You lose weight.  You urinate more than normal.  You urinate less than normal, or you notice that your urine is a darker color than usual.  You feel weak.  You vomit.  Your skin or the whites of your eyes look yellow (jaundice).  Your skin: ? Tingles. ? Is numb.  Your rash: ? Does not go away after several days. ? Gets worse.  You are: ? Unusually thirsty. ? More tired than normal.  You have: ? New symptoms. ? Pain in your abdomen. ? A fever. ? Diarrhea. Get help right away if you:  Have a fever and your symptoms suddenly get worse.  Develop confusion.  Have a severe headache or a stiff neck.  Have severe joint pains or stiffness.  Have a seizure.  Develop a rash that covers all or most of your body. The rash may or may not be  painful.  Develop blisters that: ? Are on top of the rash. ? Grow larger or grow together. ? Are painful. ? Are inside your nose or mouth.  Develop a rash that: ? Looks like purple pinprick-sized spots all over your body. ? Has a "bull's eye" or looks like a target. ? Is not related to sun exposure, is red and painful, and causes your skin to peel. Summary  A rash is a change in the color of your skin. Some rashes disappear after a few days, but some may last for a few weeks.  The goal of treatment is to stop the itching and keep the rash from spreading.  Take or apply over-the-counter and prescription medicines only as told by your health care provider.  Contact a health care provider if you have new or worsening symptoms.  Keep all follow-up visits as told by your health care provider. This is important. This information is not intended to replace advice given to you by your health care provider. Make  sure you discuss any questions you have with your health care provider. Document Released: 05/11/2002 Document Revised: 12/23/2017 Document Reviewed: 12/23/2017 Elsevier Interactive Patient Education  2019 [employer].

## 2018-09-10 NOTE — Progress Notes (Signed)
Virtual Visit via Telephone Note  I connected with Zoe Kim on 09/10/18 at 9:15 AM by telephone and verified that I am speaking with the correct person using two identifiers.   I discussed the limitations, risks, security and privacy concerns of performing an evaluation and management service by telephone and the availability of in person appointments. I also discussed with the patient that there may be a patient responsible charge related to this service. The patient expressed understanding and agreed to proceed, consent obtained  Chief complaint: vaginal bleeding.   History of Present Illness:  Vaginal bleeding:  Postmenopausal.  Notices pink d/c, spotting after wiping with urination about every 6 weeks,  but more frequently now past 10 days - daily.  No new dysuria. Has had some ongoing urinary frequency, but stopped ditropan (stopped meds). Few mins per day has minimal feeling of menstrual pain, but mild. Min spotting in underwear b/t urination. No fever, no back pain.   Rash: Rash noted at base of neck moves all the way down to buttocks, and into vaginal area, thinks allergy of some type, itchy, but improving. Tx: none.   Sleeping better off lexapro. No SI, not typically feeling depressed.     Patient Active Problem List   Diagnosis Date Noted  . Primary localized osteoarthritis of right knee 07/30/2016  . Anxiety   . Primary localized osteoarthrosis of the knee, right   . Urinary urgency    Past Medical History:  Diagnosis Date  . Anxiety   . Arthritis   . Depression   . Heart murmur    "years ago"  . Insomnia   . Pneumonia    "long time ago"  . Primary localized osteoarthrosis of the knee, right   . Urinary urgency    Past Surgical History:  Procedure Laterality Date  . COLONOSCOPY    . EYE SURGERY Bilateral    cataract with lens  . TONSILLECTOMY    . TOOTH EXTRACTION Right 08/27/2017  . TOTAL KNEE ARTHROPLASTY Right 07/30/2016   Procedure:  TOTAL KNEE ARTHROPLASTY;  Surgeon: Elsie Saas, MD;  Location: [facility];  Service: Orthopedics;  Laterality: Right;  . VEIN LIGATION AND STRIPPING     Allergies  Allergen Reactions  . Sulfa Antibiotics     UNSPECIFIED REACTION    Prior to Admission medications   Medication Sig Start Date End Date Taking? Authorizing Provider  acetaminophen (TYLENOL) 500 MG tablet Take 1,000 mg by mouth every 6 (six) hours as needed for moderate pain.   Yes [provider]  MELATONIN PO Take 1 tablet by mouth at bedtime as needed (sleep).   Yes [provider]  escitalopram (LEXAPRO) 20 MG tablet Take 1 tablet (20 mg total) by mouth daily. Patient not taking: Reported on 09/10/2018 06/11/18   [name], MD  oxybutynin (DITROPAN) 5 MG tablet Take 0.5-1 tablets (2.5-5 mg total) by mouth every 8 (eight) hours as needed for bladder spasms. Patient not taking: Reported on 09/10/2018 06/11/18   [name], MD   Social History   Socioeconomic History  . Marital status: Married    Spouse name: Not on file  . Number of children: 0  . Years of education: Not on file  . Highest education level: Bachelor's degree (e.g., BA, AB, BS)  Occupational History  . Not on file  Social Needs  . Financial resource strain: Not hard at all  . Food insecurity:    Worry: Never true  Inability: Never true  . Transportation needs:    Medical: No    Non-medical: No  Tobacco Use  . Smoking status: Never Smoker  . Smokeless tobacco: Never Used  . Tobacco comment: smoked 2 weeks in college  Substance and Sexual Activity  . Alcohol use: No  . Drug use: No  . Sexual activity: Yes  Lifestyle  . Physical activity:    Days per week: 0 days    Minutes per session: 0 min  . Stress: Very much  Relationships  . Social connections:    Talks on phone: Never    Gets together: Never    Attends religious service: Never    Active member of club or organization: No    Attends meetings of clubs or  organizations: Never    Relationship status: Married  . Intimate partner violence:    Fear of current or ex partner: No    Emotionally abused: No    Physically abused: No    Forced sexual activity: No  Other Topics Concern  . Not on file  Social History Narrative  . Not on file    Observations/Objective: No distress on phone.  Results for orders placed or performed in visit on 09/10/18  Urine Culture  Result Value Ref Range   Urine Culture, Routine Final report    Organism ID, Bacteria Comment   POCT urinalysis dipstick  Result Value Ref Range   Color, UA yellow yellow   Clarity, UA clear clear   Glucose, UA negative negative mg/dL   Bilirubin, UA negative negative   Ketones, POC UA negative negative mg/dL   Spec Grav, UA 1.020 1.010 - 1.025   Blood, UA negative negative   pH, UA 7.5 5.0 - 8.0   Protein Ur, POC negative negative mg/dL   Urobilinogen, UA 0.2 0.2 or 1.0 E.U./dL   Nitrite, UA Negative Negative   Leukocytes, UA Negative Negative  POCT Microscopic Urinalysis ([facility])  Result Value Ref Range   WBC,UR,HPF,POC None None WBC/hpf   RBC,UR,HPF,POC None None RBC/hpf   Bacteria None None, Too numerous to count   Mucus Absent Absent   Epithelial Cells, UR Per Microscopy None None, Too numerous to count cells/hpf    Assessment and Plan: Hematuria, unspecified type - Plan: Urine Culture, POCT urinalysis dipstick, POCT Microscopic Urinalysis ([facility]) Postmenopausal bleeding - Plan: Ambulatory referral to Gynecology  -Appears to be postmenopausal vaginal bleeding, no sign of hematuria on urinalysis.  -Refer to gynecology for further work-up of postmenopausal bleeding.  RTC/ER precautions if acute worsening.  Rash and nonspecific skin eruption  -Now improving, possible allergic cause/contact dermatitis.  Over-the-counter treatment options discussed with RTC precautions given.  Follow Up Instructions:  Patient Instructions  I am glad to hear the rash is improving.  It  does sound like a possible allergic cause.  Over-the-counter Claritin, Allegra, or Zyrtec can be used if needed, or cortisone cream a few times per day to itching areas but if that is not improving in the next 5 to 7 days or worsening sooner, please schedule video visit.  Depending on urine test today can decide if symptoms may be from infection.  However I will refer you to gynecology for suspected postmenopausal bleeding.  Please let me know if there are questions.  Return to the clinic or go to the nearest emergency room if any of your symptoms worsen or new symptoms occur.   Postmenopausal Bleeding  Postmenopausal bleeding is any bleeding that happens after menopause. Menopause is  when a woman's period stops. Any type of bleeding after menopause should be checked by your doctor. Treatment will depend on the cause. Follow these instructions at home:  Pay attention to any changes in your symptoms.  Avoid using tampons and douches as told by your doctor.  Change your pads regularly.  Get regular pelvic exams and Pap tests.  Take iron pills as told by your doctor.  Take over-the-counter and prescription medicines only as told by your doctor.  Keep all follow-up visits as told by your doctor. This is important. Contact a doctor if:  Your bleeding lasts for more than 1 week.  You have belly (abdominal) pain.  You have bleeding during or after sex (intercourse).  You have bleeding that happens more often than every 3 weeks. Get help right away if:  You have a fever, chills, a headache, dizziness, muscle aches, and bleeding.  You have strong pain with bleeding.  You have clumps of blood (blood clots) coming from your vagina.  You have a lot of bleeding and: ? You need more than 1 pad an hour. ? This has never happened before.  You feel like you are going to pass out (faint). Summary  Any type of bleeding after menopause should be checked by your doctor.  Pay attention  to any changes in your symptoms.  Keep all follow-up visits as told by your doctor. This information is not intended to replace advice given to you by your health care provider. Make sure you discuss any questions you have with your health care provider. Document Released: 02/28/2008 Document Revised: 06/26/2016 Document Reviewed: 06/26/2016 Elsevier Interactive Patient Education  2019 [employer]  Rash, Adult A rash is a change in the color of your skin. A rash can also change the way your skin feels. There are many different conditions and factors that can cause a rash. Some rashes may disappear after a few days, but some may last for a few weeks. Common causes of rashes include:  Viral infections, such as: ? Colds. ? Measles. ? Hand, foot, and mouth disease.  Bacterial infections, such as: ? Scarlet fever. ? Impetigo.  Fungal infections, such as Candida.  Allergic reactions to food, medicines, or skin care products. Follow these instructions at home: The goal of treatment is to stop the itching and keep the rash from spreading. Pay attention to any changes in your symptoms. Follow these instructions to help with your condition: Medicine Take or apply over-the-counter and prescription medicines only as told by your health care provider. These may include:  Corticosteroid creams to treat red or swollen skin.  Anti-itch lotions.  Oral allergy medicines (antihistamines).  Oral corticosteroids for severe symptoms.  Skin care  Apply cool compresses to the affected areas.  Do not scratch or rub your skin.  Avoid covering the rash. Make sure the rash is exposed to air as much as possible. Managing itching and discomfort  Avoid hot showers or baths, which can make itching worse. A cold shower may help.  Try taking a bath with: ? Epsom salts. Follow manufacturer instructions on the packaging. You can get these at your local pharmacy or grocery store. ? Baking soda. Pour a  small amount into the bath as told by your health care provider. ? Colloidal oatmeal. Follow manufacturer instructions on the packaging. You can get this at your local pharmacy or grocery store.  Try applying baking soda paste to your skin. Stir water into baking soda until it reaches a  paste-like consistency.  Try applying calamine lotion. This is an over-the-counter lotion that helps to relieve itchiness.  Keep cool and out of the sun. Sweating and being hot can make itching worse. General instructions   Rest as needed.  Drink enough fluid to keep your urine pale yellow.  Wear loose-fitting clothing.  Avoid scented soaps, detergents, and perfumes. Use gentle soaps, detergents, perfumes, and other cosmetic products.  Avoid any substance that causes your rash. Keep a journal to help track what causes your rash. Write down: ? What you eat. ? What cosmetic products you use. ? What you drink. ? What you wear. This includes jewelry.  Keep all follow-up visits as told by your health care provider. This is important. Contact a health care provider if:  You sweat at night.  You lose weight.  You urinate more than normal.  You urinate less than normal, or you notice that your urine is a darker color than usual.  You feel weak.  You vomit.  Your skin or the whites of your eyes look yellow (jaundice).  Your skin: ? Tingles. ? Is numb.  Your rash: ? Does not go away after several days. ? Gets worse.  You are: ? Unusually thirsty. ? More tired than normal.  You have: ? New symptoms. ? Pain in your abdomen. ? A fever. ? Diarrhea. Get help right away if you:  Have a fever and your symptoms suddenly get worse.  Develop confusion.  Have a severe headache or a stiff neck.  Have severe joint pains or stiffness.  Have a seizure.  Develop a rash that covers all or most of your body. The rash may or may not be painful.  Develop blisters that: ? Are on top of the  rash. ? Grow larger or grow together. ? Are painful. ? Are inside your nose or mouth.  Develop a rash that: ? Looks like purple pinprick-sized spots all over your body. ? Has a "bull's eye" or looks like a target. ? Is not related to sun exposure, is red and painful, and causes your skin to peel. Summary  A rash is a change in the color of your skin. Some rashes disappear after a few days, but some may last for a few weeks.  The goal of treatment is to stop the itching and keep the rash from spreading.  Take or apply over-the-counter and prescription medicines only as told by your health care provider.  Contact a health care provider if you have new or worsening symptoms.  Keep all follow-up visits as told by your health care provider. This is important. This information is not intended to replace advice given to you by your health care provider. Make sure you discuss any questions you have with your health care provider. Document Released: 05/11/2002 Document Revised: 12/23/2017 Document Reviewed: 12/23/2017 Elsevier Interactive Patient Education  2019 [employer].      I discussed the assessment and treatment plan with the patient. The patient was provided an opportunity to ask questions and all were answered. The patient agreed with the plan and demonstrated an understanding of the instructions.   The patient was advised to call back or seek an in-person evaluation if the symptoms worsen or if the condition fails to improve as anticipated.  I provided 13 minutes of non-face-to-face time during this encounter.  Signed,   [name], MD [facility].  09/10/18

## 2018-09-11 LAB — URINE CULTURE

## 2018-09-12 ENCOUNTER — Telehealth: Payer: Self-pay | Admitting: Emergency Medicine

## 2018-09-12 ENCOUNTER — Telehealth: Payer: Self-pay | Admitting: Family Medicine

## 2018-09-12 NOTE — Telephone Encounter (Signed)
Left a msg for patient to return call with clarification on rectal or vaginal bleeding.

## 2018-09-12 NOTE — Telephone Encounter (Signed)
Copied from [place] [id number]. Topic: Referral - Request for Referral >> Sep 12, 2018 12:45 PM Reyne Dumas L wrote: Has patient seen PCP for this complaint? yes *If NO, is insurance requiring patient see PCP for this issue before PCP can refer them? Referral for which specialty: gynecologist Preferred provider/office: no preference Reason for referral: bleeding - urologist states all is okay on their end

## 2018-09-13 NOTE — Telephone Encounter (Signed)
See prior note with telemed visit. Referral placed to gynecology.

## 2018-09-17 ENCOUNTER — Other Ambulatory Visit: Payer: Self-pay

## 2018-09-19 ENCOUNTER — Other Ambulatory Visit: Payer: Self-pay

## 2018-09-19 ENCOUNTER — Ambulatory Visit: Payer: PPO | Admitting: Obstetrics & Gynecology

## 2018-09-19 ENCOUNTER — Encounter: Payer: Self-pay | Admitting: Obstetrics & Gynecology

## 2018-09-19 VITALS — BP 140/88 | Ht 64.0 in | Wt 190.0 lb

## 2018-09-19 DIAGNOSIS — R87619 Unspecified abnormal cytological findings in specimens from cervix uteri: Secondary | ICD-10-CM | POA: Diagnosis not present

## 2018-09-19 DIAGNOSIS — N950 Postmenopausal bleeding: Secondary | ICD-10-CM

## 2018-09-19 DIAGNOSIS — Z124 Encounter for screening for malignant neoplasm of cervix: Secondary | ICD-10-CM | POA: Diagnosis not present

## 2018-09-19 DIAGNOSIS — N841 Polyp of cervix uteri: Secondary | ICD-10-CM

## 2018-09-19 NOTE — Progress Notes (Signed)
Zoe Kim 06-Nov-1945 [id number]   History:    73 y.o. G1P0A1 Married.  Retired Public house manager.  RP:  Postmenopausal bleeding x several months  HPI: Light PMB on-off x several months.  Mild cramping in lower abdominal when spotting, resembling the menstrual cramps she used to have before menopause.  No HRT.  No abnormal vaginal discharge.  U/A no hematuria 09/10/2018.  BMs normal, no blood around stools. Abstinent.  Not on any anticoagulant.   Past medical history,surgical history, family history and social history were all reviewed and documented in the EPIC chart.  Gynecologic History No LMP recorded. Patient is postmenopausal. Contraception: post menopausal status Last Pap: 08/2011. Results were: LGSIL/HPV HR negative Last mammogram: 10/2016. Results were: abnormal.  Rt breast Bx Fibrocystic changes Bone Density: 07/2017 Normal Colono Referred to Gastro through Fam MD  Obstetric History OB History  Gravida Para Term Preterm AB Living  1 0     1 0  SAB TAB Ectopic Multiple Live Births               # Outcome Date GA Lbr Len/2nd Weight Sex Delivery Anes PTL Lv  1 AB              ROS: A ROS was performed and pertinent positives and negatives are included in the history.  GENERAL: No fevers or chills. HEENT: No change in vision, no earache, sore throat or sinus congestion. NECK: No pain or stiffness. CARDIOVASCULAR: No chest pain or pressure. No palpitations. PULMONARY: No shortness of breath, cough or wheeze. GASTROINTESTINAL: No abdominal pain, nausea, vomiting or diarrhea, melena or bright red blood per rectum. GENITOURINARY: No urinary frequency, urgency, hesitancy or dysuria. MUSCULOSKELETAL: No joint or muscle pain, no back pain, no recent trauma. DERMATOLOGIC: No rash, no itching, no lesions. ENDOCRINE: No polyuria, polydipsia, no heat or cold intolerance. No recent change in weight. HEMATOLOGICAL: No anemia or easy bruising or bleeding. NEUROLOGIC:  No headache, seizures, numbness, tingling or weakness. PSYCHIATRIC: No depression, no loss of interest in normal activity or change in sleep pattern.     Exam:   BP 140/88   Ht 5\' 4"  (1.626 m)   Wt 190 lb (86.2 kg)   BMI 32.61 kg/m   Body mass index is 32.61 kg/m.  General appearance : Well developed well nourished female. No acute distress  Abdomen: no palpable masses or tenderness, no rebound or guarding  Pelvic: Vulva: Normal             Vagina: No gross lesions or discharge  Cervix: Large long Polyp coming out at the External Os.  No active bleeding.  Pap reflex done.  Endometrial Biopsy and removal of Endocervical Polyp:  Verbal consent obtain.  Spray of Hurricane on Cervix.  Betadine prep.  Tenaculum applied to anterior lip of cervix.  Endometrial canula inserted easily in intra-uterine cavity.  Specimen obtain with suction on all endometrial surfaces.  Large specimen sent to pathology.  EBx canula removed.  Tenaculum removed.  Endocervical Polyp grasped with a fenestrated clamp and removed by rotation/detachment at the base.  Specimen of Polyp sent to pathology separately.  Hemostasis with Silver Nitrate.  Speculum removed.  No Cx.  Well tolerated by patient.  Bimanual exam: Uterus  AV, normal size, shape and consistency, non-tender and mobile                 Adnexa  Without masses or tenderness. Anus: Normal   Assessment/Plan:  73  y.o. female for annual exam   1. Postmenopausal bleeding Postmenopausal bleeding associated with a large endocervical polyp.  Endocervical polyp removed easily and sent to pathology.  Will rule out endometrial hyperplasia or endometrial cancer with an endometrial biopsy also done today.  Specimen sent to pathology.  Well-tolerated by patient and no complication.  We will follow-up with a pelvic ultrasound to evaluate the endometrial cavity.  Will discuss pathology reports at that visit.  Management per results. - US Transvaginal Non-OB; Future   2. Endocervical polyp Excision of large endocervical polyp.  Sent to pathology. - Pathology Report  3. Screening for malignant neoplasm of cervix Pap reflex done today.  Last Pap test in March 2013 showed LGSIL with negative high-risk HPV. - Pap IG w/ reflex to HPV when [facility]  Counseling on above issues and coordination of care more than 50% for 45 minutes.  [name], 12:06 PM 09/19/2018

## 2018-09-19 NOTE — Patient Instructions (Addendum)
1. Postmenopausal bleeding Postmenopausal bleeding associated with a large endocervical polyp.  Endocervical polyp removed easily and sent to pathology.  Will rule out endometrial hyperplasia or endometrial cancer with an endometrial biopsy also done today.  Specimen sent to pathology.  Well-tolerated by patient and no complication.  We will follow-up with a pelvic ultrasound to evaluate the endometrial cavity.  Will discuss pathology reports at that visit.  Management per results. - US Transvaginal Non-OB; Future  2. Endocervical polyp Excision of large endocervical polyp.  Sent to pathology. - Pathology Report  3. Screening for malignant neoplasm of cervix Pap reflex done today.  Last Pap test in March 2013 showed LGSIL with negative high-risk HPV. - Pap IG w/ reflex to HPV when ASC-U  Zoe Kim, it was a pleasure meeting you today!  I will inform you of your results as soon as they are available.

## 2018-09-23 LAB — PAP IG W/ RFLX HPV ASCU

## 2018-09-23 NOTE — Telephone Encounter (Signed)
Close chart

## 2018-09-25 LAB — TISSUE PATH REPORT

## 2018-09-25 LAB — PATHOLOGY REPORT

## 2018-09-26 ENCOUNTER — Telehealth: Payer: Self-pay | Admitting: *Deleted

## 2018-09-26 NOTE — Telephone Encounter (Signed)
Patho: Endometrial biopsy: Endometrial Serous Carcinoma and Endometrioid  Adenocarcinoma, FIGO Grade 2. Please organize urgent referral with Gyneco-Oncology. Can organize a televisit or keep the visit in person at patient's preference. Cancel Pelvic US.    Message sent to [facility] to schedule.

## 2018-09-26 NOTE — Telephone Encounter (Signed)
Patient scheduled on 10/01/18 @ 11:00am with Dr. Denman George.   Dr.Lavoie called patient and left a message that we will be scheduling appointment with gyn-oncology.

## 2018-09-27 IMAGING — DX DG CHEST 2V
2 series · 2 of 2 positions shown · non-contrast
Comparison: None.

CLINICAL DATA: Preoperative.

EXAM:
CHEST  2 VIEW

[chest pa]
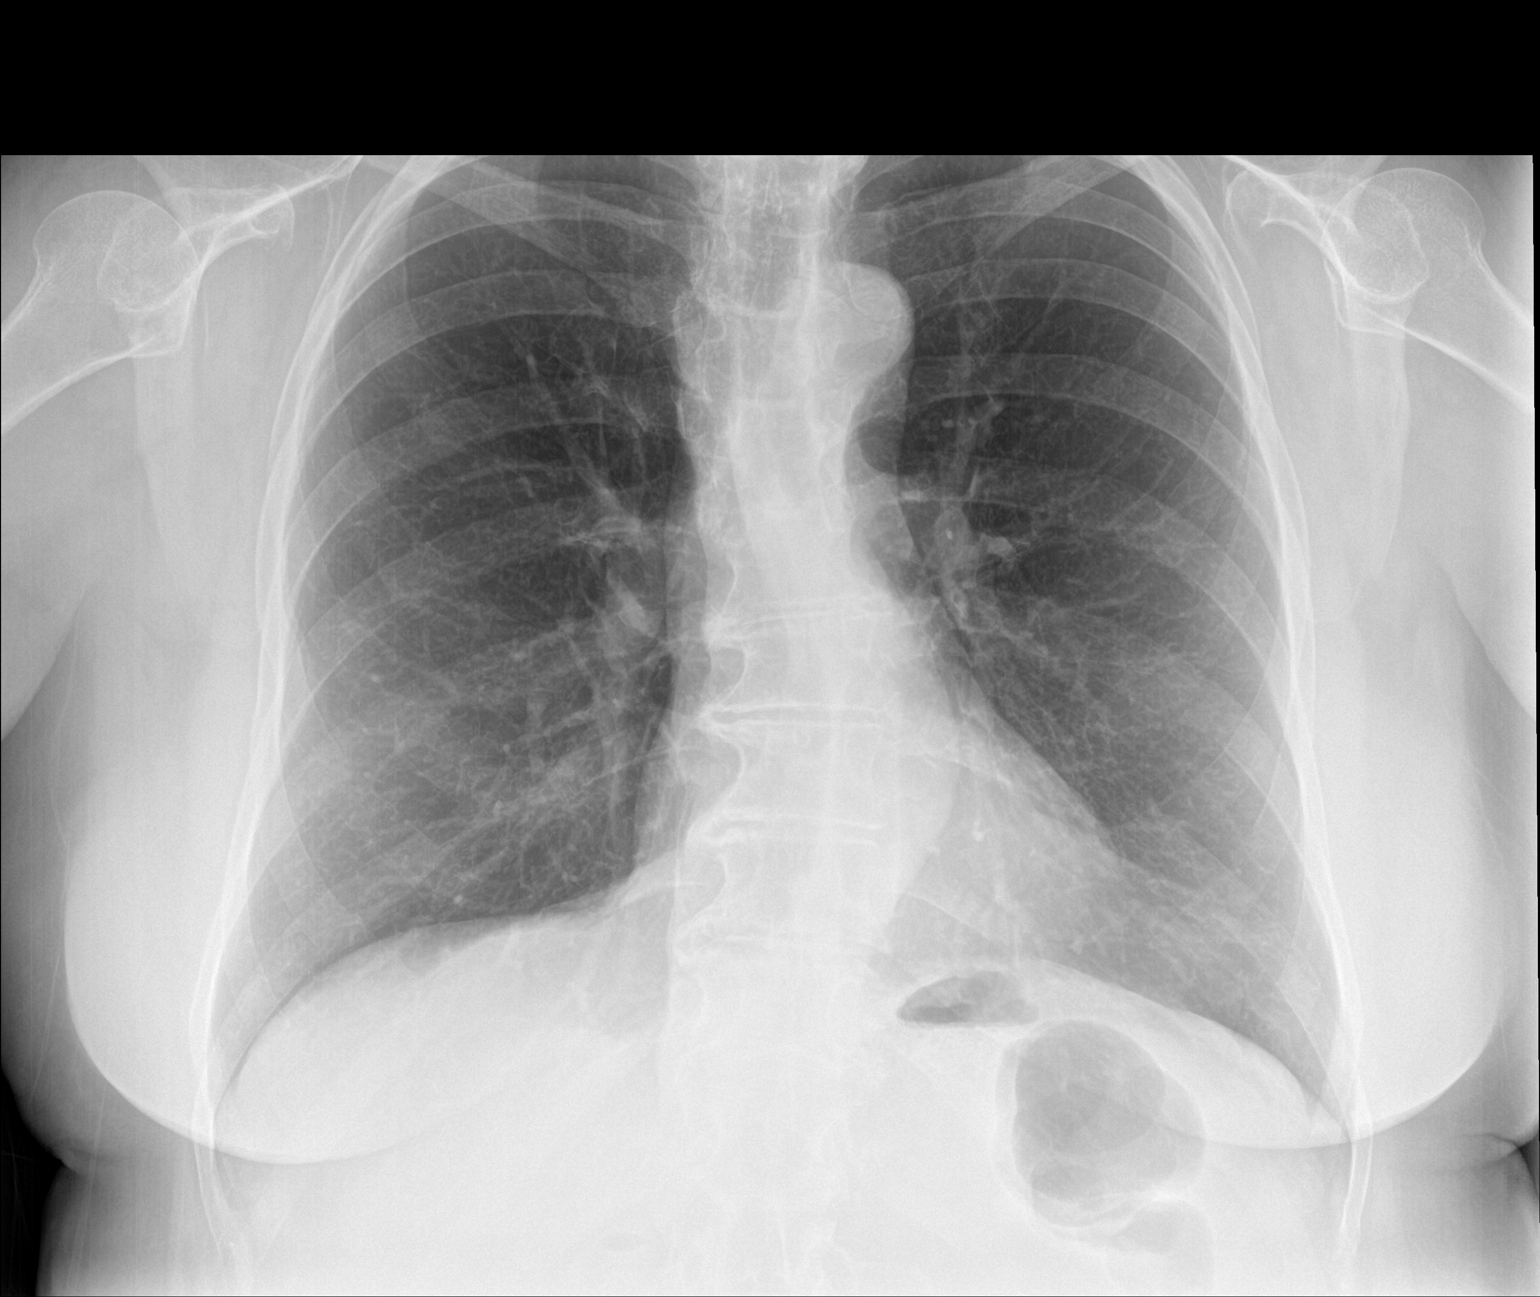

[chest lat]
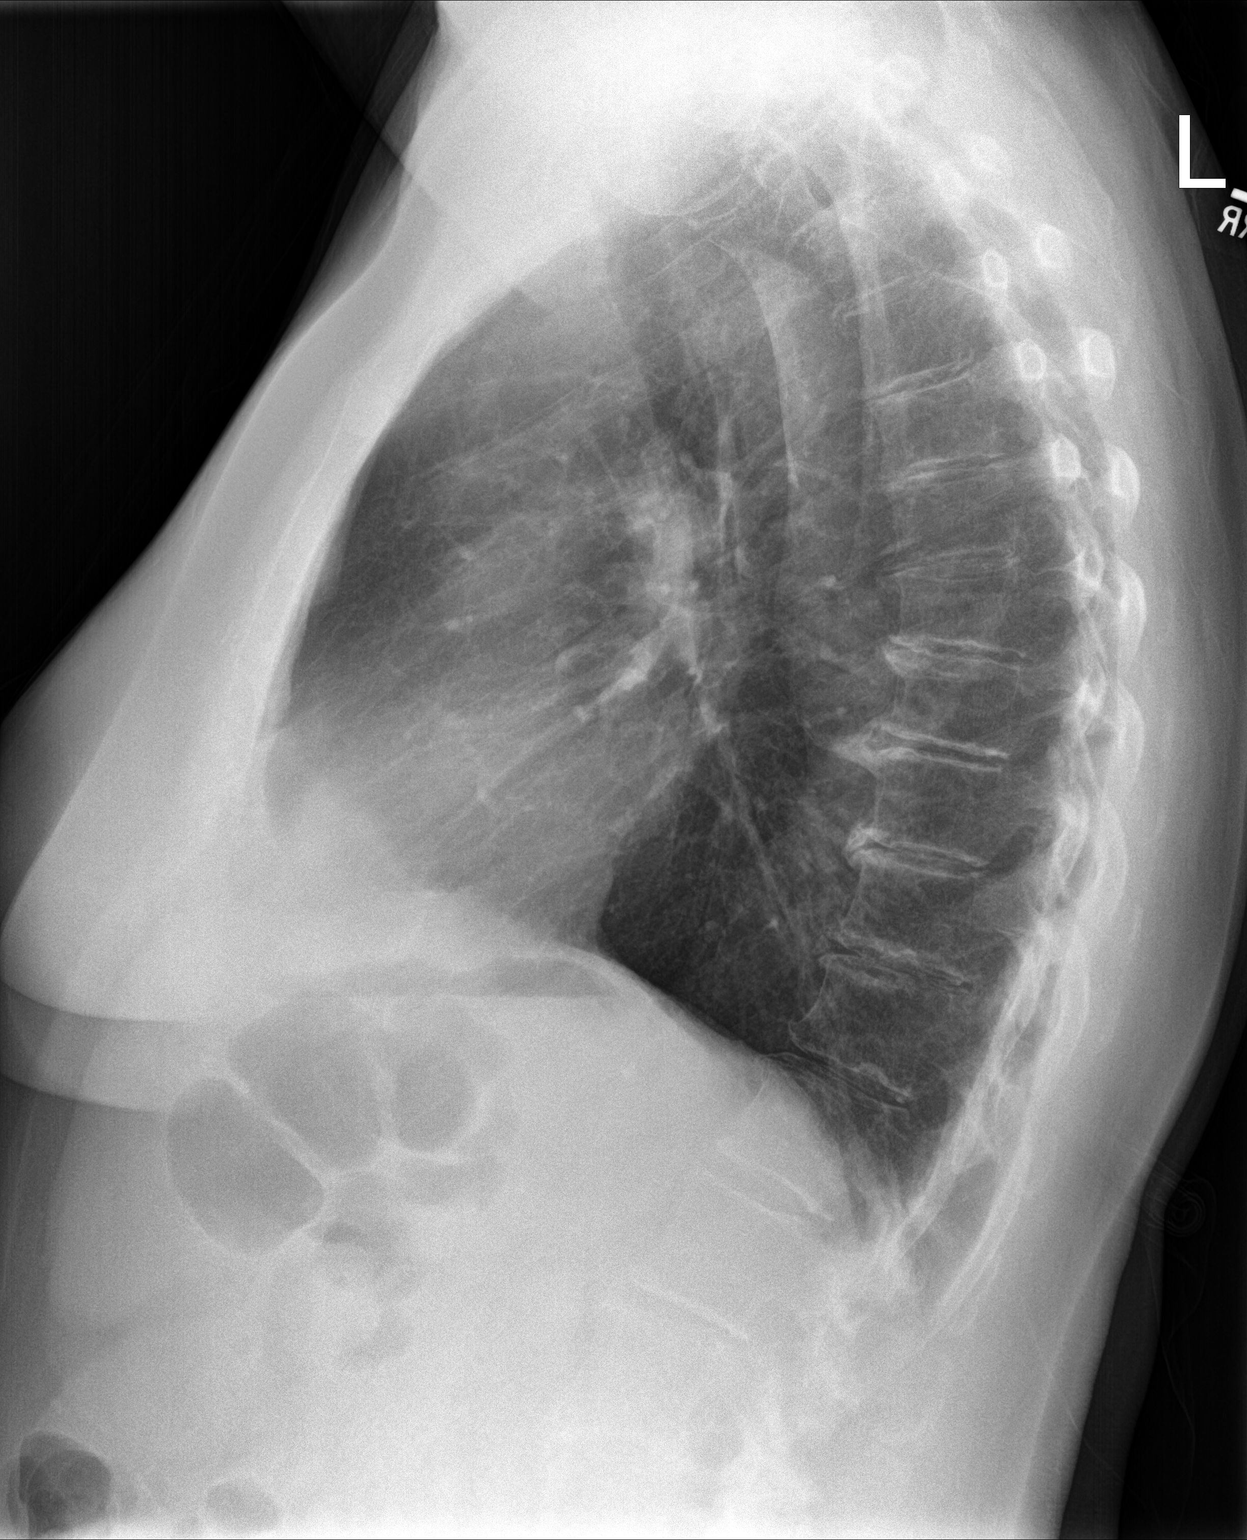

[2 of 2 positions shown; findings below may reference images not displayed]

FINDINGS: Cardiomediastinal silhouette is normal. Mediastinal contours appear
intact. Ectatic aorta with calcific atherosclerotic disease noted.

There is no evidence of focal airspace consolidation, pleural
effusion or pneumothorax. Osseous structures are without acute
abnormality. Soft tissues are grossly normal.
IMPRESSION: No active cardiopulmonary disease.

Ectasia and calcific atherosclerotic disease of the aorta.

## 2018-09-29 NOTE — Telephone Encounter (Signed)
I canceled ultrasound appointment, I called patient back again today to discuss the below and to confirm she received Dr.Lavoie message. patient said she did but had a lot going on with her dog being sick. She found out about gyn-oncology appointment via my chart and spoke with cancer center on 09/26/18. I offered virtual visit with Dr.Lavoie to discuss results in detail. Patient is scheduled on 10/01/18 @ 9am

## 2018-10-01 ENCOUNTER — Encounter: Payer: Self-pay | Admitting: Gynecologic Oncology

## 2018-10-01 ENCOUNTER — Ambulatory Visit (INDEPENDENT_AMBULATORY_CARE_PROVIDER_SITE_OTHER): Payer: PPO | Admitting: Obstetrics & Gynecology

## 2018-10-01 ENCOUNTER — Inpatient Hospital Stay (HOSPITAL_BASED_OUTPATIENT_CLINIC_OR_DEPARTMENT_OTHER): Payer: PPO | Admitting: Gynecologic Oncology

## 2018-10-01 ENCOUNTER — Other Ambulatory Visit: Payer: Self-pay

## 2018-10-01 ENCOUNTER — Encounter: Payer: Self-pay | Admitting: Obstetrics & Gynecology

## 2018-10-01 ENCOUNTER — Other Ambulatory Visit: Payer: Self-pay | Admitting: Gynecologic Oncology

## 2018-10-01 ENCOUNTER — Inpatient Hospital Stay: Payer: PPO | Attending: Gynecologic Oncology

## 2018-10-01 ENCOUNTER — Encounter: Payer: Self-pay | Admitting: Oncology

## 2018-10-01 VITALS — BP 138/80 | HR 78 | Temp 98.7°F | Resp 18 | Ht 64.0 in | Wt 190.0 lb

## 2018-10-01 DIAGNOSIS — R3915 Urgency of urination: Secondary | ICD-10-CM | POA: Diagnosis not present

## 2018-10-01 DIAGNOSIS — C541 Malignant neoplasm of endometrium: Secondary | ICD-10-CM | POA: Diagnosis not present

## 2018-10-01 DIAGNOSIS — E669 Obesity, unspecified: Secondary | ICD-10-CM | POA: Insufficient documentation

## 2018-10-01 DIAGNOSIS — Z6832 Body mass index (BMI) 32.0-32.9, adult: Secondary | ICD-10-CM | POA: Insufficient documentation

## 2018-10-01 DIAGNOSIS — D4959 Neoplasm of unspecified behavior of other genitourinary organ: Secondary | ICD-10-CM

## 2018-10-01 DIAGNOSIS — N3941 Urge incontinence: Secondary | ICD-10-CM

## 2018-10-01 LAB — COMPREHENSIVE METABOLIC PANEL
ALT: 18 U/L (ref 0–44)
AST: 22 U/L (ref 15–41)
Albumin: 4.2 g/dL (ref 3.5–5.0)
Alkaline Phosphatase: 148 U/L — ABNORMAL HIGH (ref 38–126)
Anion gap: 7 (ref 5–15)
BUN: 12 mg/dL (ref 8–23)
CO2: 27 mmol/L (ref 22–32)
Calcium: 9.2 mg/dL (ref 8.9–10.3)
Chloride: 106 mmol/L (ref 98–111)
Creatinine, Ser: 0.84 mg/dL (ref 0.44–1.00)
GFR calc Af Amer: >60 mL/min (ref >60)
GFR calc non Af Amer: >60 mL/min (ref >60)
Glucose, Bld: 93 mg/dL (ref 70–99)
Potassium: 4.4 mmol/L (ref 3.5–5.1)
Sodium: 140 mmol/L (ref 135–145)
Total Bilirubin: 0.5 mg/dL (ref 0.3–1.2)
Total Protein: 7.4 g/dL (ref 6.5–8.1)

## 2018-10-01 MED ORDER — OXYCODONE HCL 5 MG PO TABS: 5.0000 mg | ORAL_TABLET | ORAL | 0 refills | Status: DC | PRN

## 2018-10-01 MED ORDER — SENNOSIDES-DOCUSATE SODIUM 8.6-50 MG PO TABS: 2.0000 | ORAL_TABLET | Freq: Every day | ORAL | 1 refills | Status: DC

## 2018-10-01 MED ORDER — IBUPROFEN 600 MG PO TABS: 600.0000 mg | ORAL_TABLET | Freq: Four times a day (QID) | ORAL | 1 refills | Status: DC | PRN

## 2018-10-01 NOTE — Patient Instructions (Signed)
Plan to have a CT scan of the abdomen and pelvis to evaluate for potential spread of the cancer. We will contact you with the results. We will also be checking a metabolic panel today to evaluate your kidney function prior to administration of the contrast for the CT scan.  Preparing for your Surgery  Plan for surgery on Oct 07, 2018 with Dr. Everitt Amber at [place] will be scheduled for a robotic assisted total laparoscopic hysterectomy, bilateral salpingo-oophorectomy, sentinel lymph node biopsy, possible mini-laparotomy.   Pre-operative Testing -You will receive a phone call from presurgical testing at [facility] if you have not received a call already to arrange for a pre-operative testing appointment before your surgery.  This appointment normally occurs one to two weeks before your scheduled surgery.   -Bring your insurance card, copy of an advanced directive if applicable, medication list  -At that visit, you will be asked to sign a consent for a possible blood transfusion in case a transfusion becomes necessary during surgery.  The need for a blood transfusion is rare but having consent is a necessary part of your care.     -You should not be taking blood thinners or aspirin at least ten days prior to surgery unless instructed by your surgeon.  -Do not take supplements such as fish oil (omega 3), red yeast rice, tumeric before your surgery.  Day Before Surgery at [facility] will be asked to take in a light diet the day before surgery.  Avoid carbonated beverages.  You will be advised to have nothing to eat or drink after midnight the evening before.    Eat a light diet the day before surgery.  Examples including soups, broths, toast, yogurt, mashed potatoes.  Things to avoid include carbonated beverages (fizzy beverages), raw fruits and raw vegetables, or beans.   If your bowels are filled with gas, your surgeon will have difficulty visualizing your pelvic organs  which increases your surgical risks.  Your role in recovery Your role is to become active as soon as directed by your doctor, while still giving yourself time to heal.  Rest when you feel tired. You will be asked to do the following in order to speed your recovery:  - Cough and breathe deeply. This helps toclear and expand your lungs and can prevent pneumonia. You may be given a spirometer to practice deep breathing. A staff member will show you how to use the spirometer. - Do mild physical activity. Walking or moving your legs help your circulation and body functions return to normal. A staff member will help you when you try to walk and will provide you with simple exercises. Do not try to get up or walk alone the first time. - Actively manage your pain. Managing your pain lets you move in comfort. We will ask you to rate your pain on a scale of zero to 10. It is your responsibility to tell your doctor or nurse where and how much you hurt so your pain can be treated.  Special Considerations -If you are diabetic, you may be placed on insulin after surgery to have closer control over your blood sugars to promote healing and recovery.  This does not mean that you will be discharged on insulin.  If applicable, your oral antidiabetics will be resumed when you are tolerating a solid diet.  -Your final pathology results from surgery should be available around one week after surgery and the results will be relayed  to you when available.  -Dr. Lahoma Crocker is the surgeon that assists your GYN Oncologist with surgery.  If you end up staying the night, the next day after your surgery you will either see Dr. Denman George or Dr. Lahoma Crocker.  -FMLA forms can be faxed to [phone] and please allow 5-7 business days for completion.  Pain Management After Surgery -You have been prescribed your pain medication and bowel regimen medications before surgery so that you can have these available when you are  discharged from the hospital. The pain medication is for use ONLY AFTER surgery and a new prescription will not be given.   -Make sure that you have Tylenol and Ibuprofen at home to use on a regular basis after surgery for pain control. We recommend alternating the medications every hour to six hours since they work differently and are processed in the body differently for pain relief.  -Review the attached handout on narcotic use and their risks and side effects.   Bowel Regimen -You have been prescribed Sennakot-S to take nightly to prevent constipation especially if you are taking the narcotic pain medication intermittently.  It is important to prevent constipation and drink adequate amounts of liquids.  Blood Transfusion Information WHAT IS A BLOOD TRANSFUSION? A transfusion is the replacement of blood or some of its parts. Blood is made up of multiple cells which provide different functions.  Red blood cells carry oxygen and are used for blood loss replacement.  White blood cells fight against infection.  Platelets control bleeding.  Plasma helps clot blood.  Other blood products are available for specialized needs, such as hemophilia or other clotting disorders. BEFORE THE TRANSFUSION  Who gives blood for transfusions?   You may be able to donate blood to be used at a later date on yourself (autologous donation).  Relatives can be asked to donate blood. This is generally not any safer than if you have received blood from a stranger. The same precautions are taken to ensure safety when a relative's blood is donated.  Healthy volunteers who are fully evaluated to make sure their blood is safe. This is blood bank blood. Transfusion therapy is the safest it has ever been in the practice of medicine. Before blood is taken from a donor, a complete history is taken to make sure that person has no history of diseases nor engages in risky social behavior (examples are intravenous drug use  or sexual activity with multiple partners). The donor's travel history is screened to minimize risk of transmitting infections, such as malaria. The donated blood is tested for signs of infectious diseases, such as HIV and hepatitis. The blood is then tested to be sure it is compatible with you in order to minimize the chance of a transfusion reaction. If you or a relative donates blood, this is often done in anticipation of surgery and is not appropriate for emergency situations. It takes many days to process the donated blood. RISKS AND COMPLICATIONS Although transfusion therapy is very safe and saves many lives, the main dangers of transfusion include:   Getting an infectious disease.  Developing a transfusion reaction. This is an allergic reaction to something in the blood you were given. Every precaution is taken to prevent this. The decision to have a blood transfusion has been considered carefully by your caregiver before blood is given. Blood is not given unless the benefits outweigh the risks.

## 2018-10-01 NOTE — Progress Notes (Signed)
    Zoe Kim 04-Mar-1946 [id number]        73 y.o.  G1P0010  Married.    Televisit consented by patient.  Televisit by Webex between patient at her home and myself in the office at [facility].  Webex started with good video image, but patient was not successful putting the sound on.  The visit was therefore changed to a telephone visit.  The review of chart, the Webex and telephone visit lasted 25 minutes in total.  RP: Discuss pathology results of Endometrial Bx: Serous/Endometrioid Adenocarcinoma of the Endometrium   HPI: New patient, who presented for PMB for several months on 09/19/2018.  Mild cramping when spotting.  Postmenopausal on no HRT.  Not seen by a Gynecologist x 7 yrs before that per patient.  At that visit, the gynecologic exam revealed a large polyp protruding at the EO into the vagina.  A Pap test was done.  The Polyp was excised and an Endometrial Bx was performed.  Minimal bleeding and no pelvic pain since then. No fever.    OB History  Gravida Para Term Preterm AB Living  1 0     1 0  SAB TAB Ectopic Multiple Live Births               # Outcome Date GA Lbr Len/2nd Weight Sex Delivery Anes PTL Lv  1 AB             Past medical history,surgical history, problem list, medications, allergies, family history and social history were all reviewed and documented in the EPIC chart.   Directed ROS with pertinent positives and negatives documented in the history of present illness/assessment and plan.  Exam:  There were no vitals filed for this visit.   Pathology 09/19/2018: Endometrial Serous Carcinoma and Endometrioid  Adenocarcinoma, FIGO Grade 2.  Benign Endocervical Polyp.  Pap AGUS   Assessment/Plan:  73 y.o. G1P0010   1. Endometrial adenocarcinoma (Zoe Kim) Endometrial biopsy showing a serous carcinoma and endometrioid adenocarcinoma FIGO grade 2.  Pathology discussed with patient.  Patient referred to gyneco-oncology, has an appointment today with Zoe Kim.   Surgical management and probable adjuvant therapy reviewed.  Patient informed that Zoe Kim will schedule and perform her surgery and discuss treatment further.  Counseling done and all questions answered.  [name], 9:10 AM 10/01/2018

## 2018-10-01 NOTE — H&P (View-Only) (Signed)
Consult Note: Gyn-Onc  Consult was requested by Dr. [name] for the evaluation of Zoe Kim 73 y.o. female  CC:  Chief Complaint  Patient presents with  . Endometrial Cancer    New patient    Assessment/Plan:  Zoe. Zoe Kim  is a 73 y.o.  year old with serous endometrial cancer.  A detailed discussion was held with the patient and her family with regard to to her endometrial cancer diagnosis. We discussed the standard management options for uterine cancer which includes surgery followed possibly by adjuvant therapy depending on the results of surgery. The options for surgical management include a hysterectomy and removal of the tubes and ovaries possibly with removal of pelvic and para-aortic lymph nodes.If feasible, a minimally invasive approach including a robotic hysterectomy or laparoscopic hysterectomy have benefits including shorter hospital stay, recovery time and better wound healing than with open surgery. The patient has been counseled about these surgical options and the risks of surgery in general including infection, bleeding, damage to surrounding structures (including bowel, bladder, ureters, nerves or vessels), and the postoperative risks of PE/ DVT, and lymphedema. I extensively reviewed the additional risks of robotic hysterectomy including possible need for conversion to open laparotomy.  I discussed positioning during surgery of trendelenberg and risks of minor facial swelling and care we take in preoperative positioning.  After counseling and consideration of her options, she desires to proceed with robotic assisted total hysterectomy with bilateral sapingo-oophorectomy and SLN biopsy. She may need a minilaparotomy for specimen delivery given her narrow introitus.   She will be seen by anesthesia for preoperative clearance and discussion of postoperative pain management.  She was given the opportunity to ask questions, which were answered to her  satisfaction, and she is agreement with the above mentioned plan of care.  We will obtain a preoperative CT abd/pelvis due to her high risk features (serous endometrial cancer).   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  The patient reports 6 months or maybe longer of intermittent vaginal spotting.  She was seen by Dr. [name] on September 19, 2018 who performed a Pap smear which revealed atypical glandular cells and an endocervical biopsy which revealed a benign endocervical polyp, and endometrial biopsy which revealed high-grade serous endometrial adenocarcinoma and adenocarcinoma FIGO grade 2.  The patient has a personal history for being nulliparous with the exception of 1 trimester miscarriage.  She denies hypertension hyperlipidemia diabetes mellitus.  She is overweight with a BMI of 32 kg/m.  Had a knee replacement but no prior abdominal surgeries.  She has no history of anticoagulant use.  Current Meds:  Outpatient Encounter Medications as of 10/01/2018  Medication Sig  . acetaminophen (TYLENOL) 500 MG tablet Take 1,000 mg by mouth every 6 (six) hours as needed for moderate pain.  Marland Kitchen MELATONIN PO Take 1 tablet by mouth at bedtime as needed (sleep).  Marland Kitchen oxybutynin (DITROPAN) 5 MG tablet Take 0.5-1 tablets (2.5-5 mg total) by mouth every 8 (eight) hours as needed for bladder spasms. (Patient taking differently: Take 5 mg by mouth at bedtime. )  . [DISCONTINUED] escitalopram (LEXAPRO) 20 MG tablet Take 1 tablet (20 mg total) by mouth daily. (Patient not taking: Reported on 09/10/2018)   No facility-administered encounter medications on file as of 10/01/2018.     Allergy:  Allergies  Allergen Reactions  . Sulfa Antibiotics  UNSPECIFIED REACTION     Social Hx:   Social History   Socioeconomic History  . Marital status: Married    Spouse name: Not on file  . Number of  children: 0  . Years of education: Not on file  . Highest education level: Bachelor's degree (e.g., BA, AB, BS)  Occupational History  . Not on file  Social Needs  . Financial resource strain: Not hard at all  . Food insecurity:    Worry: Never true    Inability: Never true  . Transportation needs:    Medical: No    Non-medical: No  Tobacco Use  . Smoking status: Never Smoker  . Smokeless tobacco: Never Used  . Tobacco comment: smoked 2 weeks in college  Substance and Sexual Activity  . Alcohol use: No  . Drug use: No  . Sexual activity: Not Currently  Lifestyle  . Physical activity:    Days per week: 0 days    Minutes per session: 0 min  . Stress: Very much  Relationships  . Social connections:    Talks on phone: Never    Gets together: Never    Attends religious service: Never    Active member of club or organization: No    Attends meetings of clubs or organizations: Never    Relationship status: Married  . Intimate partner violence:    Fear of current or ex partner: No    Emotionally abused: No    Physically abused: No    Forced sexual activity: No  Other Topics Concern  . Not on file  Social History Narrative  . Not on file    Past Surgical Hx:  Past Surgical History:  Procedure Laterality Date  . COLONOSCOPY    . EYE SURGERY Bilateral    cataract with lens  . TONSILLECTOMY    . TOOTH EXTRACTION Right 08/27/2017  . TOTAL KNEE ARTHROPLASTY Right 07/30/2016   Procedure: TOTAL KNEE ARTHROPLASTY;  Surgeon: Elsie Saas, MD;  Location: [facility];  Service: Orthopedics;  Laterality: Right;  . VEIN LIGATION AND STRIPPING      Past Medical Hx:  Past Medical History:  Diagnosis Date  . Anxiety   . Arthritis   . Depression   . Endometrial ca ([place])   . Heart murmur    "years ago"  . Insomnia   . Pneumonia    "long time ago"  . Primary localized osteoarthrosis of the knee, right   . Urinary urgency     Past Gynecological History:  See HPI No LMP recorded.  Patient is postmenopausal.  Family Hx:  Family History  Problem Relation Age of Onset  . Heart disease Mother   . COPD Mother   . Cancer Mother        uterine  . Breast cancer Mother   . Heart disease Father   . COPD Brother   . Heart disease Brother   . Cancer Brother   . Cancer Maternal Grandmother        breast  . Stroke Maternal Grandfather     Review of Systems:  Constitutional  Feels well,    ENT Normal appearing ears and nares bilaterally Skin/Breast  No rash, sores, jaundice, itching, dryness Cardiovascular  No chest pain, shortness of breath, or edema  Pulmonary  No cough or wheeze.  Gastro Intestinal  No nausea, vomitting, or diarrhoea. No bright red blood per rectum, no abdominal pain, change in bowel movement, or constipation.  Genito Urinary  No frequency,  urgency, dysuria, + postmenopausal bleeding Musculo Skeletal  No myalgia, arthralgia, joint swelling or pain  Neurologic  No weakness, numbness, change in gait,  Psychology  No depression, anxiety, insomnia.   Vitals:  Blood pressure 138/80, pulse 78, temperature 98.7 F (37.1 C), temperature source Oral, resp. rate 18, height 5\' 4"  (1.626 m), weight 190 lb (86.2 kg), SpO2 100 %.  Physical Exam: WD in NAD Neck  Supple NROM, without any enlargements.  Lymph Node Survey No cervical supraclavicular or inguinal adenopathy Cardiovascular  Pulse normal rate, regularity and rhythm. S1 and S2 normal.  Lungs  Clear to auscultation bilateraly, without wheezes/crackles/rhonchi. Good air movement.  Skin  No rash/lesions/breakdown  Psychiatry  Alert and oriented to person, place, and time  Abdomen  Normoactive bowel sounds, abdomen soft, non-tender and overweight without evidence of hernia.  Back No CVA tenderness Genito Urinary  Vulva/vagina: Normal external female genitalia.   No lesions. No discharge or bleeding.  Bladder/urethra:  No lesions or masses, well supported bladder  Vagina: narrow  introitus and upper vagina.   Cervix: Normal appearing, no lesions.  Uterus:  Small, mobile, no parametrial involvement or nodularity.  Adnexa: no discrete masses. Rectal  deferred Extremities  No bilateral cyanosis, clubbing or edema.   Thereasa Solo, MD  10/01/2018, 12:24 PM

## 2018-10-01 NOTE — Progress Notes (Signed)
Consult Note: Gyn-Onc  Consult was requested by Dr. Dellis Kim for the evaluation of Zoe Kim 73 y.o. female  CC:  Chief Complaint  Patient presents with  . Endometrial Cancer    New patient    Assessment/Plan:  Zoe Kim  is a 73 y.o.  year old with serous endometrial cancer.  A detailed discussion was held with the patient and her family with regard to to her endometrial cancer diagnosis. We discussed the standard management options for uterine cancer which includes surgery followed possibly by adjuvant therapy depending on the results of surgery. The options for surgical management include a hysterectomy and removal of the tubes and ovaries possibly with removal of pelvic and para-aortic lymph nodes.If feasible, a minimally invasive approach including a robotic hysterectomy or laparoscopic hysterectomy have benefits including shorter hospital stay, recovery time and better wound healing than with open surgery. The patient has been counseled about these surgical options and the risks of surgery in general including infection, bleeding, damage to surrounding structures (including bowel, bladder, ureters, nerves or vessels), and the postoperative risks of PE/ DVT, and lymphedema. I extensively reviewed the additional risks of robotic hysterectomy including possible need for conversion to open laparotomy.  I discussed positioning during surgery of trendelenberg and risks of minor facial swelling and care we take in preoperative positioning.  After counseling and consideration of her options, she desires to proceed with robotic assisted total hysterectomy with bilateral sapingo-oophorectomy and SLN biopsy. She may need a minilaparotomy for specimen delivery given her narrow introitus.   She will be seen by anesthesia for preoperative clearance and discussion of postoperative pain management.  She was given the opportunity to ask questions, which were answered to her  satisfaction, and she is agreement with the above mentioned plan of care.  We will obtain a preoperative CT abd/pelvis due to her high risk features (serous endometrial cancer).   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  The patient reports 6 months or maybe longer of intermittent vaginal spotting.  She was seen by Dr. 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  The patient has a personal history for being nulliparous with the exception of 1 trimester miscarriage.  She denies hypertension hyperlipidemia diabetes mellitus.  She is overweight with a BMI of 32 kg/m.  Had a knee replacement but no prior abdominal surgeries.  She has no history of anticoagulant use.  Current Meds:  Outpatient Encounter Medications as of 10/01/2018  Medication Sig  . acetaminophen (TYLENOL) 500 MG tablet Take 1,000 mg by mouth every 6 (six) hours as needed for moderate pain.  Marland Kitchen MELATONIN PO Take 1 tablet by mouth at bedtime as needed (sleep).  Marland Kitchen oxybutynin (DITROPAN) 5 MG tablet Take 0.5-1 tablets (2.5-5 mg total) by mouth every 8 (eight) hours as needed for bladder spasms. (Patient taking differently: Take 5 mg by mouth at bedtime. )  . [DISCONTINUED] escitalopram (LEXAPRO) 20 MG tablet Take 1 tablet (20 mg total) by mouth daily. (Patient not taking: Reported on 09/10/2018)   No facility-administered encounter medications on file as of 10/01/2018.     Allergy:  Allergies  Allergen Reactions  . Sulfa Antibiotics  UNSPECIFIED REACTION     Social Hx:   Social History   Socioeconomic History  . Marital status: Married    Spouse name: Not on file  . Number of  children: 0  . Years of education: Not on file  . Highest education level: Bachelor's degree (e.g., BA, AB, BS)  Occupational History  . Not on file  Social Needs  . Financial resource strain: Not hard at all  . Food insecurity:    Worry: Never true    Inability: Never true  . Transportation needs:    Medical: No    Non-medical: No  Tobacco Use  . Smoking status: Never Smoker  . Smokeless tobacco: Never Used  . Tobacco comment: smoked 2 weeks in college  Substance and Sexual Activity  . Alcohol use: No  . Drug use: No  . Sexual activity: Not Currently  Lifestyle  . Physical activity:    Days per week: 0 days    Minutes per session: 0 min  . Stress: Very much  Relationships  . Social connections:    Talks on phone: Never    Gets together: Never    Attends religious service: Never    Active member of club or organization: No    Attends meetings of clubs or organizations: Never    Relationship status: Married  . Intimate partner violence:    Fear of current or ex partner: No    Emotionally abused: No    Physically abused: No    Forced sexual activity: No  Other Topics Concern  . Not on file  Social History Narrative  . Not on file    Past Surgical Hx:  Past Surgical History:  Procedure Laterality Date  . COLONOSCOPY    . EYE SURGERY Bilateral    cataract with lens  . TONSILLECTOMY    . TOOTH EXTRACTION Right 08/27/2017  . TOTAL KNEE ARTHROPLASTY Right 07/30/2016   Procedure: TOTAL KNEE ARTHROPLASTY;  Surgeon: Elsie Saas, MD;  Location: [facility];  Service: Orthopedics;  Laterality: Right;  . VEIN LIGATION AND STRIPPING      Past Medical Hx:  Past Medical History:  Diagnosis Date  . Anxiety   . Arthritis   . Depression   . Endometrial ca ([place])   . Heart murmur    "years ago"  . Insomnia   . Pneumonia    "long time ago"  . Primary localized osteoarthrosis of the knee, right   . Urinary urgency     Past Gynecological History:  See HPI No LMP recorded.  Patient is postmenopausal.  Family Hx:  Family History  Problem Relation Age of Onset  . Heart disease Mother   . COPD Mother   . Cancer Mother        uterine  . Breast cancer Mother   . Heart disease Father   . COPD Brother   . Heart disease Brother   . Cancer Brother   . Cancer Maternal Grandmother        breast  . Stroke Maternal Grandfather     Review of Systems:  Constitutional  Feels well,    ENT Normal appearing ears and nares bilaterally Skin/Breast  No rash, sores, jaundice, itching, dryness Cardiovascular  No chest pain, shortness of breath, or edema  Pulmonary  No cough or wheeze.  Gastro Intestinal  No nausea, vomitting, or diarrhoea. No bright red blood per rectum, no abdominal pain, change in bowel movement, or constipation.  Genito Urinary  No frequency,  urgency, dysuria, + postmenopausal bleeding Musculo Skeletal  No myalgia, arthralgia, joint swelling or pain  Neurologic  No weakness, numbness, change in gait,  Psychology  No depression, anxiety, insomnia.   Vitals:  Blood pressure 138/80, pulse 78, temperature 98.7 F (37.1 C), temperature source Oral, resp. rate 18, height 5\' 4"  (1.626 m), weight 190 lb (86.2 kg), SpO2 100 %.  Physical Exam: WD in NAD Neck  Supple NROM, without any enlargements.  Lymph Node Survey No cervical supraclavicular or inguinal adenopathy Cardiovascular  Pulse normal rate, regularity and rhythm. S1 and S2 normal.  Lungs  Clear to auscultation bilateraly, without wheezes/crackles/rhonchi. Good air movement.  Skin  No rash/lesions/breakdown  Psychiatry  Alert and oriented to person, place, and time  Abdomen  Normoactive bowel sounds, abdomen soft, non-tender and overweight without evidence of hernia.  Back No CVA tenderness Genito Urinary  Vulva/vagina: Normal external female genitalia.   No lesions. No discharge or bleeding.  Bladder/urethra:  No lesions or masses, well supported bladder  Vagina: narrow  introitus and upper vagina.   Cervix: Normal appearing, no lesions.  Uterus:  Small, mobile, no parametrial involvement or nodularity.  Adnexa: no discrete masses. Rectal  deferred Extremities  No bilateral cyanosis, clubbing or edema.   Thereasa Solo, MD  10/01/2018, 12:24 PM

## 2018-10-02 ENCOUNTER — Other Ambulatory Visit: Payer: PPO

## 2018-10-02 ENCOUNTER — Encounter: Payer: Self-pay | Admitting: Obstetrics & Gynecology

## 2018-10-02 ENCOUNTER — Ambulatory Visit: Payer: PPO | Admitting: Obstetrics & Gynecology

## 2018-10-03 ENCOUNTER — Ambulatory Visit (HOSPITAL_COMMUNITY)
Admission: RE | Admit: 2018-10-03 | Discharge: 2018-10-03 | Disposition: A | Discharge status: Home / Self Care | Payer: PPO | Source: Ambulatory Visit | Attending: Gynecologic Oncology | Admitting: Gynecologic Oncology

## 2018-10-03 ENCOUNTER — Telehealth: Payer: Self-pay

## 2018-10-03 ENCOUNTER — Encounter (HOSPITAL_COMMUNITY): Payer: Self-pay

## 2018-10-03 ENCOUNTER — Other Ambulatory Visit: Payer: Self-pay

## 2018-10-03 DIAGNOSIS — C541 Malignant neoplasm of endometrium: Secondary | ICD-10-CM | POA: Diagnosis not present

## 2018-10-03 DIAGNOSIS — D259 Leiomyoma of uterus, unspecified: Secondary | ICD-10-CM | POA: Diagnosis not present

## 2018-10-03 DIAGNOSIS — K449 Diaphragmatic hernia without obstruction or gangrene: Secondary | ICD-10-CM | POA: Diagnosis not present

## 2018-10-03 DIAGNOSIS — D4959 Neoplasm of unspecified behavior of other genitourinary organ: Secondary | ICD-10-CM | POA: Diagnosis not present

## 2018-10-03 MED ORDER — SODIUM CHLORIDE (PF) 0.9 % IJ SOLN
INTRAMUSCULAR | Status: AC
  Filled 2018-10-03: qty 50

## 2018-10-03 MED ORDER — IOHEXOL 300 MG/ML  SOLN
100.0000 mL | Freq: Once | INTRAMUSCULAR | Status: AC | PRN
  Administered 2018-10-03: 100 mL via INTRAVENOUS

## 2018-10-03 NOTE — Telephone Encounter (Signed)
Told Zoe Kim that there is no evidence of extra uterine tumor extension or metastatic disease. Thre is a tiny fibroid in the uterus. A Small hiatal hernia. Aortic Atherosclerosis per [name], NP.

## 2018-10-07 NOTE — Progress Notes (Addendum)
Late entry: Call made on 10/07/18   SPOKE W/  Patient     SCREENING SYMPTOMS OF COVID 19:   COUGH--No  RUNNY NOSE---No   SORE THROAT---No  NASAL CONGESTION----No  SNEEZING----No  SHORTNESS OF BREATH---No  DIFFICULTY BREATHING---No  TEMP >100.0 -----No  UNEXPLAINED BODY ACHES------No  CHILLS -------- No  HEADACHES ---------No  LOSS OF SMELL/ TASTE --------No    HAVE YOU OR ANY FAMILY MEMBER TRAVELLED PAST 14 DAYS OUT OF THE   COUNTY--- Pt lives in [place]. She has been coming to [facility] for appointments only STATE----No COUNTRY----No  HAVE YOU OR ANY FAMILY MEMBER BEEN EXPOSED TO ANYONE WITH COVID 19? No

## 2018-10-07 NOTE — Patient Instructions (Addendum)
Zoe Kim  10/07/2018   Your procedure is scheduled on: 10-14-18    Report to [facility] ([facility]) Main  Entrance    Report to Admitting at 5:30 AM    Call this number if you have problems the morning of surgery [phone]    Remember: NO SOLID FOOD AFTER MIDNIGHT THE NIGHT PRIOR TO SURGERY. YOU CAN HAVE A CLEAR LIQUID UNTIL 3 HOURS PRIOR TO SCHEDULED SURGERY. PLEASE STOP ANY CLEAR LIQUIDS BY 5:00 AM   CLEAR LIQUID DIET   Foods Allowed                                                                     Foods Excluded  Coffee and tea, regular and decaf                             liquids that you cannot  Plain Jell-O in any flavor                                             see through such as: Fruit ices (not with fruit pulp)                                     milk, soups, orange juice  Iced Popsicles                                    All solid food Carbonated beverages, regular and diet                                    Cranberry, grape and apple juices Sports drinks like Gatorade Lightly seasoned clear broth or consume(fat free) Sugar, honey syrup  Sample Menu Breakfast                                Lunch                                     Supper Cranberry juice                    Beef broth                            Chicken broth Jell-O                                     Grape juice  Apple juice Coffee or tea                        Jell-O                                      Popsicle                                                Coffee or tea                        Coffee or tea  _____________________________________________________________________   BRUSH YOUR TEETH MORNING OF SURGERY AND RINSE YOUR MOUTH OUT, NO CHEWING GUM CANDY OR MINTS.     Take these medicines the morning of surgery with A SIP OF WATER: Oxycodone, prn                               You may not have any metal on your body including hair pins  and              piercings  Do not wear jewelry, make-up, lotions, powders or perfumes, deodorant             Do not wear nail polish.  Do not shave  48 hours prior to surgery.     Do not bring valuables to the hospital. Zoe Kim.  Contacts, dentures or bridgework may not be worn into surgery.     Patients discharged the day of surgery will not be allowed to drive home. IF YOU ARE HAVING SURGERY AND GOING HOME THE SAME DAY, YOU MUST HAVE AN ADULT TO DRIVE YOU HOME AND BE WITH YOU FOR 24 HOURS. YOU MAY GO HOME BY TAXI OR UBER OR ORTHERWISE, BUT AN ADULT MUST ACCOMPANY YOU HOME AND STAY WITH YOU FOR 24 HOURS.    Name and phone number of your driver: Zoe Kim [phone]    Special Instructions: Cough and Deep Breath Exercises,              Please read over the following fact sheets you were given: _____________________________________________________________________             [facility] - Preparing for Surgery Before surgery, you can play an important role.  Because skin is not sterile, your skin needs to be as free of germs as possible.  You can reduce the number of germs on your skin by washing with CHG (chlorahexidine gluconate) soap before surgery.  CHG is an antiseptic cleaner which kills germs and bonds with the skin to continue killing germs even after washing. Please DO NOT use if you have an allergy to CHG or antibacterial soaps.  If your skin becomes reddened/irritated stop using the CHG and inform your nurse when you arrive at Short Stay. Do not shave (including legs and underarms) for at least 48 hours prior to the first CHG shower.  You may shave your face/neck. Please follow these instructions carefully:  1.  Shower with CHG Soap the night before surgery and the  morning of Surgery.  2.  If you choose to wash your hair, wash your hair first as usual with your  normal  shampoo.  3.  After you shampoo, rinse your hair and  body thoroughly to remove the  shampoo.                           4.  Use CHG as you would any other liquid soap.  You can apply chg directly  to the skin and wash                       Gently with a scrungie or clean washcloth.  5.  Apply the CHG Soap to your body ONLY FROM THE NECK DOWN.   Do not use on face/ open                           Wound or open sores. Avoid contact with eyes, ears mouth and genitals (private parts).                       Wash face,  Genitals (private parts) with your normal soap.             6.  Wash thoroughly, paying special attention to the area where your surgery  will be performed.  7.  Thoroughly rinse your body with warm water from the neck down.  8.  DO NOT shower/wash with your normal soap after using and rinsing off  the CHG Soap.                9.  Pat yourself dry with a clean towel.            10.  Wear clean pajamas.            11.  Place clean sheets on your bed the night of your first shower and do not  sleep with pets. Day of Surgery : Do not apply any lotions/deodorants the morning of surgery.  Please wear clean clothes to the hospital/surgery center.  FAILURE TO FOLLOW THESE INSTRUCTIONS MAY RESULT IN THE CANCELLATION OF YOUR SURGERY PATIENT SIGNATURE_________________________________  NURSE SIGNATURE__________________________________  ________________________________________________________________________   Zoe Kim  An incentive spirometer is a tool that can help keep your lungs clear and active. This tool measures how well you are filling your lungs with each breath. Taking long deep breaths may help reverse or decrease the chance of developing breathing (pulmonary) problems (especially infection) following:  A long period of time when you are unable to move or be active. BEFORE THE PROCEDURE   If the spirometer includes an indicator to show your best effort, your nurse or respiratory therapist will set it to a desired  goal.  If possible, sit up straight or lean slightly forward. Try not to slouch.  Hold the incentive spirometer in an upright position. INSTRUCTIONS FOR USE  1. Sit on the edge of your bed if possible, or sit up as far as you can in bed or on a chair. 2. Hold the incentive spirometer in an upright position. 3. Breathe out normally. 4. Place the mouthpiece in your mouth and seal your lips tightly around it. 5. Breathe in slowly and as deeply as possible, raising the piston or the ball toward the top of the column. 6. Hold your breath for 3-5 seconds or for as long as possible. Allow the piston  or ball to fall to the bottom of the column. 7. Remove the mouthpiece from your mouth and breathe out normally. 8. Rest for a few seconds and repeat Steps 1 through 7 at least 10 times every 1-2 hours when you are awake. Take your time and take a few normal breaths between deep breaths. 9. The spirometer may include an indicator to show your best effort. Use the indicator as a goal to work toward during each repetition. 10. After each set of 10 deep breaths, practice coughing to be sure your lungs are clear. If you have an incision (the cut made at the time of surgery), support your incision when coughing by placing a pillow or rolled up towels firmly against it. Once you are able to get out of bed, walk around indoors and cough well. You may stop using the incentive spirometer when instructed by your caregiver.  RISKS AND COMPLICATIONS  Take your time so you do not get dizzy or light-headed.  If you are in pain, you may need to take or ask for pain medication before doing incentive spirometry. It is harder to take a deep breath if you are having pain. AFTER USE  Rest and breathe slowly and easily.  It can be helpful to keep track of a log of your progress. Your caregiver can provide you with a simple table to help with this. If you are using the spirometer at home, follow these instructions: [place] IF:   You are having difficultly using the spirometer.  You have trouble using the spirometer as often as instructed.  Your pain medication is not giving enough relief while using the spirometer.  You develop fever of 100.5 F (38.1 C) or higher. SEEK IMMEDIATE MEDICAL CARE IF:   You cough up bloody sputum that had not been present before.  You develop fever of 102 F (38.9 C) or greater.  You develop worsening pain at or near the incision site. MAKE SURE YOU:   Understand these instructions.  Will watch your condition.  Will get help right away if you are not doing well or get worse. Document Released: 10/01/2006 Document Revised: 08/13/2011 Document Reviewed: 12/02/2006 Exit[employer] Patient Information 2014 Exit[employer], [place].   ________________________________________________________________________  WHAT IS A BLOOD TRANSFUSION? Blood Transfusion Information  A transfusion is the replacement of blood or some of its parts. Blood is made up of multiple cells which provide different functions.  Red blood cells carry oxygen and are used for blood loss replacement.  White blood cells fight against infection.  Platelets control bleeding.  Plasma helps clot blood.  Other blood products are available for specialized needs, such as hemophilia or other clotting disorders. BEFORE THE TRANSFUSION  Who gives blood for transfusions?   Healthy volunteers who are fully evaluated to make sure their blood is safe. This is blood bank blood. Transfusion therapy is the safest it has ever been in the practice of medicine. Before blood is taken from a donor, a complete history is taken to make sure that person has no history of diseases nor engages in risky social behavior (examples are intravenous drug use or sexual activity with multiple partners). The donor's travel history is screened to minimize risk of transmitting infections, such as malaria. The donated blood is tested for  signs of infectious diseases, such as HIV and hepatitis. The blood is then tested to be sure it is compatible with you in order to minimize the chance of a transfusion reaction. If you  or a relative donates blood, this is often done in anticipation of surgery and is not appropriate for emergency situations. It takes many days to process the donated blood. RISKS AND COMPLICATIONS Although transfusion therapy is very safe and saves many lives, the main dangers of transfusion include:   Getting an infectious disease.  Developing a transfusion reaction. This is an allergic reaction to something in the blood you were given. Every precaution is taken to prevent this. The decision to have a blood transfusion has been considered carefully by your caregiver before blood is given. Blood is not given unless the benefits outweigh the risks. AFTER THE TRANSFUSION  Right after receiving a blood transfusion, you will usually feel much better and more energetic. This is especially true if your red blood cells have gotten low (anemic). The transfusion raises the level of the red blood cells which carry oxygen, and this usually causes an energy increase.  The nurse administering the transfusion will monitor you carefully for complications. HOME CARE INSTRUCTIONS  No special instructions are needed after a transfusion. You may find your energy is better. Speak with your caregiver about any limitations on activity for underlying diseases you may have. SEEK MEDICAL CARE IF:   Your condition is not improving after your transfusion.  You develop redness or irritation at the intravenous (IV) site. SEEK IMMEDIATE MEDICAL CARE IF:  Any of the following symptoms occur over the next 12 hours:  Shaking chills.  You have a temperature by mouth above 102 F (38.9 C), not controlled by medicine.  Chest, back, or muscle pain.  People around you feel you are not acting correctly or are confused.  Shortness of breath  or difficulty breathing.  Dizziness and fainting.  You get a rash or develop hives.  You have a decrease in urine output.  Your urine turns a dark color or changes to pink, red, or brown. Any of the following symptoms occur over the next 10 days:  You have a temperature by mouth above 102 F (38.9 C), not controlled by medicine.  Shortness of breath.  Weakness after normal activity.  The white part of the eye turns yellow (jaundice).  You have a decrease in the amount of urine or are urinating less often.  Your urine turns a dark color or changes to pink, red, or brown. Document Released: 05/18/2000 Document Revised: 08/13/2011 Document Reviewed: 01/05/2008 [facility] Patient Information 2014 [place], [place].  _______________________________________________________________________

## 2018-10-09 ENCOUNTER — Encounter (HOSPITAL_COMMUNITY)
Admission: RE | Admit: 2018-10-09 | Discharge: 2018-10-09 | Disposition: A | Discharge status: Home / Self Care | Payer: PPO | Source: Ambulatory Visit | Attending: Gynecologic Oncology | Admitting: Gynecologic Oncology

## 2018-10-09 ENCOUNTER — Other Ambulatory Visit: Payer: Self-pay

## 2018-10-09 ENCOUNTER — Encounter (HOSPITAL_COMMUNITY): Payer: Self-pay

## 2018-10-09 ENCOUNTER — Other Ambulatory Visit (HOSPITAL_COMMUNITY)
Admission: RE | Admit: 2018-10-09 | Discharge: 2018-10-09 | Disposition: A | Discharge status: Home / Self Care | Payer: PPO | Source: Ambulatory Visit | Attending: Gynecologic Oncology | Admitting: Gynecologic Oncology

## 2018-10-09 DIAGNOSIS — Z1159 Encounter for screening for other viral diseases: Secondary | ICD-10-CM | POA: Diagnosis not present

## 2018-10-09 DIAGNOSIS — C541 Malignant neoplasm of endometrium: Secondary | ICD-10-CM | POA: Insufficient documentation

## 2018-10-09 DIAGNOSIS — Z01812 Encounter for preprocedural laboratory examination: Secondary | ICD-10-CM | POA: Diagnosis not present

## 2018-10-09 LAB — CBC
HCT: 41.4 % (ref 36.0–46.0)
Hemoglobin: 13.6 g/dL (ref 12.0–15.0)
MCH: 29.4 pg (ref 26.0–34.0)
MCHC: 32.9 g/dL (ref 30.0–36.0)
MCV: 89.4 fL (ref 80.0–100.0)
Platelets: 284 10*3/uL (ref 150–400)
RBC: 4.63 MIL/uL (ref 3.87–5.11)
RDW: 13.1 % (ref 11.5–15.5)
WBC: 7.2 10*3/uL (ref 4.0–10.5)
nRBC: 0 % (ref 0.0–0.2)

## 2018-10-09 LAB — URINALYSIS, ROUTINE W REFLEX MICROSCOPIC
Bilirubin Urine: NEGATIVE
Glucose, UA: NEGATIVE mg/dL
Ketones, ur: NEGATIVE mg/dL
Nitrite: NEGATIVE
Protein, ur: NEGATIVE mg/dL
Specific Gravity, Urine: 1.025 (ref 1.005–1.030)
WBC, UA: >50 WBC/hpf — ABNORMAL HIGH (ref 0–5)
pH: 5 (ref 5.0–8.0)

## 2018-10-09 LAB — ABO/RH: ABO/RH(D): O POS

## 2018-10-09 NOTE — Progress Notes (Signed)
Anesthesia Chart Review   Case:  [id number] Date/Time:  10/14/18 0730   Procedures:      ROBOTIC ASSISTED TOTAL HYSTERECTOMY WITH BILATERAL SALPINGO OOPHORECTOMY, POSSIBLE MINI LAPEROTOMY (N/A )     SENTINEL LYMPH NODE BIOPSY (N/A )   Anesthesia type:  General   Pre-op diagnosis:  ENDOMETRIAL CANCER   Location:  [place] [facility] / [facility]   Surgeon:  Everitt Amber, MD      DISCUSSION: 73 yo never smoker with h/o anxiety, depression, insomnia, endometrial cancer scheduled for above procedure 10/14/18 with Dr. Everitt Amber.   Urinalysis results forwarded to [name], NP.  Pt can proceed with planned procedure barring acute status change.  VS: BP (!) 146/76 (BP Location: Right Arm)   Pulse 73   Temp 36.6 C (Oral)   Resp 18   Ht 5\' 4"  (1.626 m)   Wt 85.5 kg   SpO2 100%   BMI 32.36 kg/m   PROVIDERS: [name], MD is PCP    LABS: Labs reviewed: Acceptable for surgery. (all labs ordered are listed, but only abnormal results are displayed)  Labs Reviewed  URINALYSIS, ROUTINE W REFLEX MICROSCOPIC - Abnormal; Notable for the following components:      Result Value   Hgb urine dipstick SMALL (*)    Leukocytes,Ua MODERATE (*)    WBC, UA >50 (*)    Bacteria, UA RARE (*)    All other components within normal limits  CBC  TYPE AND SCREEN  ABO/RH     IMAGES: CT Abdomen Pelvis 10/03/2018 IMPRESSION: 1. Enhancing soft tissue density in endometrial cavity, consistent with history of endometrial carcinoma. No evidence of extra uterine tumor extension or metastatic disease. 2. Tiny less than 1 cm anterior uterine fibroid. 3. Small hiatal hernia.  EKG: 01/10/2017 Rate 72 bpm Sinus rhythm  Within normal limits   CV: Stress Test 06/26/2012 Conclusions: Negative for ischemia.  Hypertensive BP response.  Markedly reduced aerobic tolerance. Ex time 3 minutes.   Echo 06/23/2012 Conclusion:  1. Left ventricular cavity is normal in size.  Normal global wall motion.  Normal systolic  global function.  Calculated EF 69%.  Abnormal relaxation.  2. Mitral valve structurally normal.  Mitral valve inflow A > E ratio.  3. Tricuspid valve structurally normal.  Trace tricuspid regurgitation.  Past Medical History:  Diagnosis Date  . Anxiety   . Arthritis   . Depression   . Endometrial ca ([place])   . Heart murmur    "years ago"  . Insomnia   . Pneumonia    "long time ago"  . Primary localized osteoarthrosis of the knee, right   . Urinary urgency     Past Surgical History:  Procedure Laterality Date  . COLONOSCOPY    . EYE SURGERY Bilateral    cataract with lens  . TONSILLECTOMY    . TOOTH EXTRACTION Right 08/27/2017  . TOTAL KNEE ARTHROPLASTY Right 07/30/2016   Procedure: TOTAL KNEE ARTHROPLASTY;  Surgeon: Elsie Saas, MD;  Location: [place];  Service: Orthopedics;  Laterality: Right;  . VEIN LIGATION AND STRIPPING      MEDICATIONS: . acetaminophen (TYLENOL) 500 MG tablet  . ibuprofen (ADVIL) 600 MG tablet  . MELATONIN PO  . oxybutynin (DITROPAN) 5 MG tablet  . oxyCODONE (OXY IR/ROXICODONE) 5 MG immediate release tablet  . senna-docusate (SENOKOT-S) 8.6-50 MG tablet   No current facility-administered medications for this encounter.    Maia Plan [facility] Pre-Surgical Testing [phone] 10/10/18 9:51 AM

## 2018-10-09 NOTE — Progress Notes (Signed)
UA result routed to Dr. Denman George for review

## 2018-10-10 LAB — URINE CULTURE: Culture: NO GROWTH

## 2018-10-10 LAB — NOVEL CORONAVIRUS, NAA (HOSP ORDER, SEND-OUT TO REF LAB; TAT 18-24 HRS): SARS-CoV-2, NAA: NOT DETECTED

## 2018-10-10 NOTE — Anesthesia Preprocedure Evaluation (Addendum)
Anesthesia Evaluation  Patient identified by MRN, date of birth, ID band Patient awake    Reviewed: Allergy & Precautions, H&P , NPO status , Patient's Chart, lab work & pertinent test results, reviewed documented beta blocker date and time   Airway Mallampati: II  TM Distance: >3 FB Neck ROM: full    Dental no notable dental hx.    Pulmonary neg pulmonary ROS,    Pulmonary exam normal breath sounds clear to auscultation       Cardiovascular Exercise Tolerance: Good negative cardio ROS   Rhythm:regular Rate:Normal  EKG: 01/10/2017 Rate 72 bpm Sinus rhythm  Within normal limits   CV: Stress Test 06/26/2012 Conclusions: Negative for ischemia.  Hypertensive BP response.  Markedly reduced aerobic tolerance. Ex time 3 minutes.   Echo 06/23/2012 1. Left ventricular cavity is normal in size.  Normal global wall motion.  Normal systolic global function.  Calculated EF 69%.  Abnormal relaxation.  2. Mitral valve structurally normal.  Mitral valve inflow A > E ratio.  3. Tricuspid valve structurally normal.  Trace tricuspid regurgitation.    Neuro/Psych PSYCHIATRIC DISORDERS Anxiety Depression negative neurological ROS     GI/Hepatic negative GI ROS, Neg liver ROS,   Endo/Other  negative endocrine ROS  Renal/GU negative Renal ROS  negative genitourinary   Musculoskeletal  (+) Arthritis , Osteoarthritis,    Abdominal   Peds  Hematology negative hematology ROS (+)   Anesthesia Other Findings   Reproductive/Obstetrics negative OB ROS                             Anesthesia Physical Anesthesia Plan  ASA: III  Anesthesia Plan: General   Post-op Pain Management:    Induction:   PONV Risk Score and Plan: Ondansetron, Dexamethasone and Treatment may vary due to age or medical condition  Airway Management Planned: Oral ETT  Additional Equipment:   Intra-op Plan:   Post-operative Plan:    Informed Consent: I have reviewed the patients History and Physical, chart, labs and discussed the procedure including the risks, benefits and alternatives for the proposed anesthesia with the patient or authorized representative who has indicated his/her understanding and acceptance.     Dental Advisory Given  Plan Discussed with: CRNA, Anesthesiologist and Surgeon  Anesthesia Plan Comments: (See PAT note 10/09/18, Konrad Felix, PA-C)       Anesthesia Quick Evaluation

## 2018-10-13 ENCOUNTER — Other Ambulatory Visit: Payer: Self-pay

## 2018-10-13 NOTE — Progress Notes (Signed)
SPOKE W/  Ashtyn Terrien     SCREENING SYMPTOMS OF COVID 19:   COUGH--no  RUNNY NOSE--- no  SORE THROAT---no  NASAL CONGESTION----no  SNEEZING----no  SHORTNESS OF BREATH---no  DIFFICULTY BREATHING---no  TEMP >100.0 -----no  UNEXPLAINED BODY ACHES------no  CHILLS -------- no  HEADACHES --------- no  LOSS OF SMELL/ TASTE -------- no    HAVE YOU OR ANY FAMILY MEMBER TRAVELLED PAST 14 DAYS OUT OF THE   COUNTY---no STATE----no COUNTRY----no  HAVE YOU OR ANY FAMILY MEMBER BEEN EXPOSED TO ANYONE WITH COVID 19? no

## 2018-10-14 ENCOUNTER — Encounter (HOSPITAL_COMMUNITY): Payer: Self-pay | Admitting: *Deleted

## 2018-10-14 ENCOUNTER — Encounter (HOSPITAL_COMMUNITY)
Admission: RE | Disposition: A | Discharge status: Home / Self Care | Payer: Self-pay | Source: Other Acute Inpatient Hospital | Attending: Gynecologic Oncology

## 2018-10-14 ENCOUNTER — Ambulatory Visit (HOSPITAL_COMMUNITY): Payer: PPO | Admitting: Physician Assistant

## 2018-10-14 ENCOUNTER — Ambulatory Visit (HOSPITAL_COMMUNITY)
Admission: RE | Admit: 2018-10-14 | Discharge: 2018-10-14 | Disposition: A | Discharge status: Home / Self Care | Payer: PPO | Source: Other Acute Inpatient Hospital | Attending: Gynecologic Oncology | Admitting: Gynecologic Oncology

## 2018-10-14 DIAGNOSIS — E669 Obesity, unspecified: Secondary | ICD-10-CM | POA: Diagnosis not present

## 2018-10-14 DIAGNOSIS — C641 Malignant neoplasm of right kidney, except renal pelvis: Secondary | ICD-10-CM | POA: Diagnosis not present

## 2018-10-14 DIAGNOSIS — C775 Secondary and unspecified malignant neoplasm of intrapelvic lymph nodes: Secondary | ICD-10-CM

## 2018-10-14 DIAGNOSIS — C541 Malignant neoplasm of endometrium: Secondary | ICD-10-CM

## 2018-10-14 DIAGNOSIS — E6609 Other obesity due to excess calories: Secondary | ICD-10-CM | POA: Diagnosis not present

## 2018-10-14 DIAGNOSIS — Z96651 Presence of right artificial knee joint: Secondary | ICD-10-CM | POA: Diagnosis not present

## 2018-10-14 DIAGNOSIS — Z6832 Body mass index (BMI) 32.0-32.9, adult: Secondary | ICD-10-CM | POA: Diagnosis not present

## 2018-10-14 DIAGNOSIS — Z8049 Family history of malignant neoplasm of other genital organs: Secondary | ICD-10-CM | POA: Insufficient documentation

## 2018-10-14 DIAGNOSIS — Z6830 Body mass index (BMI) 30.0-30.9, adult: Secondary | ICD-10-CM

## 2018-10-14 DIAGNOSIS — G4700 Insomnia, unspecified: Secondary | ICD-10-CM | POA: Diagnosis not present

## 2018-10-14 DIAGNOSIS — M1711 Unilateral primary osteoarthritis, right knee: Secondary | ICD-10-CM | POA: Diagnosis not present

## 2018-10-14 DIAGNOSIS — R3915 Urgency of urination: Secondary | ICD-10-CM | POA: Diagnosis not present

## 2018-10-14 HISTORY — PX: SENTINEL NODE BIOPSY: SHX6608

## 2018-10-14 HISTORY — DX: Obesity, unspecified: E66.9

## 2018-10-14 HISTORY — PX: ROBOTIC ASSISTED TOTAL HYSTERECTOMY WITH BILATERAL SALPINGO OOPHERECTOMY: SHX6086

## 2018-10-14 LAB — TYPE AND SCREEN
ABO/RH(D): O POS
Antibody Screen: NEGATIVE

## 2018-10-14 SURGERY — ROBOTIC ASSISTED TOTAL HYSTERECTOMY WITH BILATERAL SALPINGO OOPHORECTOMY
Anesthesia: General | Site: Abdomen

## 2018-10-14 MED ORDER — OXYCODONE HCL 5 MG/5ML PO SOLN: 5.0000 mg | Freq: Once | ORAL | Status: DC | PRN

## 2018-10-14 MED ORDER — ONDANSETRON HCL 4 MG/2ML IJ SOLN
INTRAMUSCULAR | Status: AC
  Filled 2018-10-14: qty 2

## 2018-10-14 MED ORDER — ONDANSETRON HCL 4 MG/2ML IJ SOLN: 4.0000 mg | Freq: Once | INTRAMUSCULAR | Status: DC | PRN

## 2018-10-14 MED ORDER — SUCCINYLCHOLINE CHLORIDE 200 MG/10ML IV SOSY
PREFILLED_SYRINGE | INTRAVENOUS | Status: AC
  Filled 2018-10-14: qty 10

## 2018-10-14 MED ORDER — DEXAMETHASONE SODIUM PHOSPHATE 4 MG/ML IJ SOLN
4.0000 mg | INTRAMUSCULAR | Status: AC
  Administered 2018-10-14: 08:00:00 8 mg via INTRAVENOUS

## 2018-10-14 MED ORDER — PROPOFOL 10 MG/ML IV BOLUS
INTRAVENOUS | Status: DC | PRN
  Administered 2018-10-14: 150 mg via INTRAVENOUS

## 2018-10-14 MED ORDER — FENTANYL CITRATE (PF) 100 MCG/2ML IJ SOLN
INTRAMUSCULAR | Status: AC
  Filled 2018-10-14: qty 2

## 2018-10-14 MED ORDER — ACETAMINOPHEN 650 MG RE SUPP
650.0000 mg | RECTAL | Status: DC | PRN
  Filled 2018-10-14: qty 1

## 2018-10-14 MED ORDER — STERILE WATER FOR INJECTION IJ SOLN
INTRAMUSCULAR | Status: DC | PRN
  Administered 2018-10-14: 10 mL via INTRAMUSCULAR

## 2018-10-14 MED ORDER — LIDOCAINE 2% (20 MG/ML) 5 ML SYRINGE
INTRAMUSCULAR | Status: AC
  Filled 2018-10-14: qty 5

## 2018-10-14 MED ORDER — BUPIVACAINE HCL 0.25 % IJ SOLN
INTRAMUSCULAR | Status: DC | PRN
  Administered 2018-10-14: 16 mL

## 2018-10-14 MED ORDER — DEXAMETHASONE SODIUM PHOSPHATE 10 MG/ML IJ SOLN
INTRAMUSCULAR | Status: AC
  Filled 2018-10-14: qty 1

## 2018-10-14 MED ORDER — ROCURONIUM BROMIDE 10 MG/ML (PF) SYRINGE
PREFILLED_SYRINGE | INTRAVENOUS | Status: DC | PRN
  Administered 2018-10-14: 10 mg via INTRAVENOUS
  Administered 2018-10-14: 50 mg via INTRAVENOUS

## 2018-10-14 MED ORDER — CEFAZOLIN SODIUM-DEXTROSE 2-4 GM/100ML-% IV SOLN
2.0000 g | INTRAVENOUS | Status: AC
  Administered 2018-10-14: 08:00:00 2 g via INTRAVENOUS
  Filled 2018-10-14: qty 100

## 2018-10-14 MED ORDER — ONDANSETRON HCL 4 MG/2ML IJ SOLN
INTRAMUSCULAR | Status: DC | PRN
  Administered 2018-10-14: 4 mg via INTRAVENOUS

## 2018-10-14 MED ORDER — MIDAZOLAM HCL 2 MG/2ML IJ SOLN
INTRAMUSCULAR | Status: AC
  Filled 2018-10-14: qty 2

## 2018-10-14 MED ORDER — ACETAMINOPHEN 500 MG PO TABS
1000.0000 mg | ORAL_TABLET | ORAL | Status: AC
  Administered 2018-10-14: 1000 mg via ORAL
  Filled 2018-10-14: qty 2

## 2018-10-14 MED ORDER — OXYCODONE HCL 5 MG PO TABS: 5.0000 mg | ORAL_TABLET | ORAL | Status: DC | PRN

## 2018-10-14 MED ORDER — ACETAMINOPHEN 160 MG/5ML PO SOLN: 325.0000 mg | ORAL | Status: DC | PRN

## 2018-10-14 MED ORDER — FENTANYL CITRATE (PF) 250 MCG/5ML IJ SOLN
INTRAMUSCULAR | Status: AC
  Filled 2018-10-14: qty 5

## 2018-10-14 MED ORDER — SODIUM CHLORIDE 0.9% FLUSH: 3.0000 mL | INTRAVENOUS | Status: DC | PRN

## 2018-10-14 MED ORDER — GABAPENTIN 300 MG PO CAPS
300.0000 mg | ORAL_CAPSULE | ORAL | Status: AC
  Administered 2018-10-14: 300 mg via ORAL
  Filled 2018-10-14: qty 1

## 2018-10-14 MED ORDER — MIDAZOLAM HCL 5 MG/5ML IJ SOLN
INTRAMUSCULAR | Status: DC | PRN
  Administered 2018-10-14 (×2): 1 mg via INTRAVENOUS

## 2018-10-14 MED ORDER — OXYCODONE HCL 5 MG PO TABS: 5.0000 mg | ORAL_TABLET | Freq: Once | ORAL | Status: DC | PRN

## 2018-10-14 MED ORDER — LACTATED RINGERS IR SOLN
Status: DC | PRN
  Administered 2018-10-14: 1000 mL

## 2018-10-14 MED ORDER — LACTATED RINGERS IV SOLN
INTRAVENOUS | Status: DC
  Administered 2018-10-14 (×3): via INTRAVENOUS

## 2018-10-14 MED ORDER — ENOXAPARIN SODIUM 40 MG/0.4ML ~~LOC~~ SOLN
40.0000 mg | SUBCUTANEOUS | Status: AC
  Administered 2018-10-14: 40 mg via SUBCUTANEOUS
  Filled 2018-10-14: qty 0.4

## 2018-10-14 MED ORDER — SUGAMMADEX SODIUM 200 MG/2ML IV SOLN
INTRAVENOUS | Status: DC | PRN
  Administered 2018-10-14: 200 mg via INTRAVENOUS

## 2018-10-14 MED ORDER — SUGAMMADEX SODIUM 200 MG/2ML IV SOLN
INTRAVENOUS | Status: AC
  Filled 2018-10-14: qty 2

## 2018-10-14 MED ORDER — ACETAMINOPHEN 325 MG PO TABS: 325.0000 mg | ORAL_TABLET | ORAL | Status: DC | PRN

## 2018-10-14 MED ORDER — SODIUM CHLORIDE 0.9% FLUSH: 3.0000 mL | Freq: Two times a day (BID) | INTRAVENOUS | Status: DC

## 2018-10-14 MED ORDER — LIDOCAINE 2% (20 MG/ML) 5 ML SYRINGE
INTRAMUSCULAR | Status: DC | PRN
  Administered 2018-10-14: 100 mg via INTRAVENOUS

## 2018-10-14 MED ORDER — ROCURONIUM BROMIDE 10 MG/ML (PF) SYRINGE
PREFILLED_SYRINGE | INTRAVENOUS | Status: AC
  Filled 2018-10-14: qty 10

## 2018-10-14 MED ORDER — MORPHINE SULFATE (PF) 4 MG/ML IV SOLN: 2.0000 mg | INTRAVENOUS | Status: DC | PRN

## 2018-10-14 MED ORDER — BUPIVACAINE LIPOSOME 1.3 % IJ SUSP
20.0000 mL | Freq: Once | INTRAMUSCULAR | Status: DC
  Filled 2018-10-14: qty 20

## 2018-10-14 MED ORDER — STERILE WATER FOR INJECTION IJ SOLN
INTRAMUSCULAR | Status: AC
  Filled 2018-10-14: qty 10

## 2018-10-14 MED ORDER — FENTANYL CITRATE (PF) 100 MCG/2ML IJ SOLN
25.0000 ug | INTRAMUSCULAR | Status: DC | PRN
  Administered 2018-10-14 (×2): 50 ug via INTRAVENOUS

## 2018-10-14 MED ORDER — MEPERIDINE HCL 50 MG/ML IJ SOLN: 6.2500 mg | INTRAMUSCULAR | Status: DC | PRN

## 2018-10-14 MED ORDER — SCOPOLAMINE 1 MG/3DAYS TD PT72
1.0000 | MEDICATED_PATCH | TRANSDERMAL | Status: DC
  Administered 2018-10-14: 1.5 mg via TRANSDERMAL
  Filled 2018-10-14: qty 1

## 2018-10-14 MED ORDER — FENTANYL CITRATE (PF) 100 MCG/2ML IJ SOLN
INTRAMUSCULAR | Status: DC | PRN
  Administered 2018-10-14 (×5): 50 ug via INTRAVENOUS

## 2018-10-14 MED ORDER — PROPOFOL 10 MG/ML IV BOLUS
INTRAVENOUS | Status: AC
  Filled 2018-10-14: qty 20

## 2018-10-14 MED ORDER — BUPIVACAINE HCL (PF) 0.25 % IJ SOLN
INTRAMUSCULAR | Status: AC
  Filled 2018-10-14: qty 30

## 2018-10-14 MED ORDER — STERILE WATER FOR IRRIGATION IR SOLN
Status: DC | PRN
  Administered 2018-10-14: 1000 mL

## 2018-10-14 MED ORDER — ACETAMINOPHEN 325 MG PO TABS: 650.0000 mg | ORAL_TABLET | ORAL | Status: DC | PRN

## 2018-10-14 MED ORDER — SODIUM CHLORIDE 0.9 % IV SOLN: 250.0000 mL | INTRAVENOUS | Status: DC | PRN

## 2018-10-14 SURGICAL SUPPLY — 58 items
APPLICATOR SURGIFLO ENDO (HEMOSTASIS) IMPLANT
BAG LAPAROSCOPIC 12 15 PORT 16 (BASKET) IMPLANT
BAG RETRIEVAL 12/15 (BASKET)
COVER BACK TABLE 60X90IN (DRAPES) ×3 IMPLANT
COVER TIP SHEARS 8 DVNC (MISCELLANEOUS) ×2 IMPLANT
COVER TIP SHEARS 8MM DA VINCI (MISCELLANEOUS) ×1
COVER WAND RF STERILE (DRAPES) IMPLANT
DECANTER SPIKE VIAL GLASS SM (MISCELLANEOUS) ×6 IMPLANT
DERMABOND ADVANCED (GAUZE/BANDAGES/DRESSINGS) ×1
DERMABOND ADVANCED .7 DNX12 (GAUZE/BANDAGES/DRESSINGS) ×2 IMPLANT
DRAPE ARM DVNC X/XI (DISPOSABLE) ×8 IMPLANT
DRAPE COLUMN DVNC XI (DISPOSABLE) ×2 IMPLANT
DRAPE DA VINCI XI ARM (DISPOSABLE) ×4
DRAPE DA VINCI XI COLUMN (DISPOSABLE) ×1
DRAPE SHEET LG 3/4 BI-LAMINATE (DRAPES) ×3 IMPLANT
DRAPE SURG IRRIG POUCH 19X23 (DRAPES) ×3 IMPLANT
ELECT REM PT RETURN 15FT ADLT (MISCELLANEOUS) ×3 IMPLANT
GAUZE 4X4 16PLY RFD (DISPOSABLE) ×6 IMPLANT
GLOVE BIO SURGEON STRL SZ 6 (GLOVE) ×12 IMPLANT
GLOVE BIO SURGEON STRL SZ 6.5 (GLOVE) ×6 IMPLANT
GOWN STRL REUS W/ TWL LRG LVL3 (GOWN DISPOSABLE) ×6 IMPLANT
GOWN STRL REUS W/TWL LRG LVL3 (GOWN DISPOSABLE) ×3
HOLDER FOLEY CATH W/STRAP (MISCELLANEOUS) IMPLANT
IRRIG SUCT STRYKERFLOW 2 WTIP (MISCELLANEOUS) ×3
IRRIGATION SUCT STRKRFLW 2 WTP (MISCELLANEOUS) ×2 IMPLANT
KIT PROCEDURE DA VINCI SI (MISCELLANEOUS) ×1
KIT PROCEDURE DVNC SI (MISCELLANEOUS) ×2 IMPLANT
KIT TURNOVER KIT A (KITS) ×3 IMPLANT
MANIPULATOR UTERINE 4.5 ZUMI (MISCELLANEOUS) ×3 IMPLANT
NEEDLE HYPO 22GX1.5 SAFETY (NEEDLE) IMPLANT
NEEDLE SPNL 18GX3.5 QUINCKE PK (NEEDLE) ×3 IMPLANT
OBTURATOR OPTICAL STANDARD 8MM (TROCAR) ×1
OBTURATOR OPTICAL STND 8 DVNC (TROCAR) ×2
OBTURATOR OPTICALSTD 8 DVNC (TROCAR) ×2 IMPLANT
PACK ROBOT GYN CUSTOM WL (TRAY / TRAY PROCEDURE) ×3 IMPLANT
PAD OB MATERNITY 4.3X12.25 (PERSONAL CARE ITEMS) ×3 IMPLANT
PAD POSITIONING PINK XL (MISCELLANEOUS) ×3 IMPLANT
PORT ACCESS TROCAR AIRSEAL 12 (TROCAR) ×2 IMPLANT
PORT ACCESS TROCAR AIRSEAL 5M (TROCAR) ×1
POUCH SPECIMEN RETRIEVAL 10MM (ENDOMECHANICALS) ×3 IMPLANT
SEAL CANN UNIV 5-8 DVNC XI (MISCELLANEOUS) ×8 IMPLANT
SEAL XI 5MM-8MM UNIVERSAL (MISCELLANEOUS) ×4
SET TRI-LUMEN FLTR TB AIRSEAL (TUBING) ×3 IMPLANT
SURGIFLO W/THROMBIN 8M KIT (HEMOSTASIS) IMPLANT
SUT MNCRL AB 4-0 PS2 18 (SUTURE) IMPLANT
SUT VIC AB 0 CT1 27 (SUTURE) ×2
SUT VIC AB 0 CT1 27XBRD ANTBC (SUTURE) ×4 IMPLANT
SUT VIC AB 2-0 CT2 27 (SUTURE) ×18 IMPLANT
SUT VIC AB 3-0 SH 27 (SUTURE) ×3
SUT VIC AB 3-0 SH 27XBRD (SUTURE) ×6 IMPLANT
SUT VIC AB 4-0 PS2 18 (SUTURE) ×6 IMPLANT
SUT VICRYL 0 UR6 27IN ABS (SUTURE) IMPLANT
SYR 10ML LL (SYRINGE) ×3 IMPLANT
TOWEL OR NON WOVEN STRL DISP B (DISPOSABLE) IMPLANT
TRAP SPECIMEN MUCOUS 40CC (MISCELLANEOUS) IMPLANT
TRAY FOLEY MTR SLVR 16FR STAT (SET/KITS/TRAYS/PACK) ×3 IMPLANT
UNDERPAD 30X30 (UNDERPADS AND DIAPERS) ×3 IMPLANT
WATER STERILE IRR 1000ML POUR (IV SOLUTION) ×3 IMPLANT

## 2018-10-14 NOTE — Op Note (Signed)
OPERATIVE NOTE 10/14/18  Surgeon: Donaciano Eva   Assistants: Dr Lahoma Crocker (an MD assistant was necessary for tissue manipulation, management of robotic instrumentation, retraction and positioning due to the complexity of the case and hospital policies).   Anesthesia: General endotracheal anesthesia  ASA Class: 3   Pre-operative Diagnosis: endometrial cancer grade 3 (serous), obesity  Post-operative Diagnosis: same  Operation: Robotic-assisted laparoscopic total hysterectomy with bilateral salpingoophorectomy, SLN,  biopsy (22 modifier for obesity, BMI 32).  Surgeon: Donaciano Eva  Assistant Surgeon: Lahoma Crocker MD  Anesthesia: GET  Urine Output: 100cc  Operative Findings:  : 8cm arcuate uterus, normal appearing tubes and ovaries. Obesity with BMI 32. Narrow vaginal introitus.    Estimated Blood Loss:  30cc      Total IV Fluids: 800 ml         Specimens: uterus, cervix, bilateral tubes and ovaries, right external iliac SLN, right deep obturator SLN, left external iliac SLN.           Complications:  None; patient tolerated the procedure well.         Disposition: PACU - hemodynamically stable.  Procedure Details  The patient was seen in the Holding Room. The risks, benefits, complications, treatment options, and expected outcomes were discussed with the patient.  The patient concurred with the proposed plan, giving informed consent.  The site of surgery properly noted/marked. The patient was identified as Research scientist (life sciences) and the procedure verified as a Robotic-assisted hysterectomy with bilateral salpingo oophorectomy with SLN biopsy. A Time Out was held and the above information confirmed.  After induction of anesthesia, the patient was draped and prepped in the usual sterile manner. Pt was placed in supine position after anesthesia and draped and prepped in the usual sterile manner. The abdominal drape was placed after the CholoraPrep had  been allowed to dry for 3 minutes.  Her arms were tucked to her side with all appropriate precautions.  The shoulders were stabilized with padded shoulder blocks applied to the acromium processes.  The patient was placed in the semi-lithotomy position in [place].  The perineum was prepped with Betadine. The patient was then prepped. Foley catheter was placed.  A sterile speculum was placed in the vagina.  The cervix was grasped with a single-tooth tenaculum. 2mg  total of ICG was injected into the cervical stroma at 2 and 9 o'clock with 1cc injected at a 1cm and 42mm depth (concentration 0.5mg /ml) in all locations. The cervix was dilated with Kennon Rounds dilators.  The ZUMI uterine manipulator with a small colpotomizer ring was placed without difficulty.  A pneum occluder balloon was placed over the manipulator.  OG tube placement was confirmed and to suction.   Next, a 5 mm skin incision was made 1 cm below the subcostal margin in the midclavicular line.  The 5 mm Optiview port and scope was used for direct entry.  Opening pressure was under 10 mm CO2.  The abdomen was insufflated and the findings were noted as above.   At this point and all points during the procedure, the patient's intra-abdominal pressure did not exceed 15 mmHg. Next, a 10 mm skin incision was made in the umbilicus and a right and left port was placed about 10 cm lateral to the robot port on the right and left side.  A fourth arm was placed in the left lower quadrant 2 cm above and superior and medial to the anterior superior iliac spine.  All ports were placed under direct  visualization.  The patient was placed in steep Trendelenburg.  Bowel was folded away into the upper abdomen.  The robot was docked in the normal manner.  The right and left peritoneum were opened parallel to the IP ligament to open the retroperitoneal spaces bilaterally. The SLN mapping was performed in bilateral pelvic basins. The para rectal and paravesical spaces were  opened up entirely with careful dissection below the level of the ureters bilaterally and to the depth of the uterine artery origin in order to skeletonize the uterine "web" and ensure visualization of all parametrial channels. The para-aortic basins were carefully exposed and evaluated for isolated para-aortic SLN's. Lymphatic channels were identified travelling to the following visualized sentinel lymph node's: right external iliac and deep obturator. These SLN's were separated from their surrounding lymphatic tissue, removed and sent for permanent pathology.  The hysterectomy was started after the round ligament on the right side was incised and the retroperitoneum was entered and the pararectal space was developed.  The ureter was noted to be on the medial leaf of the broad ligament.  The peritoneum above the ureter was incised and stretched and the infundibulopelvic ligament was skeletonized, cauterized and cut.  The posterior peritoneum was taken down to the level of the KOH ring.  The anterior peritoneum was also taken down.  The bladder flap was created to the level of the KOH ring.  The uterine artery on the right side was skeletonized, cauterized and cut in the normal manner.  A similar procedure was performed on the left.  The colpotomy was made and was made in a cruciate fashion to allow delivery of the large uterus through a narrow vagina and the uterus, cervix, bilateral ovaries and tubes were amputated and delivered through the vagina.  Pedicles were inspected and excellent hemostasis was achieved.    The colpotomy at the vaginal cuff was closed with Vicryl on a CT1 needle in a running manner.  Irrigation was used and excellent hemostasis was achieved.  At this point in the procedure was completed.  Robotic instruments were removed under direct visulaization.  The robot was undocked. The 10 mm ports were closed with Vicryl on a UR-5 needle and the fascia was closed with 0 Vicryl on a UR-5 needle.   The skin was closed with 4-0 Vicryl in a subcuticular manner.  Dermabond was applied.  Sponge, lap and needle counts correct x 2.  The patient was taken to the recovery room in stable condition.  The vagina was swabbed with bleeding noted from the distal vaginal side walls and introitus. This was made hemostatic with vicryl suture.   All instrument and needle counts were correct x  3.   The patient was transferred to the recovery room in a  condition.  Donaciano Eva, MD

## 2018-10-14 NOTE — Anesthesia Procedure Notes (Signed)
Procedure Name: Intubation Date/Time: 10/14/2018 7:55 AM Performed by: Vyncent Overby D, CRNA Pre-anesthesia Checklist: Patient identified, Emergency Drugs available, Suction available and Patient being monitored Patient Re-evaluated:Patient Re-evaluated prior to induction Oxygen Delivery Method: Circle system utilized Preoxygenation: Pre-oxygenation with 100% oxygen Induction Type: IV induction Ventilation: Mask ventilation without difficulty Laryngoscope Size: Mac and 4 Grade View: Grade I Tube type: Oral Tube size: 7.5 mm Number of attempts: 1 Airway Equipment and Method: Stylet Placement Confirmation: ETT inserted through vocal cords under direct vision,  positive ETCO2 and breath sounds checked- equal and bilateral Secured at: 22 cm Tube secured with: Tape Dental Injury: Teeth and Oropharynx as per pre-operative assessment

## 2018-10-14 NOTE — Discharge Instructions (Signed)
Return to work: 4 weeks (2 weeks with physical restrictions).  Activity: 1. Be up and out of the bed during the day.  Take a nap if needed.  You may walk up steps but be careful and use the hand rail.  Stair climbing will tire you more than you think, you may need to stop part way and rest.   2. No lifting or straining for 4 weeks.  3. No driving for 1 weeks.  Do Not drive if you are taking narcotic pain medicine.  4. Shower daily.  Use soap and water on your incision and pat dry; don't rub.   5. No sexual activity and nothing in the vagina for 8 weeks.  Medications:  - Take ibuprofen and tylenol first line for pain control. Take these regularly (every 6 hours) to decrease the build up of pain.  - If necessary, for severe pain not relieved by ibuprofen, contact Dr Serita Grit office and you will be prescribed percocet.  - While taking percocet you should take sennakot every night to reduce the likelihood of constipation. If this causes diarrhea, stop its use.  Diet: 1. Low sodium Heart Healthy Diet is recommended.  2. It is safe to use a laxative if you have difficulty moving your bowels.   Wound Care: 1. Keep clean and dry.  Shower daily.  Reasons to call the Doctor:   Fever - Oral temperature greater than 100.4 degrees Fahrenheit  Foul-smelling vaginal discharge  Difficulty urinating  Nausea and vomiting  Increased pain at the site of the incision that is unrelieved with pain medicine.  Difficulty breathing with or without chest pain  New calf pain especially if only on one side  Sudden, continuing increased vaginal bleeding with or without clots.   Follow-up: 1. See Everitt Amber in 4 weeks.  Contacts: For questions or concerns you should contact:  Dr. Everitt Amber at [phone] After hours and on week-ends call [phone] and ask to speak to the physician on call for Gynecologic Oncology   Total Laparoscopic Hysterectomy, Care After This sheet gives you  information about how to care for yourself after your procedure. Your health care provider may also give you more specific instructions. If you have problems or questions, contact your health care provider. What can I expect after the procedure? After the procedure, it is common to have:  Pain and bruising around your incisions.  A sore throat, if a breathing tube was used during surgery.  Fatigue.  Poor appetite.  Less interest in sex. If your ovaries were also removed, it is also common to have symptoms of menopause such as hot flashes, night sweats, and lack of sleep (insomnia). Follow these instructions at home: Bathing  Do not take baths, swim, or use a hot tub until your health care provider approves. You may need to only take showers for 2-3 weeks.  Keep your bandage (dressing) dry until your health care provider says it can be removed. Incision care   Follow instructions from your health care provider about how to take care of your incisions. Make sure you: ? Wash your hands with soap and water before you change your dressing. If soap and water are not available, use hand sanitizer. ? Change your dressing as told by your health care provider. ? Leave stitches (sutures), skin glue, or adhesive strips in place. These skin closures may need to stay in place for 2 weeks or longer. If adhesive strip edges start to loosen and curl  up, you may trim the loose edges. Do not remove adhesive strips completely unless your health care provider tells you to do that.  Check your incision area every day for signs of infection. Check for: ? Redness, swelling, or pain. ? Fluid or blood. ? Warmth. ? Pus or a bad smell. Activity  Get plenty of rest and sleep.  Do not lift anything that is heavier than 10 lbs (4.5 kg) for one month after surgery, or as long as told by your health care provider.  Do not drive or use heavy machinery while taking prescription pain medicine.  Do not drive for  24 hours if you were given a medicine to help you relax (sedative).  Return to your normal activities as told by your health care provider. Ask your health care provider what activities are safe for you. Lifestyle   Do not use any products that contain nicotine or tobacco, such as cigarettes and e-cigarettes. These can delay healing. If you need help quitting, ask your health care provider.  Do not drink alcohol until your health care provider approves. General instructions  Do not douche, use tampons, or have sex for at least 6 weeks, or as told by your health care provider.  Take over-the-counter and prescription medicines only as told by your health care provider.  To monitor yourself for a fever, take your temperature at least once a day during recovery.  If you struggle with physical or emotional changes after your procedure, speak with your health care provider or a therapist.  To prevent or treat constipation while you are taking prescription pain medicine, your health care provider may recommend that you: ? Drink enough fluid to keep your urine clear or pale yellow. ? Take over-the-counter or prescription medicines. ? Eat foods that are high in fiber, such as fresh fruits and vegetables, whole grains, and beans. ? Limit foods that are high in fat and processed sugars, such as fried and sweet foods.  Keep all follow-up visits as told by your health care provider. This is important. Contact a health care provider if:  You have chills or a fever.  You have redness, swelling, or pain around an incision.  You have fluid or blood coming from an incision.  Your incision feels warm to the touch.  You have pus or a bad smell coming from an incision.  An incision breaks open.  You feel dizzy or light-headed.  You have pain or bleeding when you urinate.  You have diarrhea, nausea, or vomiting that does not go away.  You have abnormal vaginal discharge.  You have a  rash.  You have pain that does not get better with medicine. Get help right away if:  You have a fever and your symptoms suddenly get worse.  You have severe abdominal pain.  You have chest pain.  You have shortness of breath.  You faint.  You have pain, swelling, or redness on your leg.  You have heavy vaginal bleeding with blood clots. Summary  After the procedure it is common to have abdominal pain. Your provider will give you medication for this.  Do not take baths, swim, or use a hot tub until your health care provider approves.  Do not lift anything that is heavier than 10 lbs (4.5 kg) for one month after surgery, or as long as told by your health care provider.  Notify your provider if you have any signs or symptoms of infection after the procedure. This information is  not intended to replace advice given to you by your health care provider. Make sure you discuss any questions you have with your health care provider. Document Released: 03/11/2013 Document Revised: 08/01/2016 Document Reviewed: 08/01/2016 Elsevier Interactive Patient Education  2019 [employer].

## 2018-10-14 NOTE — Anesthesia Postprocedure Evaluation (Signed)
Anesthesia Post Note  Patient: Nutritional therapist  Procedure(s) Performed: ROBOTIC ASSISTED TOTAL HYSTERECTOMY WITH BILATERAL SALPINGO OOPHORECTOMY (N/A ) SENTINEL LYMPH NODE BIOPSY (N/A Abdomen)     Patient location during evaluation: PACU Anesthesia Type: General Level of consciousness: awake and alert Pain management: pain level controlled Vital Signs Assessment: post-procedure vital signs reviewed and stable Respiratory status: spontaneous breathing, nonlabored ventilation, respiratory function stable and patient connected to nasal cannula oxygen Cardiovascular status: blood pressure returned to baseline and stable Postop Assessment: no apparent nausea or vomiting Anesthetic complications: no    Last Vitals:  Vitals:   10/14/18 0547 10/14/18 1021  BP: (!) 145/76   Pulse: 95 (P) 89  Resp: 16 (P) 12  Temp: 36.9 C (P) 36.4 C  SpO2: 99% (P) 93%    Last Pain:  Vitals:   10/14/18 1021  TempSrc:   PainSc: (P) Asleep                 Zoe Kim

## 2018-10-14 NOTE — Interval H&P Note (Signed)
History and Physical Interval Note:  10/14/2018 7:07 AM  Zoe Kim  has presented today for surgery, with the diagnosis of ENDOMETRIAL CANCER.  The various methods of treatment have been discussed with the patient and family. After consideration of risks, benefits and other options for treatment, the patient has consented to  Procedure(s): ROBOTIC ASSISTED TOTAL HYSTERECTOMY WITH BILATERAL SALPINGO OOPHORECTOMY, POSSIBLE MINI LAPEROTOMY (N/A) SENTINEL LYMPH NODE BIOPSY (N/A) as a surgical intervention.  The patient's history has been reviewed, patient examined, no change in status, stable for surgery.  I have reviewed the patient's chart and labs.  Questions were answered to the patient's satisfaction.     Thereasa Solo

## 2018-10-14 NOTE — Transfer of Care (Signed)
Immediate Anesthesia Transfer of Care Note  Patient: Zoe Kim  Procedure(s) Performed: ROBOTIC ASSISTED TOTAL HYSTERECTOMY WITH BILATERAL SALPINGO OOPHORECTOMY (N/A ) SENTINEL LYMPH NODE BIOPSY (N/A Abdomen)  Patient Location: PACU  Anesthesia Type:General  Level of Consciousness: awake, alert  and oriented  Airway & Oxygen Therapy: Patient connected to face mask oxygen  Post-op Assessment: Report given to RN and Post -op Vital signs reviewed and stable  Post vital signs: Reviewed and stable  Last Vitals:  Vitals Value Taken Time  BP    Temp    Pulse    Resp    SpO2      Last Pain:  Vitals:   10/14/18 0547  TempSrc: Oral      Patients Stated Pain Goal: 2 (30/73/54 3014)  Complications: No apparent anesthesia complications

## 2018-10-15 ENCOUNTER — Encounter: Payer: Self-pay | Admitting: Gynecologic Oncology

## 2018-10-15 ENCOUNTER — Encounter (HOSPITAL_COMMUNITY): Payer: Self-pay | Admitting: Gynecologic Oncology

## 2018-10-15 NOTE — Telephone Encounter (Signed)
Spoke with Zoe Kim. She is doing very well after her surgery.  She felt good enough yesterday that she could not sleep and got a lot of paperwork done. Taking tylenol alt. with Ibuprofen.  Has not needed any Oxycodone thus far. She is eating and drinking well. No BM yet will begin colace this evening as instructed.  Suggested Miralax 1 capful on Friday in addition to the senokot if No BM tomorrow. Pt urinating well. There is some slight burning.  Told her that the foley may have irritated the urethra.  I the the burning continues and or she runs a low grade temp to call the office to make an appointment to give a urine specimen. Pt know to call the office if she has any questions or concerns.

## 2018-10-17 ENCOUNTER — Telehealth: Payer: Self-pay | Admitting: Gynecologic Oncology

## 2018-10-17 NOTE — Telephone Encounter (Signed)
Informed patient of stage IIIC serous endometrial cancer.   I am recommending 6 cycles of adjuvant carboplatin paclitaxel with vaginal brachii therapy.  I explained this to the patient and her husband who are in agreement.  We will facilitate appointments with Dr. Sondra Come and Dr. Alvy Bimler.  All questions answered  Thereasa Solo, MD

## 2018-10-17 NOTE — Telephone Encounter (Signed)
Hi Zoe Kim,  I will discuss with you Monday about scheduling Also, please ask pathologist to order Her2 on the pelvic LN that is predominantly serous and MSI

## 2018-10-20 ENCOUNTER — Encounter: Payer: Self-pay | Admitting: Oncology

## 2018-10-20 ENCOUNTER — Telehealth: Payer: Self-pay | Admitting: Oncology

## 2018-10-20 DIAGNOSIS — C541 Malignant neoplasm of endometrium: Secondary | ICD-10-CM

## 2018-10-20 NOTE — Telephone Encounter (Signed)
Called [facility] with appointments to see Dr. Alvy Bimler at 10:30 on 10/22/18 and with Dr. Sondra Come on 10/23/18 (2:30 pm nurse eval, 3:00 pm consult).  She verbalized understanding and agreement.

## 2018-10-20 NOTE — Telephone Encounter (Signed)
Requested Her2 testing on tumor and lymph node that is predominately serous and MSI on Accession: [id number] with Amber in [facility].

## 2018-10-22 ENCOUNTER — Encounter: Payer: Self-pay | Admitting: Hematology and Oncology

## 2018-10-22 ENCOUNTER — Inpatient Hospital Stay: Payer: PPO | Attending: Gynecologic Oncology | Admitting: Hematology and Oncology

## 2018-10-22 ENCOUNTER — Encounter: Payer: Self-pay | Admitting: Oncology

## 2018-10-22 ENCOUNTER — Inpatient Hospital Stay: Payer: PPO

## 2018-10-22 ENCOUNTER — Other Ambulatory Visit: Payer: Self-pay

## 2018-10-22 VITALS — BP 131/80 | HR 87 | Temp 98.7°F | Resp 18 | Ht 64.0 in | Wt 189.8 lb

## 2018-10-22 DIAGNOSIS — Z808 Family history of malignant neoplasm of other organs or systems: Secondary | ICD-10-CM | POA: Diagnosis not present

## 2018-10-22 DIAGNOSIS — R300 Dysuria: Secondary | ICD-10-CM

## 2018-10-22 DIAGNOSIS — Z90722 Acquired absence of ovaries, bilateral: Secondary | ICD-10-CM

## 2018-10-22 DIAGNOSIS — I700 Atherosclerosis of aorta: Secondary | ICD-10-CM | POA: Diagnosis not present

## 2018-10-22 DIAGNOSIS — Z90710 Acquired absence of both cervix and uterus: Secondary | ICD-10-CM

## 2018-10-22 DIAGNOSIS — K5900 Constipation, unspecified: Secondary | ICD-10-CM | POA: Insufficient documentation

## 2018-10-22 DIAGNOSIS — Z803 Family history of malignant neoplasm of breast: Secondary | ICD-10-CM | POA: Diagnosis not present

## 2018-10-22 DIAGNOSIS — Z9889 Other specified postprocedural states: Secondary | ICD-10-CM | POA: Diagnosis not present

## 2018-10-22 DIAGNOSIS — Z791 Long term (current) use of non-steroidal anti-inflammatories (NSAID): Secondary | ICD-10-CM | POA: Diagnosis not present

## 2018-10-22 DIAGNOSIS — Z6832 Body mass index (BMI) 32.0-32.9, adult: Secondary | ICD-10-CM | POA: Diagnosis not present

## 2018-10-22 DIAGNOSIS — C541 Malignant neoplasm of endometrium: Secondary | ICD-10-CM | POA: Diagnosis not present

## 2018-10-22 DIAGNOSIS — E669 Obesity, unspecified: Secondary | ICD-10-CM | POA: Insufficient documentation

## 2018-10-22 DIAGNOSIS — Z79899 Other long term (current) drug therapy: Secondary | ICD-10-CM | POA: Diagnosis not present

## 2018-10-22 DIAGNOSIS — Z801 Family history of malignant neoplasm of trachea, bronchus and lung: Secondary | ICD-10-CM | POA: Insufficient documentation

## 2018-10-22 DIAGNOSIS — M1711 Unilateral primary osteoarthritis, right knee: Secondary | ICD-10-CM | POA: Insufficient documentation

## 2018-10-22 LAB — URINALYSIS, COMPLETE (UACMP) WITH MICROSCOPIC
Bilirubin Urine: NEGATIVE
Glucose, UA: NEGATIVE mg/dL
Ketones, ur: NEGATIVE mg/dL
Nitrite: NEGATIVE
Protein, ur: NEGATIVE mg/dL
Specific Gravity, Urine: 1.004 — ABNORMAL LOW (ref 1.005–1.030)
pH: 6 (ref 5.0–8.0)

## 2018-10-22 MED ORDER — PROCHLORPERAZINE MALEATE 10 MG PO TABS: 10.0000 mg | ORAL_TABLET | Freq: Four times a day (QID) | ORAL | 1 refills | Status: DC | PRN

## 2018-10-22 MED ORDER — DEXAMETHASONE 4 MG PO TABS: ORAL_TABLET | ORAL | 9 refills | Status: DC

## 2018-10-22 MED ORDER — ONDANSETRON HCL 8 MG PO TABS: 8.0000 mg | ORAL_TABLET | Freq: Three times a day (TID) | ORAL | 1 refills | Status: DC | PRN

## 2018-10-22 MED ORDER — LIDOCAINE-PRILOCAINE 2.5-2.5 % EX CREA: TOPICAL_CREAM | CUTANEOUS | 3 refills | Status: DC

## 2018-10-22 MED FILL — DEXAMETHASONE 4 MG TABLET: 4 | 84 days supply | Qty: 16 | Fill #0

## 2018-10-22 MED FILL — ONDANSETRON HCL 8 MG TABLET: 8 | 10 days supply | Qty: 30 | Fill #0

## 2018-10-22 MED FILL — PROCHLORPERAZINE 10 MG TAB: 10 | 8 days supply | Qty: 30 | Fill #0

## 2018-10-22 MED FILL — LIDOCAINE-PRILOCAINE CREAM: 2.5-2.5 | 30 days supply | Qty: 30 | Fill #0

## 2018-10-22 NOTE — Progress Notes (Signed)
START ON PATHWAY REGIMEN - Uterine     A cycle is every 21 days:     Paclitaxel      Carboplatin   **Always confirm dose/schedule in your pharmacy ordering system**  Patient Characteristics: Endometrioid Histology, Newly Diagnosed, Resected, Stage IIIC1/IIIC2 - Grade 1, 2, or 3 Histology: Endometrioid Histology Therapeutic Status: Newly Diagnosed AJCC T Category: T1 AJCC N Category: N1 AJCC M Category: M0 AJCC 8 Stage Grouping: IIIC1 Surgical Status: Resected Intent of Therapy: Curative Intent, Discussed with Patient

## 2018-10-23 ENCOUNTER — Encounter: Payer: Self-pay | Admitting: Radiation Oncology

## 2018-10-23 ENCOUNTER — Ambulatory Visit
Admission: RE | Admit: 2018-10-23 | Discharge: 2018-10-23 | Disposition: A | Discharge status: Home / Self Care | Payer: PPO | Source: Ambulatory Visit | Attending: Radiation Oncology | Admitting: Radiation Oncology

## 2018-10-23 ENCOUNTER — Other Ambulatory Visit: Payer: Self-pay | Admitting: Hematology and Oncology

## 2018-10-23 ENCOUNTER — Telehealth: Payer: Self-pay | Admitting: Oncology

## 2018-10-23 ENCOUNTER — Encounter: Payer: Self-pay | Admitting: Oncology

## 2018-10-23 ENCOUNTER — Other Ambulatory Visit: Payer: Self-pay

## 2018-10-23 VITALS — BP 141/82 | HR 100 | Temp 98.5°F | Resp 18 | Ht 65.0 in | Wt 186.7 lb

## 2018-10-23 DIAGNOSIS — R011 Cardiac murmur, unspecified: Secondary | ICD-10-CM | POA: Diagnosis not present

## 2018-10-23 DIAGNOSIS — Z90711 Acquired absence of uterus with remaining cervical stump: Secondary | ICD-10-CM | POA: Diagnosis not present

## 2018-10-23 DIAGNOSIS — R197 Diarrhea, unspecified: Secondary | ICD-10-CM | POA: Diagnosis not present

## 2018-10-23 DIAGNOSIS — K449 Diaphragmatic hernia without obstruction or gangrene: Secondary | ICD-10-CM | POA: Diagnosis not present

## 2018-10-23 DIAGNOSIS — Z808 Family history of malignant neoplasm of other organs or systems: Secondary | ICD-10-CM | POA: Diagnosis not present

## 2018-10-23 DIAGNOSIS — Z803 Family history of malignant neoplasm of breast: Secondary | ICD-10-CM | POA: Insufficient documentation

## 2018-10-23 DIAGNOSIS — I700 Atherosclerosis of aorta: Secondary | ICD-10-CM | POA: Insufficient documentation

## 2018-10-23 DIAGNOSIS — C541 Malignant neoplasm of endometrium: Secondary | ICD-10-CM | POA: Insufficient documentation

## 2018-10-23 DIAGNOSIS — R3915 Urgency of urination: Secondary | ICD-10-CM | POA: Insufficient documentation

## 2018-10-23 DIAGNOSIS — R300 Dysuria: Secondary | ICD-10-CM

## 2018-10-23 DIAGNOSIS — K5900 Constipation, unspecified: Secondary | ICD-10-CM | POA: Diagnosis not present

## 2018-10-23 DIAGNOSIS — F329 Major depressive disorder, single episode, unspecified: Secondary | ICD-10-CM | POA: Diagnosis not present

## 2018-10-23 DIAGNOSIS — Z79899 Other long term (current) drug therapy: Secondary | ICD-10-CM | POA: Diagnosis not present

## 2018-10-23 DIAGNOSIS — G4700 Insomnia, unspecified: Secondary | ICD-10-CM | POA: Diagnosis not present

## 2018-10-23 LAB — URINE CULTURE: Culture: <10000 — AB

## 2018-10-23 MED ORDER — NITROFURANTOIN MONOHYD MACRO 100 MG PO CAPS: 100.0000 mg | ORAL_CAPSULE | Freq: Two times a day (BID) | ORAL | 0 refills | Status: DC

## 2018-10-23 MED FILL — NITROFURANTOIN MONO-MCR 100: 100 | 5 days supply | Qty: 10 | Fill #0

## 2018-10-23 NOTE — Patient Instructions (Signed)
Coronavirus (COVID-19) Are you at risk?  Are you at risk for the Coronavirus (COVID-19)?  To be considered HIGH RISK for Coronavirus (COVID-19), you have to meet the following criteria:  . Traveled to [place], [place], [place], [place] or [place]; or in the [place] to [place], [place], [place], or [place]; and have fever, cough, and shortness of breath within the last 2 weeks of travel OR . Been in close contact with a person diagnosed with COVID-19 within the last 2 weeks and have fever, cough, and shortness of breath . IF YOU DO NOT MEET THESE CRITERIA, YOU ARE CONSIDERED LOW RISK FOR COVID-19.  What to do if you are HIGH RISK for COVID-19?  . If you are having a medical emergency, call 911. . Seek medical care right away. Before you go to a doctor's office, urgent care or emergency department, call ahead and tell them about your recent travel, contact with someone diagnosed with COVID-19, and your symptoms. You should receive instructions from your physician's office regarding next steps of care.  . When you arrive at healthcare provider, tell the healthcare staff immediately you have returned from visiting [place], [place], [place], [place] or [place]; or traveled in the [place] to [place], [place], [place], or [place]; in the last two weeks or you have been in close contact with a person diagnosed with COVID-19 in the last 2 weeks.   . Tell the health care staff about your symptoms: fever, cough and shortness of breath. . After you have been seen by a medical provider, you will be either: o Tested for (COVID-19) and discharged home on quarantine except to seek medical care if symptoms worsen, and asked to  - Stay home and avoid contact with others until you get your results (4-5 days)  - Avoid travel on public transportation if possible (such as bus, train, or airplane) or o Sent to the Emergency Department by EMS for evaluation, COVID-19 testing, and possible  admission depending on your condition and test results.  What to do if you are LOW RISK for COVID-19?  Reduce your risk of any infection by using the same precautions used for avoiding the common cold or flu:  . Wash your hands often with soap and warm water for at least 20 seconds.  If soap and water are not readily available, use an alcohol-based hand sanitizer with at least 60% alcohol.  . If coughing or sneezing, cover your mouth and nose by coughing or sneezing into the elbow areas of your shirt or coat, into a tissue or into your sleeve (not your hands). . Avoid shaking hands with others and consider head nods or verbal greetings only. . Avoid touching your eyes, nose, or mouth with unwashed hands.  . Avoid close contact with people who are sick. . Avoid places or events with large numbers of people in one location, like concerts or sporting events. . Carefully consider travel plans you have or are making. . If you are planning any travel outside or inside the [place], visit the CDC's Travelers' Health webpage for the latest health notices. . If you have some symptoms but not all symptoms, continue to monitor at home and seek medical attention if your symptoms worsen. . If you are having a medical emergency, call 911.   ADDITIONAL HEALTHCARE OPTIONS FOR PATIENTS  [facility] / e-Visit: [email or web address]         [facility]: [phone]  [facility]: [phone]                   [facility]: [phone]   

## 2018-10-23 NOTE — Assessment & Plan Note (Signed)
We discussed the role of treatment. I recommend chemo education class, port placement and blood work I will consult with genetic counselor to see if she will qualify for genetic testing.  We reviewed the NCCN guidelines We discussed the role of chemotherapy. The intent is of curative intent.  We discussed some of the risks, benefits, side-effects of carboplatin & Taxol. Treatment is intravenous, every 3 weeks x 6 cycles  Some of the short term side-effects included, though not limited to, including weight loss, life threatening infections, risk of allergic reactions, need for transfusions of blood products, nausea, vomiting, change in bowel habits, loss of hair, admission to hospital for various reasons, and risks of death.   Long term side-effects are also discussed including risks of infertility, permanent damage to nerve function, hearing loss, chronic fatigue, kidney damage with possibility needing hemodialysis, and rare secondary malignancy including bone marrow disorders.  The patient is aware that the response rates discussed earlier is not guaranteed.  After a long discussion, patient made an informed decision to proceed with the prescribed plan of care.   Patient education material was dispensed. We discussed premedication with dexamethasone before chemotherapy. I do not plan prophylactic G-CSF support. I will see her prior to cycle 2 of therapy

## 2018-10-23 NOTE — Telephone Encounter (Signed)
Left a message for patient advising her that a prescription for Macrobid has been sent to the [facility].  Requested a return call to confirm.

## 2018-10-23 NOTE — Progress Notes (Signed)
B and E NOTE  Patient Care Team: Zoe Agreste, MD as PCP - General (Family Medicine) Zoe Cantor, MD as Consulting Physician (Ophthalmology)  ASSESSMENT & PLAN:  Endometrial cancer [facility]) We discussed the role of treatment. I recommend chemo education class, port placement and blood work I will consult with genetic counselor to see if she will qualify for genetic testing.  We reviewed the NCCN guidelines We discussed the role of chemotherapy. The intent is of curative intent.  We discussed some of the risks, benefits, side-effects of carboplatin & Taxol. Treatment is intravenous, every 3 weeks x 6 cycles  Some of the short term side-effects included, though not limited to, including weight loss, life threatening infections, risk of allergic reactions, need for transfusions of blood products, nausea, vomiting, change in bowel habits, loss of hair, admission to hospital for various reasons, and risks of death.   Long term side-effects are also discussed including risks of infertility, permanent damage to nerve function, hearing loss, chronic fatigue, kidney damage with possibility needing hemodialysis, and rare secondary malignancy including bone marrow disorders.  The patient is aware that the response rates discussed earlier is not guaranteed.  After a long discussion, patient made an informed decision to proceed with the prescribed plan of care.   Patient education material was dispensed. We discussed premedication with dexamethasone before chemotherapy. I do not plan prophylactic G-CSF support. I will see her prior to cycle 2 of therapy  Dysuria She has symptoms of dysuria Urinalysis is abnormal although urine culture is nonspecific I recommend short course antibiotic treatment   Orders Placed This Encounter  Procedures  . Urine Culture    Standing Status:   Future    Number of Occurrences:   1    Standing Expiration Date:   11/26/2019  . IR  IMAGING GUIDED PORT INSERTION    Standing Status:   Future    Standing Expiration Date:   12/22/2019    Order Specific Question:   Reason for Exam (SYMPTOM  OR DIAGNOSIS REQUIRED)    Answer:   need port for chemo to start on 6/8    Order Specific Question:   Preferred Imaging Location?    Answer:   [facility]  . Urinalysis, Complete w Microscopic    Standing Status:   Future    Number of Occurrences:   1    Standing Expiration Date:   10/22/2019  . CBC with Differential (Cancer Center Only)    Standing Status:   Standing    Number of Occurrences:   20    Standing Expiration Date:   10/22/2019  . CMP ([facility] only)    Standing Status:   Standing    Number of Occurrences:   20    Standing Expiration Date:   10/22/2019     CHIEF COMPLAINTS/PURPOSE OF CONSULTATION:  Uterine cancer, for adjuvant treatment  HISTORY OF PRESENTING ILLNESS:  Zoe Kim 73 y.o. female is here because of recent diagnosis of uterine cancer. The patient had remote history of abnormal Pap smear She had postmenopausal bleeding leading to further evaluation and subsequent surgery. I have reviewed her chart and materials related to her cancer extensively and collaborated history with the patient. 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  The patient reports 6 months or maybe longer of intermittent vaginal  spotting.  She was seen by Dr. [name] on September 19, 2018 who performed a Pap smear which revealed atypical glandular cells and an endocervical biopsy which revealed a benign endocervical polyp, and endometrial biopsy which revealed high-grade serous endometrial adenocarcinoma and adenocarcinoma FIGO grade 2.     10/03/2018 Imaging    CT abdomen and pelvis 1. Enhancing soft tissue density in endometrial cavity, consistent with history of endometrial carcinoma. No evidence of extra uterine tumor extension or metastatic disease. 2. Tiny less than 1 cm anterior uterine fibroid. 3. Small hiatal hernia.  Aortic Atherosclerosis (ICD10-I70.0).    10/14/2018 Pathology Results    1. Lymph node, sentinel, biopsy, right external iliac - METASTATIC CARCINOMA PRESENT IN (1) OF (1) LYMPH NODE. 2. Lymph node, sentinel, biopsy, right deep obturator - ONE LYMPH NODE, NEGATIVE FOR CARCINOMA (0/1). 3. Lymph node, sentinel, biopsy, left external iliac - ONE LYMPH NODE, NEGATIVE FOR CARCINOMA (0/1). 4. Uterus +/- tubes/ovaries, neoplastic, cervix, bilateral fallopian tubes and ovaries - MIXED CELL CARCINOMA OF ENDOMETRIUM (MIXED ENDOMETRIOID CARCINOMA AND SEROUS CARCINOMA), WITH INVASION LESS THAN HALF OF THE MYOMETRIUM. - NO INVOLVEMENT OF UTERINE SEROSA, CERVICAL STROMA OR ADNEXA. - SEE ONCOLOGY TABLE. Microscopic Comment 4. UTERUS, CARCINOMA OR CARCINOSARCOMA Procedure: Total hysterectomy and bilateral salpingo-oophorectomy Histologic type: Mixed cell carcinoma (Endometrioid carcinoma 70% and Serous carcinoma 30%) Histologic Grade: Not applicable Myometrial Invasion: Present Depth of invasion: 6 mm Myometrial thickness: 17 mm Uterine Serosa Involvement: Not identified Cervical Stromal Involvement: Not identified Extent of Involvement of Other Organs: Not involved Lymphovascular Invasion: Present Regional Lymph Nodes: Examined: 3 Sentinel 0 Non-sentinel 3 Total Lymph nodes with metastases:  1 Isolated tumor cells (< 0.2 mm): 0 Micrometastasis (> 0.2 mm and < 2.0 mm): 0 Macrometastasis (> 2.0 mm): 1 Extracapsular extension: No definite ECE MMR/MSI Testing: Will be performed and the results reported separately Pathologic Stage Classification (pTNM, AJCC 8th edition): pT1a, pN1a FIGO Stage: III-C1 Representative Tumor Block: 1-A Comment: The size of the metastatic deposit in the right external iliac sentinel lymph node is 32 mm. The metastatic deposit is virtually all serous carcinoma. Intradepartmental consultation was obtained    10/14/2018 Surgery    Pre-operative Diagnosis: endometrial cancer grade 3 (serous), obesity  Operation: Robotic-assisted laparoscopic total hysterectomy with bilateral salpingoophorectomy, SLN,  biopsy (22 modifier for obesity, BMI 32).  Surgeon: Donaciano Eva  Operative Findings:  : 8cm arcuate uterus, normal appearing tubes and ovaries. Obesity with BMI 32. Narrow vaginal introitus.      10/17/2018 Cancer Staging    Staging form: Corpus Uteri - Carcinoma and Carcinosarcoma, AJCC 8th Edition - Pathologic: FIGO Stage IIIC1 (pT1, pN1a, cM0) - Signed by Heath Lark, MD on 10/17/2018     Genetic Testing    Patient has genetic testing done for MMR. Results revealed patient has the following mutation(s): MMR: Normal    She is recovering well from surgery except for mild tenderness along her incision wound. Her appetite is stable She denies significant weight loss She had some mild constipation after surgery but that is resolving She complained of mild dysuria  but no hematuria  MEDICAL HISTORY:  Past Medical History:  Diagnosis Date  . Anxiety   . Arthritis   . Depression   . Endometrial ca ([place])   . Heart murmur    "years ago"  . Insomnia   . Pneumonia    "long time ago"  . Primary localized osteoarthrosis of the knee, right   . Urinary urgency     SURGICAL HISTORY: Past Surgical History:  Procedure Laterality Date  .  COLONOSCOPY    . EYE SURGERY Bilateral    cataract with lens  . ROBOTIC ASSISTED TOTAL HYSTERECTOMY WITH BILATERAL SALPINGO OOPHERECTOMY N/A 10/14/2018   Procedure: ROBOTIC ASSISTED TOTAL HYSTERECTOMY WITH BILATERAL SALPINGO OOPHORECTOMY;  Surgeon: Everitt Amber, MD;  Location: [facility];  Service: Gynecology;  Laterality: N/A;  . SENTINEL NODE BIOPSY N/A 10/14/2018   Procedure: SENTINEL LYMPH NODE BIOPSY;  Surgeon: Everitt Amber, MD;  Location: [facility];  Service: Gynecology;  Laterality: N/A;  . TONSILLECTOMY    . TOOTH EXTRACTION Right 08/27/2017  . TOTAL KNEE ARTHROPLASTY Right 07/30/2016   Procedure: TOTAL KNEE ARTHROPLASTY;  Surgeon: Elsie Saas, MD;  Location: [facility];  Service: Orthopedics;  Laterality: Right;  . VEIN LIGATION AND STRIPPING      SOCIAL HISTORY: Social History   Socioeconomic History  . Marital status: Married    Spouse name: Not on file  . Number of children: 0  . Years of education: Not on file  . Highest education level: Bachelor's degree (e.g., BA, AB, BS)  Occupational History  . Occupation: retired Tour manager  . Financial resource strain: Not hard at all  . Food insecurity:    Worry: Never true    Inability: Never true  . Transportation needs:    Medical: No    Non-medical: No  Tobacco Use  . Smoking status: Never Smoker  . Smokeless tobacco: Never Used  . Tobacco comment: smoked 2 weeks in college  Substance and Sexual Activity  . Alcohol use: No  . Drug use: No  . Sexual activity: Not Currently  Lifestyle  . Physical activity:    Days per week: 0 days    Minutes per session: 0 min  . Stress: Very much  Relationships  . Social connections:    Talks on phone: Never    Gets together: Never    Attends religious service: Never    Active member of club or organization: No    Attends meetings of clubs or organizations: Never    Relationship status: Married  . Intimate partner violence:    Fear of current or ex partner: No    Emotionally  abused: No    Physically abused: No    Forced sexual activity: No  Other Topics Concern  . Not on file  Social History Narrative  . Not on file    FAMILY HISTORY: Family History  Problem Relation Age of Onset  . Heart disease Mother   . COPD Mother   . Cancer Mother        uterine  . Breast cancer Mother        diagnosed in 45s  . Heart disease Father   . COPD Brother   . Heart disease Brother   . Cancer Brother        lung ca, smoker  . Cancer Maternal Grandmother        breast  . Stroke Maternal Grandfather     ALLERGIES:  is allergic to sulfa antibiotics.  MEDICATIONS:  Current Outpatient Medications  Medication Sig Dispense Refill  . acetaminophen (TYLENOL) 500 MG tablet Take 1,000 mg by mouth every 6 (six) hours as needed for moderate pain.    Marland Kitchen dexamethasone (DECADRON) 4 MG tablet Take 2 tabs at the night before and 2 tab the morning of chemotherapy, every 3 weeks, by mouth 24 tablet 9  . ibuprofen (ADVIL) 600 MG tablet Take 1 tablet (600 mg total) by mouth every 6 (six) hours as needed for moderate pain. For AFTER surgery 30 tablet 1  . lidocaine-prilocaine (EMLA) cream Apply to affected area once 30 g 3  . MELATONIN PO Take 1 tablet by mouth at bedtime as needed (sleep).    . nitrofurantoin, macrocrystal-monohydrate, (MACROBID) 100 MG capsule Take 1 capsule (100 mg total) by mouth 2 (two) times daily. 10 capsule 0  . ondansetron (ZOFRAN) 8 MG tablet Take 1 tablet (8 mg total) by mouth every 8 (eight) hours as needed. Start on the third day after chemotherapy. 30 tablet 1  . oxybutynin (DITROPAN) 5 MG tablet Take 0.5-1 tablets (2.5-5 mg total) by mouth every 8 (eight) hours as needed for bladder spasms. (Patient taking differently: Take 5 mg by mouth at bedtime. ) 270 tablet 1  . oxyCODONE (OXY IR/ROXICODONE) 5 MG immediate release tablet Take 1 tablet (5 mg total) by mouth every 4 (four) hours as needed for severe pain. For AFTER surgery, do not take and drive 10  tablet 0  . prochlorperazine (COMPAZINE) 10 MG tablet Take 1 tablet (10 mg total) by mouth every 6 (six) hours as needed (Nausea or vomiting). 30 tablet 1   No current facility-administered medications for this visit.     REVIEW OF SYSTEMS:   Constitutional: Denies fevers, chills or abnormal night sweats Eyes: Denies blurriness of vision, double vision or watery eyes Ears, nose, mouth, throat, and face: Denies mucositis or sore throat Respiratory: Denies cough, dyspnea or wheezes Cardiovascular: Denies palpitation, chest discomfort or lower extremity swelling Gastrointestinal:  Denies nausea, heartburn or change in bowel habits Skin: Denies abnormal skin rashes Lymphatics: Denies new lymphadenopathy or easy bruising Neurological:Denies numbness, tingling or new weaknesses Behavioral/Psych: Mood is stable, no new changes  All other systems were reviewed with the patient and are negative.  PHYSICAL EXAMINATION: ECOG PERFORMANCE STATUS: 1 - Symptomatic but completely ambulatory  Vitals:   10/22/18 1039  BP: 131/80  Pulse: 87  Resp: 18  Temp: 98.7 F (37.1 C)  SpO2: 99%   Filed Weights   10/22/18 1039  Weight: 189 lb 12.8 oz (86.1 kg)    GENERAL:alert, no distress and comfortable SKIN: skin color, texture, turgor are normal, no rashes or significant lesions EYES: normal, conjunctiva are pink and non-injected, sclera clear OROPHARYNX:no exudate, no erythema and lips, buccal mucosa, and tongue normal  NECK: supple, thyroid normal size, non-tender, without nodularity LYMPH:  no palpable lymphadenopathy in the cervical, axillary or inguinal LUNGS: clear to auscultation and percussion with normal breathing effort HEART: regular rate & rhythm and no murmurs and no lower extremity edema ABDOMEN:abdomen soft, non-tender and normal bowel sounds.  Noted well-healed surgical scar Musculoskeletal:no cyanosis of digits and no clubbing  PSYCH: alert & oriented x 3 with fluent  speech NEURO: no focal motor/sensory deficits  LABORATORY DATA:  I have reviewed the data as listed Lab Results  Component Value Date   WBC 7.2 10/09/2018   HGB 13.6 10/09/2018   HCT 41.4 10/09/2018   MCV 89.4 10/09/2018   PLT 284 10/09/2018  Recent Labs    10/01/18 1317  NA 140  K 4.4  CL 106  CO2 27  GLUCOSE 93  BUN 12  CREATININE 0.84  CALCIUM 9.2  GFRNONAA >60  GFRAA >60  PROT 7.4  ALBUMIN 4.2  AST 22  ALT 18  ALKPHOS 148*  BILITOT 0.5    RADIOGRAPHIC STUDIES: I have reviewed imaging studies I have personally reviewed the radiological images as listed and agreed with the findings in the report. Ct Abdomen Pelvis W Contrast  Result Date: 10/03/2018 CLINICAL DATA:  Newly diagnosed endometrial carcinoma. Staging. EXAM: CT ABDOMEN AND PELVIS WITH CONTRAST TECHNIQUE: Multidetector CT imaging of the abdomen and pelvis was performed using the standard protocol following bolus administration of intravenous contrast. CONTRAST:  14m OMNIPAQUE IOHEXOL 300 MG/ML  SOLN COMPARISON:  None. FINDINGS: Lower Chest: No acute findings. Hepatobiliary: No hepatic masses identified. Gallbladder is unremarkable. Pancreas:  No mass or inflammatory changes. Spleen: Within normal limits in size and appearance. Adrenals/Urinary Tract: No masses identified. No evidence of hydronephrosis. Stomach/Bowel: Small hiatal hernia noted. No evidence of obstruction, inflammatory process or abnormal fluid collections. Normal appendix visualized. Vascular/Lymphatic: No pathologically enlarged lymph nodes. No abdominal aortic aneurysm. Aortic atherosclerosis. Reproductive: Enhancing soft tissue density is seen in the endometrial cavity, consistent with history of endometrial carcinoma. No evidence of extra uterine tumor extension. Tiny less than 1 cm subserosal fibroid seen in the anterior uterine corpus. Adnexal regions are unremarkable. Other:  None. Musculoskeletal:  No suspicious bone lesions identified.  IMPRESSION: 1. Enhancing soft tissue density in endometrial cavity, consistent with history of endometrial carcinoma. No evidence of extra uterine tumor extension or metastatic disease. 2. Tiny less than 1 cm anterior uterine fibroid. 3. Small hiatal hernia. Aortic Atherosclerosis (ICD10-I70.0). Electronically Signed   By: JEarle GellM.D.   On: 10/03/2018 12:10    I spent 60 minutes counseling the patient face to face. The total time spent in the appointment was 80 minutes and more than 50% was on counseling.  All questions were answered. The patient knows to call the clinic with any problems, questions or concerns.  NHeath Lark MD 10/23/2018 1:56 PM

## 2018-10-23 NOTE — Progress Notes (Signed)
Radiation Oncology         [phone] ________________________________  Initial Outpatient Consultation  Name: Zoe Kim MRN: [id number]  Date: 10/23/2018  DOB: 08/04/1945  CC:Greene, Jeffrey R, MD  Greene, Jeffrey R, MD   REFERRING PHYSICIAN: Greene, Jeffrey R, MD  DIAGNOSIS: The encounter diagnosis was Endometrial cancer (HCC).   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 female who is seen in consultation today for recently diagnosed endometrial cancer. She presented to Dr. Lavoie on 09/19/2018 with six-month history of intermittent vaginal spotting. A Pap smear was obtained at that time which revealed atypical glandular cells. Endocervical biopsy revealed a benign endocervical polyp. Endometrial biopsy revealed high-grade serous endometrial adenocarcinoma and adenocarcinoma FIGO grade 2.  She underwent a pre-operative CT scan of the abdomen/pelvis on 10/03/2018 which showed an enhancing soft tissue density in the endometrial cavity, consistent with known endometrial carcinoma. No evidence of extra uterine tumor extension or metastatic disease. Also noted was a tiny less than 1 cm anterior uterine fibroid.  She subsequently underwent robotic assisted total hysterectomy with bilateral salpingo oophorectomy and sentinel lymph node biopsy on 10/14/2018. Final pathology revealed mixed cell carcinoma of endometrium (mixed endometroid carcinoma 70% and serous carcinoma 30%), with invasion to less than half of the myometrium; depth of invasion: 6 mm, myometrial thickness: 17 mm. Lymphovascular  invasion was present. No involvement of uterine serosa, cervical stroma, or adnexa. Metastatic carcinoma was present in 1 right external iliac lymph node. The size of the metastatic deposit in the right external iliac sentinel lymph node was 32 mm. The metastatic deposit was virtually all serous carcinoma. FIGO Stage: III-C1, Pathologic stage: pT1a, pN1a. By immunohistochemistry, the tumor cells are negative for Her2 (0).  The patient reviewed pathology results with Dr. Rossi and has kindly been referred today for discussion of adjuvant radiation treatment. The patient is also being planned to receive adjuvant chemotherapy consisting of Paclitaxel and Carboplatin.  On review of systems, the patient reports dysuria and has a prescription for Macrobid that she will pick up today. She states that her bowel movements have alternated between constipation and diarrhea since surgery.  PREVIOUS RADIATION THERAPY: No  PAST MEDICAL HISTORY:  has a past medical history of Anxiety, Arthritis, Depression, Endometrial ca (HCC), Heart murmur, Insomnia, Pneumonia, Primary localized osteoarthrosis of the knee, right, and Urinary urgency.    PAST SURGICAL HISTORY: Past Surgical History:  Procedure Laterality Date  . COLONOSCOPY    . EYE SURGERY Bilateral    cataract with lens  . ROBOTIC ASSISTED TOTAL HYSTERECTOMY WITH BILATERAL SALPINGO OOPHERECTOMY N/A 10/14/2018   Procedure: ROBOTIC ASSISTED TOTAL HYSTERECTOMY WITH BILATERAL SALPINGO OOPHORECTOMY;  Surgeon: Rossi, Emma, MD;  Location: [facility];  Service: Gynecology;  Laterality: N/A;  . SENTINEL NODE BIOPSY N/A 10/14/2018   Procedure: SENTINEL LYMPH NODE BIOPSY;  Surgeon: Rossi, Emma, MD;  Location: [facility];  Service: Gynecology;  Laterality: N/A;  . TONSILLECTOMY    . TOOTH EXTRACTION Right 08/27/2017  . TOTAL KNEE ARTHROPLASTY Right 07/30/2016   Procedure: TOTAL KNEE ARTHROPLASTY;  Surgeon: Robert Wainer, MD;  Location: [facility];  Service: Orthopedics;   Laterality: Right;  .   VEIN LIGATION AND STRIPPING      FAMILY HISTORY: family history includes Breast cancer in her mother; COPD in her brother and mother; Cancer in her brother, maternal grandmother, and mother; Heart disease in her brother, father, and mother; Stroke in her maternal grandfather.  SOCIAL HISTORY:  reports that she has never smoked. She has never used smokeless tobacco. She reports that she does not drink alcohol or use drugs.  ALLERGIES: Sulfa antibiotics  MEDICATIONS:  Current Outpatient Medications  Medication Sig Dispense Refill  . acetaminophen (TYLENOL) 500 MG tablet Take 1,000 mg by mouth every 6 (six) hours as needed for moderate pain.    . ibuprofen (ADVIL) 600 MG tablet Take 1 tablet (600 mg total) by mouth every 6 (six) hours as needed for moderate pain. For AFTER surgery 30 tablet 1  . MELATONIN PO Take 1 tablet by mouth at bedtime as needed (sleep).    . nitrofurantoin, macrocrystal-monohydrate, (MACROBID) 100 MG capsule Take 1 capsule (100 mg total) by mouth 2 (two) times daily. 10 capsule 0  . oxybutynin (DITROPAN) 5 MG tablet Take 0.5-1 tablets (2.5-5 mg total) by mouth every 8 (eight) hours as needed for bladder spasms. (Patient taking differently: Take 5 mg by mouth at bedtime. ) 270 tablet 1  . dexamethasone (DECADRON) 4 MG tablet Take 2 tabs at the night before and 2 tab the morning of chemotherapy, every 3 weeks, by mouth (Patient not taking: Reported on 10/23/2018) 24 tablet 9  . lidocaine-prilocaine (EMLA) cream Apply to affected area once (Patient not taking: Reported on 10/23/2018) 30 g 3  . ondansetron (ZOFRAN) 8 MG tablet Take 1 tablet (8 mg total) by mouth every 8 (eight) hours as needed. Start on the third day after chemotherapy. (Patient not taking: Reported on 10/23/2018) 30 tablet 1  . oxyCODONE (OXY IR/ROXICODONE) 5 MG immediate release tablet Take 1 tablet (5 mg total) by mouth every 4 (four) hours as needed for severe pain. For AFTER surgery, do  not take and drive (Patient not taking: Reported on 10/23/2018) 10 tablet 0  . prochlorperazine (COMPAZINE) 10 MG tablet Take 1 tablet (10 mg total) by mouth every 6 (six) hours as needed (Nausea or vomiting). (Patient not taking: Reported on 10/23/2018) 30 tablet 1   No current facility-administered medications for this encounter.     REVIEW OF SYSTEMS:  REVIEW OF SYSTEMS: A 10+ POINT REVIEW OF SYSTEMS WAS OBTAINED including neurology, dermatology, psychiatry, cardiac, respiratory, lymph, extremities, GI, GU, musculoskeletal, constitutional, reproductive, HEENT. All pertinent positives are noted in the HPI. All others are negative.   PHYSICAL EXAM:  height is 5' 5" (1.651 m) and weight is 186 lb 11.2 oz (84.7 kg). Her oral temperature is 98.5 F (36.9 C). Her blood pressure is 141/82 (abnormal) and her pulse is 100. Her respiration is 18 and oxygen saturation is 100%.   General: Alert and oriented, in no acute distress HEENT: Head is normocephalic. Extraocular movements are intact. Oropharynx is clear. Neck: Neck is supple, no palpable cervical or supraclavicular lymphadenopathy. Heart: Regular in rate and rhythm with no murmurs, rubs, or gallops. Chest: Clear to auscultation bilaterally, with no rhonchi, wheezes, or rales. Abdomen: Soft, nontender, nondistended, with no rigidity or guarding. Extremities: No cyanosis or edema. Lymphatics: see Neck Exam Skin: No concerning lesions. Musculoskeletal: symmetric strength and muscle tone throughout. Neurologic: Cranial nerves II through XII are grossly intact. No obvious focalities. Speech is fluent. Coordination is intact. Psychiatric: Judgment and insight are intact. Affect is appropriate.    Pelvic Exam deferred until simulation and planning day.   ECOG = 1  0 - Asymptomatic (Fully active, able to carry on all predisease activities without restriction)  1 - Symptomatic but completely ambulatory (Restricted in physically strenuous activity but  ambulatory and able to carry out work of a light or sedentary nature. For example, light housework, office work)  2 - Symptomatic, <50% in bed during the day (Ambulatory and capable of all self care but unable to carry out any work activities. Up and about more than 50% of waking hours)  3 - Symptomatic, >50% in bed, but not bedbound (Capable of only limited self-care, confined to bed or chair 50% or more of waking hours)  4 - Bedbound (Completely disabled. Cannot carry on any self-care. Totally confined to bed or chair)  5 - Death   Oken MM, Creech RH, Tormey DC, et al. (1982). "Toxicity and response criteria of the Eastern Cooperative Oncology Group". Am. J. Clin. Oncol. 5 (6): 649-55  LABORATORY DATA:  Lab Results  Component Value Date   WBC 7.2 10/09/2018   HGB 13.6 10/09/2018   HCT 41.4 10/09/2018   MCV 89.4 10/09/2018   PLT 284 10/09/2018   NEUTROABS 3.6 07/19/2016   Lab Results  Component Value Date   NA 140 10/01/2018   K 4.4 10/01/2018   CL 106 10/01/2018   CO2 27 10/01/2018   GLUCOSE 93 10/01/2018   CREATININE 0.84 10/01/2018   CALCIUM 9.2 10/01/2018      RADIOGRAPHY: Ct Abdomen Pelvis W Contrast  Result Date: 10/03/2018 CLINICAL DATA:  Newly diagnosed endometrial carcinoma. Staging. EXAM: CT ABDOMEN AND PELVIS WITH CONTRAST TECHNIQUE: Multidetector CT imaging of the abdomen and pelvis was performed using the standard protocol following bolus administration of intravenous contrast. CONTRAST:  100mL OMNIPAQUE IOHEXOL 300 MG/ML  SOLN COMPARISON:  None. FINDINGS: Lower Chest: No acute findings. Hepatobiliary: No hepatic masses identified. Gallbladder is unremarkable. Pancreas:  No mass or inflammatory changes. Spleen: Within normal limits in size and appearance. Adrenals/Urinary Tract: No masses identified. No evidence of hydronephrosis. Stomach/Bowel: Small hiatal hernia noted. No evidence of obstruction, inflammatory process or abnormal fluid collections. Normal appendix  visualized. Vascular/Lymphatic: No pathologically enlarged lymph nodes. No abdominal aortic aneurysm. Aortic atherosclerosis. Reproductive: Enhancing soft tissue density is seen in the endometrial cavity, consistent with history of endometrial carcinoma. No evidence of extra uterine tumor extension. Tiny less than 1 cm subserosal fibroid seen in the anterior uterine corpus. Adnexal regions are unremarkable. Other:  None. Musculoskeletal:  No suspicious bone lesions identified. IMPRESSION: 1. Enhancing soft tissue density in endometrial cavity, consistent with history of endometrial carcinoma. No evidence of extra uterine tumor extension or metastatic disease. 2. Tiny less than 1 cm anterior uterine fibroid. 3. Small hiatal hernia. Aortic Atherosclerosis (ICD10-I70.0). Electronically Signed   By: John  Stahl M.D.   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 The patient would be at risk for vaginal cuff recurrence given her pathologic findings with serous histology and LVSI. I would agree with Dr. Rossi's recommendation for vaginal brachytherapy.   Today, I talked to the patient about the findings and work-up thus far.  We discussed the natural history of endometrial cancer and general treatment, highlighting the role of radiotherapy in the management.  We discussed the available radiation techniques, and focused on the details of logistics and   delivery.  We reviewed the anticipated acute and late sequelae associated with radiation in this setting.  The patient was encouraged to ask questions that I answered to the best of my ability.  A patient consent form was discussed and signed.  We retained a copy for our records.  The patient would like to proceed with radiation and will be scheduled for CT simulation.  Patient is interested in proceeding with  her radiation therapy during her adjuvant chemotherapy, and we will plan on initiating radiation therapy during the 2nd or 3rd cycle of chemotherapy.   PLAN: Vaginal brachytherapy with Iridium-192 as the high-dose-rate source. Anticipate 5 high-dose-rate treatments.       ------------------------------------------------   D. , PhD, MD  This document serves as a record of services personally performed by  , MD. It was created on his behalf by Haley Woodruff, a trained medical scribe. The creation of this record is based on the scribe's personal observations and the provider's statements to them. This document has been checked and approved by the attending provider.  

## 2018-10-23 NOTE — Progress Notes (Signed)
GYN Location of Tumor / Histology: stage IIIC serous endometrial cancer  Zoe Kim presented with symptoms of: The patient reports 6 months or maybe longer of intermittent vaginal spotting.  She was seen by Dr. [name] on September 19, 2018 who performed a Pap smear which revealed atypical glandular cells and an endocervical biopsy which revealed a benign endocervical polyp, and endometrial biopsy which revealed high-grade serous endometrial adenocarcinoma and adenocarcinoma FIGO grade 2.  Biopsies revealed: 10/14/18:  Diagnosis 1. Lymph node, sentinel, biopsy, right external iliac - METASTATIC CARCINOMA PRESENT IN (1) OF (1) LYMPH NODE. 2. Lymph node, sentinel, biopsy, right deep obturator - ONE LYMPH NODE, NEGATIVE FOR CARCINOMA (0/1). 3. Lymph node, sentinel, biopsy, left external iliac - ONE LYMPH NODE, NEGATIVE FOR CARCINOMA (0/1). 4. Uterus +/- tubes/ovaries, neoplastic, cervix, bilateral fallopian tubes and ovaries - MIXED CELL CARCINOMA OF ENDOMETRIUM (MIXED ENDOMETRIOID CARCINOMA AND SEROUS CARCINOMA), WITH INVASION LESS THAN HALF OF THE MYOMETRIUM. - NO INVOLVEMENT OF UTERINE SEROSA, CERVICAL STROMA OR ADNEXA.  Past/Anticipated interventions by Gyn/Onc surgery, if any: 10/14/18:  Operation: Robotic-assisted laparoscopic total hysterectomy with bilateral salpingoophorectomy, SLN,  biopsy (22 modifier for obesity, BMI 32).  Surgeon: Donaciano Eva  Past/Anticipated interventions by medical oncology, if any: Pt saw Dr. Alvy Bimler yesterday, 10/22/18  Weight changes, if any:  Wt Readings from Last 3 Encounters:  10/23/18 186 lb 11.2 oz (84.7 kg)  10/22/18 189 lb 12.8 oz (86.1 kg)  10/14/18 188 lb 8 oz (85.5 kg)     Bowel/Bladder complaints, if any: Pt reports dysuria and has a prescription for Macrobid that she will pick up after appointment. Pt reports she is alternating between constipation and diarrhea since surgery.  Nausea/Vomiting, if any: No  Pain issues, if any:   Pt denies c/o pain.  SAFETY ISSUES:  Prior radiation? No  Pacemaker/ICD? No  Possible current pregnancy? No  Is the patient on methotrexate? No  Current Complaints / other details:  Pt presents today for initial consult with Dr. Sondra Come. Pt is unaccompanied.  BP (!) 141/82 (BP Location: Left Arm, Patient Position: Sitting)   Pulse 100   Temp 98.5 F (36.9 C) (Oral)   Resp 18   Ht 5\' 5"  (1.651 m)   Wt 186 lb 11.2 oz (84.7 kg)   SpO2 100%   BMI 31.07 kg/m   Loma Sousa, RN BSN

## 2018-10-23 NOTE — Assessment & Plan Note (Signed)
She has symptoms of dysuria Urinalysis is abnormal although urine culture is nonspecific I recommend short course antibiotic treatment

## 2018-10-23 NOTE — Telephone Encounter (Signed)
Saw Zoe Kim in the Radiation waiting room and advised her of Macrobid prescription at [facility].  She verbalized agreement and understanding.

## 2018-10-28 ENCOUNTER — Telehealth: Payer: Self-pay | Admitting: Oncology

## 2018-10-28 NOTE — Telephone Encounter (Signed)
Harshini called back and was advised of new port appointment on 11/06/18 with arrival at 1 pm at [facility] (NPO after 8 am, needs a driver and no blood thinners). She verbalized agreement and understanding.

## 2018-10-28 NOTE — Telephone Encounter (Signed)
Left a message for [facility] with new appointment for port on 11/06/2018 at 1 pm.  Requested a return call to confirm.

## 2018-10-29 ENCOUNTER — Other Ambulatory Visit: Payer: Self-pay

## 2018-10-29 ENCOUNTER — Inpatient Hospital Stay (HOSPITAL_BASED_OUTPATIENT_CLINIC_OR_DEPARTMENT_OTHER): Payer: PPO | Admitting: Gynecologic Oncology

## 2018-10-29 ENCOUNTER — Inpatient Hospital Stay: Payer: PPO

## 2018-10-29 ENCOUNTER — Telehealth: Payer: Self-pay | Admitting: Hematology and Oncology

## 2018-10-29 ENCOUNTER — Encounter: Payer: Self-pay | Admitting: Gynecologic Oncology

## 2018-10-29 VITALS — BP 134/70 | HR 95 | Temp 99.0°F | Resp 18 | Ht 65.0 in | Wt 184.9 lb

## 2018-10-29 DIAGNOSIS — C541 Malignant neoplasm of endometrium: Secondary | ICD-10-CM

## 2018-10-29 DIAGNOSIS — Z9221 Personal history of antineoplastic chemotherapy: Secondary | ICD-10-CM

## 2018-10-29 DIAGNOSIS — Z90710 Acquired absence of both cervix and uterus: Secondary | ICD-10-CM

## 2018-10-29 DIAGNOSIS — Z7189 Other specified counseling: Secondary | ICD-10-CM

## 2018-10-29 DIAGNOSIS — Z90722 Acquired absence of ovaries, bilateral: Secondary | ICD-10-CM

## 2018-10-29 DIAGNOSIS — Z923 Personal history of irradiation: Secondary | ICD-10-CM

## 2018-10-29 NOTE — Progress Notes (Signed)
Met with patient at registration to introduce myself as Arboriculturist and to offer available resources.  Discussed the one-time $1000 Radio broadcast assistant to assist with personal expenses while going treatment.  Also, gave her the [employer] application whom assists patients whom live in [place] with personal expenses as well while going through treatment. Advised her where to return application along with supporting documents.  Gave her my card for any additional financial questions or concerns.

## 2018-10-29 NOTE — Patient Instructions (Signed)
Dr Denman George is recommending 6 doses of chemotherapy and 5 doses of vaginal radiation.  Dr Serita Grit office will coordinate the consultations for these visits.  Dr Denman George will see you back after completing treatment.  Please avoid intercourse or placement of anything vaginally (with exception of radiation treatments or examinations) for another 5 weeks.  Dr Serita Grit office can be reached at [phone].

## 2018-10-29 NOTE — Telephone Encounter (Signed)
Scheduled appt per sch msg. Called and left msg for patient.  °

## 2018-10-29 NOTE — Progress Notes (Signed)
Consult Note: Gyn-Onc  Consult was requested by Dr. [name] for the evaluation of Zoe Kim 73 y.o. female  CC:  Chief Complaint  Patient presents with  . Endometrial cancer [facility])    Assessment/Plan:  Zoe Kim  is a 73 y.o.  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   To have metastatic disease and high-risk features I am recommending adjuvant therapy with 6 cycles of carboplatin paclitaxel chemotherapy and vaginal brachii therapy.  Consultations with medical oncology and radiation oncology have been facilitated.  I discussed this treatment and anticipated prognosis.  We reviewed her pathology record and the findings at the time of surgery.  All questions were answered.  I will see her back after completing treatments.  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  The patient reports 6 months or maybe longer of intermittent vaginal spotting.  She was seen by Dr. [name] on September 19, 2018 who performed a Pap smear which revealed atypical glandular cells and an endocervical biopsy which revealed a benign endocervical polyp, and endometrial biopsy which revealed high-grade serous endometrial adenocarcinoma and adenocarcinoma FIGO grade 2.  The patient has a personal history for being nulliparous with the exception of 1 trimester miscarriage.  She denies hypertension hyperlipidemia diabetes mellitus.  She is overweight with a BMI of 32 kg/m.  Had a knee replacement but no prior abdominal surgeries.  She has no history of anticoagulant use.  Preoperative CT imaging showed no concerning evidence for metastatic disease.  Interval Hx:  On Oct 14, 2018 she underwent a robotic assisted total hysterectomy with BSO and sentinel lymph node biopsy.  Intraoperative findings were significant for  an 8 cm arcuate uterus with normal-appearing tubes and ovaries and a narrow vaginal introitus.  There was no gross extrauterine disease identified intraoperatively.  Final pathology revealed a FIGO grade 3 mixed endometrioid and serous carcinoma with invasion of less than half of the myometrium (6 of 17 mm).  Lymphovascular space invasion was present.  The cervical stroma was not involved.  The adnexa were free of disease.  There was 1 macro metastasis in 1 of 3 sentinel lymph nodes (this was occurring in the right external iliac node).  The tumor was MMR normal and MS S, and negative for HER-2.  She was determined to have stage III C1 high-grade serous endometrial cancer and was dispositioned to receive adjuvant therapy with chemotherapy and vaginal brachii therapy in accordance with NCCN guidelines.  Postoperatively overall she is done well with no specific concerns suggesting postoperative morbidity.  Current Meds:  Outpatient Encounter Medications as of 10/29/2018  Medication Sig  . acetaminophen (TYLENOL) 500 MG tablet Take 1,000 mg by mouth every 6 (six) hours as needed for moderate pain.  Marland Kitchen ibuprofen (ADVIL) 600 MG tablet Take 1 tablet (600 mg total) by mouth every 6 (six) hours as needed for moderate pain. For AFTER surgery  . MELATONIN PO Take 1 tablet by mouth at bedtime as needed (sleep).  Marland Kitchen oxybutynin (DITROPAN) 5 MG tablet Take 0.5-1 tablets (2.5-5 mg total) by mouth every 8 (eight) hours as needed for bladder spasms. (Patient taking differently: Take 5 mg by mouth at bedtime. )  . oxyCODONE (OXY IR/ROXICODONE) 5 MG immediate release tablet Take 1 tablet (5 mg total) by mouth every 4 (four) hours as needed  for severe pain. For AFTER surgery, do not take and drive  . dexamethasone (DECADRON) 4 MG tablet Take 2 tabs at the night before and 2 tab the morning of chemotherapy, every 3 weeks, by mouth (Patient not taking: Reported on 10/23/2018)  . lidocaine-prilocaine (EMLA) cream Apply to  affected area once (Patient not taking: Reported on 10/23/2018)  . ondansetron (ZOFRAN) 8 MG tablet Take 1 tablet (8 mg total) by mouth every 8 (eight) hours as needed. Start on the third day after chemotherapy. (Patient not taking: Reported on 10/23/2018)  . prochlorperazine (COMPAZINE) 10 MG tablet Take 1 tablet (10 mg total) by mouth every 6 (six) hours as needed (Nausea or vomiting). (Patient not taking: Reported on 10/23/2018)  . [DISCONTINUED] nitrofurantoin, macrocrystal-monohydrate, (MACROBID) 100 MG capsule Take 1 capsule (100 mg total) by mouth 2 (two) times daily.   No facility-administered encounter medications on file as of 10/29/2018.     Allergy:  Allergies  Allergen Reactions  . Sulfa Antibiotics     UNSPECIFIED REACTION     Social Hx:   Social History   Socioeconomic History  . Marital status: Married    Spouse name: Not on file  . Number of children: 0  . Years of education: Not on file  . Highest education level: Bachelor's degree (e.g., BA, AB, BS)  Occupational History  . Occupation: retired Tour manager  . Financial resource strain: Not hard at all  . Food insecurity:    Worry: Never true    Inability: Never true  . Transportation needs:    Medical: No    Non-medical: No  Tobacco Use  . Smoking status: Never Smoker  . Smokeless tobacco: Never Used  . Tobacco comment: smoked 2 weeks in college  Substance and Sexual Activity  . Alcohol use: No  . Drug use: No  . Sexual activity: Not Currently  Lifestyle  . Physical activity:    Days per week: 0 days    Minutes per session: 0 min  . Stress: Very much  Relationships  . Social connections:    Talks on phone: Never    Gets together: Never    Attends religious service: Never    Active member of club or organization: No    Attends meetings of clubs or organizations: Never    Relationship status: Married  . Intimate partner violence:    Fear of current or ex partner: No    Emotionally abused:  No    Physically abused: No    Forced sexual activity: No  Other Topics Concern  . Not on file  Social History Narrative  . Not on file    Past Surgical Hx:  Past Surgical History:  Procedure Laterality Date  . COLONOSCOPY    . EYE SURGERY Bilateral    cataract with lens  . ROBOTIC ASSISTED TOTAL HYSTERECTOMY WITH BILATERAL SALPINGO OOPHERECTOMY N/A 10/14/2018   Procedure: ROBOTIC ASSISTED TOTAL HYSTERECTOMY WITH BILATERAL SALPINGO OOPHORECTOMY;  Surgeon: Everitt Amber, MD;  Location: [facility];  Service: Gynecology;  Laterality: N/A;  . SENTINEL NODE BIOPSY N/A 10/14/2018   Procedure: SENTINEL LYMPH NODE BIOPSY;  Surgeon: Everitt Amber, MD;  Location: [facility];  Service: Gynecology;  Laterality: N/A;  . TONSILLECTOMY    . TOOTH EXTRACTION Right 08/27/2017  . TOTAL KNEE ARTHROPLASTY Right 07/30/2016   Procedure: TOTAL KNEE ARTHROPLASTY;  Surgeon: Elsie Saas, MD;  Location: [place];  Service: Orthopedics;  Laterality: Right;  . [place]  Past Medical Hx:  Past Medical History:  Diagnosis Date  . Anxiety   . Arthritis   . Depression   . Endometrial ca ([place])   . Heart murmur    "years ago"  . Insomnia   . Pneumonia    "long time ago"  . Primary localized osteoarthrosis of the knee, right   . Urinary urgency     Past Gynecological History:  See HPI No LMP recorded. Patient is postmenopausal.  Family Hx:  Family History  Problem Relation Age of Onset  . Heart disease Mother   . COPD Mother   . Cancer Mother        uterine  . Breast cancer Mother        diagnosed in 57s  . Heart disease Father   . COPD Brother   . Heart disease Brother   . Cancer Brother        lung ca, smoker  . Cancer Maternal Grandmother        breast  . Stroke Maternal Grandfather     Review of Systems:  Constitutional  Feels well,    ENT Normal appearing ears and nares bilaterally Skin/Breast  No rash, sores, jaundice, itching, dryness Cardiovascular  No chest pain,  shortness of breath, or edema  Pulmonary  No cough or wheeze.  Gastro Intestinal  No nausea, vomitting, or diarrhoea. No bright red blood per rectum, no abdominal pain, change in bowel movement, or constipation.  Genito Urinary  No frequency, urgency, dysuria, no bleeding Musculo Skeletal  No myalgia, arthralgia, joint swelling or pain  Neurologic  No weakness, numbness, change in gait,  Psychology  No depression, anxiety, insomnia.   Vitals:  Blood pressure 134/70, pulse 95, temperature 99 F (37.2 C), temperature source Oral, resp. rate 18, height '5\' 5"'$  (1.651 m), weight 184 lb 14.4 oz (83.9 kg), SpO2 99 %.  Physical Exam: WD in NAD Neck  Supple NROM, without any enlargements.  Lymph Node Survey No cervical supraclavicular or inguinal adenopathy Cardiovascular  Pulse normal rate, regularity and rhythm. S1 and S2 normal.  Lungs  Clear to auscultation bilateraly, without wheezes/crackles/rhonchi. Good air movement.  Skin  No rash/lesions/breakdown  Psychiatry  Alert and oriented to person, place, and time  Abdomen  Normoactive bowel sounds, abdomen soft, non-tender and overweight without evidence of hernia. Well healed incisions Back No CVA tenderness Genito Urinary  Vaginal cuff in tact with no lesions or masses. Suture material remains present.  Rectal  deferred Extremities  No bilateral cyanosis, clubbing or edema.   30 minutes of direct face to face counseling time was spent with the patient. This included discussion about prognosis, therapy recommendations and postoperative side effects and are beyond the scope of routine postoperative care.  Thereasa Solo, MD  10/29/2018, 5:42 PM

## 2018-10-31 ENCOUNTER — Ambulatory Visit (HOSPITAL_COMMUNITY): Payer: PPO

## 2018-10-31 ENCOUNTER — Other Ambulatory Visit (HOSPITAL_COMMUNITY): Payer: PPO

## 2018-10-31 ENCOUNTER — Telehealth: Payer: Self-pay | Admitting: Oncology

## 2018-10-31 NOTE — Telephone Encounter (Signed)
Left a message for Zoe Kim advising her that Dr. Alvy Bimler does not recommend sending in a prescription for lorazepam now because it may cause dependency.  She recommends trying melatonin or benadryl pm OTC first to see if they help.  Also advised her that the risk of nausea with carbo/taxol is only 20%. Advised her to call back with any questions.

## 2018-11-03 ENCOUNTER — Telehealth: Payer: Self-pay | Admitting: Oncology

## 2018-11-03 NOTE — Telephone Encounter (Signed)
Called Zoe Kim with IR and requested that CBC and CMP are drawn at her 11/06/18 port appointment.

## 2018-11-05 ENCOUNTER — Other Ambulatory Visit: Payer: Self-pay | Admitting: Student

## 2018-11-05 ENCOUNTER — Other Ambulatory Visit: Payer: Self-pay | Admitting: Radiology

## 2018-11-06 ENCOUNTER — Encounter (HOSPITAL_COMMUNITY): Payer: Self-pay

## 2018-11-06 ENCOUNTER — Ambulatory Visit (HOSPITAL_COMMUNITY)
Admission: RE | Admit: 2018-11-06 | Discharge: 2018-11-06 | Disposition: A | Discharge status: Home / Self Care | Payer: PPO | Source: Ambulatory Visit | Attending: Hematology and Oncology | Admitting: Hematology and Oncology

## 2018-11-06 ENCOUNTER — Other Ambulatory Visit: Payer: Self-pay

## 2018-11-06 DIAGNOSIS — Z8049 Family history of malignant neoplasm of other genital organs: Secondary | ICD-10-CM | POA: Diagnosis not present

## 2018-11-06 DIAGNOSIS — Z96651 Presence of right artificial knee joint: Secondary | ICD-10-CM | POA: Insufficient documentation

## 2018-11-06 DIAGNOSIS — Z823 Family history of stroke: Secondary | ICD-10-CM | POA: Insufficient documentation

## 2018-11-06 DIAGNOSIS — C541 Malignant neoplasm of endometrium: Secondary | ICD-10-CM | POA: Diagnosis not present

## 2018-11-06 DIAGNOSIS — Z803 Family history of malignant neoplasm of breast: Secondary | ICD-10-CM | POA: Diagnosis not present

## 2018-11-06 DIAGNOSIS — Z90710 Acquired absence of both cervix and uterus: Secondary | ICD-10-CM | POA: Diagnosis not present

## 2018-11-06 DIAGNOSIS — Z801 Family history of malignant neoplasm of trachea, bronchus and lung: Secondary | ICD-10-CM | POA: Diagnosis not present

## 2018-11-06 DIAGNOSIS — G4700 Insomnia, unspecified: Secondary | ICD-10-CM | POA: Diagnosis not present

## 2018-11-06 DIAGNOSIS — M1990 Unspecified osteoarthritis, unspecified site: Secondary | ICD-10-CM | POA: Insufficient documentation

## 2018-11-06 DIAGNOSIS — Z79899 Other long term (current) drug therapy: Secondary | ICD-10-CM | POA: Diagnosis not present

## 2018-11-06 DIAGNOSIS — Z882 Allergy status to sulfonamides status: Secondary | ICD-10-CM | POA: Diagnosis not present

## 2018-11-06 DIAGNOSIS — M1711 Unilateral primary osteoarthritis, right knee: Secondary | ICD-10-CM | POA: Insufficient documentation

## 2018-11-06 DIAGNOSIS — Z8249 Family history of ischemic heart disease and other diseases of the circulatory system: Secondary | ICD-10-CM | POA: Insufficient documentation

## 2018-11-06 DIAGNOSIS — Z5111 Encounter for antineoplastic chemotherapy: Secondary | ICD-10-CM | POA: Diagnosis not present

## 2018-11-06 DIAGNOSIS — Z452 Encounter for adjustment and management of vascular access device: Secondary | ICD-10-CM | POA: Diagnosis not present

## 2018-11-06 DIAGNOSIS — Z90722 Acquired absence of ovaries, bilateral: Secondary | ICD-10-CM | POA: Insufficient documentation

## 2018-11-06 HISTORY — PX: IR IMAGING GUIDED PORT INSERTION: IMG5740

## 2018-11-06 LAB — COMPREHENSIVE METABOLIC PANEL
ALT: 24 U/L (ref 0–44)
AST: 29 U/L (ref 15–41)
Albumin: 4.8 g/dL (ref 3.5–5.0)
Alkaline Phosphatase: 139 U/L — ABNORMAL HIGH (ref 38–126)
Anion gap: 9 (ref 5–15)
BUN: 13 mg/dL (ref 8–23)
CO2: 25 mmol/L (ref 22–32)
Calcium: 9.3 mg/dL (ref 8.9–10.3)
Chloride: 105 mmol/L (ref 98–111)
Creatinine, Ser: 0.76 mg/dL (ref 0.44–1.00)
GFR calc Af Amer: >60 mL/min (ref >60)
GFR calc non Af Amer: >60 mL/min (ref >60)
Glucose, Bld: 91 mg/dL (ref 70–99)
Potassium: 3.6 mmol/L (ref 3.5–5.1)
Sodium: 139 mmol/L (ref 135–145)
Total Bilirubin: 0.9 mg/dL (ref 0.3–1.2)
Total Protein: 8.1 g/dL (ref 6.5–8.1)

## 2018-11-06 LAB — CBC WITH DIFFERENTIAL/PLATELET
Abs Immature Granulocytes: 0.04 10*3/uL (ref 0.00–0.07)
Basophils Absolute: 0.1 10*3/uL (ref 0.0–0.1)
Basophils Relative: 1 %
Eosinophils Absolute: 0.8 10*3/uL — ABNORMAL HIGH (ref 0.0–0.5)
Eosinophils Relative: 10 %
HCT: 39.8 % (ref 36.0–46.0)
Hemoglobin: 12.9 g/dL (ref 12.0–15.0)
Immature Granulocytes: 1 %
Lymphocytes Relative: 24 %
Lymphs Abs: 1.9 10*3/uL (ref 0.7–4.0)
MCH: 29.2 pg (ref 26.0–34.0)
MCHC: 32.4 g/dL (ref 30.0–36.0)
MCV: 90 fL (ref 80.0–100.0)
Monocytes Absolute: 0.7 10*3/uL (ref 0.1–1.0)
Monocytes Relative: 8 %
Neutro Abs: 4.5 10*3/uL (ref 1.7–7.7)
Neutrophils Relative %: 56 %
Platelets: 303 10*3/uL (ref 150–400)
RBC: 4.42 MIL/uL (ref 3.87–5.11)
RDW: 13.2 % (ref 11.5–15.5)
WBC: 7.9 10*3/uL (ref 4.0–10.5)
nRBC: 0 % (ref 0.0–0.2)

## 2018-11-06 LAB — PROTIME-INR
INR: 0.9 (ref 0.8–1.2)
Prothrombin Time: 12.1 seconds (ref 11.4–15.2)

## 2018-11-06 MED ORDER — LIDOCAINE HCL 1 % IJ SOLN
INTRAMUSCULAR | Status: AC
  Filled 2018-11-06: qty 20

## 2018-11-06 MED ORDER — MIDAZOLAM HCL 2 MG/2ML IJ SOLN
INTRAMUSCULAR | Status: AC
  Filled 2018-11-06: qty 2

## 2018-11-06 MED ORDER — HEPARIN SOD (PORK) LOCK FLUSH 100 UNIT/ML IV SOLN
INTRAVENOUS | Status: AC | PRN
  Administered 2018-11-06: 500 [IU] via INTRAVENOUS

## 2018-11-06 MED ORDER — CEFAZOLIN SODIUM-DEXTROSE 2-4 GM/100ML-% IV SOLN
2.0000 g | INTRAVENOUS | Status: AC
  Administered 2018-11-06: 2 g via INTRAVENOUS

## 2018-11-06 MED ORDER — FENTANYL CITRATE (PF) 100 MCG/2ML IJ SOLN
INTRAMUSCULAR | Status: AC
  Filled 2018-11-06: qty 2

## 2018-11-06 MED ORDER — MIDAZOLAM HCL 2 MG/2ML IJ SOLN
INTRAMUSCULAR | Status: AC | PRN
  Administered 2018-11-06 (×2): 1 mg via INTRAVENOUS

## 2018-11-06 MED ORDER — CEFAZOLIN SODIUM-DEXTROSE 2-4 GM/100ML-% IV SOLN
INTRAVENOUS | Status: AC
  Administered 2018-11-06: 2 g via INTRAVENOUS
  Filled 2018-11-06: qty 100

## 2018-11-06 MED ORDER — LIDOCAINE HCL (PF) 1 % IJ SOLN
INTRAMUSCULAR | Status: AC | PRN
  Administered 2018-11-06 (×2): 10 mL

## 2018-11-06 MED ORDER — CEFAZOLIN SODIUM-DEXTROSE 2-4 GM/100ML-% IV SOLN
INTRAVENOUS | Status: AC
  Filled 2018-11-06: qty 100

## 2018-11-06 MED ORDER — SODIUM CHLORIDE 0.9 % IV SOLN
INTRAVENOUS | Status: DC
  Administered 2018-11-06: 14:00:00 via INTRAVENOUS

## 2018-11-06 MED ORDER — HEPARIN SOD (PORK) LOCK FLUSH 100 UNIT/ML IV SOLN
INTRAVENOUS | Status: AC
  Filled 2018-11-06: qty 5

## 2018-11-06 MED ORDER — FENTANYL CITRATE (PF) 100 MCG/2ML IJ SOLN
INTRAMUSCULAR | Status: AC | PRN
  Administered 2018-11-06 (×2): 50 ug via INTRAVENOUS

## 2018-11-06 NOTE — Procedures (Signed)
Interventional Radiology Procedure Note  Procedure: Single Lumen Power Port Placement    Access:  Right IJ vein.  Findings: Catheter tip positioned at SVC/RA junction. Port is ready for immediate use.   Complications: None  EBL: < 10 mL  Recommendations:  - Ok to shower in 24 hours - Do not submerge for 7 days - Routine line care   Mahi Zabriskie T. Travanti Mcmanus, M.D Pager:  [phone]   

## 2018-11-06 NOTE — Discharge Instructions (Signed)
Do not use EMLA cream over the dermabond (skin glue) until the cancer center nurses tell you it is healed. The EMLA cream will dissolve the skin glue and the skin will come apart resulting in an infection. Use ice in a zip lock bag over your port for 3-4 minutes prior to nurses using your new port.    Implanted Port Insertion, Care After This sheet gives you information about how to care for yourself after your procedure. Your health care provider may also give you more specific instructions. If you have problems or questions, contact your health care provider. What can I expect after the procedure? After the procedure, it is common to have:  Discomfort at the port insertion site.  Bruising on the skin over the port. This should improve over 3-4 days. Follow these instructions at home: [facility] care  After your port is placed, you will get a manufacturer's information card. The card has information about your port. Keep this card with you at all times.  Take care of the port as told by your health care provider. Ask your health care provider if you or a family member can get training for taking care of the port at home. A home health care nurse may also take care of the port.  Make sure to remember what type of port you have. Incision care      Follow instructions from your health care provider about how to take care of your port insertion site. Make sure you: ? Wash your hands with soap and water before and after you change your bandage (dressing). If soap and water are not available, use hand sanitizer. ? Change your dressing as told by your health care provider. ? Leave stitches (sutures), skin glue, or adhesive strips in place. These skin closures may need to stay in place for 2 weeks or longer. If adhesive strip edges start to loosen and curl up, you may trim the loose edges. Do not remove adhesive strips completely unless your health care provider tells you to do that.  Check your  port insertion site every day for signs of infection. Check for: ? Redness, swelling, or pain. ? Fluid or blood. ? Warmth. ? Pus or a bad smell. Activity  Return to your normal activities as told by your health care provider. Ask your health care provider what activities are safe for you.  Do not lift anything that is heavier than 10 lb (4.5 kg), or the limit that you are told, until your health care provider says that it is safe. General instructions  Take over-the-counter and prescription medicines only as told by your health care provider.  Do not take baths, swim, or use a hot tub until your health care provider approves. Ask your health care provider if you may take showers. You may only be allowed to take sponge baths.  Do not drive for 24 hours if you were given a sedative during your procedure.  Wear a medical alert bracelet in case of an emergency. This will tell any health care providers that you have a port.  Keep all follow-up visits as told by your health care provider. This is important. Contact a health care provider if:  You cannot flush your port with saline as directed, or you cannot draw blood from the port.  You have a fever or chills.  You have redness, swelling, or pain around your port insertion site.  You have fluid or blood coming from your port insertion site.  Your port insertion site feels warm to the touch.  You have pus or a bad smell coming from the port insertion site. Get help right away if:  You have chest pain or shortness of breath.  You have bleeding from your port that you cannot control. Summary  Take care of the port as told by your health care provider. Keep the manufacturer's information card with you at all times.  Change your dressing as told by your health care provider.  Contact a health care provider if you have a fever or chills or if you have redness, swelling, or pain around your port insertion site.  Keep all follow-up  visits as told by your health care provider. This information is not intended to replace advice given to you by your health care provider. Make sure you discuss any questions you have with your health care provider. Document Released: 03/11/2013 Document Revised: 12/17/2017 Document Reviewed: 12/17/2017 Elsevier Interactive Patient Education  2019 [employer].    Moderate Conscious Sedation, Adult, Care After These instructions provide you with information about caring for yourself after your procedure. Your health care provider may also give you more specific instructions. Your treatment has been planned according to current medical practices, but problems sometimes occur. Call your health care provider if you have any problems or questions after your procedure. What can I expect after the procedure? After your procedure, it is common:  To feel sleepy for several hours.  To feel clumsy and have poor balance for several hours.  To have poor judgment for several hours.  To vomit if you eat too soon. Follow these instructions at home: For at least 24 hours after the procedure:   Do not: ? Participate in activities where you could fall or become injured. ? Drive. ? Use heavy machinery. ? Drink alcohol. ? Take sleeping pills or medicines that cause drowsiness. ? Make important decisions or sign legal documents. ? Take care of children on your own.  Rest. Eating and drinking  Follow the diet recommended by your health care provider.  If you vomit: ? Drink water, juice, or soup when you can drink without vomiting. ? Make sure you have little or no nausea before eating solid foods. General instructions  Have a responsible adult stay with you until you are awake and alert.  Take over-the-counter and prescription medicines only as told by your health care provider.  If you smoke, do not smoke without supervision.  Keep all follow-up visits as told by your health care  provider. This is important. Contact a health care provider if:  You keep feeling nauseous or you keep vomiting.  You feel light-headed.  You develop a rash.  You have a fever. Get help right away if:  You have trouble breathing. This information is not intended to replace advice given to you by your health care provider. Make sure you discuss any questions you have with your health care provider. Document Released: 03/11/2013 Document Revised: 10/24/2015 Document Reviewed: 09/10/2015 Elsevier Interactive Patient Education  2019 [employer].

## 2018-11-06 NOTE — Consult Note (Signed)
Chief Complaint: Patient was seen in consultation today for port a cath placement  Referring Physician(s): [place]  Supervising Physician: Aletta Edouard  Patient Status: [facility] - Out-pt  History of Present Illness: Zoe Kim is a 73 y.o. female with history of newly diagnosed endometrial cancer, status post surgery on 10/14/2018, who presents today for Port-A-Cath placement for chemotherapy.  Past Medical History:  Diagnosis Date  . Anxiety   . Arthritis   . Depression   . Endometrial ca ([place])   . Heart murmur    "years ago"  . Insomnia   . Pneumonia    "long time ago"  . Primary localized osteoarthrosis of the knee, right   . Urinary urgency     Past Surgical History:  Procedure Laterality Date  . COLONOSCOPY    . EYE SURGERY Bilateral    cataract with lens  . ROBOTIC ASSISTED TOTAL HYSTERECTOMY WITH BILATERAL SALPINGO OOPHERECTOMY N/A 10/14/2018   Procedure: ROBOTIC ASSISTED TOTAL HYSTERECTOMY WITH BILATERAL SALPINGO OOPHORECTOMY;  Surgeon: Everitt Amber, MD;  Location: [facility];  Service: Gynecology;  Laterality: N/A;  . SENTINEL NODE BIOPSY N/A 10/14/2018   Procedure: SENTINEL LYMPH NODE BIOPSY;  Surgeon: Everitt Amber, MD;  [facility];  Service: Gynecology;  Laterality: N/A;  . TONSILLECTOMY    . TOOTH EXTRACTION Right 08/27/2017  . TOTAL KNEE ARTHROPLASTY Right 07/30/2016   Procedure: TOTAL KNEE ARTHROPLASTY;  Surgeon: Elsie Saas, MD;  Location: [place];  Service: Orthopedics;  Laterality: Right;  . VEIN LIGATION AND STRIPPING      Allergies: Sulfa antibiotics  Medications: Prior to Admission medications   Medication Sig Start Date End Date Taking? Authorizing Provider  acetaminophen (TYLENOL) 500 MG tablet Take 1,000 mg by mouth every 6 (six) hours as needed for moderate pain.    [provider]  dexamethasone (DECADRON) 4 MG tablet Take 2 tabs at the night before and 2 tab the morning of chemotherapy, every 3 weeks, by mouth  Patient not taking: Reported on 10/23/2018 10/22/18   Heath Lark, MD  ibuprofen (ADVIL) 600 MG tablet Take 1 tablet (600 mg total) by mouth every 6 (six) hours as needed for moderate pain. For AFTER surgery 10/01/18   [name] D, NP  lidocaine-prilocaine (EMLA) cream Apply to affected area once Patient not taking: Reported on 10/23/2018 10/22/18   Heath Lark, MD  MELATONIN PO Take 1 tablet by mouth at bedtime as needed (sleep).    [provider]  ondansetron (ZOFRAN) 8 MG tablet Take 1 tablet (8 mg total) by mouth every 8 (eight) hours as needed. Start on the third day after chemotherapy. Patient not taking: Reported on 10/23/2018 10/22/18   Heath Lark, MD  oxybutynin (DITROPAN) 5 MG tablet Take 0.5-1 tablets (2.5-5 mg total) by mouth every 8 (eight) hours as needed for bladder spasms. Patient taking differently: Take 5 mg by mouth at bedtime.  06/11/18   [name], MD  oxyCODONE (OXY IR/ROXICODONE) 5 MG immediate release tablet Take 1 tablet (5 mg total) by mouth every 4 (four) hours as needed for severe pain. For AFTER surgery, do not take and drive 9/56/38   Cross, Lenna Sciara D, NP  prochlorperazine (COMPAZINE) 10 MG tablet Take 1 tablet (10 mg total) by mouth every 6 (six) hours as needed (Nausea or vomiting). Patient not taking: Reported on 10/23/2018 10/22/18   Heath Lark, MD     Family History  Problem Relation Age of Onset  . Heart disease Mother   .  COPD Mother   . Cancer Mother        uterine  . Breast cancer Mother        diagnosed in 79s  . Heart disease Father   . COPD Brother   . Heart disease Brother   . Cancer Brother        lung ca, smoker  . Cancer Maternal Grandmother        breast  . Stroke Maternal Grandfather     Social History   Socioeconomic History  . Marital status: Married    Spouse name: Not on file  . Number of children: 0  . Years of education: Not on file  . Highest education level: Bachelor's degree (e.g., BA, AB, BS)   Occupational History  . Occupation: retired Tour manager  . Financial resource strain: Not hard at all  . Food insecurity:    Worry: Never true    Inability: Never true  . Transportation needs:    Medical: No    Non-medical: No  Tobacco Use  . Smoking status: Never Smoker  . Smokeless tobacco: Never Used  . Tobacco comment: smoked 2 weeks in college  Substance and Sexual Activity  . Alcohol use: No  . Drug use: No  . Sexual activity: Not Currently  Lifestyle  . Physical activity:    Days per week: 0 days    Minutes per session: 0 min  . Stress: Very much  Relationships  . Social connections:    Talks on phone: Never    Gets together: Never    Attends religious service: Never    Active member of club or organization: No    Attends meetings of clubs or organizations: Never    Relationship status: Married  Other Topics Concern  . Not on file  Social History Narrative  . Not on file      Review of Systems currently denies fever, headache, chest pain, dyspnea, cough, abdominal/back pain, nausea, vomiting or bleeding  Vital Signs: BP (!) 168/93   Pulse 81   Temp 99.1 F (37.3 C) (Oral)   Resp 16   Ht 5\' 5"  (1.651 m)   Wt 182 lb 15.7 oz (83 kg)   SpO2 100%   BMI 30.45 kg/m   Physical Exam awake, alert.  Chest clear to auscultation bilaterally.  Heart with regular rate and rhythm.  Abdomen soft, positive bowel sounds, nontender.  ext with full range of motion  Imaging: No results found.  Labs:  CBC: Recent Labs    10/09/18 0937  WBC 7.2  HGB 13.6  HCT 41.4  PLT 284    COAGS: No results for input(s): INR, APTT in the last 8760 hours.  BMP: Recent Labs    10/01/18 1317  NA 140  K 4.4  CL 106  CO2 27  GLUCOSE 93  BUN 12  CALCIUM 9.2  CREATININE 0.84  GFRNONAA >60  GFRAA >60    LIVER FUNCTION TESTS: Recent Labs    10/01/18 1317  BILITOT 0.5  AST 22  ALT 18  ALKPHOS 148*  PROT 7.4  ALBUMIN 4.2    TUMOR MARKERS: No  results for input(s): AFPTM, CEA, CA199, CHROMGRNA in the last 8760 hours.  Assessment and Plan:  73 y.o. female with history of newly diagnosed endometrial cancer, status post surgery on 10/14/2018, who presents today for Port-A-Cath placement for chemotherapy.Risks and benefits of image guided port-a-catheter placement was discussed with the patient including, but not limited to bleeding,  infection, pneumothorax, or fibrin sheath development and need for additional procedures.  All of the patient's questions were answered, patient is agreeable to proceed. Consent signed and in chart.     Thank you for this interesting consult.  I greatly enjoyed meeting Jabria H Wirtz and look forward to participating in their care.  A copy of this report was sent to the requesting provider on this date.  Electronically Signed: D. Rowe Robert, PA-C 11/06/2018, 1:31 PM   I spent a total of  25 minutes   in face to face in clinical consultation, greater than 50% of which was counseling/coordinating care for Port-A-Cath placement

## 2018-11-07 ENCOUNTER — Other Ambulatory Visit: Payer: Self-pay | Admitting: Hematology and Oncology

## 2018-11-10 ENCOUNTER — Other Ambulatory Visit: Payer: Self-pay

## 2018-11-10 ENCOUNTER — Inpatient Hospital Stay: Payer: PPO | Attending: Gynecologic Oncology

## 2018-11-10 VITALS — BP 131/88 | HR 89 | Temp 98.4°F | Resp 20

## 2018-11-10 DIAGNOSIS — T451X5A Adverse effect of antineoplastic and immunosuppressive drugs, initial encounter: Secondary | ICD-10-CM | POA: Diagnosis not present

## 2018-11-10 DIAGNOSIS — C541 Malignant neoplasm of endometrium: Secondary | ICD-10-CM | POA: Diagnosis not present

## 2018-11-10 DIAGNOSIS — Z79899 Other long term (current) drug therapy: Secondary | ICD-10-CM | POA: Insufficient documentation

## 2018-11-10 DIAGNOSIS — Z5111 Encounter for antineoplastic chemotherapy: Secondary | ICD-10-CM | POA: Diagnosis not present

## 2018-11-10 DIAGNOSIS — G620 Drug-induced polyneuropathy: Secondary | ICD-10-CM | POA: Insufficient documentation

## 2018-11-10 DIAGNOSIS — Z791 Long term (current) use of non-steroidal anti-inflammatories (NSAID): Secondary | ICD-10-CM | POA: Diagnosis not present

## 2018-11-10 DIAGNOSIS — K5909 Other constipation: Secondary | ICD-10-CM | POA: Diagnosis not present

## 2018-11-10 DIAGNOSIS — M898X9 Other specified disorders of bone, unspecified site: Secondary | ICD-10-CM | POA: Diagnosis not present

## 2018-11-10 MED ORDER — SODIUM CHLORIDE 0.9 % IV SOLN
Freq: Once | INTRAVENOUS | Status: AC
  Administered 2018-11-10: 13:00:00 via INTRAVENOUS
  Filled 2018-11-10: qty 5

## 2018-11-10 MED ORDER — SODIUM CHLORIDE 0.9 % IV SOLN
175.0000 mg/m2 | Freq: Once | INTRAVENOUS | Status: AC
  Administered 2018-11-10: 342 mg via INTRAVENOUS
  Filled 2018-11-10: qty 57

## 2018-11-10 MED ORDER — SODIUM CHLORIDE 0.9 % IV SOLN
552.6000 mg | Freq: Once | INTRAVENOUS | Status: AC
  Administered 2018-11-10: 550 mg via INTRAVENOUS
  Filled 2018-11-10: qty 55

## 2018-11-10 MED ORDER — HEPARIN SOD (PORK) LOCK FLUSH 100 UNIT/ML IV SOLN
500.0000 [IU] | Freq: Once | INTRAVENOUS | Status: AC | PRN
  Administered 2018-11-10: 500 [IU]
  Filled 2018-11-10: qty 5

## 2018-11-10 MED ORDER — DIPHENHYDRAMINE HCL 50 MG/ML IJ SOLN
50.0000 mg | Freq: Once | INTRAMUSCULAR | Status: AC
  Administered 2018-11-10: 50 mg via INTRAVENOUS

## 2018-11-10 MED ORDER — SODIUM CHLORIDE 0.9 % IV SOLN
Freq: Once | INTRAVENOUS | Status: AC
  Administered 2018-11-10: 12:00:00 via INTRAVENOUS
  Filled 2018-11-10: qty 250

## 2018-11-10 MED ORDER — PROCHLORPERAZINE EDISYLATE 10 MG/2ML IJ SOLN
10.0000 mg | Freq: Once | INTRAMUSCULAR | Status: AC
  Administered 2018-11-10: 10 mg via INTRAVENOUS

## 2018-11-10 MED ORDER — SODIUM CHLORIDE 0.9% FLUSH
10.0000 mL | INTRAVENOUS | Status: DC | PRN
  Administered 2018-11-10: 10 mL
  Filled 2018-11-10: qty 10

## 2018-11-10 MED ORDER — FAMOTIDINE IN NACL 20-0.9 MG/50ML-% IV SOLN
INTRAVENOUS | Status: AC
  Filled 2018-11-10: qty 50

## 2018-11-10 MED ORDER — PALONOSETRON HCL INJECTION 0.25 MG/5ML
0.2500 mg | Freq: Once | INTRAVENOUS | Status: AC
  Administered 2018-11-10: 0.25 mg via INTRAVENOUS

## 2018-11-10 MED ORDER — DIPHENHYDRAMINE HCL 50 MG/ML IJ SOLN
INTRAMUSCULAR | Status: AC
  Filled 2018-11-10: qty 1

## 2018-11-10 MED ORDER — PROCHLORPERAZINE EDISYLATE 10 MG/2ML IJ SOLN
INTRAMUSCULAR | Status: AC
  Filled 2018-11-10: qty 2

## 2018-11-10 MED ORDER — FAMOTIDINE IN NACL 20-0.9 MG/50ML-% IV SOLN
20.0000 mg | Freq: Once | INTRAVENOUS | Status: AC
  Administered 2018-11-10: 20 mg via INTRAVENOUS

## 2018-11-10 MED ORDER — PALONOSETRON HCL INJECTION 0.25 MG/5ML
INTRAVENOUS | Status: AC
  Filled 2018-11-10: qty 5

## 2018-11-10 NOTE — Patient Instructions (Addendum)
[facility] Discharge Instructions for Patients Receiving Chemotherapy  Today you received the following chemotherapy agents Taxol; Carboplatin  To help prevent nausea and vomiting after your treatment, we encourage you to take your nausea medication as directed  If you develop nausea and vomiting that is not controlled by your nausea medication, call the clinic.   BELOW ARE SYMPTOMS THAT SHOULD BE REPORTED IMMEDIATELY:  *FEVER GREATER THAN 100.5 F  *CHILLS WITH OR WITHOUT FEVER  NAUSEA AND VOMITING THAT IS NOT CONTROLLED WITH YOUR NAUSEA MEDICATION  *UNUSUAL SHORTNESS OF BREATH  *UNUSUAL BRUISING OR BLEEDING  TENDERNESS IN MOUTH AND THROAT WITH OR WITHOUT PRESENCE OF ULCERS  *URINARY PROBLEMS  *BOWEL PROBLEMS  UNUSUAL RASH Items with * indicate a potential emergency and should be followed up as soon as possible.  Feel free to call the clinic should you have any questions or concerns. The clinic phone number is [phone].  Please show the [place] at check-in to the Emergency Department and triage nurse.   Paclitaxel injection What is this medicine? PACLITAXEL (PAK li TAX el) is a chemotherapy drug. It targets fast dividing cells, like cancer cells, and causes these cells to die. This medicine is used to treat ovarian cancer, breast cancer, lung cancer, Kaposi's sarcoma, and other cancers. This medicine may be used for other purposes; ask your health care provider or pharmacist if you have questions. COMMON BRAND NAME(S): Onxol, Taxol What should I tell my health care provider before I take this medicine? They need to know if you have any of these conditions: -history of irregular heartbeat -liver disease -low blood counts, like low white cell, platelet, or red cell counts -lung or breathing disease, like asthma -tingling of the fingers or toes, or other nerve disorder -an unusual or allergic reaction to paclitaxel, alcohol, polyoxyethylated  castor oil, other chemotherapy, other medicines, foods, dyes, or preservatives -pregnant or trying to get pregnant -breast-feeding How should I use this medicine? This drug is given as an infusion into a vein. It is administered in a hospital or clinic by a specially trained health care professional. Talk to your pediatrician regarding the use of this medicine in children. Special care may be needed. Overdosage: If you think you have taken too much of this medicine contact a poison control center or emergency room at once. NOTE: This medicine is only for you. Do not share this medicine with others. What if I miss a dose? It is important not to miss your dose. Call your doctor or health care professional if you are unable to keep an appointment. What may interact with this medicine? Do not take this medicine with any of the following medications: -disulfiram -metronidazole This medicine may also interact with the following medications: -antiviral medicines for hepatitis, HIV or AIDS -certain antibiotics like erythromycin and clarithromycin -certain medicines for fungal infections like ketoconazole and itraconazole -certain medicines for seizures like carbamazepine, phenobarbital, phenytoin -gemfibrozil -nefazodone -rifampin -St. John's wort This list may not describe all possible interactions. Give your health care provider a list of all the medicines, herbs, non-prescription drugs, or dietary supplements you use. Also tell them if you smoke, drink alcohol, or use illegal drugs. Some items may interact with your medicine. What should I watch for while using this medicine? Your condition will be monitored carefully while you are receiving this medicine. You will need important blood work done while you are taking this medicine. This medicine can cause serious allergic reactions. To reduce your  risk you will need to take other medicine(s) before treatment with this medicine. If you experience  allergic reactions like skin rash, itching or hives, swelling of the face, lips, or tongue, tell your doctor or health care professional right away. In some cases, you may be given additional medicines to help with side effects. Follow all directions for their use. This drug may make you feel generally unwell. This is not uncommon, as chemotherapy can affect healthy cells as well as cancer cells. Report any side effects. Continue your course of treatment even though you feel ill unless your doctor tells you to stop. Call your doctor or health care professional for advice if you get a fever, chills or sore throat, or other symptoms of a cold or flu. Do not treat yourself. This drug decreases your body's ability to fight infections. Try to avoid being around people who are sick. This medicine may increase your risk to bruise or bleed. Call your doctor or health care professional if you notice any unusual bleeding. Be careful brushing and flossing your teeth or using a toothpick because you may get an infection or bleed more easily. If you have any dental work done, tell your dentist you are receiving this medicine. Avoid taking products that contain aspirin, acetaminophen, ibuprofen, naproxen, or ketoprofen unless instructed by your doctor. These medicines may hide a fever. Do not become pregnant while taking this medicine. Women should inform their doctor if they wish to become pregnant or think they might be pregnant. There is a potential for serious side effects to an unborn child. Talk to your health care professional or pharmacist for more information. Do not breast-feed an infant while taking this medicine. Men are advised not to father a child while receiving this medicine. This product may contain alcohol. Ask your pharmacist or healthcare provider if this medicine contains alcohol. Be sure to tell all healthcare providers you are taking this medicine. Certain medicines, like metronidazole and  disulfiram, can cause an unpleasant reaction when taken with alcohol. The reaction includes flushing, headache, nausea, vomiting, sweating, and increased thirst. The reaction can last from 30 minutes to several hours. What side effects may I notice from receiving this medicine? Side effects that you should report to your doctor or health care professional as soon as possible: -allergic reactions like skin rash, itching or hives, swelling of the face, lips, or tongue -breathing problems -changes in vision -fast, irregular heartbeat -high or low blood pressure -mouth sores -pain, tingling, numbness in the hands or feet -signs of decreased platelets or bleeding - bruising, pinpoint red spots on the skin, black, tarry stools, blood in the urine -signs of decreased red blood cells - unusually weak or tired, feeling faint or lightheaded, falls -signs of infection - fever or chills, cough, sore throat, pain or difficulty passing urine -signs and symptoms of liver injury like dark yellow or brown urine; general ill feeling or flu-like symptoms; light-colored stools; loss of appetite; nausea; right upper belly pain; unusually weak or tired; yellowing of the eyes or skin -swelling of the ankles, feet, hands -unusually slow heartbeat Side effects that usually do not require medical attention (report to your doctor or health care professional if they continue or are bothersome): -diarrhea -hair loss -loss of appetite -muscle or joint pain -nausea, vomiting -pain, redness, or irritation at site where injected -tiredness This list may not describe all possible side effects. Call your doctor for medical advice about side effects. You may report side effects to  FDA at 1-800-FDA-1088. Where should I keep my medicine? This drug is given in a hospital or clinic and will not be stored at home. NOTE: This sheet is a summary. It may not cover all possible information. If you have questions about this medicine,  talk to your doctor, pharmacist, or health care provider.  2019 Elsevier/Gold Standard (2017-01-22 13:14:55)  Carboplatin injection What is this medicine? CARBOPLATIN (KAR boe pla tin) is a chemotherapy drug. It targets fast dividing cells, like cancer cells, and causes these cells to die. This medicine is used to treat ovarian cancer and many other cancers. This medicine may be used for other purposes; ask your health care provider or pharmacist if you have questions. COMMON BRAND NAME(S): Paraplatin What should I tell my health care provider before I take this medicine? They need to know if you have any of these conditions: -blood disorders -hearing problems -kidney disease -recent or ongoing radiation therapy -an unusual or allergic reaction to carboplatin, cisplatin, other chemotherapy, other medicines, foods, dyes, or preservatives -pregnant or trying to get pregnant -breast-feeding How should I use this medicine? This drug is usually given as an infusion into a vein. It is administered in a hospital or clinic by a specially trained health care professional. Talk to your pediatrician regarding the use of this medicine in children. Special care may be needed. Overdosage: If you think you have taken too much of this medicine contact a poison control center or emergency room at once. NOTE: This medicine is only for you. Do not share this medicine with others. What if I miss a dose? It is important not to miss a dose. Call your doctor or health care professional if you are unable to keep an appointment. What may interact with this medicine? -medicines for seizures -medicines to increase blood counts like filgrastim, pegfilgrastim, sargramostim -some antibiotics like amikacin, gentamicin, neomycin, streptomycin, tobramycin -vaccines Talk to your doctor or health care professional before taking any of these medicines: -acetaminophen -aspirin -ibuprofen -ketoprofen -naproxen This  list may not describe all possible interactions. Give your health care provider a list of all the medicines, herbs, non-prescription drugs, or dietary supplements you use. Also tell them if you smoke, drink alcohol, or use illegal drugs. Some items may interact with your medicine. What should I watch for while using this medicine? Your condition will be monitored carefully while you are receiving this medicine. You will need important blood work done while you are taking this medicine. This drug may make you feel generally unwell. This is not uncommon, as chemotherapy can affect healthy cells as well as cancer cells. Report any side effects. Continue your course of treatment even though you feel ill unless your doctor tells you to stop. In some cases, you may be given additional medicines to help with side effects. Follow all directions for their use. Call your doctor or health care professional for advice if you get a fever, chills or sore throat, or other symptoms of a cold or flu. Do not treat yourself. This drug decreases your body's ability to fight infections. Try to avoid being around people who are sick. This medicine may increase your risk to bruise or bleed. Call your doctor or health care professional if you notice any unusual bleeding. Be careful brushing and flossing your teeth or using a toothpick because you may get an infection or bleed more easily. If you have any dental work done, tell your dentist you are receiving this medicine. Avoid taking products that  contain aspirin, acetaminophen, ibuprofen, naproxen, or ketoprofen unless instructed by your doctor. These medicines may hide a fever. Do not become pregnant while taking this medicine. Women should inform their doctor if they wish to become pregnant or think they might be pregnant. There is a potential for serious side effects to an unborn child. Talk to your health care professional or pharmacist for more information. Do not  breast-feed an infant while taking this medicine. What side effects may I notice from receiving this medicine? Side effects that you should report to your doctor or health care professional as soon as possible: -allergic reactions like skin rash, itching or hives, swelling of the face, lips, or tongue -signs of infection - fever or chills, cough, sore throat, pain or difficulty passing urine -signs of decreased platelets or bleeding - bruising, pinpoint red spots on the skin, black, tarry stools, nosebleeds -signs of decreased red blood cells - unusually weak or tired, fainting spells, lightheadedness -breathing problems -changes in hearing -changes in vision -chest pain -high blood pressure -low blood counts - This drug may decrease the number of white blood cells, red blood cells and platelets. You may be at increased risk for infections and bleeding. -nausea and vomiting -pain, swelling, redness or irritation at the injection site -pain, tingling, numbness in the hands or feet -problems with balance, talking, walking -trouble passing urine or change in the amount of urine Side effects that usually do not require medical attention (report to your doctor or health care professional if they continue or are bothersome): -hair loss -loss of appetite -metallic taste in the mouth or changes in taste This list may not describe all possible side effects. Call your doctor for medical advice about side effects. You may report side effects to FDA at 1-800-FDA-1088. Where should I keep my medicine? This drug is given in a hospital or clinic and will not be stored at home. NOTE: This sheet is a summary. It may not cover all possible information. If you have questions about this medicine, talk to your doctor, pharmacist, or health care provider.  2019 Elsevier/Gold Standard (2007-08-26 14:38:05)

## 2018-11-11 ENCOUNTER — Telehealth: Payer: Self-pay | Admitting: *Deleted

## 2018-11-11 NOTE — Telephone Encounter (Signed)
-----   Message from Priscille Loveless, RN sent at 11/10/2018  6:38 PM EDT ----- Regarding: first chemo Follow up [place] time chemo follow up taxol/ [place]

## 2018-11-11 NOTE — Telephone Encounter (Signed)
Zoe Kim returned call for chemotherapy F/U.  Patient is doing well.  Denies n/v.  Denies any new side effects or symptoms.  Bowel and bladder functioning well.  Eating and drinking well "describes as an eating binge".  Instructed to drink 64 oz minimum daily or at least the day before, of and after treatment.   Denies questions or needs at this time.  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  Encouraged to call [phone] Mon -Fri 8:00 am - 4:30 pm or anytime as needed for symptoms, changes or event outside office hours.

## 2018-11-11 NOTE — Telephone Encounter (Signed)
Called Zoe Kim at [phone].  "Automated voice messaging system mail box is full and cannot accept any messages Goodbye."  Called Spouse, [name] [phone].  Message left requesting a return call for chemotherapy follow up.  Awaiting return call from patient.

## 2018-11-13 ENCOUNTER — Telehealth: Payer: Self-pay

## 2018-11-13 NOTE — Telephone Encounter (Signed)
She called and left a message. Yesterday she started having pain in her knees, numbness in her feet and weakness in her legs. She is looking for suggestion.  Called back per Dr. Alvy Bimler, instructed to take tylenol 1000 mg TID and alternate with ibuprofen. Instructed her to call the office back if pain is not getting better and Dr. Alvy Bimler can send Rx to her pharmacy. She verbalized understanding.

## 2018-11-14 ENCOUNTER — Telehealth: Payer: Self-pay | Admitting: *Deleted

## 2018-11-14 NOTE — Telephone Encounter (Signed)
Telephone call received from patient- she is better today compared to yesterday. The Tylenol helped. Her pain is still present but not nearly as bad. She just feels horrible all over without any specific symptoms. She denies nausea/vomiting.  She is feeling a little constipated. Discussed Miralax BID Daily and to call this office if that does not help.    Patient reports that she will not continue doing treatments if she has to go through this each time. Encouraged her to discuss the side effects with Dr. Alvy Bimler next visit and we could develop a plan to help her through the days following her Taxol treatment. Patient did not recall getting any information during her infusion about the drugs or side effects. She was not expecting to feel this horrible. She reports she did not receive an after visit summary with the drug facts. This was reprinted and left for her to pick up later today. She agrees to continue treatment and will discuss this further with the doctor.

## 2018-11-17 ENCOUNTER — Telehealth: Payer: Self-pay | Admitting: *Deleted

## 2018-11-17 ENCOUNTER — Telehealth: Payer: Self-pay

## 2018-11-17 ENCOUNTER — Other Ambulatory Visit: Payer: Self-pay | Admitting: Hematology and Oncology

## 2018-11-17 DIAGNOSIS — C541 Malignant neoplasm of endometrium: Secondary | ICD-10-CM

## 2018-11-17 DIAGNOSIS — M898X9 Other specified disorders of bone, unspecified site: Secondary | ICD-10-CM | POA: Insufficient documentation

## 2018-11-17 MED ORDER — TRAMADOL HCL 50 MG PO TABS: 50.0000 mg | ORAL_TABLET | Freq: Four times a day (QID) | ORAL | 0 refills | Status: DC | PRN

## 2018-11-17 NOTE — Telephone Encounter (Signed)
Spoke with pt by phone. She states she does not feel any better than she did last week, although her spirits seem good and she was even able to laugh about some of the issues she's having.  Still having pain and weakness in lower extremities and "I just feel awful".  Pt confirms that she is still taking 1000mg  acetaminophen 3 x day but has not been taking ibuprofen.  I instructed her to add ibuprofen in between her acetaminophen doses.  She says her appetite is poor - only taking a bite or two of food 2-3 times a day.  Drinking 4 bottles of water and sometimes a glass of unsweetened iced tea.  No nausea, just extreme lack of appetite.   I instructed her to add in Boost or Ensure and try to increase water intake.  No other symptoms to report.  I instructed her to call us back in a couple of days if symptoms not improving.  Pt verbalizes understanding of above instructions.

## 2018-11-17 NOTE — Telephone Encounter (Signed)
-----   Message from Heath Lark, MD sent at 11/17/2018  7:27 AM EDT ----- Regarding: check on her Last week she was miserable from side-effects of treatment Is she better now?

## 2018-11-17 NOTE — Telephone Encounter (Addendum)
"  Zoe Kim's husband [name].  She is not feeling well.  Started feeling bad Wednesday (11-12-2018) after first treatment last Monday.  Could you give me a range of how long to expect side effects?     Lack of or no energy and weakness.  She can't do anything except lie around.    Headache, stomach ache, general body discomfort.  One degree temperature, highest has been 99.4 or 99.6 F.  Using Acetaminophen for discomfort or temperature.    Not sleeping well with stomach, joints and general body discomfort.  Melatonin does not always work.  Trouble falling asleep, staying asleep and waking up early.  No appetite.  eating a bite here and there or drink an Ensure supplement on occasion.    Drinks four to five, sixteen ounce bottled waters daily.    No trouble breathing or shortness of breath.  No chest pain.    Maybe a little constipation.  Last BM was Saturday afternoon and she is not passing any gas.  Does not use laxatives regularly. Return call number [phone]."  Routing call information to collaborative nurse and provider for review for further patient communication.

## 2018-11-17 NOTE — Telephone Encounter (Signed)
No entry 

## 2018-11-18 ENCOUNTER — Inpatient Hospital Stay: Payer: PPO

## 2018-11-18 ENCOUNTER — Telehealth: Payer: Self-pay | Admitting: *Deleted

## 2018-11-18 ENCOUNTER — Telehealth: Payer: Self-pay | Admitting: Hematology and Oncology

## 2018-11-18 ENCOUNTER — Inpatient Hospital Stay: Payer: PPO | Admitting: Hematology and Oncology

## 2018-11-18 NOTE — Telephone Encounter (Signed)
Called patient regarding upcoming [facility] appointment, per patient's request this will be a walk-in visit. °

## 2018-11-18 NOTE — Telephone Encounter (Signed)
Patient called to cancel appt for today and reschedule for Thursday. She continues to have body aches and generally feels weak and fatigued. She is managing with tylenol and alternating with ibuprofen. She has a genetic appt on Thursday at 3. Writer will contact Genetic department to see if they may be able to see her sooner than 3 on that day

## 2018-11-20 ENCOUNTER — Other Ambulatory Visit: Payer: Self-pay

## 2018-11-20 ENCOUNTER — Inpatient Hospital Stay: Payer: PPO

## 2018-11-20 ENCOUNTER — Inpatient Hospital Stay (HOSPITAL_BASED_OUTPATIENT_CLINIC_OR_DEPARTMENT_OTHER): Payer: PPO | Admitting: Licensed Clinical Social Worker

## 2018-11-20 ENCOUNTER — Inpatient Hospital Stay (HOSPITAL_BASED_OUTPATIENT_CLINIC_OR_DEPARTMENT_OTHER): Payer: PPO | Admitting: Hematology and Oncology

## 2018-11-20 ENCOUNTER — Encounter: Payer: Self-pay | Admitting: Licensed Clinical Social Worker

## 2018-11-20 DIAGNOSIS — M898X9 Other specified disorders of bone, unspecified site: Secondary | ICD-10-CM | POA: Diagnosis not present

## 2018-11-20 DIAGNOSIS — G620 Drug-induced polyneuropathy: Secondary | ICD-10-CM | POA: Diagnosis not present

## 2018-11-20 DIAGNOSIS — C541 Malignant neoplasm of endometrium: Secondary | ICD-10-CM

## 2018-11-20 DIAGNOSIS — Z79899 Other long term (current) drug therapy: Secondary | ICD-10-CM | POA: Diagnosis not present

## 2018-11-20 DIAGNOSIS — K5909 Other constipation: Secondary | ICD-10-CM | POA: Diagnosis not present

## 2018-11-20 DIAGNOSIS — T451X5A Adverse effect of antineoplastic and immunosuppressive drugs, initial encounter: Secondary | ICD-10-CM

## 2018-11-20 DIAGNOSIS — Z5111 Encounter for antineoplastic chemotherapy: Secondary | ICD-10-CM | POA: Diagnosis not present

## 2018-11-20 DIAGNOSIS — Z803 Family history of malignant neoplasm of breast: Secondary | ICD-10-CM | POA: Diagnosis not present

## 2018-11-20 DIAGNOSIS — Z801 Family history of malignant neoplasm of trachea, bronchus and lung: Secondary | ICD-10-CM | POA: Diagnosis not present

## 2018-11-20 DIAGNOSIS — Z791 Long term (current) use of non-steroidal anti-inflammatories (NSAID): Secondary | ICD-10-CM | POA: Diagnosis not present

## 2018-11-20 HISTORY — DX: Drug-induced polyneuropathy: T45.1X5A

## 2018-11-20 MED ORDER — MAGNESIUM CITRATE PO SOLN: 1.0000 | Freq: Once | ORAL | 1 refills | Status: AC

## 2018-11-20 NOTE — Progress Notes (Signed)
REFERRING PROVIDER: Heath Lark, MD [place],  [place] [address]  PRIMARY PROVIDER:  [name], MD  PRIMARY REASON FOR VISIT:  1. Endometrial cancer ([place])   2. Family history of breast cancer   3. Family history of lung cancer      HISTORY OF PRESENT ILLNESS:   Zoe Kim, a 73 y.o. female, was seen for a [facility] cancer genetics consultation at the request of Dr. Alvy Bimler due to her recent diagnosis of endometrial cancer and family history of breast cancer.  Zoe Kim presents to clinic today to discuss the possibility of a hereditary predisposition to cancer, genetic testing, and to further clarify her future cancer risks, as well as potential cancer risks for family members.   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 The treatment plan inclues TAH-BSO (performed in May, MMR normal MSI Stable) as well as chemotherapy.  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  The patient reports 6 months or maybe longer of intermittent vaginal spotting.  She was seen by Dr. [name] on September 19, 2018 who performed a Pap smear which revealed atypical glandular cells and an endocervical biopsy which revealed a benign endocervical polyp, and endometrial biopsy which revealed high-grade serous endometrial  adenocarcinoma and adenocarcinoma FIGO grade 2.    10/03/2018 Imaging   CT abdomen and pelvis 1. Enhancing soft tissue density in endometrial cavity, consistent with history of endometrial carcinoma. No evidence of extra uterine tumor extension or metastatic disease. 2. Tiny less than 1 cm anterior uterine fibroid. 3. Small hiatal hernia.  Aortic Atherosclerosis (ICD10-I70.0).   10/14/2018 Pathology Results   1. Lymph node, sentinel, biopsy, right external iliac - METASTATIC CARCINOMA PRESENT IN (1) OF (1) LYMPH NODE. 2. Lymph node, sentinel, biopsy, right deep obturator - ONE LYMPH NODE, NEGATIVE FOR CARCINOMA (0/1). 3. Lymph node, sentinel, biopsy, left external iliac - ONE LYMPH NODE, NEGATIVE FOR CARCINOMA (0/1). 4. Uterus +/- tubes/ovaries, neoplastic, cervix, bilateral fallopian tubes and ovaries - MIXED CELL CARCINOMA OF ENDOMETRIUM (MIXED ENDOMETRIOID CARCINOMA AND SEROUS CARCINOMA), WITH INVASION LESS THAN HALF OF THE MYOMETRIUM. - NO INVOLVEMENT OF UTERINE SEROSA, CERVICAL STROMA OR ADNEXA. - SEE ONCOLOGY TABLE. Microscopic Comment 4. UTERUS, CARCINOMA OR CARCINOSARCOMA Procedure: Total hysterectomy and bilateral salpingo-oophorectomy Histologic type: Mixed cell carcinoma (Endometrioid carcinoma 70% and Serous carcinoma 30%) Histologic Grade: Not applicable Myometrial Invasion: Present Depth of invasion: 6 mm Myometrial thickness: 17 mm Uterine Serosa Involvement: Not identified Cervical Stromal Involvement: Not identified Extent of Involvement of Other Organs: Not involved Lymphovascular Invasion: Present Regional Lymph Nodes: Examined: 3 Sentinel 0 Non-sentinel 3 Total Lymph nodes with metastases: 1 Isolated tumor cells (< 0.2 mm): 0 Micrometastasis (> 0.2 mm and < 2.0 mm): 0 Macrometastasis (> 2.0 mm): 1 Extracapsular extension: No definite ECE MMR/MSI Testing: Will be performed and the results reported separately Pathologic Stage Classification (pTNM, AJCC  8th edition): pT1a, pN1a FIGO  Stage: III-C1 Representative Tumor Block: 1-A Comment: The size of the metastatic deposit in the right external iliac sentinel lymph node is 32 mm. The metastatic deposit is virtually all serous carcinoma. Intradepartmental consultation was obtained   10/14/2018 Surgery   Pre-operative Diagnosis: endometrial cancer grade 3 (serous), obesity  Operation: Robotic-assisted laparoscopic total hysterectomy with bilateral salpingoophorectomy, SLN,  biopsy (22 modifier for obesity, BMI 32).  Surgeon: Donaciano Eva  Operative Findings:  : 8cm arcuate uterus, normal appearing tubes and ovaries. Obesity with BMI 32. Narrow vaginal introitus.     10/17/2018 Cancer Staging   Staging form: Corpus Uteri - Carcinoma and Carcinosarcoma, AJCC 8th Edition - Pathologic: FIGO Stage IIIC1 (pT1, pN1a, cM0) - Signed by Heath Lark, MD on 10/17/2018    Genetic Testing   Patient has genetic testing done for MMR. Results revealed patient has the following mutation(s): MMR: Normal    Genetic Testing   Patient has genetic testing done for MSI on pathology from 10/14/18. Results revealed patient has the following: MSI Stable   11/06/2018 Procedure   Placement of single lumen port a cath via right internal jugular vein. The catheter tip lies at the cavo-atrial junction. A power injectable port a cath was placed and is ready for immediate use.   11/10/2018 -  Chemotherapy   The patient had carboplatin and taxol for chemotherapy treatment.       RISK FACTORS:  Menarche was at age 88.  First live birth at age no children.  OCP use for approximately 20 years.  Ovaries intact: no.  Hysterectomy: yes.  Menopausal status: postmenopausal.  HRT use: did use, unsure how many years. Colonoscopy: yes; normal. Mammogram within the last year: no. Number of breast biopsies: 0.  Past Medical History:  Diagnosis Date  . Anxiety   . Arthritis   . Depression   . Endometrial ca  ([place])   . Family history of breast cancer   . Family history of lung cancer   . Heart murmur    "years ago"  . Insomnia   . Pneumonia    "long time ago"  . Primary localized osteoarthrosis of the knee, right   . Urinary urgency     Past Surgical History:  Procedure Laterality Date  . COLONOSCOPY    . EYE SURGERY Bilateral    cataract with lens  . IR IMAGING GUIDED PORT INSERTION  11/06/2018  . ROBOTIC ASSISTED TOTAL HYSTERECTOMY WITH BILATERAL SALPINGO OOPHERECTOMY N/A 10/14/2018   Procedure: ROBOTIC ASSISTED TOTAL HYSTERECTOMY WITH BILATERAL SALPINGO OOPHORECTOMY;  Surgeon: Everitt Amber, MD;  Location: [facility];  Service: Gynecology;  Laterality: N/A;  . SENTINEL NODE BIOPSY N/A 10/14/2018   Procedure: SENTINEL LYMPH NODE BIOPSY;  Surgeon: Everitt Amber, MD;  Location: [facility];  Service: Gynecology;  Laterality: N/A;  . TONSILLECTOMY    . TOOTH EXTRACTION Right 08/27/2017  . TOTAL KNEE ARTHROPLASTY Right 07/30/2016   Procedure: TOTAL KNEE ARTHROPLASTY;  Surgeon: Elsie Saas, MD;  Location: [facility];  Service: Orthopedics;  Laterality: Right;  . VEIN LIGATION AND STRIPPING      Social History   Socioeconomic History  . Marital status: Married    Spouse name: Not on file  . Number of children: 0  . Years of education: Not on file  . Highest education level: Bachelor's degree (e.g., BA, AB, BS)  Occupational History  . Occupation: retired Tour manager  . Financial resource strain: Not hard at all  . Food insecurity  Worry: Never true    Inability: Never true  . Transportation needs    Medical: No    Non-medical: No  Tobacco Use  . Smoking status: Never Smoker  . Smokeless tobacco: Never Used  . Tobacco comment: smoked 2 weeks in college  Substance and Sexual Activity  . Alcohol use: No  . Drug use: No  . Sexual activity: Not Currently  Lifestyle  . Physical activity    Days per week: 0 days    Minutes per session: 0 min  . Stress: Very much  Relationships  .  Social Herbalist on phone: Never    Gets together: Never    Attends religious service: Never    Active member of club or organization: No    Attends meetings of clubs or organizations: Never    Relationship status: Married  Other Topics Concern  . Not on file  Social History Narrative  . Not on file     FAMILY HISTORY:  We obtained a detailed, 4-generation family history.  Significant diagnoses are listed below: Family History  Problem Relation Age of Onset  . Heart disease Mother   . COPD Mother   . Cancer Mother        uterine (possibly, pt unsure)  . Breast cancer Mother        dx in 59s  . Heart disease Father   . COPD Brother   . Heart disease Brother   . Cancer Brother 45       lung ca, smoker  . Cancer Maternal Grandmother        breast dx early 81s  . Stroke Maternal Grandfather    Zoe Kim does not have children. She had one brother who died from lung cancer at 52, history of smoking.   Zoe Kim mother was diagnosed with breast cancer in her 11s, and possibly uterine cancer later in life. She passed away at 68. She was an only child. Patient's maternal grandmother was diagnosed with breast cancer in her early 30s and died in her early 5s. Her maternal grandfather died at Kim.   Zoe Kim father died due to heart issues at 72. The patient had one paternal uncle who did not have much contact with the family. She does not have any paternal cousins she is aware of. Her paternal grandfather died at 51 in an accident, paternal grandmother also died in her 75s.  Zoe Kim is unaware of previous family history of genetic testing for hereditary cancer risks. Patient's maternal ancestors are of [place] descent, and paternal ancestors are of [place] descent. There no reported Ashkenazi Jewish ancestry. There no known consanguinity.  GENETIC COUNSELING ASSESSMENT: Zoe Kim is a 73 y.o. female with a personal and family history which is somewhat  suggestive of a hereditary cancer syndrome and predisposition to cancer. We, therefore, discussed and recommended the following at today's visit.   DISCUSSION: We discussed that about 5-10% of endometrial and breast cancer is hereditary. Sometimes these cancers are seen as part of the same hereditary cancer syndrome. We discussed the BRCA1/2 genes in particular, noting there are other genes associated with hereditary breast cancer and hereditary endometrial cancer.  We reviewed the characteristics, features and inheritance patterns of hereditary cancer syndromes. We also discussed genetic testing, including the appropriate family members to test, the process of testing, insurance coverage and turn-around-time for results. We discussed the implications of a negative, positive and/or variant of uncertain significant result. We recommended Zoe.  Kim pursue genetic testing for the Common Hereditary Cancers gene panel.   The Common Hereditary Cancers Panel offered by [facility] includes sequencing and/or deletion duplication testing of the following 48 genes: APC, ATM, AXIN2, BARD1, BMPR1A, BRCA1, BRCA2, BRIP1, CDH1, CDKN2A (p14ARF), CDKN2A (p16INK4a), CKD4, CHEK2, CTNNA1, DICER1, EPCAM (Deletion/duplication testing only), GREM1 (promoter region deletion/duplication testing only), KIT, MEN1, MLH1, MSH2, MSH3, MSH6, MUTYH, NBN, NF1, NHTL1, PALB2, PDGFRA, PMS2, POLD1, POLE, PTEN, RAD50, RAD51C, RAD51D, RNF43, SDHB, SDHC, SDHD, SMAD4, SMARCA4. STK11, TP53, TSC1, TSC2, and VHL.  The following genes were evaluated for sequence changes only: SDHA and HOXB13 c.251G>A variant only.  Based on Zoe Kim personal and family history of cancer, she meets medical criteria for genetic testing. Despite that she meets criteria, she may still have an out of pocket cost.   PLAN: After considering the risks, benefits, and limitations, Zoe. Kim provided informed consent to pursue genetic testing and the blood sample was  sent to [facility] for analysis of the Common Hereditary Cancers Panel. Results should be available within approximately 2-3 weeks' time, at which point they will be disclosed by telephone to Zoe Kim, as will any additional recommendations warranted by these results. Zoe Kim will receive a summary of her genetic counseling visit and a copy of her results once available. This information will also be available in Epic.   Lastly, we encouraged Zoe Kim to remain in contact with cancer genetics annually so that we can continuously update the family history and inform her of any changes in cancer genetics and testing that may be of benefit for this family.   Zoe Kim questions were answered to her satisfaction today. Our contact information was provided should additional questions or concerns arise. Thank you for the referral and allowing [place] to share in the care of your patient.   Faith Rogue, Zoe Genetic Counselor [email or web address] Phone: [phone]  The patient was seen for a total of 25 minutes in face-to-face genetic counseling.  Drs. Magrinat, Lindi Adie and/or Burr Medico were available for discussion regarding this case.

## 2018-11-21 ENCOUNTER — Telehealth: Payer: Self-pay | Admitting: Oncology

## 2018-11-21 ENCOUNTER — Encounter: Payer: Self-pay | Admitting: Hematology and Oncology

## 2018-11-21 NOTE — Assessment & Plan Note (Signed)
she has mild peripheral neuropathy, likely related to side effects of treatment. °I plan to reduce the dose of treatment as outlined above.  °I explained to the patient the rationale of this strategy and reassured the patient it would not compromise the efficacy of treatment ° °

## 2018-11-21 NOTE — Progress Notes (Signed)
[place] OFFICE PROGRESS NOTE  Patient Care Team: [name], MD as PCP - General (Family Medicine) Calvert Cantor, MD as Consulting Physician (Ophthalmology)  ASSESSMENT & PLAN:  Endometrial cancer [facility]) She tolerated cycle 1 poorly, complicated by multiple side effects including peripheral neuropathy, profound fatigue, bone pain and severe constipation I plan dose reduction of Taxol We will be monitoring for the side effects closely Her bone pain and neuropathy have subsided Her energy level is fair We will continue aggressive management of severe constipation with laxatives  Other constipation She has severe constipation and is not taking laxative consistently I gave the patient written instructions We will call her again on a regular basis to make sure that she had good bowel movement with laxatives  Peripheral neuropathy due to chemotherapy [facility]) she has mild peripheral neuropathy, likely related to side effects of treatment. I plan to reduce the dose of treatment as outlined above.  I explained to the patient the rationale of this strategy and reassured the patient it would not compromise the efficacy of treatment   Bone pain She has bone pain after chemotherapy, likely due to Taxol As above, we plan to reduce the future dose of Taxol We will continue to manage her pain as needed with pain medicine   No orders of the defined types were placed in this encounter.   INTERVAL HISTORY: Please see below for problem oriented charting. 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  The patient reports 6 months or maybe longer of intermittent vaginal spotting.  She was seen by Dr. [name] on September 19, 2018 who performed a Pap smear which revealed atypical glandular cells and an endocervical biopsy which revealed a benign endocervical polyp, and endometrial biopsy which revealed high-grade serous endometrial adenocarcinoma and adenocarcinoma FIGO grade 2.    10/03/2018 Imaging   CT abdomen and pelvis 1. Enhancing soft tissue density in endometrial cavity, consistent with history of endometrial carcinoma. No evidence of extra uterine tumor extension or metastatic disease. 2. Tiny less than 1 cm anterior uterine fibroid. 3. Small hiatal hernia.  Aortic Atherosclerosis (ICD10-I70.0).   10/14/2018 Pathology Results   1. Lymph node, sentinel, biopsy, right external iliac - METASTATIC CARCINOMA PRESENT IN (1) OF (1) LYMPH NODE. 2. Lymph node, sentinel, biopsy, right deep obturator - ONE LYMPH NODE, NEGATIVE FOR CARCINOMA (0/1). 3. Lymph node, sentinel, biopsy, left external iliac - ONE LYMPH NODE, NEGATIVE FOR CARCINOMA (0/1). 4. Uterus +/- tubes/ovaries, neoplastic, cervix, bilateral fallopian tubes and ovaries - MIXED CELL CARCINOMA OF ENDOMETRIUM (MIXED ENDOMETRIOID CARCINOMA AND SEROUS CARCINOMA), WITH INVASION LESS THAN HALF OF THE  MYOMETRIUM. - NO INVOLVEMENT  OF UTERINE SEROSA, CERVICAL STROMA OR ADNEXA. - SEE ONCOLOGY TABLE. Microscopic Comment 4. UTERUS, CARCINOMA OR CARCINOSARCOMA Procedure: Total hysterectomy and bilateral salpingo-oophorectomy Histologic type: Mixed cell carcinoma (Endometrioid carcinoma 70% and Serous carcinoma 30%) Histologic Grade: Not applicable Myometrial Invasion: Present Depth of invasion: 6 mm Myometrial thickness: 17 mm Uterine Serosa Involvement: Not identified Cervical Stromal Involvement: Not identified Extent of Involvement of Other Organs: Not involved Lymphovascular Invasion: Present Regional Lymph Nodes: Examined: 3 Sentinel 0 Non-sentinel 3 Total Lymph nodes with metastases: 1 Isolated tumor cells (< 0.2 mm): 0 Micrometastasis (> 0.2 mm and < 2.0 mm): 0 Macrometastasis (> 2.0 mm): 1 Extracapsular extension: No definite ECE MMR/MSI Testing: Will be performed and the results reported separately Pathologic Stage Classification (pTNM, AJCC 8th edition): pT1a, pN1a FIGO Stage: III-C1 Representative Tumor Block: 1-A Comment: The size of the metastatic deposit in the right external iliac sentinel lymph node is 32 mm. The metastatic deposit is virtually all serous carcinoma. Intradepartmental consultation was obtained   10/14/2018 Surgery   Pre-operative Diagnosis: endometrial cancer grade 3 (serous), obesity  Operation: Robotic-assisted laparoscopic total hysterectomy with bilateral salpingoophorectomy, SLN,  biopsy (22 modifier for obesity, BMI 32).  Surgeon: Donaciano Eva  Operative Findings:  : 8cm arcuate uterus, normal appearing tubes and ovaries. Obesity with BMI 32. Narrow vaginal introitus.     10/17/2018 Cancer Staging   Staging form: Corpus Uteri - Carcinoma and Carcinosarcoma, AJCC 8th Edition - Pathologic: FIGO Stage IIIC1 (pT1, pN1a, cM0) - Signed by Heath Lark, MD on 10/17/2018    Genetic Testing   Patient has genetic testing done for MMR. Results revealed patient has the  following mutation(s): MMR: Normal    Genetic Testing   Patient has genetic testing done for MSI on pathology from 10/14/18. Results revealed patient has the following: MSI Stable   11/06/2018 Procedure   Placement of single lumen port a cath via right internal jugular vein. The catheter tip lies at the cavo-atrial junction. A power injectable port a cath was placed and is ready for immediate use.   11/10/2018 -  Chemotherapy   The patient had carboplatin and taxol for chemotherapy treatment.     11/10/2018 -  Chemotherapy   The patient had carboplatin and taxol for chemotherapy treatment.       REVIEW OF SYSTEMS:   Constitutional: Denies fevers, chills or abnormal weight loss Eyes: Denies blurriness of vision Ears, nose, mouth, throat, and face: Denies mucositis or sore throat Respiratory: Denies cough, dyspnea or wheezes Cardiovascular: Denies palpitation, chest discomfort or lower extremity swelling Skin: Denies abnormal skin rashes Lymphatics: Denies new lymphadenopathy or easy bruising Behavioral/Psych: Mood is stable, no new changes  All other systems were reviewed with the patient and are negative.  I have reviewed the past medical history, past surgical history, social history and family history with the patient and they are unchanged from previous note.  ALLERGIES:  is allergic to sulfa antibiotics.  MEDICATIONS:  Current Outpatient Medications  Medication Sig Dispense Refill  . acetaminophen (TYLENOL) 500 MG tablet Take 1,000 mg by mouth every 6 (six) hours as needed for moderate pain.    Marland Kitchen dexamethasone (DECADRON) 4 MG tablet Take 2 tabs at the night before and 2 tab the morning of chemotherapy, every 3 weeks, by mouth (Patient not taking: Reported on 10/23/2018) 24 tablet 9  . ibuprofen (ADVIL) 600 MG tablet Take 1 tablet (600 mg total) by mouth every 6 (six) hours as needed for  moderate pain. For AFTER surgery 30 tablet 1  . lidocaine-prilocaine (EMLA) cream Apply to  affected area once (Patient not taking: Reported on 10/23/2018) 30 g 3  . MELATONIN PO Take 1 tablet by mouth at bedtime as needed (sleep).    . ondansetron (ZOFRAN) 8 MG tablet Take 1 tablet (8 mg total) by mouth every 8 (eight) hours as needed. Start on the third day after chemotherapy. (Patient not taking: Reported on 10/23/2018) 30 tablet 1  . oxybutynin (DITROPAN) 5 MG tablet Take 0.5-1 tablets (2.5-5 mg total) by mouth every 8 (eight) hours as needed for bladder spasms. (Patient taking differently: Take 5 mg by mouth at bedtime. ) 270 tablet 1  . prochlorperazine (COMPAZINE) 10 MG tablet Take 1 tablet (10 mg total) by mouth every 6 (six) hours as needed (Nausea or vomiting). (Patient not taking: Reported on 10/23/2018) 30 tablet 1  . traMADol (ULTRAM) 50 MG tablet Take 1 tablet (50 mg total) by mouth every 6 (six) hours as needed. 30 tablet 0   No current facility-administered medications for this visit.     PHYSICAL EXAMINATION: ECOG PERFORMANCE STATUS: 2 - Symptomatic, <50% confined to bed  Vitals:   11/20/18 1334  BP: (!) 147/66  Pulse: 87  Resp: 18  Temp: 98.9 F (37.2 C)  SpO2: 100%   Filed Weights   11/20/18 1334  Weight: 182 lb (82.6 kg)    GENERAL:alert, no distress and comfortable SKIN: skin color, texture, turgor are normal, no rashes or significant lesions EYES: normal, Conjunctiva are pink and non-injected, sclera clear OROPHARYNX:no exudate, no erythema and lips, buccal mucosa, and tongue normal  NECK: supple, thyroid normal size, non-tender, without nodularity LYMPH:  no palpable lymphadenopathy in the cervical, axillary or inguinal LUNGS: clear to auscultation and percussion with normal breathing effort HEART: regular rate & rhythm and no murmurs and no lower extremity edema ABDOMEN:abdomen soft, non-tender and normal bowel sounds Musculoskeletal:no cyanosis of digits and no clubbing  NEURO: alert & oriented x 3 with fluent speech, no focal motor/sensory  deficits  LABORATORY DATA:  I have reviewed the data as listed    Component Value Date/Time   NA 139 11/06/2018 1334   NA 141 07/05/2016 1642   K 3.6 11/06/2018 1334   CL 105 11/06/2018 1334   CO2 25 11/06/2018 1334   GLUCOSE 91 11/06/2018 1334   BUN 13 11/06/2018 1334   BUN 10 07/05/2016 1642   CREATININE 0.76 11/06/2018 1334   CREATININE 0.86 05/22/2012 1415   CALCIUM 9.3 11/06/2018 1334   PROT 8.1 11/06/2018 1334   ALBUMIN 4.8 11/06/2018 1334   AST 29 11/06/2018 1334   ALT 24 11/06/2018 1334   ALKPHOS 139 (H) 11/06/2018 1334   BILITOT 0.9 11/06/2018 1334   GFRNONAA >60 11/06/2018 1334   GFRAA >60 11/06/2018 1334    No results found for: SPEP, UPEP  Lab Results  Component Value Date   WBC 7.9 11/06/2018   NEUTROABS 4.5 11/06/2018   HGB 12.9 11/06/2018   HCT 39.8 11/06/2018   MCV 90.0 11/06/2018   PLT 303 11/06/2018      Chemistry      Component Value Date/Time   NA 139 11/06/2018 1334   NA 141 07/05/2016 1642   K 3.6 11/06/2018 1334   CL 105 11/06/2018 1334   CO2 25 11/06/2018 1334   BUN 13 11/06/2018 1334   BUN 10 07/05/2016 1642   CREATININE 0.76 11/06/2018 1334   CREATININE 0.86 05/22/2012 1415  Component Value Date/Time   CALCIUM 9.3 11/06/2018 1334   ALKPHOS 139 (H) 11/06/2018 1334   AST 29 11/06/2018 1334   ALT 24 11/06/2018 1334   BILITOT 0.9 11/06/2018 1334       RADIOGRAPHIC STUDIES: I have personally reviewed the radiological images as listed and agreed with the findings in the report. Ir Imaging Guided Port Insertion  Result Date: 11/06/2018 CLINICAL DATA:  History of endometrial carcinoma and need for porta cath for chemotherapy. EXAM: IMPLANTED PORT A CATH PLACEMENT WITH ULTRASOUND AND FLUOROSCOPIC GUIDANCE ANESTHESIA/SEDATION: 2.0 mg IV Versed; 100 mcg IV Fentanyl Total Moderate Sedation Time:  41 minutes The patient's level of consciousness and physiologic status were continuously monitored during the procedure by Radiology  nursing. Additional Medications: 2 g IV Ancef. FLUOROSCOPY TIME:  48 seconds.  7.5 mGy. PROCEDURE: The procedure, risks, benefits, and alternatives were explained to the patient. Questions regarding the procedure were encouraged and answered. The patient understands and consents to the procedure. A time-out was performed prior to initiating the procedure. Ultrasound was utilized to confirm patency of the right internal jugular vein. The right neck and chest were prepped with chlorhexidine in a sterile fashion, and a sterile drape was applied covering the operative field. Maximum barrier sterile technique with sterile gowns and gloves were used for the procedure. Local anesthesia was provided with 1% lidocaine. After creating a small venotomy incision, a 21 gauge needle was advanced into the right internal jugular vein under direct, real-time ultrasound guidance. Ultrasound image documentation was performed. After securing guidewire access, an 8 Fr dilator was placed. A J-wire was kinked to measure appropriate catheter length. A subcutaneous port pocket was then created along the upper chest wall utilizing sharp and blunt dissection. Portable cautery was utilized. The pocket was irrigated with sterile saline. A single lumen power injectable port was chosen for placement. The 8 Fr catheter was tunneled from the port pocket site to the venotomy incision. The port was placed in the pocket. External catheter was trimmed to appropriate length based on guidewire measurement. At the venotomy, an 8 Fr peel-away sheath was placed over a guidewire. The catheter was then placed through the sheath and the sheath removed. Final catheter positioning was confirmed and documented with a fluoroscopic spot image. The port was accessed with a needle and aspirated and flushed with heparinized saline. The access needle was removed. The venotomy and port pocket incisions were closed with subcutaneous 3-0 Monocryl and subcuticular 4-0  Vicryl. Dermabond was applied to both incisions. COMPLICATIONS: COMPLICATIONS None FINDINGS: After catheter placement, the tip lies at the cavo-atrial junction. The catheter aspirates normally and is ready for immediate use. IMPRESSION: Placement of single lumen port a cath via right internal jugular vein. The catheter tip lies at the cavo-atrial junction. A power injectable port a cath was placed and is ready for immediate use. Electronically Signed   By: Aletta Edouard M.D.   On: 11/06/2018 17:25    All questions were answered. The patient knows to call the clinic with any problems, questions or concerns. No barriers to learning was detected.  I spent 25 minutes counseling the patient face to face. The total time spent in the appointment was 30 minutes and more than 50% was on counseling and review of test results  Heath Lark, MD 11/21/2018 8:06 AM

## 2018-11-21 NOTE — Assessment & Plan Note (Signed)
She has bone pain after chemotherapy, likely due to Taxol As above, we plan to reduce the future dose of Taxol We will continue to manage her pain as needed with pain medicine

## 2018-11-21 NOTE — Telephone Encounter (Addendum)
Called [place] and she said she feels much better.  She said she took Miralax this morning and had a bowel movement right afterwards.  Advised her to call back if she has constipation again.  She verbalized understanding and agreement.

## 2018-11-21 NOTE — Assessment & Plan Note (Signed)
She has severe constipation and is not taking laxative consistently I gave the patient written instructions We will call her again on a regular basis to make sure that she had good bowel movement with laxatives

## 2018-11-21 NOTE — Assessment & Plan Note (Signed)
She tolerated cycle 1 poorly, complicated by multiple side effects including peripheral neuropathy, profound fatigue, bone pain and severe constipation I plan dose reduction of Taxol We will be monitoring for the side effects closely Her bone pain and neuropathy have subsided Her energy level is fair We will continue aggressive management of severe constipation with laxatives

## 2018-11-26 ENCOUNTER — Telehealth: Payer: Self-pay | Admitting: Licensed Clinical Social Worker

## 2018-11-26 NOTE — Telephone Encounter (Signed)
Spoke with Zoe Kim about her genetic testing. We received a notice from [facility] that her sample has failed so they cannot issue a report based on that sample. I offered for Zoe Kim to try again via blood draw or saliva kit. She would like saliva kit mailed to her home for another attempt.

## 2018-12-01 ENCOUNTER — Other Ambulatory Visit: Payer: Self-pay | Admitting: Lab

## 2018-12-02 ENCOUNTER — Inpatient Hospital Stay: Payer: PPO

## 2018-12-02 ENCOUNTER — Other Ambulatory Visit: Payer: Self-pay

## 2018-12-02 ENCOUNTER — Encounter: Payer: Self-pay | Admitting: Hematology and Oncology

## 2018-12-02 ENCOUNTER — Inpatient Hospital Stay (HOSPITAL_BASED_OUTPATIENT_CLINIC_OR_DEPARTMENT_OTHER): Payer: PPO | Admitting: Hematology and Oncology

## 2018-12-02 VITALS — HR 106

## 2018-12-02 DIAGNOSIS — K5909 Other constipation: Secondary | ICD-10-CM

## 2018-12-02 DIAGNOSIS — Z791 Long term (current) use of non-steroidal anti-inflammatories (NSAID): Secondary | ICD-10-CM | POA: Diagnosis not present

## 2018-12-02 DIAGNOSIS — G620 Drug-induced polyneuropathy: Secondary | ICD-10-CM

## 2018-12-02 DIAGNOSIS — M898X9 Other specified disorders of bone, unspecified site: Secondary | ICD-10-CM | POA: Diagnosis not present

## 2018-12-02 DIAGNOSIS — T451X5A Adverse effect of antineoplastic and immunosuppressive drugs, initial encounter: Secondary | ICD-10-CM

## 2018-12-02 DIAGNOSIS — C541 Malignant neoplasm of endometrium: Secondary | ICD-10-CM

## 2018-12-02 DIAGNOSIS — Z5111 Encounter for antineoplastic chemotherapy: Secondary | ICD-10-CM | POA: Diagnosis not present

## 2018-12-02 DIAGNOSIS — Z79899 Other long term (current) drug therapy: Secondary | ICD-10-CM | POA: Diagnosis not present

## 2018-12-02 LAB — CMP (CANCER CENTER ONLY)
ALT: 17 U/L (ref 0–44)
AST: 19 U/L (ref 15–41)
Albumin: 4 g/dL (ref 3.5–5.0)
Alkaline Phosphatase: 160 U/L — ABNORMAL HIGH (ref 38–126)
Anion gap: 12 (ref 5–15)
BUN: 12 mg/dL (ref 8–23)
CO2: 22 mmol/L (ref 22–32)
Calcium: 9.6 mg/dL (ref 8.9–10.3)
Chloride: 102 mmol/L (ref 98–111)
Creatinine: 0.9 mg/dL (ref 0.44–1.00)
GFR, Est AFR Am: >60 mL/min (ref >60)
GFR, Estimated: >60 mL/min (ref >60)
Glucose, Bld: 207 mg/dL — ABNORMAL HIGH (ref 70–99)
Potassium: 3.9 mmol/L (ref 3.5–5.1)
Sodium: 136 mmol/L (ref 135–145)
Total Bilirubin: 0.4 mg/dL (ref 0.3–1.2)
Total Protein: 7.5 g/dL (ref 6.5–8.1)

## 2018-12-02 LAB — CBC WITH DIFFERENTIAL (CANCER CENTER ONLY)
Abs Immature Granulocytes: 0.05 10*3/uL (ref 0.00–0.07)
Basophils Absolute: 0 10*3/uL (ref 0.0–0.1)
Basophils Relative: 0 %
Eosinophils Absolute: 0 10*3/uL (ref 0.0–0.5)
Eosinophils Relative: 0 %
HCT: 35 % — ABNORMAL LOW (ref 36.0–46.0)
Hemoglobin: 11.9 g/dL — ABNORMAL LOW (ref 12.0–15.0)
Immature Granulocytes: 1 %
Lymphocytes Relative: 9 %
Lymphs Abs: 0.8 10*3/uL (ref 0.7–4.0)
MCH: 29.5 pg (ref 26.0–34.0)
MCHC: 34 g/dL (ref 30.0–36.0)
MCV: 86.6 fL (ref 80.0–100.0)
Monocytes Absolute: 0.1 10*3/uL (ref 0.1–1.0)
Monocytes Relative: 1 %
Neutro Abs: 7.4 10*3/uL (ref 1.7–7.7)
Neutrophils Relative %: 89 %
Platelet Count: 286 10*3/uL (ref 150–400)
RBC: 4.04 MIL/uL (ref 3.87–5.11)
RDW: 13.2 % (ref 11.5–15.5)
WBC Count: 8.3 10*3/uL (ref 4.0–10.5)
nRBC: 0 % (ref 0.0–0.2)

## 2018-12-02 MED ORDER — FAMOTIDINE IN NACL 20-0.9 MG/50ML-% IV SOLN
20.0000 mg | Freq: Once | INTRAVENOUS | Status: AC
  Administered 2018-12-02: 20 mg via INTRAVENOUS

## 2018-12-02 MED ORDER — DIPHENHYDRAMINE HCL 50 MG/ML IJ SOLN
50.0000 mg | Freq: Once | INTRAMUSCULAR | Status: AC
  Administered 2018-12-02: 50 mg via INTRAVENOUS

## 2018-12-02 MED ORDER — SODIUM CHLORIDE 0.9 % IV SOLN
552.6000 mg | Freq: Once | INTRAVENOUS | Status: AC
  Administered 2018-12-02: 550 mg via INTRAVENOUS
  Filled 2018-12-02: qty 55

## 2018-12-02 MED ORDER — SODIUM CHLORIDE 0.9 % IV SOLN
Freq: Once | INTRAVENOUS | Status: AC
  Administered 2018-12-02: 11:00:00 via INTRAVENOUS
  Filled 2018-12-02: qty 5

## 2018-12-02 MED ORDER — HEPARIN SOD (PORK) LOCK FLUSH 100 UNIT/ML IV SOLN
500.0000 [IU] | Freq: Once | INTRAVENOUS | Status: AC | PRN
  Administered 2018-12-02: 500 [IU]
  Filled 2018-12-02: qty 5

## 2018-12-02 MED ORDER — PALONOSETRON HCL INJECTION 0.25 MG/5ML
INTRAVENOUS | Status: AC
  Filled 2018-12-02: qty 5

## 2018-12-02 MED ORDER — SODIUM CHLORIDE 0.9 % IV SOLN
131.2500 mg/m2 | Freq: Once | INTRAVENOUS | Status: AC
  Administered 2018-12-02: 258 mg via INTRAVENOUS
  Filled 2018-12-02: qty 43

## 2018-12-02 MED ORDER — SODIUM CHLORIDE 0.9% FLUSH
10.0000 mL | INTRAVENOUS | Status: DC | PRN
  Administered 2018-12-02: 10 mL
  Filled 2018-12-02: qty 10

## 2018-12-02 MED ORDER — PALONOSETRON HCL INJECTION 0.25 MG/5ML
0.2500 mg | Freq: Once | INTRAVENOUS | Status: AC
  Administered 2018-12-02: 0.25 mg via INTRAVENOUS

## 2018-12-02 MED ORDER — FAMOTIDINE IN NACL 20-0.9 MG/50ML-% IV SOLN
INTRAVENOUS | Status: AC
  Filled 2018-12-02: qty 50

## 2018-12-02 MED ORDER — SODIUM CHLORIDE 0.9 % IV SOLN
Freq: Once | INTRAVENOUS | Status: AC
  Administered 2018-12-02: 10:00:00 via INTRAVENOUS
  Filled 2018-12-02: qty 250

## 2018-12-02 MED ORDER — DIPHENHYDRAMINE HCL 50 MG/ML IJ SOLN
INTRAMUSCULAR | Status: AC
  Filled 2018-12-02: qty 1

## 2018-12-02 MED ORDER — SODIUM CHLORIDE 0.9% FLUSH
10.0000 mL | Freq: Once | INTRAVENOUS | Status: AC
  Administered 2018-12-02: 10 mL
  Filled 2018-12-02: qty 10

## 2018-12-02 NOTE — Assessment & Plan Note (Signed)
As above, we will plan to reduce the dose of Taxol Her neuropathy so far is not debilitating

## 2018-12-02 NOTE — Assessment & Plan Note (Signed)
We discussed the importance of aggressive laxatives 

## 2018-12-02 NOTE — Assessment & Plan Note (Signed)
She will take tramadol as needed for bone pain

## 2018-12-02 NOTE — Patient Instructions (Signed)
   [facility] Discharge Instructions for Patients Receiving Chemotherapy  Today you received the following chemotherapy agents Taxol and Carboplatin   To help prevent nausea and vomiting after your treatment, we encourage you to take your nausea medication as directed.    If you develop nausea and vomiting that is not controlled by your nausea medication, call the clinic.   BELOW ARE SYMPTOMS THAT SHOULD BE REPORTED IMMEDIATELY:  *FEVER GREATER THAN 100.5 F  *CHILLS WITH OR WITHOUT FEVER  NAUSEA AND VOMITING THAT IS NOT CONTROLLED WITH YOUR NAUSEA MEDICATION  *UNUSUAL SHORTNESS OF BREATH  *UNUSUAL BRUISING OR BLEEDING  TENDERNESS IN MOUTH AND THROAT WITH OR WITHOUT PRESENCE OF ULCERS  *URINARY PROBLEMS  *BOWEL PROBLEMS  UNUSUAL RASH Items with * indicate a potential emergency and should be followed up as soon as possible.  Feel free to call the clinic should you have any questions or concerns. The clinic phone number is [phone].  Please show the CHEMO ALERT CARD at check-in to the Emergency Department and triage nurse.   

## 2018-12-02 NOTE — Progress Notes (Signed)
[place] OFFICE PROGRESS NOTE  Patient Care Team: [name], MD as PCP - General (Family Medicine) Calvert Cantor, MD as Consulting Physician (Ophthalmology)  ASSESSMENT & PLAN:  Endometrial cancer [facility]) She tolerated cycle 1 poorly, complicated by multiple side effects including peripheral neuropathy, profound fatigue, bone pain and severe constipation I plan dose reduction of Taxol We will be monitoring for the side effects closely Her bone pain and neuropathy have subsided  Peripheral neuropathy due to chemotherapy ([place]) As above, we will plan to reduce the dose of Taxol Her neuropathy so far is not debilitating  Bone pain She will take tramadol as needed for bone pain  Other constipation We discussed the importance of aggressive laxatives   No orders of the defined types were placed in this encounter.   INTERVAL HISTORY: Please see below for problem oriented charting. 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  The patient reports 6 months or maybe longer of intermittent vaginal spotting.  She was seen by Dr. [name] on September 19, 2018 who performed a Pap smear which  revealed atypical glandular cells and an endocervical biopsy which revealed a benign endocervical polyp, and endometrial biopsy which revealed high-grade serous endometrial adenocarcinoma and adenocarcinoma FIGO grade 2.    10/03/2018 Imaging   CT abdomen and pelvis 1. Enhancing soft tissue density in endometrial cavity, consistent with history of endometrial carcinoma. No evidence of extra uterine tumor extension or metastatic disease. 2. Tiny less than 1 cm anterior uterine fibroid. 3. Small hiatal hernia.  Aortic Atherosclerosis (ICD10-I70.0).   10/14/2018 Pathology Results   1. Lymph node, sentinel, biopsy, right external iliac - METASTATIC CARCINOMA PRESENT IN (1) OF (1) LYMPH NODE. 2. Lymph node, sentinel, biopsy, right deep obturator - ONE LYMPH NODE, NEGATIVE FOR CARCINOMA (0/1). 3. Lymph node, sentinel, biopsy, left external iliac - ONE LYMPH NODE, NEGATIVE FOR CARCINOMA (0/1). 4. Uterus +/- tubes/ovaries, neoplastic, cervix, bilateral fallopian tubes and ovaries - MIXED CELL CARCINOMA OF ENDOMETRIUM (MIXED ENDOMETRIOID CARCINOMA AND SEROUS CARCINOMA), WITH INVASION LESS THAN HALF OF THE MYOMETRIUM. - NO INVOLVEMENT OF UTERINE SEROSA, CERVICAL STROMA OR ADNEXA. - SEE ONCOLOGY TABLE. Microscopic Comment 4. UTERUS, CARCINOMA OR CARCINOSARCOMA Procedure: Total hysterectomy and bilateral salpingo-oophorectomy Histologic type: Mixed cell carcinoma (Endometrioid carcinoma 70% and Serous carcinoma 30%) Histologic Grade: Not applicable Myometrial Invasion: Present Depth of invasion: 6 mm Myometrial thickness: 17 mm Uterine Serosa Involvement: Not identified Cervical Stromal Involvement: Not identified Extent of Involvement of Other Organs: Not involved Lymphovascular Invasion: Present Regional Lymph Nodes: Examined: 3 Sentinel 0 Non-sentinel 3 Total Lymph nodes with metastases: 1 Isolated tumor cells (< 0.2 mm): 0 Micrometastasis (> 0.2 mm and < 2.0 mm): 0 Macrometastasis  (> 2.0 mm): 1 Extracapsular extension: No definite ECE MMR/MSI Testing: Will be performed and the  results reported separately Pathologic Stage Classification (pTNM, AJCC 8th edition): pT1a, pN1a FIGO Stage: III-C1 Representative Tumor Block: 1-A Comment: The size of the metastatic deposit in the right external iliac sentinel lymph node is 32 mm. The metastatic deposit is virtually all serous carcinoma. Intradepartmental consultation was obtained   10/14/2018 Surgery   Pre-operative Diagnosis: endometrial cancer grade 3 (serous), obesity  Operation: Robotic-assisted laparoscopic total hysterectomy with bilateral salpingoophorectomy, SLN,  biopsy (22 modifier for obesity, BMI 32).  Surgeon: Donaciano Eva  Operative Findings:  : 8cm arcuate uterus, normal appearing tubes and ovaries. Obesity with BMI 32. Narrow vaginal introitus.     10/17/2018 Cancer Staging   Staging form: Corpus Uteri - Carcinoma and Carcinosarcoma, AJCC 8th Edition - Pathologic: FIGO Stage IIIC1 (pT1, pN1a, cM0) - Signed by Heath Lark, MD on 10/17/2018    Genetic Testing   Patient has genetic testing done for MMR. Results revealed patient has the following mutation(s): MMR: Normal    Genetic Testing   Patient has genetic testing done for MSI on pathology from 10/14/18. Results revealed patient has the following: MSI Stable   11/06/2018 Procedure   Placement of single lumen port a cath via right internal jugular vein. The catheter tip lies at the cavo-atrial junction. A power injectable port a cath was placed and is ready for immediate use.   11/10/2018 -  Chemotherapy   The patient had carboplatin and taxol for chemotherapy treatment.     11/10/2018 -  Chemotherapy   The patient had carboplatin and taxol for chemotherapy treatment.       REVIEW OF SYSTEMS:   Constitutional: Denies fevers, chills or abnormal weight loss Eyes: Denies blurriness of vision Ears, nose, mouth, throat, and face: Denies  mucositis or sore throat Respiratory: Denies cough, dyspnea or wheezes Cardiovascular: Denies palpitation, chest discomfort or lower extremity swelling Skin: Denies abnormal skin rashes Lymphatics: Denies new lymphadenopathy or easy bruising Behavioral/Psych: Mood is stable, no new changes  All other systems were reviewed with the patient and are negative.  I have reviewed the past medical history, past surgical history, social history and family history with the patient and they are unchanged from previous note.  ALLERGIES:  is allergic to sulfa antibiotics.  MEDICATIONS:  Current Outpatient Medications  Medication Sig Dispense Refill  . acetaminophen (TYLENOL) 500 MG tablet Take 1,000 mg by mouth every 6 (six) hours as needed for moderate pain.    Marland Kitchen dexamethasone (DECADRON) 4 MG tablet Take 2 tabs at the night before and 2 tab the morning of chemotherapy, every 3 weeks, by mouth (Patient not taking: Reported on 10/23/2018) 24 tablet 9  . ibuprofen (ADVIL) 600 MG tablet Take 1 tablet (600 mg total) by mouth every 6 (six) hours as needed for moderate pain. For AFTER surgery 30 tablet 1  . lidocaine-prilocaine (EMLA) cream Apply to affected area once (Patient not taking: Reported on 10/23/2018) 30 g 3  . MELATONIN PO Take 1 tablet by mouth at bedtime as needed (sleep).    . ondansetron (ZOFRAN) 8 MG tablet Take 1 tablet (8 mg total) by mouth every 8 (eight) hours as needed. Start on the third day after chemotherapy. (Patient not taking: Reported on 10/23/2018) 30 tablet 1  . oxybutynin (DITROPAN) 5 MG tablet Take 0.5-1 tablets (2.5-5 mg total) by mouth every 8 (eight) hours as needed for bladder spasms. (Patient taking differently: Take 5 mg by mouth at bedtime. ) 270 tablet 1  . prochlorperazine (COMPAZINE) 10 MG tablet Take  1 tablet (10 mg total) by mouth every 6 (six) hours as needed (Nausea or vomiting). (Patient not taking: Reported on 10/23/2018) 30 tablet 1  . traMADol (ULTRAM) 50 MG tablet  Take 1 tablet (50 mg total) by mouth every 6 (six) hours as needed. 30 tablet 0   No current facility-administered medications for this visit.     PHYSICAL EXAMINATION: ECOG PERFORMANCE STATUS: 1 - Symptomatic but completely ambulatory  Vitals:   12/02/18 0931  BP: (!) 155/90  Pulse: (!) 121  Resp: 18  Temp: 98.3 F (36.8 C)  SpO2: 98%   Filed Weights   12/02/18 0931  Weight: 180 lb 6.4 oz (81.8 kg)    GENERAL:alert, no distress and comfortable SKIN: skin color, texture, turgor are normal, no rashes or significant lesions EYES: normal, Conjunctiva are pink and non-injected, sclera clear OROPHARYNX:no exudate, no erythema and lips, buccal mucosa, and tongue normal  NECK: supple, thyroid normal size, non-tender, without nodularity LYMPH:  no palpable lymphadenopathy in the cervical, axillary or inguinal LUNGS: clear to auscultation and percussion with normal breathing effort HEART: regular rate & rhythm and no murmurs and no lower extremity edema ABDOMEN:abdomen soft, non-tender and normal bowel sounds Musculoskeletal:no cyanosis of digits and no clubbing  NEURO: alert & oriented x 3 with fluent speech, no focal motor/sensory deficits  LABORATORY DATA:  I have reviewed the data as listed    Component Value Date/Time   NA 136 12/02/2018 0836   NA 141 07/05/2016 1642   K 3.9 12/02/2018 0836   CL 102 12/02/2018 0836   CO2 22 12/02/2018 0836   GLUCOSE 207 (H) 12/02/2018 0836   BUN 12 12/02/2018 0836   BUN 10 07/05/2016 1642   CREATININE 0.90 12/02/2018 0836   CREATININE 0.86 05/22/2012 1415   CALCIUM 9.6 12/02/2018 0836   PROT 7.5 12/02/2018 0836   ALBUMIN 4.0 12/02/2018 0836   AST 19 12/02/2018 0836   ALT 17 12/02/2018 0836   ALKPHOS 160 (H) 12/02/2018 0836   BILITOT 0.4 12/02/2018 0836   GFRNONAA >60 12/02/2018 0836   GFRAA >60 12/02/2018 0836    No results found for: SPEP, UPEP  Lab Results  Component Value Date   WBC 8.3 12/02/2018   NEUTROABS 7.4  12/02/2018   HGB 11.9 (L) 12/02/2018   HCT 35.0 (L) 12/02/2018   MCV 86.6 12/02/2018   PLT 286 12/02/2018      Chemistry      Component Value Date/Time   NA 136 12/02/2018 0836   NA 141 07/05/2016 1642   K 3.9 12/02/2018 0836   CL 102 12/02/2018 0836   CO2 22 12/02/2018 0836   BUN 12 12/02/2018 0836   BUN 10 07/05/2016 1642   CREATININE 0.90 12/02/2018 0836   CREATININE 0.86 05/22/2012 1415      Component Value Date/Time   CALCIUM 9.6 12/02/2018 0836   ALKPHOS 160 (H) 12/02/2018 0836   AST 19 12/02/2018 0836   ALT 17 12/02/2018 0836   BILITOT 0.4 12/02/2018 0836       RADIOGRAPHIC STUDIES: I have personally reviewed the radiological images as listed and agreed with the findings in the report. Ir Imaging Guided Port Insertion  Result Date: 11/06/2018 CLINICAL DATA:  History of endometrial carcinoma and need for porta cath for chemotherapy. EXAM: IMPLANTED PORT A CATH PLACEMENT WITH ULTRASOUND AND FLUOROSCOPIC GUIDANCE ANESTHESIA/SEDATION: 2.0 mg IV Versed; 100 mcg IV Fentanyl Total Moderate Sedation Time:  41 minutes The patient's level of consciousness and physiologic status were  continuously monitored during the procedure by Radiology nursing. Additional Medications: 2 g IV Ancef. FLUOROSCOPY TIME:  48 seconds.  7.5 mGy. PROCEDURE: The procedure, risks, benefits, and alternatives were explained to the patient. Questions regarding the procedure were encouraged and answered. The patient understands and consents to the procedure. A time-out was performed prior to initiating the procedure. Ultrasound was utilized to confirm patency of the right internal jugular vein. The right neck and chest were prepped with chlorhexidine in a sterile fashion, and a sterile drape was applied covering the operative field. Maximum barrier sterile technique with sterile gowns and gloves were used for the procedure. Local anesthesia was provided with 1% lidocaine. After creating a small venotomy incision,  a 21 gauge needle was advanced into the right internal jugular vein under direct, real-time ultrasound guidance. Ultrasound image documentation was performed. After securing guidewire access, an 8 Fr dilator was placed. A J-wire was kinked to measure appropriate catheter length. A subcutaneous port pocket was then created along the upper chest wall utilizing sharp and blunt dissection. Portable cautery was utilized. The pocket was irrigated with sterile saline. A single lumen power injectable port was chosen for placement. The 8 Fr catheter was tunneled from the port pocket site to the venotomy incision. The port was placed in the pocket. External catheter was trimmed to appropriate length based on guidewire measurement. At the venotomy, an 8 Fr peel-away sheath was placed over a guidewire. The catheter was then placed through the sheath and the sheath removed. Final catheter positioning was confirmed and documented with a fluoroscopic spot image. The port was accessed with a needle and aspirated and flushed with heparinized saline. The access needle was removed. The venotomy and port pocket incisions were closed with subcutaneous 3-0 Monocryl and subcuticular 4-0 Vicryl. Dermabond was applied to both incisions. COMPLICATIONS: COMPLICATIONS None FINDINGS: After catheter placement, the tip lies at the cavo-atrial junction. The catheter aspirates normally and is ready for immediate use. IMPRESSION: Placement of single lumen port a cath via right internal jugular vein. The catheter tip lies at the cavo-atrial junction. A power injectable port a cath was placed and is ready for immediate use. Electronically Signed   By: Aletta Edouard M.D.   On: 11/06/2018 17:25    All questions were answered. The patient knows to call the clinic with any problems, questions or concerns. No barriers to learning was detected.  I spent 15 minutes counseling the patient face to face. The total time spent in the appointment was 20  minutes and more than 50% was on counseling and review of test results  Heath Lark, MD 12/02/2018 9:53 AM

## 2018-12-02 NOTE — Patient Instructions (Signed)

## 2018-12-02 NOTE — Assessment & Plan Note (Signed)
She tolerated cycle 1 poorly, complicated by multiple side effects including peripheral neuropathy, profound fatigue, bone pain and severe constipation I plan dose reduction of Taxol We will be monitoring for the side effects closely Her bone pain and neuropathy have subsided

## 2018-12-02 NOTE — Progress Notes (Signed)
Dr. Alvy Bimler notified of pulse 106. Received OK to treat.

## 2018-12-03 ENCOUNTER — Telehealth: Payer: Self-pay | Admitting: Hematology and Oncology

## 2018-12-03 ENCOUNTER — Telehealth: Payer: Self-pay | Admitting: Oncology

## 2018-12-03 ENCOUNTER — Other Ambulatory Visit: Payer: Self-pay | Admitting: Hematology and Oncology

## 2018-12-03 ENCOUNTER — Telehealth: Payer: Self-pay | Admitting: *Deleted

## 2018-12-03 DIAGNOSIS — F32A Depression, unspecified: Secondary | ICD-10-CM | POA: Insufficient documentation

## 2018-12-03 DIAGNOSIS — F320 Major depressive disorder, single episode, mild: Secondary | ICD-10-CM

## 2018-12-03 MED ORDER — MIRTAZAPINE 15 MG PO TABS: 15.0000 mg | ORAL_TABLET | Freq: Every day | ORAL | 9 refills | Status: DC

## 2018-12-03 NOTE — Telephone Encounter (Signed)
[place] called back and was advised that Remeron has been sent in.  She verbalized agreement and understanding.

## 2018-12-03 NOTE — Telephone Encounter (Signed)
Left a message for Zoe Kim advising her that prescription has been sent to [facility] and gave her message from Dr. Alvy Bimler below.

## 2018-12-03 NOTE — Telephone Encounter (Signed)
I talk with patient regarding schedule  

## 2018-12-03 NOTE — Telephone Encounter (Signed)
Zoe Kim called about the prescription for the combination pill for depression that she had discussed with Dr. Alvy Bimler yesterday.  She would like it sent to [facility] in [place].  She said she is feeling much better after this cycle of chemotherapy.

## 2018-12-03 NOTE — Telephone Encounter (Signed)
Called patient to ask about when she can start treatment, lvm for a return call

## 2018-12-03 NOTE — Telephone Encounter (Signed)
I am starting her at a low dose, take at night

## 2018-12-08 ENCOUNTER — Telehealth: Payer: Self-pay

## 2018-12-08 NOTE — Telephone Encounter (Signed)
Called to follow up. She called the after hours number 7/5 with complaints of constipation, she finally had a couple of bm's per note. She is doing good today with no further constipation. She will start nightly Miralax and take senokot if needed.

## 2018-12-22 ENCOUNTER — Ambulatory Visit: Payer: PPO | Admitting: Family Medicine

## 2018-12-23 ENCOUNTER — Telehealth: Payer: Self-pay | Admitting: *Deleted

## 2018-12-23 ENCOUNTER — Inpatient Hospital Stay: Payer: PPO

## 2018-12-23 ENCOUNTER — Telehealth: Payer: Self-pay | Admitting: Oncology

## 2018-12-23 ENCOUNTER — Other Ambulatory Visit: Payer: Self-pay | Admitting: Hematology and Oncology

## 2018-12-23 ENCOUNTER — Inpatient Hospital Stay (HOSPITAL_BASED_OUTPATIENT_CLINIC_OR_DEPARTMENT_OTHER): Payer: PPO | Admitting: Hematology and Oncology

## 2018-12-23 ENCOUNTER — Other Ambulatory Visit: Payer: Self-pay

## 2018-12-23 ENCOUNTER — Inpatient Hospital Stay: Payer: PPO | Attending: Hematology and Oncology

## 2018-12-23 VITALS — HR 108

## 2018-12-23 DIAGNOSIS — G620 Drug-induced polyneuropathy: Secondary | ICD-10-CM | POA: Diagnosis not present

## 2018-12-23 DIAGNOSIS — T451X5D Adverse effect of antineoplastic and immunosuppressive drugs, subsequent encounter: Secondary | ICD-10-CM | POA: Diagnosis not present

## 2018-12-23 DIAGNOSIS — C541 Malignant neoplasm of endometrium: Secondary | ICD-10-CM

## 2018-12-23 DIAGNOSIS — Z5111 Encounter for antineoplastic chemotherapy: Secondary | ICD-10-CM | POA: Insufficient documentation

## 2018-12-23 DIAGNOSIS — T451X5A Adverse effect of antineoplastic and immunosuppressive drugs, initial encounter: Secondary | ICD-10-CM

## 2018-12-23 DIAGNOSIS — D6481 Anemia due to antineoplastic chemotherapy: Secondary | ICD-10-CM

## 2018-12-23 DIAGNOSIS — Z79899 Other long term (current) drug therapy: Secondary | ICD-10-CM | POA: Insufficient documentation

## 2018-12-23 DIAGNOSIS — Z791 Long term (current) use of non-steroidal anti-inflammatories (NSAID): Secondary | ICD-10-CM | POA: Insufficient documentation

## 2018-12-23 DIAGNOSIS — M898X9 Other specified disorders of bone, unspecified site: Secondary | ICD-10-CM

## 2018-12-23 LAB — CMP (CANCER CENTER ONLY)
ALT: 18 U/L (ref 0–44)
AST: 19 U/L (ref 15–41)
Albumin: 4 g/dL (ref 3.5–5.0)
Alkaline Phosphatase: 165 U/L — ABNORMAL HIGH (ref 38–126)
Anion gap: 13 (ref 5–15)
BUN: 12 mg/dL (ref 8–23)
CO2: 20 mmol/L — ABNORMAL LOW (ref 22–32)
Calcium: 9.5 mg/dL (ref 8.9–10.3)
Chloride: 104 mmol/L (ref 98–111)
Creatinine: 0.96 mg/dL (ref 0.44–1.00)
GFR, Est AFR Am: >60 mL/min (ref >60)
GFR, Estimated: 58 mL/min — ABNORMAL LOW (ref >60)
Glucose, Bld: 187 mg/dL — ABNORMAL HIGH (ref 70–99)
Potassium: 3.8 mmol/L (ref 3.5–5.1)
Sodium: 137 mmol/L (ref 135–145)
Total Bilirubin: 0.4 mg/dL (ref 0.3–1.2)
Total Protein: 7.5 g/dL (ref 6.5–8.1)

## 2018-12-23 LAB — CBC WITH DIFFERENTIAL (CANCER CENTER ONLY)
Abs Immature Granulocytes: 0.07 10*3/uL (ref 0.00–0.07)
Basophils Absolute: 0 10*3/uL (ref 0.0–0.1)
Basophils Relative: 0 %
Eosinophils Absolute: 0 10*3/uL (ref 0.0–0.5)
Eosinophils Relative: 0 %
HCT: 30.4 % — ABNORMAL LOW (ref 36.0–46.0)
Hemoglobin: 10.5 g/dL — ABNORMAL LOW (ref 12.0–15.0)
Immature Granulocytes: 1 %
Lymphocytes Relative: 11 %
Lymphs Abs: 0.6 10*3/uL — ABNORMAL LOW (ref 0.7–4.0)
MCH: 29.8 pg (ref 26.0–34.0)
MCHC: 34.5 g/dL (ref 30.0–36.0)
MCV: 86.4 fL (ref 80.0–100.0)
Monocytes Absolute: 0.2 10*3/uL (ref 0.1–1.0)
Monocytes Relative: 3 %
Neutro Abs: 4.8 10*3/uL (ref 1.7–7.7)
Neutrophils Relative %: 85 %
Platelet Count: 257 10*3/uL (ref 150–400)
RBC: 3.52 MIL/uL — ABNORMAL LOW (ref 3.87–5.11)
RDW: 14.9 % (ref 11.5–15.5)
WBC Count: 5.7 10*3/uL (ref 4.0–10.5)
nRBC: 0 % (ref 0.0–0.2)

## 2018-12-23 MED ORDER — SODIUM CHLORIDE 0.9% FLUSH
10.0000 mL | INTRAVENOUS | Status: DC | PRN
  Administered 2018-12-23: 16:00:00 10 mL
  Filled 2018-12-23: qty 10

## 2018-12-23 MED ORDER — PALONOSETRON HCL INJECTION 0.25 MG/5ML
0.2500 mg | Freq: Once | INTRAVENOUS | Status: AC
  Administered 2018-12-23: 0.25 mg via INTRAVENOUS

## 2018-12-23 MED ORDER — SODIUM CHLORIDE 0.9 % IV SOLN
131.2500 mg/m2 | Freq: Once | INTRAVENOUS | Status: AC
  Administered 2018-12-23: 13:00:00 258 mg via INTRAVENOUS
  Filled 2018-12-23: qty 43

## 2018-12-23 MED ORDER — DIPHENHYDRAMINE HCL 50 MG/ML IJ SOLN
50.0000 mg | Freq: Once | INTRAMUSCULAR | Status: AC
  Administered 2018-12-23: 11:00:00 50 mg via INTRAVENOUS

## 2018-12-23 MED ORDER — SODIUM CHLORIDE 0.9 % IV SOLN
552.6000 mg | Freq: Once | INTRAVENOUS | Status: AC
  Administered 2018-12-23: 550 mg via INTRAVENOUS
  Filled 2018-12-23: qty 55

## 2018-12-23 MED ORDER — DIPHENHYDRAMINE HCL 50 MG/ML IJ SOLN
INTRAMUSCULAR | Status: AC
  Filled 2018-12-23: qty 1

## 2018-12-23 MED ORDER — HEPARIN SOD (PORK) LOCK FLUSH 100 UNIT/ML IV SOLN
500.0000 [IU] | Freq: Once | INTRAVENOUS | Status: AC | PRN
  Administered 2018-12-23: 16:00:00 500 [IU]
  Filled 2018-12-23: qty 5

## 2018-12-23 MED ORDER — FAMOTIDINE IN NACL 20-0.9 MG/50ML-% IV SOLN
INTRAVENOUS | Status: AC
  Filled 2018-12-23: qty 50

## 2018-12-23 MED ORDER — FAMOTIDINE IN NACL 20-0.9 MG/50ML-% IV SOLN
20.0000 mg | Freq: Once | INTRAVENOUS | Status: AC
  Administered 2018-12-23: 11:00:00 20 mg via INTRAVENOUS

## 2018-12-23 MED ORDER — PALONOSETRON HCL INJECTION 0.25 MG/5ML
INTRAVENOUS | Status: AC
  Filled 2018-12-23: qty 5

## 2018-12-23 MED ORDER — SODIUM CHLORIDE 0.9 % IV SOLN
Freq: Once | INTRAVENOUS | Status: AC
  Administered 2018-12-23: 12:00:00 via INTRAVENOUS
  Filled 2018-12-23: qty 5

## 2018-12-23 MED ORDER — SODIUM CHLORIDE 0.9 % IV SOLN
Freq: Once | INTRAVENOUS | Status: AC
  Administered 2018-12-23: 11:00:00 via INTRAVENOUS
  Filled 2018-12-23: qty 250

## 2018-12-23 MED ORDER — SODIUM CHLORIDE 0.9% FLUSH
10.0000 mL | Freq: Once | INTRAVENOUS | Status: AC
  Administered 2018-12-23: 10 mL
  Filled 2018-12-23: qty 10

## 2018-12-23 NOTE — Telephone Encounter (Signed)
Left a message for Romie Jumper, Medical Secretary to have patient scheduled for HDR treatments.

## 2018-12-23 NOTE — Progress Notes (Signed)
Per Dr Gorsuch, ok to proceed with treatment today with elevated heart rate.  

## 2018-12-23 NOTE — Patient Instructions (Signed)
[facility] Discharge Instructions for Patients Receiving Chemotherapy  Today you received the following chemotherapy agents:  Taxol, Carboplatin  To help prevent nausea and vomiting after your treatment, we encourage you to take your nausea medication as prescribed.   If you develop nausea and vomiting that is not controlled by your nausea medication, call the clinic.   BELOW ARE SYMPTOMS THAT SHOULD BE REPORTED IMMEDIATELY:  *FEVER GREATER THAN 100.5 F  *CHILLS WITH OR WITHOUT FEVER  NAUSEA AND VOMITING THAT IS NOT CONTROLLED WITH YOUR NAUSEA MEDICATION  *UNUSUAL SHORTNESS OF BREATH  *UNUSUAL BRUISING OR BLEEDING  TENDERNESS IN MOUTH AND THROAT WITH OR WITHOUT PRESENCE OF ULCERS  *URINARY PROBLEMS  *BOWEL PROBLEMS  UNUSUAL RASH Items with * indicate a potential emergency and should be followed up as soon as possible.  Feel free to call the clinic should you have any questions or concerns. The clinic phone number is [phone].  Please show the CHEMO ALERT CARD at check-in to the Emergency Department and triage nurse.   

## 2018-12-23 NOTE — Telephone Encounter (Signed)
Called patient to ask question, lvm for a return call 

## 2018-12-24 ENCOUNTER — Telehealth: Payer: Self-pay | Admitting: *Deleted

## 2018-12-24 ENCOUNTER — Telehealth: Payer: Self-pay

## 2018-12-24 ENCOUNTER — Encounter: Payer: Self-pay | Admitting: Hematology and Oncology

## 2018-12-24 DIAGNOSIS — D6481 Anemia due to antineoplastic chemotherapy: Secondary | ICD-10-CM | POA: Insufficient documentation

## 2018-12-24 DIAGNOSIS — T451X5A Adverse effect of antineoplastic and immunosuppressive drugs, initial encounter: Secondary | ICD-10-CM

## 2018-12-24 HISTORY — DX: Anemia due to antineoplastic chemotherapy: D64.81

## 2018-12-24 HISTORY — DX: Anemia due to antineoplastic chemotherapy: T45.1X5A

## 2018-12-24 NOTE — Assessment & Plan Note (Signed)

## 2018-12-24 NOTE — Telephone Encounter (Signed)
CALLED PATIENT TO ASK ABOUT STARTING HDR [place] TXS., LVM FOR A RETURN CALL

## 2018-12-24 NOTE — Progress Notes (Signed)
[place] OFFICE PROGRESS NOTE  Patient Care Team: [name], MD as PCP - General (Family Medicine) Calvert Cantor, MD as Consulting Physician (Ophthalmology)  ASSESSMENT & PLAN:  Endometrial cancer [facility]) She tolerated cycle 1 poorly, complicated by multiple side effects including peripheral neuropathy, profound fatigue, bone pain and severe constipation With recent dose adjustment, she tolerated treatment better We will be monitoring for the side effects closely We will continue with similar dose adjustment We will alert radiation oncologist to start adjuvant radiation therapy  Bone pain She has minimum bone pain from treatment She has pain medicine to take as needed  Peripheral neuropathy due to chemotherapy ([place]) Neuropathy is stable since recent dose adjustment We will monitor carefully  Anemia due to antineoplastic chemotherapy This is likely due to recent treatment. The patient denies recent history of bleeding such as epistaxis, hematuria or hematochezia. She is asymptomatic from the anemia. I will observe for now.  She does not require transfusion now. I will continue the chemotherapy at current dose without dosage adjustment.  If the anemia gets progressive worse in the future, I might have to delay her treatment or adjust the chemotherapy dose.    No orders of the defined types were placed in this encounter.   INTERVAL HISTORY: Please see below for problem oriented charting. 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  The patient reports 6 months or maybe longer of intermittent vaginal spotting.  She was seen by Dr. [name] on September 19, 2018 who performed a Pap smear which revealed atypical glandular cells and an endocervical biopsy which revealed a benign endocervical polyp, and endometrial biopsy which revealed high-grade serous endometrial adenocarcinoma and adenocarcinoma FIGO grade 2.    10/03/2018 Imaging   CT abdomen and pelvis 1. Enhancing soft tissue density in endometrial cavity, consistent with history of endometrial carcinoma. No evidence of extra uterine tumor extension or metastatic disease. 2. Tiny less than 1 cm anterior uterine fibroid. 3. Small hiatal hernia.  Aortic Atherosclerosis (ICD10-I70.0).   10/14/2018 Pathology Results   1. Lymph node, sentinel, biopsy, right external iliac - METASTATIC CARCINOMA PRESENT IN (1) OF (1) LYMPH NODE. 2. Lymph node, sentinel, biopsy, right deep obturator - ONE LYMPH NODE, NEGATIVE FOR CARCINOMA (0/1). 3. Lymph node, sentinel, biopsy, left external iliac - ONE LYMPH NODE, NEGATIVE FOR CARCINOMA (0/1). 4. Uterus +/- tubes/ovaries, neoplastic, cervix, bilateral fallopian tubes and ovaries - MIXED CELL CARCINOMA OF ENDOMETRIUM (MIXED ENDOMETRIOID CARCINOMA AND SEROUS CARCINOMA), WITH INVASION LESS THAN HALF OF THE MYOMETRIUM. - NO INVOLVEMENT OF UTERINE SEROSA, CERVICAL STROMA OR ADNEXA. - SEE ONCOLOGY TABLE. Microscopic Comment 4. UTERUS, CARCINOMA OR CARCINOSARCOMA Procedure: Total hysterectomy and bilateral salpingo-oophorectomy Histologic type: Mixed cell carcinoma  (Endometrioid carcinoma 70% and Serous carcinoma 30%) Histologic Grade:  Not applicable Myometrial Invasion: Present Depth of invasion: 6 mm Myometrial thickness: 17 mm Uterine Serosa Involvement: Not identified Cervical Stromal Involvement: Not identified Extent of Involvement of Other Organs: Not involved Lymphovascular Invasion: Present Regional Lymph Nodes: Examined: 3 Sentinel 0 Non-sentinel 3 Total Lymph nodes with metastases: 1 Isolated tumor cells (< 0.2 mm): 0 Micrometastasis (> 0.2 mm and < 2.0 mm): 0 Macrometastasis (> 2.0 mm): 1 Extracapsular extension: No definite ECE MMR/MSI Testing: Will be performed and the results reported separately Pathologic Stage Classification (pTNM, AJCC 8th edition): pT1a, pN1a FIGO Stage: III-C1 Representative Tumor Block: 1-A Comment: The size of the metastatic deposit in the right external iliac sentinel lymph node is 32 mm. The metastatic deposit is virtually all serous carcinoma. Intradepartmental consultation was obtained   10/14/2018 Surgery   Pre-operative Diagnosis: endometrial cancer grade 3 (serous), obesity  Operation: Robotic-assisted laparoscopic total hysterectomy with bilateral salpingoophorectomy, SLN,  biopsy (22 modifier for obesity, BMI 32).  Surgeon: Donaciano Eva  Operative Findings:  : 8cm arcuate uterus, normal appearing tubes and ovaries. Obesity with BMI 32. Narrow vaginal introitus.     10/17/2018 Cancer Staging   Staging form: Corpus Uteri - Carcinoma and Carcinosarcoma, AJCC 8th Edition - Pathologic: FIGO Stage IIIC1 (pT1, pN1a, cM0) - Signed by Heath Lark, MD on 10/17/2018    Genetic Testing   Patient has genetic testing done for MMR. Results revealed patient has the following mutation(s): MMR: Normal    Genetic Testing   Patient has genetic testing done for MSI on pathology from 10/14/18. Results revealed patient has the following: MSI Stable   11/06/2018 Procedure   Placement of single lumen  port a cath via right internal jugular vein. The catheter tip lies at the cavo-atrial junction. A power injectable port a cath was placed and is ready for immediate use.   11/10/2018 -  Chemotherapy   The patient had carboplatin and taxol for chemotherapy treatment.     11/10/2018 -  Chemotherapy   The patient had carboplatin and taxol for chemotherapy treatment.       REVIEW OF SYSTEMS:   Constitutional: Denies fevers, chills or abnormal weight loss Eyes: Denies blurriness of vision Ears, nose, mouth, throat, and face: Denies mucositis or sore throat Respiratory: Denies cough, dyspnea or wheezes Cardiovascular: Denies palpitation, chest discomfort or lower extremity swelling Gastrointestinal:  Denies nausea, heartburn or change in bowel habits Skin: Denies abnormal skin rashes Lymphatics: Denies new lymphadenopathy or easy bruising Neurological:Denies numbness, tingling or new weaknesses Behavioral/Psych: Mood is stable, no new changes  All other systems were reviewed with the patient and are negative.  I have reviewed the past medical history, past surgical history, social history and family history with the patient and they are unchanged from previous note.  ALLERGIES:  is allergic to sulfa antibiotics.  MEDICATIONS:  Current Outpatient Medications  Medication Sig Dispense Refill  . acetaminophen (TYLENOL) 500 MG tablet Take 1,000 mg by mouth every 6 (six) hours as needed for moderate pain.    Marland Kitchen dexamethasone (DECADRON) 4 MG tablet Take 2 tabs at the night before and 2 tab the morning of chemotherapy, every 3 weeks, by mouth (Patient not taking: Reported on 10/23/2018) 24 tablet 9  . ibuprofen (ADVIL) 600 MG tablet Take 1 tablet (600 mg total) by mouth every 6 (six) hours as needed for moderate pain. For AFTER surgery 30 tablet 1  . lidocaine-prilocaine (EMLA) cream Apply to affected area once (Patient not taking: Reported on 10/23/2018) 30 g 3  .  MELATONIN PO Take 1 tablet by mouth  at bedtime as needed (sleep).    . mirtazapine (REMERON) 15 MG tablet Take 1 tablet (15 mg total) by mouth at bedtime. 30 tablet 9  . ondansetron (ZOFRAN) 8 MG tablet Take 1 tablet (8 mg total) by mouth every 8 (eight) hours as needed. Start on the third day after chemotherapy. (Patient not taking: Reported on 10/23/2018) 30 tablet 1  . oxybutynin (DITROPAN) 5 MG tablet Take 0.5-1 tablets (2.5-5 mg total) by mouth every 8 (eight) hours as needed for bladder spasms. (Patient taking differently: Take 5 mg by mouth at bedtime. ) 270 tablet 1  . prochlorperazine (COMPAZINE) 10 MG tablet Take 1 tablet (10 mg total) by mouth every 6 (six) hours as needed (Nausea or vomiting). (Patient not taking: Reported on 10/23/2018) 30 tablet 1  . traMADol (ULTRAM) 50 MG tablet Take 1 tablet (50 mg total) by mouth every 6 (six) hours as needed. 30 tablet 0   No current facility-administered medications for this visit.     PHYSICAL EXAMINATION: ECOG PERFORMANCE STATUS: 1 - Symptomatic but completely ambulatory  Vitals:   12/23/18 1018  BP: 132/78  Pulse: (!) 120  Resp: 18  Temp: 98 F (36.7 C)  SpO2: 100%   Filed Weights   12/23/18 1018  Weight: 179 lb 3.2 oz (81.3 kg)    GENERAL:alert, no distress and comfortable SKIN: skin color, texture, turgor are normal, no rashes or significant lesions EYES: normal, Conjunctiva are pink and non-injected, sclera clear OROPHARYNX:no exudate, no erythema and lips, buccal mucosa, and tongue normal  NECK: supple, thyroid normal size, non-tender, without nodularity LYMPH:  no palpable lymphadenopathy in the cervical, axillary or inguinal LUNGS: clear to auscultation and percussion with normal breathing effort HEART: regular rate & rhythm and no murmurs and no lower extremity edema ABDOMEN:abdomen soft, non-tender and normal bowel sounds Musculoskeletal:no cyanosis of digits and no clubbing  NEURO: alert & oriented x 3 with fluent speech, no focal motor/sensory  deficits  LABORATORY DATA:  I have reviewed the data as listed    Component Value Date/Time   NA 137 12/23/2018 0825   NA 141 07/05/2016 1642   K 3.8 12/23/2018 0825   CL 104 12/23/2018 0825   CO2 20 (L) 12/23/2018 0825   GLUCOSE 187 (H) 12/23/2018 0825   BUN 12 12/23/2018 0825   BUN 10 07/05/2016 1642   CREATININE 0.96 12/23/2018 0825   CREATININE 0.86 05/22/2012 1415   CALCIUM 9.5 12/23/2018 0825   PROT 7.5 12/23/2018 0825   ALBUMIN 4.0 12/23/2018 0825   AST 19 12/23/2018 0825   ALT 18 12/23/2018 0825   ALKPHOS 165 (H) 12/23/2018 0825   BILITOT 0.4 12/23/2018 0825   GFRNONAA 58 (L) 12/23/2018 0825   GFRAA >60 12/23/2018 0825    No results found for: SPEP, UPEP  Lab Results  Component Value Date   WBC 5.7 12/23/2018   NEUTROABS 4.8 12/23/2018   HGB 10.5 (L) 12/23/2018   HCT 30.4 (L) 12/23/2018   MCV 86.4 12/23/2018   PLT 257 12/23/2018      Chemistry      Component Value Date/Time   NA 137 12/23/2018 0825   NA 141 07/05/2016 1642   K 3.8 12/23/2018 0825   CL 104 12/23/2018 0825   CO2 20 (L) 12/23/2018 0825   BUN 12 12/23/2018 0825   BUN 10 07/05/2016 1642   CREATININE 0.96 12/23/2018 0825   CREATININE 0.86 05/22/2012 1415  Component Value Date/Time   CALCIUM 9.5 12/23/2018 0825   ALKPHOS 165 (H) 12/23/2018 0825   AST 19 12/23/2018 0825   ALT 18 12/23/2018 0825   BILITOT 0.4 12/23/2018 0825       All questions were answered. The patient knows to call the clinic with any problems, questions or concerns. No barriers to learning was detected.  I spent 15 minutes counseling the patient face to face. The total time spent in the appointment was 20 minutes and more than 50% was on counseling and review of test results  Heath Lark, MD 12/24/2018 1:45 PM

## 2018-12-24 NOTE — Assessment & Plan Note (Signed)
Neuropathy is stable since recent dose adjustment We will monitor carefully

## 2018-12-24 NOTE — Telephone Encounter (Signed)
Spoke with pt re: [place] HDR treatments. Pt questioning the process of treatments. Explained the rationale, process, and timeline. Pt verbalized better understanding. Pt requested to schedule HDR treatments. Pt was transferred to scheduler per request. Loma Sousa, RN BSN

## 2018-12-24 NOTE — Telephone Encounter (Signed)
Called patient to inform of New [facility], spoke with patient and she is aware of all appt.dates and times.

## 2018-12-24 NOTE — Assessment & Plan Note (Signed)
She has minimum bone pain from treatment She has pain medicine to take as needed

## 2018-12-24 NOTE — Assessment & Plan Note (Signed)
She tolerated cycle 1 poorly, complicated by multiple side effects including peripheral neuropathy, profound fatigue, bone pain and severe constipation With recent dose adjustment, she tolerated treatment better We will be monitoring for the side effects closely We will continue with similar dose adjustment We will alert radiation oncologist to start adjuvant radiation therapy

## 2018-12-29 ENCOUNTER — Telehealth: Payer: Self-pay | Admitting: *Deleted

## 2018-12-29 NOTE — Telephone Encounter (Signed)
CALLED PATIENT TO REMIND OF NEW HDR [place] FOR 12-30-18, LVM FOR A RETURN CALL

## 2018-12-30 ENCOUNTER — Telehealth: Payer: Self-pay | Admitting: *Deleted

## 2018-12-30 ENCOUNTER — Other Ambulatory Visit: Payer: Self-pay

## 2018-12-30 ENCOUNTER — Inpatient Hospital Stay (HOSPITAL_COMMUNITY)
Admission: EM | Admit: 2018-12-30 | Discharge: 2019-01-03 | DRG: 810 | Disposition: A | Discharge status: Home / Self Care | Payer: PPO | Attending: Internal Medicine | Admitting: Internal Medicine

## 2018-12-30 ENCOUNTER — Ambulatory Visit: Payer: PPO | Admitting: Radiation Oncology

## 2018-12-30 ENCOUNTER — Ambulatory Visit: Payer: PPO

## 2018-12-30 ENCOUNTER — Encounter (HOSPITAL_COMMUNITY): Payer: Self-pay

## 2018-12-30 ENCOUNTER — Emergency Department (HOSPITAL_COMMUNITY): Payer: PPO

## 2018-12-30 ENCOUNTER — Telehealth: Payer: Self-pay

## 2018-12-30 DIAGNOSIS — T451X5A Adverse effect of antineoplastic and immunosuppressive drugs, initial encounter: Secondary | ICD-10-CM | POA: Diagnosis present

## 2018-12-30 DIAGNOSIS — Z95828 Presence of other vascular implants and grafts: Secondary | ICD-10-CM

## 2018-12-30 DIAGNOSIS — Z803 Family history of malignant neoplasm of breast: Secondary | ICD-10-CM | POA: Diagnosis not present

## 2018-12-30 DIAGNOSIS — Z79891 Long term (current) use of opiate analgesic: Secondary | ICD-10-CM | POA: Diagnosis not present

## 2018-12-30 DIAGNOSIS — D6481 Anemia due to antineoplastic chemotherapy: Secondary | ICD-10-CM | POA: Diagnosis not present

## 2018-12-30 DIAGNOSIS — R509 Fever, unspecified: Secondary | ICD-10-CM | POA: Diagnosis not present

## 2018-12-30 DIAGNOSIS — R5081 Fever presenting with conditions classified elsewhere: Secondary | ICD-10-CM | POA: Diagnosis not present

## 2018-12-30 DIAGNOSIS — Z79890 Hormone replacement therapy (postmenopausal): Secondary | ICD-10-CM | POA: Diagnosis not present

## 2018-12-30 DIAGNOSIS — C541 Malignant neoplasm of endometrium: Secondary | ICD-10-CM | POA: Diagnosis not present

## 2018-12-30 DIAGNOSIS — Z882 Allergy status to sulfonamides status: Secondary | ICD-10-CM | POA: Diagnosis not present

## 2018-12-30 DIAGNOSIS — K5900 Constipation, unspecified: Secondary | ICD-10-CM | POA: Diagnosis not present

## 2018-12-30 DIAGNOSIS — K5909 Other constipation: Secondary | ICD-10-CM

## 2018-12-30 DIAGNOSIS — Z801 Family history of malignant neoplasm of trachea, bronchus and lung: Secondary | ICD-10-CM

## 2018-12-30 DIAGNOSIS — R531 Weakness: Secondary | ICD-10-CM | POA: Diagnosis not present

## 2018-12-30 DIAGNOSIS — G4700 Insomnia, unspecified: Secondary | ICD-10-CM | POA: Diagnosis present

## 2018-12-30 DIAGNOSIS — R000 Tachycardia, unspecified: Secondary | ICD-10-CM | POA: Diagnosis not present

## 2018-12-30 DIAGNOSIS — D709 Neutropenia, unspecified: Secondary | ICD-10-CM | POA: Diagnosis not present

## 2018-12-30 DIAGNOSIS — Z79899 Other long term (current) drug therapy: Secondary | ICD-10-CM | POA: Diagnosis not present

## 2018-12-30 DIAGNOSIS — Z20828 Contact with and (suspected) exposure to other viral communicable diseases: Secondary | ICD-10-CM | POA: Diagnosis not present

## 2018-12-30 LAB — CBC WITH DIFFERENTIAL/PLATELET
Abs Immature Granulocytes: 0.01 10*3/uL (ref 0.00–0.07)
Basophils Absolute: 0 10*3/uL (ref 0.0–0.1)
Basophils Relative: 1 %
Eosinophils Absolute: 0 10*3/uL (ref 0.0–0.5)
Eosinophils Relative: 1 %
HCT: 25.7 % — ABNORMAL LOW (ref 36.0–46.0)
Hemoglobin: 8.6 g/dL — ABNORMAL LOW (ref 12.0–15.0)
Immature Granulocytes: 1 %
Lymphocytes Relative: 44 %
Lymphs Abs: 0.5 10*3/uL — ABNORMAL LOW (ref 0.7–4.0)
MCH: 30.1 pg (ref 26.0–34.0)
MCHC: 33.5 g/dL (ref 30.0–36.0)
MCV: 89.9 fL (ref 80.0–100.0)
Monocytes Absolute: 0.1 10*3/uL (ref 0.1–1.0)
Monocytes Relative: 8 %
Neutro Abs: 0.5 10*3/uL — ABNORMAL LOW (ref 1.7–7.7)
Neutrophils Relative %: 45 %
Platelets: 172 10*3/uL (ref 150–400)
RBC: 2.86 MIL/uL — ABNORMAL LOW (ref 3.87–5.11)
RDW: 14.9 % (ref 11.5–15.5)
WBC: 1.1 10*3/uL — CL (ref 4.0–10.5)
nRBC: 0 % (ref 0.0–0.2)

## 2018-12-30 LAB — COMPREHENSIVE METABOLIC PANEL
ALT: 32 U/L (ref 0–44)
AST: 30 U/L (ref 15–41)
Albumin: 3.7 g/dL (ref 3.5–5.0)
Alkaline Phosphatase: 106 U/L (ref 38–126)
Anion gap: 10 (ref 5–15)
BUN: 18 mg/dL (ref 8–23)
CO2: 24 mmol/L (ref 22–32)
Calcium: 8.7 mg/dL — ABNORMAL LOW (ref 8.9–10.3)
Chloride: 99 mmol/L (ref 98–111)
Creatinine, Ser: 0.75 mg/dL (ref 0.44–1.00)
GFR calc Af Amer: >60 mL/min (ref >60)
GFR calc non Af Amer: >60 mL/min (ref >60)
Glucose, Bld: 112 mg/dL — ABNORMAL HIGH (ref 70–99)
Potassium: 3.9 mmol/L (ref 3.5–5.1)
Sodium: 133 mmol/L — ABNORMAL LOW (ref 135–145)
Total Bilirubin: 0.6 mg/dL (ref 0.3–1.2)
Total Protein: 6.5 g/dL (ref 6.5–8.1)

## 2018-12-30 LAB — LACTIC ACID, PLASMA: Lactic Acid, Venous: 0.7 mmol/L (ref 0.5–1.9)

## 2018-12-30 LAB — LIPASE, BLOOD: Lipase: 70 U/L — ABNORMAL HIGH (ref 11–51)

## 2018-12-30 LAB — SARS CORONAVIRUS 2 BY RT PCR (HOSPITAL ORDER, PERFORMED IN ~~LOC~~ HOSPITAL LAB): SARS Coronavirus 2: NEGATIVE

## 2018-12-30 MED ORDER — ACETAMINOPHEN 650 MG RE SUPP: 650.0000 mg | Freq: Four times a day (QID) | RECTAL | Status: DC | PRN

## 2018-12-30 MED ORDER — SODIUM CHLORIDE 0.9 % IV SOLN
2.0000 g | Freq: Three times a day (TID) | INTRAVENOUS | Status: DC
  Administered 2018-12-31 – 2019-01-03 (×10): 2 g via INTRAVENOUS
  Filled 2018-12-30 (×12): qty 2

## 2018-12-30 MED ORDER — MELATONIN 5 MG PO TABS
5.0000 mg | ORAL_TABLET | Freq: Every evening | ORAL | Status: DC | PRN
  Administered 2018-12-30: 5 mg via ORAL
  Filled 2018-12-30 (×2): qty 1

## 2018-12-30 MED ORDER — SODIUM CHLORIDE 0.9 % IV SOLN
2.0000 g | Freq: Once | INTRAVENOUS | Status: AC
  Administered 2018-12-30: 2 g via INTRAVENOUS
  Filled 2018-12-30: qty 2

## 2018-12-30 MED ORDER — ENOXAPARIN SODIUM 40 MG/0.4ML ~~LOC~~ SOLN
40.0000 mg | Freq: Every day | SUBCUTANEOUS | Status: DC
  Administered 2018-12-30 – 2019-01-02 (×4): 40 mg via SUBCUTANEOUS
  Filled 2018-12-30 (×5): qty 0.4

## 2018-12-30 MED ORDER — ONDANSETRON HCL 4 MG PO TABS: 4.0000 mg | ORAL_TABLET | Freq: Four times a day (QID) | ORAL | Status: DC | PRN

## 2018-12-30 MED ORDER — SODIUM CHLORIDE 0.9 % IV SOLN
INTRAVENOUS | Status: DC
  Administered 2018-12-30: 18:00:00 via INTRAVENOUS

## 2018-12-30 MED ORDER — ACETAMINOPHEN 500 MG PO TABS
1000.0000 mg | ORAL_TABLET | Freq: Four times a day (QID) | ORAL | Status: DC | PRN
  Administered 2018-12-31: 500 mg via ORAL
  Administered 2019-01-03: 1000 mg via ORAL
  Filled 2018-12-30 (×2): qty 2

## 2018-12-30 MED ORDER — SODIUM CHLORIDE 0.9 % IV SOLN
INTRAVENOUS | Status: DC
  Administered 2018-12-30: via INTRAVENOUS

## 2018-12-30 MED ORDER — ONDANSETRON HCL 4 MG/2ML IJ SOLN: 4.0000 mg | Freq: Four times a day (QID) | INTRAMUSCULAR | Status: DC | PRN

## 2018-12-30 MED ORDER — SODIUM CHLORIDE 0.9 % IV BOLUS
500.0000 mL | Freq: Once | INTRAVENOUS | Status: AC
  Administered 2018-12-30: 18:00:00 500 mL via INTRAVENOUS

## 2018-12-30 MED ORDER — TRAMADOL HCL 50 MG PO TABS: 50.0000 mg | ORAL_TABLET | Freq: Four times a day (QID) | ORAL | Status: DC | PRN

## 2018-12-30 MED ORDER — ACETAMINOPHEN 325 MG PO TABS: 650.0000 mg | ORAL_TABLET | Freq: Four times a day (QID) | ORAL | Status: DC | PRN

## 2018-12-30 NOTE — ED Provider Notes (Addendum)
[facility] DEPT Provider Note   CSN: [id number] Arrival date & time: 12/30/18  1542    History   Chief Complaint No chief complaint on file.   HPI Zoe Kim is a 73 y.o. female.     Patient is followed by Dr. Simeon Craft such for endometrial cancer.  Patient currently undergoing chemo.  Last treatment course was Tuesday.  Last seen by hematology oncology on July 21.  Patient started with fever fever documented at home yesterday.  No fever documented here today.  Patient without any upper respiratory symptoms.  No significant abdominal pain.  Does have some lower extremity body aches but that is been common with the chemo.  Patient states she feels better here after receiving fluids.  Patient has had some loose bowel movements.  No nausea or vomiting.  Patient does have a Port-A-Cath in her right upper chest area.     Past Medical History:  Diagnosis Date  . Anxiety   . Arthritis   . Depression   . Endometrial ca ([place])   . Family history of breast cancer   . Family history of lung cancer   . Heart murmur    "years ago"  . Insomnia   . Pneumonia    "long time ago"  . Primary localized osteoarthrosis of the knee, right   . Urinary urgency     Patient Active Problem List   Diagnosis Date Noted  . Anemia due to antineoplastic chemotherapy 12/24/2018  . Mild depression ([place]) 12/03/2018  . Other constipation 11/20/2018  . Peripheral neuropathy due to chemotherapy ([place]) 11/20/2018  . Family history of breast cancer   . Family history of lung cancer   . Bone pain 11/17/2018  . Dysuria 10/22/2018  . Obesity 10/14/2018  . Endometrial cancer ([place]) 10/01/2018  . Primary localized osteoarthritis of right knee 07/30/2016  . Anxiety   . Primary localized osteoarthrosis of the knee, right   . Urinary urgency     Past Surgical History:  Procedure Laterality Date  . COLONOSCOPY    . EYE SURGERY Bilateral    cataract with lens  . IR  IMAGING GUIDED PORT INSERTION  11/06/2018  . ROBOTIC ASSISTED TOTAL HYSTERECTOMY WITH BILATERAL SALPINGO OOPHERECTOMY N/A 10/14/2018   Procedure: ROBOTIC ASSISTED TOTAL HYSTERECTOMY WITH BILATERAL SALPINGO OOPHORECTOMY;  Surgeon: Everitt Amber, MD;  Location: [facility];  Service: Gynecology;  Laterality: N/A;  . SENTINEL NODE BIOPSY N/A 10/14/2018   Procedure: SENTINEL LYMPH NODE BIOPSY;  Surgeon: Everitt Amber, MD;  Location: [facility];  Service: Gynecology;  Laterality: N/A;  . TONSILLECTOMY    . TOOTH EXTRACTION Right 08/27/2017  . TOTAL KNEE ARTHROPLASTY Right 07/30/2016   Procedure: TOTAL KNEE ARTHROPLASTY;  Surgeon: Elsie Saas, MD;  Location: [facility];  Service: Orthopedics;  Laterality: Right;  . VEIN LIGATION AND STRIPPING       OB History    Gravida  1   Para  0   Term      Preterm      AB  1   Living  0     SAB      TAB      Ectopic      Multiple      Live Births               Home Medications    Prior to Admission medications   Medication Sig Start Date End Date Taking? Authorizing Provider  acetaminophen (TYLENOL) 500 MG tablet Take 1,000  mg by mouth every 6 (six) hours as needed for moderate pain.    [provider]  dexamethasone (DECADRON) 4 MG tablet Take 2 tabs at the night before and 2 tab the morning of chemotherapy, every 3 weeks, by mouth Patient not taking: Reported on 10/23/2018 10/22/18   Heath Lark, MD  ibuprofen (ADVIL) 600 MG tablet Take 1 tablet (600 mg total) by mouth every 6 (six) hours as needed for moderate pain. For AFTER surgery 10/01/18   [name] D, NP  lidocaine-prilocaine (EMLA) cream Apply to affected area once Patient not taking: Reported on 10/23/2018 10/22/18   Heath Lark, MD  MELATONIN PO Take 1 tablet by mouth at bedtime as needed (sleep).    [provider]  mirtazapine (REMERON) 15 MG tablet Take 1 tablet (15 mg total) by mouth at bedtime. 12/03/18   Heath Lark, MD  ondansetron (ZOFRAN) 8 MG tablet Take 1 tablet  (8 mg total) by mouth every 8 (eight) hours as needed. Start on the third day after chemotherapy. Patient not taking: Reported on 10/23/2018 10/22/18   Heath Lark, MD  oxybutynin (DITROPAN) 5 MG tablet Take 0.5-1 tablets (2.5-5 mg total) by mouth every 8 (eight) hours as needed for bladder spasms. Patient taking differently: Take 5 mg by mouth at bedtime.  06/11/18   [name], MD  prochlorperazine (COMPAZINE) 10 MG tablet Take 1 tablet (10 mg total) by mouth every 6 (six) hours as needed (Nausea or vomiting). Patient not taking: Reported on 10/23/2018 10/22/18   Heath Lark, MD  traMADol (ULTRAM) 50 MG tablet Take 1 tablet (50 mg total) by mouth every 6 (six) hours as needed. 11/17/18   Heath Lark, MD    Family History Family History  Problem Relation Age of Onset  . Heart disease Mother   . COPD Mother   . Cancer Mother        uterine (possibly, pt unsure)  . Breast cancer Mother        dx in 23s  . Heart disease Father   . COPD Brother   . Heart disease Brother   . Cancer Brother 33       lung ca, smoker  . Cancer Maternal Grandmother        breast dx early 39s  . Stroke Maternal Grandfather     Social History Social History   Tobacco Use  . Smoking status: Never Smoker  . Smokeless tobacco: Never Used  . Tobacco comment: smoked 2 weeks in college  Substance Use Topics  . Alcohol use: No  . Drug use: No     Allergies   Sulfa antibiotics   Review of Systems Review of Systems  Constitutional: Positive for fever. Negative for chills.  HENT: Negative for congestion, rhinorrhea and sore throat.   Eyes: Negative for visual disturbance.  Respiratory: Negative for cough and shortness of breath.   Cardiovascular: Negative for chest pain and leg swelling.  Gastrointestinal: Negative for abdominal pain, diarrhea, nausea and vomiting.  Genitourinary: Negative for dysuria.  Musculoskeletal: Positive for myalgias. Negative for back pain and neck pain.  Skin: Negative  for rash.  Neurological: Negative for dizziness, light-headedness and headaches.  Hematological: Does not bruise/bleed easily.  Psychiatric/Behavioral: Negative for confusion.     Physical Exam Updated Vital Signs BP 124/77   Pulse 100   Temp 98.6 F (37 C) (Oral)   Resp (!) 23   Ht 1.626 m (5\' 4" )   Wt 81.6 kg   SpO2  97%   BMI 30.90 kg/m   Physical Exam Vitals signs and nursing note reviewed.  Constitutional:      General: She is not in acute distress.    Appearance: Normal appearance. She is well-developed.  HENT:     Head: Normocephalic and atraumatic.  Eyes:     Extraocular Movements: Extraocular movements intact.     Conjunctiva/sclera: Conjunctivae normal.     Pupils: Pupils are equal, round, and reactive to light.  Neck:     Musculoskeletal: Normal range of motion and neck supple.  Cardiovascular:     Rate and Rhythm: Normal rate and regular rhythm.     Heart sounds: No murmur.  Pulmonary:     Effort: Pulmonary effort is normal. No respiratory distress.     Breath sounds: Normal breath sounds.     Comments: Port-A-Cath right upper chest skin the area without any erythema no discharge. Abdominal:     General: Bowel sounds are normal.     Palpations: Abdomen is soft.     Tenderness: There is no abdominal tenderness.  Musculoskeletal:        General: No swelling.  Skin:    General: Skin is warm and dry.     Capillary Refill: Capillary refill takes less than 2 seconds.  Neurological:     General: No focal deficit present.     Mental Status: She is alert and oriented to person, place, and time.     Cranial Nerves: No cranial nerve deficit.     Motor: No weakness.      ED Treatments / Results  Labs (all labs ordered are listed, but only abnormal results are displayed) Labs Reviewed  CBC WITH DIFFERENTIAL/PLATELET - Abnormal; Notable for the following components:      Result Value   WBC 1.1 (*)    RBC 2.86 (*)    Hemoglobin 8.6 (*)    HCT 25.7 (*)     Neutro Abs 0.5 (*)    Lymphs Abs 0.5 (*)    All other components within normal limits  COMPREHENSIVE METABOLIC PANEL - Abnormal; Notable for the following components:   Sodium 133 (*)    Glucose, Bld 112 (*)    Calcium 8.7 (*)    All other components within normal limits  LIPASE, BLOOD - Abnormal; Notable for the following components:   Lipase 70 (*)    All other components within normal limits  SARS CORONAVIRUS 2 (HOSPITAL ORDER, [facility])  CULTURE, BLOOD (ROUTINE X 2)  CULTURE, BLOOD (ROUTINE X 2)  URINE CULTURE  LACTIC ACID, PLASMA  URINALYSIS, ROUTINE W REFLEX MICROSCOPIC    EKG EKG Interpretation  Date/Time:  Tuesday December 30 2018 16:12:40 EDT Ventricular Rate:  115 PR Interval:    QRS Duration: 77 QT Interval:  309 QTC Calculation: 428 R Axis:   -72 Text Interpretation:  Sinus tachycardia Abnormal R-wave progression, late transition Inferior infarct, old Confirmed by Fredia Sorrow [phone]) on 12/30/2018 5:34:59 PM   Radiology Dg Chest Port 1 View  Result Date: 12/30/2018 CLINICAL DATA:  Per EMS: 73 y.o female hx of cervical cancer. Pt c/o of feeling "off" and different than normal compared to other chemo treatments since last chemo treatment last Tuesday. Pt c/o of tingling in hands and feet, weakness, loss of appetite, diarrhea, trouble sleeping, and fevers (100-101 degrees). EXAM: PORTABLE CHEST 1 VIEW COMPARISON:  07/05/2016 FINDINGS: Cardiac silhouette is normal size. No mediastinal or hilar masses. No evidence of adenopathy. Clear  lungs. No pleural effusion or pneumothorax. Right anterior chest wall, internal jugular central venous catheter has its tip in the lower superior vena cava. Skeletal structures are grossly intact. IMPRESSION: No acute cardiopulmonary disease. Electronically Signed   By: Lajean Manes M.D.   On: 12/30/2018 17:44    Procedures Procedures (including critical care time)  CRITICAL CARE Performed by: Fredia Sorrow Total critical care time: 30 minutes Critical care time was exclusive of separately billable procedures and treating other patients. Critical care was necessary to treat or prevent imminent or life-threatening deterioration. Critical care was time spent personally by me on the following activities: development of treatment plan with patient and/or surrogate as well as nursing, discussions with consultants, evaluation of patient's response to treatment, examination of patient, obtaining history from patient or surrogate, ordering and performing treatments and interventions, ordering and review of laboratory studies, ordering and review of radiographic studies, pulse oximetry and re-evaluation of patient's condition.   Medications Ordered in ED Medications  0.9 %  sodium chloride infusion ( Intravenous New Bag/Given 12/30/18 1806)  ceFEPIme (MAXIPIME) 2 g in sodium chloride 0.9 % 100 mL IVPB (has no administration in time range)  sodium chloride 0.9 % bolus 500 mL (0 mLs Intravenous Stopped 12/30/18 1914)     Initial Impression / Assessment and Plan / ED Course  I have reviewed the triage vital signs and the nursing notes.  Pertinent labs & imaging results that were available during my care of the patient were reviewed by me and considered in my medical decision making (see chart for details).        Discussed with Dr. Annamaria Boots on call for hematology oncology.  Recommends admission for neutropenic fever.  Patient nontoxic no acute distress.  Not hypotensive.  Lactic acid not elevated.  Coronavirus test negative.  Patient also with absolute neutrophil count of just 0.5.  Platelets are good hemoglobin down a little bit.  Blood cultures have been done urine and urine culture pending.  Patient started on Maxipime.  Will discuss with hospitalist for admission.  Patient's heart rate was initially 115.  Sinus tach.  After fluids heart rate is now down into the 90s.  No documented fever here  today.  Patient feeling better after the IV fluids.  Chest x-ray without any acute findings.  Lipase was slightly elevated.  The patient without any significant abdominal pain.  Final Clinical Impressions(s) / ED Diagnoses   Final diagnoses:  Neutropenic fever [facility])    ED Discharge Orders    None       Fredia Sorrow, MD 12/30/18 2119    Fredia Sorrow, MD 12/30/18 2120

## 2018-12-30 NOTE — ED Triage Notes (Signed)
Per EMS: Pt hx of cervical cancer.  Pt c/o of feeling "off" and different than normal compared to other chemo treatments since last chemo treatment last Tuesday.  Pt c/o of tingling in hands and feet, weakness, loss of appetite, diarrhea, trouble sleeping, and fevers (100-101 degrees).

## 2018-12-30 NOTE — H&P (Signed)
History and Physical    Zoe Kim TZG:[id number] DOB: 03/02/1946 DOA: 12/30/2018  PCP: [name], MD  Patient coming from: Home  I have personally briefly reviewed patient's old medical records in [place]  Chief Complaint: Fevers, malaise, poor appetite  HPI: Zoe Kim is a 73 y.o. female with medical history significant for endometrial cancer undergoing chemotherapy with carboplatin and Taxol, depression/anxiety who presents to the ED for evaluation of intermittent fevers, generalized weakness, and worsening numbness/tingling in her lower legs progressive since her last chemotherapy treatment on 12/23/2018.  Patient is currently undergoing chemotherapy with carboplatin and Taxol.  She states normally afterwards she has some numbness/tingling in her fingertips and in her legs below her knees.  After this treatment she has been having new onset intermittent fevers with highest up to 100.8 Fahrenheit that she recorded at home on 12/29/2018.  She has been waking up in the middle night with chills but denies any diaphoresis.  She denies any associated chest pain, dyspnea, cough, abdominal pain, or constipation.  She reports having loose semisoft stools for several days now.  She states her urine output is decreased compared to what she is used to but denies any dysuria.   ED Course:  Initial vitals showed BP 115/69, pulse 108, RR 15, temp 98.6 Fahrenheit, SPO2 96% on room air.  Labs notable for WBC 1.1 45% neutrophils, 44% lymphocytes, hemoglobin 8.6, platelets 172,000, lipase 70, lactic acid 0.7.  Blood cultures were collected and pending.  Urine studies are pending collection.  SARS-CoV-2 test is negative.  Portable chest x-ray showed internal jugular central venous catheter in the right chest wall in place with tip in the lower superior vena cava without focal consolidation, edema, or effusion.  EDP discussed the case with oncology, Dr. Burr Medico, who  recommended admission with empiric antibiotics.  Patient was given 500 mLs normal saline and started on IV cefepime.  The hospital service was consulted to admit for further evaluation management.   Review of Systems: All systems reviewed and are negative except as documented in history of present illness above.   Past Medical History:  Diagnosis Date  . Anxiety   . Arthritis   . Depression   . Endometrial ca ([place])   . Family history of breast cancer   . Family history of lung cancer   . Heart murmur    "years ago"  . Insomnia   . Pneumonia    "long time ago"  . Primary localized osteoarthrosis of the knee, right   . Urinary urgency     Past Surgical History:  Procedure Laterality Date  . COLONOSCOPY    . EYE SURGERY Bilateral    cataract with lens  . IR IMAGING GUIDED PORT INSERTION  11/06/2018  . ROBOTIC ASSISTED TOTAL HYSTERECTOMY WITH BILATERAL SALPINGO OOPHERECTOMY N/A 10/14/2018   Procedure: ROBOTIC ASSISTED TOTAL HYSTERECTOMY WITH BILATERAL SALPINGO OOPHORECTOMY;  Surgeon: Everitt Amber, MD;  Location: [facility];  Service: Gynecology;  Laterality: N/A;  . SENTINEL NODE BIOPSY N/A 10/14/2018   Procedure: SENTINEL LYMPH NODE BIOPSY;  Surgeon: Everitt Amber, MD;  Location: [facility];  Service: Gynecology;  Laterality: N/A;  . TONSILLECTOMY    . TOOTH EXTRACTION Right 08/27/2017  . TOTAL KNEE ARTHROPLASTY Right 07/30/2016   Procedure: TOTAL KNEE ARTHROPLASTY;  Surgeon: Elsie Saas, MD;  Location: [facility];  Service: Orthopedics;  Laterality: Right;  . VEIN LIGATION AND STRIPPING      Social History:  reports that she has never  smoked. She has never used smokeless tobacco. She reports that she does not drink alcohol or use drugs.  Allergies  Allergen Reactions  . Sulfa Antibiotics     UNSPECIFIED REACTION     Family History  Problem Relation Age of Onset  . Heart disease Mother   . COPD Mother   . Cancer Mother        uterine (possibly, pt unsure)  . Breast cancer Mother         dx in 74s  . Heart disease Father   . COPD Brother   . Heart disease Brother   . Cancer Brother 17       lung ca, smoker  . Cancer Maternal Grandmother        breast dx early 55s  . Stroke Maternal Grandfather      Prior to Admission medications   Medication Sig Start Date End Date Taking? Authorizing Provider  acetaminophen (TYLENOL) 500 MG tablet Take 1,000 mg by mouth every 6 (six) hours as needed for moderate pain.   Yes [provider]  MELATONIN PO Take 1 tablet by mouth at bedtime as needed (sleep).   Yes [provider]  dexamethasone (DECADRON) 4 MG tablet Take 2 tabs at the night before and 2 tab the morning of chemotherapy, every 3 weeks, by mouth Patient not taking: Reported on 10/23/2018 10/22/18   Heath Lark, MD  ibuprofen (ADVIL) 600 MG tablet Take 1 tablet (600 mg total) by mouth every 6 (six) hours as needed for moderate pain. For AFTER surgery 10/01/18   [name] D, NP  lidocaine-prilocaine (EMLA) cream Apply to affected area once Patient not taking: Reported on 10/23/2018 10/22/18   Heath Lark, MD  mirtazapine (REMERON) 15 MG tablet Take 1 tablet (15 mg total) by mouth at bedtime. Patient not taking: Reported on 12/30/2018 12/03/18   Heath Lark, MD  ondansetron (ZOFRAN) 8 MG tablet Take 1 tablet (8 mg total) by mouth every 8 (eight) hours as needed. Start on the third day after chemotherapy. Patient not taking: Reported on 10/23/2018 10/22/18   Heath Lark, MD  oxybutynin (DITROPAN) 5 MG tablet Take 0.5-1 tablets (2.5-5 mg total) by mouth every 8 (eight) hours as needed for bladder spasms. Patient not taking: Reported on 12/30/2018 06/11/18   [name], MD  prochlorperazine (COMPAZINE) 10 MG tablet Take 1 tablet (10 mg total) by mouth every 6 (six) hours as needed (Nausea or vomiting). Patient not taking: Reported on 10/23/2018 10/22/18   Heath Lark, MD  traMADol (ULTRAM) 50 MG tablet Take 1 tablet (50 mg total) by mouth every 6 (six) hours as  needed. Patient not taking: Reported on 12/30/2018 11/17/18   Heath Lark, MD    Physical Exam: Vitals:   12/30/18 2115 12/30/18 2145 12/30/18 2200 12/30/18 2245  BP: 122/77  126/73 138/82  Pulse: 96 98 (!) 101 98  Resp: 20 19 19 15   Temp:      TempSrc:      SpO2: 100% 99% 99% 100%  Weight:      Height:        Constitutional: Resting supine in bed, NAD, calm, comfortable Eyes: PERRL, lids and conjunctivae normal ENMT: Mucous membranes are moist. Posterior pharynx clear of any exudate or lesions.Normal dentition.  Neck: normal, supple, no masses. Respiratory: clear to auscultation bilaterally, no wheezing, no crackles. Normal respiratory effort. No accessory muscle use.  Cardiovascular: Regular rate and rhythm, no murmurs / rubs / gallops. No extremity edema. 2+  pedal pulses.  Port-A-Cath right chest wall. Abdomen: no tenderness, no masses palpated. No hepatosplenomegaly. Bowel sounds positive.  Musculoskeletal: no clubbing / cyanosis. No joint deformity upper and lower extremities. Good ROM, no contractures. Normal muscle tone.  Skin: no rashes, lesions, ulcers. No induration Neurologic: CN 2-12 grossly intact. Sensation intact, Strength 5/5 in all 4.  Psychiatric: Normal judgment and insight. Alert and oriented x 3. Normal mood.     Labs on Admission: I have personally reviewed following labs and imaging studies  CBC: Recent Labs  Lab 12/30/18 1805  WBC 1.1*  NEUTROABS 0.5*  HGB 8.6*  HCT 25.7*  MCV 89.9  PLT 397   Basic Metabolic Panel: Recent Labs  Lab 12/30/18 1805  NA 133*  K 3.9  CL 99  CO2 24  GLUCOSE 112*  BUN 18  CREATININE 0.75  CALCIUM 8.7*   GFR: Estimated Creatinine Clearance: 63.8 mL/min (by C-G formula based on SCr of 0.75 mg/dL). Liver Function Tests: Recent Labs  Lab 12/30/18 1805  AST 30  ALT 32  ALKPHOS 106  BILITOT 0.6  PROT 6.5  ALBUMIN 3.7   Recent Labs  Lab 12/30/18 1805  LIPASE 70*   No results for input(s): AMMONIA in  the last 168 hours. Coagulation Profile: No results for input(s): INR, PROTIME in the last 168 hours. Cardiac Enzymes: No results for input(s): CKTOTAL, CKMB, CKMBINDEX, TROPONINI in the last 168 hours. BNP (last 3 results) No results for input(s): PROBNP in the last 8760 hours. HbA1C: No results for input(s): HGBA1C in the last 72 hours. CBG: No results for input(s): GLUCAP in the last 168 hours. Lipid Profile: No results for input(s): CHOL, HDL, LDLCALC, TRIG, CHOLHDL, LDLDIRECT in the last 72 hours. Thyroid Function Tests: No results for input(s): TSH, T4TOTAL, FREET4, T3FREE, THYROIDAB in the last 72 hours. Anemia Panel: No results for input(s): VITAMINB12, FOLATE, FERRITIN, TIBC, IRON, RETICCTPCT in the last 72 hours. Urine analysis:    Component Value Date/Time   COLORURINE STRAW (A) 10/22/2018 1138   APPEARANCEUR CLEAR 10/22/2018 1138   LABSPEC 1.004 (L) 10/22/2018 1138   PHURINE 6.0 10/22/2018 1138   GLUCOSEU NEGATIVE 10/22/2018 1138   HGBUR SMALL (A) 10/22/2018 1138   BILIRUBINUR NEGATIVE 10/22/2018 1138   BILIRUBINUR negative 09/10/2018 1355   BILIRUBINUR Small 10/18/2014 1619   KETONESUR NEGATIVE 10/22/2018 1138   PROTEINUR NEGATIVE 10/22/2018 1138   UROBILINOGEN 0.2 09/10/2018 1355   NITRITE NEGATIVE 10/22/2018 1138   LEUKOCYTESUR MODERATE (A) 10/22/2018 1138    Radiological Exams on Admission: Dg Chest Port 1 View  Result Date: 12/30/2018 CLINICAL DATA:  Per EMS: 73 y.o female hx of cervical cancer. Pt c/o of feeling "off" and different than normal compared to other chemo treatments since last chemo treatment last Tuesday. Pt c/o of tingling in hands and feet, weakness, loss of appetite, diarrhea, trouble sleeping, and fevers (100-101 degrees). EXAM: PORTABLE CHEST 1 VIEW COMPARISON:  07/05/2016 FINDINGS: Cardiac silhouette is normal size. No mediastinal or hilar masses. No evidence of adenopathy. Clear lungs. No pleural effusion or pneumothorax. Right anterior  chest wall, internal jugular central venous catheter has its tip in the lower superior vena cava. Skeletal structures are grossly intact. IMPRESSION: No acute cardiopulmonary disease. Electronically Signed   By: Lajean Manes M.D.   On: 12/30/2018 17:44    EKG: Independently reviewed. Sinus tachycardia, late R wave transition, low voltage in leads V1 and V2.  Assessment/Plan Principal Problem:   Neutropenic fever (HCC) Active Problems:   Endometrial  cancer ([place])   Anemia due to antineoplastic chemotherapy  Zoe Kim is a 73 y.o. female with medical history significant for endometrial cancer undergoing chemotherapy with carboplatin and Taxol, depression/anxiety who is admitted for neutropenic fever.   Neutropenic fever: No obvious infectious source.  Chest x-ray is clear, blood cultures are pending.  Urinalysis collection is pending, however patient denies any dysuria.  Patient is started on empiric IV cefepime. -Continue IV cefepime -Follow-up blood cultures and urine study  Endometrial cancer:  Follows with Dr. Alvy Bimler currently undergoing chemotherapy with Taxol and carboplatin, last infusion 12/23/2018.  She is also planned to see radiation oncology for further therapy however was unable to keep her appointment due to presenting to the ED this admission.  Anemia due to chemotherapy: Hemoglobin decreased to 8.6 compared to 10.51-week prior.  There is no obvious bleeding.  Likely secondary to chemotherapy.  Continue to monitor.   DVT prophylaxis: Lovenox Code Status: Full code, confirmed with patient Family Communication: Discussed with patient, she has communicated with her husband Disposition Plan: Pending clinical progress Consults called: EDP discussed with oncology by phone Admission status: Inpatient for further evaluation and management of neutropenic fever requiring IV antibiotics and awaiting culture data.   Zada Finders MD [facility]  If 7PM-7AM,  please contact night-coverage [email or web address]  12/30/2018, 11:11 PM

## 2018-12-30 NOTE — ED Notes (Addendum)
Pt requested to be transported to restroom in wheelchair

## 2018-12-30 NOTE — Telephone Encounter (Signed)
CALLED PATIENT TO INFORM OF HDR [place], SPOKE WITH PATIENT, BUT SHE DECLINED TO TALK TO ME @ THIS TIME, PATIENT IS ON HER WAY TO [place], I TOLD HER THAT I WILL HER TOMORROW

## 2018-12-30 NOTE — Telephone Encounter (Signed)
Returned patient call. Her fever has been on and off since Friday. The weakness is in her legs bilaterally. No SOB or chest pains. She reports she had stabbing pains through out the night running up her legs. She has not been able to get her fever below 100.9 with tylenol. She has been able to eat and drink, denies nausea and vomiting. Patient has been advised to report to ED at [facility]. Patient has her husband with her who confirms he will take her.

## 2018-12-30 NOTE — Telephone Encounter (Signed)
Received call from patient. She states that she is not feeling well, cancelled her appts with Rad. Onc this morning due to fever and increased weakness. She states her temperature ranges from 100-100.8, even with Tylenol.  She states she feels very weak, can't walk more than 5 steps, and did not feel like she could make it to appts in [facility] today. These symptoms have been progressing over the weekend and Monday/ Tuesday. Denies nausea/vomiting. States her stools are a little loose but not watery.  Eating fair, taking po fluids well.  She has had 4 water bottles full of water so far today.  Denies cough, dysuria.  She would like to know what to do at this point.  Please advise

## 2018-12-30 NOTE — ED Notes (Signed)
Pt taken to RR in wheelchair.

## 2018-12-30 NOTE — Telephone Encounter (Signed)
Returned pt's VM. Left VM encouraging pt to return call to this RN. This RN's number left.   Pt had left VM canceling today's new [place] HDR due to fever and malaise, attributed to chemotherapy. Pt stated temp 100.8.  Loma Sousa, RN BSN

## 2018-12-30 NOTE — ED Notes (Signed)
Critical value:  WBC-1.1

## 2018-12-31 ENCOUNTER — Ambulatory Visit: Payer: Self-pay | Admitting: Radiation Oncology

## 2018-12-31 DIAGNOSIS — C541 Malignant neoplasm of endometrium: Secondary | ICD-10-CM

## 2018-12-31 LAB — BASIC METABOLIC PANEL
Anion gap: 7 (ref 5–15)
BUN: 16 mg/dL (ref 8–23)
CO2: 24 mmol/L (ref 22–32)
Calcium: 8.4 mg/dL — ABNORMAL LOW (ref 8.9–10.3)
Chloride: 106 mmol/L (ref 98–111)
Creatinine, Ser: 0.68 mg/dL (ref 0.44–1.00)
GFR calc Af Amer: >60 mL/min (ref >60)
GFR calc non Af Amer: >60 mL/min (ref >60)
Glucose, Bld: 94 mg/dL (ref 70–99)
Potassium: 3.6 mmol/L (ref 3.5–5.1)
Sodium: 137 mmol/L (ref 135–145)

## 2018-12-31 LAB — CBC
HCT: 23.4 % — ABNORMAL LOW (ref 36.0–46.0)
Hemoglobin: 7.9 g/dL — ABNORMAL LOW (ref 12.0–15.0)
MCH: 30.7 pg (ref 26.0–34.0)
MCHC: 33.8 g/dL (ref 30.0–36.0)
MCV: 91.1 fL (ref 80.0–100.0)
Platelets: 130 10*3/uL — ABNORMAL LOW (ref 150–400)
RBC: 2.57 MIL/uL — ABNORMAL LOW (ref 3.87–5.11)
RDW: 15.1 % (ref 11.5–15.5)
WBC: 1.3 10*3/uL — CL (ref 4.0–10.5)
nRBC: 0 % (ref 0.0–0.2)

## 2018-12-31 LAB — BLOOD CULTURE ID PANEL (REFLEXED)

## 2018-12-31 LAB — URINALYSIS, ROUTINE W REFLEX MICROSCOPIC
Bacteria, UA: NONE SEEN
Bilirubin Urine: NEGATIVE
Glucose, UA: NEGATIVE mg/dL
Ketones, ur: NEGATIVE mg/dL
Nitrite: POSITIVE — AB
Protein, ur: NEGATIVE mg/dL
Specific Gravity, Urine: 1.009 (ref 1.005–1.030)
WBC, UA: >50 WBC/hpf — ABNORMAL HIGH (ref 0–5)
pH: 5 (ref 5.0–8.0)

## 2018-12-31 MED ORDER — TBO-FILGRASTIM 300 MCG/0.5ML ~~LOC~~ SOSY
300.0000 ug | PREFILLED_SYRINGE | Freq: Every day | SUBCUTANEOUS | Status: DC
  Administered 2018-12-31 – 2019-01-02 (×3): 300 ug via SUBCUTANEOUS
  Filled 2018-12-31 (×5): qty 0.5

## 2018-12-31 MED ORDER — SODIUM CHLORIDE 0.9 % IV SOLN
INTRAVENOUS | Status: DC | PRN
  Administered 2018-12-31: 250 mL via INTRAVENOUS

## 2018-12-31 NOTE — ED Notes (Signed)
Pt husband at bedside

## 2018-12-31 NOTE — ED Notes (Signed)
Took pt to restroom in a wheelchair and obtained urine sample. Pt bed switched to a floor bed for comfort. Pt's clothing and shoes placed in a pt belonging bag and left at the head of her bed and her purse was left beside her on the bed.  Pt given warm blankets and hospital socks placed on her feet.  Lights turned off and pt had no requests prior to leaving.  Pt resting comfortably.

## 2018-12-31 NOTE — Progress Notes (Signed)
Pharmacy Antibiotic Note  Zoe Kim is a 73 y.o. female admitted on 12/30/2018 with sepsis, neutropenic fever.  Pharmacy has been consulted for cefepime dosing.  Plan: Cefepime 2gm iv q8hr  Height: 5\' 4"  (162.6 cm) Weight: 180 lb (81.6 kg) IBW/kg (Calculated) : 54.7  Temp (24hrs), Avg:98.6 F (37 C), Min:98.6 F (37 C), Max:98.6 F (37 C)  Recent Labs  Lab 12/30/18 1805  WBC 1.1*  CREATININE 0.75  LATICACIDVEN 0.7    Estimated Creatinine Clearance: 63.8 mL/min (by C-G formula based on SCr of 0.75 mg/dL).    Allergies  Allergen Reactions  . Sulfa Antibiotics     UNSPECIFIED REACTION     Antimicrobials this admission: Cefepime 12/30/2018 >>   Dose adjustments this admission: -  Microbiology results: -  Thank you for allowing pharmacy to be a part of this patient's care.  Nani Skillern Crowford 12/31/2018 5:21 AM

## 2018-12-31 NOTE — Progress Notes (Signed)
PROGRESS NOTE    Zoe Kim  PYP:[id number] DOB: 1945-12-12 DOA: 12/30/2018 PCP: [name], MD     Brief Narrative:  Zoe Kim is a 73 y.o. female with medical history significant for endometrial cancer undergoing chemotherapy with carboplatin and Taxol, depression/anxiety who presents to the ED for evaluation of intermittent fevers, generalized weakness, and worsening numbness/tingling in her lower legs progressive since her last chemotherapy treatment on 12/23/2018.  Patient is currently undergoing chemotherapy with carboplatin and Taxol.  She states normally afterwards she has some numbness/tingling in her fingertips and in her legs below her knees.  After this treatment she has been having new onset intermittent fevers with highest up to 100.8 Fahrenheit that she recorded at home on 12/29/2018.  She has been waking up in the middle night with chills but denies any diaphoresis.  She denies any associated chest pain, dyspnea, cough, abdominal pain, or constipation.  She reports having loose semisoft stools for several days now.  She states her urine output is decreased compared to what she is used to but denies any dysuria.  Patient was started on empiric antibiotics and admitted for neutropenic fever.  New events last 24 hours / Subjective: No new complaints today.  Assessment & Plan:   Principal Problem:   Neutropenic fever ([place]) Active Problems:   Endometrial cancer ([place])   Anemia due to antineoplastic chemotherapy   Neutropenic fever, could be urinary source -Chest x-ray negative for acute cardiopulmonary disease -COVID-19 negative -Blood cultures pending -UA showed moderate leukocytes, positive nitrite, >50 WBC.  Culture pending -Continue empiric IV cefepime and monitor fever curve, await urine culture result -Oncology added G-CSF daily until [place] greater than 1.5  Endometrial cancer -Follows with Dr. Alvy Bimler, currently undergoing chemotherapy   Pancytopenia secondary to chemotherapy -Continue to monitor       DVT prophylaxis: Lovenox Code Status: Full code Family Communication: None Disposition Plan: Further improvement   Consultants:   Oncology  Procedures:   None  Antimicrobials:  Anti-infectives (From admission, onward)   Start     Dose/Rate Route Frequency Ordered Stop   12/31/18 0600  ceFEPIme (MAXIPIME) 2 g in sodium chloride 0.9 % 100 mL IVPB     2 g 200 mL/hr over 30 Minutes Intravenous Every 8 hours 12/30/18 2305     12/30/18 2115  ceFEPIme (MAXIPIME) 2 g in sodium chloride 0.9 % 100 mL IVPB     2 g 200 mL/hr over 30 Minutes Intravenous  Once 12/30/18 2108 12/30/18 2231        Objective: Vitals:   12/31/18 0930 12/31/18 1000 12/31/18 1100 12/31/18 1130  BP: 124/63 128/74 129/61 (!) 134/123  Pulse: 92 92 87 99  Resp: 20 (!) 23 20 14   Temp:      TempSrc:      SpO2: 99% 100% 98% 98%  Weight:      Height:        Intake/Output Summary (Last 24 hours) at 12/31/2018 1245 Last data filed at 12/31/2018 2458 Gross per 24 hour  Intake 2098.1 ml  Output -  Net 2098.1 ml   Filed Weights   12/30/18 1616  Weight: 81.6 kg    Examination:  General exam: Appears calm and comfortable  Respiratory system: Clear to auscultation. Respiratory effort normal. Cardiovascular system: S1 & S2 heard, RRR. No JVD, murmurs, rubs, gallops or clicks. No pedal edema. Gastrointestinal system: Abdomen is nondistended, soft and nontender. No organomegaly or masses felt. Normal bowel sounds heard. Central nervous  system: Alert and oriented. No focal neurological deficits. Extremities: Symmetric 5 x 5 power. Skin: No rashes, lesions or ulcers Psychiatry: Judgement and insight appear normal. Mood & affect appropriate.   Data Reviewed: I have personally reviewed following labs and imaging studies  CBC: Recent Labs  Lab 12/30/18 1805 12/31/18 0634  WBC 1.1* 1.3*  NEUTROABS 0.5*  --   HGB 8.6* 7.9*  HCT 25.7*  23.4*  MCV 89.9 91.1  PLT 172 546*   Basic Metabolic Panel: Recent Labs  Lab 12/30/18 1805 12/31/18 0634  NA 133* 137  K 3.9 3.6  CL 99 106  CO2 24 24  GLUCOSE 112* 94  BUN 18 16  CREATININE 0.75 0.68  CALCIUM 8.7* 8.4*   GFR: Estimated Creatinine Clearance: 63.8 mL/min (by C-G formula based on SCr of 0.68 mg/dL). Liver Function Tests: Recent Labs  Lab 12/30/18 1805  AST 30  ALT 32  ALKPHOS 106  BILITOT 0.6  PROT 6.5  ALBUMIN 3.7   Recent Labs  Lab 12/30/18 1805  LIPASE 70*   No results for input(s): AMMONIA in the last 168 hours. Coagulation Profile: No results for input(s): INR, PROTIME in the last 168 hours. Cardiac Enzymes: No results for input(s): CKTOTAL, CKMB, CKMBINDEX, TROPONINI in the last 168 hours. BNP (last 3 results) No results for input(s): PROBNP in the last 8760 hours. HbA1C: No results for input(s): HGBA1C in the last 72 hours. CBG: No results for input(s): GLUCAP in the last 168 hours. Lipid Profile: No results for input(s): CHOL, HDL, LDLCALC, TRIG, CHOLHDL, LDLDIRECT in the last 72 hours. Thyroid Function Tests: No results for input(s): TSH, T4TOTAL, FREET4, T3FREE, THYROIDAB in the last 72 hours. Anemia Panel: No results for input(s): VITAMINB12, FOLATE, FERRITIN, TIBC, IRON, RETICCTPCT in the last 72 hours. Sepsis Labs: Recent Labs  Lab 12/30/18 1805  LATICACIDVEN 0.7    Recent Results (from the past 240 hour(s))  Culture, blood (Routine X 2) w Reflex to ID Panel     Status: None (Preliminary result)   Collection Time: 12/30/18  6:05 PM   Specimen: BLOOD  Result Value Ref Range Status   Specimen Description   Final    BLOOD RIGHT CHEST Performed at [facility] [address], [place], [place] [address]    Special Requests   Final    BOTTLES DRAWN AEROBIC AND ANAEROBIC Blood Culture adequate volume Performed at [facility] [address], [place], [place] [address]    Culture    Final    NO GROWTH < 12 HOURS Performed at [facility] [address], [place], [place] [address]    Report Status PENDING  Incomplete  Culture, blood (Routine X 2) w Reflex to ID Panel     Status: None (Preliminary result)   Collection Time: 12/30/18  6:05 PM   Specimen: BLOOD  Result Value Ref Range Status   Specimen Description   Final    BLOOD RIGHT ANTECUBITAL Performed at [facility] [address] [place], [place] [address]    Special Requests   Final    BOTTLES DRAWN AEROBIC AND ANAEROBIC Blood Culture results may not be optimal due to an excessive volume of blood received in culture bottles Performed at [facility] [address], [place], [place] [address]    Culture   Final    NO GROWTH < 12 HOURS Performed at [facility] [address], [place], [place] [address]  Report Status PENDING  Incomplete  SARS Coronavirus 2 (CEPHEID- Performed in [facility] hospital lab), Hosp Order     Status: None   Collection Time: 12/30/18  6:19 PM   Specimen: Nasopharyngeal Swab  Result Value Ref Range Status   SARS Coronavirus 2 NEGATIVE NEGATIVE Final    Comment: (NOTE) If result is NEGATIVE SARS-CoV-2 target nucleic acids are NOT DETECTED. The SARS-CoV-2 RNA is generally detectable in upper and lower  respiratory specimens during the acute phase of infection. The lowest  concentration of SARS-CoV-2 viral copies this assay can detect is 250  copies / mL. A negative result does not preclude SARS-CoV-2 infection  and should not be used as the sole basis for treatment or other  patient management decisions.  A negative result may occur with  improper specimen collection / handling, submission of specimen other  than nasopharyngeal swab, presence of viral mutation(s) within the  areas targeted by this assay, and inadequate number of viral copies  (<250 copies / mL). A negative result must be combined with clinical   observations, patient history, and epidemiological information. If result is POSITIVE SARS-CoV-2 target nucleic acids are DETECTED. The SARS-CoV-2 RNA is generally detectable in upper and lower  respiratory specimens dur ing the acute phase of infection.  Positive  results are indicative of active infection with SARS-CoV-2.  Clinical  correlation with patient history and other diagnostic information is  necessary to determine patient infection status.  Positive results do  not rule out bacterial infection or co-infection with other viruses. If result is PRESUMPTIVE POSTIVE SARS-CoV-2 nucleic acids MAY BE PRESENT.   A presumptive positive result was obtained on the submitted specimen  and confirmed on repeat testing.  While 2019 novel coronavirus  (SARS-CoV-2) nucleic acids may be present in the submitted sample  additional confirmatory testing may be necessary for epidemiological  and / or clinical management purposes  to differentiate between  SARS-CoV-2 and other Sarbecovirus currently known to infect humans.  If clinically indicated additional testing with an alternate test  methodology [phone]) is advised. The SARS-CoV-2 RNA is generally  detectable in upper and lower respiratory sp ecimens during the acute  phase of infection. The expected result is Negative. Fact Sheet for Patients:  StrictlyIdeas.no Fact Sheet for Healthcare Providers: BankingDealers.co.za This test is not yet approved or cleared by the [place] FDA and has been authorized for detection and/or diagnosis of SARS-CoV-2 by FDA under an Emergency Use Authorization (EUA).  This EUA will remain in effect (meaning this test can be used) for the duration of the COVID-19 declaration under Section 564(b)(1) of the Act, 21 U.S.C. section 360bbb-3(b)(1), unless the authorization is terminated or revoked sooner. Performed at [facility], [place]  [address] [place], [place] [address]   Urine Culture     Status: None (Preliminary result)   Collection Time: 12/30/18  9:03 PM   Specimen: Urine, Random  Result Value Ref Range Status   Specimen Description   Final    URINE, RANDOM Performed at [facility], [place] [address] [place], [place] [address]    Special Requests   Final    NONE Performed at [facility], [place] [address] [place], [place] [address]    Culture PENDING  Incomplete   Report Status PENDING  Incomplete      Radiology Studies: Dg Chest Port 1 View  Result Date: 12/30/2018 CLINICAL DATA:  Per EMS: 73 y.o female hx of cervical cancer. Pt  c/o of feeling "off" and different than normal compared to other chemo treatments since last chemo treatment last Tuesday. Pt c/o of tingling in hands and feet, weakness, loss of appetite, diarrhea, trouble sleeping, and fevers (100-101 degrees). EXAM: PORTABLE CHEST 1 VIEW COMPARISON:  07/05/2016 FINDINGS: Cardiac silhouette is normal size. No mediastinal or hilar masses. No evidence of adenopathy. Clear lungs. No pleural effusion or pneumothorax. Right anterior chest wall, internal jugular central venous catheter has its tip in the lower superior vena cava. Skeletal structures are grossly intact. IMPRESSION: No acute cardiopulmonary disease. Electronically Signed   By: Lajean Manes M.D.   On: 12/30/2018 17:44      Scheduled Meds: . enoxaparin (LOVENOX) injection  40 mg Subcutaneous QHS  . Tbo-filgastrim (GRANIX) SQ  300 mcg Subcutaneous Daily   Continuous Infusions: . ceFEPime (MAXIPIME) IV Stopped (12/31/18 4709)     LOS: 1 day      Time spent: 35 minutes   Dessa Phi, DO [facility] [email or web address] 12/31/2018, 12:45 PM

## 2018-12-31 NOTE — Progress Notes (Signed)
Zoe Kim   DOB:06-Oct-1945   [id number]    ASSESSMENT & PLAN:  Neutropenic fever So far, urinalysis is abnormal, cultures are pending. Chest x-ray looks normal She will continue broad-spectrum and IV antibiotics I will add G-CSF support daily until [facility] is greater than 1.5 I will follow  Endometrial cancer She had received chemotherapy recently Continue aggressive supportive care  Acquired pancytopenia Due to recent chemotherapy I will order type and screen for tomorrow There is a small chance that she might need blood transfusion support while hospitalized  Code Status Full code  Goals of care Until neutropenic fever resolves  Discharge planning She will likely be here for the next 2 to 3 days.  I will follow  All questions were answered. The patient knows to call the clinic with any problems, questions or concerns.   Heath Lark, MD 12/31/2018 8:38 AM  Subjective:  Patient well-known to me.  She is receiving chemotherapy for endometrial cancer in an adjuvant fashion.  Her last treatment was on December 22, 2018. The patient was directed to the emergency department due to fever.  She was found to be neutropenic.  Cultures were drawn and the patient was started on broad-spectrum IV antibiotics. She is still waiting to be placed in the regular room.  I saw her in the emergency department.  She has been afebrile the last 12 hours. The patient denies any recent signs or symptoms of bleeding such as spontaneous epistaxis, hematuria or hematochezia. She denies mucositis, cough, chest pain or shortness of breath.  Denies urinary frequency or urgency. She denies nausea.  No constipation. 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  The patient reports 6 months or maybe longer of intermittent vaginal spotting.  She was seen by Dr. [name] on September 19, 2018 who performed a Pap smear which revealed atypical glandular cells and an endocervical biopsy which revealed a benign endocervical polyp, and endometrial biopsy which revealed high-grade serous endometrial adenocarcinoma and adenocarcinoma FIGO grade 2.    10/03/2018 Imaging   CT abdomen and pelvis 1. Enhancing soft tissue density in endometrial cavity, consistent with history of endometrial carcinoma. No evidence of extra uterine tumor extension or metastatic disease. 2. Tiny less than 1 cm anterior uterine fibroid. 3. Small hiatal hernia.  Aortic Atherosclerosis (ICD10-I70.0).   10/14/2018 Pathology Results   1. Lymph node, sentinel, biopsy, right external iliac - METASTATIC CARCINOMA PRESENT IN (1) OF (1) LYMPH NODE. 2. Lymph node, sentinel, biopsy, right deep obturator - ONE LYMPH NODE, NEGATIVE FOR CARCINOMA (0/1). 3. Lymph node, sentinel, biopsy, left external iliac - ONE LYMPH NODE, NEGATIVE FOR CARCINOMA (0/1). 4. Uterus +/- tubes/ovaries, neoplastic, cervix, bilateral fallopian tubes and ovaries - MIXED CELL CARCINOMA OF ENDOMETRIUM (MIXED ENDOMETRIOID CARCINOMA AND SEROUS CARCINOMA), WITH INVASION LESS THAN HALF OF THE MYOMETRIUM. - NO INVOLVEMENT OF UTERINE SEROSA, CERVICAL STROMA OR ADNEXA. - SEE ONCOLOGY TABLE. Microscopic Comment 4. UTERUS, CARCINOMA OR CARCINOSARCOMA Procedure: Total hysterectomy and bilateral salpingo-oophorectomy Histologic type: Mixed cell carcinoma (Endometrioid  carcinoma 70% and Serous carcinoma  30%) Histologic Grade: Not applicable Myometrial Invasion: Present Depth of invasion: 6 mm Myometrial thickness: 17 mm Uterine Serosa Involvement: Not identified Cervical Stromal Involvement: Not identified Extent of Involvement of Other Organs: Not involved Lymphovascular Invasion: Present Regional Lymph Nodes: Examined: 3 Sentinel 0 Non-sentinel 3 Total Lymph nodes with metastases: 1 Isolated tumor cells (< 0.2 mm): 0 Micrometastasis (> 0.2 mm and < 2.0 mm): 0 Macrometastasis (> 2.0 mm): 1 Extracapsular extension: No definite ECE MMR/MSI Testing: Will be performed and the results reported separately Pathologic Stage Classification (pTNM, AJCC 8th edition): pT1a, pN1a FIGO Stage: III-C1 Representative Tumor Block: 1-A Comment: The size of the metastatic deposit in the right external iliac sentinel lymph node is 32 mm. The metastatic deposit is virtually all serous carcinoma. Intradepartmental consultation was obtained   10/14/2018 Surgery   Pre-operative Diagnosis: endometrial cancer grade 3 (serous), obesity  Operation: Robotic-assisted laparoscopic total hysterectomy with bilateral salpingoophorectomy, SLN,  biopsy (22 modifier for obesity, BMI 32).  Surgeon: Donaciano Eva  Operative Findings:  : 8cm arcuate uterus, normal appearing tubes and ovaries. Obesity with BMI 32. Narrow vaginal introitus.     10/17/2018 Cancer Staging   Staging form: Corpus Uteri - Carcinoma and Carcinosarcoma, AJCC 8th Edition - Pathologic: FIGO Stage IIIC1 (pT1, pN1a, cM0) - Signed by Heath Lark, MD on 10/17/2018    Genetic Testing   Patient has genetic testing done for MMR. Results revealed patient has the following mutation(s): MMR: Normal    Genetic Testing   Patient has genetic testing done for MSI on pathology from 10/14/18. Results revealed patient has the following: MSI Stable   11/06/2018 Procedure   Placement of single lumen port a cath via right internal jugular vein. The  catheter tip lies at the cavo-atrial junction. A power injectable port a cath was placed and is ready for immediate use.   11/10/2018 -  Chemotherapy   The patient had carboplatin and taxol for chemotherapy treatment.     11/10/2018 -  Chemotherapy   The patient had carboplatin and taxol for chemotherapy treatment.        Objective:  Vitals:   12/31/18 0530 12/31/18 0600  BP: (!) 113/58 (!) 108/56  Pulse: 80 83  Resp: 17 20  Temp:    SpO2: 97% 95%     Intake/Output Summary (Last 24 hours) at 12/31/2018 0838 Last data filed at 12/31/2018 5427 Gross per 24 hour  Intake 2098.1 ml  Output -  Net 2098.1 ml    GENERAL:alert, no distress and comfortable SKIN: skin color is pale, texture, turgor are normal, no rashes or significant lesions EYES: normal, Conjunctiva are pink and non-injected, sclera clear OROPHARYNX:no exudate, no erythema and lips, buccal mucosa, and tongue normal  NECK: supple, thyroid normal size, non-tender, without nodularity LYMPH:  no palpable lymphadenopathy in the cervical, axillary or inguinal LUNGS: clear to auscultation and percussion with normal breathing effort HEART: regular rate & rhythm and no murmurs and no lower extremity edema ABDOMEN:abdomen soft, non-tender and normal bowel sounds Musculoskeletal:no cyanosis of digits and no clubbing  NEURO: alert & oriented x 3 with fluent speech, no focal motor/sensory deficits   Labs:  Recent Labs    12/02/18 0836 12/23/18 0825 12/30/18 1805 12/31/18 0634  NA 136 137 133* 137  K 3.9 3.8 3.9 3.6  CL 102 104 99 106  CO2 22 20* 24 24  GLUCOSE 207* 187* 112* 94  BUN _0 CREATININE 0.90  0.96 0.75 0.68  CALCIUM 9.6 9.5 8.7* 8.4*  GFRNONAA >60 58* >60 >60  GFRAA >60 >60 >60 >60  PROT 7.5 7.5 6.5  --   ALBUMIN 4.0 4.0 3.7  --   AST _0 --   ALT 17 18 32  --   ALKPHOS 160* 165* 106  --   BILITOT 0.4 0.4 0.6  --     Studies:  Dg Chest Port 1 View  Result Date: 12/30/2018 CLINICAL  DATA:  Per EMS: 73 y.o female hx of cervical cancer. Pt c/o of feeling "off" and different than normal compared to other chemo treatments since last chemo treatment last Tuesday. Pt c/o of tingling in hands and feet, weakness, loss of appetite, diarrhea, trouble sleeping, and fevers (100-101 degrees). EXAM: PORTABLE CHEST 1 VIEW COMPARISON:  07/05/2016 FINDINGS: Cardiac silhouette is normal size. No mediastinal or hilar masses. No evidence of adenopathy. Clear lungs. No pleural effusion or pneumothorax. Right anterior chest wall, internal jugular central venous catheter has its tip in the lower superior vena cava. Skeletal structures are grossly intact. IMPRESSION: No acute cardiopulmonary disease. Electronically Signed   By: Lajean Manes M.D.   On: 12/30/2018 17:44

## 2018-12-31 NOTE — Progress Notes (Signed)
PHARMACY - PHYSICIAN COMMUNICATION CRITICAL VALUE ALERT - BLOOD CULTURE IDENTIFICATION (BCID)  Zoe Kim is an 73 y.o. female who presented to [facility] on 12/30/2018 with a chief complaint of neutropenic fever  Assessment:  Pt with endometrial CA admitted with neutropenic fever.  1/2 blood cx + CoNS- possibly contaminant.  Pt remains afebrile.   Name of physician (or Provider) Contacted: Lamar Blinks, NP  Current antibiotics: Cefepime 2gm IV q8h  Changes to prescribed antibiotics recommended:  Likely contaminant so current antibiotics appropriate.  May consider add Vancomycin if suspect true infection.   Results for orders placed or performed during the hospital encounter of 12/30/18  Blood Culture ID Panel (Reflexed) (Collected: 12/30/2018  6:05 PM)  Result Value Ref Range   Enterococcus species NOT DETECTED NOT DETECTED   Listeria monocytogenes NOT DETECTED NOT DETECTED   Staphylococcus species DETECTED (A) NOT DETECTED   Staphylococcus aureus (BCID) NOT DETECTED NOT DETECTED   Methicillin resistance DETECTED (A) NOT DETECTED   Streptococcus species NOT DETECTED NOT DETECTED   Streptococcus agalactiae NOT DETECTED NOT DETECTED   Streptococcus pneumoniae NOT DETECTED NOT DETECTED   Streptococcus pyogenes NOT DETECTED NOT DETECTED   Acinetobacter baumannii NOT DETECTED NOT DETECTED   Enterobacteriaceae species NOT DETECTED NOT DETECTED   Enterobacter cloacae complex NOT DETECTED NOT DETECTED   Escherichia coli NOT DETECTED NOT DETECTED   Klebsiella oxytoca NOT DETECTED NOT DETECTED   Klebsiella pneumoniae NOT DETECTED NOT DETECTED   Proteus species NOT DETECTED NOT DETECTED   Serratia marcescens NOT DETECTED NOT DETECTED   Haemophilus influenzae NOT DETECTED NOT DETECTED   Neisseria meningitidis NOT DETECTED NOT DETECTED   Pseudomonas aeruginosa NOT DETECTED NOT DETECTED   Candida albicans NOT DETECTED NOT DETECTED   Candida glabrata NOT DETECTED NOT DETECTED   Candida  krusei NOT DETECTED NOT DETECTED   Candida parapsilosis NOT DETECTED NOT DETECTED   Candida tropicalis NOT DETECTED NOT DETECTED    Biagio Borg 12/31/2018  9:00 PM

## 2018-12-31 NOTE — ED Notes (Signed)
Pump beeping in pt room. Air in [employer]. Restarted. Pt went back to sleep.

## 2019-01-01 LAB — CBC WITH DIFFERENTIAL/PLATELET
Abs Immature Granulocytes: 0.02 10*3/uL (ref 0.00–0.07)
Basophils Absolute: 0 10*3/uL (ref 0.0–0.1)
Basophils Relative: 1 %
Eosinophils Absolute: 0.1 10*3/uL (ref 0.0–0.5)
Eosinophils Relative: 3 %
HCT: 24.1 % — ABNORMAL LOW (ref 36.0–46.0)
Hemoglobin: 8.1 g/dL — ABNORMAL LOW (ref 12.0–15.0)
Immature Granulocytes: 1 %
Lymphocytes Relative: 60 %
Lymphs Abs: 1 10*3/uL (ref 0.7–4.0)
MCH: 30.5 pg (ref 26.0–34.0)
MCHC: 33.6 g/dL (ref 30.0–36.0)
MCV: 90.6 fL (ref 80.0–100.0)
Monocytes Absolute: 0.2 10*3/uL (ref 0.1–1.0)
Monocytes Relative: 14 %
Neutro Abs: 0.3 10*3/uL — ABNORMAL LOW (ref 1.7–7.7)
Neutrophils Relative %: 21 %
Platelets: 130 10*3/uL — ABNORMAL LOW (ref 150–400)
RBC: 2.66 MIL/uL — ABNORMAL LOW (ref 3.87–5.11)
RDW: 15.1 % (ref 11.5–15.5)
WBC: 1.6 10*3/uL — ABNORMAL LOW (ref 4.0–10.5)
nRBC: 0 % (ref 0.0–0.2)

## 2019-01-01 LAB — BASIC METABOLIC PANEL
Anion gap: 7 (ref 5–15)
BUN: 14 mg/dL (ref 8–23)
CO2: 24 mmol/L (ref 22–32)
Calcium: 8.3 mg/dL — ABNORMAL LOW (ref 8.9–10.3)
Chloride: 105 mmol/L (ref 98–111)
Creatinine, Ser: 0.65 mg/dL (ref 0.44–1.00)
GFR calc Af Amer: >60 mL/min (ref >60)
GFR calc non Af Amer: >60 mL/min (ref >60)
Glucose, Bld: 94 mg/dL (ref 70–99)
Potassium: 3.6 mmol/L (ref 3.5–5.1)
Sodium: 136 mmol/L (ref 135–145)

## 2019-01-01 LAB — TYPE AND SCREEN
ABO/RH(D): O POS
Antibody Screen: NEGATIVE

## 2019-01-01 LAB — URINE CULTURE: Culture: NO GROWTH

## 2019-01-01 MED ORDER — SODIUM CHLORIDE 0.9% FLUSH: 10.0000 mL | INTRAVENOUS | Status: DC | PRN

## 2019-01-01 MED ORDER — POLYETHYLENE GLYCOL 3350 17 G PO PACK
17.0000 g | PACK | Freq: Every day | ORAL | Status: DC
  Administered 2019-01-01 – 2019-01-03 (×3): 17 g via ORAL
  Filled 2019-01-01 (×2): qty 1

## 2019-01-01 NOTE — Plan of Care (Signed)

## 2019-01-01 NOTE — Progress Notes (Addendum)
Zoe Kim   DOB:Oct 06, 1945   [id number]   I have seen the patient, examined her, and agree with the documentation as outlined below  ASSESSMENT & PLAN:  Neutropenic fever So far, urinalysis is abnormal, urine culture shows no growth to date 1 set of blood cultures positive for staph-Questionable contaminant; repeat blood cultures drawn this morning Chest x-ray looks normal She will continue broad-spectrum and IV antibiotics On G-CSF support daily until [facility] is greater than 1.5 I will follow  Endometrial cancer She had received chemotherapy recently Continue aggressive supportive care  Acquired pancytopenia Due to recent chemotherapy Hemoglobin stable/slightly improved Type and screen was drawn this morning No transfusion is indicated today There is a small chance that she might need blood transfusion support while hospitalized Will recheck tomorrow  Constipation I will start her on regular laxatives  Code Status Full code  Goals of care Until neutropenic fever resolves  Discharge planning She will likely be here for the next 2 to 3 days.  I will follow  All questions were answered. The patient knows to call the clinic with any problems, questions or concerns.   Mikey Bussing, NP 01/01/2019 10:17 AM  Heath Lark, MD  Subjective:  The patient feels better this morning.  Denies fevers and chills.  Denies nausea and vomiting.  Reports having loose stools yesterday morning but none today.  Denies bleeding.  Urine culture shows no growth to date.  1 set of blood cultures drawn on admission with coag negative staph.  Thought to be a possible contaminant.  Repeat blood cultures were drawn earlier today.  Remains on IV antibiotics.  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  The patient reports 6 months or maybe longer of intermittent vaginal spotting.  She was seen by Dr. [name] on September 19, 2018 who performed a Pap smear which revealed atypical glandular cells and an endocervical biopsy which revealed a benign endocervical polyp, and endometrial biopsy which revealed high-grade serous endometrial adenocarcinoma and adenocarcinoma FIGO grade 2.    10/03/2018 Imaging   CT abdomen and pelvis 1. Enhancing soft tissue density in endometrial cavity, consistent with history of endometrial carcinoma. No evidence of extra uterine tumor extension or metastatic disease. 2. Tiny less than 1 cm anterior uterine fibroid. 3. Small hiatal hernia.  Aortic Atherosclerosis (ICD10-I70.0).   10/14/2018 Pathology Results   1. Lymph node, sentinel, biopsy, right external iliac - METASTATIC CARCINOMA PRESENT IN (1) OF (1) LYMPH NODE. 2. Lymph node, sentinel, biopsy, right deep obturator - ONE LYMPH NODE, NEGATIVE FOR CARCINOMA (0/1). 3. Lymph node, sentinel, biopsy, left external iliac - ONE LYMPH NODE, NEGATIVE FOR CARCINOMA (0/1). 4. Uterus +/- tubes/ovaries, neoplastic, cervix, bilateral fallopian tubes and ovaries - MIXED CELL CARCINOMA OF ENDOMETRIUM (MIXED ENDOMETRIOID CARCINOMA AND SEROUS CARCINOMA), WITH INVASION LESS THAN HALF OF THE MYOMETRIUM. - NO INVOLVEMENT OF UTERINE SEROSA, CERVICAL STROMA OR ADNEXA. - SEE ONCOLOGY TABLE. Microscopic Comment 4. UTERUS, CARCINOMA OR CARCINOSARCOMA Procedure: Total hysterectomy and bilateral salpingo-oophorectomy Histologic type: Mixed  cell carcinoma  (Endometrioid carcinoma 70% and Serous carcinoma 30%) Histologic Grade: Not applicable Myometrial Invasion: Present Depth of invasion: 6 mm Myometrial thickness: 17 mm Uterine Serosa Involvement: Not identified Cervical Stromal Involvement: Not identified Extent of Involvement of Other Organs: Not involved Lymphovascular Invasion: Present Regional Lymph Nodes: Examined: 3 Sentinel 0 Non-sentinel 3 Total Lymph nodes with metastases: 1 Isolated tumor cells (< 0.2 mm): 0 Micrometastasis (> 0.2 mm and < 2.0 mm): 0 Macrometastasis (> 2.0 mm): 1 Extracapsular extension: No definite ECE MMR/MSI Testing: Will be performed and the results reported separately Pathologic Stage Classification (pTNM, AJCC 8th edition): pT1a, pN1a FIGO Stage: III-C1 Representative Tumor Block: 1-A Comment: The size of the metastatic deposit in the right external iliac sentinel lymph node is 32 mm. The metastatic deposit is virtually all serous carcinoma. Intradepartmental consultation was obtained   10/14/2018 Surgery   Pre-operative Diagnosis: endometrial cancer grade 3 (serous), obesity  Operation: Robotic-assisted laparoscopic total hysterectomy with bilateral salpingoophorectomy, SLN,  biopsy (22 modifier for obesity, BMI 32).  Surgeon: Donaciano Eva  Operative Findings:  : 8cm arcuate uterus, normal appearing tubes and ovaries. Obesity with BMI 32. Narrow vaginal introitus.     10/17/2018 Cancer Staging   Staging form: Corpus Uteri - Carcinoma and Carcinosarcoma, AJCC 8th Edition - Pathologic: FIGO Stage IIIC1 (pT1, pN1a, cM0) - Signed by Heath Lark, MD on 10/17/2018    Genetic Testing   Patient has genetic testing done for MMR. Results revealed patient has the following mutation(s): MMR: Normal    Genetic Testing   Patient has genetic testing done for MSI on pathology from 10/14/18. Results revealed patient has the following: MSI Stable   11/06/2018 Procedure   Placement of single lumen  port a cath via right internal jugular vein. The catheter tip lies at the cavo-atrial junction. A power injectable port a cath was placed and is ready for immediate use.   11/10/2018 -  Chemotherapy   The patient had carboplatin and taxol for chemotherapy treatment.     11/10/2018 -  Chemotherapy   The patient had carboplatin and taxol for chemotherapy treatment.        Objective:  Vitals:   12/31/18 2057 01/01/19 0517  BP: 122/69 130/74  Pulse: 92 87  Resp: 16 18  Temp: 98.9 F (37.2 C) 98.5 F (36.9 C)  SpO2: 97% 98%     Intake/Output Summary (Last 24 hours) at 01/01/2019 1017 Last data filed at 01/01/2019 0600 Gross per 24 hour  Intake 496.5 ml  Output 0 ml  Net 496.5 ml    GENERAL:alert, no distress and comfortable SKIN: skin color is pale, texture, turgor are normal, no rashes or significant lesions EYES: normal, Conjunctiva are pink and non-injected, sclera clear OROPHARYNX:no exudate, no erythema and lips, buccal mucosa, and tongue normal  NECK: supple, thyroid normal size, non-tender, without nodularity LYMPH:  no palpable lymphadenopathy in the cervical, axillary or inguinal LUNGS: clear to auscultation and percussion with normal breathing effort HEART: regular rate & rhythm and no murmurs and no lower extremity edema ABDOMEN:abdomen soft, non-tender and normal bowel sounds Musculoskeletal:no cyanosis of digits and no clubbing  NEURO: alert & oriented x 3 with fluent speech, no focal motor/sensory deficits   Labs:  Recent Labs    12/02/18 0836 12/23/18 0825 12/30/18 1805 12/31/18 0634 01/01/19 0508  NA 136 137 133* 137 136  K 3.9 3.8 3.9 3.6 3.6  CL 102 104 99 106 105  CO2 22 20* 24 24 24  GLUCOSE 207* 187* 112* 94 94  BUN '12 12 18 16 14  '$ CREATININE 0.90 0.96 0.75 0.68 0.65  CALCIUM 9.6 9.5 8.7* 8.4* 8.3*  GFRNONAA >60 58* >60 >60 >60  GFRAA >60 >60 >60 >60 >60  PROT 7.5 7.5 6.5  --   --   ALBUMIN 4.0 4.0 3.7  --   --   AST '19 19 30  '$ --   --   ALT  17 18 32  --   --   ALKPHOS 160* 165* 106  --   --   BILITOT 0.4 0.4 0.6  --   --     Studies:  Dg Chest Port 1 View  Result Date: 12/30/2018 CLINICAL DATA:  Per EMS: 73 y.o female hx of cervical cancer. Pt c/o of feeling "off" and different than normal compared to other chemo treatments since last chemo treatment last Tuesday. Pt c/o of tingling in hands and feet, weakness, loss of appetite, diarrhea, trouble sleeping, and fevers (100-101 degrees). EXAM: PORTABLE CHEST 1 VIEW COMPARISON:  07/05/2016 FINDINGS: Cardiac silhouette is normal size. No mediastinal or hilar masses. No evidence of adenopathy. Clear lungs. No pleural effusion or pneumothorax. Right anterior chest wall, internal jugular central venous catheter has its tip in the lower superior vena cava. Skeletal structures are grossly intact. IMPRESSION: No acute cardiopulmonary disease. Electronically Signed   By: Lajean Manes M.D.   On: 12/30/2018 17:44

## 2019-01-01 NOTE — Progress Notes (Signed)
PROGRESS NOTE    Zoe Kim  QJJ:[id number] DOB: Mar 22, 1946 DOA: 12/30/2018 PCP: [name], MD     Brief Narrative:  Zoe Kim is a 73 y.o. female with medical history significant for endometrial cancer undergoing chemotherapy with carboplatin and Taxol, depression/anxiety who presents to the ED for evaluation of intermittent fevers, generalized weakness, and worsening numbness/tingling in her lower legs progressive since her last chemotherapy treatment on 12/23/2018.  Patient is currently undergoing chemotherapy with carboplatin and Taxol.  She states normally afterwards she has some numbness/tingling in her fingertips and in her legs below her knees.  After this treatment she has been having new onset intermittent fevers with highest up to 100.8 Fahrenheit that she recorded at home on 12/29/2018.  She has been waking up in the middle night with chills but denies any diaphoresis.  She denies any associated chest pain, dyspnea, cough, abdominal pain, or constipation.  She reports having loose semisoft stools for several days now.  She states her urine output is decreased compared to what she is used to but denies any dysuria.  Patient was started on empiric antibiotics and admitted for neutropenic fever.  New events last 24 hours / Subjective: No further fevers, feeling well overall, asking when she can go home.  No nausea, vomiting, diarrhea, dysuria   Assessment & Plan:   Principal Problem:   Neutropenic fever ([place]) Active Problems:   Endometrial cancer ([place])   Anemia due to antineoplastic chemotherapy   Neutropenic fever, ?Source -Chest x-ray negative for acute cardiopulmonary disease -COVID-19 negative -Blood cultures 1 set showing coag negative staph, second set negative growth.  This could indicate contamination.  Repeat blood cultures ordered -UA showed moderate leukocytes, positive nitrite, >50 WBC.  Urine culture negative -Continue empiric IV  cefepime and monitor fever curve, repeat blood culture -Oncology added G-CSF   Endometrial cancer -Follows with Dr. Alvy Bimler, currently undergoing chemotherapy  Pancytopenia secondary to chemotherapy -Continue to monitor       DVT prophylaxis: Lovenox Code Status: Full code Family Communication: None Disposition Plan: Further improvement and blood culture result   Consultants:   Oncology  Procedures:   None  Antimicrobials:  Anti-infectives (From admission, onward)   Start     Dose/Rate Route Frequency Ordered Stop   12/31/18 0600  ceFEPIme (MAXIPIME) 2 g in sodium chloride 0.9 % 100 mL IVPB     2 g 200 mL/hr over 30 Minutes Intravenous Every 8 hours 12/30/18 2305     12/30/18 2115  ceFEPIme (MAXIPIME) 2 g in sodium chloride 0.9 % 100 mL IVPB     2 g 200 mL/hr over 30 Minutes Intravenous  Once 12/30/18 2108 12/30/18 2231       Objective: Vitals:   12/31/18 1930 12/31/18 2008 12/31/18 2057 01/01/19 0517  BP: 114/70 128/80 122/69 130/74  Pulse: 92 94 92 87  Resp: (!) 21 20 16 18   Temp:  99 F (37.2 C) 98.9 F (37.2 C) 98.5 F (36.9 C)  TempSrc:  Oral Oral Oral  SpO2: 97% 98% 97% 98%  Weight:    80.5 kg  Height:        Intake/Output Summary (Last 24 hours) at 01/01/2019 1111 Last data filed at 01/01/2019 0600 Gross per 24 hour  Intake 496.5 ml  Output 0 ml  Net 496.5 ml   Filed Weights   12/30/18 1616 01/01/19 0517  Weight: 81.6 kg 80.5 kg    Examination: General exam: Appears calm and comfortable  Respiratory system:  Clear to auscultation. Respiratory effort normal. Cardiovascular system: S1 & S2 heard, RRR. No JVD, murmurs, rubs, gallops or clicks. No pedal edema. Gastrointestinal system: Abdomen is nondistended, soft and nontender. No organomegaly or masses felt. Normal bowel sounds heard. Central nervous system: Alert and oriented. No focal neurological deficits. Extremities: Symmetric 5 x 5 power. Skin: No rashes, lesions or ulcers  Psychiatry: Judgement and insight appear normal. Mood & affect appropriate.    Data Reviewed: I have personally reviewed following labs and imaging studies  CBC: Recent Labs  Lab 12/30/18 1805 12/31/18 0634 01/01/19 0508  WBC 1.1* 1.3* 1.6*  NEUTROABS 0.5*  --  0.3*  HGB 8.6* 7.9* 8.1*  HCT 25.7* 23.4* 24.1*  MCV 89.9 91.1 90.6  PLT 172 130* 956*   Basic Metabolic Panel: Recent Labs  Lab 12/30/18 1805 12/31/18 0634 01/01/19 0508  NA 133* 137 136  K 3.9 3.6 3.6  CL 99 106 105  CO2 24 24 24   GLUCOSE 112* 94 94  BUN 18 16 14   CREATININE 0.75 0.68 0.65  CALCIUM 8.7* 8.4* 8.3*   GFR: Estimated Creatinine Clearance: 63.3 mL/min (by C-G formula based on SCr of 0.65 mg/dL). Liver Function Tests: Recent Labs  Lab 12/30/18 1805  AST 30  ALT 32  ALKPHOS 106  BILITOT 0.6  PROT 6.5  ALBUMIN 3.7   Recent Labs  Lab 12/30/18 1805  LIPASE 70*   No results for input(s): AMMONIA in the last 168 hours. Coagulation Profile: No results for input(s): INR, PROTIME in the last 168 hours. Cardiac Enzymes: No results for input(s): CKTOTAL, CKMB, CKMBINDEX, TROPONINI in the last 168 hours. BNP (last 3 results) No results for input(s): PROBNP in the last 8760 hours. HbA1C: No results for input(s): HGBA1C in the last 72 hours. CBG: No results for input(s): GLUCAP in the last 168 hours. Lipid Profile: No results for input(s): CHOL, HDL, LDLCALC, TRIG, CHOLHDL, LDLDIRECT in the last 72 hours. Thyroid Function Tests: No results for input(s): TSH, T4TOTAL, FREET4, T3FREE, THYROIDAB in the last 72 hours. Anemia Panel: No results for input(s): VITAMINB12, FOLATE, FERRITIN, TIBC, IRON, RETICCTPCT in the last 72 hours. Sepsis Labs: Recent Labs  Lab 12/30/18 1805  LATICACIDVEN 0.7    Recent Results (from the past 240 hour(s))  Culture, blood (Routine X 2) w Reflex to ID Panel     Status: None (Preliminary result)   Collection Time: 12/30/18  6:05 PM   Specimen: BLOOD  Result  Value Ref Range Status   Specimen Description   Final    BLOOD RIGHT CHEST Performed at [facility] [address], [place], [place] [address]    Special Requests   Final    BOTTLES DRAWN AEROBIC AND ANAEROBIC Blood Culture adequate volume Performed at [facility] [address], [place], [place] [address]    Culture   Final    NO GROWTH 2 DAYS Performed at [facility] [address], [place], [place] [address]    Report Status PENDING  Incomplete  Culture, blood (Routine X 2) w Reflex to ID Panel     Status: None (Preliminary result)   Collection Time: 12/30/18  6:05 PM   Specimen: BLOOD  Result Value Ref Range Status   Specimen Description   Final    BLOOD RIGHT ANTECUBITAL Performed at [facility] [address] [place], [place] [address]    Special Requests   Final    BOTTLES DRAWN AEROBIC AND ANAEROBIC  Blood Culture results may not be optimal due to an excessive volume of blood received in culture bottles Performed at [facility] [address], [place], [place] [address]    Culture  Setup Time   Final    AEROBIC BOTTLE ONLY GRAM POSITIVE COCCI CRITICAL RESULT CALLED TO, READ BACK BY AND VERIFIED WITH: Sheffield Slider [facility] 12/31/18 2040 JDW Performed at [facility], [place] [address] [place], [place] [address]    Culture GRAM POSITIVE COCCI  Final   Report Status PENDING  Incomplete  Blood Culture ID Panel (Reflexed)     Status: Abnormal   Collection Time: 12/30/18  6:05 PM  Result Value Ref Range Status   Enterococcus species NOT DETECTED NOT DETECTED Final   Listeria monocytogenes NOT DETECTED NOT DETECTED Final   Staphylococcus species DETECTED (A) NOT DETECTED Final    Comment: Methicillin (oxacillin) resistant coagulase negative staphylococcus. Possible blood culture contaminant (unless isolated from more than one blood culture draw or clinical case suggests pathogenicity).  No antibiotic treatment is indicated for blood  culture contaminants. CRITICAL RESULT CALLED TO, READ BACK BY AND VERIFIED WITH: Sheffield Slider [facility] 12/31/18 2040 JDW    Staphylococcus aureus (BCID) NOT DETECTED NOT DETECTED Final   Methicillin resistance DETECTED (A) NOT DETECTED Final    Comment: CRITICAL RESULT CALLED TO, READ BACK BY AND VERIFIED WITH: Sheffield Slider [facility] 12/31/18 2040 JDW    Streptococcus species NOT DETECTED NOT DETECTED Final   Streptococcus agalactiae NOT DETECTED NOT DETECTED Final   Streptococcus pneumoniae NOT DETECTED NOT DETECTED Final   Streptococcus pyogenes NOT DETECTED NOT DETECTED Final   Acinetobacter baumannii NOT DETECTED NOT DETECTED Final   Enterobacteriaceae species NOT DETECTED NOT DETECTED Final   Enterobacter cloacae complex NOT DETECTED NOT DETECTED Final   Escherichia coli NOT DETECTED NOT DETECTED Final   Klebsiella oxytoca NOT DETECTED NOT DETECTED Final   Klebsiella pneumoniae NOT DETECTED NOT DETECTED Final   Proteus species NOT DETECTED NOT DETECTED Final   Serratia marcescens NOT DETECTED NOT DETECTED Final   Haemophilus influenzae NOT DETECTED NOT DETECTED Final   Neisseria meningitidis NOT DETECTED NOT DETECTED Final   Pseudomonas aeruginosa NOT DETECTED NOT DETECTED Final   Candida albicans NOT DETECTED NOT DETECTED Final   Candida glabrata NOT DETECTED NOT DETECTED Final   Candida krusei NOT DETECTED NOT DETECTED Final   Candida parapsilosis NOT DETECTED NOT DETECTED Final   Candida tropicalis NOT DETECTED NOT DETECTED Final    Comment: Performed at [facility], [place]. [address], [place], [place] [address]  SARS Coronavirus 2 (CEPHEID- Performed in [facility] lab), Hosp Order     Status: None   Collection Time: 12/30/18  6:19 PM   Specimen: Nasopharyngeal Swab  Result Value Ref Range Status   SARS Coronavirus 2 NEGATIVE NEGATIVE Final    Comment: (NOTE) If result is NEGATIVE SARS-CoV-2 target nucleic acids are  NOT DETECTED. The SARS-CoV-2 RNA is generally detectable in upper and lower  respiratory specimens during the acute phase of infection. The lowest  concentration of SARS-CoV-2 viral copies this assay can detect is 250  copies / mL. A negative result does not preclude SARS-CoV-2 infection  and should not be used as the sole basis for treatment or other  patient management decisions.  A negative result may occur with  improper specimen collection / handling, submission of specimen other  than nasopharyngeal swab, presence of viral mutation(s) within the  areas targeted by this  assay, and inadequate number of viral copies  (<250 copies / mL). A negative result must be combined with clinical  observations, patient history, and epidemiological information. If result is POSITIVE SARS-CoV-2 target nucleic acids are DETECTED. The SARS-CoV-2 RNA is generally detectable in upper and lower  respiratory specimens dur ing the acute phase of infection.  Positive  results are indicative of active infection with SARS-CoV-2.  Clinical  correlation with patient history and other diagnostic information is  necessary to determine patient infection status.  Positive results do  not rule out bacterial infection or co-infection with other viruses. If result is PRESUMPTIVE POSTIVE SARS-CoV-2 nucleic acids MAY BE PRESENT.   A presumptive positive result was obtained on the submitted specimen  and confirmed on repeat testing.  While 2019 novel coronavirus  (SARS-CoV-2) nucleic acids may be present in the submitted sample  additional confirmatory testing may be necessary for epidemiological  and / or clinical management purposes  to differentiate between  SARS-CoV-2 and other Sarbecovirus currently known to infect humans.  If clinically indicated additional testing with an alternate test  methodology [id number]) is advised. The SARS-CoV-2 RNA is generally  detectable in upper and lower respiratory sp ecimens  during the acute  phase of infection. The expected result is Negative. Fact Sheet for Patients:  StrictlyIdeas.no Fact Sheet for Healthcare Providers: BankingDealers.co.za This test is not yet approved or cleared by the [place] FDA and has been authorized for detection and/or diagnosis of SARS-CoV-2 by FDA under an Emergency Use Authorization (EUA).  This EUA will remain in effect (meaning this test can be used) for the duration of the COVID-19 declaration under Section 564(b)(1) of the Act, 21 U.S.C. section 360bbb-3(b)(1), unless the authorization is terminated or revoked sooner. Performed at [facility], [place] [address], [place], [place] [address]   Urine Culture     Status: None   Collection Time: 12/30/18  9:03 PM   Specimen: Urine, Random  Result Value Ref Range Status   Specimen Description   Final    URINE, RANDOM Performed at [facility], [place] [address], [place], [place] [address]    Special Requests   Final    NONE Performed at [facility], [place] [address] [place], [place] [address]    Culture   Final    NO GROWTH Performed at [facility], [place] [address] [place], [place] [address]    Report Status 01/01/2019 FINAL  Final      Radiology Studies: Dg Chest Port 1 View  Result Date: 12/30/2018 CLINICAL DATA:  Per EMS: 73 y.o female hx of cervical cancer. Pt c/o of feeling "off" and different than normal compared to other chemo treatments since last chemo treatment last Tuesday. Pt c/o of tingling in hands and feet, weakness, loss of appetite, diarrhea, trouble sleeping, and fevers (100-101 degrees). EXAM: PORTABLE CHEST 1 VIEW COMPARISON:  07/05/2016 FINDINGS: Cardiac silhouette is normal size. No mediastinal or hilar masses. No evidence of adenopathy. Clear lungs. No pleural effusion or pneumothorax. Right anterior chest wall, internal jugular central venous catheter  has its tip in the lower superior vena cava. Skeletal structures are grossly intact. IMPRESSION: No acute cardiopulmonary disease. Electronically Signed   By: Lajean Manes M.D.   On: 12/30/2018 17:44      Scheduled Meds: . enoxaparin (LOVENOX) injection  40 mg Subcutaneous QHS  . Tbo-filgastrim (GRANIX) SQ  300 mcg Subcutaneous Daily   Continuous Infusions: . sodium chloride 10  mL/hr at 01/01/19 0200  . ceFEPime (MAXIPIME) IV 2 g (01/01/19 9355)     LOS: 2 days      Time spent: 25 minutes   Dessa Phi, DO [facility] [email or web address] 01/01/2019, 11:11 AM

## 2019-01-02 ENCOUNTER — Telehealth: Payer: Self-pay | Admitting: *Deleted

## 2019-01-02 LAB — CBC WITH DIFFERENTIAL/PLATELET
Abs Immature Granulocytes: 0.01 10*3/uL (ref 0.00–0.07)
Basophils Absolute: 0 10*3/uL (ref 0.0–0.1)
Basophils Relative: 1 %
Eosinophils Absolute: 0.1 10*3/uL (ref 0.0–0.5)
Eosinophils Relative: 3 %
HCT: 25.1 % — ABNORMAL LOW (ref 36.0–46.0)
Hemoglobin: 8.2 g/dL — ABNORMAL LOW (ref 12.0–15.0)
Immature Granulocytes: 1 %
Lymphocytes Relative: 63 %
Lymphs Abs: 1.3 10*3/uL (ref 0.7–4.0)
MCH: 29.6 pg (ref 26.0–34.0)
MCHC: 32.7 g/dL (ref 30.0–36.0)
MCV: 90.6 fL (ref 80.0–100.0)
Monocytes Absolute: 0.5 10*3/uL (ref 0.1–1.0)
Monocytes Relative: 23 %
Neutro Abs: 0.2 10*3/uL — ABNORMAL LOW (ref 1.7–7.7)
Neutrophils Relative %: 9 %
Platelets: 142 10*3/uL — ABNORMAL LOW (ref 150–400)
RBC: 2.77 MIL/uL — ABNORMAL LOW (ref 3.87–5.11)
RDW: 15 % (ref 11.5–15.5)
WBC: 2 10*3/uL — ABNORMAL LOW (ref 4.0–10.5)
nRBC: 0 % (ref 0.0–0.2)

## 2019-01-02 LAB — CULTURE, BLOOD (ROUTINE X 2)

## 2019-01-02 NOTE — Progress Notes (Signed)
PROGRESS NOTE    Aundrea Horace Murakami  LZJ:[id number] DOB: November 19, 1945 DOA: 12/30/2018 PCP: [name], MD     Brief Narrative:  Zoe Kim is a 73 y.o. female with medical history significant for endometrial cancer undergoing chemotherapy with carboplatin and Taxol, depression/anxiety who presents to the ED for evaluation of intermittent fevers, generalized weakness, and worsening numbness/tingling in her lower legs progressive since her last chemotherapy treatment on 12/23/2018.  Patient is currently undergoing chemotherapy with carboplatin and Taxol.  She states normally afterwards she has some numbness/tingling in her fingertips and in her legs below her knees.  After this treatment she has been having new onset intermittent fevers with highest up to 100.8 Fahrenheit that she recorded at home on 12/29/2018.  She has been waking up in the middle night with chills but denies any diaphoresis.  She denies any associated chest pain, dyspnea, cough, abdominal pain, or constipation.  She reports having loose semisoft stools for several days now.  She states her urine output is decreased compared to what she is used to but denies any dysuria.  Patient was started on empiric antibiotics and admitted for neutropenic fever.  New events last 24 hours / Subjective: Feeling well overall.  No fevers overnight.   Assessment & Plan:   Principal Problem:   Neutropenic fever ([place]) Active Problems:   Endometrial cancer ([place])   Anemia due to antineoplastic chemotherapy   Neutropenic fever, ?Source -Chest x-ray negative for acute cardiopulmonary disease -COVID-19 negative -UA showed moderate leukocytes, positive nitrite, >50 WBC.  Urine culture negative -Blood cultures 1 set showing coag negative staph, second set negative growth.  This could indicate contamination.  Repeat blood cultures ordered -spoke with microbiology lab today, repeat blood cultures negative to date -Continue empiric  IV cefepime and monitor fever curve -Oncology added G-CSF   Endometrial cancer -Follows with Dr. Alvy Bimler, currently undergoing chemotherapy  Pancytopenia secondary to chemotherapy -Continue to monitor       DVT prophylaxis: Lovenox Code Status: Full code Family Communication: None Disposition Plan: Discussed with oncology, observe 1 more night due to concern for neutropenia and infection   Consultants:   Oncology  Procedures:   None  Antimicrobials:  Anti-infectives (From admission, onward)   Start     Dose/Rate Route Frequency Ordered Stop   12/31/18 0600  ceFEPIme (MAXIPIME) 2 g in sodium chloride 0.9 % 100 mL IVPB     2 g 200 mL/hr over 30 Minutes Intravenous Every 8 hours 12/30/18 2305     12/30/18 2115  ceFEPIme (MAXIPIME) 2 g in sodium chloride 0.9 % 100 mL IVPB     2 g 200 mL/hr over 30 Minutes Intravenous  Once 12/30/18 2108 12/30/18 2231       Objective: Vitals:   01/01/19 0517 01/01/19 1400 01/01/19 2200 01/02/19 0407  BP: 130/74 135/68 126/63 119/64  Pulse: 87 89 94 89  Resp: 18 17 14 16   Temp: 98.5 F (36.9 C) 97.9 F (36.6 C) 98 F (36.7 C) 99.2 F (37.3 C)  TempSrc: Oral Oral Oral Oral  SpO2: 98% 99% 100% 97%  Weight: 80.5 kg     Height:        Intake/Output Summary (Last 24 hours) at 01/02/2019 1351 Last data filed at 01/02/2019 0810 Gross per 24 hour  Intake 1103.83 ml  Output -  Net 1103.83 ml   Filed Weights   12/30/18 1616 01/01/19 0517  Weight: 81.6 kg 80.5 kg    Examination: General exam: Appears  calm and comfortable  Respiratory system: Clear to auscultation. Respiratory effort normal. Cardiovascular system: S1 & S2 heard, RRR. No JVD, murmurs, rubs, gallops or clicks. No pedal edema. Gastrointestinal system: Abdomen is nondistended, soft and nontender. No organomegaly or masses felt. Normal bowel sounds heard. Central nervous system: Alert and oriented. No focal neurological deficits. Extremities: Symmetric 5 x 5 power.  Skin: No rashes, lesions or ulcers Psychiatry: Judgement and insight appear normal. Mood & affect appropriate.    Data Reviewed: I have personally reviewed following labs and imaging studies  CBC: Recent Labs  Lab 12/30/18 1805 12/31/18 0634 01/01/19 0508 01/02/19 0251  WBC 1.1* 1.3* 1.6* 2.0*  NEUTROABS 0.5*  --  0.3* 0.2*  HGB 8.6* 7.9* 8.1* 8.2*  HCT 25.7* 23.4* 24.1* 25.1*  MCV 89.9 91.1 90.6 90.6  PLT 172 130* 130* 426*   Basic Metabolic Panel: Recent Labs  Lab 12/30/18 1805 12/31/18 0634 01/01/19 0508  NA 133* 137 136  K 3.9 3.6 3.6  CL 99 106 105  CO2 24 24 24   GLUCOSE 112* 94 94  BUN 18 16 14   CREATININE 0.75 0.68 0.65  CALCIUM 8.7* 8.4* 8.3*   GFR: Estimated Creatinine Clearance: 63.3 mL/min (by C-G formula based on SCr of 0.65 mg/dL). Liver Function Tests: Recent Labs  Lab 12/30/18 1805  AST 30  ALT 32  ALKPHOS 106  BILITOT 0.6  PROT 6.5  ALBUMIN 3.7   Recent Labs  Lab 12/30/18 1805  LIPASE 70*   No results for input(s): AMMONIA in the last 168 hours. Coagulation Profile: No results for input(s): INR, PROTIME in the last 168 hours. Cardiac Enzymes: No results for input(s): CKTOTAL, CKMB, CKMBINDEX, TROPONINI in the last 168 hours. BNP (last 3 results) No results for input(s): PROBNP in the last 8760 hours. HbA1C: No results for input(s): HGBA1C in the last 72 hours. CBG: No results for input(s): GLUCAP in the last 168 hours. Lipid Profile: No results for input(s): CHOL, HDL, LDLCALC, TRIG, CHOLHDL, LDLDIRECT in the last 72 hours. Thyroid Function Tests: No results for input(s): TSH, T4TOTAL, FREET4, T3FREE, THYROIDAB in the last 72 hours. Anemia Panel: No results for input(s): VITAMINB12, FOLATE, FERRITIN, TIBC, IRON, RETICCTPCT in the last 72 hours. Sepsis Labs: Recent Labs  Lab 12/30/18 1805  LATICACIDVEN 0.7    Recent Results (from the past 240 hour(s))  Culture, blood (Routine X 2) w Reflex to ID Panel     Status: None  (Preliminary result)   Collection Time: 12/30/18  6:05 PM   Specimen: BLOOD  Result Value Ref Range Status   Specimen Description   Final    BLOOD RIGHT CHEST Performed at [facility] [address], [place], [place] [address]    Special Requests   Final    BOTTLES DRAWN AEROBIC AND ANAEROBIC Blood Culture adequate volume Performed at [facility] [address], [place], [place] [address]    Culture   Final    NO GROWTH 3 DAYS Performed at [facility], [place] [address] [place], [place] [address]    Report Status PENDING  Incomplete  Culture, blood (Routine X 2) w Reflex to ID Panel     Status: Abnormal   Collection Time: 12/30/18  6:05 PM   Specimen: BLOOD  Result Value Ref Range Status   Specimen Description   Final    BLOOD RIGHT ANTECUBITAL Performed at [facility] [address], [place], [place] [address]    Special  Requests   Final    BOTTLES DRAWN AEROBIC AND ANAEROBIC Blood Culture results may not be optimal due to an excessive volume of blood received in culture bottles Performed at [facility] [address] [place], [place] [address]    Culture  Setup Time   Final    AEROBIC BOTTLE ONLY GRAM POSITIVE COCCI CRITICAL RESULT CALLED TO, READ BACK BY AND VERIFIED WITH: Sheffield Slider [facility] 12/31/18 2040 JDW    Culture (A)  Final    STAPHYLOCOCCUS SPECIES (COAGULASE NEGATIVE) THE SIGNIFICANCE OF ISOLATING THIS ORGANISM FROM A SINGLE SET OF BLOOD CULTURES WHEN MULTIPLE SETS ARE DRAWN IS UNCERTAIN. PLEASE NOTIFY THE MICROBIOLOGY DEPARTMENT WITHIN ONE WEEK IF SPECIATION AND SENSITIVITIES ARE REQUIRED. Performed at [facility], [place] [address], [place], [place] [address]    Report Status 01/02/2019 FINAL  Final  Blood Culture ID Panel (Reflexed)     Status: Abnormal   Collection Time: 12/30/18  6:05 PM  Result Value Ref Range Status   Enterococcus species NOT DETECTED NOT DETECTED  Final   Listeria monocytogenes NOT DETECTED NOT DETECTED Final   Staphylococcus species DETECTED (A) NOT DETECTED Final    Comment: Methicillin (oxacillin) resistant coagulase negative staphylococcus. Possible blood culture contaminant (unless isolated from more than one blood culture draw or clinical case suggests pathogenicity). No antibiotic treatment is indicated for blood  culture contaminants. CRITICAL RESULT CALLED TO, READ BACK BY AND VERIFIED WITH: Sheffield Sl[facility] 12/31/18 2040 JDW    Staphylococcus aureus (BCID) NOT DETECTED NOT DETECTED Final   Methicillin resistance DETECTED (A) NOT DETECTED Final    Comment: CRITICAL RESULT CALLED TO, READ BACK BY AND VERIFIED WITH: Sheffield Slider [facility] 12/31/18 2040 JDW    Streptococcus species NOT DETECTED NOT DETECTED Final   Streptococcus agalactiae NOT DETECTED NOT DETECTED Final   Streptococcus pneumoniae NOT DETECTED NOT DETECTED Final   Streptococcus pyogenes NOT DETECTED NOT DETECTED Final   Acinetobacter baumannii NOT DETECTED NOT DETECTED Final   Enterobacteriaceae species NOT DETECTED NOT DETECTED Final   Enterobacter cloacae complex NOT DETECTED NOT DETECTED Final   Escherichia coli NOT DETECTED NOT DETECTED Final   Klebsiella oxytoca NOT DETECTED NOT DETECTED Final   Klebsiella pneumoniae NOT DETECTED NOT DETECTED Final   Proteus species NOT DETECTED NOT DETECTED Final   Serratia marcescens NOT DETECTED NOT DETECTED Final   Haemophilus influenzae NOT DETECTED NOT DETECTED Final   Neisseria meningitidis NOT DETECTED NOT DETECTED Final   Pseudomonas aeruginosa NOT DETECTED NOT DETECTED Final   Candida albicans NOT DETECTED NOT DETECTED Final   Candida glabrata NOT DETECTED NOT DETECTED Final   Candida krusei NOT DETECTED NOT DETECTED Final   Candida parapsilosis NOT DETECTED NOT DETECTED Final   Candida tropicalis NOT DETECTED NOT DETECTED Final    Comment: Performed at [facility], [place]. [address] [place], [place]  [address]  SARS Coronavirus 2 (CEPHEID- Performed in [facility] lab), Hosp Order     Status: None   Collection Time: 12/30/18  6:19 PM   Specimen: Nasopharyngeal Swab  Result Value Ref Range Status   SARS Coronavirus 2 NEGATIVE NEGATIVE Final    Comment: (NOTE) If result is NEGATIVE SARS-CoV-2 target nucleic acids are NOT DETECTED. The SARS-CoV-2 RNA is generally detectable in upper and lower  respiratory specimens during the acute phase of infection. The lowest  concentration of SARS-CoV-2 viral copies this assay can detect is 250  copies / mL. A negative result does not  preclude SARS-CoV-2 infection  and should not be used as the sole basis for treatment or other  patient management decisions.  A negative result may occur with  improper specimen collection / handling, submission of specimen other  than nasopharyngeal swab, presence of viral mutation(s) within the  areas targeted by this assay, and inadequate number of viral copies  (<250 copies / mL). A negative result must be combined with clinical  observations, patient history, and epidemiological information. If result is POSITIVE SARS-CoV-2 target nucleic acids are DETECTED. The SARS-CoV-2 RNA is generally detectable in upper and lower  respiratory specimens dur ing the acute phase of infection.  Positive  results are indicative of active infection with SARS-CoV-2.  Clinical  correlation with patient history and other diagnostic information is  necessary to determine patient infection status.  Positive results do  not rule out bacterial infection or co-infection with other viruses. If result is PRESUMPTIVE POSTIVE SARS-CoV-2 nucleic acids MAY BE PRESENT.   A presumptive positive result was obtained on the submitted specimen  and confirmed on repeat testing.  While 2019 novel coronavirus  (SARS-CoV-2) nucleic acids may be present in the submitted sample  additional confirmatory testing may be necessary for  epidemiological  and / or clinical management purposes  to differentiate between  SARS-CoV-2 and other Sarbecovirus currently known to infect humans.  If clinically indicated additional testing with an alternate test  methodology [phone]) is advised. The SARS-CoV-2 RNA is generally  detectable in upper and lower respiratory sp ecimens during the acute  phase of infection. The expected result is Negative. Fact Sheet for Patients:  StrictlyIdeas.no Fact Sheet for Healthcare Providers: BankingDealers.co.za This test is not yet approved or cleared by the [place] FDA and has been authorized for detection and/or diagnosis of SARS-CoV-2 by FDA under an Emergency Use Authorization (EUA).  This EUA will remain in effect (meaning this test can be used) for the duration of the COVID-19 declaration under Section 564(b)(1) of the Act, 21 U.S.C. section 360bbb-3(b)(1), unless the authorization is terminated or revoked sooner. Performed at [facility], [place] [address] [place], [place] [address]   Urine Culture     Status: None   Collection Time: 12/30/18  9:03 PM   Specimen: Urine, Random  Result Value Ref Range Status   Specimen Description   Final    URINE, RANDOM Performed at [facility], [place] [address], [place], [place] [address]    Special Requests   Final    NONE Performed at [facility], [place] [address] [place], [place] [address]    Culture   Final    NO GROWTH Performed at [facility], [place] [address], [place], [place] [address]    Report Status 01/01/2019 FINAL  Final  Culture, blood (routine x 2)     Status: None (Preliminary result)   Collection Time: 01/01/19  8:23 AM   Specimen: BLOOD  Result Value Ref Range Status   Specimen Description   Final    BLOOD RIGHT ARM Performed at [facility] [address], [place], [place] [address]    Special  Requests   Final    BOTTLES DRAWN AEROBIC AND ANAEROBIC Blood Culture adequate volume Performed at [facility] [address] [place], [place] [address]    Culture   Final    NO GROWTH 1 DAY Performed at [facility], [place] [address], [place], [place] [address]  Report Status PENDING  Incomplete  Culture, blood (routine x 2)     Status: None (Preliminary result)   Collection Time: 01/01/19  8:27 AM   Specimen: BLOOD  Result Value Ref Range Status   Specimen Description   Final    BLOOD LEFT ARM Performed at [facility] [address], [place], [place] [address]    Special Requests   Final    BOTTLES DRAWN AEROBIC AND ANAEROBIC Blood Culture adequate volume Performed at [facility] [address] [place], [place] [address]    Culture   Final    NO GROWTH 1 DAY Performed at [facility], [place] [address], [place], [place] [address]    Report Status PENDING  Incomplete      Radiology Studies: No results found.    Scheduled Meds: . enoxaparin (LOVENOX) injection  40 mg Subcutaneous QHS  . polyethylene glycol  17 g Oral Daily  . Tbo-filgastrim (GRANIX) SQ  300 mcg Subcutaneous Daily   Continuous Infusions: . sodium chloride 10 mL/hr at 01/01/19 0200  . ceFEPime (MAXIPIME) IV 2 g (01/02/19 0526)     LOS: 3 days      Time spent: 25 minutes   Dessa Phi, DO [facility] [email or web address] 01/02/2019, 1:51 PM

## 2019-01-02 NOTE — Care Management Important Message (Signed)
Important Message  Patient Details IM Letter given to [name] SW to present to the Patient Name: Zoe Kim MRN: [id number] Date of Birth: 05-Feb-1946   Medicare Important Message Given:  Yes     [name] 01/02/2019, 11:57 AM

## 2019-01-02 NOTE — Plan of Care (Signed)

## 2019-01-02 NOTE — Progress Notes (Addendum)
Zoe Kim   DOB:1945-08-30   [id number]   I have seen the patient, examined her, and agree with the documentation as outlined below  ASSESSMENT & PLAN:  Neutropenic fever So far, urinalysis is abnormal, urine culture shows no growth to date 1 set of blood cultures positive for staph-Questionable contaminant; repeat blood cultures pending Chest x-ray looks normal She will continue broad-spectrum and IV antibiotics On G-CSF support daily until [place] is greater than 1.5 If her cultures came back negative, even if her ANC is not all the way to normal, she can be discharged with oral antibiotics, provided she has been afebrile for at least 24 hours prior to discharge  Endometrial cancer She had received chemotherapy recently Continue aggressive supportive care  Acquired pancytopenia Due to recent chemotherapy Hemoglobin stable Type and screen drawn 12/31/2018 No transfusion is indicated today There is a small chance that she might need blood transfusion support while hospitalized Will recheck tomorrow  Constipation On regular laxatives  Code Status Full code  Goals of care Until neutropenic fever resolves  Discharge planning I am hopeful she can be discharged over the weekend I will call her to set up follow-up appointment next week if she is discharged  All questions were answered. The patient knows to call the clinic with any problems, questions or concerns.   Zoe Bussing, NP 01/02/2019 10:44 AM  Zoe Kim Zoe Kim Subjective:  Feels better overall.  Reports some intermittent mild fatigue.  She has not had any recurrent fevers or chills.  Denies chest discomfort, shortness of breath, cough.  No recurrent diarrhea.  Denies bleeding.  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  The patient reports 6 months or maybe longer of intermittent vaginal spotting.  She was seen by Zoe. 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    10/03/2018 Imaging   CT abdomen and pelvis 1. Enhancing soft tissue density in endometrial cavity, consistent with history of endometrial carcinoma. No evidence of extra uterine tumor extension or metastatic disease. 2. Tiny less than 1 cm anterior uterine fibroid. 3. Small hiatal hernia.  Aortic Atherosclerosis (ICD10-I70.0).   10/14/2018 Pathology Results   1. Lymph node, sentinel, biopsy, right external iliac - METASTATIC CARCINOMA PRESENT IN (1) OF (1) LYMPH NODE. 2. Lymph node, sentinel, biopsy, right deep obturator - ONE LYMPH NODE, NEGATIVE FOR CARCINOMA (0/1). 3. Lymph node, sentinel, biopsy, left external iliac - ONE LYMPH NODE, NEGATIVE FOR CARCINOMA (0/1). 4. Uterus +/- tubes/ovaries, neoplastic, cervix, bilateral fallopian tubes and ovaries - MIXED CELL CARCINOMA OF ENDOMETRIUM (MIXED ENDOMETRIOID CARCINOMA AND SEROUS CARCINOMA), WITH INVASION LESS THAN HALF OF THE MYOMETRIUM. - NO INVOLVEMENT OF UTERINE SEROSA, CERVICAL STROMA OR ADNEXA. - SEE ONCOLOGY TABLE. Microscopic Comment 4. UTERUS, CARCINOMA OR CARCINOSARCOMA Procedure: Total hysterectomy and bilateral salpingo-oophorectomy Histologic type: Mixed cell carcinoma  (Endometrioid  carcinoma 70% and Serous carcinoma 30%) Histologic Grade: Not applicable Myometrial Invasion: Present Depth of invasion: 6 mm Myometrial thickness: 17 mm Uterine Serosa Involvement: Not identified Cervical Stromal Involvement: Not identified Extent of Involvement of Other Organs: Not involved Lymphovascular Invasion: Present Regional Lymph Nodes: Examined: 3 Sentinel 0 Non-sentinel 3 Total Lymph nodes with metastases: 1 Isolated tumor cells (< 0.2 mm): 0 Micrometastasis (> 0.2 mm and < 2.0 mm): 0 Macrometastasis (> 2.0 mm): 1 Extracapsular extension: No definite ECE MMR/MSI Testing: Will be performed and the results reported separately Pathologic Stage Classification (pTNM, AJCC 8th edition): pT1a, pN1a FIGO Stage: III-C1 Representative Tumor Block: 1-A Comment: The size of the metastatic deposit in the right external iliac sentinel lymph node is 32 mm. The metastatic deposit is virtually all serous carcinoma. Intradepartmental consultation was obtained   10/14/2018 Surgery   Pre-operative Diagnosis: endometrial cancer grade 3 (serous), obesity  Operation: Robotic-assisted laparoscopic total hysterectomy with bilateral salpingoophorectomy, SLN,  biopsy (22 modifier for obesity, BMI 32).  Surgeon: Zoe Kim  Operative Findings:  : 8cm arcuate uterus, normal appearing tubes and ovaries. Obesity with BMI 32. Narrow vaginal introitus.     10/17/2018 Cancer Staging   Staging form: Corpus Uteri - Carcinoma and Carcinosarcoma, AJCC 8th Edition - Pathologic: FIGO Stage IIIC1 (pT1, pN1a, cM0) - Signed by Zoe Lark, MD on 10/17/2018    Genetic Testing   Patient has genetic testing done for MMR. Results revealed patient has the following mutation(s): MMR: Normal    Genetic Testing   Patient has genetic testing done for MSI on pathology from 10/14/18. Results revealed patient has the following: MSI Stable   11/06/2018 Procedure   Placement of single lumen  port a cath via right internal jugular vein. The catheter tip lies at the cavo-atrial junction. A power injectable port a cath was placed and is ready for immediate use.   11/10/2018 -  Chemotherapy   The patient had carboplatin and taxol for chemotherapy treatment.     11/10/2018 -  Chemotherapy   The patient had carboplatin and taxol for chemotherapy treatment.        Objective:  Vitals:   01/01/19 2200 01/02/19 0407  BP: 126/63 119/64  Pulse: 94 89  Resp: 14 16  Temp: 98 F (36.7 C) 99.2 F (37.3 C)  SpO2: 100% 97%     Intake/Output Summary (Last 24 hours) at 01/02/2019 1044 Last data filed at 01/02/2019 0810 Gross per 24 hour  Intake 1103.83 ml  Output -  Net 1103.83 ml    GENERAL:alert, no distress and comfortable SKIN: skin color is pale, texture, turgor are normal, no rashes or significant lesions EYES: normal, Conjunctiva are pink and non-injected, sclera clear OROPHARYNX:no exudate, no erythema and lips, buccal mucosa, and tongue normal  NECK: supple, thyroid normal size, non-tender, without nodularity LYMPH:  no palpable lymphadenopathy in the cervical, axillary or inguinal LUNGS: clear to auscultation and percussion with normal breathing effort HEART: regular rate & rhythm and no murmurs and no lower extremity edema ABDOMEN:abdomen soft, non-tender and normal bowel sounds Musculoskeletal:no cyanosis of digits and no clubbing  NEURO: alert & oriented x 3 with fluent speech, no focal motor/sensory deficits   Labs:  Recent Labs    12/02/18 0836 12/23/18 0825 12/30/18 1805 12/31/18 0634 01/01/19 0508  NA 136 137 133* 137 136  K 3.9 3.8 3.9 3.6 3.6  CL 102 104 99 106 105  CO2 22 20* '24 24 24  '$ GLUCOSE 207* 187* 112* 94  94  BUN '12 12 18 16 14  '$ CREATININE 0.90 0.96 0.75 0.68 0.65  CALCIUM 9.6 9.5 8.7* 8.4* 8.3*  GFRNONAA >60 58* >60 >60 >60  GFRAA >60 >60 >60 >60 >60  PROT 7.5 7.5 6.5  --   --   ALBUMIN 4.0 4.0 3.7  --   --   AST '19 19 30  '$ --   --   ALT  17 18 32  --   --   ALKPHOS 160* 165* 106  --   --   BILITOT 0.4 0.4 0.6  --   --     Studies:  Dg Chest Port 1 View  Result Date: 12/30/2018 CLINICAL DATA:  Per EMS: 73 y.o female hx of cervical cancer. Pt c/o of feeling "off" and different than normal compared to other chemo treatments since last chemo treatment last Tuesday. Pt c/o of tingling in hands and feet, weakness, loss of appetite, diarrhea, trouble sleeping, and fevers (100-101 degrees). EXAM: PORTABLE CHEST 1 VIEW COMPARISON:  07/05/2016 FINDINGS: Cardiac silhouette is normal size. No mediastinal or hilar masses. No evidence of adenopathy. Clear lungs. No pleural effusion or pneumothorax. Right anterior chest wall, internal jugular central venous catheter has its tip in the lower superior vena cava. Skeletal structures are grossly intact. IMPRESSION: No acute cardiopulmonary disease. Electronically Signed   By: Lajean Manes M.D.   On: 12/30/2018 17:44

## 2019-01-02 NOTE — Progress Notes (Signed)
Patient called about 6pm  4 hours after her discharge and said she had misplaced her AVS possibly leaving it the hospital room. Room had been already been cleaned and nothing was reported. I hand wrote discharge instructions for husband to pick up at main entrance.

## 2019-01-02 NOTE — Telephone Encounter (Signed)
CALLED PATIENT TO INFORM OF NEW HDR [place] FOR 01-06-19 SPOKE WITH PATIENT AND SHE STATED THAT SHE IS NOW IN [place], NOTIFIED DR. Sondra Come

## 2019-01-03 LAB — CBC WITH DIFFERENTIAL/PLATELET
Abs Immature Granulocytes: 0.02 10*3/uL (ref 0.00–0.07)
Basophils Absolute: 0 10*3/uL (ref 0.0–0.1)
Basophils Relative: 1 %
Eosinophils Absolute: 0.1 10*3/uL (ref 0.0–0.5)
Eosinophils Relative: 2 %
HCT: 22.3 % — ABNORMAL LOW (ref 36.0–46.0)
Hemoglobin: 7.5 g/dL — ABNORMAL LOW (ref 12.0–15.0)
Immature Granulocytes: 1 %
Lymphocytes Relative: 50 %
Lymphs Abs: 1.5 10*3/uL (ref 0.7–4.0)
MCH: 30.6 pg (ref 26.0–34.0)
MCHC: 33.6 g/dL (ref 30.0–36.0)
MCV: 91 fL (ref 80.0–100.0)
Monocytes Absolute: 0.8 10*3/uL (ref 0.1–1.0)
Monocytes Relative: 27 %
Neutro Abs: 0.5 10*3/uL — ABNORMAL LOW (ref 1.7–7.7)
Neutrophils Relative %: 19 %
Platelets: 126 10*3/uL — ABNORMAL LOW (ref 150–400)
RBC: 2.45 MIL/uL — ABNORMAL LOW (ref 3.87–5.11)
RDW: 15 % (ref 11.5–15.5)
WBC: 2.9 10*3/uL — ABNORMAL LOW (ref 4.0–10.5)
nRBC: 0 % (ref 0.0–0.2)

## 2019-01-03 MED ORDER — CIPROFLOXACIN HCL 500 MG PO TABS: 500.0000 mg | ORAL_TABLET | Freq: Two times a day (BID) | ORAL | 0 refills | Status: AC

## 2019-01-03 MED ORDER — HEPARIN SOD (PORK) LOCK FLUSH 100 UNIT/ML IV SOLN
500.0000 [IU] | INTRAVENOUS | Status: AC | PRN
  Administered 2019-01-03: 12:00:00 500 [IU]

## 2019-01-03 MED ORDER — SODIUM CHLORIDE 0.9% FLUSH
10.0000 mL | INTRAVENOUS | Status: DC | PRN
  Administered 2019-01-03: 10 mL
  Filled 2019-01-03: qty 40

## 2019-01-03 MED ORDER — AMOXICILLIN-POT CLAVULANATE 875-125 MG PO TABS: 1.0000 | ORAL_TABLET | Freq: Two times a day (BID) | ORAL | 0 refills | Status: AC

## 2019-01-03 NOTE — Discharge Summary (Signed)
Physician Discharge Summary  Zoe Kim:[id number] DOB: Oct 04, 1945 DOA: 12/30/2018  PCP: [name], MD  Admit date: 12/30/2018 Discharge date: 01/03/2019  Admitted From: Home Disposition:  Home  Recommendations for Outpatient Follow-up:  1. Follow up with PCP in 1 week 2. Please follow-up with Dr. Alvy Bimler next week 3. Please follow up on the following pending results: Final blood culture results from 7/30.  They are negative growth to date on day of discharge  Discharge Condition: Stable CODE STATUS: Full  Diet recommendation: Regular diet   Brief/Interim Summary: Zoe Dargan Mayfieldis a 73 y.o.femalewith medical history significant forendometrial cancer undergoing chemotherapy with carboplatin and Taxol, depression/anxiety who presentstothe ED forevaluation of intermittent fevers, generalized weakness, and worsening numbness/tingling in her lower legs progressive since her last chemotherapy treatment on 12/23/2018.  Patient is currently undergoing chemotherapy with carboplatin and Taxol. She states normally afterwards she has some numbness/tingling in her fingertips and in her legs below her knees. After this treatment she has been having new onset intermittent fevers with highest up to 100.8 Fahrenheit that she recorded at home on 12/29/2018. She has been waking up in the middle night with chills but denies any diaphoresis. She denies any associated chest pain, dyspnea, cough, abdominal pain, or constipation. She reports having loose semisoft stools for several days now. She states her urine output is decreased compared to what she is used to but denies any dysuria.  Patient was started on empiric antibiotics and admitted for neutropenic fever.  Discharge Diagnoses:  Principal Problem:   Neutropenic fever ([place]) Active Problems:   Endometrial cancer ([place])   Anemia due to antineoplastic chemotherapy   Neutropenic fever, ?Source -Chest x-ray negative for  acute cardiopulmonary disease -COVID-19 negative -UA showed moderate leukocytes, positive nitrite, >50 WBC.  Urine culture negative -Blood cultures 1 set showing coag negative staph, second set negative growth.  This could indicate contamination.  Repeat blood cultures negative to date -Oncology added G-CSF while in hospital -Treated with IV cefepime during hospitalization.  De-escalate to Cipro, Augmentin on discharge for at least 7 days   Endometrial cancer -Follows with Dr. Alvy Bimler, currently undergoing chemotherapy  Pancytopenia secondary to chemotherapy -Continue to monitor   Discharge Instructions  Discharge Instructions    Call MD for:  difficulty breathing, headache or visual disturbances   Complete by: As directed    Call MD for:  extreme fatigue   Complete by: As directed    Call MD for:  hives   Complete by: As directed    Call MD for:  persistant dizziness or light-headedness   Complete by: As directed    Call MD for:  persistant nausea and vomiting   Complete by: As directed    Call MD for:  severe uncontrolled pain   Complete by: As directed    Call MD for:  temperature >100.4   Complete by: As directed    Diet general   Complete by: As directed    Discharge instructions   Complete by: As directed    You were cared for by a hospitalist during your hospital stay. If you have any questions about your discharge medications or the care you received while you were in the hospital after you are discharged, you can call the unit and ask to speak with the hospitalist on call if the hospitalist that took care of you is not available. Once you are discharged, your primary care physician will handle any further medical issues. Please note that NO REFILLS  for any discharge medications will be authorized once you are discharged, as it is imperative that you return to your primary care physician (or establish a relationship with a primary care physician if you do not have one)  for your aftercare needs so that they can reassess your need for medications and monitor your lab values.   Increase activity slowly   Complete by: As directed      Allergies as of 01/03/2019      Reactions   Sulfa Antibiotics    UNSPECIFIED REACTION       Medication List    STOP taking these medications   dexamethasone 4 MG tablet Commonly known as: DECADRON   lidocaine-prilocaine cream Commonly known as: EMLA   mirtazapine 15 MG tablet Commonly known as: Remeron   ondansetron 8 MG tablet Commonly known as: Zofran   oxybutynin 5 MG tablet Commonly known as: DITROPAN   prochlorperazine 10 MG tablet Commonly known as: COMPAZINE     TAKE these medications   acetaminophen 500 MG tablet Commonly known as: TYLENOL Take 1,000 mg by mouth every 6 (six) hours as needed for moderate pain.   amoxicillin-clavulanate 875-125 MG tablet Commonly known as: Augmentin Take 1 tablet by mouth 2 (two) times daily for 7 days.   ciprofloxacin 500 MG tablet Commonly known as: Cipro Take 1 tablet (500 mg total) by mouth 2 (two) times daily for 7 days.   ibuprofen 600 MG tablet Commonly known as: ADVIL Take 1 tablet (600 mg total) by mouth every 6 (six) hours as needed for moderate pain. For AFTER surgery   MELATONIN PO Take 1 tablet by mouth at bedtime as needed (sleep).   traMADol 50 MG tablet Commonly known as: ULTRAM Take 1 tablet (50 mg total) by mouth every 6 (six) hours as needed.      Follow-up Information    [name], MD Follow up.   Specialties: Family Medicine, Sports Medicine Contact information: [place] [address] [phone]        Heath Lark, MD Follow up.   Specialty: Hematology and Oncology Contact information: [place] [phone]          Allergies  Allergen Reactions  . Sulfa Antibiotics     UNSPECIFIED REACTION      Consultations:  Oncology   Procedures/Studies: Dg Chest Port 1 View  Result Date: 12/30/2018 CLINICAL DATA:  Per EMS: 73 y.o female hx of cervical cancer. Pt c/o of feeling "off" and different than normal compared to other chemo treatments since last chemo treatment last Tuesday. Pt c/o of tingling in hands and feet, weakness, loss of appetite, diarrhea, trouble sleeping, and fevers (100-101 degrees). EXAM: PORTABLE CHEST 1 VIEW COMPARISON:  07/05/2016 FINDINGS: Cardiac silhouette is normal size. No mediastinal or hilar masses. No evidence of adenopathy. Clear lungs. No pleural effusion or pneumothorax. Right anterior chest wall, internal jugular central venous catheter has its tip in the lower superior vena cava. Skeletal structures are grossly intact. IMPRESSION: No acute cardiopulmonary disease. Electronically Signed   By: Lajean Manes M.D.   On: 12/30/2018 17:44       Discharge Exam: Vitals:   01/02/19 2007 01/03/19 0608  BP: 133/69 127/75  Pulse: 95 99  Resp: 14 20  Temp: 98.4 F (36.9 C) 98.2 F (36.8 C)  SpO2: 99% 97%    General: Pt is alert, awake, not in acute distress Cardiovascular: RRR, S1/S2 +, no rubs, no gallops Respiratory: CTA  bilaterally, no wheezing, no rhonchi Abdominal: Soft, NT, ND, bowel sounds + Extremities: no edema, no cyanosis    The results of significant diagnostics from this hospitalization (including imaging, microbiology, ancillary and laboratory) are listed below for reference.     Microbiology: Recent Results (from the past 240 hour(s))  Culture, blood (Routine X 2) w Reflex to ID Panel     Status: None (Preliminary result)   Collection Time: 12/30/18  6:05 PM   Specimen: BLOOD  Result Value Ref Range Status   Specimen Description   Final    BLOOD RIGHT CHEST Performed at [facility] [address] [place], [place] [address]    Special Requests   Final    BOTTLES DRAWN AEROBIC AND ANAEROBIC Blood Culture  adequate volume Performed at [facility] [address] [place], [place] [address]    Culture   Final    NO GROWTH 4 DAYS Performed at [facility], [place] [address], [place], [place] [address]    Report Status PENDING  Incomplete  Culture, blood (Routine X 2) w Reflex to ID Panel     Status: Abnormal   Collection Time: 12/30/18  6:05 PM   Specimen: BLOOD  Result Value Ref Range Status   Specimen Description   Final    BLOOD RIGHT ANTECUBITAL Performed at [facility] [address], [place], [place] [address]    Special Requests   Final    BOTTLES DRAWN AEROBIC AND ANAEROBIC Blood Culture results may not be optimal due to an excessive volume of blood received in culture bottles Performed at [facility] [address], [place], [place] [address]    Culture  Setup Time   Final    AEROBIC BOTTLE ONLY GRAM POSITIVE COCCI CRITICAL RESULT CALLED TO, READ BACK BY AND VERIFIED WITH: Sheffield Slider [facility] 12/31/18 2040 JDW    Culture (A)  Final    STAPHYLOCOCCUS SPECIES (COAGULASE NEGATIVE) THE SIGNIFICANCE OF ISOLATING THIS ORGANISM FROM A SINGLE SET OF BLOOD CULTURES WHEN MULTIPLE SETS ARE DRAWN IS UNCERTAIN. PLEASE NOTIFY THE MICROBIOLOGY DEPARTMENT WITHIN ONE WEEK IF SPECIATION AND SENSITIVITIES ARE REQUIRED. Performed at [facility], [place] [address], [place], [place] [address]    Report Status 01/02/2019 FINAL  Final  Blood Culture ID Panel (Reflexed)     Status: Abnormal   Collection Time: 12/30/18  6:05 PM  Result Value Ref Range Status   Enterococcus species NOT DETECTED NOT DETECTED Final   Listeria monocytogenes NOT DETECTED NOT DETECTED Final   Staphylococcus species DETECTED (A) NOT DETECTED Final    Comment: Methicillin (oxacillin) resistant coagulase negative staphylococcus. Possible blood culture contaminant (unless isolated from more than one blood culture draw or clinical case suggests pathogenicity). No  antibiotic treatment is indicated for blood  culture contaminants. CRITICAL RESULT CALLED TO, READ BACK BY AND VERIFIED WITH: Sheffield Slider [facility] 12/31/18 2040 JDW    Staphylococcus aureus (BCID) NOT DETECTED NOT DETECTED Final   Methicillin resistance DETECTED (A) NOT DETECTED Final    Comment: CRITICAL RESULT CALLED TO, READ BACK BY AND VERIFIED WITH: Sheffield Slider [facility] 12/31/18 2040 JDW    Streptococcus species NOT DETECTED NOT DETECTED Final   Streptococcus agalactiae NOT DETECTED NOT DETECTED Final   Streptococcus pneumoniae NOT DETECTED NOT DETECTED Final   Streptococcus pyogenes NOT DETECTED NOT DETECTED Final   Acinetobacter baumannii NOT DETECTED NOT DETECTED Final   Enterobacteriaceae species NOT DETECTED NOT DETECTED Final  Enterobacter cloacae complex NOT DETECTED NOT DETECTED Final   Escherichia coli NOT DETECTED NOT DETECTED Final   Klebsiella oxytoca NOT DETECTED NOT DETECTED Final   Klebsiella pneumoniae NOT DETECTED NOT DETECTED Final   Proteus species NOT DETECTED NOT DETECTED Final   Serratia marcescens NOT DETECTED NOT DETECTED Final   Haemophilus influenzae NOT DETECTED NOT DETECTED Final   Neisseria meningitidis NOT DETECTED NOT DETECTED Final   Pseudomonas aeruginosa NOT DETECTED NOT DETECTED Final   Candida albicans NOT DETECTED NOT DETECTED Final   Candida glabrata NOT DETECTED NOT DETECTED Final   Candida krusei NOT DETECTED NOT DETECTED Final   Candida parapsilosis NOT DETECTED NOT DETECTED Final   Candida tropicalis NOT DETECTED NOT DETECTED Final    Comment: Performed at [facility], [place] [address] [place],  [address]  SARS Coronavirus 2 (CEPHEID- Performed in [facility] hospital lab), Hosp Order     Status: None   Collection Time: 12/30/18  6:19 PM   Specimen: Nasopharyngeal Swab  Result Value Ref Range Status   SARS Coronavirus 2 NEGATIVE NEGATIVE Final    Comment: (NOTE) If result is NEGATIVE SARS-CoV-2 target nucleic acids are NOT  DETECTED. The SARS-CoV-2 RNA is generally detectable in upper and lower  respiratory specimens during the acute phase of infection. The lowest  concentration of SARS-CoV-2 viral copies this assay can detect is 250  copies / mL. A negative result does not preclude SARS-CoV-2 infection  and should not be used as the sole basis for treatment or other  patient management decisions.  A negative result may occur with  improper specimen collection / handling, submission of specimen other  than nasopharyngeal swab, presence of viral mutation(s) within the  areas targeted by this assay, and inadequate number of viral copies  (<250 copies / mL). A negative result must be combined with clinical  observations, patient history, and epidemiological information. If result is POSITIVE SARS-CoV-2 target nucleic acids are DETECTED. The SARS-CoV-2 RNA is generally detectable in upper and lower  respiratory specimens dur ing the acute phase of infection.  Positive  results are indicative of active infection with SARS-CoV-2.  Clinical  correlation with patient history and other diagnostic information is  necessary to determine patient infection status.  Positive results do  not rule out bacterial infection or co-infection with other viruses. If result is PRESUMPTIVE POSTIVE SARS-CoV-2 nucleic acids MAY BE PRESENT.   A presumptive positive result was obtained on the submitted specimen  and confirmed on repeat testing.  While 2019 novel coronavirus  (SARS-CoV-2) nucleic acids may be present in the submitted sample  additional confirmatory testing may be necessary for epidemiological  and / or clinical management purposes  to differentiate between  SARS-CoV-2 and other Sarbecovirus currently known to infect humans.  If clinically indicated additional testing with an alternate test  methodology [phone]) is advised. The SARS-CoV-2 RNA is generally  detectable in upper and lower respiratory sp ecimens during  the acute  phase of infection. The expected result is Negative. Fact Sheet for Patients:  StrictlyIdeas.no Fact Sheet for Healthcare Providers: BankingDealers.co.za This test is not yet approved or cleared by the [place] FDA and has been authorized for detection and/or diagnosis of SARS-CoV-2 by FDA under an Emergency Use Authorization (EUA).  This EUA will remain in effect (meaning this test can be used) for the duration of the COVID-19 declaration under Section 564(b)(1) of the Act, 21 U.S.C. section 360bbb-3(b)(1), unless the authorization is terminated or revoked sooner.  Performed at [facility], [place] [address] [place], [place] [address]   Urine Culture     Status: None   Collection Time: 12/30/18  9:03 PM   Specimen: Urine, Random  Result Value Ref Range Status   Specimen Description   Final    URINE, RANDOM Performed at [facility], [place] [address], [place], [place] [address]    Special Requests   Final    NONE Performed at [facility], [place] [address], [place], [place] [address]    Culture   Final    NO GROWTH Performed at [facility], [place] [address], [place], [place] [address]    Report Status 01/01/2019 FINAL  Final  Culture, blood (routine x 2)     Status: None (Preliminary result)   Collection Time: 01/01/19  8:23 AM   Specimen: BLOOD  Result Value Ref Range Status   Specimen Description   Final    BLOOD RIGHT ARM Performed at [facility] [address] [place], [place] [address]    Special Requests   Final    BOTTLES DRAWN AEROBIC AND ANAEROBIC Blood Culture adequate volume Performed at [facility] [address] [place], [place] [address]    Culture   Final    NO GROWTH 2 DAYS Performed at [facility] [address] [place], [place] [address]    Report Status PENDING  Incomplete  Culture, blood  (routine x 2)     Status: None (Preliminary result)   Collection Time: 01/01/19  8:27 AM   Specimen: BLOOD  Result Value Ref Range Status   Specimen Description   Final    BLOOD LEFT ARM Performed at [facility] [address] [place], [place] [address]    Special Requests   Final    BOTTLES DRAWN AEROBIC AND ANAEROBIC Blood Culture adequate volume Performed at [facility] [address] [place],  [address]    Culture   Final    NO GROWTH 2 DAYS Performed at [facility] [address], [place],  [address]    Report Status PENDING  Incomplete     Labs: BNP (last 3 results) No results for input(s): BNP in the last 8760 hours. Basic Metabolic Panel: Recent Labs  Lab 12/30/18 1805 12/31/18 0634 01/01/19 0508  NA 133* 137 136  K 3.9 3.6 3.6  CL 99 106 105  CO2 24 24 24   GLUCOSE 112* 94 94  BUN 18 16 14   CREATININE 0.75 0.68 0.65  CALCIUM 8.7* 8.4* 8.3*   Liver Function Tests: Recent Labs  Lab 12/30/18 1805  AST 30  ALT 32  ALKPHOS 106  BILITOT 0.6  PROT 6.5  ALBUMIN 3.7   Recent Labs  Lab 12/30/18 1805  LIPASE 70*   No results for input(s): AMMONIA in the last 168 hours. CBC: Recent Labs  Lab 12/30/18 1805 12/31/18 0634 01/01/19 0508 01/02/19 0251 01/03/19 0609  WBC 1.1* 1.3* 1.6* 2.0* 2.9*  NEUTROABS 0.5*  --  0.3* 0.2* 0.5*  HGB 8.6* 7.9* 8.1* 8.2* 7.5*  HCT 25.7* 23.4* 24.1* 25.1* 22.3*  MCV 89.9 91.1 90.6 90.6 91.0  PLT 172 130* 130* 142* 126*   Cardiac Enzymes: No results for input(s): CKTOTAL, CKMB, CKMBINDEX, TROPONINI in the last 168 hours. BNP: Invalid input(s): POCBNP CBG: No results for input(s): GLUCAP in the last 168 hours. D-Dimer No results for input(s): DDIMER  in the last 72 hours. Hgb A1c No results for input(s): HGBA1C in the last 72 hours. Lipid Profile No results for input(s): CHOL, HDL, LDLCALC, TRIG, CHOLHDL, LDLDIRECT in the last 72 hours. Thyroid  function studies No results for input(s): TSH, T4TOTAL, T3FREE, THYROIDAB in the last 72 hours.  Invalid input(s): FREET3 Anemia work up No results for input(s): VITAMINB12, FOLATE, FERRITIN, TIBC, IRON, RETICCTPCT in the last 72 hours. Urinalysis    Component Value Date/Time   COLORURINE YELLOW 12/30/2018 2103   APPEARANCEUR CLEAR 12/30/2018 2103   LABSPEC 1.009 12/30/2018 2103   PHURINE 5.0 12/30/2018 2103   GLUCOSEU NEGATIVE 12/30/2018 2103   HGBUR SMALL (A) 12/30/2018 2103   BILIRUBINUR NEGATIVE 12/30/2018 2103   BILIRUBINUR negative 09/10/2018 1355   BILIRUBINUR Small 10/18/2014 [place] 12/30/2018 2103   PROTEINUR NEGATIVE 12/30/2018 2103   UROBILINOGEN 0.2 09/10/2018 1355   NITRITE POSITIVE (A) 12/30/2018 2103   LEUKOCYTESUR MODERATE (A) 12/30/2018 2103   Sepsis Labs Invalid input(s): PROCALCITONIN,  WBC,  LACTICIDVEN Microbiology Recent Results (from the past 240 hour(s))  Culture, blood (Routine X 2) w Reflex to ID Panel     Status: None (Preliminary result)   Collection Time: 12/30/18  6:05 PM   Specimen: BLOOD  Result Value Ref Range Status   Specimen Description   Final    BLOOD RIGHT CHEST Performed at [facility], [place] [address], [place], [place] [address]    Special Requests   Final    BOTTLES DRAWN AEROBIC AND ANAEROBIC Blood Culture adequate volume Performed at [facility] [address] [place], [place] [address]    Culture   Final    NO GROWTH 4 DAYS Performed at [facility], [place] [address], [place], [place] [address]    Report Status PENDING  Incomplete  Culture, blood (Routine X 2) w Reflex to ID Panel     Status: Abnormal   Collection Time: 12/30/18  6:05 PM   Specimen: BLOOD  Result Value Ref Range Status   Specimen Description   Final    BLOOD RIGHT ANTECUBITAL Performed at [facility] [address], [place], [place] [address]    Special Requests   Final     BOTTLES DRAWN AEROBIC AND ANAEROBIC Blood Culture results may not be optimal due to an excessive volume of blood received in culture bottles Performed at [facility] [address] [place], [place] [address]    Culture  Setup Time   Final    AEROBIC BOTTLE ONLY GRAM POSITIVE COCCI CRITICAL RESULT CALLED TO, READ BACK BY AND VERIFIED WITH: Sheffield Slider [facility] 12/31/18 2040 JDW    Culture (A)  Final    STAPHYLOCOCCUS SPECIES (COAGULASE NEGATIVE) THE SIGNIFICANCE OF ISOLATING THIS ORGANISM FROM A SINGLE SET OF BLOOD CULTURES WHEN MULTIPLE SETS ARE DRAWN IS UNCERTAIN. PLEASE NOTIFY THE MICROBIOLOGY DEPARTMENT WITHIN ONE WEEK IF SPECIATION AND SENSITIVITIES ARE REQUIRED. Performed at [facility], [place] [address], [place], [place] [address]    Report Status 01/02/2019 FINAL  Final  Blood Culture ID Panel (Reflexed)     Status: Abnormal   Collection Time: 12/30/18  6:05 PM  Result Value Ref Range Status   Enterococcus species NOT DETECTED NOT DETECTED Final   Listeria monocytogenes NOT DETECTED NOT DETECTED Final   Staphylococcus species DETECTED (A) NOT DETECTED Final    Comment: Methicillin (oxacillin) resistant coagulase negative staphylococcus. Possible blood culture contaminant (unless isolated  from more than one blood culture draw or clinical case suggests pathogenicity). No antibiotic treatment is indicated for blood  culture contaminants. CRITICAL RESULT CALLED TO, READ BACK BY AND VERIFIED WITH: Sheffield Slider [facility] 12/31/18 2040 JDW    Staphylococcus aureus (BCID) NOT DETECTED NOT DETECTED Final   Methicillin resistance DETECTED (A) NOT DETECTED Final    Comment: CRITICAL RESULT CALLED TO, READ BACK BY AND VERIFIED WITH: Sheffield Slider [facility] 12/31/18 2040 JDW    Streptococcus species NOT DETECTED NOT DETECTED Final   Streptococcus agalactiae NOT DETECTED NOT DETECTED Final   Streptococcus pneumoniae NOT DETECTED NOT DETECTED Final   Streptococcus pyogenes  NOT DETECTED NOT DETECTED Final   Acinetobacter baumannii NOT DETECTED NOT DETECTED Final   Enterobacteriaceae species NOT DETECTED NOT DETECTED Final   Enterobacter cloacae complex NOT DETECTED NOT DETECTED Final   Escherichia coli NOT DETECTED NOT DETECTED Final   Klebsiella oxytoca NOT DETECTED NOT DETECTED Final   Klebsiella pneumoniae NOT DETECTED NOT DETECTED Final   Proteus species NOT DETECTED NOT DETECTED Final   Serratia marcescens NOT DETECTED NOT DETECTED Final   Haemophilus influenzae NOT DETECTED NOT DETECTED Final   Neisseria meningitidis NOT DETECTED NOT DETECTED Final   Pseudomonas aeruginosa NOT DETECTED NOT DETECTED Final   Candida albicans NOT DETECTED NOT DETECTED Final   Candida glabrata NOT DETECTED NOT DETECTED Final   Candida krusei NOT DETECTED NOT DETECTED Final   Candida parapsilosis NOT DETECTED NOT DETECTED Final   Candida tropicalis NOT DETECTED NOT DETECTED Final    Comment: Performed at [facility], [place] [address], [place], [place] [address]  SARS Coronavirus 2 (CEPHEID- Performed in [place] hospital lab), Hosp Order     Status: None   Collection Time: 12/30/18  6:19 PM   Specimen: Nasopharyngeal Swab  Result Value Ref Range Status   SARS Coronavirus 2 NEGATIVE NEGATIVE Final    Comment: (NOTE) If result is NEGATIVE SARS-CoV-2 target nucleic acids are NOT DETECTED. The SARS-CoV-2 RNA is generally detectable in upper and lower  respiratory specimens during the acute phase of infection. The lowest  concentration of SARS-CoV-2 viral copies this assay can detect is 250  copies / mL. A negative result does not preclude SARS-CoV-2 infection  and should not be used as the sole basis for treatment or other  patient management decisions.  A negative result may occur with  improper specimen collection / handling, submission of specimen other  than nasopharyngeal swab, presence of viral mutation(s) within the  areas targeted by this assay, and  inadequate number of viral copies  (<250 copies / mL). A negative result must be combined with clinical  observations, patient history, and epidemiological information. If result is POSITIVE SARS-CoV-2 target nucleic acids are DETECTED. The SARS-CoV-2 RNA is generally detectable in upper and lower  respiratory specimens dur ing the acute phase of infection.  Positive  results are indicative of active infection with SARS-CoV-2.  Clinical  correlation with patient history and other diagnostic information is  necessary to determine patient infection status.  Positive results do  not rule out bacterial infection or co-infection with other viruses. If result is PRESUMPTIVE POSTIVE SARS-CoV-2 nucleic acids MAY BE PRESENT.   A presumptive positive result was obtained on the submitted specimen  and confirmed on repeat testing.  While 2019 novel coronavirus  (SARS-CoV-2) nucleic acids may be present in the submitted sample  additional confirmatory testing may be necessary for epidemiological  and / or clinical management purposes  to differentiate between  SARS-CoV-2 and other Sarbecovirus currently known to infect humans.  If clinically indicated additional testing with an alternate test  methodology [phone]) is advised. The SARS-CoV-2 RNA is generally  detectable in upper and lower respiratory sp ecimens during the acute  phase of infection. The expected result is Negative. Fact Sheet for Patients:  StrictlyIdeas.no Fact Sheet for Healthcare Providers: BankingDealers.co.za This test is not yet approved or cleared by the [place] FDA and has been authorized for detection and/or diagnosis of SARS-CoV-2 by FDA under an Emergency Use Authorization (EUA).  This EUA will remain in effect (meaning this test can be used) for the duration of the COVID-19 declaration under Section 564(b)(1) of the Act, 21 U.S.C. section 360bbb-3(b)(1), unless the  authorization is terminated or revoked sooner. Performed at [facility], [place] [address], [place], [place] [address]   Urine Culture     Status: None   Collection Time: 12/30/18  9:03 PM   Specimen: Urine, Random  Result Value Ref Range Status   Specimen Description   Final    URINE, RANDOM Performed at [facility], [place] [address], [place], [place] [address]    Special Requests   Final    NONE Performed at [facility], [place] [address], [place], [place] [address]    Culture   Final    NO GROWTH Performed at [facility], [place] [address], [place], [place] [address]    Report Status 01/01/2019 FINAL  Final  Culture, blood (routine x 2)     Status: None (Preliminary result)   Collection Time: 01/01/19  8:23 AM   Specimen: BLOOD  Result Value Ref Range Status   Specimen Description   Final    BLOOD RIGHT ARM Performed at [facility] [address], [place], [place] [address]    Special Requests   Final    BOTTLES DRAWN AEROBIC AND ANAEROBIC Blood Culture adequate volume Performed at [facility] [address] [place], [place] [address]    Culture   Final    NO GROWTH 2 DAYS Performed at [facility] [address] [place], [place] [address]    Report Status PENDING  Incomplete  Culture, blood (routine x 2)     Status: None (Preliminary result)   Collection Time: 01/01/19  8:27 AM   Specimen: BLOOD  Result Value Ref Range Status   Specimen Description   Final    BLOOD LEFT ARM Performed at [facility] [address], [place], [place] [address]    Special Requests   Final    BOTTLES DRAWN AEROBIC AND ANAEROBIC Blood Culture adequate volume Performed at [facility] [address] [place], [place] [address]    Culture   Final    NO GROWTH 2 DAYS Performed at [facility] [address] [place], [place] [address]    Report  Status PENDING  Incomplete     Patient was seen and examined on the day of discharge and was found to be in stable condition. Time coordinating discharge: 25 minutes including assessment and coordination of care, as well as examination of the patient.   SIGNED:  Dessa Phi, DO [facility] [email or web address] 01/03/2019, 10:40 AM

## 2019-01-04 LAB — CULTURE, BLOOD (ROUTINE X 2)
Culture: NO GROWTH
Special Requests: ADEQUATE

## 2019-01-05 ENCOUNTER — Other Ambulatory Visit: Payer: Self-pay | Admitting: Hematology and Oncology

## 2019-01-05 ENCOUNTER — Telehealth: Payer: Self-pay | Admitting: *Deleted

## 2019-01-05 ENCOUNTER — Telehealth: Payer: Self-pay | Admitting: Oncology

## 2019-01-05 NOTE — Telephone Encounter (Signed)
Called Zoe Kim to see how she is feeling.  She said she just got up and has just started to move around.  Advised her that we will need to check labs tomorrow to see if she is OK to start radiation.  She said she is not sure she wants to start radiation tomorrow and would like to see if she can reschedule it for latter in the week.    Scheduled labs/flush for tomorrow 01/06/19 at 10:30. Also let Romie Jumper in Radiation know.  She will talk to Dr. Sondra Come about rescheduling for later this week.

## 2019-01-05 NOTE — Telephone Encounter (Signed)
Called Zoe Kim and advised her that if she is feeling ok, we can cancel her labs tomorrow and that they can be done on 01/13/19 as originally scheduled.  She said she is feeling a little better but is still very weak - can only walk about 20 feet before her heart starts pounding and she has to sit down.  She would like to keep the lab appointment tomorrow.

## 2019-01-05 NOTE — Telephone Encounter (Signed)
CALLED PATIENT TO INFORM THAT [facility] HAS BEEN MOVED TO 01-15-19, LVM FOR A RETURN CALL

## 2019-01-05 NOTE — Telephone Encounter (Signed)
CALLED PATIENT TO ASK QUESTION, SPOKE WITH PATIENT 

## 2019-01-06 ENCOUNTER — Telehealth: Payer: Self-pay | Admitting: Oncology

## 2019-01-06 ENCOUNTER — Inpatient Hospital Stay: Payer: PPO | Attending: Hematology and Oncology

## 2019-01-06 ENCOUNTER — Ambulatory Visit: Payer: PPO | Admitting: Radiation Oncology

## 2019-01-06 ENCOUNTER — Inpatient Hospital Stay: Payer: PPO

## 2019-01-06 ENCOUNTER — Other Ambulatory Visit: Payer: Self-pay

## 2019-01-06 DIAGNOSIS — R000 Tachycardia, unspecified: Secondary | ICD-10-CM | POA: Insufficient documentation

## 2019-01-06 DIAGNOSIS — T451X5A Adverse effect of antineoplastic and immunosuppressive drugs, initial encounter: Secondary | ICD-10-CM | POA: Diagnosis not present

## 2019-01-06 DIAGNOSIS — R5383 Other fatigue: Secondary | ICD-10-CM | POA: Insufficient documentation

## 2019-01-06 DIAGNOSIS — C541 Malignant neoplasm of endometrium: Secondary | ICD-10-CM | POA: Diagnosis not present

## 2019-01-06 DIAGNOSIS — R531 Weakness: Secondary | ICD-10-CM | POA: Insufficient documentation

## 2019-01-06 DIAGNOSIS — D6481 Anemia due to antineoplastic chemotherapy: Secondary | ICD-10-CM | POA: Insufficient documentation

## 2019-01-06 DIAGNOSIS — R197 Diarrhea, unspecified: Secondary | ICD-10-CM | POA: Diagnosis not present

## 2019-01-06 DIAGNOSIS — Z7689 Persons encountering health services in other specified circumstances: Secondary | ICD-10-CM | POA: Diagnosis not present

## 2019-01-06 DIAGNOSIS — R630 Anorexia: Secondary | ICD-10-CM | POA: Diagnosis not present

## 2019-01-06 DIAGNOSIS — Z79899 Other long term (current) drug therapy: Secondary | ICD-10-CM | POA: Insufficient documentation

## 2019-01-06 DIAGNOSIS — R509 Fever, unspecified: Secondary | ICD-10-CM | POA: Insufficient documentation

## 2019-01-06 DIAGNOSIS — Z791 Long term (current) use of non-steroidal anti-inflammatories (NSAID): Secondary | ICD-10-CM | POA: Insufficient documentation

## 2019-01-06 DIAGNOSIS — G479 Sleep disorder, unspecified: Secondary | ICD-10-CM | POA: Diagnosis not present

## 2019-01-06 DIAGNOSIS — Z5111 Encounter for antineoplastic chemotherapy: Secondary | ICD-10-CM | POA: Insufficient documentation

## 2019-01-06 DIAGNOSIS — G620 Drug-induced polyneuropathy: Secondary | ICD-10-CM | POA: Insufficient documentation

## 2019-01-06 LAB — CBC WITH DIFFERENTIAL (CANCER CENTER ONLY)
Abs Immature Granulocytes: 0.72 10*3/uL — ABNORMAL HIGH (ref 0.00–0.07)
Basophils Absolute: 0 10*3/uL (ref 0.0–0.1)
Basophils Relative: 0 %
Eosinophils Absolute: 0 10*3/uL (ref 0.0–0.5)
Eosinophils Relative: 1 %
HCT: 26.3 % — ABNORMAL LOW (ref 36.0–46.0)
Hemoglobin: 8.9 g/dL — ABNORMAL LOW (ref 12.0–15.0)
Immature Granulocytes: 10 %
Lymphocytes Relative: 19 %
Lymphs Abs: 1.4 10*3/uL (ref 0.7–4.0)
MCH: 30.6 pg (ref 26.0–34.0)
MCHC: 33.8 g/dL (ref 30.0–36.0)
MCV: 90.4 fL (ref 80.0–100.0)
Monocytes Absolute: 1.4 10*3/uL — ABNORMAL HIGH (ref 0.1–1.0)
Monocytes Relative: 18 %
Neutro Abs: 4 10*3/uL (ref 1.7–7.7)
Neutrophils Relative %: 52 %
Platelet Count: 125 10*3/uL — ABNORMAL LOW (ref 150–400)
RBC: 2.91 MIL/uL — ABNORMAL LOW (ref 3.87–5.11)
RDW: 16.3 % — ABNORMAL HIGH (ref 11.5–15.5)
WBC Count: 7.6 10*3/uL (ref 4.0–10.5)
nRBC: 0.3 % — ABNORMAL HIGH (ref 0.0–0.2)

## 2019-01-06 LAB — CMP (CANCER CENTER ONLY)
ALT: 21 U/L (ref 0–44)
AST: 23 U/L (ref 15–41)
Albumin: 3.8 g/dL (ref 3.5–5.0)
Alkaline Phosphatase: 139 U/L — ABNORMAL HIGH (ref 38–126)
Anion gap: 10 (ref 5–15)
BUN: 9 mg/dL (ref 8–23)
CO2: 25 mmol/L (ref 22–32)
Calcium: 9.3 mg/dL (ref 8.9–10.3)
Chloride: 104 mmol/L (ref 98–111)
Creatinine: 0.75 mg/dL (ref 0.44–1.00)
GFR, Est AFR Am: >60 mL/min (ref >60)
GFR, Estimated: >60 mL/min (ref >60)
Glucose, Bld: 99 mg/dL (ref 70–99)
Potassium: 3.9 mmol/L (ref 3.5–5.1)
Sodium: 139 mmol/L (ref 135–145)
Total Bilirubin: 0.3 mg/dL (ref 0.3–1.2)
Total Protein: 6.8 g/dL (ref 6.5–8.1)

## 2019-01-06 LAB — CULTURE, BLOOD (ROUTINE X 2)
Culture: NO GROWTH
Culture: NO GROWTH
Special Requests: ADEQUATE
Special Requests: ADEQUATE

## 2019-01-06 MED ORDER — SODIUM CHLORIDE 0.9% FLUSH
10.0000 mL | Freq: Once | INTRAVENOUS | Status: AC
  Administered 2019-01-06: 10 mL
  Filled 2019-01-06: qty 10

## 2019-01-06 MED ORDER — HEPARIN SOD (PORK) LOCK FLUSH 100 UNIT/ML IV SOLN
500.0000 [IU] | Freq: Once | INTRAVENOUS | Status: AC
  Administered 2019-01-06: 500 [IU]
  Filled 2019-01-06: qty 5

## 2019-01-06 NOTE — Telephone Encounter (Signed)
Called Zoe Kim with lab results from today which are improved per Dr. Alvy Bimler.  She verbalized agreement.  She also said she has not been able to get on to MyChart to see her appointments/results.  Helped her reset her password and advised her to call back is she has any problems logging on.

## 2019-01-07 ENCOUNTER — Ambulatory Visit: Payer: PPO | Admitting: Radiation Oncology

## 2019-01-09 ENCOUNTER — Other Ambulatory Visit: Payer: PPO

## 2019-01-13 ENCOUNTER — Other Ambulatory Visit: Payer: Self-pay

## 2019-01-13 ENCOUNTER — Inpatient Hospital Stay: Payer: PPO

## 2019-01-13 ENCOUNTER — Encounter: Payer: Self-pay | Admitting: Hematology and Oncology

## 2019-01-13 ENCOUNTER — Inpatient Hospital Stay (HOSPITAL_BASED_OUTPATIENT_CLINIC_OR_DEPARTMENT_OTHER): Payer: PPO | Admitting: Hematology and Oncology

## 2019-01-13 VITALS — HR 114

## 2019-01-13 DIAGNOSIS — D6481 Anemia due to antineoplastic chemotherapy: Secondary | ICD-10-CM

## 2019-01-13 DIAGNOSIS — Z79899 Other long term (current) drug therapy: Secondary | ICD-10-CM

## 2019-01-13 DIAGNOSIS — G479 Sleep disorder, unspecified: Secondary | ICD-10-CM

## 2019-01-13 DIAGNOSIS — R509 Fever, unspecified: Secondary | ICD-10-CM

## 2019-01-13 DIAGNOSIS — C541 Malignant neoplasm of endometrium: Secondary | ICD-10-CM | POA: Diagnosis not present

## 2019-01-13 DIAGNOSIS — T451X5A Adverse effect of antineoplastic and immunosuppressive drugs, initial encounter: Secondary | ICD-10-CM

## 2019-01-13 DIAGNOSIS — R5383 Other fatigue: Secondary | ICD-10-CM

## 2019-01-13 DIAGNOSIS — R630 Anorexia: Secondary | ICD-10-CM | POA: Diagnosis not present

## 2019-01-13 DIAGNOSIS — Z791 Long term (current) use of non-steroidal anti-inflammatories (NSAID): Secondary | ICD-10-CM | POA: Diagnosis not present

## 2019-01-13 DIAGNOSIS — R197 Diarrhea, unspecified: Secondary | ICD-10-CM | POA: Diagnosis not present

## 2019-01-13 DIAGNOSIS — G620 Drug-induced polyneuropathy: Secondary | ICD-10-CM | POA: Diagnosis not present

## 2019-01-13 DIAGNOSIS — R531 Weakness: Secondary | ICD-10-CM | POA: Diagnosis not present

## 2019-01-13 DIAGNOSIS — R000 Tachycardia, unspecified: Secondary | ICD-10-CM

## 2019-01-13 DIAGNOSIS — Z5111 Encounter for antineoplastic chemotherapy: Secondary | ICD-10-CM | POA: Diagnosis not present

## 2019-01-13 LAB — CBC WITH DIFFERENTIAL (CANCER CENTER ONLY)
Abs Immature Granulocytes: 0.04 10*3/uL (ref 0.00–0.07)
Basophils Absolute: 0 10*3/uL (ref 0.0–0.1)
Basophils Relative: 1 %
Eosinophils Absolute: 0 10*3/uL (ref 0.0–0.5)
Eosinophils Relative: 0 %
HCT: 26.7 % — ABNORMAL LOW (ref 36.0–46.0)
Hemoglobin: 9.2 g/dL — ABNORMAL LOW (ref 12.0–15.0)
Immature Granulocytes: 1 %
Lymphocytes Relative: 28 %
Lymphs Abs: 1.4 10*3/uL (ref 0.7–4.0)
MCH: 31 pg (ref 26.0–34.0)
MCHC: 34.5 g/dL (ref 30.0–36.0)
MCV: 89.9 fL (ref 80.0–100.0)
Monocytes Absolute: 0.6 10*3/uL (ref 0.1–1.0)
Monocytes Relative: 13 %
Neutro Abs: 2.8 10*3/uL (ref 1.7–7.7)
Neutrophils Relative %: 57 %
Platelet Count: 193 10*3/uL (ref 150–400)
RBC: 2.97 MIL/uL — ABNORMAL LOW (ref 3.87–5.11)
RDW: 17.2 % — ABNORMAL HIGH (ref 11.5–15.5)
WBC Count: 4.9 10*3/uL (ref 4.0–10.5)
nRBC: 0 % (ref 0.0–0.2)

## 2019-01-13 LAB — CMP (CANCER CENTER ONLY)
ALT: 18 U/L (ref 0–44)
AST: 29 U/L (ref 15–41)
Albumin: 4.1 g/dL (ref 3.5–5.0)
Alkaline Phosphatase: 124 U/L (ref 38–126)
Anion gap: 9 (ref 5–15)
BUN: 11 mg/dL (ref 8–23)
CO2: 24 mmol/L (ref 22–32)
Calcium: 9 mg/dL (ref 8.9–10.3)
Chloride: 102 mmol/L (ref 98–111)
Creatinine: 0.8 mg/dL (ref 0.44–1.00)
GFR, Est AFR Am: >60 mL/min (ref >60)
GFR, Estimated: >60 mL/min (ref >60)
Glucose, Bld: 101 mg/dL — ABNORMAL HIGH (ref 70–99)
Potassium: 3.9 mmol/L (ref 3.5–5.1)
Sodium: 135 mmol/L (ref 135–145)
Total Bilirubin: 0.9 mg/dL (ref 0.3–1.2)
Total Protein: 7.3 g/dL (ref 6.5–8.1)

## 2019-01-13 MED ORDER — SODIUM CHLORIDE 0.9 % IV SOLN
552.6000 mg | Freq: Once | INTRAVENOUS | Status: AC
  Administered 2019-01-13: 15:00:00 550 mg via INTRAVENOUS
  Filled 2019-01-13: qty 55

## 2019-01-13 MED ORDER — PALONOSETRON HCL INJECTION 0.25 MG/5ML
INTRAVENOUS | Status: AC
  Filled 2019-01-13: qty 5

## 2019-01-13 MED ORDER — SODIUM CHLORIDE 0.9 % IV SOLN
Freq: Once | INTRAVENOUS | Status: AC
  Administered 2019-01-13: 11:00:00 via INTRAVENOUS
  Filled 2019-01-13: qty 5

## 2019-01-13 MED ORDER — DIPHENHYDRAMINE HCL 50 MG/ML IJ SOLN
INTRAMUSCULAR | Status: AC
  Filled 2019-01-13: qty 1

## 2019-01-13 MED ORDER — PEGFILGRASTIM 6 MG/0.6ML ~~LOC~~ PSKT
PREFILLED_SYRINGE | SUBCUTANEOUS | Status: AC
  Filled 2019-01-13: qty 0.6

## 2019-01-13 MED ORDER — PEGFILGRASTIM 6 MG/0.6ML ~~LOC~~ PSKT
6.0000 mg | PREFILLED_SYRINGE | Freq: Once | SUBCUTANEOUS | Status: AC
  Administered 2019-01-13: 16:00:00 6 mg via SUBCUTANEOUS

## 2019-01-13 MED ORDER — FAMOTIDINE IN NACL 20-0.9 MG/50ML-% IV SOLN
20.0000 mg | Freq: Once | INTRAVENOUS | Status: AC
  Administered 2019-01-13: 10:00:00 20 mg via INTRAVENOUS

## 2019-01-13 MED ORDER — SODIUM CHLORIDE 0.9 % IV SOLN
Freq: Once | INTRAVENOUS | Status: AC
  Administered 2019-01-13: 10:00:00 via INTRAVENOUS
  Filled 2019-01-13: qty 250

## 2019-01-13 MED ORDER — HEPARIN SOD (PORK) LOCK FLUSH 100 UNIT/ML IV SOLN
500.0000 [IU] | Freq: Once | INTRAVENOUS | Status: AC | PRN
  Administered 2019-01-13: 500 [IU]
  Filled 2019-01-13: qty 5

## 2019-01-13 MED ORDER — SODIUM CHLORIDE 0.9 % IV SOLN
131.2500 mg/m2 | Freq: Once | INTRAVENOUS | Status: AC
  Administered 2019-01-13: 12:00:00 258 mg via INTRAVENOUS
  Filled 2019-01-13: qty 43

## 2019-01-13 MED ORDER — FAMOTIDINE IN NACL 20-0.9 MG/50ML-% IV SOLN
INTRAVENOUS | Status: AC
  Filled 2019-01-13: qty 50

## 2019-01-13 MED ORDER — SODIUM CHLORIDE 0.9% FLUSH
10.0000 mL | Freq: Once | INTRAVENOUS | Status: AC
  Administered 2019-01-13: 10 mL
  Filled 2019-01-13: qty 10

## 2019-01-13 MED ORDER — SODIUM CHLORIDE 0.9% FLUSH
10.0000 mL | INTRAVENOUS | Status: DC | PRN
  Administered 2019-01-13: 10 mL
  Filled 2019-01-13: qty 10

## 2019-01-13 MED ORDER — PALONOSETRON HCL INJECTION 0.25 MG/5ML
0.2500 mg | Freq: Once | INTRAVENOUS | Status: AC
  Administered 2019-01-13: 10:00:00 0.25 mg via INTRAVENOUS

## 2019-01-13 MED ORDER — DIPHENHYDRAMINE HCL 50 MG/ML IJ SOLN
25.0000 mg | Freq: Once | INTRAMUSCULAR | Status: AC
  Administered 2019-01-13: 25 mg via INTRAVENOUS

## 2019-01-13 NOTE — Progress Notes (Signed)
[facility] OFF[facility] PROGRESS NOTE  Patient Care Team: Zoe Agreste, MD as PCP - General (Family Medicine) Zoe Cantor, MD as Consulting Physician (Ophthalmology)  ASSESSMENT & PLAN:  Endometrial cancer [facility]) She had recent neutropenic fever We discussed the role of additional G-CSF support and she agreed She will proceed with chemotherapy as scheduled along with adjuvant radiation treatment  Anemia due to antineoplastic chemotherapy She is not symptomatic We will proceed with treatment without dose adjustment  Peripheral neuropathy due to chemotherapy ([facility]) Neuropathy is stable since recent dose adjustment We will monitor carefully  Tachycardia Likely related to recent bone marrow recovery Observe only We will proceed with treatment   No orders of the defined types were placed in this encounter.   INTERVAL HISTORY: Please see below for problem oriented charting. 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  The patient reports 6 months or maybe longer of intermittent vaginal spotting.  She was seen by Dr. 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    10/03/2018 Imaging   CT abdomen and pelvis 1. Enhancing soft tissue density in endometrial cavity, consistent with history of endometrial carcinoma. No evidence of extra uterine tumor extension or metastatic disease. 2. Tiny less than 1 cm anterior uterine fibroid. 3. Small hiatal hernia.  Aortic Atherosclerosis (ICD10-I70.0).   10/14/2018 Pathology Results   1. Lymph node, sentinel, biopsy, right external iliac - METASTATIC CARCINOMA PRESENT IN (1) OF (1) LYMPH NODE. 2. Lymph node, sentinel, biopsy, right deep obturator - ONE LYMPH NODE, NEGATIVE FOR CARCINOMA (0/1). 3. Lymph node, sentinel, biopsy, left external iliac - ONE LYMPH NODE, NEGATIVE FOR CARCINOMA (0/1). 4. Uterus +/- tubes/ovaries, neoplastic, cervix, bilateral fallopian tubes and ovaries - MIXED CELL CARCINOMA OF ENDOMETRIUM (MIXED ENDOMETRIOID CARCINOMA AND SEROUS CARCINOMA), WITH INVASION LESS THAN HALF OF THE MYOMETRIUM. - NO INVOLVEMENT OF UTERINE SEROSA, CERVICAL STROMA OR ADNEXA. - SEE ONCOLOGY TABLE. Microscopic Comment 4. UTERUS, CARCINOMA OR CARCINOSARCOMA Procedure: Total hysterectomy and bilateral salpingo-oophorectomy Histologic type: Mixed cell carcinoma (Endometrioid carcinoma 70% and Serous carcinoma 30%) Histologic Grade: Not applicable Myometrial Invasion: Present Depth of invasion: 6 mm Myometrial thickness: 17 mm Uterine Serosa Involvement: Not identified Cervical Stromal Involvement: Not identified Extent of Involvement of Other Organs: Not involved Lymphovascular Invasion: Present Regional Lymph Nodes: Examined: 3 Sentinel 0 Non-sentinel 3 Total Lymph nodes with metastases: 1 Isolated tumor cells (< 0.2 mm): 0 Micrometastasis (> 0.2 mm and < 2.0 mm): 0 Macrometastasis (> 2.0  mm): 1 Extracapsular extension: No definite ECE MMR/MSI Testing: Will be performed and the results reported separately Pathologic Stage Classification (pTNM, AJCC 8th edition): pT1a,  pN1a FIGO Stage: III-C1 Representative Tumor Block: 1-A Comment: The size of the metastatic deposit in the right external iliac sentinel lymph node is 32 mm. The metastatic deposit is virtually all serous carcinoma. Intradepartmental consultation was obtained   10/14/2018 Surgery   Pre-operative Diagnosis: endometrial cancer grade 3 (serous), obesity  Operation: Robotic-assisted laparoscopic total hysterectomy with bilateral salpingoophorectomy, SLN,  biopsy (22 modifier for obesity, BMI 32).  Surgeon: Zoe Kim  Operative Findings:  : 8cm arcuate uterus, normal appearing tubes and ovaries. Obesity with BMI 32. Narrow vaginal introitus.     10/17/2018 Cancer Staging   Staging form: Corpus Uteri - Carcinoma and Carcinosarcoma, AJCC 8th Edition - Pathologic: FIGO Stage IIIC1 (pT1, pN1a, cM0) - Signed by Zoe Lark, MD on 10/17/2018    Genetic Testing   Patient has genetic testing done for MMR. Results revealed patient has the following mutation(s): MMR: Normal    Genetic Testing   Patient has genetic testing done for MSI on pathology from 10/14/18. Results revealed patient has the following: MSI Stable   11/06/2018 Procedure   Placement of single lumen port a cath via right internal jugular vein. The catheter tip lies at the cavo-atrial junction. A power injectable port a cath was placed and is ready for immediate use.   11/10/2018 -  Chemotherapy   The patient had carboplatin and taxol for chemotherapy treatment.     11/10/2018 -  Chemotherapy   The patient had carboplatin and taxol for chemotherapy treatment.       REVIEW OF SYSTEMS:   Constitutional: Denies fevers, chills or abnormal weight loss Eyes: Denies blurriness of vision Ears, nose, mouth, throat, and face: Denies mucositis  or sore throat Respiratory: Denies cough, dyspnea or wheezes Cardiovascular: Denies palpitation, chest discomfort or lower extremity swelling Gastrointestinal:  Denies nausea, heartburn or change in bowel habits Skin: Denies abnormal skin rashes Lymphatics: Denies new lymphadenopathy or easy bruising Neurological:Denies numbness, tingling or new weaknesses Behavioral/Psych: Mood is stable, no new changes  All other systems were reviewed with the patient and are negative.  I have reviewed the past medical history, past surgical history, social history and family history with the patient and they are unchanged from previous note.  ALLERGIES:  is allergic to sulfa antibiotics.  MEDICATIONS:  Current Outpatient Medications  Medication Sig Dispense Refill  . acetaminophen (TYLENOL) 500 MG tablet Take 1,000 mg by mouth every 6 (six) hours as needed for moderate pain.    Marland Kitchen ibuprofen (ADVIL) 600 MG tablet Take 1 tablet (600 mg total) by mouth every 6 (six) hours as needed for moderate pain. For AFTER surgery 30 tablet 1  . MELATONIN PO Take 1 tablet by mouth at bedtime as needed (sleep).    . traMADol (ULTRAM) 50 MG tablet Take 1 tablet (50 mg total) by mouth every 6 (six) hours as needed. (Patient not taking: Reported on 12/30/2018) 30 tablet 0   No current facility-administered medications for this visit.    Facility-Administered Medications Ordered in Other Visits  Medication Dose Route Frequency Provider Last Rate Last Dose  . sodium chloride flush (NS) 0.9 % injection 10 mL  10 mL Intracatheter PRN Alvy Bimler, Lauriann Milillo, MD   10 mL at 01/13/19 1542    PHYSICAL EXAMINATION: ECOG PERFORMANCE STATUS: 1 - Symptomatic but completely ambulatory  Vitals:   01/13/19 0911  BP: 131/71  Pulse: (!) 116  Resp: 18  Temp: 98.2 F (36.8 C)  SpO2: 100%   Filed Weights   01/13/19 0911  Weight: 174 lb 3.2 oz (79 kg)    GENERAL:alert, no distress and comfortable SKIN: skin color, texture, turgor are  normal, no rashes or significant lesions EYES: normal, Conjunctiva are pink and non-injected, sclera clear OROPHARYNX:no exudate, no erythema and lips, buccal mucosa, and tongue normal  NECK: supple, thyroid normal size, non-tender, without nodularity LYMPH:  no palpable lymphadenopathy in the cervical, axillary or inguinal LUNGS: clear to auscultation and percussion with normal breathing effort HEART: Tachycardia with mild ejection systolic murmur on the left sternal border and no lower extremity edema ABDOMEN:abdomen soft, non-tender and normal bowel sounds Musculoskeletal:no cyanosis of digits and no clubbing  NEURO: alert & oriented x 3 with fluent speech, no focal motor/sensory deficits  LABORATORY DATA:  I have reviewed the data as listed    Component Value Date/Time   NA 135 01/13/2019 0845   NA 141 07/05/2016 1642   K 3.9 01/13/2019 0845   CL 102 01/13/2019 0845   CO2 24 01/13/2019 0845   GLUCOSE 101 (H) 01/13/2019 0845   BUN 11 01/13/2019 0845   BUN 10 07/05/2016 1642   CREATININE 0.80 01/13/2019 0845   CREATININE 0.86 05/22/2012 1415   CALCIUM 9.0 01/13/2019 0845   PROT 7.3 01/13/2019 0845   ALBUMIN 4.1 01/13/2019 0845   AST 29 01/13/2019 0845   ALT 18 01/13/2019 0845   ALKPHOS 124 01/13/2019 0845   BILITOT 0.9 01/13/2019 0845   GFRNONAA >60 01/13/2019 0845   GFRAA >60 01/13/2019 0845    No results found for: SPEP, UPEP  Lab Results  Component Value Date   WBC 4.9 01/13/2019   NEUTROABS 2.8 01/13/2019   HGB 9.2 (L) 01/13/2019   HCT 26.7 (L) 01/13/2019   MCV 89.9 01/13/2019   PLT 193 01/13/2019      Chemistry      Component Value Date/Time   NA 135 01/13/2019 0845   NA 141 07/05/2016 1642   K 3.9 01/13/2019 0845   CL 102 01/13/2019 0845   CO2 24 01/13/2019 0845   BUN 11 01/13/2019 0845   BUN 10 07/05/2016 1642   CREATININE 0.80 01/13/2019 0845   CREATININE 0.86 05/22/2012 1415      Component Value Date/Time   CALCIUM 9.0 01/13/2019 0845    ALKPHOS 124 01/13/2019 0845   AST 29 01/13/2019 0845   ALT 18 01/13/2019 0845   BILITOT 0.9 01/13/2019 0845       RADIOGRAPHIC STUDIES: I have personally reviewed the radiological images as listed and agreed with the findings in the report. Dg Chest Port 1 View  Result Date: 12/30/2018 CLINICAL DATA:  Per EMS: 73 y.o female hx of cervical cancer. Pt c/o of feeling "off" and different than normal compared to other chemo treatments since last chemo treatment last Tuesday. Pt c/o of tingling in hands and feet, weakness, loss of appetite, diarrhea, trouble sleeping, and fevers (100-101 degrees). EXAM: PORTABLE CHEST 1 VIEW COMPARISON:  07/05/2016 FINDINGS: Cardiac silhouette is normal size. No mediastinal or hilar masses. No evidence of adenopathy. Clear lungs. No pleural effusion or pneumothorax. Right anterior chest wall, internal jugular central venous catheter has its tip in the lower superior vena cava. Skeletal structures are grossly intact. IMPRESSION: No acute cardiopulmonary disease. Electronically Signed   By: Lajean Manes M.D.   On: 12/30/2018 17:44    All questions were answered. The patient knows to call the clinic with any problems, questions or concerns. No barriers to learning was detected.  I spent 25 minutes counseling the  patient face to face. The total time spent in the appointment was 30 minutes and more than 50% was on counseling and review of test results  Zoe Lark, MD 01/13/2019 5:16 PM

## 2019-01-13 NOTE — Patient Instructions (Signed)
[facility] Discharge Instructions for Patients Receiving Chemotherapy  Today you received the following chemotherapy agents Taxol, Carboplatin  To help prevent nausea and vomiting after your treatment, we encourage you to take your nausea medication as directed  If you develop nausea and vomiting that is not controlled by your nausea medication, call the clinic.   BELOW ARE SYMPTOMS THAT SHOULD BE REPORTED IMMEDIATELY:  *FEVER GREATER THAN 100.5 F  *CHILLS WITH OR WITHOUT FEVER  NAUSEA AND VOMITING THAT IS NOT CONTROLLED WITH YOUR NAUSEA MEDICATION  *UNUSUAL SHORTNESS OF BREATH  *UNUSUAL BRUISING OR BLEEDING  TENDERNESS IN MOUTH AND THROAT WITH OR WITHOUT PRESENCE OF ULCERS  *URINARY PROBLEMS  *BOWEL PROBLEMS  UNUSUAL RASH Items with * indicate a potential emergency and should be followed up as soon as possible.  Feel free to call the clinic should you have any questions or concerns. The clinic phone number is [phone].  Please show the CHEMO ALERT CARD at check-in to the Emergency Department and triage nurse.   

## 2019-01-13 NOTE — Assessment & Plan Note (Signed)
Neuropathy is stable since recent dose adjustment We will monitor carefully

## 2019-01-13 NOTE — Assessment & Plan Note (Signed)
She is not symptomatic We will proceed with treatment without dose adjustment

## 2019-01-13 NOTE — Assessment & Plan Note (Signed)
She had recent neutropenic fever We discussed the role of additional G-CSF support and she agreed She will proceed with chemotherapy as scheduled along with adjuvant radiation treatment

## 2019-01-13 NOTE — Assessment & Plan Note (Signed)
Likely related to recent bone marrow recovery Observe only We will proceed with treatment

## 2019-01-14 ENCOUNTER — Ambulatory Visit: Payer: PPO | Admitting: Radiation Oncology

## 2019-01-14 ENCOUNTER — Telehealth: Payer: Self-pay | Admitting: *Deleted

## 2019-01-14 NOTE — Telephone Encounter (Signed)
CALLED PATIENT TO REMIND OF NEW HDR [place] FOR 01-15-19, LVM FOR A RETURN CALL

## 2019-01-15 ENCOUNTER — Ambulatory Visit
Admission: RE | Admit: 2019-01-15 | Discharge: 2019-01-15 | Disposition: A | Discharge status: Home / Self Care | Payer: PPO | Source: Ambulatory Visit | Attending: Radiation Oncology | Admitting: Radiation Oncology

## 2019-01-15 ENCOUNTER — Encounter: Payer: Self-pay | Admitting: Radiation Oncology

## 2019-01-15 ENCOUNTER — Other Ambulatory Visit: Payer: Self-pay

## 2019-01-15 VITALS — BP 121/72 | HR 113 | Temp 98.2°F | Resp 18 | Ht 64.0 in

## 2019-01-15 DIAGNOSIS — R5383 Other fatigue: Secondary | ICD-10-CM | POA: Diagnosis not present

## 2019-01-15 DIAGNOSIS — C541 Malignant neoplasm of endometrium: Secondary | ICD-10-CM

## 2019-01-15 DIAGNOSIS — R197 Diarrhea, unspecified: Secondary | ICD-10-CM

## 2019-01-15 DIAGNOSIS — Z510 Encounter for antineoplastic radiation therapy: Secondary | ICD-10-CM | POA: Diagnosis not present

## 2019-01-15 DIAGNOSIS — Z923 Personal history of irradiation: Secondary | ICD-10-CM | POA: Diagnosis not present

## 2019-01-15 DIAGNOSIS — R300 Dysuria: Secondary | ICD-10-CM | POA: Diagnosis not present

## 2019-01-15 MED ORDER — DIPHENOXYLATE-ATROPINE 2.5-0.025 MG PO TABS
2.0000 | ORAL_TABLET | Freq: Once | ORAL | Status: AC
  Administered 2019-01-15: 09:00:00 2 via ORAL
  Filled 2019-01-15: qty 2

## 2019-01-15 NOTE — Progress Notes (Signed)
Radiation Oncology         [phone] ________________________________  Name: Zoe Kim MRN: [id number]  Date: 01/15/2019  DOB: 1946-03-23  Vaginal Brachytherapy Procedure Note  CC: [name], MD [name], MD    ICD-10-CM   1. Diarrhea, unspecified type  R19.7 diphenoxylate-atropine (LOMOTIL) 2.5-0.025 MG per tablet 2 tablet  2. Endometrial cancer ([facility])  C54.1     Diagnosis: 73 y.o. 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 Since initial consultation, the patient began adjuvant chemotherapy on 11/10/2018. She has undergone 4 cycles of carboplatin and Taxol, with her most recent dose given 2 days ago.  On review of systems, the patient reports "weariness". She feels as though she could develop diarrhea today. She denies any pain. She denies dysuria or hematuria. She denies vaginal bleeding or discharge.   ALLERGIES: is allergic to sulfa antibiotics.  Meds: Current Outpatient Medications  Medication Sig Dispense Refill  . acetaminophen (TYLENOL) 500 MG tablet Take 1,000 mg by mouth every 6 (six) hours as needed for moderate pain.    Marland Kitchen ibuprofen (ADVIL) 600 MG tablet Take 1 tablet (600 mg total) by mouth every 6 (six) hours as needed for moderate pain. For AFTER surgery 30 tablet 1  . MELATONIN PO Take 1 tablet by mouth at bedtime as needed (sleep).    . traMADol (ULTRAM) 50 MG tablet Take 1 tablet (50 mg total) by mouth every 6 (six) hours as needed. 30 tablet 0   No current facility-administered medications for this encounter.     Physical Findings:  The patient is in no acute distress. Patient is alert and oriented.  height is 5\' 4"  (1.626 m). Her temporal temperature is 98.2 F (36.8 C). Her blood pressure is 121/72 and her pulse is 113 (abnormal). Her respiration is 18 and oxygen saturation is 99%.   No palpable cervical, supraclavicular or axillary lymphoadenopathy. The heart has a regular rate and rhythm. The lungs are clear to auscultation. Abdomen soft and non-tender.  On pelvic examination the external genitalia were unremarkable. A speculum exam was performed. Vaginal cuff intact, no mucosal lesions. On bimanual exam there were no pelvic masses appreciated.  Lab Findings: Lab Results  Component Value Date   WBC 4.9 01/13/2019   HGB 9.2 (L) 01/13/2019   HCT 26.7 (L) 01/13/2019   MCV 89.9 01/13/2019   PLT 193 01/13/2019    Radiographic Findings: Dg Chest Port 1 View  Result Date: 12/30/2018 CLINICAL DATA:  Per EMS: 73 y.o female hx of cervical cancer. Pt c/o of feeling "off" and different than normal compared to other chemo treatments since last chemo treatment last Tuesday. Pt c/o of tingling in hands and feet, weakness, loss of appetite, diarrhea, trouble sleeping, and fevers (100-101 degrees). EXAM: PORTABLE CHEST 1 VIEW COMPARISON:  07/05/2016 FINDINGS: Cardiac silhouette is normal size. No mediastinal or hilar masses. No evidence of adenopathy. Clear lungs. No pleural effusion or pneumothorax. Right anterior chest wall, internal jugular central venous catheter has its tip in the lower superior vena cava. Skeletal structures are grossly intact. IMPRESSION: No acute cardiopulmonary disease. Electronically Signed   By: Shanon Brow  Ormond M.D.   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  Patient was fitted for a vaginal cylinder. The patient will be treated with a 2.5 cm  diameter cylinder with a treatment length of 3.0 cm. This distended the vaginal vault without undue discomfort. The patient tolerated the procedure well.  The patient was successfully fitted for a vaginal cylinder. The patient is appropriate to begin vaginal brachytherapy.   Plan: The patient will proceed with CT simulation and vaginal brachytherapy today.    _______________________________   Blair Promise, PhD, MD  This document serves as a record of services personally performed by Gery Pray, MD. It was created on his behalf by Rae Lips, a trained medical scribe. The creation of this record is based on the scribe's personal observations and the provider's statements to them. This document has been checked and approved by the attending provider.

## 2019-01-15 NOTE — Patient Instructions (Signed)
Coronavirus (COVID-19) Are you at risk?  Are you at risk for the Coronavirus (COVID-19)?  To be considered HIGH RISK for Coronavirus (COVID-19), you have to meet the following criteria:  . Traveled to [place], [place], [place], [place] or [place]; or in the [place] to [place], [place], [place], or [place]; and have fever, cough, and shortness of breath within the last 2 weeks of travel OR . Been in close contact with a person diagnosed with COVID-19 within the last 2 weeks and have fever, cough, and shortness of breath . IF YOU DO NOT MEET THESE CRITERIA, YOU ARE CONSIDERED LOW RISK FOR COVID-19.  What to do if you are HIGH RISK for COVID-19?  . If you are having a medical emergency, call 911. . Seek medical care right away. Before you go to a doctor's office, urgent care or emergency department, call ahead and tell them about your recent travel, contact with someone diagnosed with COVID-19, and your symptoms. You should receive instructions from your physician's office regarding next steps of care.  . When you arrive at healthcare provider, tell the healthcare staff immediately you have returned from visiting [place], [place], [place], [place] or [place]; or traveled in the [place] to [place], [place], [place], or [place]; in the last two weeks or you have been in close contact with a person diagnosed with COVID-19 in the last 2 weeks.   . Tell the health care staff about your symptoms: fever, cough and shortness of breath. . After you have been seen by a medical provider, you will be either: o Tested for (COVID-19) and discharged home on quarantine except to seek medical care if symptoms worsen, and asked to  - Stay home and avoid contact with others until you get your results (4-5 days)  - Avoid travel on public transportation if possible (such as bus, train, or airplane) or o Sent to the Emergency Department by EMS for evaluation, COVID-19 testing, and possible  admission depending on your condition and test results.  What to do if you are LOW RISK for COVID-19?  Reduce your risk of any infection by using the same precautions used for avoiding the common cold or flu:  . Wash your hands often with soap and warm water for at least 20 seconds.  If soap and water are not readily available, use an alcohol-based hand sanitizer with at least 60% alcohol.  . If coughing or sneezing, cover your mouth and nose by coughing or sneezing into the elbow areas of your shirt or coat, into a tissue or into your sleeve (not your hands). . Avoid shaking hands with others and consider head nods or verbal greetings only. . Avoid touching your eyes, nose, or mouth with unwashed hands.  . Avoid close contact with people who are sick. . Avoid places or events with large numbers of people in one location, like concerts or sporting events. . Carefully consider travel plans you have or are making. . If you are planning any travel outside or inside the [place], visit the CDC's Travelers' Health webpage for the latest health notices. . If you have some symptoms but not all symptoms, continue to monitor at home and seek medical attention if your symptoms worsen. . If you are having a medical emergency, call 911.   ADDITIONAL HEALTHCARE OPTIONS FOR PATIENTS  [facility] / e-Visit: [email or web address]         [facility]: [phone]  [facility]: [phone]                   [facility]: [phone]   

## 2019-01-15 NOTE — Progress Notes (Signed)
  Radiation Oncology         [phone] ________________________________  Name: Zoe Kim MRN: [id number]  Date: 01/15/2019  DOB: 1946/04/23  SIMULATION AND TREATMENT PLANNING NOTE HDR BRACHYTHERAPY  DIAGNOSIS:  73 y.o. 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  Identity was confirmed.  All relevant records and images related to the planned course of therapy were reviewed.  The patient freely provided informed written consent to proceed with treatment after reviewing the details related to the planned course of therapy. The consent form was witnessed and verified by the simulation staff.  Then, the patient was set-up in a stable reproducible  supine position for radiation therapy.  CT images were obtained.  Surface markings were placed.  The CT images were loaded into the planning software.  Then the target and avoidance structures were contoured.  Treatment planning then occurred.  The radiation prescription was entered and confirmed.   I have requested : Brachytherapy Isodose Plan and Dosimetry Calculations to plan the radiation distribution.    PLAN:  The patient will receive 30 Gy in 5 fractions. Iridium-192 will be the high-dose-rate source. The patient will be treated with a 2.5 cm diameter cylinder with a treatment length of 3.0 cm.    ________________________________  Blair Promise, PhD, MD  This document serves as a record of services personally performed by Gery Pray, MD. It was created on his behalf by Rae Lips, a trained medical scribe. The creation of this record is based on the scribe's personal observations and the provider's statements to them. This document has been checked and approved by the attending provider.

## 2019-01-15 NOTE — Progress Notes (Signed)
  Radiation Oncology         [phone] ________________________________  Name: Zoe Kim MRN: [id number]  Date: 01/15/2019  DOB: May 30, 1946  CC: [name], MD  [name], MD  HDR BRACHYTHERAPY NOTE  DIAGNOSIS: 73 y.o. 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 She will be treated with a 2.5 cm diameter segmented cylinder. This conforms to her anatomy without undue discomfort.  Vaginal brachytherapy procedure node: The patient was brought to the [facility] suite. Identity was confirmed. All relevant records and images related to the planned course of therapy were reviewed. The patient freely provided informed written consent to proceed with treatment after reviewing the details related to the planned course of therapy. The consent form was witnessed and verified by the simulation staff. Then, the patient was set-up in a stable reproducible supine position for radiation therapy. Pelvic exam revealed the vaginal cuff to be intact. The patient's custom vaginal cylinder was placed in the proximal vagina. This was affixed to the CT/MR stabilization plate to prevent slippage. Patient tolerated the placement well.  Verification simulation note:  A fiducial marker was placed within the vaginal cylinder. An AP and lateral film was then obtained through the pelvis area. This documented accurate position of the vaginal cylinder for treatment.  HDR BRACHYTHERAPY TREATMENT  The remote afterloading device was affixed to the vaginal cylinder by catheter. Patient then proceeded to undergo her first high-dose-rate treatment directed at the proximal vagina. The patient was prescribed a dose of 6.0 gray to be delivered to the mucosal surface. Treatment length was 3.0  cm. Patient was treated with 1 channel using 7 dwell positions. Treatment time was 191.40 seconds. Iridium 192 was the high-dose-rate source for treatment. The patient tolerated the treatment well. After completion of her therapy, a radiation survey was performed documenting return of the iridium source into the GammaMed safe.   PLAN: The patient will return on 01/21/2019 for her second HDR treatment. ________________________________  Blair Promise, PhD, MD   This document serves as a record of services personally performed by Gery Pray, MD. It was created on his behalf by Rae Lips, a trained medical scribe. The creation of this record is based on the scribe's personal observations and the provider's statements to them. This document has been checked and approved by the attending provider.

## 2019-01-15 NOTE — Progress Notes (Signed)
Pt presents today for new [place] HDR. Pt denies c/o pain but does state "weariness". Pt denies dysuria/hematuria. Pt denies vaginal bleeding/discharge. Pt feels as though she could develop diarrhea today. Pt receives chemotherapy every 3 weeks, with most recent dose this week.  BP 121/72 (BP Location: Left Arm, Patient Position: Sitting)   Pulse (!) 113   Temp 98.2 F (36.8 C) (Temporal)   Resp 18   Ht 5\' 4"  (1.626 m)   SpO2 99%   BMI 29.90 kg/m   Wt Readings from Last 3 Encounters:  01/13/19 174 lb 3.2 oz (79 kg)  01/01/19 177 lb 7.5 oz (80.5 kg)  12/23/18 179 lb 3.2 oz (81.3 kg)   Loma Sousa, RN BSN

## 2019-01-16 ENCOUNTER — Telehealth: Payer: Self-pay | Admitting: Licensed Clinical Social Worker

## 2019-01-20 ENCOUNTER — Telehealth: Payer: Self-pay | Admitting: *Deleted

## 2019-01-20 NOTE — Telephone Encounter (Signed)
CALLED PATIENT TO REMIND OF 2 PM HDR TX. FOR 01-21-19, LVM FOR A RETURN CALL

## 2019-01-20 NOTE — Telephone Encounter (Signed)
CALLED PATIENT TO INFORM THAT SHE NEEDS TO COME IN @ 1 PM ON 01-21-19 FOR LAB, LVM FOR A RETURN CALL

## 2019-01-21 ENCOUNTER — Ambulatory Visit
Admission: RE | Admit: 2019-01-21 | Discharge: 2019-01-21 | Disposition: A | Discharge status: Home / Self Care | Payer: PPO | Source: Ambulatory Visit | Attending: Radiation Oncology | Admitting: Radiation Oncology

## 2019-01-21 ENCOUNTER — Emergency Department (HOSPITAL_COMMUNITY)
Admission: EM | Admit: 2019-01-21 | Discharge: 2019-01-21 | Disposition: A | Discharge status: Home / Self Care | Payer: PPO | Attending: Emergency Medicine | Admitting: Emergency Medicine

## 2019-01-21 ENCOUNTER — Other Ambulatory Visit: Payer: Self-pay

## 2019-01-21 ENCOUNTER — Encounter: Payer: Self-pay | Admitting: Gynecologic Oncology

## 2019-01-21 ENCOUNTER — Encounter (HOSPITAL_COMMUNITY): Payer: Self-pay

## 2019-01-21 DIAGNOSIS — C541 Malignant neoplasm of endometrium: Secondary | ICD-10-CM | POA: Diagnosis not present

## 2019-01-21 DIAGNOSIS — Z96651 Presence of right artificial knee joint: Secondary | ICD-10-CM | POA: Diagnosis not present

## 2019-01-21 DIAGNOSIS — D649 Anemia, unspecified: Secondary | ICD-10-CM | POA: Insufficient documentation

## 2019-01-21 DIAGNOSIS — R300 Dysuria: Secondary | ICD-10-CM

## 2019-01-21 DIAGNOSIS — Z8542 Personal history of malignant neoplasm of other parts of uterus: Secondary | ICD-10-CM | POA: Insufficient documentation

## 2019-01-21 DIAGNOSIS — E860 Dehydration: Secondary | ICD-10-CM | POA: Diagnosis not present

## 2019-01-21 DIAGNOSIS — R5383 Other fatigue: Secondary | ICD-10-CM | POA: Diagnosis not present

## 2019-01-21 DIAGNOSIS — R531 Weakness: Secondary | ICD-10-CM | POA: Diagnosis not present

## 2019-01-21 DIAGNOSIS — Z510 Encounter for antineoplastic radiation therapy: Secondary | ICD-10-CM | POA: Diagnosis not present

## 2019-01-21 LAB — URINALYSIS, ROUTINE W REFLEX MICROSCOPIC
Bilirubin Urine: NEGATIVE
Glucose, UA: NEGATIVE mg/dL
Ketones, ur: NEGATIVE mg/dL
Leukocytes,Ua: NEGATIVE
Nitrite: NEGATIVE
Protein, ur: NEGATIVE mg/dL
Specific Gravity, Urine: 1.005 (ref 1.005–1.030)
pH: 6 (ref 5.0–8.0)

## 2019-01-21 LAB — CMP (CANCER CENTER ONLY)
ALT: 19 U/L (ref 0–44)
AST: 25 U/L (ref 15–41)
Albumin: 4 g/dL (ref 3.5–5.0)
Alkaline Phosphatase: 136 U/L — ABNORMAL HIGH (ref 38–126)
Anion gap: 14 (ref 5–15)
BUN: 9 mg/dL (ref 8–23)
CO2: 22 mmol/L (ref 22–32)
Calcium: 8.9 mg/dL (ref 8.9–10.3)
Chloride: 99 mmol/L (ref 98–111)
Creatinine: 0.89 mg/dL (ref 0.44–1.00)
GFR, Est AFR Am: >60 mL/min (ref >60)
GFR, Estimated: >60 mL/min (ref >60)
Glucose, Bld: 102 mg/dL — ABNORMAL HIGH (ref 70–99)
Potassium: 3.4 mmol/L — ABNORMAL LOW (ref 3.5–5.1)
Sodium: 135 mmol/L (ref 135–145)
Total Bilirubin: 0.3 mg/dL (ref 0.3–1.2)
Total Protein: 6.6 g/dL (ref 6.5–8.1)

## 2019-01-21 LAB — CBC WITH DIFFERENTIAL (CANCER CENTER ONLY)
Abs Immature Granulocytes: 1.22 10*3/uL — ABNORMAL HIGH (ref 0.00–0.07)
Basophils Absolute: 0.1 10*3/uL (ref 0.0–0.1)
Basophils Relative: 1 %
Eosinophils Absolute: 0.1 10*3/uL (ref 0.0–0.5)
Eosinophils Relative: 1 %
HCT: 23 % — ABNORMAL LOW (ref 36.0–46.0)
Hemoglobin: 7.8 g/dL — ABNORMAL LOW (ref 12.0–15.0)
Immature Granulocytes: 11 %
Lymphocytes Relative: 15 %
Lymphs Abs: 1.8 10*3/uL (ref 0.7–4.0)
MCH: 31.7 pg (ref 26.0–34.0)
MCHC: 33.9 g/dL (ref 30.0–36.0)
MCV: 93.5 fL (ref 80.0–100.0)
Monocytes Absolute: 3 10*3/uL — ABNORMAL HIGH (ref 0.1–1.0)
Monocytes Relative: 26 %
Neutro Abs: 5.3 10*3/uL (ref 1.7–7.7)
Neutrophils Relative %: 46 %
Platelet Count: 160 10*3/uL (ref 150–400)
RBC: 2.46 MIL/uL — ABNORMAL LOW (ref 3.87–5.11)
RDW: 16.8 % — ABNORMAL HIGH (ref 11.5–15.5)
WBC Count: 11.5 10*3/uL — ABNORMAL HIGH (ref 4.0–10.5)
nRBC: 0.3 % — ABNORMAL HIGH (ref 0.0–0.2)

## 2019-01-21 MED ORDER — HEPARIN SOD (PORK) LOCK FLUSH 100 UNIT/ML IV SOLN
INTRAVENOUS | Status: AC
  Administered 2019-01-21: 22:00:00
  Filled 2019-01-21: qty 5

## 2019-01-21 MED ORDER — SODIUM CHLORIDE 0.9 % IV BOLUS
500.0000 mL | Freq: Once | INTRAVENOUS | Status: AC
  Administered 2019-01-21: 19:00:00 500 mL via INTRAVENOUS

## 2019-01-21 MED ORDER — SODIUM CHLORIDE 0.9 % IV BOLUS
500.0000 mL | Freq: Once | INTRAVENOUS | Status: AC
  Administered 2019-01-21: 500 mL via INTRAVENOUS

## 2019-01-21 NOTE — Progress Notes (Addendum)
  Radiation Oncology         [phone] ________________________________  Name: Zoe Kim MRN: [id number]  Date: 01/21/2019  DOB: July 25, 1945  CC: [name], MD  [name], MD  HDR BRACHYTHERAPY NOTE  DIAGNOSIS: 73 y.o. 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 She will be treated with a 2.5 cm diameter segmented cylinder. This conforms to her anatomy without undue discomfort.  Vaginal brachytherapy procedure node: The patient was brought to the [facility] suite. Identity was confirmed. All relevant records and images related to the planned course of therapy were reviewed. The patient freely provided informed written consent to proceed with treatment after reviewing the details related to the planned course of therapy. The consent form was witnessed and verified by the simulation staff. Then, the patient was set-up in a stable reproducible supine position for radiation therapy. Pelvic exam revealed the vaginal cuff to be intact. The patient's custom vaginal cylinder was placed in the proximal vagina. This was affixed to the CT/MR stabilization plate to prevent slippage. Patient tolerated the placement well.  Verification simulation note:  A fiducial marker was placed within the vaginal cylinder. An AP and lateral film was then obtained through the pelvis area. This documented accurate position of the vaginal cylinder for treatment.  HDR BRACHYTHERAPY TREATMENT  The remote afterloading device was affixed to the vaginal cylinder by catheter. Patient then proceeded to undergo her second high-dose-rate treatment directed at the proximal vagina. The patient was prescribed a dose of 6.0 gray to be delivered to the mucosal surface. Treatment length was 3.0  cm. Patient was treated with 1 channel using 7 dwell positions. Treatment time was 202.5 seconds. Iridium 192 was the high-dose-rate source for treatment. The patient tolerated the treatment well. After completion of her therapy, a radiation survey was performed documenting return of the iridium source into the GammaMed safe.   PLAN: The patient will return on 01/28/2019 for her third HDR treatment.  Patient reported significant fatigue upon presentation today.  She underwent additional chemotherapy several days ago. she also reported no urine output for the past 48 hours despite consuming lots of fluids.  While in  radiation oncology department the patient had a bladder catheterization and surprisingly only a few cc of dark-colored urine was obtained..  The Foley catheter was subsequently removed urine has been sent for urinalysis culture and sensitivity.  In addition stat labs have been obtained.  Symptom management in  medical oncology was consulted and given the patient's recent administration of chemotherapy and significantly decreased urine output was recommended she be transported to the emergency room for further evaluation. ________________________________  Blair Promise, PhD, MD   This document serves as a record of services personally performed by Gery Pray, MD. It was created on his behalf by Rae Lips, a trained medical scribe. The creation of this record is based on the scribe's personal observations and the provider's statements to them. This document has been checked and approved by the attending provider.

## 2019-01-21 NOTE — ED Triage Notes (Signed)
Pt brought over from [facility] radiology where she was receiving brachytherapry for 2 seconds. Pt reports no urination x2 days. Pt has had severe fatigue and decreased appetite.   Last chemo Aug 11.

## 2019-01-21 NOTE — ED Notes (Signed)
Pt in room waiting on husband to pick her up

## 2019-01-21 NOTE — ED Provider Notes (Signed)
[facility] DEPT Provider Note   CSN: [id number] Arrival date & time: 01/21/19  1546    History   Chief Complaint Chief Complaint  Patient presents with  . Fatigue    HPI BETZAYDA BRAXTON is a 73 y.o. female.     HPI Patient is being treated for endometrial cancer.  Over the last few days she has had decreased appetite, generalized fatigue, dyspnea with exertion.  She initially had urinary frequency and now decreased urination.  Denies cough, fever or chills.  No vomiting or diarrhea. Past Medical History:  Diagnosis Date  . Anxiety   . Arthritis   . Depression   . Endometrial ca ([place])   . Family history of breast cancer   . Family history of lung cancer   . Heart murmur    "years ago"  . Insomnia   . Pneumonia    "long time ago"  . Primary localized osteoarthrosis of the knee, right   . Urinary urgency     Patient Active Problem List   Diagnosis Date Noted  . Tachycardia 01/13/2019  . Neutropenic fever ([place]) 12/30/2018  . Anemia due to antineoplastic chemotherapy 12/24/2018  . Mild depression ([place]) 12/03/2018  . Other constipation 11/20/2018  . Peripheral neuropathy due to chemotherapy ([place]) 11/20/2018  . Family history of breast cancer   . Family history of lung cancer   . Bone pain 11/17/2018  . Dysuria 10/22/2018  . Obesity 10/14/2018  . Endometrial cancer ([place]) 10/01/2018  . Primary localized osteoarthritis of right knee 07/30/2016  . Anxiety   . Primary localized osteoarthrosis of the knee, right   . Urinary urgency     Past Surgical History:  Procedure Laterality Date  . COLONOSCOPY    . EYE SURGERY Bilateral    cataract with lens  . IR IMAGING GUIDED PORT INSERTION  11/06/2018  . ROBOTIC ASSISTED TOTAL HYSTERECTOMY WITH BILATERAL SALPINGO OOPHERECTOMY N/A 10/14/2018   Procedure: ROBOTIC ASSISTED TOTAL HYSTERECTOMY WITH BILATERAL SALPINGO OOPHORECTOMY;  Surgeon: Everitt Amber, MD;  Location: [facility] ORS;  Service:  Gynecology;  Laterality: N/A;  . SENTINEL NODE BIOPSY N/A 10/14/2018   Procedure: SENTINEL LYMPH NODE BIOPSY;  Surgeon: Everitt Amber, MD;  Location: [facility] ORS;  Service: Gynecology;  Laterality: N/A;  . TONSILLECTOMY    . TOOTH EXTRACTION Right 08/27/2017  . TOTAL KNEE ARTHROPLASTY Right 07/30/2016   Procedure: TOTAL KNEE ARTHROPLASTY;  Surgeon: Elsie Saas, MD;  Location: [facility];  Service: Orthopedics;  Laterality: Right;  . VEIN LIGATION AND STRIPPING       OB History    Gravida  1   Para  0   Term      Preterm      AB  1   Living  0     SAB      TAB      Ectopic      Multiple      Live Births               Home Medications    Prior to Admission medications   Medication Sig Start Date End Date Taking? Authorizing Provider  acetaminophen (TYLENOL) 500 MG tablet Take 1,000 mg by mouth every 6 (six) hours as needed for moderate pain.   Yes [provider]  ibuprofen (ADVIL) 600 MG tablet Take 1 tablet (600 mg total) by mouth every 6 (six) hours as needed for moderate pain. For AFTER surgery 10/01/18  Yes Dorothyann Gibbs, NP  MELATONIN  PO Take 1 tablet by mouth at bedtime as needed (sleep).   Yes [provider]  traMADol (ULTRAM) 50 MG tablet Take 1 tablet (50 mg total) by mouth every 6 (six) hours as needed. 11/17/18  Yes Heath Lark, MD    Family History Family History  Problem Relation Age of Onset  . Heart disease Mother   . COPD Mother   . Cancer Mother        uterine (possibly, pt unsure)  . Breast cancer Mother        dx in 51s  . Heart disease Father   . COPD Brother   . Heart disease Brother   . Cancer Brother 73       lung ca, smoker  . Cancer Maternal Grandmother        breast dx early 32s  . Stroke Maternal Grandfather     Social History Social History   Tobacco Use  . Smoking status: Never Smoker  . Smokeless tobacco: Never Used  . Tobacco comment: smoked 2 weeks in college  Substance Use Topics  . Alcohol use: No   . Drug use: No     Allergies   Sulfa antibiotics   Review of Systems Review of Systems  Constitutional: Positive for appetite change and fatigue. Negative for chills and fever.  HENT: Negative for sore throat and trouble swallowing.   Eyes: Negative for visual disturbance.  Respiratory: Positive for shortness of breath. Negative for cough.   Cardiovascular: Negative for chest pain, palpitations and leg swelling.  Gastrointestinal: Negative for abdominal pain, constipation, diarrhea, nausea and vomiting.  Genitourinary: Positive for frequency. Negative for flank pain.  Musculoskeletal: Negative for back pain, myalgias and neck pain.  Skin: Negative for rash and wound.  Neurological: Positive for weakness. Negative for dizziness, light-headedness, numbness and headaches.  All other systems reviewed and are negative.    Physical Exam Updated Vital Signs BP 112/70   Pulse 100   Temp 98 F (36.7 C) (Oral)   Resp 17   Ht 5\' 4"  (1.626 m)   Wt 79 kg   SpO2 98%   BMI 29.90 kg/m   Physical Exam Vitals signs and nursing note reviewed.  Constitutional:      General: She is not in acute distress.    Appearance: Normal appearance. She is well-developed.  HENT:     Head: Normocephalic and atraumatic.     Nose: Nose normal.     Mouth/Throat:     Mouth: Mucous membranes are moist.     Pharynx: No oropharyngeal exudate.  Eyes:     Pupils: Pupils are equal, round, and reactive to light.  Neck:     Musculoskeletal: Normal range of motion and neck supple. No neck rigidity or muscular tenderness.  Cardiovascular:     Rate and Rhythm: Normal rate and regular rhythm.     Heart sounds: No murmur. No friction rub. No gallop.   Pulmonary:     Effort: Pulmonary effort is normal. No respiratory distress.     Breath sounds: Normal breath sounds. No stridor. No wheezing, rhonchi or rales.  Chest:     Chest wall: No tenderness.  Abdominal:     General: Bowel sounds are normal. There is  no distension.     Palpations: Abdomen is soft. There is no mass.     Tenderness: There is no abdominal tenderness. There is no right CVA tenderness, left CVA tenderness, guarding or rebound.     Hernia: No hernia  is present.  Musculoskeletal: Normal range of motion.        General: No swelling, tenderness, deformity or signs of injury.     Right lower leg: No edema.     Left lower leg: No edema.  Lymphadenopathy:     Cervical: No cervical adenopathy.  Skin:    General: Skin is warm and dry.     Capillary Refill: Capillary refill takes less than 2 seconds.     Findings: No erythema or rash.  Neurological:     General: No focal deficit present.     Mental Status: She is alert and oriented to person, place, and time.     Comments: Moving all extremities without focal deficit.  Sensation intact.  Psychiatric:        Behavior: Behavior normal.      ED Treatments / Results  Labs (all labs ordered are listed, but only abnormal results are displayed) Labs Reviewed  URINALYSIS, ROUTINE W REFLEX MICROSCOPIC - Abnormal; Notable for the following components:      Result Value   Hgb urine dipstick SMALL (*)    Bacteria, UA FEW (*)    All other components within normal limits    EKG EKG Interpretation  Date/Time:  Wednesday January 21 2019 15:49:46 EDT Ventricular Rate:  102 PR Interval:    QRS Duration: 84 QT Interval:  354 QTC Calculation: 462 R Axis:   -53 Text Interpretation:  Sinus tachycardia Left anterior fascicular block Abnormal R-wave progression, late transition Confirmed by Julianne Rice [phone]) on 01/21/2019 6:03:03 PM   Radiology No results found.  Procedures Procedures (including critical care time)  Medications Ordered in ED Medications  sodium chloride 0.9 % bolus 500 mL (0 mLs Intravenous Stopped 01/21/19 1840)  sodium chloride 0.9 % bolus 500 mL (0 mLs Intravenous Stopped 01/21/19 2010)     Initial Impression / Assessment and Plan / ED Course  I have  reviewed the triage vital signs and the nursing notes.  Pertinent labs & imaging results that were available during my care of the patient were reviewed by me and considered in my medical decision making (see chart for details).       Patient states she is feeling better after IV fluids.  UA without evidence of infection.  Patient does have anemia and understands she will need to have this followed up.  Return precautions have been given.   Final Clinical Impressions(s) / ED Diagnoses   Final diagnoses:  Fatigue, unspecified type  Dehydration  Anemia, unspecified type    ED Discharge Orders    None       Julianne Rice, MD 01/21/19 2118

## 2019-01-21 NOTE — Progress Notes (Signed)
Pt presented prior to [facility] HDR for urine labs due to recent c/o urinary frequency.  Pt unable to void x 2 days. Minimally adequate fluid intake "couple bottles of water and a glass or 2 of sprite a day" per spouse. Severe fatigue. Poor appetite. After attempt to obtain urine via cath by M. Cross, NP, it was decided pt was volume-depleted. Dr. Sondra Come spoke with pt and encouraged pt to proceed to ED. Pt was escorted to ED per this RN and L. Quincy Simmonds, RN. Report given to [place], RN in [facility] ED. Loma Sousa, RN BSN

## 2019-01-22 ENCOUNTER — Telehealth: Payer: Self-pay | Admitting: Oncology

## 2019-01-22 NOTE — Telephone Encounter (Signed)
Left a message for Bonnita Nasuti and requested a return call.

## 2019-01-22 NOTE — Telephone Encounter (Signed)
Zoe Kim called back and said she is able to drink 80-100 ounces of fluid per day.  She usually drinks 4 16 oz bottles of water each day and will add on another one.  She said she is feeling better today and has been urinating frequently.  Advised her to call if she starts to feel dehydrated again.

## 2019-01-22 NOTE — Progress Notes (Unsigned)
Called to the [facility] clinic to assess patient due to no voiding for two days.  She states she has been drinking adequate amounts of fluid and denies feeling lower abdominal/pelvic pressure or distension.  Discussed with patient the recommendation to insert a foley catheter to see if she has been retaining urine.  Advised that if there is a minimal amount of urine in the bladder, we will remove the foley catheter.  If she has a significant amount of urine in her bladder when catheterized, we would plan to keep the foley in and move for further workup.  Discussed that sometimes patients will perform in and out cath for retention but she states she does not feel she would be able to do this.  Consent obtained to insert the foley. No allergies to latex or betadine.  25 F foley inserted without difficulty. No obstruction or resistance met.  Less than 5 cc of dark yellow urine out in the foley. Patient assisted to standing position and no more urine out from the bladder.  Patient became dizziness after standing for 10 seconds.  Assist back to the stretcher.  Urine to be sent for analysis and culture if possible given the low amount.  CBC and Cmet ordered per Dr. Sondra Come.  Spoke with Sandi Mealy PA with symptom management and he recommends patient going to the ED. Dr. Sondra Come to continue care for the patient.

## 2019-01-27 ENCOUNTER — Telehealth: Payer: Self-pay | Admitting: *Deleted

## 2019-01-27 NOTE — Telephone Encounter (Signed)
CALLED PATIENT TO REMIND OF HDR [place] 01-28-19 @ 10 AM, SPOKE WITH PATIENT AND SHE IS AWARE OF THIS [place].

## 2019-01-28 ENCOUNTER — Other Ambulatory Visit: Payer: Self-pay

## 2019-01-28 ENCOUNTER — Ambulatory Visit
Admission: RE | Admit: 2019-01-28 | Discharge: 2019-01-28 | Disposition: A | Discharge status: Home / Self Care | Payer: PPO | Source: Ambulatory Visit | Attending: Radiation Oncology | Admitting: Radiation Oncology

## 2019-01-28 DIAGNOSIS — Z510 Encounter for antineoplastic radiation therapy: Secondary | ICD-10-CM | POA: Diagnosis not present

## 2019-01-28 DIAGNOSIS — C541 Malignant neoplasm of endometrium: Secondary | ICD-10-CM

## 2019-01-28 NOTE — Progress Notes (Signed)
  Radiation Oncology         [phone] ________________________________  Name: Zoe Kim MRN: [id number]  Date: 01/28/2019  DOB: [birth year]  CC: [name], MD  [name], MD  HDR BRACHYTHERAPY NOTE  DIAGNOSIS: 73 y.o. 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 She will be treated with a 2.5 cm diameter segmented cylinder. This conforms to her anatomy without undue discomfort.  Vaginal brachytherapy procedure node: The patient was brought to the [facility]. Identity was confirmed. All relevant records and images related to the planned course of therapy were reviewed. The patient freely provided informed written consent to proceed with treatment after reviewing the details related to the planned course of therapy. The consent form was witnessed and verified by the simulation staff. Then, the patient was set-up in a stable reproducible supine position for radiation therapy. Pelvic exam revealed the vaginal cuff to be intact. The patient's custom vaginal cylinder was placed in the proximal vagina. This was affixed to the CT/MR stabilization plate to prevent slippage. Patient tolerated the placement well.  Verification simulation note:  A fiducial marker was placed within the vaginal cylinder. An AP and lateral film was then obtained through the pelvis area. This documented accurate position of the vaginal cylinder for treatment.  HDR BRACHYTHERAPY TREATMENT  The remote afterloading device was affixed to the vaginal cylinder by catheter. Patient then proceeded to undergo her third high-dose-rate treatment directed at the proximal vagina. The patient was prescribed a dose of 6.0 gray to be delivered to the mucosal surface. Treatment length was 3.0  cm. Patient was treated with 1 channel using 7 dwell positions. Treatment time was 216.2 seconds. Iridium 192 was the high-dose-rate source for treatment. The patient tolerated the treatment well. After completion of her therapy, a radiation survey was performed documenting return of the iridium source into the [employer] safe.   PLAN: The patient will return on 02/04/2019 for her fourth HDR treatment.   ________________________________  Blair Promise, PhD, MD   This document serves as a record of services personally performed by Gery Pray, MD. It was created on his behalf by Wilburn Mylar, a trained medical scribe. The creation of this record is based on the scribe's personal observations and the provider's statements to them. This document has been checked and approved by the attending provider.

## 2019-02-03 ENCOUNTER — Inpatient Hospital Stay: Payer: PPO

## 2019-02-03 ENCOUNTER — Telehealth: Payer: Self-pay | Admitting: Hematology and Oncology

## 2019-02-03 ENCOUNTER — Inpatient Hospital Stay: Payer: PPO | Attending: Hematology and Oncology

## 2019-02-03 ENCOUNTER — Other Ambulatory Visit: Payer: Self-pay

## 2019-02-03 ENCOUNTER — Telehealth: Payer: Self-pay | Admitting: *Deleted

## 2019-02-03 ENCOUNTER — Inpatient Hospital Stay (HOSPITAL_BASED_OUTPATIENT_CLINIC_OR_DEPARTMENT_OTHER): Payer: PPO | Admitting: Hematology and Oncology

## 2019-02-03 VITALS — BP 160/76 | HR 122 | Temp 98.9°F | Resp 19 | Ht 64.0 in | Wt 175.6 lb

## 2019-02-03 DIAGNOSIS — G620 Drug-induced polyneuropathy: Secondary | ICD-10-CM | POA: Insufficient documentation

## 2019-02-03 DIAGNOSIS — C541 Malignant neoplasm of endometrium: Secondary | ICD-10-CM

## 2019-02-03 DIAGNOSIS — R0602 Shortness of breath: Secondary | ICD-10-CM | POA: Diagnosis not present

## 2019-02-03 DIAGNOSIS — R000 Tachycardia, unspecified: Secondary | ICD-10-CM

## 2019-02-03 DIAGNOSIS — D61818 Other pancytopenia: Secondary | ICD-10-CM

## 2019-02-03 DIAGNOSIS — Z791 Long term (current) use of non-steroidal anti-inflammatories (NSAID): Secondary | ICD-10-CM | POA: Diagnosis not present

## 2019-02-03 DIAGNOSIS — Z79899 Other long term (current) drug therapy: Secondary | ICD-10-CM

## 2019-02-03 DIAGNOSIS — K5909 Other constipation: Secondary | ICD-10-CM

## 2019-02-03 DIAGNOSIS — D6481 Anemia due to antineoplastic chemotherapy: Secondary | ICD-10-CM

## 2019-02-03 DIAGNOSIS — Z9221 Personal history of antineoplastic chemotherapy: Secondary | ICD-10-CM | POA: Diagnosis not present

## 2019-02-03 DIAGNOSIS — T451X5A Adverse effect of antineoplastic and immunosuppressive drugs, initial encounter: Secondary | ICD-10-CM | POA: Diagnosis not present

## 2019-02-03 DIAGNOSIS — Z452 Encounter for adjustment and management of vascular access device: Secondary | ICD-10-CM | POA: Diagnosis not present

## 2019-02-03 DIAGNOSIS — R2681 Unsteadiness on feet: Secondary | ICD-10-CM | POA: Diagnosis not present

## 2019-02-03 DIAGNOSIS — R5381 Other malaise: Secondary | ICD-10-CM | POA: Insufficient documentation

## 2019-02-03 LAB — CMP (CANCER CENTER ONLY)
ALT: 15 U/L (ref 0–44)
AST: 20 U/L (ref 15–41)
Albumin: 3.9 g/dL (ref 3.5–5.0)
Alkaline Phosphatase: 130 U/L — ABNORMAL HIGH (ref 38–126)
Anion gap: 11 (ref 5–15)
BUN: 12 mg/dL (ref 8–23)
CO2: 23 mmol/L (ref 22–32)
Calcium: 8.9 mg/dL (ref 8.9–10.3)
Chloride: 101 mmol/L (ref 98–111)
Creatinine: 0.79 mg/dL (ref 0.44–1.00)
GFR, Est AFR Am: >60 mL/min (ref >60)
GFR, Estimated: >60 mL/min (ref >60)
Glucose, Bld: 114 mg/dL — ABNORMAL HIGH (ref 70–99)
Potassium: 4.1 mmol/L (ref 3.5–5.1)
Sodium: 135 mmol/L (ref 135–145)
Total Bilirubin: 0.5 mg/dL (ref 0.3–1.2)
Total Protein: 6.8 g/dL (ref 6.5–8.1)

## 2019-02-03 LAB — CBC WITH DIFFERENTIAL (CANCER CENTER ONLY)
Abs Immature Granulocytes: 0.08 10*3/uL — ABNORMAL HIGH (ref 0.00–0.07)
Basophils Absolute: 0.1 10*3/uL (ref 0.0–0.1)
Basophils Relative: 1 %
Eosinophils Absolute: 0 10*3/uL (ref 0.0–0.5)
Eosinophils Relative: 1 %
HCT: 24.4 % — ABNORMAL LOW (ref 36.0–46.0)
Hemoglobin: 8.2 g/dL — ABNORMAL LOW (ref 12.0–15.0)
Immature Granulocytes: 1 %
Lymphocytes Relative: 18 %
Lymphs Abs: 1.3 10*3/uL (ref 0.7–4.0)
MCH: 32.8 pg (ref 26.0–34.0)
MCHC: 33.6 g/dL (ref 30.0–36.0)
MCV: 97.6 fL (ref 80.0–100.0)
Monocytes Absolute: 1.2 10*3/uL — ABNORMAL HIGH (ref 0.1–1.0)
Monocytes Relative: 17 %
Neutro Abs: 4.4 10*3/uL (ref 1.7–7.7)
Neutrophils Relative %: 62 %
Platelet Count: 203 10*3/uL (ref 150–400)
RBC: 2.5 MIL/uL — ABNORMAL LOW (ref 3.87–5.11)
RDW: 18 % — ABNORMAL HIGH (ref 11.5–15.5)
WBC Count: 7 10*3/uL (ref 4.0–10.5)
nRBC: 0 % (ref 0.0–0.2)

## 2019-02-03 MED ORDER — SODIUM CHLORIDE 0.9% FLUSH
10.0000 mL | Freq: Once | INTRAVENOUS | Status: AC
  Administered 2019-02-03: 10:00:00 10 mL
  Filled 2019-02-03: qty 10

## 2019-02-03 MED ORDER — GABAPENTIN 300 MG PO CAPS: 300.0000 mg | ORAL_CAPSULE | Freq: Two times a day (BID) | ORAL | 11 refills | Status: DC

## 2019-02-03 NOTE — Telephone Encounter (Signed)
Called patient to remind of HDR Tx. for 02-04-19 @ 10 am, spoke with patient and she is aware of this tx.

## 2019-02-03 NOTE — Telephone Encounter (Signed)
I talk with patient regarding schedule  

## 2019-02-04 ENCOUNTER — Ambulatory Visit
Admission: RE | Admit: 2019-02-04 | Discharge: 2019-02-04 | Disposition: A | Discharge status: Home / Self Care | Payer: PPO | Source: Ambulatory Visit | Attending: Radiation Oncology | Admitting: Radiation Oncology

## 2019-02-04 ENCOUNTER — Encounter: Payer: Self-pay | Admitting: Hematology and Oncology

## 2019-02-04 ENCOUNTER — Telehealth: Payer: Self-pay | Admitting: *Deleted

## 2019-02-04 DIAGNOSIS — Z510 Encounter for antineoplastic radiation therapy: Secondary | ICD-10-CM | POA: Insufficient documentation

## 2019-02-04 DIAGNOSIS — C541 Malignant neoplasm of endometrium: Secondary | ICD-10-CM | POA: Insufficient documentation

## 2019-02-04 NOTE — Assessment & Plan Note (Signed)
She has mild progressive neuropathy I plan further dose reduction in the future.

## 2019-02-04 NOTE — Assessment & Plan Note (Signed)
She has significant progressive anemia due to side effects of treatment She is mildly symptomatic with shortness of breath and tachycardia We discussed the risk and benefits of transfusion support versus delaying treatment and she elected to delay her treatment for another 2 weeks I plan dose adjustment as above She denies recent bleeding

## 2019-02-04 NOTE — Telephone Encounter (Signed)
CALLED PATIENT TO INFORM OF LAST HDR TX. FOR 02-18-19 @ 3 PM, SPOKE WITH PATIENT AND SHE IS AWARE OF THIS [place].

## 2019-02-04 NOTE — Assessment & Plan Note (Signed)
She has recent constipation We discussed laxative therapy

## 2019-02-04 NOTE — Assessment & Plan Note (Signed)
She has significant progressive pancytopenia likely due to bone marrow suppression from chemotherapy and additional radiation therapy I recommend taking a treatment break and resume in 2 weeks We also discussed the risk and benefits of transfusion support versus taking a treatment break and ultimately she agreed to defer until after radiation is finished I plan to reduce the dose of future treatment I recommend supportive care

## 2019-02-04 NOTE — Progress Notes (Signed)
[facility] OFFICE PROGRESS NOTE  Patient Care Team: [name], MD as PCP - General (Family Medicine) Calvert Cantor, MD as Consulting Physician (Ophthalmology)  ASSESSMENT & PLAN:  Endometrial cancer [facility]) She has significant progressive pancytopenia likely due to bone marrow suppression from chemotherapy and additional radiation therapy I recommend taking a treatment break and resume in 2 weeks We also discussed the risk and benefits of transfusion support versus taking a treatment break and ultimately she agreed to defer until after radiation is finished I plan to reduce the dose of future treatment I recommend supportive care  Anemia due to antineoplastic chemotherapy She has significant progressive anemia due to side effects of treatment She is mildly symptomatic with shortness of breath and tachycardia We discussed the risk and benefits of transfusion support versus delaying treatment and she elected to delay her treatment for another 2 weeks I plan dose adjustment as above She denies recent bleeding  Other constipation She has recent constipation We discussed laxative therapy  Peripheral neuropathy due to chemotherapy [facility]) She has mild progressive neuropathy I plan further dose reduction in the future.   No orders of the defined types were placed in this encounter.   INTERVAL HISTORY: Please see below for problem oriented charting. She returns for further follow-up, to be seen prior to cycle 5 of treatment She is more symptomatic today with shortness of breath and tachycardia She denies recent bleeding She also noticed neuropathy slightly worse She had mild constipation recently alleviated by laxatives Denies recent cough, fever or chills  SUMMARY OF ONCOLOGIC HISTORY: Oncology History Overview Note  Mixed serous and endometrioid Her2 Negative MMR normal MSI Stable    Endometrial cancer ([place])  08/31/2011 Pathology Results   EPITHELIAL  CELL ABNORMALITY: SQUAMOUS CELLS LOW-GRADE SQUAMOUS INTRAEPITHELIAL LESION (LSIL) ENCOMPASSING: HPV/MILD DYSPLASIA/CIN1   09/19/2018 Pathology Results   Endometrial biopsy Endometrial Serous Carcinoma and Endometrioid Adenocarcinoma, FIGO Grade 2   09/19/2018 Initial Diagnosis   Zoe Kim is a 73 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  The patient reports 6 months or maybe longer of intermittent vaginal spotting.  She was seen by Dr. [name] on September 19, 2018 who performed a Pap smear which revealed atypical glandular cells and an endocervical biopsy which revealed a benign endocervical polyp, and endometrial biopsy which revealed high-grade serous endometrial adenocarcinoma and adenocarcinoma FIGO grade 2.    10/03/2018 Imaging   CT abdomen and pelvis 1. Enhancing soft tissue density in endometrial cavity, consistent with history of endometrial carcinoma. No evidence of extra uterine tumor extension or metastatic disease. 2. Tiny less than 1 cm anterior uterine fibroid. 3. Small hiatal hernia.  Aortic Atherosclerosis (ICD10-I70.0).   10/14/2018 Pathology Results   1. Lymph node, sentinel, biopsy, right external iliac - METASTATIC CARCINOMA PRESENT IN (1) OF (1) LYMPH NODE. 2. Lymph node, sentinel, biopsy, right deep obturator - ONE LYMPH NODE, NEGATIVE FOR CARCINOMA (0/1). 3. Lymph node, sentinel, biopsy, left external iliac - ONE LYMPH NODE, NEGATIVE FOR CARCINOMA (0/1). 4. Uterus +/- tubes/ovaries, neoplastic, cervix, bilateral fallopian tubes and ovaries - MIXED CELL CARCINOMA OF ENDOMETRIUM (MIXED ENDOMETRIOID CARCINOMA AND SEROUS CARCINOMA), WITH INVASION LESS THAN HALF OF THE MYOMETRIUM. - NO INVOLVEMENT OF UTERINE SEROSA, CERVICAL STROMA OR ADNEXA. - SEE ONCOLOGY TABLE. Microscopic Comment 4. UTERUS, CARCINOMA OR CARCINOSARCOMA Procedure: Total hysterectomy and bilateral  salpingo-oophorectomy Histologic type: Mixed cell carcinoma (Endometrioid carcinoma 70% and Serous  carcinoma 30%) Histologic Grade: Not applicable Myometrial Invasion: Present Depth of invasion: 6 mm Myometrial thickness: 17 mm Uterine Serosa Involvement: Not identified Cervical Stromal Involvement: Not identified Extent of Involvement of Other Organs: Not involved Lymphovascular Invasion: Present Regional Lymph Nodes: Examined: 3 Sentinel 0 Non-sentinel 3 Total Lymph nodes with metastases: 1 Isolated tumor cells (< 0.2 mm): 0 Micrometastasis (> 0.2 mm and < 2.0 mm): 0 Macrometastasis (> 2.0 mm): 1 Extracapsular extension: No definite ECE MMR/MSI Testing: Will be performed and the results reported separately Pathologic Stage Classification (pTNM, AJCC 8th edition): pT1a, pN1a FIGO Stage: III-C1 Representative Tumor Block: 1-A Comment: The size of the metastatic deposit in the right external iliac sentinel lymph node is 32 mm. The metastatic deposit is virtually all serous carcinoma. Intradepartmental consultation was obtained   10/14/2018 Surgery   Pre-operative Diagnosis: endometrial cancer grade 3 (serous), obesity  Operation: Robotic-assisted laparoscopic total hysterectomy with bilateral salpingoophorectomy, SLN,  biopsy (22 modifier for obesity, BMI 32).  Surgeon: Donaciano Eva  Operative Findings:  : 8cm arcuate uterus, normal appearing tubes and ovaries. Obesity with BMI 32. Narrow vaginal introitus.     10/17/2018 Cancer Staging   Staging form: Corpus Uteri - Carcinoma and Carcinosarcoma, AJCC 8th Edition - Pathologic: FIGO Stage IIIC1 (pT1, pN1a, cM0) - Signed by Heath Lark, MD on 10/17/2018    Genetic Testing   Patient has genetic testing done for MMR. Results revealed patient has the following mutation(s): MMR: Normal    Genetic Testing   Patient has genetic testing done for MSI on pathology from 10/14/18. Results revealed patient has the  following: MSI Stable   11/06/2018 Procedure   Placement of single lumen port a cath via right internal jugular vein. The catheter tip lies at the cavo-atrial junction. A power injectable port a cath was placed and is ready for immediate use.   11/10/2018 -  Chemotherapy   The patient had carboplatin and taxol for chemotherapy treatment.     11/10/2018 -  Chemotherapy   The patient had carboplatin and taxol for chemotherapy treatment.       REVIEW OF SYSTEMS:   Constitutional: Denies fevers, chills or abnormal weight loss Eyes: Denies blurriness of vision Ears, nose, mouth, throat, and face: Denies mucositis or sore throat Respiratory: Denies cough, dyspnea or wheezes Gastrointestinal:  Denies nausea, heartburn or change in bowel habits Skin: Denies abnormal skin rashes Lymphatics: Denies new lymphadenopathy or easy bruising Neurological:Denies numbness, tingling or new weaknesses Behavioral/Psych: Mood is stable, no new changes  All other systems were reviewed with the patient and are negative.  I have reviewed the past medical history, past surgical history, social history and family history with the patient and they are unchanged from previous note.  ALLERGIES:  is allergic to sulfa antibiotics.  MEDICATIONS:  Current Outpatient Medications  Medication Sig Dispense Refill  . acetaminophen (TYLENOL) 500 MG tablet Take 1,000 mg by mouth every 6 (six) hours as needed for moderate pain.    Marland Kitchen gabapentin (NEURONTIN) 300 MG capsule Take 1 capsule (300 mg total) by mouth 2 (two) times daily. 60 capsule 11  . ibuprofen (ADVIL) 600 MG tablet Take 1 tablet (600 mg total) by mouth every 6 (six) hours as needed for moderate pain. For AFTER surgery 30 tablet 1  . MELATONIN PO Take 1 tablet by mouth at bedtime as needed (sleep).    . traMADol (ULTRAM) 50 MG tablet Take 1 tablet (50 mg total) by mouth every 6 (six) hours as needed.  30 tablet 0   No current facility-administered medications for  this visit.     PHYSICAL EXAMINATION: ECOG PERFORMANCE STATUS: 2 - Symptomatic, <50% confined to bed  Vitals:   02/03/19 1023  BP: (!) 160/76  Pulse: (!) 122  Resp: 19  Temp: 98.9 F (37.2 C)  SpO2: 100%   Filed Weights   02/03/19 1023  Weight: 175 lb 9.6 oz (79.7 kg)    GENERAL:alert, no distress and comfortable.  She looks pale SKIN: skin color, texture, turgor are normal, no rashes or significant lesions EYES: normal, Conjunctiva are pink and non-injected, sclera clear OROPHARYNX:no exudate, no erythema and lips, buccal mucosa, and tongue normal  NECK: supple, thyroid normal size, non-tender, without nodularity LYMPH:  no palpable lymphadenopathy in the cervical, axillary or inguinal LUNGS: clear to auscultation and percussion with normal breathing effort HEART: Tachycardia, no murmurs and no lower extremity edema ABDOMEN:abdomen soft, non-tender and normal bowel sounds Musculoskeletal:no cyanosis of digits and no clubbing  NEURO: alert & oriented x 3 with fluent speech, no focal motor/sensory deficits  LABORATORY DATA:  I have reviewed the data as listed    Component Value Date/Time   NA 135 02/03/2019 0957   NA 141 07/05/2016 1642   K 4.1 02/03/2019 0957   CL 101 02/03/2019 0957   CO2 23 02/03/2019 0957   GLUCOSE 114 (H) 02/03/2019 0957   BUN 12 02/03/2019 0957   BUN 10 07/05/2016 1642   CREATININE 0.79 02/03/2019 0957   CREATININE 0.86 05/22/2012 1415   CALCIUM 8.9 02/03/2019 0957   PROT 6.8 02/03/2019 0957   ALBUMIN 3.9 02/03/2019 0957   AST 20 02/03/2019 0957   ALT 15 02/03/2019 0957   ALKPHOS 130 (H) 02/03/2019 0957   BILITOT 0.5 02/03/2019 0957   GFRNONAA >60 02/03/2019 0957   GFRAA >60 02/03/2019 0957    No results found for: SPEP, UPEP  Lab Results  Component Value Date   WBC 7.0 02/03/2019   NEUTROABS 4.4 02/03/2019   HGB 8.2 (L) 02/03/2019   HCT 24.4 (L) 02/03/2019   MCV 97.6 02/03/2019   PLT 203 02/03/2019      Chemistry       Component Value Date/Time   NA 135 02/03/2019 0957   NA 141 07/05/2016 1642   K 4.1 02/03/2019 0957   CL 101 02/03/2019 0957   CO2 23 02/03/2019 0957   BUN 12 02/03/2019 0957   BUN 10 07/05/2016 1642   CREATININE 0.79 02/03/2019 0957   CREATININE 0.86 05/22/2012 1415      Component Value Date/Time   CALCIUM 8.9 02/03/2019 0957   ALKPHOS 130 (H) 02/03/2019 0957   AST 20 02/03/2019 0957   ALT 15 02/03/2019 0957   BILITOT 0.5 02/03/2019 0957      All questions were answered. The patient knows to call the clinic with any problems, questions or concerns. No barriers to learning was detected.  I spent 25 minutes counseling the patient face to face. The total time spent in the appointment was 30 minutes and more than 50% was on counseling and review of test results  Heath Lark, MD 02/04/2019 8:08 AM

## 2019-02-10 ENCOUNTER — Inpatient Hospital Stay (HOSPITAL_COMMUNITY)
Admission: EM | Admit: 2019-02-10 | Discharge: 2019-02-17 | DRG: 252 | Disposition: A | Discharge status: Home Health | Payer: PPO | Attending: Internal Medicine | Admitting: Internal Medicine

## 2019-02-10 ENCOUNTER — Emergency Department (HOSPITAL_COMMUNITY): Payer: PPO

## 2019-02-10 ENCOUNTER — Encounter (HOSPITAL_COMMUNITY): Payer: Self-pay | Admitting: Family Medicine

## 2019-02-10 DIAGNOSIS — T80219A Unspecified infection due to central venous catheter, initial encounter: Secondary | ICD-10-CM | POA: Diagnosis not present

## 2019-02-10 DIAGNOSIS — R509 Fever, unspecified: Secondary | ICD-10-CM | POA: Diagnosis present

## 2019-02-10 DIAGNOSIS — Z20828 Contact with and (suspected) exposure to other viral communicable diseases: Secondary | ICD-10-CM | POA: Diagnosis present

## 2019-02-10 DIAGNOSIS — R32 Unspecified urinary incontinence: Secondary | ICD-10-CM | POA: Diagnosis present

## 2019-02-10 DIAGNOSIS — Z825 Family history of asthma and other chronic lower respiratory diseases: Secondary | ICD-10-CM | POA: Diagnosis not present

## 2019-02-10 DIAGNOSIS — R739 Hyperglycemia, unspecified: Secondary | ICD-10-CM | POA: Diagnosis present

## 2019-02-10 DIAGNOSIS — E876 Hypokalemia: Secondary | ICD-10-CM | POA: Diagnosis present

## 2019-02-10 DIAGNOSIS — Z5111 Encounter for antineoplastic chemotherapy: Secondary | ICD-10-CM | POA: Diagnosis not present

## 2019-02-10 DIAGNOSIS — Z8249 Family history of ischemic heart disease and other diseases of the circulatory system: Secondary | ICD-10-CM

## 2019-02-10 DIAGNOSIS — D539 Nutritional anemia, unspecified: Secondary | ICD-10-CM | POA: Diagnosis present

## 2019-02-10 DIAGNOSIS — G620 Drug-induced polyneuropathy: Secondary | ICD-10-CM | POA: Diagnosis present

## 2019-02-10 DIAGNOSIS — B957 Other staphylococcus as the cause of diseases classified elsewhere: Secondary | ICD-10-CM | POA: Diagnosis present

## 2019-02-10 DIAGNOSIS — Z452 Encounter for adjustment and management of vascular access device: Secondary | ICD-10-CM | POA: Diagnosis not present

## 2019-02-10 DIAGNOSIS — Z823 Family history of stroke: Secondary | ICD-10-CM | POA: Diagnosis not present

## 2019-02-10 DIAGNOSIS — C541 Malignant neoplasm of endometrium: Secondary | ICD-10-CM | POA: Diagnosis present

## 2019-02-10 DIAGNOSIS — L659 Nonscarring hair loss, unspecified: Secondary | ICD-10-CM | POA: Diagnosis not present

## 2019-02-10 DIAGNOSIS — Z923 Personal history of irradiation: Secondary | ICD-10-CM | POA: Diagnosis not present

## 2019-02-10 DIAGNOSIS — T80211A Bloodstream infection due to central venous catheter, initial encounter: Principal | ICD-10-CM | POA: Diagnosis present

## 2019-02-10 DIAGNOSIS — D849 Immunodeficiency, unspecified: Secondary | ICD-10-CM | POA: Diagnosis not present

## 2019-02-10 DIAGNOSIS — R7881 Bacteremia: Secondary | ICD-10-CM | POA: Diagnosis not present

## 2019-02-10 DIAGNOSIS — Z79899 Other long term (current) drug therapy: Secondary | ICD-10-CM

## 2019-02-10 DIAGNOSIS — Z791 Long term (current) use of non-steroidal anti-inflammatories (NSAID): Secondary | ICD-10-CM

## 2019-02-10 DIAGNOSIS — A4181 Sepsis due to Enterococcus: Secondary | ICD-10-CM | POA: Diagnosis present

## 2019-02-10 DIAGNOSIS — D638 Anemia in other chronic diseases classified elsewhere: Secondary | ICD-10-CM | POA: Diagnosis present

## 2019-02-10 DIAGNOSIS — B9561 Methicillin susceptible Staphylococcus aureus infection as the cause of diseases classified elsewhere: Secondary | ICD-10-CM | POA: Diagnosis not present

## 2019-02-10 DIAGNOSIS — E871 Hypo-osmolality and hyponatremia: Secondary | ICD-10-CM | POA: Diagnosis present

## 2019-02-10 DIAGNOSIS — Z0389 Encounter for observation for other suspected diseases and conditions ruled out: Secondary | ICD-10-CM | POA: Diagnosis not present

## 2019-02-10 DIAGNOSIS — B952 Enterococcus as the cause of diseases classified elsewhere: Secondary | ICD-10-CM | POA: Diagnosis not present

## 2019-02-10 DIAGNOSIS — Z881 Allergy status to other antibiotic agents status: Secondary | ICD-10-CM | POA: Diagnosis not present

## 2019-02-10 DIAGNOSIS — A411 Sepsis due to other specified staphylococcus: Secondary | ICD-10-CM | POA: Diagnosis present

## 2019-02-10 DIAGNOSIS — Z803 Family history of malignant neoplasm of breast: Secondary | ICD-10-CM

## 2019-02-10 DIAGNOSIS — D6481 Anemia due to antineoplastic chemotherapy: Secondary | ICD-10-CM | POA: Diagnosis present

## 2019-02-10 DIAGNOSIS — N390 Urinary tract infection, site not specified: Secondary | ICD-10-CM | POA: Diagnosis present

## 2019-02-10 DIAGNOSIS — E872 Acidosis: Secondary | ICD-10-CM | POA: Diagnosis present

## 2019-02-10 DIAGNOSIS — Z79891 Long term (current) use of opiate analgesic: Secondary | ICD-10-CM | POA: Diagnosis not present

## 2019-02-10 DIAGNOSIS — A419 Sepsis, unspecified organism: Secondary | ICD-10-CM | POA: Diagnosis not present

## 2019-02-10 DIAGNOSIS — Z95828 Presence of other vascular implants and grafts: Secondary | ICD-10-CM | POA: Diagnosis not present

## 2019-02-10 DIAGNOSIS — K5900 Constipation, unspecified: Secondary | ICD-10-CM | POA: Diagnosis not present

## 2019-02-10 DIAGNOSIS — Z9221 Personal history of antineoplastic chemotherapy: Secondary | ICD-10-CM | POA: Diagnosis not present

## 2019-02-10 DIAGNOSIS — T451X5A Adverse effect of antineoplastic and immunosuppressive drugs, initial encounter: Secondary | ICD-10-CM | POA: Diagnosis present

## 2019-02-10 DIAGNOSIS — R6510 Systemic inflammatory response syndrome (SIRS) of non-infectious origin without acute organ dysfunction: Secondary | ICD-10-CM | POA: Diagnosis not present

## 2019-02-10 DIAGNOSIS — R350 Frequency of micturition: Secondary | ICD-10-CM | POA: Diagnosis not present

## 2019-02-10 LAB — COMPREHENSIVE METABOLIC PANEL
ALT: 27 U/L (ref 0–44)
AST: 33 U/L (ref 15–41)
Albumin: 4.3 g/dL (ref 3.5–5.0)
Alkaline Phosphatase: 121 U/L (ref 38–126)
Anion gap: 12 (ref 5–15)
BUN: 13 mg/dL (ref 8–23)
CO2: 23 mmol/L (ref 22–32)
Calcium: 9.4 mg/dL (ref 8.9–10.3)
Chloride: 98 mmol/L (ref 98–111)
Creatinine, Ser: 0.91 mg/dL (ref 0.44–1.00)
GFR calc Af Amer: >60 mL/min (ref >60)
GFR calc non Af Amer: >60 mL/min (ref >60)
Glucose, Bld: 116 mg/dL — ABNORMAL HIGH (ref 70–99)
Potassium: 4 mmol/L (ref 3.5–5.1)
Sodium: 133 mmol/L — ABNORMAL LOW (ref 135–145)
Total Bilirubin: 0.8 mg/dL (ref 0.3–1.2)
Total Protein: 7.3 g/dL (ref 6.5–8.1)

## 2019-02-10 LAB — CBC WITH DIFFERENTIAL/PLATELET
Abs Immature Granulocytes: 0.08 10*3/uL — ABNORMAL HIGH (ref 0.00–0.07)
Basophils Absolute: 0 10*3/uL (ref 0.0–0.1)
Basophils Relative: 0 %
Eosinophils Absolute: 0 10*3/uL (ref 0.0–0.5)
Eosinophils Relative: 0 %
HCT: 26.7 % — ABNORMAL LOW (ref 36.0–46.0)
Hemoglobin: 8.9 g/dL — ABNORMAL LOW (ref 12.0–15.0)
Immature Granulocytes: 1 %
Lymphocytes Relative: 14 %
Lymphs Abs: 1 10*3/uL (ref 0.7–4.0)
MCH: 33.2 pg (ref 26.0–34.0)
MCHC: 33.3 g/dL (ref 30.0–36.0)
MCV: 99.6 fL (ref 80.0–100.0)
Monocytes Absolute: 0.7 10*3/uL (ref 0.1–1.0)
Monocytes Relative: 10 %
Neutro Abs: 5.4 10*3/uL (ref 1.7–7.7)
Neutrophils Relative %: 75 %
Platelets: 295 10*3/uL (ref 150–400)
RBC: 2.68 MIL/uL — ABNORMAL LOW (ref 3.87–5.11)
RDW: 17.1 % — ABNORMAL HIGH (ref 11.5–15.5)
WBC: 7.3 10*3/uL (ref 4.0–10.5)
nRBC: 0 % (ref 0.0–0.2)

## 2019-02-10 LAB — PROTIME-INR
INR: 1.1 (ref 0.8–1.2)
Prothrombin Time: 14 seconds (ref 11.4–15.2)

## 2019-02-10 LAB — LACTIC ACID, PLASMA: Lactic Acid, Venous: 1.2 mmol/L (ref 0.5–1.9)

## 2019-02-10 MED ORDER — SODIUM CHLORIDE 0.9 % IV BOLUS
1000.0000 mL | Freq: Once | INTRAVENOUS | Status: AC
  Administered 2019-02-10: 1000 mL via INTRAVENOUS

## 2019-02-10 NOTE — ED Triage Notes (Addendum)
Patient states she endometrial cancer, receives chemotherapy and radiation, but complaining of fever and fatigue. Symptoms started today. Patient states she took Tylenol 2 tablets around 19:00.

## 2019-02-10 NOTE — ED Provider Notes (Signed)
[facility] DEPT Provider Note   CSN: [id number] Arrival date & time: 02/10/19  2047     History   Chief Complaint Chief Complaint  Patient presents with  . Fever  . Fatigue    HPI Zoe Kim is a 73 y.o. female.     Patient with history of endometrial CA, currently undergoing q3w chemotherapy and radiation, presents with one day history of fever with Tmax 102 at home and generalized fatigue and generalized weakness. No pain, vomiting, or diarrhea. She experienced DOE today which is not normal for her. At rest she does not have SOB. She reports a minimal dry cough today, otherwise, no respiratory symptoms. No urinary complaints other than decreased amounts felt to be secondary to less PO intake.   The history is provided by the patient. No language interpreter was used.  Fever Associated symptoms: cough and nausea   Associated symptoms: no chest pain, no chills, no congestion, no dysuria, no myalgias and no sore throat     Past Medical History:  Diagnosis Date  . Anxiety   . Arthritis   . Depression   . Endometrial ca ([place])   . Family history of breast cancer   . Family history of lung cancer   . Heart murmur    "years ago"  . Insomnia   . Pneumonia    "long time ago"  . Primary localized osteoarthrosis of the knee, right   . Urinary urgency     Patient Active Problem List   Diagnosis Date Noted  . Tachycardia 01/13/2019  . Neutropenic fever ([place]) 12/30/2018  . Anemia due to antineoplastic chemotherapy 12/24/2018  . Mild depression ([place]) 12/03/2018  . Other constipation 11/20/2018  . Peripheral neuropathy due to chemotherapy ([place]) 11/20/2018  . Family history of breast cancer   . Family history of lung cancer   . Bone pain 11/17/2018  . Dysuria 10/22/2018  . Obesity 10/14/2018  . Endometrial cancer ([place]) 10/01/2018  . Primary localized osteoarthritis of right knee 07/30/2016  . Anxiety   . Primary localized  osteoarthrosis of the knee, right   . Urinary urgency     Past Surgical History:  Procedure Laterality Date  . COLONOSCOPY    . EYE SURGERY Bilateral    cataract with lens  . IR IMAGING GUIDED PORT INSERTION  11/06/2018  . ROBOTIC ASSISTED TOTAL HYSTERECTOMY WITH BILATERAL SALPINGO OOPHERECTOMY N/A 10/14/2018   Procedure: ROBOTIC ASSISTED TOTAL HYSTERECTOMY WITH BILATERAL SALPINGO OOPHORECTOMY;  Surgeon: Everitt Amber, MD;  Location: [facility];  Service: Gynecology;  Laterality: N/A;  . SENTINEL NODE BIOPSY N/A 10/14/2018   Procedure: SENTINEL LYMPH NODE BIOPSY;  Surgeon: Everitt Amber, MD;  Location: [facility];  Service: Gynecology;  Laterality: N/A;  . TONSILLECTOMY    . TOOTH EXTRACTION Right 08/27/2017  . TOTAL KNEE ARTHROPLASTY Right 07/30/2016   Procedure: TOTAL KNEE ARTHROPLASTY;  Surgeon: Elsie Saas, MD;  Location: [facility];  Service: Orthopedics;  Laterality: Right;  . VEIN LIGATION AND STRIPPING       OB History    Gravida  1   Para  0   Term      Preterm      AB  1   Living  0     SAB      TAB      Ectopic      Multiple      Live Births  Home Medications    Prior to Admission medications   Medication Sig Start Date End Date Taking? Authorizing Provider  acetaminophen (TYLENOL) 500 MG tablet Take 1,000 mg by mouth every 6 (six) hours as needed for moderate pain.   Yes [provider]  gabapentin (NEURONTIN) 300 MG capsule Take 1 capsule (300 mg total) by mouth 2 (two) times daily. 02/03/19  Yes Gorsuch, Ni, MD  ibuprofen (ADVIL) 600 MG tablet Take 1 tablet (600 mg total) by mouth every 6 (six) hours as needed for moderate pain. For AFTER surgery 10/01/18  Yes Cross, Melissa D, NP  MELATONIN PO Take 1 tablet by mouth at bedtime as needed (sleep).   Yes [provider]  traMADol (ULTRAM) 50 MG tablet Take 1 tablet (50 mg total) by mouth every 6 (six) hours as needed. Patient taking differently: Take 50 mg by mouth every 6 (six) hours as  needed for moderate pain.  11/17/18  Yes Heath Lark, MD    Family History Family History  Problem Relation Age of Onset  . Heart disease Mother   . COPD Mother   . Cancer Mother        uterine (possibly, pt unsure)  . Breast cancer Mother        dx in 73s  . Heart disease Father   . COPD Brother   . Heart disease Brother   . Cancer Brother 23       lung ca, smoker  . Cancer Maternal Grandmother        breast dx early 80s  . Stroke Maternal Grandfather     Social History Social History   Tobacco Use  . Smoking status: Never Smoker  . Smokeless tobacco: Never Used  . Tobacco comment: smoked 2 weeks in college  Substance Use Topics  . Alcohol use: No  . Drug use: No     Allergies   Sulfa antibiotics   Review of Systems Review of Systems  Constitutional: Positive for fatigue and fever. Negative for chills.  HENT: Negative.  Negative for congestion and sore throat.   Respiratory: Positive for cough. Negative for shortness of breath.   Cardiovascular: Negative.  Negative for chest pain.  Gastrointestinal: Positive for nausea. Negative for abdominal pain.  Genitourinary: Positive for decreased urine volume. Negative for dysuria.  Musculoskeletal: Negative.  Negative for myalgias.  Skin: Negative.   Neurological: Positive for weakness.     Physical Exam Updated Vital Signs BP 126/77   Pulse (!) 107   Temp 99.4 F (37.4 C) (Rectal)   Resp 18   Ht 5\' 4"  (1.626 m)   Wt 77.1 kg   SpO2 97%   BMI 29.18 kg/m   Physical Exam Vitals signs and nursing note reviewed.  Constitutional:      Appearance: She is well-developed.  HENT:     Head: Normocephalic.     Mouth/Throat:     Mouth: Mucous membranes are dry.  Neck:     Musculoskeletal: Normal range of motion and neck supple.  Cardiovascular:     Rate and Rhythm: Regular rhythm. Tachycardia present.     Heart sounds: No murmur.  Pulmonary:     Effort: Pulmonary effort is normal.     Breath sounds: Normal  breath sounds. No wheezing, rhonchi or rales.  Chest:     Chest wall: No tenderness.  Abdominal:     General: Bowel sounds are normal.     Palpations: Abdomen is soft.     Tenderness: There  is no abdominal tenderness. There is no guarding or rebound.  Musculoskeletal: Normal range of motion.  Skin:    General: Skin is warm and dry.     Findings: No rash.  Neurological:     Mental Status: She is alert and oriented to person, place, and time.      ED Treatments / Results  Labs (all labs ordered are listed, but only abnormal results are displayed) Labs Reviewed  COMPREHENSIVE METABOLIC PANEL - Abnormal; Notable for the following components:      Result Value   Sodium 133 (*)    Glucose, Bld 116 (*)    All other components within normal limits  CBC WITH DIFFERENTIAL/PLATELET - Abnormal; Notable for the following components:   RBC 2.68 (*)    Hemoglobin 8.9 (*)    HCT 26.7 (*)    RDW 17.1 (*)    Abs Immature Granulocytes 0.08 (*)    All other components within normal limits  CULTURE, BLOOD (ROUTINE X 2)  CULTURE, BLOOD (ROUTINE X 2)  URINE CULTURE  LACTIC ACID, PLASMA  PROTIME-INR  URINALYSIS, ROUTINE W REFLEX MICROSCOPIC    EKG None  Radiology Dg Chest 2 View  Result Date: 02/10/2019 CLINICAL DATA:  Suspected sepsis EXAM: CHEST - 2 VIEW COMPARISON:  December 30, 2018 FINDINGS: The heart size and mediastinal contours are within normal limits. There is a right-sided MediPort catheter with the tip at the superior cavoatrial junction. Both lungs are clear. No acute osseous abnormality. IMPRESSION: No acute cardiopulmonary disease. Electronically Signed   By: Prudencio Pair M.D.   On: 02/10/2019 21:38    Procedures Procedures (including critical care time)  Medications Ordered in ED Medications  sodium chloride 0.9 % bolus 1,000 mL (has no administration in time range)     Initial Impression / Assessment and Plan / ED Course  I have reviewed the triage vital signs and the  nursing notes.  Pertinent labs & imaging results that were available during my care of the patient were reviewed by me and considered in my medical decision making (see chart for details).        Patient to ED with fatigue, decreased appetite, generalized weakness that is progressively worsening, nausea and fever to Tmax 102 x 1 day. History of endometrial cancer receiving chemo and radiation therapy. No pain.    The patient has a low grade temp on arrival, minimally tachycardic to 107. She is comfortable without complaint.   Labs do not identify a source of infection. CXR clear. COVID negative. On attempt to stand to clean the bed, the patient becomes significantly weak requiring assistance.   During ED encounter, the patient has received 1.5 liters fluid but tachycardia increased to 120's. She has significant chills/rigors. Temp rechecked and is 100.7, then on further recheck climbs to 103. Antibiotics for unknown source started (Vanc and cefepime). Tylenol provided for fever.   Discussed with oncology who advises she can be admitted to medicine. Discussed with Dr. Myna Hidalgo, [facility], who accepts for admission. Patient updated.    Final Clinical Impressions(s) / ED Diagnoses   Final diagnoses:  None   1. Febrile illness 2. Immunocompromised status  ED Discharge Orders    None       Charlann Lange, PA-C 02/11/19 0435    Malvin Johns, MD 02/13/19 1257

## 2019-02-11 ENCOUNTER — Encounter (HOSPITAL_COMMUNITY): Payer: Self-pay | Admitting: Family Medicine

## 2019-02-11 ENCOUNTER — Other Ambulatory Visit: Payer: Self-pay

## 2019-02-11 ENCOUNTER — Telehealth: Payer: Self-pay

## 2019-02-11 ENCOUNTER — Ambulatory Visit: Payer: PPO | Admitting: Radiation Oncology

## 2019-02-11 DIAGNOSIS — Z20828 Contact with and (suspected) exposure to other viral communicable diseases: Secondary | ICD-10-CM | POA: Diagnosis present

## 2019-02-11 DIAGNOSIS — E872 Acidosis: Secondary | ICD-10-CM

## 2019-02-11 DIAGNOSIS — R6510 Systemic inflammatory response syndrome (SIRS) of non-infectious origin without acute organ dysfunction: Secondary | ICD-10-CM | POA: Diagnosis not present

## 2019-02-11 DIAGNOSIS — E871 Hypo-osmolality and hyponatremia: Secondary | ICD-10-CM | POA: Diagnosis present

## 2019-02-11 DIAGNOSIS — N390 Urinary tract infection, site not specified: Secondary | ICD-10-CM | POA: Diagnosis present

## 2019-02-11 DIAGNOSIS — B957 Other staphylococcus as the cause of diseases classified elsewhere: Secondary | ICD-10-CM | POA: Diagnosis not present

## 2019-02-11 DIAGNOSIS — A411 Sepsis due to other specified staphylococcus: Secondary | ICD-10-CM | POA: Diagnosis present

## 2019-02-11 DIAGNOSIS — T80211A Bloodstream infection due to central venous catheter, initial encounter: Secondary | ICD-10-CM | POA: Diagnosis present

## 2019-02-11 DIAGNOSIS — Z791 Long term (current) use of non-steroidal anti-inflammatories (NSAID): Secondary | ICD-10-CM | POA: Diagnosis not present

## 2019-02-11 DIAGNOSIS — R739 Hyperglycemia, unspecified: Secondary | ICD-10-CM | POA: Diagnosis present

## 2019-02-11 DIAGNOSIS — K5900 Constipation, unspecified: Secondary | ICD-10-CM | POA: Diagnosis not present

## 2019-02-11 DIAGNOSIS — Z95828 Presence of other vascular implants and grafts: Secondary | ICD-10-CM | POA: Diagnosis not present

## 2019-02-11 DIAGNOSIS — Z803 Family history of malignant neoplasm of breast: Secondary | ICD-10-CM | POA: Diagnosis not present

## 2019-02-11 DIAGNOSIS — Z823 Family history of stroke: Secondary | ICD-10-CM | POA: Diagnosis not present

## 2019-02-11 DIAGNOSIS — Z8249 Family history of ischemic heart disease and other diseases of the circulatory system: Secondary | ICD-10-CM | POA: Diagnosis not present

## 2019-02-11 DIAGNOSIS — Z79891 Long term (current) use of opiate analgesic: Secondary | ICD-10-CM | POA: Diagnosis not present

## 2019-02-11 DIAGNOSIS — Z825 Family history of asthma and other chronic lower respiratory diseases: Secondary | ICD-10-CM | POA: Diagnosis not present

## 2019-02-11 DIAGNOSIS — E876 Hypokalemia: Secondary | ICD-10-CM | POA: Diagnosis present

## 2019-02-11 DIAGNOSIS — G620 Drug-induced polyneuropathy: Secondary | ICD-10-CM

## 2019-02-11 DIAGNOSIS — R7881 Bacteremia: Secondary | ICD-10-CM | POA: Diagnosis not present

## 2019-02-11 DIAGNOSIS — B9561 Methicillin susceptible Staphylococcus aureus infection as the cause of diseases classified elsewhere: Secondary | ICD-10-CM | POA: Diagnosis not present

## 2019-02-11 DIAGNOSIS — R32 Unspecified urinary incontinence: Secondary | ICD-10-CM | POA: Diagnosis present

## 2019-02-11 DIAGNOSIS — T451X5A Adverse effect of antineoplastic and immunosuppressive drugs, initial encounter: Secondary | ICD-10-CM | POA: Diagnosis present

## 2019-02-11 DIAGNOSIS — Z79899 Other long term (current) drug therapy: Secondary | ICD-10-CM | POA: Diagnosis not present

## 2019-02-11 DIAGNOSIS — Z881 Allergy status to other antibiotic agents status: Secondary | ICD-10-CM | POA: Diagnosis not present

## 2019-02-11 DIAGNOSIS — D6481 Anemia due to antineoplastic chemotherapy: Secondary | ICD-10-CM | POA: Diagnosis present

## 2019-02-11 DIAGNOSIS — D638 Anemia in other chronic diseases classified elsewhere: Secondary | ICD-10-CM | POA: Diagnosis present

## 2019-02-11 DIAGNOSIS — A4181 Sepsis due to Enterococcus: Secondary | ICD-10-CM | POA: Diagnosis present

## 2019-02-11 DIAGNOSIS — D539 Nutritional anemia, unspecified: Secondary | ICD-10-CM | POA: Diagnosis present

## 2019-02-11 DIAGNOSIS — R509 Fever, unspecified: Secondary | ICD-10-CM | POA: Diagnosis present

## 2019-02-11 DIAGNOSIS — C541 Malignant neoplasm of endometrium: Secondary | ICD-10-CM | POA: Diagnosis present

## 2019-02-11 LAB — CBC WITH DIFFERENTIAL/PLATELET
Abs Immature Granulocytes: 0.06 10*3/uL (ref 0.00–0.07)
Abs Immature Granulocytes: 0.08 10*3/uL — ABNORMAL HIGH (ref 0.00–0.07)
Basophils Absolute: 0 10*3/uL (ref 0.0–0.1)
Basophils Absolute: 0 10*3/uL (ref 0.0–0.1)
Basophils Relative: 0 %
Basophils Relative: 0 %
Eosinophils Absolute: 0 10*3/uL (ref 0.0–0.5)
Eosinophils Absolute: 0 10*3/uL (ref 0.0–0.5)
Eosinophils Relative: 0 %
Eosinophils Relative: 0 %
HCT: 21.6 % — ABNORMAL LOW (ref 36.0–46.0)
HCT: 22.9 % — ABNORMAL LOW (ref 36.0–46.0)
Hemoglobin: 7.1 g/dL — ABNORMAL LOW (ref 12.0–15.0)
Hemoglobin: 7.4 g/dL — ABNORMAL LOW (ref 12.0–15.0)
Immature Granulocytes: 1 %
Immature Granulocytes: 1 %
Lymphocytes Relative: 13 %
Lymphocytes Relative: 6 %
Lymphs Abs: 0.8 10*3/uL (ref 0.7–4.0)
Lymphs Abs: 0.5 10*3/uL — ABNORMAL LOW (ref 0.7–4.0)
MCH: 33.8 pg (ref 26.0–34.0)
MCH: 33.2 pg (ref 26.0–34.0)
MCHC: 32.9 g/dL (ref 30.0–36.0)
MCHC: 32.3 g/dL (ref 30.0–36.0)
MCV: 102.9 fL — ABNORMAL HIGH (ref 80.0–100.0)
MCV: 102.7 fL — ABNORMAL HIGH (ref 80.0–100.0)
Monocytes Absolute: 0.6 10*3/uL (ref 0.1–1.0)
Monocytes Absolute: 0.6 10*3/uL (ref 0.1–1.0)
Monocytes Relative: 10 %
Monocytes Relative: 8 %
Neutro Abs: 4.5 10*3/uL (ref 1.7–7.7)
Neutro Abs: 6.7 10*3/uL (ref 1.7–7.7)
Neutrophils Relative %: 76 %
Neutrophils Relative %: 85 %
Platelets: 206 10*3/uL (ref 150–400)
Platelets: 218 10*3/uL (ref 150–400)
RBC: 2.1 MIL/uL — ABNORMAL LOW (ref 3.87–5.11)
RBC: 2.23 MIL/uL — ABNORMAL LOW (ref 3.87–5.11)
RDW: 17.2 % — ABNORMAL HIGH (ref 11.5–15.5)
RDW: 17.2 % — ABNORMAL HIGH (ref 11.5–15.5)
WBC: 6.1 10*3/uL (ref 4.0–10.5)
WBC: 8 10*3/uL (ref 4.0–10.5)
nRBC: 0 % (ref 0.0–0.2)
nRBC: 0 % (ref 0.0–0.2)

## 2019-02-11 LAB — COMPREHENSIVE METABOLIC PANEL
ALT: 19 U/L (ref 0–44)
AST: 21 U/L (ref 15–41)
Albumin: 3.1 g/dL — ABNORMAL LOW (ref 3.5–5.0)
Alkaline Phosphatase: 87 U/L (ref 38–126)
Anion gap: 5 (ref 5–15)
BUN: 11 mg/dL (ref 8–23)
CO2: 21 mmol/L — ABNORMAL LOW (ref 22–32)
Calcium: 7.6 mg/dL — ABNORMAL LOW (ref 8.9–10.3)
Chloride: 111 mmol/L (ref 98–111)
Creatinine, Ser: 0.77 mg/dL (ref 0.44–1.00)
GFR calc Af Amer: >60 mL/min (ref >60)
GFR calc non Af Amer: >60 mL/min (ref >60)
Glucose, Bld: 99 mg/dL (ref 70–99)
Potassium: 3.6 mmol/L (ref 3.5–5.1)
Sodium: 137 mmol/L (ref 135–145)
Total Bilirubin: 0.6 mg/dL (ref 0.3–1.2)
Total Protein: 5.6 g/dL — ABNORMAL LOW (ref 6.5–8.1)

## 2019-02-11 LAB — BASIC METABOLIC PANEL
Anion gap: 10 (ref 5–15)
BUN: 11 mg/dL (ref 8–23)
CO2: 21 mmol/L — ABNORMAL LOW (ref 22–32)
Calcium: 8.2 mg/dL — ABNORMAL LOW (ref 8.9–10.3)
Chloride: 103 mmol/L (ref 98–111)
Creatinine, Ser: 0.81 mg/dL (ref 0.44–1.00)
GFR calc Af Amer: >60 mL/min (ref >60)
GFR calc non Af Amer: >60 mL/min (ref >60)
Glucose, Bld: 127 mg/dL — ABNORMAL HIGH (ref 70–99)
Potassium: 3.5 mmol/L (ref 3.5–5.1)
Sodium: 134 mmol/L — ABNORMAL LOW (ref 135–145)

## 2019-02-11 LAB — BLOOD CULTURE ID PANEL (REFLEXED)

## 2019-02-11 LAB — URINALYSIS, ROUTINE W REFLEX MICROSCOPIC
Bilirubin Urine: NEGATIVE
Glucose, UA: NEGATIVE mg/dL
Hgb urine dipstick: NEGATIVE
Ketones, ur: NEGATIVE mg/dL
Nitrite: NEGATIVE
Protein, ur: NEGATIVE mg/dL
Specific Gravity, Urine: 1.005 (ref 1.005–1.030)
pH: 6 (ref 5.0–8.0)

## 2019-02-11 LAB — EXPECTORATED SPUTUM ASSESSMENT W GRAM STAIN, RFLX TO RESP C

## 2019-02-11 LAB — SARS CORONAVIRUS 2 BY RT PCR (HOSPITAL ORDER, PERFORMED IN ~~LOC~~ HOSPITAL LAB): SARS Coronavirus 2: NEGATIVE

## 2019-02-11 LAB — PHOSPHORUS: Phosphorus: 4 mg/dL (ref 2.5–4.6)

## 2019-02-11 LAB — MAGNESIUM: Magnesium: 1.7 mg/dL (ref 1.7–2.4)

## 2019-02-11 MED ORDER — ONDANSETRON HCL 4 MG/2ML IJ SOLN
4.0000 mg | Freq: Four times a day (QID) | INTRAMUSCULAR | Status: DC | PRN
  Administered 2019-02-11 – 2019-02-12 (×3): 4 mg via INTRAVENOUS
  Filled 2019-02-11 (×3): qty 2

## 2019-02-11 MED ORDER — SODIUM CHLORIDE 0.9 % IV SOLN
INTRAVENOUS | Status: AC
  Administered 2019-02-11: 13:00:00 via INTRAVENOUS

## 2019-02-11 MED ORDER — VANCOMYCIN HCL 10 G IV SOLR
1250.0000 mg | INTRAVENOUS | Status: DC
  Administered 2019-02-11 – 2019-02-13 (×3): 1250 mg via INTRAVENOUS
  Filled 2019-02-11 (×4): qty 1250

## 2019-02-11 MED ORDER — GABAPENTIN 300 MG PO CAPS
300.0000 mg | ORAL_CAPSULE | Freq: Two times a day (BID) | ORAL | Status: DC
  Administered 2019-02-11 – 2019-02-17 (×12): 300 mg via ORAL
  Filled 2019-02-11 (×12): qty 1

## 2019-02-11 MED ORDER — SODIUM CHLORIDE 0.9% FLUSH
3.0000 mL | Freq: Two times a day (BID) | INTRAVENOUS | Status: DC
  Administered 2019-02-11 – 2019-02-15 (×5): 3 mL via INTRAVENOUS

## 2019-02-11 MED ORDER — SODIUM CHLORIDE 0.9 % IV SOLN
2.0000 g | Freq: Three times a day (TID) | INTRAVENOUS | Status: DC
  Administered 2019-02-11 – 2019-02-12 (×3): 2 g via INTRAVENOUS
  Filled 2019-02-11 (×5): qty 2

## 2019-02-11 MED ORDER — MELATONIN 3 MG PO TABS
3.0000 mg | ORAL_TABLET | Freq: Every evening | ORAL | Status: DC | PRN
  Filled 2019-02-11: qty 1

## 2019-02-11 MED ORDER — VANCOMYCIN HCL 10 G IV SOLR
1250.0000 mg | Freq: Once | INTRAVENOUS | Status: AC
  Administered 2019-02-11: 1250 mg via INTRAVENOUS
  Filled 2019-02-11: qty 1250

## 2019-02-11 MED ORDER — SODIUM CHLORIDE 0.9 % IV BOLUS
500.0000 mL | Freq: Once | INTRAVENOUS | Status: AC
  Administered 2019-02-11: 500 mL via INTRAVENOUS

## 2019-02-11 MED ORDER — SODIUM CHLORIDE 0.9 % IV SOLN: 2.0000 g | Freq: Two times a day (BID) | INTRAVENOUS | Status: DC

## 2019-02-11 MED ORDER — ONDANSETRON HCL 4 MG PO TABS: 4.0000 mg | ORAL_TABLET | Freq: Four times a day (QID) | ORAL | Status: DC | PRN

## 2019-02-11 MED ORDER — ACETAMINOPHEN 650 MG RE SUPP
650.0000 mg | Freq: Four times a day (QID) | RECTAL | Status: DC | PRN
  Administered 2019-02-11: 650 mg via RECTAL
  Filled 2019-02-11: qty 1

## 2019-02-11 MED ORDER — METRONIDAZOLE IN NACL 5-0.79 MG/ML-% IV SOLN
500.0000 mg | Freq: Three times a day (TID) | INTRAVENOUS | Status: DC
  Administered 2019-02-11 – 2019-02-12 (×4): 500 mg via INTRAVENOUS
  Filled 2019-02-11 (×4): qty 100

## 2019-02-11 MED ORDER — TRAMADOL HCL 50 MG PO TABS: 50.0000 mg | ORAL_TABLET | Freq: Four times a day (QID) | ORAL | Status: DC | PRN

## 2019-02-11 MED ORDER — ONDANSETRON HCL 4 MG/2ML IJ SOLN
4.0000 mg | Freq: Once | INTRAMUSCULAR | Status: AC
  Administered 2019-02-11: 4 mg via INTRAVENOUS
  Filled 2019-02-11: qty 2

## 2019-02-11 MED ORDER — SODIUM CHLORIDE 0.9 % IV BOLUS
1000.0000 mL | Freq: Once | INTRAVENOUS | Status: AC
  Administered 2019-02-11: 1000 mL via INTRAVENOUS

## 2019-02-11 MED ORDER — GUAIFENESIN-DM 100-10 MG/5ML PO SYRP: 5.0000 mL | ORAL_SOLUTION | ORAL | Status: DC | PRN

## 2019-02-11 MED ORDER — ENOXAPARIN SODIUM 40 MG/0.4ML ~~LOC~~ SOLN
40.0000 mg | SUBCUTANEOUS | Status: DC
  Administered 2019-02-11 – 2019-02-12 (×2): 40 mg via SUBCUTANEOUS
  Filled 2019-02-11 (×2): qty 0.4

## 2019-02-11 MED ORDER — POLYETHYLENE GLYCOL 3350 17 G PO PACK: 17.0000 g | PACK | Freq: Every day | ORAL | Status: DC | PRN

## 2019-02-11 MED ORDER — MORPHINE SULFATE (PF) 4 MG/ML IV SOLN
3.0000 mg | INTRAVENOUS | Status: DC | PRN
  Administered 2019-02-12: 3 mg via INTRAVENOUS
  Filled 2019-02-11: qty 1

## 2019-02-11 MED ORDER — SODIUM CHLORIDE 0.9 % IV SOLN
2.0000 g | Freq: Once | INTRAVENOUS | Status: AC
  Administered 2019-02-11: 04:00:00 2 g via INTRAVENOUS
  Filled 2019-02-11: qty 2

## 2019-02-11 MED ORDER — ACETAMINOPHEN 500 MG PO TABS
1000.0000 mg | ORAL_TABLET | Freq: Once | ORAL | Status: AC
  Administered 2019-02-11: 04:00:00 1000 mg via ORAL
  Filled 2019-02-11: qty 2

## 2019-02-11 MED ORDER — ACETAMINOPHEN 325 MG PO TABS
650.0000 mg | ORAL_TABLET | Freq: Four times a day (QID) | ORAL | Status: DC | PRN
  Administered 2019-02-11 – 2019-02-12 (×3): 650 mg via ORAL
  Filled 2019-02-11 (×5): qty 2

## 2019-02-11 NOTE — ED Notes (Signed)
ED TO INPATIENT HANDOFF REPORT  ED Nurse Name and Phone #: [phone] [place] Name/Age/Gender Zoe Kim 73 y.o. female Room/Bed: WA24/WA24  Code Status   Code Status: Prior  Home/SNF/Other Home Patient oriented to: self, place, time and situation Is this baseline? Yes   Triage Complete: Triage complete  Chief Complaint Cancer pt,Fever 102.7, Dehydrated  Triage Note Patient states she endometrial cancer, receives chemotherapy and radiation, but complaining of fever and fatigue. Symptoms started today. Patient states she took Tylenol 2 tablets around 19:00.    Allergies Allergies  Allergen Reactions  . Sulfa Antibiotics     UNSPECIFIED REACTION     Level of Care/Admitting Diagnosis ED Disposition    ED Disposition Condition Comment   Admit  Hospital Area: [place] [[id number]]  Level of Care: Telemetry [5]  Admit to tele based on following criteria: Complex arrhythmia (Bradycardia/Tachycardia)  Covid Evaluation: Confirmed COVID Negative  Diagnosis: SIRS (systemic inflammatory response syndrome) ([place]) [id number]  Admitting Physician: Vianne Bulls [id number]  Attending Physician: Vianne Bulls [id number]  PT Class (Do Not Modify): Observation [104]  PT Acc Code (Do Not Modify): Observation [10022]       B Medical/Surgery History Past Medical History:  Diagnosis Date  . Anxiety   . Arthritis   . Depression   . Endometrial ca ([place])   . Family history of breast cancer   . Family history of lung cancer   . Heart murmur    "years ago"  . Insomnia   . Pneumonia    "long time ago"  . Primary localized osteoarthrosis of the knee, right   . Urinary urgency    Past Surgical History:  Procedure Laterality Date  . COLONOSCOPY    . EYE SURGERY Bilateral    cataract with lens  . IR IMAGING GUIDED PORT INSERTION  11/06/2018  . ROBOTIC ASSISTED TOTAL HYSTERECTOMY WITH BILATERAL SALPINGO OOPHERECTOMY N/A 10/14/2018   Procedure:  ROBOTIC ASSISTED TOTAL HYSTERECTOMY WITH BILATERAL SALPINGO OOPHORECTOMY;  Surgeon: Everitt Amber, MD;  Location: [facility];  Service: Gynecology;  Laterality: N/A;  . SENTINEL NODE BIOPSY N/A 10/14/2018   Procedure: SENTINEL LYMPH NODE BIOPSY;  Surgeon: Everitt Amber, MD;  Location: [facility];  Service: Gynecology;  Laterality: N/A;  . TONSILLECTOMY    . TOOTH EXTRACTION Right 08/27/2017  . TOTAL KNEE ARTHROPLASTY Right 07/30/2016   Procedure: TOTAL KNEE ARTHROPLASTY;  Surgeon: Elsie Saas, MD;  Location: [facility];  Service: Orthopedics;  Laterality: Right;  . VEIN LIGATION AND STRIPPING       A IV Location/Drains/Wounds Patient Lines/Drains/Airways Status   Active Line/Drains/Airways    Name:   Placement date:   Placement time:   Site:   Days:   Implanted Port 11/06/18 Right Chest   11/06/18    1724    Chest   97   External Urinary Catheter   02/11/19    0200    -   less than 1   Incision (Closed) 07/30/16 Knee   07/30/16    0911     926   Incision (Closed) 10/14/18 Abdomen   10/14/18    0740     120   Incision (Closed) 10/14/18 Perineum   10/14/18    0951     120   Incision - 5 Ports Abdomen Right;Lateral Left;Lateral;Upper Upper;Umbilicus Left;Lateral;Lower Left;Lateral;Medial   10/14/18    0742     120          Intake/Output Last  24 hours  Intake/Output Summary (Last 24 hours) at 02/11/2019 1210 Last data filed at 02/11/2019 0800 Gross per 24 hour  Intake 2950 ml  Output -  Net 2950 ml    Labs/Imaging Results for orders placed or performed during the hospital encounter of 02/10/19 (from the past 48 hour(s))  Comprehensive metabolic panel     Status: Abnormal   Collection Time: 02/10/19  9:16 PM  Result Value Ref Range   Sodium 133 (L) 135 - 145 mmol/L   Potassium 4.0 3.5 - 5.1 mmol/L   Chloride 98 98 - 111 mmol/L   CO2 23 22 - 32 mmol/L   Glucose, Bld 116 (H) 70 - 99 mg/dL   BUN 13 8 - 23 mg/dL   Creatinine, Ser 0.91 0.44 - 1.00 mg/dL   Calcium 9.4 8.9 - 10.3 mg/dL   Total  Protein 7.3 6.5 - 8.1 g/dL   Albumin 4.3 3.5 - 5.0 g/dL   AST 33 15 - 41 U/L   ALT 27 0 - 44 U/L   Alkaline Phosphatase 121 38 - 126 U/L   Total Bilirubin 0.8 0.3 - 1.2 mg/dL   GFR calc non Af Amer >60 >60 mL/min   GFR calc Af Amer >60 >60 mL/min   Anion gap 12 5 - 15    Comment: Performed at [facility], [place] [address], [place], [place] [address]  Lactic acid, plasma     Status: None   Collection Time: 02/10/19  9:16 PM  Result Value Ref Range   Lactic Acid, Venous 1.2 0.5 - 1.9 mmol/L    Comment: Performed at [facility], [place] [address], [place], [place] [address]  CBC with Differential     Status: Abnormal   Collection Time: 02/10/19  9:16 PM  Result Value Ref Range   WBC 7.3 4.0 - 10.5 K/uL   RBC 2.68 (L) 3.87 - 5.11 MIL/uL   Hemoglobin 8.9 (L) 12.0 - 15.0 g/dL   HCT 26.7 (L) 36.0 - 46.0 %   MCV 99.6 80.0 - 100.0 fL   MCH 33.2 26.0 - 34.0 pg   MCHC 33.3 30.0 - 36.0 g/dL   RDW 17.1 (H) 11.5 - 15.5 %   Platelets 295 150 - 400 K/uL   nRBC 0.0 0.0 - 0.2 %   Neutrophils Relative % 75 %   Neutro Abs 5.4 1.7 - 7.7 K/uL   Lymphocytes Relative 14 %   Lymphs Abs 1.0 0.7 - 4.0 K/uL   Monocytes Relative 10 %   Monocytes Absolute 0.7 0.1 - 1.0 K/uL   Eosinophils Relative 0 %   Eosinophils Absolute 0.0 0.0 - 0.5 K/uL   Basophils Relative 0 %   Basophils Absolute 0.0 0.0 - 0.1 K/uL   Immature Granulocytes 1 %   Abs Immature Granulocytes 0.08 (H) 0.00 - 0.07 K/uL    Comment: Performed at [facility], [place] [address], [place], [place] [address]  Protime-INR     Status: None   Collection Time: 02/10/19  9:16 PM  Result Value Ref Range   Prothrombin Time 14.0 11.4 - 15.2 seconds   INR 1.1 0.8 - 1.2    Comment: (NOTE) INR goal varies based on device and disease states. Performed at [facility], [place] [address] [place], [place] [address]   SARS Coronavirus 2 [facility] order, Performed in [facility]  hospital lab) Nasopharyngeal Nasopharyngeal Swab     Status: None   Collection Time: 02/10/19 11:23  PM   Specimen: Nasopharyngeal Swab  Result Value Ref Range   SARS Coronavirus 2 NEGATIVE NEGATIVE    Comment: (NOTE) If result is NEGATIVE SARS-CoV-2 target nucleic acids are NOT DETECTED. The SARS-CoV-2 RNA is generally detectable in upper and lower  respiratory specimens during the acute phase of infection. The lowest  concentration of SARS-CoV-2 viral copies this assay can detect is 250  copies / mL. A negative result does not preclude SARS-CoV-2 infection  and should not be used as the sole basis for treatment or other  patient management decisions.  A negative result may occur with  improper specimen collection / handling, submission of specimen other  than nasopharyngeal swab, presence of viral mutation(s) within the  areas targeted by this assay, and inadequate number of viral copies  (<250 copies / mL). A negative result must be combined with clinical  observations, patient history, and epidemiological information. If result is POSITIVE SARS-CoV-2 target nucleic acids are DETECTED. The SARS-CoV-2 RNA is generally detectable in upper and lower  respiratory specimens dur ing the acute phase of infection.  Positive  results are indicative of active infection with SARS-CoV-2.  Clinical  correlation with patient history and other diagnostic information is  necessary to determine patient infection status.  Positive results do  not rule out bacterial infection or co-infection with other viruses. If result is PRESUMPTIVE POSTIVE SARS-CoV-2 nucleic acids MAY BE PRESENT.   A presumptive positive result was obtained on the submitted specimen  and confirmed on repeat testing.  While 2019 novel coronavirus  (SARS-CoV-2) nucleic acids may be present in the submitted sample  additional confirmatory testing may be necessary for epidemiological  and / or clinical management purposes  to  differentiate between  SARS-CoV-2 and other Sarbecovirus currently known to infect humans.  If clinically indicated additional testing with an alternate test  methodology [phone]) is advised. The SARS-CoV-2 RNA is generally  detectable in upper and lower respiratory sp ecimens during the acute  phase of infection. The expected result is Negative. Fact Sheet for Patients:  StrictlyIdeas.no Fact Sheet for Healthcare Providers: BankingDealers.co.za This test is not yet approved or cleared by the [place] FDA and has been authorized for detection and/or diagnosis of SARS-CoV-2 by FDA under an Emergency Use Authorization (EUA).  This EUA will remain in effect (meaning this test can be used) for the duration of the COVID-19 declaration under Section 564(b)(1) of the Act, 21 U.S.C. section 360bbb-3(b)(1), unless the authorization is terminated or revoked sooner. Performed at [facility], [place] [address], [place], [place] [address]   Urinalysis, Routine w reflex microscopic     Status: Abnormal   Collection Time: 02/11/19  2:00 AM  Result Value Ref Range   Color, Urine STRAW (A) YELLOW   APPearance CLEAR CLEAR   Specific Gravity, Urine 1.005 1.005 - 1.030   pH 6.0 5.0 - 8.0   Glucose, UA NEGATIVE NEGATIVE mg/dL   Hgb urine dipstick NEGATIVE NEGATIVE   Bilirubin Urine NEGATIVE NEGATIVE   Ketones, ur NEGATIVE NEGATIVE mg/dL   Protein, ur NEGATIVE NEGATIVE mg/dL   Nitrite NEGATIVE NEGATIVE   Leukocytes,Ua MODERATE (A) NEGATIVE   RBC / HPF 0-5 0 - 5 RBC/hpf   WBC, UA 11-20 0 - 5 WBC/hpf   Bacteria, UA RARE (A) NONE SEEN    Comment: Performed at [facility], [place] [address], [place], [place] [address]  CBC with Differential/Platelet     Status: Abnormal   Collection Time: 02/11/19  9:23 AM  Result Value Ref Range   WBC 6.1 4.0 - 10.5 K/uL   RBC 2.10 (L) 3.87 - 5.11 MIL/uL   Hemoglobin 7.1 (L) 12.0  - 15.0 g/dL   HCT 21.6 (L) 36.0 - 46.0 %   MCV 102.9 (H) 80.0 - 100.0 fL   MCH 33.8 26.0 - 34.0 pg   MCHC 32.9 30.0 - 36.0 g/dL   RDW 17.2 (H) 11.5 - 15.5 %   Platelets 206 150 - 400 K/uL   nRBC 0.0 0.0 - 0.2 %   Neutrophils Relative % 76 %   Neutro Abs 4.5 1.7 - 7.7 K/uL   Lymphocytes Relative 13 %   Lymphs Abs 0.8 0.7 - 4.0 K/uL   Monocytes Relative 10 %   Monocytes Absolute 0.6 0.1 - 1.0 K/uL   Eosinophils Relative 0 %   Eosinophils Absolute 0.0 0.0 - 0.5 K/uL   Basophils Relative 0 %   Basophils Absolute 0.0 0.0 - 0.1 K/uL   Immature Granulocytes 1 %   Abs Immature Granulocytes 0.06 0.00 - 0.07 K/uL    Comment: Performed at [facility], [place] [address], [place], [place] [address]  Comprehensive metabolic panel     Status: Abnormal   Collection Time: 02/11/19  9:23 AM  Result Value Ref Range   Sodium 137 135 - 145 mmol/L   Potassium 3.6 3.5 - 5.1 mmol/L   Chloride 111 98 - 111 mmol/L   CO2 21 (L) 22 - 32 mmol/L   Glucose, Bld 99 70 - 99 mg/dL   BUN 11 8 - 23 mg/dL   Creatinine, Ser 0.77 0.44 - 1.00 mg/dL   Calcium 7.6 (L) 8.9 - 10.3 mg/dL   Total Protein 5.6 (L) 6.5 - 8.1 g/dL   Albumin 3.1 (L) 3.5 - 5.0 g/dL   AST 21 15 - 41 U/L   ALT 19 0 - 44 U/L   Alkaline Phosphatase 87 38 - 126 U/L   Total Bilirubin 0.6 0.3 - 1.2 mg/dL   GFR calc non Af Amer >60 >60 mL/min   GFR calc Af Amer >60 >60 mL/min   Anion gap 5 5 - 15    Comment: Performed at [facility], [place] [address] [place], [place] [address]  Magnesium     Status: None   Collection Time: 02/11/19  9:23 AM  Result Value Ref Range   Magnesium 1.7 1.7 - 2.4 mg/dL    Comment: Performed at [facility], [place] [address] [place], [place] [address]  Phosphorus     Status: None   Collection Time: 02/11/19  9:23 AM  Result Value Ref Range   Phosphorus 4.0 2.5 - 4.6 mg/dL    Comment: Performed at [facility], [place] [address], [place],  [place] [address]  Culture, sputum-assessment     Status: None   Collection Time: 02/11/19  9:41 AM   Specimen: Sputum  Result Value Ref Range   Specimen Description SPUTUM    Special Requests NONE    Sputum evaluation      Sputum specimen not acceptable for testing.  Please recollect.   NOTIFIED Pauline Good [id number] Performed at [facility], [place] [address], [place],  [address]    Report Status 02/11/2019 FINAL    Dg Chest 2 View  Result Date: 02/10/2019 CLINICAL DATA:  Suspected sepsis EXAM: CHEST - 2 VIEW COMPARISON:  December 30, 2018 FINDINGS: The heart size and mediastinal contours are  within normal limits. There is a right-sided MediPort catheter with the tip at the superior cavoatrial junction. Both lungs are clear. No acute osseous abnormality. IMPRESSION: No acute cardiopulmonary disease. Electronically Signed   By: Prudencio Pair M.D.   On: 02/10/2019 21:38    Pending Labs Unresulted Labs (From admission, onward)    Start     Ordered   02/12/19 0500  Magnesium  Tomorrow morning,   R     02/11/19 1206   02/12/19 0500  Phosphorus  Tomorrow morning,   R     02/11/19 1206   02/12/19 0500  CBC with Differential/Platelet  Tomorrow morning,   R     02/11/19 1206   02/12/19 0500  Comprehensive metabolic panel  Tomorrow morning,   R     02/11/19 1206   02/10/19 2237  Urine culture  ONCE - STAT,   STAT     02/10/19 2237   02/10/19 2116  Culture, blood (Routine x 2)  BLOOD CULTURE X 2,   STAT     02/10/19 2115   Signed and Held  Creatinine, serum  (enoxaparin (LOVENOX)    CrCl >/= 30 ml/min)  Weekly,   R    Comments: while on enoxaparin therapy    Signed and Held   Signed and Held  Basic metabolic panel  Tomorrow morning,   R     Signed and Held   Signed and Held  CBC WITH DIFFERENTIAL  Tomorrow morning,   R     Signed and Held          Vitals/Pain Today's Vitals   02/11/19 0600 02/11/19 0759 02/11/19 0933 02/11/19 1000  BP: 127/76 137/71 118/68 126/71   Pulse: (!) 112 99 96 97  Resp: 13 16 16  (!) 23  Temp:      TempSrc:      SpO2: 97% 98% 100% 100%  Weight:      Height:      PainSc:        Isolation Precautions No active isolations  Medications Medications  traMADol (ULTRAM) tablet 50 mg (has no administration in time range)  sodium chloride flush (NS) 0.9 % injection 3 mL (has no administration in time range)  acetaminophen (TYLENOL) tablet 650 mg (has no administration in time range)    Or  acetaminophen (TYLENOL) suppository 650 mg (has no administration in time range)  morphine 4 MG/ML injection 3 mg (has no administration in time range)  ondansetron (ZOFRAN) tablet 4 mg (has no administration in time range)    Or  ondansetron (ZOFRAN) injection 4 mg (has no administration in time range)  metroNIDAZOLE (FLAGYL) IVPB 500 mg (0 mg Intravenous Stopped 02/11/19 0800)  ceFEPIme (MAXIPIME) 2 g in sodium chloride 0.9 % 100 mL IVPB (has no administration in time range)  vancomycin (VANCOCIN) 1,250 mg in sodium chloride 0.9 % 250 mL IVPB (has no administration in time range)  sodium chloride 0.9 % bolus 1,000 mL (0 mLs Intravenous Stopped 02/11/19 0110)  ondansetron (ZOFRAN) injection 4 mg (4 mg Intravenous Given 02/11/19 0214)  sodium chloride 0.9 % bolus 500 mL (0 mLs Intravenous Stopped 02/11/19 0338)  ceFEPIme (MAXIPIME) 2 g in sodium chloride 0.9 % 100 mL IVPB (0 g Intravenous Stopped 02/11/19 0443)  acetaminophen (TYLENOL) tablet 1,000 mg (1,000 mg Oral Given 02/11/19 0403)  sodium chloride 0.9 % bolus 1,000 mL (0 mLs Intravenous Stopped 02/11/19 0639)  vancomycin (VANCOCIN) 1,250 mg in sodium chloride 0.9 % 250 mL IVPB (0  mg Intravenous Stopped 02/11/19 0759)    Mobility walks with device Low fall risk   Focused Assessments    R Recommendations: See Admitting Provider Note  Report given to:   Additional Notes:

## 2019-02-11 NOTE — Progress Notes (Signed)
Pharmacy Antibiotic Note  Zoe Kim is a 73 y.o. female admitted on 02/10/2019 with sepsis.  Pharmacy has been consulted for Vancomycin and cefepime dosing.  Patient has PMH significant for endometrial cancer and is currently undergoing treatment with chemotherapy and radiation.   Today, 02/11/19  WBC 6.1, ANC 4.5  SCr 0.8 - WNL. CrCl 62 mL/min  Tmax 103  Plan:  Increase cefepime to 2 g IV q8h  Continue vancomycin 1250 mg IV q24h  Goal AUC 400-550  Follow renal function and culture data  Check vancomycin levels at steady state as indicated   Height: 5\' 4"  (162.6 cm) Weight: 170 lb (77.1 kg) IBW/kg (Calculated) : 54.7  Temp (24hrs), Avg:100.2 F (37.9 C), Min:98.1 F (36.7 C), Max:103 F (39.4 C)  Recent Labs  Lab 02/10/19 2116 02/11/19 0923  WBC 7.3 6.1  CREATININE 0.91 0.77  LATICACIDVEN 1.2  --     Estimated Creatinine Clearance: 62 mL/min (by C-G formula based on SCr of 0.77 mg/dL).    Allergies  Allergen Reactions  . Sulfa Antibiotics     UNSPECIFIED REACTION     Antimicrobials this admission: Vancomycin 02/11/2019 >> Cefepime 02/11/2019 >>   Dose adjustments this admission:  Microbiology results: 9/9 Sputum: Sample not acceptable for testing 9/9 UCx: Sent 9/8 BCx: Sent 9/8 SARS-2: Negative  Thank you for allowing pharmacy to be a part of this patient's care.  Lenis Noon, PharmD 02/11/2019 12:14 PM

## 2019-02-11 NOTE — Progress Notes (Signed)
Pharmacy Antibiotic Note  Zoe Kim is a 73 y.o. female admitted on 02/10/2019 with sepsis.  Pharmacy has been consulted for Vancomycin, cefepime dosing.  Plan: Vancomycin 1.25gm iv x1, then Vancomycin 1250 mg IV Q 24 hrs. Goal AUC 400-550. Expected AUC: 520 SCr used: 0.91  Cefepime 2gm iv x1, then 2gm iv q12hr   Height: 5\' 4"  (162.6 cm) Weight: 170 lb (77.1 kg) IBW/kg (Calculated) : 54.7  Temp (24hrs), Avg:100.2 F (37.9 C), Min:98.1 F (36.7 C), Max:103 F (39.4 C)  Recent Labs  Lab 02/10/19 2116  WBC 7.3  CREATININE 0.91  LATICACIDVEN 1.2    Estimated Creatinine Clearance: 54.5 mL/min (by C-G formula based on SCr of 0.91 mg/dL).    Allergies  Allergen Reactions  . Sulfa Antibiotics     UNSPECIFIED REACTION     Antimicrobials this admission: Vancomycin 02/11/2019 >> Cefepime 02/11/2019 >>   Dose adjustments this admission: -  Microbiology results: -  Thank you for allowing pharmacy to be a part of this patient's care.  Nani Skillern Crowford 02/11/2019 5:34 AM

## 2019-02-11 NOTE — ED Notes (Signed)
Patient just urinated in brief. Will try to get urine next occurrence.

## 2019-02-11 NOTE — H&P (Signed)
History and Physical    Zoe Kim [id number] DOB: 02-04-1946 DOA: 02/10/2019  PCP: [name], MD   Patient coming from: Home   Chief Complaint: Fever, fatigue, generalized weakness   HPI: Zoe Kim is a 73 y.o. female with medical history significant for endometrial cancer undergoing treatment with radiation and chemotherapy complicated by peripheral neuropathy and anemia, now presenting to the emergency department with 1 day of fevers, fatigue, and generalized weakness.  Patient reports that she woke with fevers, chills, and malaise the morning of 02/10/2019.  She reports similar symptoms intermittently over the past month or more, but felt that she was doing better recently until yesterday morning.  She does not know of any sick contacts, has had a mild cough with some sputum, but not particularly short of breath.  She denies any abdominal pain, vomiting, diarrhea, meningismus, rash, or boils.  She denies any redness or tenderness around her port and states that it has been functioning properly.  ED Course: Upon arrival to the ED, patient is found to be febrile to 39.4 C, saturating adequately on room air, tachycardic in the 120s, and with stable blood pressure.  EKG features sinus tachycardia with rate 119, PVCs, and LAFB.  Chest x-ray is negative for acute cardiopulmonary disease.  Chemistry panel notable for mild hyponatremia and CBC features a chronic stable normocytic anemia.  COVID-19 testing is negative.  Urinalysis with rare bacteria, moderate leukocytes, and 11-20 WBC per hpf.  Patient was given 1.5 L normal saline, Ultram, acetaminophen, Zofran, vancomycin, and cefepime in the emergency department and hospitalists are asked to admit.  Review of Systems:  All other systems reviewed and apart from HPI, are negative.  Past Medical History:  Diagnosis Date  . Anxiety   . Arthritis   . Depression   . Endometrial ca ([place])   . Family history of breast cancer    . Family history of lung cancer   . Heart murmur    "years ago"  . Insomnia   . Pneumonia    "long time ago"  . Primary localized osteoarthrosis of the knee, right   . Urinary urgency     Past Surgical History:  Procedure Laterality Date  . COLONOSCOPY    . EYE SURGERY Bilateral    cataract with lens  . IR IMAGING GUIDED PORT INSERTION  11/06/2018  . ROBOTIC ASSISTED TOTAL HYSTERECTOMY WITH BILATERAL SALPINGO OOPHERECTOMY N/A 10/14/2018   Procedure: ROBOTIC ASSISTED TOTAL HYSTERECTOMY WITH BILATERAL SALPINGO OOPHORECTOMY;  Surgeon: Everitt Amber, MD;  Location: [facility];  Service: Gynecology;  Laterality: N/A;  . SENTINEL NODE BIOPSY N/A 10/14/2018   Procedure: SENTINEL LYMPH NODE BIOPSY;  Surgeon: Everitt Amber, MD;  Location: [facility];  Service: Gynecology;  Laterality: N/A;  . TONSILLECTOMY    . TOOTH EXTRACTION Right 08/27/2017  . TOTAL KNEE ARTHROPLASTY Right 07/30/2016   Procedure: TOTAL KNEE ARTHROPLASTY;  Surgeon: Elsie Saas, MD;  Location: [facility];  Service: Orthopedics;  Laterality: Right;  . VEIN LIGATION AND STRIPPING       reports that she has never smoked. She has never used smokeless tobacco. She reports that she does not drink alcohol or use drugs.  Allergies  Allergen Reactions  . Sulfa Antibiotics     UNSPECIFIED REACTION     Family History  Problem Relation Age of Onset  . Heart disease Mother   . COPD Mother   . Cancer Mother        uterine (possibly, pt unsure)  .  Breast cancer Mother        dx in 69s  . Heart disease Father   . COPD Brother   . Heart disease Brother   . Cancer Brother 20       lung ca, smoker  . Cancer Maternal Grandmother        breast dx early 76s  . Stroke Maternal Grandfather      Prior to Admission medications   Medication Sig Start Date End Date Taking? Authorizing Provider  acetaminophen (TYLENOL) 500 MG tablet Take 1,000 mg by mouth every 6 (six) hours as needed for moderate pain.   Yes [provider]  gabapentin  (NEURONTIN) 300 MG capsule Take 1 capsule (300 mg total) by mouth 2 (two) times daily. 02/03/19  Yes Gorsuch, Ni, MD  ibuprofen (ADVIL) 600 MG tablet Take 1 tablet (600 mg total) by mouth every 6 (six) hours as needed for moderate pain. For AFTER surgery 10/01/18  Yes Cross, Melissa D, NP  MELATONIN PO Take 1 tablet by mouth at bedtime as needed (sleep).   Yes [provider]  traMADol (ULTRAM) 50 MG tablet Take 1 tablet (50 mg total) by mouth every 6 (six) hours as needed. Patient taking differently: Take 50 mg by mouth every 6 (six) hours as needed for moderate pain.  11/17/18  Yes Heath Lark, MD    Physical Exam: Vitals:   02/11/19 0234 02/11/19 0239 02/11/19 0300 02/11/19 0349  BP: (!) 154/88  133/81   Pulse: (!) 123  (!) 122   Resp: 20  (!) 21   Temp:  (!) 100.7 F (38.2 C)  (!) 103 F (39.4 C)  TempSrc:  Axillary  Axillary  SpO2: 100%  98%   Weight:      Height:        Constitutional: NAD, calm  Eyes: PERTLA, lids and conjunctivae normal ENMT: Mucous membranes are moist. Posterior pharynx clear of any exudate or lesions.   Neck: normal, supple, no masses, no thyromegaly Respiratory: no wheezing, no crackles. Normal respiratory effort. No accessory muscle use.  Cardiovascular: Rate ~120 and regular. No extremity edema.  Abdomen: No distension, no tenderness, soft. Bowel sounds active.  Musculoskeletal: no clubbing / cyanosis. No joint deformity upper and lower extremities.  Skin: no significant rashes, lesions, ulcers. Warm, dry, well-perfused. Neurologic: CN 2-12 grossly intact. Strength 5/5 in all 4 limbs.  Psychiatric: Alert and oriented x 3. Very pleasant, cooperative.    Labs on Admission: I have personally reviewed following labs and imaging studies  CBC: Recent Labs  Lab 02/10/19 2116  WBC 7.3  NEUTROABS 5.4  HGB 8.9*  HCT 26.7*  MCV 99.6  PLT [id number]   Basic Metabolic Panel: Recent Labs  Lab 02/10/19 2116  NA 133*  K 4.0  CL 98  CO2 23  GLUCOSE  116*  BUN 13  CREATININE 0.91  CALCIUM 9.4   GFR: Estimated Creatinine Clearance: 54.5 mL/min (by C-G formula based on SCr of 0.91 mg/dL). Liver Function Tests: Recent Labs  Lab 02/10/19 2116  AST 33  ALT 27  ALKPHOS 121  BILITOT 0.8  PROT 7.3  ALBUMIN 4.3   No results for input(s): LIPASE, AMYLASE in the last 168 hours. No results for input(s): AMMONIA in the last 168 hours. Coagulation Profile: Recent Labs  Lab 02/10/19 2116  INR 1.1   Cardiac Enzymes: No results for input(s): CKTOTAL, CKMB, CKMBINDEX, TROPONINI in the last 168 hours. BNP (last 3 results) No results for input(s): PROBNP in  the last 8760 hours. HbA1C: No results for input(s): HGBA1C in the last 72 hours. CBG: No results for input(s): GLUCAP in the last 168 hours. Lipid Profile: No results for input(s): CHOL, HDL, LDLCALC, TRIG, CHOLHDL, LDLDIRECT in the last 72 hours. Thyroid Function Tests: No results for input(s): TSH, T4TOTAL, FREET4, T3FREE, THYROIDAB in the last 72 hours. Anemia Panel: No results for input(s): VITAMINB12, FOLATE, FERRITIN, TIBC, IRON, RETICCTPCT in the last 72 hours. Urine analysis:    Component Value Date/Time   COLORURINE STRAW (A) 02/11/2019 0200   APPEARANCEUR CLEAR 02/11/2019 0200   LABSPEC 1.005 02/11/2019 0200   PHURINE 6.0 02/11/2019 0200   GLUCOSEU NEGATIVE 02/11/2019 0200   HGBUR NEGATIVE 02/11/2019 0200   BILIRUBINUR NEGATIVE 02/11/2019 0200   BILIRUBINUR negative 09/10/2018 1355   BILIRUBINUR Small 10/18/2014 1619   KETONESUR NEGATIVE 02/11/2019 0200   PROTEINUR NEGATIVE 02/11/2019 0200   UROBILINOGEN 0.2 09/10/2018 1355   NITRITE NEGATIVE 02/11/2019 0200   LEUKOCYTESUR MODERATE (A) 02/11/2019 0200   Sepsis Labs: @LABRCNTIP (procalcitonin:4,lacticidven:4) ) Recent Results (from the past 240 hour(s))  SARS Coronavirus 2 [facility] order, Performed in [facility] hospital lab) Nasopharyngeal Nasopharyngeal Swab     Status: None   Collection Time: 02/10/19  11:23 PM   Specimen: Nasopharyngeal Swab  Result Value Ref Range Status   SARS Coronavirus 2 NEGATIVE NEGATIVE Final    Comment: (NOTE) If result is NEGATIVE SARS-CoV-2 target nucleic acids are NOT DETECTED. The SARS-CoV-2 RNA is generally detectable in upper and lower  respiratory specimens during the acute phase of infection. The lowest  concentration of SARS-CoV-2 viral copies this assay can detect is 250  copies / mL. A negative result does not preclude SARS-CoV-2 infection  and should not be used as the sole basis for treatment or other  patient management decisions.  A negative result may occur with  improper specimen collection / handling, submission of specimen other  than nasopharyngeal swab, presence of viral mutation(s) within the  areas targeted by this assay, and inadequate number of viral copies  (<250 copies / mL). A negative result must be combined with clinical  observations, patient history, and epidemiological information. If result is POSITIVE SARS-CoV-2 target nucleic acids are DETECTED. The SARS-CoV-2 RNA is generally detectable in upper and lower  respiratory specimens dur ing the acute phase of infection.  Positive  results are indicative of active infection with SARS-CoV-2.  Clinical  correlation with patient history and other diagnostic information is  necessary to determine patient infection status.  Positive results do  not rule out bacterial infection or co-infection with other viruses. If result is PRESUMPTIVE POSTIVE SARS-CoV-2 nucleic acids MAY BE PRESENT.   A presumptive positive result was obtained on the submitted specimen  and confirmed on repeat testing.  While 2019 novel coronavirus  (SARS-CoV-2) nucleic acids may be present in the submitted sample  additional confirmatory testing may be necessary for epidemiological  and / or clinical management purposes  to differentiate between  SARS-CoV-2 and other Sarbecovirus currently known to infect  humans.  If clinically indicated additional testing with an alternate test  methodology [phone]) is advised. The SARS-CoV-2 RNA is generally  detectable in upper and lower respiratory sp ecimens during the acute  phase of infection. The expected result is Negative. Fact Sheet for Patients:  StrictlyIdeas.no Fact Sheet for Healthcare Providers: BankingDealers.co.za This test is not yet approved or cleared by the [place] FDA and has been authorized for detection and/or diagnosis of SARS-CoV-2 by FDA under  an Emergency Use Authorization (EUA).  This EUA will remain in effect (meaning this test can be used) for the duration of the COVID-19 declaration under Section 564(b)(1) of the Act, 21 U.S.C. section 360bbb-3(b)(1), unless the authorization is terminated or revoked sooner. Performed at [facility], [place] [address] [place], [place] [address]      Radiological Exams on Admission: Dg Chest 2 View  Result Date: 02/10/2019 CLINICAL DATA:  Suspected sepsis EXAM: CHEST - 2 VIEW COMPARISON:  December 30, 2018 FINDINGS: The heart size and mediastinal contours are within normal limits. There is a right-sided MediPort catheter with the tip at the superior cavoatrial junction. Both lungs are clear. No acute osseous abnormality. IMPRESSION: No acute cardiopulmonary disease. Electronically Signed   By: Prudencio Pair M.D.   On: 02/10/2019 21:38    EKG: Independently reviewed. Sinus tachycardia (rate 119), PVC's, left anterior fascicular block.   Assessment/Plan   1. SIRS  - Presents with one day of fever and fatigue, and is found to be febrile and tachycardic in ED reasurringly normal lactate, clear CXR, negative COVID-19 test, no urinary sxs, no abd pain or N/V/D, no meningismus, no apparent cellulitis, no erythema or tenderness about the port in right chest that is functioning appropriately  - Blood and urine cultures collected  in ED, 1.5 liters of NS bolused, and broad spectrum antibiotics initiated  - Continue empiric antibiotics while following cultures and clinical course    2. Endometrial cancer  - Undergoing treatment with chemotherapy and radiation  - Continue gabapentin for associated neuropathy   3. Anemia  - Hgb is 8.9 on admission, stable and secondary to cancer treatment    PPE: Mask, face shield  DVT prophylaxis: Lovenox  Code Status: Full  Family Communication: Discussed with patient  Consults called: None  Admission status: Observation     Vianne Bulls, MD [facility] Pager [phone]  If 7PM-7AM, please contact night-coverage [email or web address] Password [email or web address]  02/11/2019, 4:07 AM

## 2019-02-11 NOTE — Progress Notes (Signed)
Patient presented yesterday evening and care was started before midnight but admission orders not place until this morning.  I have reviewed the H&P done by Dr. Mitzi Hansen and I am in current agreement with his assessment and plan.  Additional changes to the plan of care been made accordingly.  Patient is a 73 year old female with a past medical history significant for but not limited to endometrial cancer currently undergoing treatment with radiation and chemotherapy which is complicated by peripheral neuropathy and anemia who presented to the ED with a day of fevers, chills, fatigue and generalized weakness.  She reports that she woke up on the morning of 02/10/2019 with fevers chills and malaise and intermittently over the last month or so she has been having similar symptoms but had been worsening recently.  Denied any sick contacts.  In the ED she was found to be febrile and saturating on room air with tachycardia and a stable blood pressure.  Testing was negative.  Urinalysis showed some rare bacteria, moderate leukocytes, 11-20 WBCs per high-power field.  In the ED she was given normal saline 1.5 L boluses and started on broad-spectrum antibiotics with IV vancomycin IV cefepime.  Patient's fever is now improved and her T-max over the last 12 hours has been 100.  We will consult Dr. Alvy Bimler her primary oncologist for further evaluation recommendations.  She will be admitted for the following but not limited to:   SIRS  - Presents with one day of fever and fatigue, and is found to be febrile and tachycardic in ED reasurringly normal lactate, clear CXR, negative COVID-19 test, no urinary sxs, no abd pain or N/V/D, no meningismus, no apparent cellulitis, no erythema or tenderness about the port in right chest that is functioning appropriately  - Blood and urine cultures collected in ED, 1.5 liters of NS bolused, and broad spectrum antibiotics initiated  - Continue empiric antibiotics while following cultures  and clinical course    Endometrial cancer  - Undergoing treatment with chemotherapy and radiation  - Continue gabapentin for associated neuropathy  -We will consult her primary oncologist Dr. Alvy Bimler for further evaluation recommendations  Normocytic/macrocytic anemia - Hgb is 8.9 on admission, stable and secondary to cancer treatment  - Hemoglobin/hematocrit now is dropped to 7.1/21.6 likely in the setting of IV fluids - Continue to monitor for signs and symptoms of bleeding; currently no overt bleeding noted - Check anemia panel in a.m. - Repeat CBC in a.m.  Metabolic Acidosis -Patient's CO2 is now 21 and anion gap is 5 -Continue monitor trend continue with IV fluid hydration as above  Hyponatremia -Patient sodium on admission is 133 -Improved and now 137 -Continue to Monitor trend and repeat CMP in a.m.  Because the patient still requires treatment with IVF and broad-spectrum antibiotics and care will cross midnight will convert to inpatient status and follow patient's clinical response to intervention and repeat blood work in the a.m.

## 2019-02-11 NOTE — Progress Notes (Signed)
PHARMACY - PHYSICIAN COMMUNICATION CRITICAL VALUE ALERT - BLOOD CULTURE IDENTIFICATION (BCID)  Prosperity Andrasko Drew is an 73 y.o. female who presented to [facility] on 02/10/2019 with a chief complaint of fever, fatigue, weakness.  Assessment:  SIRS on empiric antibiotics  Name of physician (or Provider) Contacted: Dr. Alfredia Ferguson  Current antibiotics: cefepime, vancomycin, and metronidazole  Changes to prescribed antibiotics recommended: No changes at this time - continue broad spectrum antibiotics  Results for orders placed or performed during the hospital encounter of 02/10/19  Blood Culture ID Panel (Reflexed) (Collected: 02/10/2019  9:21 PM)  Result Value Ref Range   Enterococcus species NOT DETECTED NOT DETECTED   Listeria monocytogenes NOT DETECTED NOT DETECTED   Staphylococcus species DETECTED (A) NOT DETECTED   Staphylococcus aureus (BCID) NOT DETECTED NOT DETECTED   Methicillin resistance NOT DETECTED NOT DETECTED   Streptococcus species NOT DETECTED NOT DETECTED   Streptococcus agalactiae NOT DETECTED NOT DETECTED   Streptococcus pneumoniae NOT DETECTED NOT DETECTED   Streptococcus pyogenes NOT DETECTED NOT DETECTED   Acinetobacter baumannii NOT DETECTED NOT DETECTED   Enterobacteriaceae species NOT DETECTED NOT DETECTED   Enterobacter cloacae complex NOT DETECTED NOT DETECTED   Escherichia coli NOT DETECTED NOT DETECTED   Klebsiella oxytoca NOT DETECTED NOT DETECTED   Klebsiella pneumoniae NOT DETECTED NOT DETECTED   Proteus species NOT DETECTED NOT DETECTED   Serratia marcescens NOT DETECTED NOT DETECTED   Haemophilus influenzae NOT DETECTED NOT DETECTED   Neisseria meningitidis NOT DETECTED NOT DETECTED   Pseudomonas aeruginosa NOT DETECTED NOT DETECTED   Candida albicans NOT DETECTED NOT DETECTED   Candida glabrata NOT DETECTED NOT DETECTED   Candida krusei NOT DETECTED NOT DETECTED   Candida parapsilosis NOT DETECTED NOT DETECTED   Candida tropicalis NOT DETECTED NOT  DETECTED    Lenis Noon, PharmD 02/11/2019  5:24 PM

## 2019-02-11 NOTE — Telephone Encounter (Signed)
Husband called and left a message to call him he has not heard from anyone from the hospital since his wife went to ER.  Called back and instructed him to call [facility], phone number given and ask to speak with nurse. Told him the hospital is allowing visitors. He verbalized understanding.

## 2019-02-12 ENCOUNTER — Inpatient Hospital Stay (HOSPITAL_COMMUNITY): Payer: PPO

## 2019-02-12 DIAGNOSIS — R7881 Bacteremia: Secondary | ICD-10-CM

## 2019-02-12 DIAGNOSIS — A419 Sepsis, unspecified organism: Secondary | ICD-10-CM

## 2019-02-12 DIAGNOSIS — Z881 Allergy status to other antibiotic agents status: Secondary | ICD-10-CM

## 2019-02-12 DIAGNOSIS — R509 Fever, unspecified: Secondary | ICD-10-CM

## 2019-02-12 DIAGNOSIS — Z79899 Other long term (current) drug therapy: Secondary | ICD-10-CM

## 2019-02-12 DIAGNOSIS — K5900 Constipation, unspecified: Secondary | ICD-10-CM

## 2019-02-12 DIAGNOSIS — B952 Enterococcus as the cause of diseases classified elsewhere: Secondary | ICD-10-CM

## 2019-02-12 DIAGNOSIS — R6510 Systemic inflammatory response syndrome (SIRS) of non-infectious origin without acute organ dysfunction: Secondary | ICD-10-CM

## 2019-02-12 DIAGNOSIS — Z95828 Presence of other vascular implants and grafts: Secondary | ICD-10-CM

## 2019-02-12 DIAGNOSIS — R32 Unspecified urinary incontinence: Secondary | ICD-10-CM

## 2019-02-12 DIAGNOSIS — C541 Malignant neoplasm of endometrium: Secondary | ICD-10-CM

## 2019-02-12 DIAGNOSIS — N390 Urinary tract infection, site not specified: Secondary | ICD-10-CM

## 2019-02-12 DIAGNOSIS — B957 Other staphylococcus as the cause of diseases classified elsewhere: Secondary | ICD-10-CM | POA: Diagnosis present

## 2019-02-12 DIAGNOSIS — T451X5A Adverse effect of antineoplastic and immunosuppressive drugs, initial encounter: Secondary | ICD-10-CM

## 2019-02-12 LAB — COMPREHENSIVE METABOLIC PANEL
ALT: 19 U/L (ref 0–44)
AST: 22 U/L (ref 15–41)
Albumin: 3 g/dL — ABNORMAL LOW (ref 3.5–5.0)
Alkaline Phosphatase: 79 U/L (ref 38–126)
Anion gap: 9 (ref 5–15)
BUN: 11 mg/dL (ref 8–23)
CO2: 20 mmol/L — ABNORMAL LOW (ref 22–32)
Calcium: 7.6 mg/dL — ABNORMAL LOW (ref 8.9–10.3)
Chloride: 103 mmol/L (ref 98–111)
Creatinine, Ser: 0.82 mg/dL (ref 0.44–1.00)
GFR calc Af Amer: >60 mL/min (ref >60)
GFR calc non Af Amer: >60 mL/min (ref >60)
Glucose, Bld: 124 mg/dL — ABNORMAL HIGH (ref 70–99)
Potassium: 3.5 mmol/L (ref 3.5–5.1)
Sodium: 132 mmol/L — ABNORMAL LOW (ref 135–145)
Total Bilirubin: 0.8 mg/dL (ref 0.3–1.2)
Total Protein: 5.7 g/dL — ABNORMAL LOW (ref 6.5–8.1)

## 2019-02-12 LAB — ECHOCARDIOGRAM COMPLETE
Height: 64 in
Weight: 2720 oz

## 2019-02-12 LAB — CBC WITH DIFFERENTIAL/PLATELET
Abs Immature Granulocytes: 0.08 10*3/uL — ABNORMAL HIGH (ref 0.00–0.07)
Basophils Absolute: 0 10*3/uL (ref 0.0–0.1)
Basophils Relative: 0 %
Eosinophils Absolute: 0 10*3/uL (ref 0.0–0.5)
Eosinophils Relative: 0 %
HCT: 22.4 % — ABNORMAL LOW (ref 36.0–46.0)
Hemoglobin: 7.5 g/dL — ABNORMAL LOW (ref 12.0–15.0)
Immature Granulocytes: 1 %
Lymphocytes Relative: 13 %
Lymphs Abs: 1.2 10*3/uL (ref 0.7–4.0)
MCH: 34.4 pg — ABNORMAL HIGH (ref 26.0–34.0)
MCHC: 33.5 g/dL (ref 30.0–36.0)
MCV: 102.8 fL — ABNORMAL HIGH (ref 80.0–100.0)
Monocytes Absolute: 1.1 10*3/uL — ABNORMAL HIGH (ref 0.1–1.0)
Monocytes Relative: 12 %
Neutro Abs: 6.7 10*3/uL (ref 1.7–7.7)
Neutrophils Relative %: 74 %
Platelets: 210 10*3/uL (ref 150–400)
RBC: 2.18 MIL/uL — ABNORMAL LOW (ref 3.87–5.11)
RDW: 16.6 % — ABNORMAL HIGH (ref 11.5–15.5)
WBC: 9.1 10*3/uL (ref 4.0–10.5)
nRBC: 0 % (ref 0.0–0.2)

## 2019-02-12 LAB — PHOSPHORUS: Phosphorus: 2.9 mg/dL (ref 2.5–4.6)

## 2019-02-12 LAB — URINE CULTURE: Culture: >=100000 — AB

## 2019-02-12 LAB — MAGNESIUM: Magnesium: 1.6 mg/dL — ABNORMAL LOW (ref 1.7–2.4)

## 2019-02-12 MED ORDER — SODIUM CHLORIDE 0.9 % IV BOLUS
500.0000 mL | Freq: Once | INTRAVENOUS | Status: AC
  Administered 2019-02-12: 500 mL via INTRAVENOUS

## 2019-02-12 MED ORDER — SODIUM CHLORIDE 0.9 % IV SOLN
INTRAVENOUS | Status: DC
  Administered 2019-02-12: 14:00:00 via INTRAVENOUS

## 2019-02-12 MED ORDER — MAGNESIUM SULFATE 2 GM/50ML IV SOLN
2.0000 g | Freq: Once | INTRAVENOUS | Status: AC
  Administered 2019-02-12: 2 g via INTRAVENOUS
  Filled 2019-02-12: qty 50

## 2019-02-12 NOTE — Progress Notes (Signed)
PROGRESS NOTE    Zoe Kim  [id number] DOB: Oct 30, 1945 DOA: 02/10/2019 PCP: [name], MD   Brief Narrative:  Patient is a 73 year old female with a past medical history significant for but not limited to endometrial cancer currently undergoing treatment with radiation and chemotherapy which is complicated by peripheral neuropathy and anemia who presented to the ED with a day of fevers, chills, fatigue and generalized weakness.  She reports that she woke up on the morning of 02/10/2019 with fevers chills and malaise and intermittently over the last month or so she has been having similar symptoms but had been worsening recently.  Denied any sick contacts.  In the ED she was found to be febrile and saturating on room air with tachycardia and a stable blood pressure.  Testing was negative.  Urinalysis showed some rare bacteria, moderate leukocytes, 11-20 WBCs per high-power field.  In the ED she was given normal saline 1.5 L boluses and started on broad-spectrum antibiotics with IV vancomycin IV cefepime.  Further work-up reveals that she had a 1 out of 4 staph species bacteremia and urine is growing out greater than 100,000 colony-forming units of Enterococcus faecalis.  Infectious disease was consulted for further evaluation recommendations and an echocardiogram has been ordered for her bacteremia.  Dr. Megan Salon was to adjust her antibiotic regimen currently and further work-up has been pursued and blood cultures were repeated.  Assessment & Plan:   Principal Problem:   SIRS (systemic inflammatory response syndrome) (HCC) Active Problems:   Endometrial cancer (HCC)   Peripheral neuropathy due to chemotherapy (HCC)   Anemia due to antineoplastic chemotherapy  Sepsis 2/2 a Gram + Cocci Bacteremia and Enterococcus UTI, ruled in and poA -Presents with one day of fever and fatigue, and is found to be febrile and tachycardic in ED reasurringly normal lactate, clear CXR,negative  COVID-19 test, nourinary sxs, noabd pain or N/V/D,nomeningismus, noapparent cellulitis,no erythema or tenderness about theport in right chest that is functioning appropriately -Blood and urine cultures collected in ED and 1/4 grew out MSSA Staph Species and Urine is growing out >100,000 CFU of Enterococcus which was pan-sensitive, -She was given 1.5 liters of NS as boluses, and broad spectrum antibiotics initiatedwith IV Van, Cefepime and Flagyl; Will defer Abx Management to ID as I have consulted Dr. Megan Salon - IVF now resumed at 39 mL/hr -Continue empiric antibiotics while following cultures and clinical course -Have ordered an echocardiogram because of her suspected bacteremia -Repeated blood cultures yesterday and they are still pending -Continue to monitor temperature curve and cultures; patient is still spiking temperatures and had a temperature of 101.6 this morning and sepsis physiology is improving -We will obtain PT and OT to evaluate and treat  Enterococcus urinary tract infection, present on admission -As above -Analysis grew out straw-colored urine, moderate leukocytes, rare bacteria, 11-20 WBCs, urine culture grew out greater than 100,000 colony-forming units of enterococcus faecalis which is pansensitive -ID was consulted and will defer antibiotic management to them as we have initiated broad-spectrum antibiotics on admission and this was continued.  Endometrial cancer -Undergoing treatment with chemotherapy and radiation -Continue gabapentin for associated neuropathy -We will consult her primary oncologist Dr. Alvy Bimler for further evaluation recommendations  Normocytic/Macrocytic Anemia -Hgb is 8.9 on admission, stable and secondary to cancer treatment  - Hemoglobin/hematocrit now is dropped to 7.1/21.6 likely in the setting of IV fluids and repeat yesterday was 7.4/22.9 and today it is 7.5/22.4 - Continue to monitor for signs and symptoms of bleeding;  currently no overt bleeding noted - Check anemia panel in a.m. - Repeat CBC in a.m.  Metabolic Acidosis -Patient's CO2 is now 20 and anion gap is now 9 -Continue monitor trend continue with IV fluid hydration as above -Repeat CMP in a.m.  Hyponatremia -Patient sodium on admission is 133 -Improved to 137 is now trended back down to 132 -Will now start on IVF with NS at 75 mL/hr -Continue to Monitor trend and repeat CMP in a.m.  Hypomagnesemia -Patient's Mag Level this AM was 1.6 -Replete with IV mag sulfate 2 g -Continue to monitor and replete as necessary -Repeat magnesium level in a.m.  Hyperglycemia -Likely reactive in the setting of infection however will need to rule out diabetes mellitus and obtain a hemoglobin A1c in the a.m. -Blood sugars on daily BMPs/CMP's have been ranging from 99-127 -Continue to monitor blood sugars carefully and if necessary will need to place on a sensitive NovoLog/scale insulin AC  DVT prophylaxis: Enoxaparin 40 mg subcu every 24h Code Status: FULL CODE Family Communication:  No family present at bedside  Disposition Plan: Remain inpatient for continued work-up and treatment and continuing antibiotics  Consultants:   Medical Oncology  Infectious Diseases   Procedures:  ECHOCARDIOGRAM done and pending read   Antimicrobials:  Anti-infectives (From admission, onward)   Start     Dose/Rate Route Frequency Ordered Stop   02/11/19 2200  vancomycin (VANCOCIN) 1,250 mg in sodium chloride 0.9 % 250 mL IVPB     1,250 mg 166.7 mL/hr over 90 Minutes Intravenous Every 24 hours 02/11/19 0539     02/11/19 1800  ceFEPIme (MAXIPIME) 2 g in sodium chloride 0.9 % 100 mL IVPB  Status:  Discontinued     2 g 200 mL/hr over 30 Minutes Intravenous Every 12 hours 02/11/19 0454 02/11/19 1212   02/11/19 1400  ceFEPIme (MAXIPIME) 2 g in sodium chloride 0.9 % 100 mL IVPB     2 g 200 mL/hr over 30 Minutes Intravenous Every 8 hours 02/11/19 1212     02/11/19  0500  metroNIDAZOLE (FLAGYL) IVPB 500 mg     500 mg 100 mL/hr over 60 Minutes Intravenous Every 8 hours 02/11/19 0442     02/11/19 0500  vancomycin (VANCOCIN) 1,250 mg in sodium chloride 0.9 % 250 mL IVPB     1,250 mg 166.7 mL/hr over 90 Minutes Intravenous  Once 02/11/19 0454 02/11/19 0759   02/11/19 0400  ceFEPIme (MAXIPIME) 2 g in sodium chloride 0.9 % 100 mL IVPB     2 g 200 mL/hr over 30 Minutes Intravenous  Once 02/11/19 0354 02/11/19 0443     Subjective: Seen and examined at bedside and she states that she is doing okay.  Still felt lethargic and weak.  No chest pain, lightheadedness or dizziness.  No nausea or vomiting.  Surprised to have a urinary tract infection questionable bacteremia.  No other concerns or complaints at this time.  Objective: Vitals:   02/12/19 0528 02/12/19 0540 02/12/19 0605 02/12/19 0650  BP: 118/72     Pulse: (!) 115 (!) 115 (!) 108 (!) 102  Resp: 18     Temp: (!) 101.6 F (38.7 C) (!) 101.6 F (38.7 C)  99.5 F (37.5 C)  TempSrc: Oral Oral  Oral  SpO2: 96%     Weight:      Height:        Intake/Output Summary (Last 24 hours) at 02/12/2019 0827 Last data filed at 02/12/2019 0606 Gross per 24 hour  Intake 636.14 ml  Output 1000 ml  Net -363.86 ml   Filed Weights   02/10/19 2112  Weight: 77.1 kg   Examination: Physical Exam:  Constitutional: WN/WD overweight Caucasian female in NAD and appears calm but fatigued Eyes:  Lids and conjunctivae normal, sclerae anicteric  ENMT: External Ears, Nose appear normal. Grossly normal hearing.  Neck: Appears normal, supple, no cervical masses, normal ROM, no appreciable thyromegaly; no JVD Respiratory: Diminished to auscultation bilaterally, no wheezing, rales, rhonchi or crackles. Normal respiratory effort and patient is not tachypenic. No accessory muscle use.  Cardiovascular: Tachycardic Rate but regular rhythm, no murmurs / rubs / gallops. S1 and S2 auscultated. Trace extremity edema.  Abdomen:  Soft, non-tender, Distended slightly 2/2 body habitus. No masses palpated. No appreciable hepatosplenomegaly. Bowel sounds positive x4.  GU: Deferred. Musculoskeletal: No clubbing / cyanosis of digits/nails. No joint deformity upper and lower extremities.  Skin: No rashes, lesions, ulcers on a limited skin evaluation. No induration; Warm and dry.  Neurologic: CN 2-12 grossly intact with no focal deficits. Romberg sign and cerebellar reflexes not assessed.  Psychiatric: Normal judgment and insight. Alert and oriented x 3. Slightly depressed mood and mildly flat affect.   Data Reviewed: I have personally reviewed following labs and imaging studies  CBC: Recent Labs  Lab 02/10/19 2116 02/11/19 0923 02/11/19 1413 02/12/19 0532  WBC 7.3 6.1 8.0 9.1  NEUTROABS 5.4 4.5 6.7 6.7  HGB 8.9* 7.1* 7.4* 7.5*  HCT 26.7* 21.6* 22.9* 22.4*  MCV 99.6 102.9* 102.7* 102.8*  PLT 295 206 218 [id number]   Basic Metabolic Panel: Recent Labs  Lab 02/10/19 2116 02/11/19 0923 02/11/19 1413 02/12/19 0532  NA 133* 137 134* 132*  K 4.0 3.6 3.5 3.5  CL 98 111 103 103  CO2 23 21* 21* 20*  GLUCOSE 116* 99 127* 124*  BUN 13 11 11 11   CREATININE 0.91 0.77 0.81 0.82  CALCIUM 9.4 7.6* 8.2* 7.6*  MG  --  1.7  --  1.6*  PHOS  --  4.0  --  2.9   GFR: Estimated Creatinine Clearance: 60.5 mL/min (by C-G formula based on SCr of 0.82 mg/dL). Liver Function Tests: Recent Labs  Lab 02/10/19 2116 02/11/19 0923 02/12/19 0532  AST 33 21 22  ALT 27 19 19   ALKPHOS 121 87 79  BILITOT 0.8 0.6 0.8  PROT 7.3 5.6* 5.7*  ALBUMIN 4.3 3.1* 3.0*   No results for input(s): LIPASE, AMYLASE in the last 168 hours. No results for input(s): AMMONIA in the last 168 hours. Coagulation Profile: Recent Labs  Lab 02/10/19 2116  INR 1.1   Cardiac Enzymes: No results for input(s): CKTOTAL, CKMB, CKMBINDEX, TROPONINI in the last 168 hours. BNP (last 3 results) No results for input(s): PROBNP in the last 8760 hours. HbA1C: No  results for input(s): HGBA1C in the last 72 hours. CBG: No results for input(s): GLUCAP in the last 168 hours. Lipid Profile: No results for input(s): CHOL, HDL, LDLCALC, TRIG, CHOLHDL, LDLDIRECT in the last 72 hours. Thyroid Function Tests: No results for input(s): TSH, T4TOTAL, FREET4, T3FREE, THYROIDAB in the last 72 hours. Anemia Panel: No results for input(s): VITAMINB12, FOLATE, FERRITIN, TIBC, IRON, RETICCTPCT in the last 72 hours. Sepsis Labs: Recent Labs  Lab 02/10/19 2116  LATICACIDVEN 1.2    Recent Results (from the past 240 hour(s))  Culture, blood (Routine x 2)     Status: None (Preliminary result)   Collection Time: 02/10/19  9:21 PM   Specimen: BLOOD  Result Value Ref Range Status   Specimen Description   Final    BLOOD BLOOD RIGHT HAND Performed at [facility] [address], [place], [place] [address]    Special Requests   Final    BOTTLES DRAWN AEROBIC AND ANAEROBIC Blood Culture adequate volume Performed at [facility] [address], [place], [place] [address]    Culture  Setup Time   Final    GRAM POSITIVE COCCI IN BOTH AEROBIC AND ANAEROBIC BOTTLES CRITICAL RESULT CALLED TO, READ BACK BY AND VERIFIED WITH: Sheffield Slider PharmD 17:00 02/11/19 ([email or web address]) Performed at [facility], [place] [address] [place], [place] [address]    Culture GRAM POSITIVE COCCI  Final   Report Status PENDING  Incomplete  Blood Culture ID Panel (Reflexed)     Status: Abnormal   Collection Time: 02/10/19  9:21 PM  Result Value Ref Range Status   Enterococcus species NOT DETECTED NOT DETECTED Final   Listeria monocytogenes NOT DETECTED NOT DETECTED Final   Staphylococcus species DETECTED (A) NOT DETECTED Final    Comment: Methicillin (oxacillin) susceptible coagulase negative staphylococcus. Possible blood culture contaminant (unless isolated from more than one blood culture draw or clinical case suggests pathogenicity). No antibiotic  treatment is indicated for blood  culture contaminants. CRITICAL RESULT CALLED TO, READ BACK BY AND VERIFIED WITH: Sheffield Slider PharmD 17:00 02/11/19 ([email or web address])    Staphylococcus aureus (BCID) NOT DETECTED NOT DETECTED Final   Methicillin resistance NOT DETECTED NOT DETECTED Final   Streptococcus species NOT DETECTED NOT DETECTED Final   Streptococcus agalactiae NOT DETECTED NOT DETECTED Final   Streptococcus pneumoniae NOT DETECTED NOT DETECTED Final   Streptococcus pyogenes NOT DETECTED NOT DETECTED Final   Acinetobacter baumannii NOT DETECTED NOT DETECTED Final   Enterobacteriaceae species NOT DETECTED NOT DETECTED Final   Enterobacter cloacae complex NOT DETECTED NOT DETECTED Final   Escherichia coli NOT DETECTED NOT DETECTED Final   Klebsiella oxytoca NOT DETECTED NOT DETECTED Final   Klebsiella pneumoniae NOT DETECTED NOT DETECTED Final   Proteus species NOT DETECTED NOT DETECTED Final   Serratia marcescens NOT DETECTED NOT DETECTED Final   Haemophilus influenzae NOT DETECTED NOT DETECTED Final   Neisseria meningitidis NOT DETECTED NOT DETECTED Final   Pseudomonas aeruginosa NOT DETECTED NOT DETECTED Final   Candida albicans NOT DETECTED NOT DETECTED Final   Candida glabrata NOT DETECTED NOT DETECTED Final   Candida krusei NOT DETECTED NOT DETECTED Final   Candida parapsilosis NOT DETECTED NOT DETECTED Final   Candida tropicalis NOT DETECTED NOT DETECTED Final    Comment: Performed at [facility], [address], [place], [place] [address]  SARS Coronavirus 2 [facility] order, Performed in [facility] hospital lab) Nasopharyngeal Nasopharyngeal Swab     Status: None   Collection Time: 02/10/19 11:23 PM   Specimen: Nasopharyngeal Swab  Result Value Ref Range Status   SARS Coronavirus 2 NEGATIVE NEGATIVE Final    Comment: (NOTE) If result is NEGATIVE SARS-CoV-2 target nucleic acids are NOT DETECTED. The SARS-CoV-2 RNA is generally detectable in upper and lower  respiratory  specimens during the acute phase of infection. The lowest  concentration of SARS-CoV-2 viral copies this assay can detect is 250  copies / mL. A negative result does not preclude SARS-CoV-2 infection  and should not be used as the sole basis for treatment or other  patient management decisions.  A negative result may occur with  improper specimen collection / handling, submission of  specimen other  than nasopharyngeal swab, presence of viral mutation(s) within the  areas targeted by this assay, and inadequate number of viral copies  (<250 copies / mL). A negative result must be combined with clinical  observations, patient history, and epidemiological information. If result is POSITIVE SARS-CoV-2 target nucleic acids are DETECTED. The SARS-CoV-2 RNA is generally detectable in upper and lower  respiratory specimens dur ing the acute phase of infection.  Positive  results are indicative of active infection with SARS-CoV-2.  Clinical  correlation with patient history and other diagnostic information is  necessary to determine patient infection status.  Positive results do  not rule out bacterial infection or co-infection with other viruses. If result is PRESUMPTIVE POSTIVE SARS-CoV-2 nucleic acids MAY BE PRESENT.   A presumptive positive result was obtained on the submitted specimen  and confirmed on repeat testing.  While 2019 novel coronavirus  (SARS-CoV-2) nucleic acids may be present in the submitted sample  additional confirmatory testing may be necessary for epidemiological  and / or clinical management purposes  to differentiate between  SARS-CoV-2 and other Sarbecovirus currently known to infect humans.  If clinically indicated additional testing with an alternate test  methodology [phone]) is advised. The SARS-CoV-2 RNA is generally  detectable in upper and lower respiratory sp ecimens during the acute  phase of infection. The expected result is Negative. Fact Sheet for  Patients:  StrictlyIdeas.no Fact Sheet for Healthcare Providers: BankingDealers.co.za This test is not yet approved or cleared by the [place] FDA and has been authorized for detection and/or diagnosis of SARS-CoV-2 by FDA under an Emergency Use Authorization (EUA).  This EUA will remain in effect (meaning this test can be used) for the duration of the COVID-19 declaration under Section 564(b)(1) of the Act, 21 U.S.C. section 360bbb-3(b)(1), unless the authorization is terminated or revoked sooner. Performed at [facility], [place] [address] [place], [place] [address]   Urine culture     Status: Abnormal (Preliminary result)   Collection Time: 02/11/19  2:00 AM   Specimen: Urine, Random  Result Value Ref Range Status   Specimen Description   Final    URINE, RANDOM Performed at [facility] [address] [place], [place] [address]    Special Requests   Final    NONE Performed at [facility] [place] [address] [place], [place] [address]    Culture (A)  Final    >=100,000 COLONIES/mL GRAM POSITIVE COCCI IDENTIFICATION AND SUSCEPTIBILITIES TO FOLLOW Performed at [facility], [place] [address] [place], [place] [address]    Report Status PENDING  Incomplete  Culture, sputum-assessment     Status: None   Collection Time: 02/11/19  9:41 AM   Specimen: Sputum  Result Value Ref Range Status   Specimen Description SPUTUM  Final   Special Requests NONE  Final   Sputum evaluation   Final    Sputum specimen not acceptable for testing.  Please recollect.   NOTIFIED Pauline Good [id number] Performed at [facility], [place] [address] [place],  [address]    Report Status 02/11/2019 FINAL  Final    Radiology Studies: Dg Chest 2 View  Result Date: 02/10/2019 CLINICAL DATA:  Suspected sepsis EXAM: CHEST - 2 VIEW COMPARISON:  December 30, 2018 FINDINGS: The  heart size and mediastinal contours are within normal limits. There is a right-sided MediPort catheter with the tip at the superior cavoatrial junction. Both lungs are clear. No  acute osseous abnormality. IMPRESSION: No acute cardiopulmonary disease. Electronically Signed   By: Prudencio Pair M.D.   On: 02/10/2019 21:38   Scheduled Meds: . enoxaparin (LOVENOX) injection  40 mg Subcutaneous Q24H  . gabapentin  300 mg Oral BID  . sodium chloride flush  3 mL Intravenous Q12H   Continuous Infusions: . ceFEPime (MAXIPIME) IV 2 g (02/12/19 0716)  . magnesium sulfate bolus IVPB    . metronidazole 500 mg (02/12/19 0502)  . vancomycin 1,250 mg (02/11/19 2136)    LOS: 1 day   Kerney Elbe, DO [facility] PAGER is on [place]  If 7PM-7AM, please contact night-coverage [email or web address] Password [facility] 02/12/2019, 8:27 AM

## 2019-02-12 NOTE — Progress Notes (Signed)
  Echocardiogram 2D Echocardiogram has been performed.  Zoe Kim 02/12/2019, 9:07 AM

## 2019-02-12 NOTE — Consult Note (Addendum)
[facility] for Infectious Disease    Date of Admission:  02/10/2019           Day 2 vancomycin        Day 2 cefepime        Day 2 metronidazole       Reason for Consult: Fever and positive urine and blood cultures    Referring Provider: Dr. Kerney Elbe  Assessment: The cause of her acute febrile illness remains uncertain.  The positive blood culture probably represents an insignificant contaminant.  She was hospitalized in late she also had coagulase-negative staph in 1 was methicillin-resistant, not methicillin susceptible like her  current isolate.  She does not have acute CVA tenderness or dysuria to suggest that she has a symptomatic urinary tract infection.  I will continue vancomycin pending final culture results but favor stopping cefepime and metronidazole now.  Addendum: I just received a call from the microbiology lab informing me that her second set of admission blood cultures positive cocci.  Obviously this makes true bacteremia much more likely and raises concern about infection of her Port-A-Cath.  Plan: 1. Continue vancomycin pending final admission and repeat blood cultures 2. Await results of echocardiogram 3. Discontinue cefepime and metronidazole  Principal Problem:   Fever Active Problems:   SIRS (systemic inflammatory response syndrome) (HCC)   Positive blood cultures   Endometrial cancer (HCC)   Peripheral neuropathy due to chemotherapy (HCC)   Anemia due to antineoplastic chemotherapy   Scheduled Meds: . enoxaparin (LOVENOX) injection  40 mg Subcutaneous Q24H  . gabapentin  300 mg Oral BID  . sodium chloride flush  3 mL Intravenous Q12H   Continuous Infusions: . sodium chloride    . vancomycin 1,250 mg (02/11/19 2136)   PRN Meds:.acetaminophen **OR** acetaminophen, guaiFENesin-dextromethorphan, Melatonin, morphine injection, ondansetron **OR** ondansetron (ZOFRAN) IV, polyethylene glycol, traMADol  HPI: Zoe Kim is a  73 y.o. female with endometrial cancer who has been undergoing chemotherapy and radiation therapy.  Chemotherapy has been on hold for the past several weeks because of anemia.  She had acute onset of fever to 103 degrees and severe fatigue leading to readmission 02/10/2019.  She was started on broad empiric antibiotic therapy.  She is not neutropenic.  Her chest x-ray showed no active disease.  Testing for COVID-19 was negative.  UA revealed pyuria but she has no urinary symptoms to suggest a symptomatic UTI.  Urine culture is growing high.  1 of 2 blood cultures has grown methicillin susceptible coagulase-negative staph. She has had some nausea but no vomiting diarrhea.  She is constipated. She is still very weak and had fever to 101.6 degrees last night but her temperature seems to be trending downward.   Review of Systems: Review of Systems  Constitutional: Positive for chills, fever and malaise/fatigue. Negative for diaphoresis and weight loss.  HENT: Negative for congestion and sore throat.   Respiratory: Positive for cough. Negative for sputum production and shortness of breath.   Cardiovascular: Negative for chest pain.  Gastrointestinal: Positive for constipation and nausea. Negative for abdominal pain, diarrhea and vomiting.  Genitourinary: Negative for dysuria and urgency.       She has chronic urinary incontinence.  Musculoskeletal: Negative for back pain, joint pain and myalgias.  Skin: Negative for rash.  Neurological: Negative for headaches.    Past Medical History:  Diagnosis Date  . Anxiety   . Arthritis   . Depression   .  Endometrial ca ([place])   . Family history of breast cancer   . Family history of lung cancer   . Heart murmur    "years ago"  . Insomnia   . Pneumonia    "long time ago"  . Primary localized osteoarthrosis of the knee, right   . Urinary urgency     Social History   Tobacco Use  . Smoking status: Never Smoker  . Smokeless tobacco: Never Used  .  Tobacco comment: smoked 2 weeks in college  Substance Use Topics  . Alcohol use: No  . Drug use: No    Family History  Problem Relation Age of Onset  . Heart disease Mother   . COPD Mother   . Cancer Mother        uterine (possibly, pt unsure)  . Breast cancer Mother        dx in 91s  . Heart disease Father   . COPD Brother   . Heart disease Brother   . Cancer Brother 29       lung ca, smoker  . Cancer Maternal Grandmother        breast dx early 20s  . Stroke Maternal Grandfather    Allergies  Allergen Reactions  . Sulfa Antibiotics     UNSPECIFIED REACTION     OBJECTIVE: Blood pressure (!) 119/58, pulse (!) 106, temperature 98 F (36.7 C), temperature source Oral, resp. rate 18, height 5\' 4"  (1.626 m), weight 77.1 kg, SpO2 96 %.  Physical Exam Constitutional:      Comments: She is in no distress.  She is in good spirits.  Her husband is at the bedside  HENT:     Mouth/Throat:     Pharynx: No oropharyngeal exudate.  Eyes:     Conjunctiva/sclera: Conjunctivae normal.  Neck:     Musculoskeletal: Neck supple.  Cardiovascular:     Rate and Rhythm: Normal rate and regular rhythm.     Heart sounds: No murmur.  Pulmonary:     Effort: Pulmonary effort is normal.     Breath sounds: Normal breath sounds.  Chest:    Abdominal:     Palpations: Abdomen is soft.     Tenderness: There is no abdominal tenderness.  Musculoskeletal:        General: No swelling or tenderness.  Skin:    Findings: No rash.  Psychiatric:        Mood and Affect: Mood normal.     Lab Results Lab Results  Component Value Date   WBC 9.1 02/12/2019   HGB 7.5 (L) 02/12/2019   HCT 22.4 (L) 02/12/2019   MCV 102.8 (H) 02/12/2019   PLT 210 02/12/2019    Lab Results  Component Value Date   CREATININE 0.82 02/12/2019   BUN 11 02/12/2019   NA 132 (L) 02/12/2019   K 3.5 02/12/2019   CL 103 02/12/2019   CO2 20 (L) 02/12/2019    Lab Results  Component Value Date   ALT 19 02/12/2019    AST 22 02/12/2019   ALKPHOS 79 02/12/2019   BILITOT 0.8 02/12/2019     Microbiology: Recent Results (from the past 240 hour(s))  Culture, blood (Routine x 2)     Status: None (Preliminary result)   Collection Time: 02/10/19  9:16 PM   Specimen: BLOOD  Result Value Ref Range Status   Specimen Description   Final    BLOOD RIGHT WRIST Performed at [facility], [address] [place], [place]  [address]    Special Requests   Final    BOTTLES DRAWN AEROBIC AND ANAEROBIC Blood Culture adequate volume Performed at [facility] [address] [place], [place] [address]    Culture  Setup Time   Final    GRAM POSITIVE COCCI AEROBIC BOTTLE ONLY CRITICAL RESULT CALLED TO, READ BACK BY AND VERIFIED WITH: DR Ralphie Lovelady [id number] AT 1157 AM BY CM Performed at [facility], [place] [address], [place], [place] [address]    Culture PENDING  Incomplete   Report Status PENDING  Incomplete  Culture, blood (Routine x 2)     Status: None (Preliminary result)   Collection Time: 02/10/19  9:21 PM   Specimen: BLOOD  Result Value Ref Range Status   Specimen Description   Final    BLOOD BLOOD RIGHT HAND Performed at [facility] [address], [place], [place] [address]    Special Requests   Final    BOTTLES DRAWN AEROBIC AND ANAEROBIC Blood Culture adequate volume Performed at [facility] [address], [place], [place] [address]    Culture  Setup Time   Final    GRAM POSITIVE COCCI IN BOTH AEROBIC AND ANAEROBIC BOTTLES CRITICAL RESULT CALLED TO, READ BACK BY AND VERIFIED WITH: Sheffield Slider PharmD 17:00 02/11/19 ([email or web address])    Culture   Final    GRAM POSITIVE COCCI CULTURE REINCUBATED FOR BETTER GROWTH Performed at [facility], [place] [address] [place], [place] [address]    Report Status PENDING  Incomplete  Blood Culture ID Panel (Reflexed)     Status: Abnormal   Collection Time: 02/10/19  9:21 PM  Result  Value Ref Range Status   Enterococcus species NOT DETECTED NOT DETECTED Final   Listeria monocytogenes NOT DETECTED NOT DETECTED Final   Staphylococcus species DETECTED (A) NOT DETECTED Final    Comment: Methicillin (oxacillin) susceptible coagulase negative staphylococcus. Possible blood culture contaminant (unless isolated from more than one blood culture draw or clinical case suggests pathogenicity). No antibiotic treatment is indicated for blood  culture contaminants. CRITICAL RESULT CALLED TO, READ BACK BY AND VERIFIED WITH: Sheffield Slider PharmD 17:00 02/11/19 ([email or web address])    Staphylococcus aureus (BCID) NOT DETECTED NOT DETECTED Final   Methicillin resistance NOT DETECTED NOT DETECTED Final   Streptococcus species NOT DETECTED NOT DETECTED Final   Streptococcus agalactiae NOT DETECTED NOT DETECTED Final   Streptococcus pneumoniae NOT DETECTED NOT DETECTED Final   Streptococcus pyogenes NOT DETECTED NOT DETECTED Final   Acinetobacter baumannii NOT DETECTED NOT DETECTED Final   Enterobacteriaceae species NOT DETECTED NOT DETECTED Final   Enterobacter cloacae complex NOT DETECTED NOT DETECTED Final   Escherichia coli NOT DETECTED NOT DETECTED Final   Klebsiella oxytoca NOT DETECTED NOT DETECTED Final   Klebsiella pneumoniae NOT DETECTED NOT DETECTED Final   Proteus species NOT DETECTED NOT DETECTED Final   Serratia marcescens NOT DETECTED NOT DETECTED Final   Haemophilus influenzae NOT DETECTED NOT DETECTED Final   Neisseria meningitidis NOT DETECTED NOT DETECTED Final   Pseudomonas aeruginosa NOT DETECTED NOT DETECTED Final   Candida albicans NOT DETECTED NOT DETECTED Final   Candida glabrata NOT DETECTED NOT DETECTED Final   Candida krusei NOT DETECTED NOT DETECTED Final   Candida parapsilosis NOT DETECTED NOT DETECTED Final   Candida tropicalis NOT DETECTED NOT DETECTED Final    Comment: Performed at [facility], [address] [place], [place] [address]  SARS Coronavirus 2  [facility] order,  Performed in [facility] hospital lab) Nasopharyngeal Nasopharyngeal Swab     Status: None   Collection Time: 02/10/19 11:23 PM   Specimen: Nasopharyngeal Swab  Result Value Ref Range Status   SARS Coronavirus 2 NEGATIVE NEGATIVE Final    Comment: (NOTE) If result is NEGATIVE SARS-CoV-2 target nucleic acids are NOT DETECTED. The SARS-CoV-2 RNA is generally detectable in upper and lower  respiratory specimens during the acute phase of infection. The lowest  concentration of SARS-CoV-2 viral copies this assay can detect is 250  copies / mL. A negative result does not preclude SARS-CoV-2 infection  and should not be used as the sole basis for treatment or other  patient management decisions.  A negative result may occur with  improper specimen collection / handling, submission of specimen other  than nasopharyngeal swab, presence of viral mutation(s) within the  areas targeted by this assay, and inadequate number of viral copies  (<250 copies / mL). A negative result must be combined with clinical  observations, patient history, and epidemiological information. If result is POSITIVE SARS-CoV-2 target nucleic acids are DETECTED. The SARS-CoV-2 RNA is generally detectable in upper and lower  respiratory specimens dur ing the acute phase of infection.  Positive  results are indicative of active infection with SARS-CoV-2.  Clinical  correlation with patient history and other diagnostic information is  necessary to determine patient infection status.  Positive results do  not rule out bacterial infection or co-infection with other viruses. If result is PRESUMPTIVE POSTIVE SARS-CoV-2 nucleic acids MAY BE PRESENT.   A presumptive positive result was obtained on the submitted specimen  and confirmed on repeat testing.  While 2019 novel coronavirus  (SARS-CoV-2) nucleic acids may be present in the submitted sample  additional confirmatory testing may be necessary for  epidemiological  and / or clinical management purposes  to differentiate between  SARS-CoV-2 and other Sarbecovirus currently known to infect humans.  If clinically indicated additional testing with an alternate test  methodology [phone]) is advised. The SARS-CoV-2 RNA is generally  detectable in upper and lower respiratory sp ecimens during the acute  phase of infection. The expected result is Negative. Fact Sheet for Patients:  StrictlyIdeas.no Fact Sheet for Healthcare Providers: BankingDealers.co.za This test is not yet approved or cleared by the [place] FDA and has been authorized for detection and/or diagnosis of SARS-CoV-2 by FDA under an Emergency Use Authorization (EUA).  This EUA will remain in effect (meaning this test can be used) for the duration of the COVID-19 declaration under Section 564(b)(1) of the Act, 21 U.S.C. section 360bbb-3(b)(1), unless the authorization is terminated or revoked sooner. Performed at [facility], [place] [address] [place], [place] [address]   Urine culture     Status: Abnormal   Collection Time: 02/11/19  2:00 AM   Specimen: Urine, Random  Result Value Ref Range Status   Specimen Description   Final    URINE, RANDOM Performed at [facility] [address], [place], [place] [address]    Special Requests   Final    NONE Performed at [facility], [place] [address] [place], [place] [address]    Culture >=100,000 COLONIES/mL ENTEROCOCCUS FAECALIS (A)  Final   Report Status 02/12/2019 FINAL  Final   Organism ID, Bacteria ENTEROCOCCUS FAECALIS (A)  Final      Susceptibility   Enterococcus faecalis - MIC*    AMPICILLIN <=2 SENSITIVE Sensitive     LEVOFLOXACIN 2 SENSITIVE Sensitive  NITROFURANTOIN <=16 SENSITIVE Sensitive     VANCOMYCIN <=0.5 SENSITIVE Sensitive     * >=100,000 COLONIES/mL ENTEROCOCCUS FAECALIS  Culture,  sputum-assessment     Status: None   Collection Time: 02/11/19  9:41 AM   Specimen: Sputum  Result Value Ref Range Status   Specimen Description SPUTUM  Final   Special Requests NONE  Final   Sputum evaluation   Final    Sputum specimen not acceptable for testing.  Please recollect.   NOTIFIED Pauline Good [id number] Performed at [facility], [place] [address], [place], [place] [address]    Report Status 02/11/2019 FINAL  Final    Michel Bickers, MD [facility] for Infectious [place] [facility] [phone] pager   [phone] cell 02/12/2019, 12:10 PM

## 2019-02-12 NOTE — Progress Notes (Signed)
Zoe Kim   DOB:Jul 04, 1945   LH#:[id number]    ASSESSMENT & PLAN:  Endometrial cancer Her treatment has been placed on hold for over 2 weeks, since recent hospital discharge I have canceled her treatment next week pending further treatment in this hospitalization  Staphylococcal bacteremia and urinary tract infection The patient had bacteremia on January 01, 2019 She had recurrent bacteremia again with this admission I will order echocardiogram to rule out endocarditis She will continue broad-spectrum IV antibiotics  Severe anemia Likely anemia chronic illness She is not symptomatic today I will order type and cross tomorrow If her anemia is worse tomorrow, she may need blood transfusion  Code Status Full code  Goals of care Until resolution of infection  Discharge planning Unlikely over the next 3 to 5 days  All questions were answered. The patient knows to call the clinic with any problems, questions or concerns.   Heath Lark, MD 02/12/2019 8:02 AM  Subjective:  The patient is well-known to me.  I saw her recently and defer chemotherapy due to profound weakness after recent hospitalization.  She presented to the emergency department after recurrent fever and was admitted to the hospital for further management.  She has been having urinary incontinence and frequent urination for some time but denies hematuria or dysuria. She was started on broad-spectrum IV antibiotics Blood culture came back positive for staphylococcal infection Urine culture is also positive She feels well since admission.  She denies recent bleeding. Denies recent cough, chest pain or shortness of breath. No mucositis. She has some palpitation on exertion recently. 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  The patient reports 6 months or maybe longer of intermittent vaginal spotting.  She was seen by Dr. [name] on September 19, 2018 who performed a Pap smear which revealed atypical glandular cells and an endocervical biopsy which revealed a benign endocervical polyp, and endometrial biopsy which revealed high-grade serous endometrial adenocarcinoma and adenocarcinoma FIGO grade 2.    10/03/2018 Imaging   CT abdomen and pelvis 1. Enhancing soft tissue density in endometrial cavity, consistent with history of endometrial carcinoma. No evidence of extra uterine tumor extension or metastatic disease. 2. Tiny less than 1 cm anterior uterine fibroid. 3. Small hiatal hernia.  Aortic Atherosclerosis (ICD10-I70.0).   10/14/2018 Pathology Results   1. Lymph node, sentinel, biopsy, right external iliac - METASTATIC CARCINOMA PRESENT IN (1) OF (1) LYMPH NODE. 2. Lymph node, sentinel, biopsy, right deep obturator - ONE LYMPH NODE, NEGATIVE FOR CARCINOMA (0/1). 3. Lymph node, sentinel, biopsy, left external iliac - ONE LYMPH NODE, NEGATIVE FOR CARCINOMA (0/1). 4. Uterus +/- tubes/ovaries, neoplastic, cervix, bilateral fallopian tubes and ovaries - MIXED CELL CARCINOMA OF ENDOMETRIUM (MIXED ENDOMETRIOID CARCINOMA AND SEROUS CARCINOMA), WITH INVASION LESS THAN HALF OF THE MYOMETRIUM. - NO INVOLVEMENT OF UTERINE SEROSA, CERVICAL STROMA OR ADNEXA. - SEE ONCOLOGY TABLE. Microscopic Comment 4. UTERUS, CARCINOMA OR CARCINOSARCOMA Procedure: Total hysterectomy and bilateral salpingo-oophorectomy Histologic type: Mixed cell carcinoma  (Endometrioid carcinoma 70% and Serous  carcinoma 30%) Histologic Grade: Not applicable Myometrial Invasion: Present Depth of invasion: 6 mm Myometrial thickness: 17 mm Uterine Serosa Involvement: Not identified Cervical Stromal Involvement: Not identified Extent of Involvement of Other Organs: Not involved Lymphovascular Invasion: Present Regional Lymph Nodes: Examined: 3 Sentinel 0 Non-sentinel 3 Total Lymph nodes with metastases: 1 Isolated tumor cells (< 0.2 mm): 0 Micrometastasis (> 0.2 mm and < 2.0 mm): 0 Macrometastasis (> 2.0 mm): 1 Extracapsular extension: No definite ECE MMR/MSI Testing: Will be performed and the results reported separately Pathologic Stage Classification (pTNM, AJCC 8th edition): pT1a, pN1a FIGO Stage: III-C1 Representative Tumor Block: 1-A Comment: The size of the metastatic deposit in the right external iliac sentinel lymph node is 32 mm. The metastatic deposit is virtually all serous carcinoma. Intradepartmental consultation was obtained   10/14/2018 Surgery   Pre-operative Diagnosis: endometrial cancer grade 3 (serous), obesity  Operation: Robotic-assisted laparoscopic total hysterectomy with bilateral salpingoophorectomy, SLN,  biopsy (22 modifier for obesity, BMI 32).  Surgeon: Donaciano Eva  Operative Findings:  : 8cm arcuate uterus, normal appearing tubes and ovaries. Obesity with BMI 32. Narrow vaginal introitus.     10/17/2018 Cancer Staging   Staging form: Corpus Uteri - Carcinoma and Carcinosarcoma, AJCC 8th Edition - Pathologic: FIGO Stage IIIC1 (pT1, pN1a, cM0) - Signed by Heath Lark, MD on 10/17/2018    Genetic Testing   Patient has genetic testing done for MMR. Results revealed patient has the following mutation(s): MMR: Normal    Genetic Testing   Patient has genetic testing done for MSI on pathology from 10/14/18. Results revealed patient has the following: MSI Stable   11/06/2018 Procedure   Placement of single lumen  port a cath via right internal jugular vein. The catheter tip lies at the cavo-atrial junction. A power injectable port a cath was placed and is ready for immediate use.   11/10/2018 -  Chemotherapy   The patient had carboplatin and taxol for chemotherapy treatment.     11/10/2018 -  Chemotherapy   The patient had carboplatin and taxol for chemotherapy treatment.       Objective:  Vitals:   02/12/19 0605 02/12/19 0650  BP:    Pulse: (!) 108 (!) 102  Resp:    Temp:  99.5 F (37.5 C)  SpO2:       Intake/Output Summary (Last 24 hours) at 02/12/2019 0802 Last data filed at 02/12/2019 0606 Gross per 24 hour  Intake 636.14 ml  Output 1000 ml  Net -363.86 ml    GENERAL:alert, no distress and comfortable.  She looks pale SKIN: skin color, texture, turgor are normal, no rashes or significant lesions EYES: normal, Conjunctiva are pink and non-injected, sclera clear OROPHARYNX:no exudate, no erythema and lips, buccal mucosa, and tongue normal  NECK: supple, thyroid normal size, non-tender, without nodularity LYMPH:  no palpable lymphadenopathy in the cervical, axillary or inguinal LUNGS: clear to auscultation and percussion with normal breathing effort HEART: regular rate & rhythm and no murmurs and no lower extremity edema ABDOMEN:abdomen soft, non-tender and normal bowel sounds Musculoskeletal:no cyanosis of digits and no clubbing  NEURO: alert & oriented x 3 with fluent speech, no focal motor/sensory deficits   Labs:  Recent Labs    02/10/19 2116 02/11/19 0923 02/11/19 1413 02/12/19 0532  NA 133* 137 134* 132*  K 4.0 3.6 3.5 3.5  CL 98 111 103 103  CO2 23 21* 21* 20*  GLUCOSE 116* 99 127* 124*  BUN '13 11 11 11  '$ CREATININE 0.91  0.77 0.81 0.82  CALCIUM 9.4 7.6* 8.2* 7.6*  GFRNONAA >60 >60 >60 >60  GFRAA >60 >60 >60 >60  PROT 7.3 5.6*  --  5.7*  ALBUMIN 4.3 3.1*  --  3.0*  AST 33 21  --  22  ALT 27 19  --  19  ALKPHOS 121 87  --  79  BILITOT 0.8 0.6  --  0.8     Studies: I have reviewed her chest x-ray Dg Chest 2 View  Result Date: 02/10/2019 CLINICAL DATA:  Suspected sepsis EXAM: CHEST - 2 VIEW COMPARISON:  December 30, 2018 FINDINGS: The heart size and mediastinal contours are within normal limits. There is a right-sided MediPort catheter with the tip at the superior cavoatrial junction. Both lungs are clear. No acute osseous abnormality. IMPRESSION: No acute cardiopulmonary disease. Electronically Signed   By: Prudencio Pair M.D.   On: 02/10/2019 21:38

## 2019-02-12 NOTE — Progress Notes (Signed)
   02/12/19 0528  Vitals  Temp (!) 101.6 F (38.7 C) (nurse notified)  Temp Source Oral  BP 118/72  MAP (mmHg) 86  BP Location Right Arm  BP Method Automatic  Patient Position (if appropriate) Lying  Pulse Rate (!) 115  Pulse Rate Source Dinamap  Resp 18  Oxygen Therapy  SpO2 96 %  O2 Device Room Air  MEWS Score  MEWS RR 0  MEWS Pulse 2  MEWS Systolic 0  MEWS LOC 0  MEWS Temp 2  MEWS Score 4  MEWS Score Color Red  mews initiated

## 2019-02-13 ENCOUNTER — Encounter (HOSPITAL_COMMUNITY): Payer: Self-pay | Admitting: Interventional Radiology

## 2019-02-13 ENCOUNTER — Inpatient Hospital Stay (HOSPITAL_COMMUNITY): Payer: PPO

## 2019-02-13 ENCOUNTER — Telehealth: Payer: Self-pay

## 2019-02-13 DIAGNOSIS — B9561 Methicillin susceptible Staphylococcus aureus infection as the cause of diseases classified elsewhere: Secondary | ICD-10-CM

## 2019-02-13 DIAGNOSIS — R350 Frequency of micturition: Secondary | ICD-10-CM

## 2019-02-13 DIAGNOSIS — R32 Unspecified urinary incontinence: Secondary | ICD-10-CM | POA: Diagnosis present

## 2019-02-13 DIAGNOSIS — T80211A Bloodstream infection due to central venous catheter, initial encounter: Principal | ICD-10-CM

## 2019-02-13 HISTORY — PX: IR REMOVAL TUN ACCESS W/ PORT W/O FL MOD SED: IMG2290

## 2019-02-13 LAB — IRON AND TIBC
Iron: 24 ug/dL — ABNORMAL LOW (ref 28–170)
Saturation Ratios: 12 % (ref 10.4–31.8)
TIBC: 195 ug/dL — ABNORMAL LOW (ref 250–450)
UIBC: 171 ug/dL

## 2019-02-13 LAB — CBC WITH DIFFERENTIAL/PLATELET
Abs Immature Granulocytes: 0.05 10*3/uL (ref 0.00–0.07)
Basophils Absolute: 0 10*3/uL (ref 0.0–0.1)
Basophils Relative: 0 %
Eosinophils Absolute: 0.3 10*3/uL (ref 0.0–0.5)
Eosinophils Relative: 3 %
HCT: 22 % — ABNORMAL LOW (ref 36.0–46.0)
Hemoglobin: 7.3 g/dL — ABNORMAL LOW (ref 12.0–15.0)
Immature Granulocytes: 1 %
Lymphocytes Relative: 18 %
Lymphs Abs: 1.3 10*3/uL (ref 0.7–4.0)
MCH: 33.8 pg (ref 26.0–34.0)
MCHC: 33.2 g/dL (ref 30.0–36.0)
MCV: 101.9 fL — ABNORMAL HIGH (ref 80.0–100.0)
Monocytes Absolute: 1 10*3/uL (ref 0.1–1.0)
Monocytes Relative: 13 %
Neutro Abs: 4.6 10*3/uL (ref 1.7–7.7)
Neutrophils Relative %: 65 %
Platelets: 199 10*3/uL (ref 150–400)
RBC: 2.16 MIL/uL — ABNORMAL LOW (ref 3.87–5.11)
RDW: 15.9 % — ABNORMAL HIGH (ref 11.5–15.5)
WBC: 7.3 10*3/uL (ref 4.0–10.5)
nRBC: 0 % (ref 0.0–0.2)

## 2019-02-13 LAB — COMPREHENSIVE METABOLIC PANEL
ALT: 16 U/L (ref 0–44)
AST: 19 U/L (ref 15–41)
Albumin: 2.8 g/dL — ABNORMAL LOW (ref 3.5–5.0)
Alkaline Phosphatase: 72 U/L (ref 38–126)
Anion gap: 8 (ref 5–15)
BUN: 11 mg/dL (ref 8–23)
CO2: 23 mmol/L (ref 22–32)
Calcium: 8.3 mg/dL — ABNORMAL LOW (ref 8.9–10.3)
Chloride: 105 mmol/L (ref 98–111)
Creatinine, Ser: 0.72 mg/dL (ref 0.44–1.00)
GFR calc Af Amer: >60 mL/min (ref >60)
GFR calc non Af Amer: >60 mL/min (ref >60)
Glucose, Bld: 93 mg/dL (ref 70–99)
Potassium: 3.5 mmol/L (ref 3.5–5.1)
Sodium: 136 mmol/L (ref 135–145)
Total Bilirubin: 0.5 mg/dL (ref 0.3–1.2)
Total Protein: 5.6 g/dL — ABNORMAL LOW (ref 6.5–8.1)

## 2019-02-13 LAB — RETICULOCYTES
Immature Retic Fract: 12.3 % (ref 2.3–15.9)
RBC.: 2.16 MIL/uL — ABNORMAL LOW (ref 3.87–5.11)
Retic Count, Absolute: 55.7 10*3/uL (ref 19.0–186.0)
Retic Ct Pct: 2.6 % (ref 0.4–3.1)

## 2019-02-13 LAB — PHOSPHORUS: Phosphorus: 3 mg/dL (ref 2.5–4.6)

## 2019-02-13 LAB — HEMOGLOBIN AND HEMATOCRIT, BLOOD
HCT: 25.6 % — ABNORMAL LOW (ref 36.0–46.0)
Hemoglobin: 8.9 g/dL — ABNORMAL LOW (ref 12.0–15.0)

## 2019-02-13 LAB — FOLATE: Folate: 21.3 ng/mL (ref >5.9)

## 2019-02-13 LAB — MAGNESIUM: Magnesium: 2 mg/dL (ref 1.7–2.4)

## 2019-02-13 LAB — FERRITIN: Ferritin: 261 ng/mL (ref 11–307)

## 2019-02-13 LAB — VITAMIN B12: Vitamin B-12: 1447 pg/mL — ABNORMAL HIGH (ref 180–914)

## 2019-02-13 LAB — PREPARE RBC (CROSSMATCH)

## 2019-02-13 MED ORDER — ENOXAPARIN SODIUM 40 MG/0.4ML ~~LOC~~ SOLN
40.0000 mg | SUBCUTANEOUS | Status: DC
  Administered 2019-02-14 – 2019-02-16 (×3): 40 mg via SUBCUTANEOUS
  Filled 2019-02-13 (×3): qty 0.4

## 2019-02-13 MED ORDER — SODIUM CHLORIDE 0.9% IV SOLUTION
Freq: Once | INTRAVENOUS | Status: AC
  Administered 2019-02-13: 09:00:00 via INTRAVENOUS

## 2019-02-13 MED ORDER — SODIUM CHLORIDE 0.9% FLUSH
10.0000 mL | INTRAVENOUS | Status: DC | PRN
  Administered 2019-02-13 – 2019-02-17 (×3): 10 mL
  Filled 2019-02-13 (×3): qty 40

## 2019-02-13 MED ORDER — LIDOCAINE HCL 1 % IJ SOLN
INTRAMUSCULAR | Status: AC | PRN
  Administered 2019-02-13: 5 mL

## 2019-02-13 MED ORDER — DIPHENHYDRAMINE HCL 25 MG PO CAPS
25.0000 mg | ORAL_CAPSULE | Freq: Once | ORAL | Status: AC
  Administered 2019-02-13: 25 mg via ORAL
  Filled 2019-02-13: qty 1

## 2019-02-13 MED ORDER — CHLORHEXIDINE GLUCONATE CLOTH 2 % EX PADS
6.0000 | MEDICATED_PAD | Freq: Every day | CUTANEOUS | Status: DC
  Administered 2019-02-13 – 2019-02-16 (×4): 6 via TOPICAL

## 2019-02-13 MED ORDER — LIDOCAINE-EPINEPHRINE 1 %-1:100000 IJ SOLN
INTRAMUSCULAR | Status: AC
  Filled 2019-02-13: qty 1

## 2019-02-13 MED ORDER — ACETAMINOPHEN 325 MG PO TABS
650.0000 mg | ORAL_TABLET | Freq: Once | ORAL | Status: AC
  Administered 2019-02-13: 650 mg via ORAL
  Filled 2019-02-13: qty 2

## 2019-02-13 NOTE — Progress Notes (Signed)
OT Cancellation Note  Patient Details Name: Zoe Kim MRN: [id number] DOB: [birth year]   Cancelled Treatment:    Reason Eval/Treat Not Completed: Other (comment).  Pt for port removal this pm.  Sleeping very soundly. Will check back tomorrow  Jasiri Hanawalt 02/13/2019, 3:29 PM  Lesle Chris, OTR/L Acute Rehabilitation Services [phone] [facility] pager [phone] office 02/13/2019

## 2019-02-13 NOTE — Telephone Encounter (Signed)
He called and left a message asking for a update on his wife.

## 2019-02-13 NOTE — Progress Notes (Signed)
PT Cancellation Note  Patient Details Name: Zoe Kim MRN: [id number] DOB: 1945-10-07   Cancelled Treatment:    Reason Eval/Treat Not Completed: Medical issues which prohibited therapy Per chart review, HCT 22.0 which is below [facility] recommended cut off for safe mobility. Plan to hold PT for now.  If this patient has urgent PT needs or MD would like her to be seen regardless of HCT levels, please page at number below.     Deniece Ree PT, DPT, CBIS  Supplemental Physical Therapist [facility]    Pager [phone] Acute Rehab Office [phone]

## 2019-02-13 NOTE — Telephone Encounter (Signed)
Called and given below message and number of [phone] east to call for update. He verbalized understanding.

## 2019-02-13 NOTE — Telephone Encounter (Signed)
Her husband need to contact the floor to get hold of the hospitalist for update since I am not there the whole time to know everything that is going on

## 2019-02-13 NOTE — Progress Notes (Signed)
Patient ID: Zoe Kim, female   DOB: [birth year], 73 y.o.   MRN: [id number] Request received from oncology for Port-A-Cath removal on patient.  Port was placed by IR service on 11/06/2018 for chemotherapy secondary to endometrial cancer.  She now presents with bacteremia and fevers.  Port site appears clean and dry with no erythema or significant drainage or tenderness.  Options of continuing antibiotic therapy and maintaining Port-A-Cath versus port removal and PICC placement were discussed with patient.  She has decided to proceed with port removal and PICC placement.  Details/risks of procedures, including but not limited to, internal bleeding, infection, injury to adjacent structures discussed with pt with her understanding and consent.  Procedure scheduled for this afternoon.She prefers only local anesthetic for case.

## 2019-02-13 NOTE — Progress Notes (Signed)
Received call from [employer], patient had 5 beat run of V-Tach. Upon entering room, patient is talking on phone..denies chest pain, stable vitals. Notified Dr. Alfredia Ferguson.

## 2019-02-13 NOTE — Progress Notes (Signed)
Zoe Kim   DOB:05/29/1946   [id number]    ASSESSMENT & PLAN:   Endometrial cancer Her treatment has been placed on hold for over 2 weeks, since recent hospital discharge I have canceled her treatment next week pending further treatment in this hospitalization  Staphylococcal bacteremia and urinary tract infection The patient had bacteremia on January 01, 2019 She had recurrent bacteremia again with this admission Echocardiogram show no evidence of endocarditis but in view of persistent fever despite antibiotics, I recommend port extraction She will continue broad-spectrum IV antibiotics  Severe anemia Likely anemia chronic illness We discussed some of the risks, benefits, and alternatives of blood transfusions. The patient is symptomatic from anemia and the hemoglobin level is critically low.  Some of the side-effects to be expected including risks of transfusion reactions, chills, infection, syndrome of volume overload and risk of hospitalization from various reasons and the patient is willing to proceed and went ahead to sign consent today. She will receive 1 unit of blood today  Code Status Full code  Goals of care Until resolution of infection, for at least 24 hours or more  Discharge planning Unlikely over the next 3 to 5 days All questions were answered. The patient knows to call the clinic with any problems, questions or concerns.   Heath Lark, MD 02/13/2019 9:29 AM  Subjective:  She is noted to have persistent recurrent fevers She denies chills She has not moved or eaten much since admission The patient denies any recent signs or symptoms of bleeding such as spontaneous epistaxis, hematuria or hematochezia. No recent nausea or changes in bowel habits  Objective:  Vitals:   02/13/19 0840 02/13/19 0916  BP: 135/67 133/70  Pulse: 92 91  Resp: 20 18  Temp: 99 F (37.2 C) 98.9 F (37.2 C)  SpO2: 94% 94%     Intake/Output Summary (Last 24 hours) at  02/13/2019 0929 Last data filed at 02/13/2019 0658 Gross per 24 hour  Intake 639.62 ml  Output 2050 ml  Net -1410.38 ml    GENERAL:alert, no distress and comfortable.  She looks pale SKIN: skin color, texture, turgor are normal, no rashes or significant lesions EYES: normal, Conjunctiva are pink and non-injected, sclera clear OROPHARYNX:no exudate, no erythema and lips, buccal mucosa, and tongue normal  NECK: supple, thyroid normal size, non-tender, without nodularity LYMPH:  no palpable lymphadenopathy in the cervical, axillary or inguinal LUNGS: clear to auscultation and percussion with normal breathing effort HEART: Tachycardia, no murmurs and no lower extremity edema ABDOMEN:abdomen soft, non-tender and normal bowel sounds Musculoskeletal:no cyanosis of digits and no clubbing  NEURO: alert & oriented x 3 with fluent speech, no focal motor/sensory deficits   Labs:  Recent Labs    02/11/19 0923 02/11/19 1413 02/12/19 0532 02/13/19 0529  NA 137 134* 132* 136  K 3.6 3.5 3.5 3.5  CL 111 103 103 105  CO2 21* 21* 20* 23  GLUCOSE 99 127* 124* 93  BUN 11 11 11 11   CREATININE 0.77 0.81 0.82 0.72  CALCIUM 7.6* 8.2* 7.6* 8.3*  GFRNONAA >60 >60 >60 >60  GFRAA >60 >60 >60 >60  PROT 5.6*  --  5.7* 5.6*  ALBUMIN 3.1*  --  3.0* 2.8*  AST 21  --  22 19  ALT 19  --  19 16  ALKPHOS 87  --  79 72  BILITOT 0.6  --  0.8 0.5    Studies:  Dg Chest 2 View  Result Date: 02/10/2019 CLINICAL DATA:  Suspected sepsis EXAM: CHEST - 2 VIEW COMPARISON:  December 30, 2018 FINDINGS: The heart size and mediastinal contours are within normal limits. There is a right-sided MediPort catheter with the tip at the superior cavoatrial junction. Both lungs are clear. No acute osseous abnormality. IMPRESSION: No acute cardiopulmonary disease. Electronically Signed   By: Prudencio Pair M.D.   On: 02/10/2019 21:38

## 2019-02-13 NOTE — Progress Notes (Signed)
Patient ID: Zoe Kim, female   DOB: 02/14/1946, 73 y.o.   MRN: [id number]         [facility]  Date of Admission:  02/10/2019           Day 3 vancomycin ASSESSMENT: Both sets of admission blood cultures are positive.  1 set is growing 2 different gram-positive cocci and a third in the second set.  The BCID showed only methicillin susceptible coagulase-negative staph.  We should have final identification and susceptibilities on the isolates by tomorrow to determine if they are all the same or different. This will help determine if she has true bacteremia or multiple contaminants.  I suspect that she does have true bacteremia and it may be coming from her Port-A-Cath.  Current ID Society guidelines suggest treating with antibiotics through the Port-A-Cath and attempting to salvage it.  If she does not improve or if she relapsed that would be a clear indication for removal.  Her urine culture has grown a fully susceptible Enterococcus faecalis but I suspect that it is a Publishing copy.  I will continue vancomycin pending final blood culture results.  PLAN: 1. Continue vancomycin pending final blood culture results  Principal Problem:   Fever Active Problems:   Coagulase negative Staphylococcus bacteremia   Endometrial cancer (HCC)   Peripheral neuropathy due to chemotherapy (HCC)   Anemia due to antineoplastic chemotherapy   Urinary incontinence   Scheduled Meds: . Chlorhexidine Gluconate Cloth  6 each Topical Daily  . enoxaparin (LOVENOX) injection  40 mg Subcutaneous Q24H  . gabapentin  300 mg Oral BID  . sodium chloride flush  3 mL Intravenous Q12H   Continuous Infusions: . sodium chloride 75 mL/hr at 02/12/19 1340  . vancomycin 1,250 mg (02/13/19 0136)   PRN Meds:.acetaminophen **OR** acetaminophen, guaiFENesin-dextromethorphan, Melatonin, morphine injection, ondansetron **OR** ondansetron (ZOFRAN) IV, polyethylene glycol, sodium  chloride flush, traMADol   SUBJECTIVE: She had fever again last night but is feeling better this morning.  She is not having any more nausea but has a very poor appetite and has not been eaten anything today.  Review of Systems: Review of Systems  Constitutional: Positive for fever and malaise/fatigue. Negative for chills, diaphoresis and weight loss.  HENT: Negative for congestion and sore throat.   Respiratory: Negative for cough, sputum production and shortness of breath.   Cardiovascular: Negative for chest pain.  Gastrointestinal: Positive for constipation. Negative for abdominal pain, diarrhea, nausea and vomiting.  Genitourinary: Positive for frequency. Negative for dysuria and urgency.  Musculoskeletal: Negative for back pain, joint pain and myalgias.  Neurological: Negative for headaches.    Allergies  Allergen Reactions  . Sulfa Antibiotics     UNSPECIFIED REACTION     OBJECTIVE: Vitals:   02/12/19 2100 02/13/19 0515 02/13/19 0840 02/13/19 0916  BP:  (!) 109/55 135/67 133/70  Pulse:  88 92 91  Resp:  16 20 18   Temp: 99.8 F (37.7 C) 98.6 F (37 C) 99 F (37.2 C) 98.9 F (37.2 C)  TempSrc: Oral  Oral Oral  SpO2:  97% 94% 94%  Weight:      Height:       Body mass index is 29.18 kg/m.  Physical Exam Constitutional:      Comments: He is very pleasant and in good spirits.  She is sitting up in bed.  HENT:     Mouth/Throat:     Pharynx: No oropharyngeal exudate.  Eyes:     Conjunctiva/sclera: Conjunctivae  normal.  Cardiovascular:     Rate and Rhythm: Normal rate and regular rhythm.     Heart sounds: No murmur.  Pulmonary:     Effort: Pulmonary effort is normal.     Breath sounds: Normal breath sounds.  Chest:    Abdominal:     Palpations: Abdomen is soft.     Tenderness: There is no abdominal tenderness.  Musculoskeletal:        General: No swelling or tenderness.  Psychiatric:        Mood and Affect: Mood normal.     Lab Results Lab Results   Component Value Date   WBC 7.3 02/13/2019   HGB 7.3 (L) 02/13/2019   HCT 22.0 (L) 02/13/2019   MCV 101.9 (H) 02/13/2019   PLT 199 02/13/2019    Lab Results  Component Value Date   CREATININE 0.72 02/13/2019   BUN 11 02/13/2019   NA 136 02/13/2019   K 3.5 02/13/2019   CL 105 02/13/2019   CO2 23 02/13/2019    Lab Results  Component Value Date   ALT 16 02/13/2019   AST 19 02/13/2019   ALKPHOS 72 02/13/2019   BILITOT 0.5 02/13/2019     Microbiology: Recent Results (from the past 240 hour(s))  Culture, blood (Routine x 2)     Status: None (Preliminary result)   Collection Time: 02/10/19  9:16 PM   Specimen: BLOOD  Result Value Ref Range Status   Specimen Description   Final    BLOOD RIGHT WRIST Performed at [facility], [place] [address] [place], [place] [address]    Special Requests   Final    BOTTLES DRAWN AEROBIC AND ANAEROBIC Blood Culture adequate volume Performed at [place] [address] [place], [place] [address]    Culture  Setup Time   Final    GRAM POSITIVE COCCI AEROBIC BOTTLE ONLY CRITICAL RESULT CALLED TO, READ BACK BY AND VERIFIED WITH: DR Taeja Debellis [id number] AT 1157 AM BY CM Performed at [facility], [place] [address], [place], [place] [address]    Culture PENDING  Incomplete   Report Status PENDING  Incomplete  Culture, blood (Routine x 2)     Status: None (Preliminary result)   Collection Time: 02/10/19  9:21 PM   Specimen: BLOOD  Result Value Ref Range Status   Specimen Description   Final    BLOOD BLOOD RIGHT HAND Performed at [place] [address], [place], [place] [address]    Special Requests   Final    BOTTLES DRAWN AEROBIC AND ANAEROBIC Blood Culture adequate volume Performed at [place] [address] [place], [place] [address]    Culture  Setup Time   Final    GRAM POSITIVE COCCI IN BOTH AEROBIC AND ANAEROBIC BOTTLES CRITICAL RESULT CALLED  TO, READ BACK BY AND VERIFIED WITH: Sheffield Slider PharmD 17:00 02/11/19 ([email or web address])    Culture   Final    GRAM POSITIVE COCCI CULTURE REINCUBATED FOR BETTER GROWTH Performed at [facility], [place] [address] [place], [place] [address]    Report Status PENDING  Incomplete  Blood Culture ID Panel (Reflexed)     Status: Abnormal   Collection Time: 02/10/19  9:21 PM  Result Value Ref Range Status   Enterococcus species NOT DETECTED NOT DETECTED Final   Listeria monocytogenes NOT DETECTED NOT DETECTED Final   Staphylococcus species DETECTED (A) NOT DETECTED Final    Comment: Methicillin (oxacillin)  susceptible coagulase negative staphylococcus. Possible blood culture contaminant (unless isolated from more than one blood culture draw or clinical case suggests pathogenicity). No antibiotic treatment is indicated for blood  culture contaminants. CRITICAL RESULT CALLED TO, READ BACK BY AND VERIFIED WITH: Sheffield Slider PharmD 17:00 02/11/19 ([email or web address])    Staphylococcus aureus (BCID) NOT DETECTED NOT DETECTED Final   Methicillin resistance NOT DETECTED NOT DETECTED Final   Streptococcus species NOT DETECTED NOT DETECTED Final   Streptococcus agalactiae NOT DETECTED NOT DETECTED Final   Streptococcus pneumoniae NOT DETECTED NOT DETECTED Final   Streptococcus pyogenes NOT DETECTED NOT DETECTED Final   Acinetobacter baumannii NOT DETECTED NOT DETECTED Final   Enterobacteriaceae species NOT DETECTED NOT DETECTED Final   Enterobacter cloacae complex NOT DETECTED NOT DETECTED Final   Escherichia coli NOT DETECTED NOT DETECTED Final   Klebsiella oxytoca NOT DETECTED NOT DETECTED Final   Klebsiella pneumoniae NOT DETECTED NOT DETECTED Final   Proteus species NOT DETECTED NOT DETECTED Final   Serratia marcescens NOT DETECTED NOT DETECTED Final   Haemophilus influenzae NOT DETECTED NOT DETECTED Final   Neisseria meningitidis NOT DETECTED NOT DETECTED Final   Pseudomonas aeruginosa NOT DETECTED NOT DETECTED  Final   Candida albicans NOT DETECTED NOT DETECTED Final   Candida glabrata NOT DETECTED NOT DETECTED Final   Candida krusei NOT DETECTED NOT DETECTED Final   Candida parapsilosis NOT DETECTED NOT DETECTED Final   Candida tropicalis NOT DETECTED NOT DETECTED Final    Comment: Performed at [facility], [address] [place], [place] [address]  SARS Coronavirus 2 [facility] order, Performed in [facility] hospital lab) Nasopharyngeal Nasopharyngeal Swab     Status: None   Collection Time: 02/10/19 11:23 PM   Specimen: Nasopharyngeal Swab  Result Value Ref Range Status   SARS Coronavirus 2 NEGATIVE NEGATIVE Final    Comment: (NOTE) If result is NEGATIVE SARS-CoV-2 target nucleic acids are NOT DETECTED. The SARS-CoV-2 RNA is generally detectable in upper and lower  respiratory specimens during the acute phase of infection. The lowest  concentration of SARS-CoV-2 viral copies this assay can detect is 250  copies / mL. A negative result does not preclude SARS-CoV-2 infection  and should not be used as the sole basis for treatment or other  patient management decisions.  A negative result may occur with  improper specimen collection / handling, submission of specimen other  than nasopharyngeal swab, presence of viral mutation(s) within the  areas targeted by this assay, and inadequate number of viral copies  (<250 copies / mL). A negative result must be combined with clinical  observations, patient history, and epidemiological information. If result is POSITIVE SARS-CoV-2 target nucleic acids are DETECTED. The SARS-CoV-2 RNA is generally detectable in upper and lower  respiratory specimens dur ing the acute phase of infection.  Positive  results are indicative of active infection with SARS-CoV-2.  Clinical  correlation with patient history and other diagnostic information is  necessary to determine patient infection status.  Positive results do  not rule out bacterial infection or  co-infection with other viruses. If result is PRESUMPTIVE POSTIVE SARS-CoV-2 nucleic acids MAY BE PRESENT.   A presumptive positive result was obtained on the submitted specimen  and confirmed on repeat testing.  While 2019 novel coronavirus  (SARS-CoV-2) nucleic acids may be present in the submitted sample  additional confirmatory testing may be necessary for epidemiological  and / or clinical management purposes  to differentiate between  SARS-CoV-2 and other Sarbecovirus currently known  to infect humans.  If clinically indicated additional testing with an alternate test  methodology [phone]) is advised. The SARS-CoV-2 RNA is generally  detectable in upper and lower respiratory sp ecimens during the acute  phase of infection. The expected result is Negative. Fact Sheet for Patients:  StrictlyIdeas.no Fact Sheet for Healthcare Providers: BankingDealers.co.za This test is not yet approved or cleared by the [place] FDA and has been authorized for detection and/or diagnosis of SARS-CoV-2 by FDA under an Emergency Use Authorization (EUA).  This EUA will remain in effect (meaning this test can be used) for the duration of the COVID-19 declaration under Section 564(b)(1) of the Act, 21 U.S.C. section 360bbb-3(b)(1), unless the authorization is terminated or revoked sooner. Performed at [facility], [place] [address] [place], [place] [address]   Urine culture     Status: Abnormal   Collection Time: 02/11/19  2:00 AM   Specimen: Urine, Random  Result Value Ref Range Status   Specimen Description   Final    URINE, RANDOM Performed at [place] [address], [place],  [address]    Special Requests   Final    NONE Performed at [facility], [place] [address] [place],  [address]    Culture >=100,000 COLONIES/mL ENTEROCOCCUS FAECALIS (A)  Final   Report Status  02/12/2019 FINAL  Final   Organism ID, Bacteria ENTEROCOCCUS FAECALIS (A)  Final      Susceptibility   Enterococcus faecalis - MIC*    AMPICILLIN <=2 SENSITIVE Sensitive     LEVOFLOXACIN 2 SENSITIVE Sensitive     NITROFURANTOIN <=16 SENSITIVE Sensitive     VANCOMYCIN <=0.5 SENSITIVE Sensitive     * >=100,000 COLONIES/mL ENTEROCOCCUS FAECALIS  Culture, sputum-assessment     Status: None   Collection Time: 02/11/19  9:41 AM   Specimen: Sputum  Result Value Ref Range Status   Specimen Description SPUTUM  Final   Special Requests NONE  Final   Sputum evaluation   Final    Sputum specimen not acceptable for testing.  Please recollect.   NOTIFIED Pauline Good [id number] Performed at [facility], [place] [address] [place],  [address]    Report Status 02/11/2019 FINAL  Final    Michel Bickers, MD [facility] [phone] pager   [phone] cell 02/13/2019, 9:34 AM

## 2019-02-13 NOTE — Progress Notes (Signed)
PROGRESS NOTE    Zoe Kim  [id number] DOB: [birth year] DOA: 02/10/2019 PCP: [name], MD   Brief Narrative:  Patient is a 73 year old female with a past medical history significant for but not limited to endometrial cancer currently undergoing treatment with radiation and chemotherapy which is complicated by peripheral neuropathy and anemia who presented to the ED with a day of fevers, chills, fatigue and generalized weakness.  She reports that she woke up on the morning of 02/10/2019 with fevers chills and malaise and intermittently over the last month or so she has been having similar symptoms but had been worsening recently.  Denied any sick contacts.  In the ED she was found to be febrile and saturating on room air with tachycardia and a stable blood pressure. COVID Testing was negative.  Urinalysis showed some rare bacteria, moderate leukocytes, 11-20 WBCs per high-power field.    In the ED she was given normal saline 1.5 L boluses and started on broad-spectrum antibiotics with IV vancomycin IV cefepime.  Further work-up reveals a MSSA Gram Postive Cocci Bacteremia and urine is growing out greater than 100,000 colony-forming units of Enterococcus faecalis.  Infectious disease was consulted for further evaluation recommendations and an echocardiogram has been ordered for her bacteremia.  Dr. Megan Salon was to adjust her antibiotic regimen currently and further work-up has been pursued and blood cultures were repeated which are showing NGTD at 2 Days.  Likely source of her infection is her Port-A-Cath and patient has elected for Port-A-Cath to be removed even though ID states that she can keep it.  Dr. Megan Salon we continue vancomycin pending final blood culture results.  Interventional radiology was consulted for Port-A-Cath removal and since this is the patient's second bacteremia since July.  Medical oncology will be typing and transfusing and 1 unit of PRBCs given patient's  anemia.  Assessment & Plan:   Principal Problem:   Fever Active Problems:   Endometrial cancer ([place])   Peripheral neuropathy due to chemotherapy ([facility])   Anemia due to antineoplastic chemotherapy   Coagulase negative Staphylococcus bacteremia   Urinary incontinence   Sepsis 2/2 a Gram + Cocci Bacteremia and Enterococcus UTI in setting of infected Port-A-Cath, ruled in and poA -Presents with one day of fever and fatigue, and is found to be febrile and tachycardic in ED reasurringly normal lactate, clear CXR,negative COVID-19 test, nourinary sxs, noabd pain or N/V/D,nomeningismus, noapparent cellulitis,no erythema or tenderness about theport in right chest that is functioning appropriately -Blood and urine cultures collected in ED and grew out MSSA Staph Species and Urine is growing out >100,000 CFU of Enterococcus which was pan-sensitive; repeat blood cultures on 02/11/2019 showed no growth to date at 2 days -She was given 1.5 liters of NS as boluses, and broad spectrum antibiotics initiatedwith IV Van, Cefepime and Flagyl; Will defer Abx Management to ID as I have consulted Dr. Megan Salon and he recommends continue IV vancomycin for now until further blood cultures are out and he has discontinued IV Cefepime and Flagyl - IVF now resumed at 75 mL/hr -Continue empiric antibiotics while following cultures and clinical course -Have ordered an echocardiogram because of her suspected bacteremia and showed no vegetations or evidence of endocarditis -Repeated blood cultures 02/11/2019 showed no growth to date at 2 days -Continue to monitor temperature curve and cultures; patient is still spiking temperatures and had a T-max of 102 and sepsis physiology is improving but she continues to spike temperatures -Dr. Simeon Craft such recommending Port-A-Cath extraction and has  consulted interventional radiology for Port-A-Cath removal. -We will obtain PT and OT to evaluate and treat given patient's fatigue  but will be done after patient's hematocrit improved  Enterococcus urinary tract infection versus colonization, present on admission -As above -Urinalysis grew out straw-colored urine, moderate leukocytes, rare bacteria, 11-20 WBCs, urine culture grew out greater than 100,000 colony-forming units of enterococcus faecalis which is pansensitive -ID was consulted and will defer antibiotic management to them as we have initiated broad-spectrum antibiotics on admission and this was continued.  And she will be continued on IV vancomycin pending further cultures  -Dr. Megan Salon feels that her urine may be colonized with enterococcus as she is showing no symptoms she was however having some urinary frequency and incontinence earlier prior to admission  Endometrial cancer -Undergoing treatment with chemotherapy and radiationbut her treatment had been placed on hold for over 2 weeks since her recent hospital discharge.  Dr. Alvy Bimler as canceled her next treatment next week pending further treatment this hospitalization -Continue gabapentin for associated neuropathy -We will consult her primary oncologist Dr. Alvy Bimler for further evaluation recommendations  Normocytic/Macrocytic Anemia -Hgb is 8.9 on admission, stable and secondary to cancer treatment  - Hemoglobin/hematocrit now is dropped to 7.3/22.0 -Medical oncology is typing and screening transfusing 1 unit PRBCs for this patient - Continue to monitor for signs and symptoms of bleeding; currently no overt bleeding noted - Checked anemia panel this AM and showed an iron level of 24, U IBC 171, TIBC 195, saturation ratios of 12%, ferritin level of 261, folate level 21.3, vitamin B12 1447 - Repeat CBC in a.m.  Metabolic Acidosis -Patient's CO2 is now 23 and anion gap is now 8 -Continue monitor trend continue with IV fluid hydration as above 75 mL per hour -Repeat CMP in a.m.  Hyponatremia, improved -Patient sodium on admission is 133 -Had  initially improved to 137 but then trended back down to 132 and today is 136 -Will now start on IVF with NS at 75 mL/hr -Continue to Monitor trend and repeat CMP in a.m.  Hypomagnesemia, improved -Patient's Mag Level this AM was 2.0 -Replete with IV mag sulfate 2 g yesterday -Continue to monitor and replete as necessary -Repeat magnesium level in a.m.  Hyperglycemia -Likely reactive in the setting of infection however will need to rule out diabetes mellitus and obtain a hemoglobin A1c in the a.m. -Blood sugars on daily BMPs/CMP's have been ranging from 93-127 -Continue to monitor blood sugars carefully and if necessary will need to place on a sensitive NovoLog/scale insulin AC  DVT prophylaxis: Enoxaparin 40 mg subcu every 24h Code Status: FULL CODE Family Communication:  No family present at bedside  Disposition Plan: Remain inpatient for continued work-up and treatment and continuing antibiotics with Port-A-Cath removal today  Consultants:   Medical Oncology  Infectious Diseases  Interventional radiology   Procedures:  ECHOCARDIOGRAM  IMPRESSIONS    1. The left ventricle has hyperdynamic systolic function, with an ejection fraction of >65%. The cavity size was normal. There is mild concentric left ventricular hypertrophy. Left ventricular diastolic Doppler parameters are consistent with impaired  relaxation.  2. The right ventricle has normal systolic function. The cavity was normal. There is no increase in right ventricular wall thickness. Right ventricular systolic pressure is normal.  3. The aorta is normal unless otherwise noted.  SUMMARY   No vegatation was seen.  FINDINGS  Left Ventricle: The left ventricle has hyperdynamic systolic function, with an ejection fraction of >65%. The cavity size was  normal. There is mild concentric left ventricular hypertrophy. Left ventricular diastolic Doppler parameters are consistent  with impaired relaxation. Normal left  ventricular filling pressures  Right Ventricle: The right ventricle has normal systolic function. The cavity was normal. There is no increase in right ventricular wall thickness. Right ventricular systolic pressure is normal.  Left Atrium: Left atrial size was normal in size.  Right Atrium: Right atrial size was normal in size.  Interatrial Septum: No atrial level shunt detected by color flow Doppler.  Pericardium: There is no evidence of pericardial effusion.  Mitral Valve: The mitral valve is normal in structure. Mitral valve regurgitation is not visualized by color flow Doppler.  Tricuspid Valve: The tricuspid valve is normal in structure. Tricuspid valve regurgitation was not visualized by color flow Doppler.  Aortic Valve: The aortic valve is normal in structure. Aortic valve regurgitation was not visualized by color flow Doppler.  Pulmonic Valve: The pulmonic valve was normal in structure. Pulmonic valve regurgitation was not assessed by color flow Doppler.  Aorta: The aorta is normal unless otherwise noted.  Venous: The inferior vena cava is normal in size with greater than 50% respiratory variability.    +--------------+--------++  LEFT VENTRICLE            +----------------+---------++ +--------------+--------++  Diastology                    PLAX 2D                   +----------------+---------++ +--------------+--------++  LV e' lateral:   8.38 cm/s    LVIDd:         4.60 cm    +----------------+---------++ +--------------+--------++  LV E/e' lateral: 11.0         LVIDs:         2.40 cm    +----------------+---------++ +--------------+--------++  LV e' medial:    9.57 cm/s    LV PW:         1.20 cm    +----------------+---------++ +--------------+--------++  LV E/e' medial:  9.6          LV IVS:        1.10 cm    +----------------+---------++ +--------------+--------++  LVOT diam:     1.90 cm    +--------------+--------++  LV SV:         77 ml       +--------------+--------++  LV SV Index:   40.84      +--------------+--------++  LVOT Area:     2.84 cm   +--------------+--------++                            +--------------+--------++  +---------------+----------++  RIGHT VENTRICLE              +---------------+----------++  RV Basal diam:  2.80 cm      +---------------+----------++  RV S prime:     19.00 cm/s   +---------------+----------++  TAPSE (M-mode): 2.9 cm       +---------------+----------++  +---------------+-------++-----------++  LEFT ATRIUM              Index         +---------------+-------++-----------++  LA diam:        3.60 cm  1.97 cm/m    +---------------+-------++-----------++  LA Vol (A2C):   33.0 ml  18.07 ml/m   +---------------+-------++-----------++  LA Vol (A4C):   43.5 ml  23.83 ml/m   +---------------+-------++-----------++  LA Biplane  Vol: 38.4 ml  21.03 ml/m   +---------------+-------++-----------++  +------------+-----------++  AORTIC VALVE               +------------+-----------++  LVOT Vmax:   131.00 cm/s   +------------+-----------++  LVOT Vmean:  85.400 cm/s   +------------+-----------++  LVOT VTI:    0.264 m       +------------+-----------++   +-------------+-------++  AORTA                   +-------------+-------++  Ao Root diam: 2.80 cm   +-------------+-------++  +--------------+----------++  MITRAL VALVE                 +--------------+-------+ +--------------+----------++   SHUNTS                   MV Area (PHT): 10.25 cm     +--------------+-------+ +--------------+----------++   Systemic VTI:  0.26 m    MV PHT:        21.46 msec    +--------------+-------+ +--------------+----------++   Systemic Diam: 1.90 cm   MV Decel Time: 74 msec       +--------------+-------+ +--------------+----------++ +--------------+-----------++  MV E velocity: 91.80 cm/s    +--------------+-----------++  MV A velocity: 130.00 cm/s   +--------------+-----------++  MV E/A  ratio:  0.71          +--------------+-----------++   Antimicrobials:  Anti-infectives (From admission, onward)   Start     Dose/Rate Route Frequency Ordered Stop   02/11/19 2200  vancomycin (VANCOCIN) 1,250 mg in sodium chloride 0.9 % 250 mL IVPB     1,250 mg 166.7 mL/hr over 90 Minutes Intravenous Every 24 hours 02/11/19 0539     02/11/19 1800  ceFEPIme (MAXIPIME) 2 g in sodium chloride 0.9 % 100 mL IVPB  Status:  Discontinued     2 g 200 mL/hr over 30 Minutes Intravenous Every 12 hours 02/11/19 0454 02/11/19 1212   02/11/19 1400  ceFEPIme (MAXIPIME) 2 g in sodium chloride 0.9 % 100 mL IVPB  Status:  Discontinued     2 g 200 mL/hr over 30 Minutes Intravenous Every 8 hours 02/11/19 1212 02/12/19 1151   02/11/19 0500  metroNIDAZOLE (FLAGYL) IVPB 500 mg  Status:  Discontinued     500 mg 100 mL/hr over 60 Minutes Intravenous Every 8 hours 02/11/19 0442 02/12/19 1151   02/11/19 0500  vancomycin (VANCOCIN) 1,250 mg in sodium chloride 0.9 % 250 mL IVPB     1,250 mg 166.7 mL/hr over 90 Minutes Intravenous  Once 02/11/19 0454 02/11/19 0759   02/11/19 0400  ceFEPIme (MAXIPIME) 2 g in sodium chloride 0.9 % 100 mL IVPB     2 g 200 mL/hr over 30 Minutes Intravenous  Once 02/11/19 0354 02/11/19 0443     Subjective: Seen and examined at bedside and states that she feels okay but still feels weak and tired.  No chest pain, lightheadedness or dizziness.  Denied burning discomfort in her urine denies any shortness of breath.  Elective for Port-A-Cath removal given that this is her second bacteremia.  I spoke with Dr. Megan Salon and he will be continuing IV vancomycin for now pending further culture results.  Objective: Vitals:   02/13/19 0840 02/13/19 0916 02/13/19 1059 02/13/19 1303  BP: 135/67 133/70 125/68 122/70  Pulse: 92 91 83 90  Resp: 20 18 18 20   Temp: 99 F (37.2 C) 98.9 F (37.2 C) 98.5 F (36.9 C) 98.4 F (36.9 C)  TempSrc: Oral Oral Oral  Oral  SpO2: 94% 94% 96% 97%  Weight:        Height:        Intake/Output Summary (Last 24 hours) at 02/13/2019 1438 Last data filed at 02/13/2019 1300 Gross per 24 hour  Intake 1061.2 ml  Output 2050 ml  Net -988.8 ml   Filed Weights   02/10/19 2112  Weight: 77.1 kg   Examination: Physical Exam:  Constitutional: WN/WD overweight Caucasian female currently in NAD and appears calm but does appear fatigued Eyes: Lids and conjunctivae normal, sclerae anicteric  ENMT: External Ears, Nose appear normal. Grossly normal hearing. Mucous membranes are moist.  Neck: Appears normal, supple, no cervical masses, normal ROM, no appreciable thyromegaly; no JVD Respiratory: Diminished to auscultation bilaterally, no wheezing, rales, rhonchi or crackles. Normal respiratory effort and patient is not tachypenic. No accessory muscle use. Unlabored breathing  Chest Wall: Has a Port-A-Cath in place on the Right Side Cardiovascular: RRR, no murmurs / rubs / gallops. S1 and S2 auscultated. Trace extremity edema.  Abdomen: Soft, non-tender, Mildly distended. Bowel sounds positive x4.  GU: Deferred. Musculoskeletal: No clubbing / cyanosis of digits/nails. No joint deformity upper and lower extremities.  Skin: No rashes, lesions, ulcers on a limited skin evaluation. No induration; Warm and dry.  Neurologic: CN 2-12 grossly intact with no focal deficits. Romberg sign and cerebellar reflexes not assessed.  Psychiatric: Normal judgment and insight. Alert and oriented x 3. Normal mood and appropriate affect.    Data Reviewed: I have personally reviewed following labs and imaging studies  CBC: Recent Labs  Lab 02/10/19 2116 02/11/19 0923 02/11/19 1413 02/12/19 0532 02/13/19 0529  WBC 7.3 6.1 8.0 9.1 7.3  NEUTROABS 5.4 4.5 6.7 6.7 4.6  HGB 8.9* 7.1* 7.4* 7.5* 7.3*  HCT 26.7* 21.6* 22.9* 22.4* 22.0*  MCV 99.6 102.9* 102.7* 102.8* 101.9*  PLT 295 206 218 210 [id number]   Basic Metabolic Panel: Recent Labs  Lab 02/10/19 2116 02/11/19 0923  02/11/19 1413 02/12/19 0532 02/13/19 0529  NA 133* 137 134* 132* 136  K 4.0 3.6 3.5 3.5 3.5  CL 98 111 103 103 105  CO2 23 21* 21* 20* 23  GLUCOSE 116* 99 127* 124* 93  BUN 13 11 11 11 11   CREATININE 0.91 0.77 0.81 0.82 0.72  CALCIUM 9.4 7.6* 8.2* 7.6* 8.3*  MG  --  1.7  --  1.6* 2.0  PHOS  --  4.0  --  2.9 3.0   GFR: Estimated Creatinine Clearance: 62 mL/min (by C-G formula based on SCr of 0.72 mg/dL). Liver Function Tests: Recent Labs  Lab 02/10/19 2116 02/11/19 0923 02/12/19 0532 02/13/19 0529  AST 33 21 22 19   ALT 27 19 19 16   ALKPHOS 121 87 79 72  BILITOT 0.8 0.6 0.8 0.5  PROT 7.3 5.6* 5.7* 5.6*  ALBUMIN 4.3 3.1* 3.0* 2.8*   No results for input(s): LIPASE, AMYLASE in the last 168 hours. No results for input(s): AMMONIA in the last 168 hours. Coagulation Profile: Recent Labs  Lab 02/10/19 2116  INR 1.1   Cardiac Enzymes: No results for input(s): CKTOTAL, CKMB, CKMBINDEX, TROPONINI in the last 168 hours. BNP (last 3 results) No results for input(s): PROBNP in the last 8760 hours. HbA1C: No results for input(s): HGBA1C in the last 72 hours. CBG: No results for input(s): GLUCAP in the last 168 hours. Lipid Profile: No results for input(s): CHOL, HDL, LDLCALC, TRIG, CHOLHDL, LDLDIRECT in the last 72 hours. Thyroid Function Tests: No results for input(s):  TSH, T4TOTAL, FREET4, T3FREE, THYROIDAB in the last 72 hours. Anemia Panel: Recent Labs    02/13/19 0529  VITAMINB12 1,447*  FOLATE 21.3  FERRITIN 261  TIBC 195*  IRON 24*  RETICCTPCT 2.6   Sepsis Labs: Recent Labs  Lab 02/10/19 2116  LATICACIDVEN 1.2    Recent Results (from the past 240 hour(s))  Culture, blood (Routine x 2)     Status: None (Preliminary result)   Collection Time: 02/10/19  9:16 PM   Specimen: BLOOD  Result Value Ref Range Status   Specimen Description   Final    BLOOD RIGHT WRIST Performed at [facility] [address] [place], [place] [address]     Special Requests   Final    BOTTLES DRAWN AEROBIC AND ANAEROBIC Blood Culture adequate volume Performed at [facility] [address], [place], [place] [address]    Culture  Setup Time   Final    GRAM POSITIVE COCCI AEROBIC BOTTLE ONLY CRITICAL RESULT CALLED TO, READ BACK BY AND VERIFIED WITH: DR CAMPBELL [id number] AT 1157 AM BY CM    Culture   Final    GRAM POSITIVE COCCI IDENTIFICATION AND SUSCEPTIBILITIES TO FOLLOW Performed at [facility], [place] [address], [place], [place] [address]    Report Status PENDING  Incomplete  Culture, blood (Routine x 2)     Status: None (Preliminary result)   Collection Time: 02/10/19  9:21 PM   Specimen: BLOOD  Result Value Ref Range Status   Specimen Description   Final    BLOOD BLOOD RIGHT HAND Performed at [facility] [address] [place], [place] [address]    Special Requests   Final    BOTTLES DRAWN AEROBIC AND ANAEROBIC Blood Culture adequate volume Performed at [facility] [address], [place], [place] [address]    Culture  Setup Time   Final    GRAM POSITIVE COCCI IN BOTH AEROBIC AND ANAEROBIC BOTTLES CRITICAL RESULT CALLED TO, READ BACK BY AND VERIFIED WITH: Sheffield Slider PharmD 17:00 02/11/19 ([email or web address])    Culture   Final    GRAM POSITIVE COCCI CULTURE REINCUBATED FOR BETTER GROWTH Performed at [facility], [place] [address] [place],  [address]    Report Status PENDING  Incomplete  Blood Culture ID Panel (Reflexed)     Status: Abnormal   Collection Time: 02/10/19  9:21 PM  Result Value Ref Range Status   Enterococcus species NOT DETECTED NOT DETECTED Final   Listeria monocytogenes NOT DETECTED NOT DETECTED Final   Staphylococcus species DETECTED (A) NOT DETECTED Final    Comment: Methicillin (oxacillin) susceptible coagulase negative staphylococcus. Possible blood culture contaminant (unless isolated from more than one blood culture draw or clinical  case suggests pathogenicity). No antibiotic treatment is indicated for blood  culture contaminants. CRITICAL RESULT CALLED TO, READ BACK BY AND VERIFIED WITH: Sheffield Slider PharmD 17:00 02/11/19 ([email or web address])    Staphylococcus aureus (BCID) NOT DETECTED NOT DETECTED Final   Methicillin resistance NOT DETECTED NOT DETECTED Final   Streptococcus species NOT DETECTED NOT DETECTED Final   Streptococcus agalactiae NOT DETECTED NOT DETECTED Final   Streptococcus pneumoniae NOT DETECTED NOT DETECTED Final   Streptococcus pyogenes NOT DETECTED NOT DETECTED Final   Acinetobacter baumannii NOT DETECTED NOT DETECTED Final   Enterobacteriaceae species NOT DETECTED NOT DETECTED Final   Enterobacter cloacae complex NOT DETECTED NOT DETECTED Final   Escherichia coli NOT DETECTED NOT DETECTED Final  Klebsiella oxytoca NOT DETECTED NOT DETECTED Final   Klebsiella pneumoniae NOT DETECTED NOT DETECTED Final   Proteus species NOT DETECTED NOT DETECTED Final   Serratia marcescens NOT DETECTED NOT DETECTED Final   Haemophilus influenzae NOT DETECTED NOT DETECTED Final   Neisseria meningitidis NOT DETECTED NOT DETECTED Final   Pseudomonas aeruginosa NOT DETECTED NOT DETECTED Final   Candida albicans NOT DETECTED NOT DETECTED Final   Candida glabrata NOT DETECTED NOT DETECTED Final   Candida krusei NOT DETECTED NOT DETECTED Final   Candida parapsilosis NOT DETECTED NOT DETECTED Final   Candida tropicalis NOT DETECTED NOT DETECTED Final    Comment: Performed at [facility], [place] [address], [place], [place] [address]  SARS Coronavirus 2 [facility] order, Performed in [facility] hospital lab) Nasopharyngeal Nasopharyngeal Swab     Status: None   Collection Time: 02/10/19 11:23 PM   Specimen: Nasopharyngeal Swab  Result Value Ref Range Status   SARS Coronavirus 2 NEGATIVE NEGATIVE Final    Comment: (NOTE) If result is NEGATIVE SARS-CoV-2 target nucleic acids are NOT DETECTED. The SARS-CoV-2 RNA is generally  detectable in upper and lower  respiratory specimens during the acute phase of infection. The lowest  concentration of SARS-CoV-2 viral copies this assay can detect is 250  copies / mL. A negative result does not preclude SARS-CoV-2 infection  and should not be used as the sole basis for treatment or other  patient management decisions.  A negative result may occur with  improper specimen collection / handling, submission of specimen other  than nasopharyngeal swab, presence of viral mutation(s) within the  areas targeted by this assay, and inadequate number of viral copies  (<250 copies / mL). A negative result must be combined with clinical  observations, patient history, and epidemiological information. If result is POSITIVE SARS-CoV-2 target nucleic acids are DETECTED. The SARS-CoV-2 RNA is generally detectable in upper and lower  respiratory specimens dur ing the acute phase of infection.  Positive  results are indicative of active infection with SARS-CoV-2.  Clinical  correlation with patient history and other diagnostic information is  necessary to determine patient infection status.  Positive results do  not rule out bacterial infection or co-infection with other viruses. If result is PRESUMPTIVE POSTIVE SARS-CoV-2 nucleic acids MAY BE PRESENT.   A presumptive positive result was obtained on the submitted specimen  and confirmed on repeat testing.  While 2019 novel coronavirus  (SARS-CoV-2) nucleic acids may be present in the submitted sample  additional confirmatory testing may be necessary for epidemiological  and / or clinical management purposes  to differentiate between  SARS-CoV-2 and other Sarbecovirus currently known to infect humans.  If clinically indicated additional testing with an alternate test  methodology [phone]) is advised. The SARS-CoV-2 RNA is generally  detectable in upper and lower respiratory sp ecimens during the acute  phase of infection. The  expected result is Negative. Fact Sheet for Patients:  StrictlyIdeas.no Fact Sheet for Healthcare Providers: BankingDealers.co.za This test is not yet approved or cleared by the [place] FDA and has been authorized for detection and/or diagnosis of SARS-CoV-2 by FDA under an Emergency Use Authorization (EUA).  This EUA will remain in effect (meaning this test can be used) for the duration of the COVID-19 declaration under Section 564(b)(1) of the Act, 21 U.S.C. section 360bbb-3(b)(1), unless the authorization is terminated or revoked sooner. Performed at [facility], [place] [address], [place], [place] [address]   Urine culture  Status: Abnormal   Collection Time: 02/11/19  2:00 AM   Specimen: Urine, Random  Result Value Ref Range Status   Specimen Description   Final    URINE, RANDOM Performed at [facility] [address] [place], [place] [address]    Special Requests   Final    NONE Performed at [facility], [place] [address], [place], [place] [address]    Culture >=100,000 COLONIES/mL ENTEROCOCCUS FAECALIS (A)  Final   Report Status 02/12/2019 FINAL  Final   Organism ID, Bacteria ENTEROCOCCUS FAECALIS (A)  Final      Susceptibility   Enterococcus faecalis - MIC*    AMPICILLIN <=2 SENSITIVE Sensitive     LEVOFLOXACIN 2 SENSITIVE Sensitive     NITROFURANTOIN <=16 SENSITIVE Sensitive     VANCOMYCIN <=0.5 SENSITIVE Sensitive     * >=100,000 COLONIES/mL ENTEROCOCCUS FAECALIS  Culture, sputum-assessment     Status: None   Collection Time: 02/11/19  9:41 AM   Specimen: Sputum  Result Value Ref Range Status   Specimen Description SPUTUM  Final   Special Requests NONE  Final   Sputum evaluation   Final    Sputum specimen not acceptable for testing.  Please recollect.   NOTIFIED Pauline Good [id number] Performed at [facility], [place] [address]  [place], [place] [address]    Report Status 02/11/2019 FINAL  Final  Culture, blood (routine x 2)     Status: None (Preliminary result)   Collection Time: 02/11/19  6:32 PM   Specimen: BLOOD  Result Value Ref Range Status   Specimen Description   Final    BLOOD RIGHT ANTECUBITAL Performed at [facility] [address] [place], [place] [address]    Special Requests   Final    BOTTLES DRAWN AEROBIC ONLY Blood Culture adequate volume Performed at [facility] [address], [place], [place] [address]    Culture   Final    NO GROWTH 2 DAYS Performed at [facility] [address] [place], [place] [address]    Report Status PENDING  Incomplete  Culture, blood (routine x 2)     Status: None (Preliminary result)   Collection Time: 02/11/19  6:37 PM   Specimen: BLOOD  Result Value Ref Range Status   Specimen Description   Final    BLOOD LEFT ANTECUBITAL Performed at [facility] [address] [place], [place] [address]    Special Requests   Final    BOTTLES DRAWN AEROBIC ONLY Blood Culture adequate volume Performed at [facility] [address] [place], [place] [address]    Culture   Final    NO GROWTH 2 DAYS Performed at [facility] [address], [place], [place] [address]    Report Status PENDING  Incomplete    Radiology Studies: No results found. Scheduled Meds:  Chlorhexidine Gluconate Cloth  6 each Topical Daily   [START ON 02/14/2019] enoxaparin (LOVENOX) injection  40 mg Subcutaneous Q24H   gabapentin  300 mg Oral BID   sodium chloride flush  3 mL Intravenous Q12H   Continuous Infusions:  sodium chloride 75 mL/hr at 02/12/19 1340   vancomycin 1,250 mg (02/13/19 0136)    LOS: 2 days   Kerney Elbe, DO [facility] PAGER is on AMION  If 7PM-7AM, please contact night-coverage [email or web address] Password [facility] 02/13/2019, 2:38 PM

## 2019-02-14 DIAGNOSIS — L659 Nonscarring hair loss, unspecified: Secondary | ICD-10-CM

## 2019-02-14 DIAGNOSIS — T80219A Unspecified infection due to central venous catheter, initial encounter: Secondary | ICD-10-CM

## 2019-02-14 DIAGNOSIS — Z923 Personal history of irradiation: Secondary | ICD-10-CM

## 2019-02-14 DIAGNOSIS — D6481 Anemia due to antineoplastic chemotherapy: Secondary | ICD-10-CM

## 2019-02-14 DIAGNOSIS — Z9221 Personal history of antineoplastic chemotherapy: Secondary | ICD-10-CM

## 2019-02-14 LAB — COMPREHENSIVE METABOLIC PANEL WITH GFR
ALT: 19 U/L (ref 0–44)
AST: 25 U/L (ref 15–41)
Albumin: 2.9 g/dL — ABNORMAL LOW (ref 3.5–5.0)
Alkaline Phosphatase: 87 U/L (ref 38–126)
Anion gap: 8 (ref 5–15)
BUN: 9 mg/dL (ref 8–23)
CO2: 24 mmol/L (ref 22–32)
Calcium: 8.4 mg/dL — ABNORMAL LOW (ref 8.9–10.3)
Chloride: 106 mmol/L (ref 98–111)
Creatinine, Ser: 0.64 mg/dL (ref 0.44–1.00)
GFR calc Af Amer: >60 mL/min
GFR calc non Af Amer: >60 mL/min
Glucose, Bld: 99 mg/dL (ref 70–99)
Potassium: 3.4 mmol/L — ABNORMAL LOW (ref 3.5–5.1)
Sodium: 138 mmol/L (ref 135–145)
Total Bilirubin: 0.4 mg/dL (ref 0.3–1.2)
Total Protein: 5.8 g/dL — ABNORMAL LOW (ref 6.5–8.1)

## 2019-02-14 LAB — CULTURE, BLOOD (ROUTINE X 2)
Special Requests: ADEQUATE
Special Requests: ADEQUATE

## 2019-02-14 LAB — CBC WITH DIFFERENTIAL/PLATELET
Abs Immature Granulocytes: 0.04 10*3/uL (ref 0.00–0.07)
Basophils Absolute: 0 10*3/uL (ref 0.0–0.1)
Basophils Relative: 0 %
Eosinophils Absolute: 0.5 10*3/uL (ref 0.0–0.5)
Eosinophils Relative: 9 %
HCT: 25.2 % — ABNORMAL LOW (ref 36.0–46.0)
Hemoglobin: 8.5 g/dL — ABNORMAL LOW (ref 12.0–15.0)
Immature Granulocytes: 1 %
Lymphocytes Relative: 22 %
Lymphs Abs: 1.2 10*3/uL (ref 0.7–4.0)
MCH: 32.9 pg (ref 26.0–34.0)
MCHC: 33.7 g/dL (ref 30.0–36.0)
MCV: 97.7 fL (ref 80.0–100.0)
Monocytes Absolute: 0.6 10*3/uL (ref 0.1–1.0)
Monocytes Relative: 11 %
Neutro Abs: 3.2 10*3/uL (ref 1.7–7.7)
Neutrophils Relative %: 57 %
Platelets: 202 10*3/uL (ref 150–400)
RBC: 2.58 MIL/uL — ABNORMAL LOW (ref 3.87–5.11)
RDW: 16.1 % — ABNORMAL HIGH (ref 11.5–15.5)
WBC: 5.5 10*3/uL (ref 4.0–10.5)
nRBC: 0 % (ref 0.0–0.2)

## 2019-02-14 LAB — PHOSPHORUS: Phosphorus: 3 mg/dL (ref 2.5–4.6)

## 2019-02-14 LAB — MAGNESIUM: Magnesium: 1.7 mg/dL (ref 1.7–2.4)

## 2019-02-14 MED ORDER — GUAIFENESIN-DM 100-10 MG/5ML PO SYRP: 5.0000 mL | ORAL_SOLUTION | ORAL | 0 refills | Status: DC | PRN

## 2019-02-14 MED ORDER — ONDANSETRON HCL 4 MG PO TABS: 4.0000 mg | ORAL_TABLET | Freq: Four times a day (QID) | ORAL | 0 refills | Status: DC | PRN

## 2019-02-14 MED ORDER — POLYETHYLENE GLYCOL 3350 17 G PO PACK: 17.0000 g | PACK | Freq: Every day | ORAL | 0 refills | Status: DC | PRN

## 2019-02-14 MED ORDER — POTASSIUM CHLORIDE CRYS ER 20 MEQ PO TBCR
40.0000 meq | EXTENDED_RELEASE_TABLET | Freq: Two times a day (BID) | ORAL | Status: AC
  Administered 2019-02-14 (×2): 40 meq via ORAL
  Filled 2019-02-14 (×2): qty 2

## 2019-02-14 MED ORDER — CEFTRIAXONE IV (FOR PTA / DISCHARGE USE ONLY): 2.0000 g | INTRAVENOUS | 0 refills | Status: DC

## 2019-02-14 MED ORDER — SODIUM CHLORIDE 0.9 % IV SOLN
2.0000 g | INTRAVENOUS | Status: DC
  Administered 2019-02-14 – 2019-02-16 (×3): 2 g via INTRAVENOUS
  Filled 2019-02-14: qty 2
  Filled 2019-02-14 (×2): qty 20
  Filled 2019-02-14: qty 2
  Filled 2019-02-14: qty 20

## 2019-02-14 MED ORDER — MAGNESIUM SULFATE 2 GM/50ML IV SOLN
2.0000 g | Freq: Once | INTRAVENOUS | Status: AC
  Administered 2019-02-14: 2 g via INTRAVENOUS
  Filled 2019-02-14: qty 50

## 2019-02-14 NOTE — Progress Notes (Signed)
PHARMACY CONSULT NOTE FOR:  OUTPATIENT  PARENTERAL ANTIBIOTIC THERAPY (OPAT)  Indication: Bacteremia Regimen: Ceftriaxone 2 g IV daily End date: 02/27/19  IV antibiotic discharge orders are pended. To discharging provider:  please sign these orders via discharge navigator,  Select New Orders & click on the button choice - Manage This Unsigned Work.     Thank you for allowing pharmacy to be a part of this patient's care.  Lenis Noon, PharmD 02/14/2019, 4:22 PM

## 2019-02-14 NOTE — TOC Initial Note (Signed)
Transition of Care [facility]) - Initial/Assessment Note    Patient Details  Name: Zoe Kim MRN: [id number] Date of Birth: 1945/09/10  [facility]) CM/SW Contact:    Joaquin Courts, RN Phone Number: 02/14/2019, 4:42 PM  Clinical Narrative:  Referral given to advance home infusion for [facility] IV antibiotics.                  Expected Discharge Plan: [place] Barriers to Discharge: Continued Medical Work up   Patient Goals and CMS Choice Patient states their goals for this hospitalization and ongoing recovery are:: to go home      Expected Discharge Plan and Services Expected Discharge Plan: [place]   Discharge Planning Services: CM Consult Post Acute Care Choice: [place] arrangements for the past 2 months: Single Family Home Expected Discharge Date: (unknown)               DME Arranged: N/A DME Agency: NA       HH Arranged: IV Antibiotics HH Agency: Other - See comment(advance home infusion) Date HH Agency Contacted: 02/14/19 Time HH Agency Contacted: 1641 Representative spoke with at [place]: [place]  Prior Living Arrangements/Services Living arrangements for the past 2 months: [place] Lives with:: Spouse Patient language and need for interpreter reviewed:: Yes Do you feel safe going back to the place where you live?: Yes      Need for Family Participation in Patient Care: Yes (Comment) Care giver support system in place?: Yes (comment)   Criminal Activity/Legal Involvement Pertinent to Current Situation/Hospitalization: No - Comment as needed  Activities of Daily Living Home Assistive Devices/Equipment: Eyeglasses ADL Screening (condition at time of admission) Patient's cognitive ability adequate to safely complete daily activities?: Yes Is the patient deaf or have difficulty hearing?: No Does the patient have difficulty seeing, even when wearing glasses/contacts?: No Does the  patient have difficulty concentrating, remembering, or making decisions?: No Patient able to express need for assistance with ADLs?: Yes Does the patient have difficulty dressing or bathing?: No Independently performs ADLs?: Yes (appropriate for developmental age) Does the patient have difficulty walking or climbing stairs?: Yes(secondary to weakness) Weakness of Legs: Both Weakness of Arms/Hands: None  Permission Sought/Granted                  Emotional Assessment Appearance:: Appears stated age Attitude/Demeanor/Rapport: Engaged Affect (typically observed): Accepting Orientation: : Oriented to Place, Oriented to  Time, Oriented to Situation, Oriented to Self   Psych Involvement: No (comment)  Admission diagnosis:  Fever, unspecified fever cause [R50.9] Patient Active Problem List   Diagnosis Date Noted  . Urinary incontinence 02/13/2019  . Fever 02/12/2019  . Coagulase negative Staphylococcus bacteremia 02/12/2019  . SIRS (systemic inflammatory response syndrome) ([place]) 02/11/2019  . Tachycardia 01/13/2019  . Neutropenic fever ([place]) 12/30/2018  . Anemia due to antineoplastic chemotherapy 12/24/2018  . Mild depression ([place]) 12/03/2018  . Other constipation 11/20/2018  . Peripheral neuropathy due to chemotherapy ([place]) 11/20/2018  . Family history of breast cancer   . Family history of lung cancer   . Bone pain 11/17/2018  . Obesity 10/14/2018  . Endometrial cancer ([place]) 10/01/2018  . Primary localized osteoarthritis of right knee 07/30/2016  . Anxiety   . Primary localized osteoarthrosis of the knee, right   . Urinary urgency    PCP:  [name], MD Pharmacy:   [facility] [id number] - [place], [place] -  [address] [place] [address] Phone: [phone] Fax: [phone]     Social Determinants of Health (SDOH) Interventions    Readmission Risk Interventions No flowsheet data  found.

## 2019-02-14 NOTE — Evaluation (Signed)
Physical Therapy Evaluation Patient Details Name: Zoe Kim MRN: [id number] DOB: January 03, 1946 Today's Date: 02/14/2019   History of Present Illness  73yo female c/o fever, fatigue, and general weakness. Covid test negative. Admitted for SIRS. PMH anxiety, endometrial CA currently on radiation and chemo, TKR, peripheral neuropathy  Clinical Impression   Noted run of V-tach last evening- RN now reports that patient is stable and safe to participate in PT. 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 Able to complete all functional mobility with Min guard, did require RW for functional transfers and gait approximately 136f due to general weakness and unsteadiness today. Easy to fatigue but very motivated to improve her functional activity tolerance. HR to 130BPM with gait, recovered as expected to resting rate of 96BPM with seated rest. She was left up in the chair with all needs met, all questions/concerns addressed, and chair alarm active. She will continue to benefit from skilled PT services in the acute setting, also recommend skilled HHPT services moving forward.     Follow Up Recommendations Home health PT    Equipment Recommendations  Rolling walker with 5" wheels;3in1 (PT)    Recommendations for Other Services       Precautions / Restrictions Precautions Precautions: Fall Restrictions Weight Bearing Restrictions: No      Mobility  Bed Mobility Overal bed mobility: Needs Assistance Bed Mobility: Supine to Sit     Supine to sit: Min guard     General bed mobility comments: min guard for safety, no physical assist given  Transfers Overall transfer level: Needs assistance Equipment used: Rolling walker (2 wheeled) Transfers: Sit to/from Stand Sit to Stand: Min guard         General transfer comment: min guard for safety, VC for hand  placement no physical assist given  Ambulation/Gait Ambulation/Gait assistance: Min guard Gait Distance (Feet): 160 Feet Assistive device: Rolling walker (2 wheeled) Gait Pattern/deviations: Step-through pattern;Trunk flexed;Trendelenburg Gait velocity: decreased   General Gait Details: easily fatigued with gait but very motivated to push through fatigue; HR to 130BPM with gait but recovered as expected back to resting rate of 96BPM once seated  Stairs            Wheelchair Mobility    Modified Rankin (Stroke Patients Only)       Balance Overall balance assessment: Needs assistance Sitting-balance support: Bilateral upper extremity supported;Feet supported Sitting balance-Leahy Scale: Good       Standing balance-Leahy Scale: Fair Standing balance comment: reliant on external support                             Pertinent Vitals/Pain Pain Assessment: No/denies pain    Home Living Family/patient expects to be discharged to:: Private residence Living Arrangements: Spouse/significant other Available Help at Discharge: Family;Available PRN/intermittently Type of Home: House Home Access: Level entry     Home Layout: One level Home Equipment: None      Prior Function Level of Independence: Independent               Hand Dominance        Extremity/Trunk Assessment   Upper Extremity Assessment Upper Extremity Assessment: Generalized weakness    Lower Extremity Assessment Lower Extremity Assessment: Generalized weakness    Cervical / Trunk Assessment Cervical / Trunk Assessment: Normal  Communication   Communication: No  difficulties  Cognition Arousal/Alertness: Awake/alert Behavior During Therapy: WFL for tasks assessed/performed Overall Cognitive Status: Within Functional Limits for tasks assessed                                        General Comments      Exercises     Assessment/Plan    PT Assessment  Patient needs continued PT services  PT Problem List Decreased strength;Decreased mobility;Decreased safety awareness;Decreased coordination;Decreased activity tolerance;Decreased balance       PT Treatment Interventions DME instruction;Therapeutic activities;Gait training;Therapeutic exercise;Patient/family education;Stair training;Balance training;Functional mobility training;Neuromuscular re-education    PT Goals (Current goals can be found in the Care Plan section)  Acute Rehab PT Goals Patient Stated Goal: go home, work back up to feeding animals and riding PT Goal Formulation: With patient Time For Goal Achievement: 02/28/19 Potential to Achieve Goals: Good    Frequency Min 3X/week   Barriers to discharge        Co-evaluation               AM-PAC PT "6 Clicks" Mobility  Outcome Measure Help needed turning from your back to your side while in a flat bed without using bedrails?: None Help needed moving from lying on your back to sitting on the side of a flat bed without using bedrails?: A Little Help needed moving to and from a bed to a chair (including a wheelchair)?: A Little Help needed standing up from a chair using your arms (e.g., wheelchair or bedside chair)?: A Little Help needed to walk in hospital room?: A Little Help needed climbing 3-5 steps with a railing? : A Little 6 Click Score: 19    End of Session Equipment Utilized During Treatment: Gait belt Activity Tolerance: Patient tolerated treatment well Patient left: in chair;with call bell/phone within reach;with chair alarm set Nurse Communication: Mobility status PT Visit Diagnosis: Muscle weakness (generalized) (M62.81);Unsteadiness on feet (R26.81)    Time: 0935-1010 PT Time Calculation (min) (ACUTE ONLY): 35 min   Charges:   PT Evaluation $PT Eval Moderate Complexity: 1 Mod PT Treatments $Gait Training: 8-22 mins        Deniece Ree PT, DPT, CBIS  Supplemental Physical Therapist [place]    Pager [phone] Acute Rehab Office [phone]

## 2019-02-14 NOTE — Evaluation (Signed)
Occupational Therapy Evaluation Patient Details Name: Zoe Kim MRN: [id number] DOB: [birth year] Today's Date: 02/14/2019    History of Present Illness 73yo female c/o fever, fatigue, and general weakness. Covid test negative. Admitted for SIRS. PMH anxiety, endometrial CA currently on radiation and chemo, TKR, peripheral neuropathy   Clinical Impression   Pt admitted with weakness. Pt currently with functional limitations due to the deficits listed below (see OT Problem List).  Pt will benefit from skilled OT to increase their safety and independence with ADL and functional mobility for ADL to facilitate discharge to venue listed below.   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  Pt is very motivated to work with OT and get stronger     Follow Up Recommendations  Home health OT;Supervision/Assistance - 24 hour    Equipment Recommendations  None recommended by OT    Recommendations for Other Services       Precautions / Restrictions Precautions Precautions: Fall Restrictions Weight Bearing Restrictions: No      Mobility Bed Mobility Overal bed mobility: Needs Assistance Bed Mobility: Supine to Sit     Supine to sit: Min guard     General bed mobility comments: min guard for safety, no physical assist given  Transfers Overall transfer level: Needs assistance Equipment used: Rolling walker (2 wheeled) Transfers: Sit to/from [facility] Sit to Stand: Min assist Stand pivot transfers: Min assist            Balance Overall balance assessment: Needs assistance Sitting-balance support: Bilateral upper extremity supported;Feet supported Sitting balance-Leahy Scale: Good       Standing balance-Leahy Scale: Fair Standing balance comment: reliant on external support                           ADL either performed or assessed with clinical judgement   ADL Overall ADL's : Needs  assistance/impaired Eating/Feeding: Set up;Sitting   Grooming: Set up;Sitting   Upper Body Bathing: Set up;Sitting   Lower Body Bathing: Moderate assistance;Sit to/from stand;Cueing for safety;Cueing for sequencing;Cueing for compensatory techniques   Upper Body Dressing : Set up;Sitting   Lower Body Dressing: Moderate assistance;Cueing for safety;Sit to/from stand   Toilet Transfer: Minimal assistance;Ambulation;RW;Comfort height toilet;Regular Toilet;Cueing for sequencing;Cueing for safety   Toileting- Clothing Manipulation and Hygiene: Minimal assistance;Sit to/from stand;Cueing for safety;Cueing for sequencing       Functional mobility during ADLs: Minimal assistance;Cueing for safety;Cueing for sequencing;Rolling walker General ADL Comments: pt so pleasant and motivted to get well! 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                  Pertinent Vitals/Pain Pain Assessment: No/denies pain        Extremity/Trunk Assessment         Cervical / Trunk Assessment Cervical / Trunk Assessment: Normal   Communication Communication Communication: No difficulties   Cognition Arousal/Alertness: Awake/alert Behavior During Therapy: WFL for tasks assessed/performed Overall Cognitive Status: Within Functional Limits for tasks assessed                                                Home Living Family/patient expects to be discharged to:: Private residence Living Arrangements: Spouse/significant other Available Help at Discharge: Family;Available PRN/intermittently Type  of Home: House Home Access: Level entry     Home Layout: One level     Bathroom Shower/Tub: Teacher, early years/pre: Standard Bathroom Accessibility: Yes   Home Equipment: None          Prior Functioning/Environment Level of Independence: Independent                 OT Problem List: Decreased strength;Decreased activity  tolerance;Impaired balance (sitting and/or standing)      OT Treatment/Interventions: Self-care/ADL training;DME and/or AE instruction;Therapeutic activities    OT Goals(Current goals can be found in the care plan section) Acute Rehab OT Goals 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 OT Goal Formulation: With patient Time For Goal Achievement: 02/21/19 ADL Goals Pt Will Perform Grooming: with modified independence;standing Pt Will Perform Lower Body Bathing: with modified independence;sit to/from stand Pt Will Perform Lower Body Dressing: with modified independence;sit to/from stand Pt Will Transfer to Toilet: with modified independence;regular height toilet;ambulating Pt Will Perform Toileting - Clothing Manipulation and hygiene: with modified independence;sit to/from stand Pt Will Perform Tub/Shower Transfer: Shower transfer;with supervision;shower seat  OT Frequency: Min 2X/week    AM-PAC OT "6 Clicks" Daily Activity     Outcome Measure Help from another person eating meals?: None Help from another person taking care of personal grooming?: A Little Help from another person toileting, which includes using toliet, bedpan, or urinal?: A Little Help from another person bathing (including washing, rinsing, drying)?: A Little Help from another person to put on and taking off regular upper body clothing?: None Help from another person to put on and taking off regular lower body clothing?: A Little 6 Click Score: 20   End of Session Equipment Utilized During Treatment: Rolling walker;Gait belt Nurse Communication: Mobility status  Activity Tolerance: Patient tolerated treatment well Patient left: in bed;with call bell/phone within reach  OT Visit Diagnosis: Unsteadiness on feet (R26.81);Muscle weakness (generalized) (M62.81)                Time: [id number] OT Time Calculation (min): 36 min Charges:  OT General Charges $OT Visit: 1 Visit OT Evaluation $OT Eval Moderate  Complexity: 1 Mod OT Treatments $Self Care/Home Management : 8-22 mins  Kari Baars, OT Acute Rehabilitation Services [phone] Office- [phone], Edwena Felty D 02/14/2019, 6:05 PM

## 2019-02-14 NOTE — Progress Notes (Signed)
PROGRESS NOTE    Zoe Kim  [id number] DOB: 10/15/1945 DOA: 02/10/2019 PCP: [name], MD   Brief Narrative:  Patient is a 73 year old female with a past medical history significant for but not limited to endometrial cancer currently undergoing treatment with radiation and chemotherapy which is complicated by peripheral neuropathy and anemia who presented to the ED with a day of fevers, chills, fatigue and generalized weakness.  She reports that she woke up on the morning of 02/10/2019 with fevers chills and malaise and intermittently over the last month or so she has been having similar symptoms but had been worsening recently.  Denied any sick contacts.  In the ED she was found to be febrile and saturating on room air with tachycardia and a stable blood pressure. COVID Testing was negative.  Urinalysis showed some rare bacteria, moderate leukocytes, 11-20 WBCs per high-power field.    In the ED she was given normal saline 1.5 L boluses and started on broad-spectrum antibiotics with IV vancomycin IV cefepime.  Further work-up reveals a MSSA Gram Postive Cocci Bacteremia of Staph Capitis and urine is growing out greater than 100,000 colony-forming units of Enterococcus faecalis.  Infectious disease was consulted for further evaluation recommendations and an echocardiogram has been ordered for her bacteremia.  Infectious Diseases was consulted to adjust her antibiotic regimen currently and further work-up has been pursued and blood cultures were repeated which are showing NGTD at 3 Days.  Likely source of her infection is her Port-A-Cath and patient has elected for Port-A-Cath to be removed even though ID states that she can keep it.  I spoke with Dr. Prince Rome today and he recommends discontinuing vancomycin given the pan sensitivity for her Staph Capitis infection and changing to IV ceftriaxone.  Interventional radiology was consulted for Port-A-Cath removal and since this is the  patient's second bacteremia since July.  She is typed and screened and transfused 1 unit of PRBCs given her anemia.  She is improving daily and can likely be discharged home once her OPAT are in place along with equipment and this may be Monday now.  Assessment & Plan:   Principal Problem:   Fever Active Problems:   Endometrial cancer ([place])   Peripheral neuropathy due to chemotherapy ([facility])   Anemia due to antineoplastic chemotherapy   Coagulase negative Staphylococcus bacteremia   Urinary incontinence   Sepsis 2/2 Staph Capitus Bacteremia in the setting of infected Port-A-Cath, ruled in and poA -Presents with one day of fever and fatigue, and is found to be febrile and tachycardic in ED reasurringly normal lactate, clear CXR,negative COVID-19 test, nourinary sxs, noabd pain or N/V/D,nomeningismus, noapparent cellulitis,no erythema or tenderness about theport in right chest that is functioning appropriately -Blood and urine cultures collected in ED and grew out MSSA Staph Species Staph Capitus which is Pan-Sensitive and Urine is growing out >100,000 CFU of Enterococcus which was pan-sensitive but likely a colonizatgion; repeat blood cultures on 02/11/2019 showed no growth to date at 3 days -She was given 1.5 liters of NS as boluses, and broad spectrum antibiotics initiatedwith IV Vancomycin, Cefepime and Flagyl; Will defer Abx Management to ID as I have consulted Dr. Megan Salon and he recommends continue IV vancomycin for now until further blood cultures are out and he has discontinued IV Cefepime and Flagyl; spoke with Dr. Prince Rome of infectious disease today who recommends stopping IV vancomycin and starting IV ceftriaxone 2 g daily for the next 13 days for completion course on 02/27/2019 -OPAT Orders to  be placed -IVF was resumed at 75 mL/hr and will now stop -Continue empiric antibiotics while following cultures and clinical course -Have ordered an echocardiogram because of her  suspected bacteremia and showed no vegetations or evidence of endocarditis -Repeated blood cultures 02/11/2019 showed no growth to date at 2 days -Continue to monitor temperature curve and cultures; patient is still spiking temperatures and had a T-max of 102 and sepsis physiology is improving but she continues to spike temperatures -Dr. Alvy Bimler recommending Port-A-Cath extraction and has consulted interventional radiology for Port-A-Cath removal. -We will obtain PT and OT to evaluate and recommending Home Health PT -CaseWorker consulted for Assistance with OPAT and arranging Home Health  Enterococcus urinary tract colonization, present on admission -As above -Urinalysis grew out straw-colored urine, moderate leukocytes, rare bacteria, 11-20 WBCs, urine culture grew out greater than 100,000 colony-forming units of enterococcus faecalis which is pansensitive -ID was consulted and will defer antibiotic management to them as we have initiated broad-spectrum antibiotics on admission and this was continued.  And she will be continued on IV vancomycin pending further cultures  -Dr. Megan Salon feels that her urine may be colonized with enterococcus as she is showing no symptoms she was however having some urinary frequency and incontinence earlier prior to admission but I would agree with Dr. Hale Bogus assessment along with Dr. Prince Rome assessment that this is likely a colonization  Endometrial cancer -Undergoing treatment with chemotherapy and radiationbut her treatment had been placed on hold for over 2 weeks since her recent hospital discharge. -Dr. Alvy Bimler has canceled her next treatment next week pending further treatment this hospitalization -Continue gabapentin for associated neuropathy -We will consult her primary oncologist Dr. Alvy Bimler for further evaluation recommendations  Normocytic/Macrocytic Anemia -Hgb is 8.9 on admission, stable and secondary to cancer treatment  -  Hemoglobin/hematocrit now dropped to 7.3/22.0 so Medical oncology is typing and screening transfusing 1 unit PRBCs for this patient -Repeat Hgb/Hct was 8.9/25.6 and is now 8.5/25.2 - Continue to monitor for signs and symptoms of bleeding; currently no overt bleeding noted - Checked anemia panel this AM and showed an iron level of 24, U IBC 171, TIBC 195, saturation ratios of 12%, ferritin level of 261, folate level 21.3, vitamin B12 1447 - Repeat CBC in a.m.  Metabolic Acidosis -Patient's CO2 is now 24and anion gap is now 9 -Continue monitor trend continue with IV fluid hydration as above 75 mL per hour -Repeat CMP in a.m.  Hyponatremia, improved -Patient sodium on admission is 133 -Had initially improved to 137 but then trended back down to 132 and today is 136 -Will now start on IVF with NS at 75 mL/hr -Continue to Monitor trend and repeat CMP in a.m.  Hypomagnesemia, improved -Patient's Mag Level this AM was 1.7 -Replete with IV mag sulfate 2 g  -Continue to monitor and replete as necessary -Repeat magnesium level in a.m.  Hypokalemia -Patient's K+ was 3.4 -Replete with po KCL 40 mEQ BID x2 doses -Continue to Monitor and Replete as Necessary -Repeat CMP in AM   Hyperglycemia -Likely reactive in the setting of infection however will need to rule out diabetes mellitus and obtain a hemoglobin A1c in the a.m. -Blood sugars on daily BMPs/CMP's have been ranging from 93-127 -Continue to monitor blood sugars carefully and if necessary will need to place on a sensitive NovoLog/scale insulin AC  DVT prophylaxis: Enoxaparin 40 mg subcu every 24h Code Status: FULL CODE Family Communication:  No family present at bedside  Disposition Plan: Home health  PT once patient has opiate in place as well as received her infusion pump and have case manager check to see how much the IV ceftriaxone will be from her insurance  Consultants:   Medical Oncology  Infectious  Diseases  Interventional radiology   Procedures:  ECHOCARDIOGRAM  IMPRESSIONS    1. The left ventricle has hyperdynamic systolic function, with an ejection fraction of >65%. The cavity size was normal. There is mild concentric left ventricular hypertrophy. Left ventricular diastolic Doppler parameters are consistent with impaired  relaxation.  2. The right ventricle has normal systolic function. The cavity was normal. There is no increase in right ventricular wall thickness. Right ventricular systolic pressure is normal.  3. The aorta is normal unless otherwise noted.  SUMMARY   No vegatation was seen.  FINDINGS  Left Ventricle: The left ventricle has hyperdynamic systolic function, with an ejection fraction of >65%. The cavity size was normal. There is mild concentric left ventricular hypertrophy. Left ventricular diastolic Doppler parameters are consistent  with impaired relaxation. Normal left ventricular filling pressures  Right Ventricle: The right ventricle has normal systolic function. The cavity was normal. There is no increase in right ventricular wall thickness. Right ventricular systolic pressure is normal.  Left Atrium: Left atrial size was normal in size.  Right Atrium: Right atrial size was normal in size.  Interatrial Septum: No atrial level shunt detected by color flow Doppler.  Pericardium: There is no evidence of pericardial effusion.  Mitral Valve: The mitral valve is normal in structure. Mitral valve regurgitation is not visualized by color flow Doppler.  Tricuspid Valve: The tricuspid valve is normal in structure. Tricuspid valve regurgitation was not visualized by color flow Doppler.  Aortic Valve: The aortic valve is normal in structure. Aortic valve regurgitation was not visualized by color flow Doppler.  Pulmonic Valve: The pulmonic valve was normal in structure. Pulmonic valve regurgitation was not assessed by color flow Doppler.  Aorta:  The aorta is normal unless otherwise noted.  Venous: The inferior vena cava is normal in size with greater than 50% respiratory variability.    +--------------+--------++  LEFT VENTRICLE            +----------------+---------++ +--------------+--------++  Diastology                    PLAX 2D                   +----------------+---------++ +--------------+--------++  LV e' lateral:   8.38 cm/s    LVIDd:         4.60 cm    +----------------+---------++ +--------------+--------++  LV E/e' lateral: 11.0         LVIDs:         2.40 cm    +----------------+---------++ +--------------+--------++  LV e' medial:    9.57 cm/s    LV PW:         1.20 cm    +----------------+---------++ +--------------+--------++  LV E/e' medial:  9.6          LV IVS:        1.10 cm    +----------------+---------++ +--------------+--------++  LVOT diam:     1.90 cm    +--------------+--------++  LV SV:         77 ml      +--------------+--------++  LV SV Index:   40.84      +--------------+--------++  LVOT Area:     2.84 cm   +--------------+--------++                            +--------------+--------++  +---------------+----------++  RIGHT VENTRICLE              +---------------+----------++  RV Basal diam:  2.80 cm      +---------------+----------++  RV S prime:     19.00 cm/s   +---------------+----------++  TAPSE (M-mode): 2.9 cm       +---------------+----------++  +---------------+-------++-----------++  LEFT ATRIUM              Index         +---------------+-------++-----------++  LA diam:        3.60 cm  1.97 cm/m    +---------------+-------++-----------++  LA Vol (A2C):   33.0 ml  18.07 ml/m   +---------------+-------++-----------++  LA Vol (A4C):   43.5 ml  23.83 ml/m   +---------------+-------++-----------++  LA Biplane Vol: 38.4 ml  21.03 ml/m   +---------------+-------++-----------++  +------------+-----------++  AORTIC VALVE                +------------+-----------++  LVOT Vmax:   131.00 cm/s   +------------+-----------++  LVOT Vmean:  85.400 cm/s   +------------+-----------++  LVOT VTI:    0.264 m       +------------+-----------++   +-------------+-------++  AORTA                   +-------------+-------++  Ao Root diam: 2.80 cm   +-------------+-------++  +--------------+----------++  MITRAL VALVE                 +--------------+-------+ +--------------+----------++   SHUNTS                   MV Area (PHT): 10.25 cm     +--------------+-------+ +--------------+----------++   Systemic VTI:  0.26 m    MV PHT:        21.46 msec    +--------------+-------+ +--------------+----------++   Systemic Diam: 1.90 cm   MV Decel Time: 74 msec       +--------------+-------+ +--------------+----------++ +--------------+-----------++  MV E velocity: 91.80 cm/s    +--------------+-----------++  MV A velocity: 130.00 cm/s   +--------------+-----------++  MV E/A ratio:  0.71          +--------------+-----------++   Antimicrobials:  Anti-infectives (From admission, onward)   Start     Dose/Rate Route Frequency Ordered Stop   02/14/19 1400  cefTRIAXone (ROCEPHIN) 2 g in sodium chloride 0.9 % 100 mL IVPB     2 g 200 mL/hr over 30 Minutes Intravenous Every 24 hours 02/14/19 1334     02/14/19 0000  cefTRIAXone (ROCEPHIN) IVPB     2 g Intravenous Every 24 hours 02/14/19 1352 02/27/19 2359   02/11/19 2200  vancomycin (VANCOCIN) 1,250 mg in sodium chloride 0.9 % 250 mL IVPB  Status:  Discontinued     1,250 mg 166.7 mL/hr over 90 Minutes Intravenous Every 24 hours 02/11/19 0539 02/14/19 1334   02/11/19 1800  ceFEPIme (MAXIPIME) 2 g in sodium chloride 0.9 % 100 mL IVPB  Status:  Discontinued     2 g 200 mL/hr over 30 Minutes Intravenous Every 12 hours 02/11/19 0454 02/11/19 1212   02/11/19 1400  ceFEPIme (MAXIPIME) 2 g in sodium chloride 0.9 % 100 mL IVPB  Status:  Discontinued     2 g 200 mL/hr over 30 Minutes Intravenous  Every 8 hours 02/11/19 1212 02/12/19 1151   02/11/19 0500  metroNIDAZOLE (FLAGYL) IVPB 500 mg  Status:  Discontinued     500 mg 100 mL/hr over 60 Minutes Intravenous Every 8 hours 02/11/19 0442 02/12/19 1151  02/11/19 0500  vancomycin (VANCOCIN) 1,250 mg in sodium chloride 0.9 % 250 mL IVPB     1,250 mg 166.7 mL/hr over 90 Minutes Intravenous  Once 02/11/19 0454 02/11/19 0759   02/11/19 0400  ceFEPIme (MAXIPIME) 2 g in sodium chloride 0.9 % 100 mL IVPB     2 g 200 mL/hr over 30 Minutes Intravenous  Once 02/11/19 0354 02/11/19 0443     Subjective: Seen and examined at bedside and states she is feeling better and had her Port-A-Cath removed yesterday and now has a right arm PICC Line.  Daughter will discharge today.  Denies any chest pain, headedness or dizziness.  No nausea or vomiting.  No other concerns complaints at this time.  Objective: Vitals:   02/13/19 1303 02/13/19 1733 02/13/19 2101 02/14/19 0600  BP: 122/70 133/80 140/72 131/80  Pulse: 90 86 96 90  Resp: 20  19 18   Temp: 98.4 F (36.9 C) 98.8 F (37.1 C) 98.2 F (36.8 C) 98.7 F (37.1 C)  TempSrc: Oral Oral Oral Oral  SpO2: 97% 94% 96% 95%  Weight:      Height:        Intake/Output Summary (Last 24 hours) at 02/14/2019 1532 Last data filed at 02/14/2019 0600 Gross per 24 hour  Intake 2124.94 ml  Output 1450 ml  Net 674.94 ml   Filed Weights   02/10/19 2112  Weight: 77.1 kg   Examination: Physical Exam:  Constitutional: WN/WD overweight Caucasian female in NAD and appears calm but a little uncomfortable sitting in the chair at bedside Eyes: Lids and conjunctivae normal, sclerae anicteric  ENMT: External Ears, Nose appear normal. Grossly normal hearing. Mucous membranes are moist.  Neck: Appears normal, supple, no cervical masses, normal ROM, no appreciable thyromegaly; no JVD Respiratory: Diminished to auscultation bilaterally, no wheezing, rales, rhonchi or crackles. Normal respiratory effort and patient is  not tachypenic. No accessory muscle use. Unlabored breathing  Cardiovascular: RRR, no murmurs / rubs / gallops. S1 and S2 auscultated.   Abdomen: Soft, non-tender, Mildly distended. No masses palpated.  Bowel sounds positive x4.  GU: Deferred. Musculoskeletal: No clubbing / cyanosis of digits/nails. No joint deformity upper and lower extremities. Has a Right Arm PICC Skin: No rashes, lesions, ulcers on a limited skin evaluation. No induration; Warm and dry.  Neurologic: CN 2-12 grossly intact with no focal deficits. Romberg sign and cerebellar reflexes not assessed.  Psychiatric: Normal judgment and insight. Alert and oriented x 3. Normal mood and appropriate affect.   Data Reviewed: I have personally reviewed following labs and imaging studies  CBC: Recent Labs  Lab 02/11/19 0923 02/11/19 1413 02/12/19 0532 02/13/19 0529 02/13/19 2042 02/14/19 0401  WBC 6.1 8.0 9.1 7.3  --  5.5  NEUTROABS 4.5 6.7 6.7 4.6  --  3.2  HGB 7.1* 7.4* 7.5* 7.3* 8.9* 8.5*  HCT 21.6* 22.9* 22.4* 22.0* 25.6* 25.2*  MCV 102.9* 102.7* 102.8* 101.9*  --  97.7  PLT 206 218 210 199  --  [id number]   Basic Metabolic Panel: Recent Labs  Lab 02/11/19 0923 02/11/19 1413 02/12/19 0532 02/13/19 0529 02/14/19 0401  NA 137 134* 132* 136 138  K 3.6 3.5 3.5 3.5 3.4*  CL 111 103 103 105 106  CO2 21* 21* 20* 23 24  GLUCOSE 99 127* 124* 93 99  BUN 11 11 11 11 9   CREATININE 0.77 0.81 0.82 0.72 0.64  CALCIUM 7.6* 8.2* 7.6* 8.3* 8.4*  MG 1.7  --  1.6* 2.0 1.7  PHOS 4.0  --  2.9 3.0 3.0   GFR: Estimated Creatinine Clearance: 62 mL/min (by C-G formula based on SCr of 0.64 mg/dL). Liver Function Tests: Recent Labs  Lab 02/10/19 2116 02/11/19 0923 02/12/19 0532 02/13/19 0529 02/14/19 0401  AST 33 21 22 19 25   ALT 27 19 19 16 19   ALKPHOS 121 87 79 72 87  BILITOT 0.8 0.6 0.8 0.5 0.4  PROT 7.3 5.6* 5.7* 5.6* 5.8*  ALBUMIN 4.3 3.1* 3.0* 2.8* 2.9*   No results for input(s): LIPASE, AMYLASE in the last 168 hours. No  results for input(s): AMMONIA in the last 168 hours. Coagulation Profile: Recent Labs  Lab 02/10/19 2116  INR 1.1   Cardiac Enzymes: No results for input(s): CKTOTAL, CKMB, CKMBINDEX, TROPONINI in the last 168 hours. BNP (last 3 results) No results for input(s): PROBNP in the last 8760 hours. HbA1C: No results for input(s): HGBA1C in the last 72 hours. CBG: No results for input(s): GLUCAP in the last 168 hours. Lipid Profile: No results for input(s): CHOL, HDL, LDLCALC, TRIG, CHOLHDL, LDLDIRECT in the last 72 hours. Thyroid Function Tests: No results for input(s): TSH, T4TOTAL, FREET4, T3FREE, THYROIDAB in the last 72 hours. Anemia Panel: Recent Labs    02/13/19 0529  VITAMINB12 1,447*  FOLATE 21.3  FERRITIN 261  TIBC 195*  IRON 24*  RETICCTPCT 2.6   Sepsis Labs: Recent Labs  Lab 02/10/19 2116  LATICACIDVEN 1.2    Recent Results (from the past 240 hour(s))  Culture, blood (Routine x 2)     Status: Abnormal   Collection Time: 02/10/19  9:16 PM   Specimen: BLOOD  Result Value Ref Range Status   Specimen Description   Final    BLOOD RIGHT WRIST Performed at [facility] [address] [place], [place] [address]    Special Requests   Final    BOTTLES DRAWN AEROBIC AND ANAEROBIC Blood Culture adequate volume Performed at [facility] [address] [place], [place] [address]    Culture  Setup Time   Final    GRAM POSITIVE COCCI AEROBIC BOTTLE ONLY CRITICAL RESULT CALLED TO, READ BACK BY AND VERIFIED WITH: DR CAMPBELL [id number] AT 1157 AM BY CM Performed at [facility], [place] [address] [place], [place] [address]    Culture STAPHYLOCOCCUS CAPITIS (A)  Final   Report Status 02/14/2019 FINAL  Final   Organism ID, Bacteria STAPHYLOCOCCUS CAPITIS  Final      Susceptibility   Staphylococcus capitis - MIC*    CIPROFLOXACIN <=0.5 SENSITIVE Sensitive     ERYTHROMYCIN 0.5 SENSITIVE Sensitive     GENTAMICIN <=0.5 SENSITIVE  Sensitive     OXACILLIN <=0.25 SENSITIVE Sensitive     TETRACYCLINE <=1 SENSITIVE Sensitive     VANCOMYCIN 1 SENSITIVE Sensitive     TRIMETH/SULFA <=10 SENSITIVE Sensitive     CLINDAMYCIN <=0.25 SENSITIVE Sensitive     RIFAMPIN <=0.5 SENSITIVE Sensitive     Inducible Clindamycin NEGATIVE Sensitive     * STAPHYLOCOCCUS CAPITIS  Culture, blood (Routine x 2)     Status: Abnormal   Collection Time: 02/10/19  9:21 PM   Specimen: BLOOD RIGHT HAND  Result Value Ref Range Status   Specimen Description   Final    BLOOD RIGHT HAND Performed at [facility], [place] [address], [place], [place] [address]    Special Requests   Final    BOTTLES DRAWN AEROBIC AND ANAEROBIC Blood Culture adequate volume Performed at  [facility], [place] [address] [place], [place] [address]    Culture  Setup Time   Final    GRAM POSITIVE COCCI IN BOTH AEROBIC AND ANAEROBIC BOTTLES CRITICAL RESULT CALLED TO, READ BACK BY AND VERIFIED WITH: Sheffield Slider PharmD 17:00 02/11/19 ([email or web address])    Culture (A)  Final    STAPHYLOCOCCUS CAPITIS SUSCEPTIBILITIES PERFORMED ON PREVIOUS CULTURE WITHIN THE LAST 5 DAYS. Performed at [facility], [place] [address] [place], [place] [address]    Report Status 02/14/2019 FINAL  Final  Blood Culture ID Panel (Reflexed)     Status: Abnormal   Collection Time: 02/10/19  9:21 PM  Result Value Ref Range Status   Enterococcus species NOT DETECTED NOT DETECTED Final   Listeria monocytogenes NOT DETECTED NOT DETECTED Final   Staphylococcus species DETECTED (A) NOT DETECTED Final    Comment: Methicillin (oxacillin) susceptible coagulase negative staphylococcus. Possible blood culture contaminant (unless isolated from more than one blood culture draw or clinical case suggests pathogenicity). No antibiotic treatment is indicated for blood  culture contaminants. CRITICAL RESULT CALLED TO, READ BACK BY AND VERIFIED WITH: Sheffield Slider PharmD 17:00 02/11/19 ([email or web address])     Staphylococcus aureus (BCID) NOT DETECTED NOT DETECTED Final   Methicillin resistance NOT DETECTED NOT DETECTED Final   Streptococcus species NOT DETECTED NOT DETECTED Final   Streptococcus agalactiae NOT DETECTED NOT DETECTED Final   Streptococcus pneumoniae NOT DETECTED NOT DETECTED Final   Streptococcus pyogenes NOT DETECTED NOT DETECTED Final   Acinetobacter baumannii NOT DETECTED NOT DETECTED Final   Enterobacteriaceae species NOT DETECTED NOT DETECTED Final   Enterobacter cloacae complex NOT DETECTED NOT DETECTED Final   Escherichia coli NOT DETECTED NOT DETECTED Final   Klebsiella oxytoca NOT DETECTED NOT DETECTED Final   Klebsiella pneumoniae NOT DETECTED NOT DETECTED Final   Proteus species NOT DETECTED NOT DETECTED Final   Serratia marcescens NOT DETECTED NOT DETECTED Final   Haemophilus influenzae NOT DETECTED NOT DETECTED Final   Neisseria meningitidis NOT DETECTED NOT DETECTED Final   Pseudomonas aeruginosa NOT DETECTED NOT DETECTED Final   Candida albicans NOT DETECTED NOT DETECTED Final   Candida glabrata NOT DETECTED NOT DETECTED Final   Candida krusei NOT DETECTED NOT DETECTED Final   Candida parapsilosis NOT DETECTED NOT DETECTED Final   Candida tropicalis NOT DETECTED NOT DETECTED Final    Comment: Performed at [facility], [address] [place], [place] [address]  SARS Coronavirus 2 [facility] order, Performed in [facility] lab) Nasopharyngeal Nasopharyngeal Swab     Status: None   Collection Time: 02/10/19 11:23 PM   Specimen: Nasopharyngeal Swab  Result Value Ref Range Status   SARS Coronavirus 2 NEGATIVE NEGATIVE Final    Comment: (NOTE) If result is NEGATIVE SARS-CoV-2 target nucleic acids are NOT DETECTED. The SARS-CoV-2 RNA is generally detectable in upper and lower  respiratory specimens during the acute phase of infection. The lowest  concentration of SARS-CoV-2 viral copies this assay can detect is 250  copies / mL. A negative result  does not preclude SARS-CoV-2 infection  and should not be used as the sole basis for treatment or other  patient management decisions.  A negative result may occur with  improper specimen collection / handling, submission of specimen other  than nasopharyngeal swab, presence of viral mutation(s) within the  areas targeted by this assay, and inadequate number of viral copies  (<250 copies / mL). A negative result must be combined with clinical  observations,  patient history, and epidemiological information. If result is POSITIVE SARS-CoV-2 target nucleic acids are DETECTED. The SARS-CoV-2 RNA is generally detectable in upper and lower  respiratory specimens dur ing the acute phase of infection.  Positive  results are indicative of active infection with SARS-CoV-2.  Clinical  correlation with patient history and other diagnostic information is  necessary to determine patient infection status.  Positive results do  not rule out bacterial infection or co-infection with other viruses. If result is PRESUMPTIVE POSTIVE SARS-CoV-2 nucleic acids MAY BE PRESENT.   A presumptive positive result was obtained on the submitted specimen  and confirmed on repeat testing.  While 2019 novel coronavirus  (SARS-CoV-2) nucleic acids may be present in the submitted sample  additional confirmatory testing may be necessary for epidemiological  and / or clinical management purposes  to differentiate between  SARS-CoV-2 and other Sarbecovirus currently known to infect humans.  If clinically indicated additional testing with an alternate test  methodology [id number]) is advised. The SARS-CoV-2 RNA is generally  detectable in upper and lower respiratory sp ecimens during the acute  phase of infection. The expected result is Negative. Fact Sheet for Patients:  StrictlyIdeas.no Fact Sheet for Healthcare Providers: BankingDealers.co.za This test is not yet approved or  cleared by the [place] FDA and has been authorized for detection and/or diagnosis of SARS-CoV-2 by FDA under an Emergency Use Authorization (EUA).  This EUA will remain in effect (meaning this test can be used) for the duration of the COVID-19 declaration under Section 564(b)(1) of the Act, 21 U.S.C. section 360bbb-3(b)(1), unless the authorization is terminated or revoked sooner. Performed at [facility], [place] [address] [place], [place] [address]   Urine culture     Status: Abnormal   Collection Time: 02/11/19  2:00 AM   Specimen: Urine, Random  Result Value Ref Range Status   Specimen Description   Final    URINE, RANDOM Performed at [facility] [address], [place], [place] [address]    Special Requests   Final    NONE Performed at [facility], [place] [address] [place], [place] [address]    Culture >=100,000 COLONIES/mL ENTEROCOCCUS FAECALIS (A)  Final   Report Status 02/12/2019 FINAL  Final   Organism ID, Bacteria ENTEROCOCCUS FAECALIS (A)  Final      Susceptibility   Enterococcus faecalis - MIC*    AMPICILLIN <=2 SENSITIVE Sensitive     LEVOFLOXACIN 2 SENSITIVE Sensitive     NITROFURANTOIN <=16 SENSITIVE Sensitive     VANCOMYCIN <=0.5 SENSITIVE Sensitive     * >=100,000 COLONIES/mL ENTEROCOCCUS FAECALIS  Culture, sputum-assessment     Status: None   Collection Time: 02/11/19  9:41 AM   Specimen: Sputum  Result Value Ref Range Status   Specimen Description SPUTUM  Final   Special Requests NONE  Final   Sputum evaluation   Final    Sputum specimen not acceptable for testing.  Please recollect.   NOTIFIED Pauline Good [id number] Performed at [facility], [place] [address], [place], [place] [address]    Report Status 02/11/2019 FINAL  Final  Culture, blood (routine x 2)     Status: None (Preliminary result)   Collection Time: 02/11/19  6:32 PM   Specimen: BLOOD  Result Value Ref Range  Status   Specimen Description   Final    BLOOD RIGHT ANTECUBITAL Performed at [facility] [address], [place], [place] [address]  Special Requests   Final    BOTTLES DRAWN AEROBIC ONLY Blood Culture adequate volume Performed at [facility] [address] [place], [place] [address]    Culture   Final    NO GROWTH 3 DAYS Performed at [facility], [place] [address] [place], [place] [address]    Report Status PENDING  Incomplete  Culture, blood (routine x 2)     Status: None (Preliminary result)   Collection Time: 02/11/19  6:37 PM   Specimen: BLOOD  Result Value Ref Range Status   Specimen Description   Final    BLOOD LEFT ANTECUBITAL Performed at [facility] [address], [place], [place] [address]    Special Requests   Final    BOTTLES DRAWN AEROBIC ONLY Blood Culture adequate volume Performed at [facility] [address] [place], [place] [address]    Culture   Final    NO GROWTH 3 DAYS Performed at [facility], [place] [address] [place], [place] [address]    Report Status PENDING  Incomplete    Radiology Studies: Ir Removal Tun Access W/ [place] W/o [place] Mod Sed  Result Date: 02/13/2019 INDICATION: 73 year old female with a right IJ approach port catheter which was placed by interventional radiology (Dr. Kathlene Cote) on 11/06/2018. Unfortunately, the patient is currently immunocompromised on chemotherapy with bacteremia and concern for seeding of the port catheter. She presents for port removal and placement of a PICC so that she may continue her chemotherapy. EXAM: REMOVAL RIGHT IJ VEIN PORT-A-CATH MEDICATIONS: None ANESTHESIA/SEDATION: None FLUOROSCOPY TIME:  None COMPLICATIONS: None immediate. PROCEDURE: Informed written consent was obtained from the patient after a thorough discussion of the procedural risks, benefits and alternatives. All questions were addressed. Maximal Sterile  Barrier Technique was utilized including caps, mask, sterile gowns, sterile gloves, sterile drape, hand hygiene and skin antiseptic. A timeout was performed prior to the initiation of the procedure. The right chest was prepped and draped in a sterile fashion. Lidocaine was utilized for local anesthesia. An incision was made over the previously healed surgical incision. Utilizing blunt dissection, the port catheter and reservoir were removed from the underlying subcutaneous tissue in their entirety. Securing sutures were also removed. The pocket was irrigated with a copious amount of sterile normal saline. The pocket was closed with interrupted 3-0 Vicryl stitches. The subcutaneous tissue was closed with 3-0 Vicryl interrupted subcutaneous stitches. A 4-0 Vicryl running subcuticular stitch was utilized to approximate the skin. Dermabond was applied. IMPRESSION: Successful right IJ vein Port-A-Cath explant. No evidence of infection within the reservoir pocket. Electronically Signed   By: Jacqulynn Cadet M.D.   On: 02/13/2019 17:08   Ir Picc Placement Right >5 Yrs Inc Img Guide  Result Date: 02/13/2019 INDICATION: 73 year old female with suspected bacterial seeding of her port catheter and a continued need for chemotherapy. She presents for port catheter removal and simultaneous PICC placement. EXAM: PICC LINE PLACEMENT WITH ULTRASOUND AND FLUOROSCOPIC GUIDANCE MEDICATIONS: None. ANESTHESIA/SEDATION: None. FLUOROSCOPY TIME:  Fluoroscopy Time: 0 minutes 12 seconds (3 mGy). COMPLICATIONS: None immediate. PROCEDURE: The patient was advised of the possible risks and complications and agreed to undergo the procedure. The patient was then brought to the angiographic suite for the procedure. The right arm was prepped with chlorhexidine, draped in the usual sterile fashion using maximum barrier technique (cap and mask, sterile gown, sterile gloves, large sterile sheet, hand hygiene and cutaneous antisepsis) and  infiltrated locally with 1% Lidocaine. Ultrasound  demonstrated patency of the right brachial vein, and this was documented with an image. Under real-time ultrasound guidance, this vein was accessed with a 21 gauge micropuncture needle and image documentation was performed. A 0.018 wire was introduced in to the vein. Over this, a 5 [place] single lumen power-injectable PICC was advanced to the lower SVC/right atrial junction. Fluoroscopy during the procedure and fluoro spot radiograph confirms appropriate catheter position. The catheter was flushed and covered with a sterile dressing. Catheter length: 36 cm IMPRESSION: Successful right arm Power PICC line placement with ultrasound and fluoroscopic guidance. The catheter is ready for use. Electronically Signed   By: Jacqulynn Cadet M.D.   On: 02/13/2019 17:09   Scheduled Meds:  Chlorhexidine Gluconate Cloth  6 each Topical Daily   enoxaparin (LOVENOX) injection  40 mg Subcutaneous Q24H   gabapentin  300 mg Oral BID   potassium chloride  40 mEq Oral BID   sodium chloride flush  3 mL Intravenous Q12H   Continuous Infusions:  sodium chloride 75 mL/hr at 02/13/19 1600   cefTRIAXone (ROCEPHIN)  IV 2 g (02/14/19 1427)    LOS: 3 days   Kerney Elbe, DO [facility] PAGER is on [place]  If 7PM-7AM, please contact night-coverage [email or web address] Password [facility] 02/14/2019, 3:32 PM

## 2019-02-14 NOTE — Progress Notes (Addendum)
Patient ID: Zoe Kim, female   DOB: June 09, 1945, 73 y.o.   MRN: [id number]         [facility] for Infectious Disease  Date of Admission:  02/10/2019           Day 5 vancomycin ASSESSMENT: The patient is a 73 year old white female with uterine cancer status post recent chemotherapy and radiation now admitted with fever and coag negative staph bacteremia concerning for Port-A-Cath infection.  PLAN: 1. Coag negative staph bacteremia-continue vancomycin pending final blood culture results (1 of her 3 isolates does confirm methicillin sensitive coag negative staph but the other 2 isolates and sensitivities remain pending at this time).  The patient's Port-A-Cath was removed yesterday.  Her repeat blood cultures from February 11, 2019 remain unrevealing thus far.  Her transthoracic echocardiogram showed no evidence of endocarditis.  She now has a PICC line that is intact and was placed after greater than 48 hours of negative blood cultures.  I would anticipate a 2-week duration of antibiotic treatment beginning from the time of her Port-A-Cath removal.  Tentative discharge date for antibiotics would be February 27, 2019. 2. Fever- patient is now been afebrile for the last 36 hours with empiric antibiotics.  Her Port-A-Cath is now been removed in the interim.  Repeat the patient's blood cultures x2 for any fevers greater than 100.5.  Source of her fever does appear to be her coag negative staph bacteremia at this time. 3. Uterine cancer- patient is recently been receiving both chemotherapy and XRT.  Per her report, she believes that she does still have 2 cycles of chemotherapy remaining.  Most recently, her chemotherapy has been delayed due to anemia.  Will defer reinitiation of chemotherapy to her oncologist.  As her PICC line was placed during an episode of bacteremia, if this is site is to be used for chemotherapy, it would best be done with exchange of her PICC line over a guidewire to  lower the risk of recurrence of her infection.  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 Orders updated. OPAT instructions noted at end of today's note. CM may make effort to see if infusion company can provide meds and set up [facility] for pt but uncertain if this was done earlier in the week to facilitate a weekend d/c, so pt may have to have this arranged on Monday. Hospitalist aware.  Principal Problem:   Fever Active Problems:   Endometrial cancer ([place])   Peripheral neuropathy due to chemotherapy ([place])   Anemia due to antineoplastic chemotherapy   Coagulase negative Staphylococcus bacteremia   Urinary incontinence   Scheduled Meds: . Chlorhexidine Gluconate Cloth  6 each Topical Daily  . enoxaparin (LOVENOX) injection  40 mg Subcutaneous Q24H  . gabapentin  300 mg Oral BID  . sodium chloride flush  3 mL Intravenous Q12H   Continuous Infusions: . sodium chloride 75 mL/hr at 02/13/19 1600  . vancomycin 1,250 mg (02/13/19 2138)   PRN Meds:.acetaminophen **OR** acetaminophen, guaiFENesin-dextromethorphan, Melatonin, morphine injection, ondansetron **OR** ondansetron (ZOFRAN) IV, polyethylene glycol, sodium chloride flush, traMADol   SUBJECTIVE: Pt has been afebrile for the past 36 hrs now. Portocath removed yesterday afternoon and RT arm PICC now intact. Appetite remains poor but she states she is beginning to feel better and actually ambulated within the room this morning already.  Review of Systems: Review of Systems  Constitutional: Positive for fever and malaise/fatigue. Negative for chills,  diaphoresis and weight loss.  HENT: Negative for congestion and sore throat.   Respiratory: Negative for cough, sputum production and shortness of breath.   Cardiovascular: Negative for chest pain.  Gastrointestinal: Positive for constipation. Negative for abdominal pain, diarrhea, nausea and vomiting.   Genitourinary: Positive for frequency. Negative for dysuria and urgency.  Musculoskeletal: Negative for back pain, joint pain and myalgias.  Neurological: Negative for headaches.    Allergies  Allergen Reactions  . Sulfa Antibiotics     UNSPECIFIED REACTION     OBJECTIVE: Vitals:   02/13/19 1303 02/13/19 1733 02/13/19 2101 02/14/19 0600  BP: 122/70 133/80 140/72 131/80  Pulse: 90 86 96 90  Resp: '20  19 18  '$ Temp: 98.4 F (36.9 C) 98.8 F (37.1 C) 98.2 F (36.8 C) 98.7 F (37.1 C)  TempSrc: Oral Oral Oral Oral  SpO2: 97% 94% 96% 95%  Weight:      Height:       Body mass index is 29.18 kg/m. Physical Exam Lines: RT arm PICC Gen: pleasant, NAD, A&Ox 3 Head: NCAT, +alopecia, no temporal wasting evident EENT: PERRL, EOMI, MMM, adequate dentition Neck: supple, no JVD CV: NRRR, no murmurs evident, bandage at former RT chest portocath site Pulm: CTA bilaterally, no wheeze or retractions Abd: soft, NTND, +BS Extrems: no LE edema, 2+ pulses Skin: no rashes, poor skin turgor with skin tenting noted to both legs Neuro: CN II-XII grossly intact, no focal neurologic deficits appreciated, gait was not assessed, A&Ox 3  Lab Results Lab Results  Component Value Date   WBC 5.5 02/14/2019   HGB 8.5 (L) 02/14/2019   HCT 25.2 (L) 02/14/2019   MCV 97.7 02/14/2019   PLT 202 02/14/2019    Lab Results  Component Value Date   CREATININE 0.64 02/14/2019   BUN 9 02/14/2019   NA 138 02/14/2019   K 3.4 (L) 02/14/2019   CL 106 02/14/2019   CO2 24 02/14/2019    Lab Results  Component Value Date   ALT 19 02/14/2019   AST 25 02/14/2019   ALKPHOS 87 02/14/2019   BILITOT 0.4 02/14/2019     Microbiology: Recent Results (from the past 240 hour(s))  Culture, blood (Routine x 2)     Status: None (Preliminary result)   Collection Time: 02/10/19  9:16 PM   Specimen: BLOOD  Result Value Ref Range Status   Specimen Description   Final    BLOOD RIGHT WRIST Performed at [facility], [address] [place], [place] [address]    Special Requests   Final    BOTTLES DRAWN AEROBIC AND ANAEROBIC Blood Culture adequate volume Performed at [facility] [address] [place], [place] [address]    Culture  Setup Time   Final    GRAM POSITIVE COCCI AEROBIC BOTTLE ONLY CRITICAL RESULT CALLED TO, READ BACK BY AND VERIFIED WITH: DR CAMPBELL [id number] AT 1157 AM BY CM    Culture   Final    GRAM POSITIVE COCCI IDENTIFICATION AND SUSCEPTIBILITIES TO FOLLOW Performed at [facility], [place] [address], [place], [place] [address]    Report Status PENDING  Incomplete  Culture, blood (Routine x 2)     Status: None (Preliminary result)   Collection Time: 02/10/19  9:21 PM   Specimen: BLOOD  Result Value Ref Range Status   Specimen Description   Final    BLOOD BLOOD RIGHT HAND Performed at [place] [address] [place], [place]  [address]    Special Requests   Final    BOTTLES DRAWN AEROBIC AND ANAEROBIC Blood Culture adequate volume Performed at [facility] [address] [place], [place] [address]    Culture  Setup Time   Final    GRAM POSITIVE COCCI IN BOTH AEROBIC AND ANAEROBIC BOTTLES CRITICAL RESULT CALLED TO, READ BACK BY AND VERIFIED WITH: Sheffield Slider PharmD 17:00 02/11/19 ([email or web address])    Culture   Final    GRAM POSITIVE COCCI CULTURE REINCUBATED FOR BETTER GROWTH Performed at [facility], [place] [address], [place], [place] [address]    Report Status PENDING  Incomplete  Blood Culture ID Panel (Reflexed)     Status: Abnormal   Collection Time: 02/10/19  9:21 PM  Result Value Ref Range Status   Enterococcus species NOT DETECTED NOT DETECTED Final   Listeria monocytogenes NOT DETECTED NOT DETECTED Final   Staphylococcus species DETECTED (A) NOT DETECTED Final    Comment: Methicillin (oxacillin) susceptible coagulase negative staphylococcus. Possible blood culture contaminant  (unless isolated from more than one blood culture draw or clinical case suggests pathogenicity). No antibiotic treatment is indicated for blood  culture contaminants. CRITICAL RESULT CALLED TO, READ BACK BY AND VERIFIED WITH: Sheffield Slider PharmD 17:00 02/11/19 ([email or web address])    Staphylococcus aureus (BCID) NOT DETECTED NOT DETECTED Final   Methicillin resistance NOT DETECTED NOT DETECTED Final   Streptococcus species NOT DETECTED NOT DETECTED Final   Streptococcus agalactiae NOT DETECTED NOT DETECTED Final   Streptococcus pneumoniae NOT DETECTED NOT DETECTED Final   Streptococcus pyogenes NOT DETECTED NOT DETECTED Final   Acinetobacter baumannii NOT DETECTED NOT DETECTED Final   Enterobacteriaceae species NOT DETECTED NOT DETECTED Final   Enterobacter cloacae complex NOT DETECTED NOT DETECTED Final   Escherichia coli NOT DETECTED NOT DETECTED Final   Klebsiella oxytoca NOT DETECTED NOT DETECTED Final   Klebsiella pneumoniae NOT DETECTED NOT DETECTED Final   Proteus species NOT DETECTED NOT DETECTED Final   Serratia marcescens NOT DETECTED NOT DETECTED Final   Haemophilus influenzae NOT DETECTED NOT DETECTED Final   Neisseria meningitidis NOT DETECTED NOT DETECTED Final   Pseudomonas aeruginosa NOT DETECTED NOT DETECTED Final   Candida albicans NOT DETECTED NOT DETECTED Final   Candida glabrata NOT DETECTED NOT DETECTED Final   Candida krusei NOT DETECTED NOT DETECTED Final   Candida parapsilosis NOT DETECTED NOT DETECTED Final   Candida tropicalis NOT DETECTED NOT DETECTED Final    Comment: Performed at [facility], [address], [place], [place] [address]  SARS Coronavirus 2 [facility] order, Performed in [facility] hospital lab) Nasopharyngeal Nasopharyngeal Swab     Status: None   Collection Time: 02/10/19 11:23 PM   Specimen: Nasopharyngeal Swab  Result Value Ref Range Status   SARS Coronavirus 2 NEGATIVE NEGATIVE Final    Comment: (NOTE) If result is NEGATIVE SARS-CoV-2  target nucleic acids are NOT DETECTED. The SARS-CoV-2 RNA is generally detectable in upper and lower  respiratory specimens during the acute phase of infection. The lowest  concentration of SARS-CoV-2 viral copies this assay can detect is 250  copies / mL. A negative result does not preclude SARS-CoV-2 infection  and should not be used as the sole basis for treatment or other  patient management decisions.  A negative result may occur with  improper specimen collection / handling, submission of specimen other  than nasopharyngeal swab, presence of viral mutation(s) within the  areas targeted by this assay, and inadequate number  of viral copies  (<250 copies / mL). A negative result must be combined with clinical  observations, patient history, and epidemiological information. If result is POSITIVE SARS-CoV-2 target nucleic acids are DETECTED. The SARS-CoV-2 RNA is generally detectable in upper and lower  respiratory specimens dur ing the acute phase of infection.  Positive  results are indicative of active infection with SARS-CoV-2.  Clinical  correlation with patient history and other diagnostic information is  necessary to determine patient infection status.  Positive results do  not rule out bacterial infection or co-infection with other viruses. If result is PRESUMPTIVE POSTIVE SARS-CoV-2 nucleic acids MAY BE PRESENT.   A presumptive positive result was obtained on the submitted specimen  and confirmed on repeat testing.  While 2019 novel coronavirus  (SARS-CoV-2) nucleic acids may be present in the submitted sample  additional confirmatory testing may be necessary for epidemiological  and / or clinical management purposes  to differentiate between  SARS-CoV-2 and other Sarbecovirus currently known to infect humans.  If clinically indicated additional testing with an alternate test  methodology [phone]) is advised. The SARS-CoV-2 RNA is generally  detectable in upper and lower  respiratory sp ecimens during the acute  phase of infection. The expected result is Negative. Fact Sheet for Patients:  StrictlyIdeas.no Fact Sheet for Healthcare Providers: BankingDealers.co.za This test is not yet approved or cleared by the [place] FDA and has been authorized for detection and/or diagnosis of SARS-CoV-2 by FDA under an Emergency Use Authorization (EUA).  This EUA will remain in effect (meaning this test can be used) for the duration of the COVID-19 declaration under Section 564(b)(1) of the Act, 21 U.S.C. section 360bbb-3(b)(1), unless the authorization is terminated or revoked sooner. Performed at [facility], [place] [address], [place], [place] [address]   Urine culture     Status: Abnormal   Collection Time: 02/11/19  2:00 AM   Specimen: Urine, Random  Result Value Ref Range Status   Specimen Description   Final    URINE, RANDOM Performed at [facility] [address], [place], [place] [address]    Special Requests   Final    NONE Performed at [facility], [place] [address] [place], [place] [address]    Culture >=100,000 COLONIES/mL ENTEROCOCCUS FAECALIS (A)  Final   Report Status 02/12/2019 FINAL  Final   Organism ID, Bacteria ENTEROCOCCUS FAECALIS (A)  Final      Susceptibility   Enterococcus faecalis - MIC*    AMPICILLIN <=2 SENSITIVE Sensitive     LEVOFLOXACIN 2 SENSITIVE Sensitive     NITROFURANTOIN <=16 SENSITIVE Sensitive     VANCOMYCIN <=0.5 SENSITIVE Sensitive     * >=100,000 COLONIES/mL ENTEROCOCCUS FAECALIS  Culture, sputum-assessment     Status: None   Collection Time: 02/11/19  9:41 AM   Specimen: Sputum  Result Value Ref Range Status   Specimen Description SPUTUM  Final   Special Requests NONE  Final   Sputum evaluation   Final    Sputum specimen not acceptable for testing.  Please recollect.   NOTIFIED Pauline Good [phone]  Performed at [facility], [place] [address], [place],  [address]    Report Status 02/11/2019 FINAL  Final  Culture, blood (routine x 2)     Status: None (Preliminary result)   Collection Time: 02/11/19  6:32 PM   Specimen: BLOOD  Result Value Ref Range Status   Specimen Description   Final  BLOOD RIGHT ANTECUBITAL Performed at [facility] [address], [place], [place] [address]    Special Requests   Final    BOTTLES DRAWN AEROBIC ONLY Blood Culture adequate volume Performed at [facility] [address] [place], [place] [address]    Culture   Final    NO GROWTH 3 DAYS Performed at [facility], [place] [address] [place], [place] [address]    Report Status PENDING  Incomplete  Culture, blood (routine x 2)     Status: None (Preliminary result)   Collection Time: 02/11/19  6:37 PM   Specimen: BLOOD  Result Value Ref Range Status   Specimen Description   Final    BLOOD LEFT ANTECUBITAL Performed at [facility] [address] [place], [place] [address]    Special Requests   Final    BOTTLES DRAWN AEROBIC ONLY Blood Culture adequate volume Performed at [facility] [address], [place], [place] [address]    Culture   Final    NO GROWTH 3 DAYS Performed at [facility], [place] [address] [place], [place] [address]    Report Status PENDING  Incomplete   OPAT ORDERS:  Diagnosis: MS-CoNS BSI/portocath infection  Culture Result: MS-CoNS (capitus)  Allergies  Allergen Reactions  . Sulfa Antibiotics     UNSPECIFIED REACTION     Discharge antibiotics: Rocephin 2 gm IV daily Duration: 2 weeks from time of portocath explantation End Date: 02/27/2019  [facility] Care and Maintenance Per Protocol _X_ Please pull PIC at completion of IV antibiotics __ Please leave PIC in place until doctor has seen patient or been notified  Labs weekly while on IV  antibiotics: _X_ CBC with differential _X_ BMP __ BMP TWICE WEEKLY** __ CMP __ CRP __ ESR __ Vancomycin trough  Fax weekly labs to [phone]  Clinic Follow Up Appt: Call clinic for f/u in 1-2 weeks with Dr. Lilly Cove, [place] for [phone] pager    02/14/2019, 8:49 AM

## 2019-02-15 LAB — TYPE AND SCREEN
ABO/RH(D): O POS
Antibody Screen: NEGATIVE
Unit division: 0

## 2019-02-15 LAB — COMPREHENSIVE METABOLIC PANEL
ALT: 20 U/L (ref 0–44)
AST: 26 U/L (ref 15–41)
Albumin: 3 g/dL — ABNORMAL LOW (ref 3.5–5.0)
Alkaline Phosphatase: 86 U/L (ref 38–126)
Anion gap: 7 (ref 5–15)
BUN: 8 mg/dL (ref 8–23)
CO2: 26 mmol/L (ref 22–32)
Calcium: 8.7 mg/dL — ABNORMAL LOW (ref 8.9–10.3)
Chloride: 107 mmol/L (ref 98–111)
Creatinine, Ser: 0.53 mg/dL (ref 0.44–1.00)
GFR calc Af Amer: >60 mL/min (ref >60)
GFR calc non Af Amer: >60 mL/min (ref >60)
Glucose, Bld: 98 mg/dL (ref 70–99)
Potassium: 4.2 mmol/L (ref 3.5–5.1)
Sodium: 140 mmol/L (ref 135–145)
Total Bilirubin: 0.7 mg/dL (ref 0.3–1.2)
Total Protein: 5.9 g/dL — ABNORMAL LOW (ref 6.5–8.1)

## 2019-02-15 LAB — CBC WITH DIFFERENTIAL/PLATELET
Abs Immature Granulocytes: 0.04 10*3/uL (ref 0.00–0.07)
Basophils Absolute: 0 10*3/uL (ref 0.0–0.1)
Basophils Relative: 0 %
Eosinophils Absolute: 0.4 10*3/uL (ref 0.0–0.5)
Eosinophils Relative: 9 %
HCT: 25.6 % — ABNORMAL LOW (ref 36.0–46.0)
Hemoglobin: 8.4 g/dL — ABNORMAL LOW (ref 12.0–15.0)
Immature Granulocytes: 1 %
Lymphocytes Relative: 33 %
Lymphs Abs: 1.5 10*3/uL (ref 0.7–4.0)
MCH: 32.4 pg (ref 26.0–34.0)
MCHC: 32.8 g/dL (ref 30.0–36.0)
MCV: 98.8 fL (ref 80.0–100.0)
Monocytes Absolute: 0.6 10*3/uL (ref 0.1–1.0)
Monocytes Relative: 14 %
Neutro Abs: 1.9 10*3/uL (ref 1.7–7.7)
Neutrophils Relative %: 43 %
Platelets: 228 10*3/uL (ref 150–400)
RBC: 2.59 MIL/uL — ABNORMAL LOW (ref 3.87–5.11)
RDW: 16.2 % — ABNORMAL HIGH (ref 11.5–15.5)
WBC: 4.5 10*3/uL (ref 4.0–10.5)
nRBC: 0 % (ref 0.0–0.2)

## 2019-02-15 LAB — MAGNESIUM: Magnesium: 1.8 mg/dL (ref 1.7–2.4)

## 2019-02-15 LAB — BPAM RBC
Blood Product Expiration Date: 202010082359
ISSUE DATE / TIME: 202009110847
Unit Type and Rh: 5100

## 2019-02-15 LAB — PHOSPHORUS: Phosphorus: 3.2 mg/dL (ref 2.5–4.6)

## 2019-02-15 MED ORDER — MAGNESIUM SULFATE 2 GM/50ML IV SOLN
2.0000 g | Freq: Once | INTRAVENOUS | Status: AC
  Administered 2019-02-15: 2 g via INTRAVENOUS
  Filled 2019-02-15: qty 50

## 2019-02-15 NOTE — Progress Notes (Signed)
Occupational Therapy Treatment Patient Details Name: Zoe Kim MRN: [id number] DOB: May 27, 1946 Today's Date: 02/15/2019    History of present illness 73yo female c/o fever, fatigue, and general weakness. Covid test negative. Admitted for SIRS. PMH anxiety, endometrial CA currently on radiation and chemo, TKR, peripheral neuropathy   OT comments  Pt overall min guard with ADL activity.  Pts husband will A at home. Pt aware of her balance deficits (pt is a ballerina).  Educated pt on use of walker for safety.   Follow Up Recommendations  Home health OT;Supervision/Assistance - 24 hour    Equipment Recommendations  None recommended by OT    Recommendations for Other Services      Precautions / Restrictions Precautions Precautions: Fall Restrictions Weight Bearing Restrictions: No       Mobility Bed Mobility Overal bed mobility: Needs Assistance Bed Mobility: Sit to Supine       Sit to supine: Min guard   General bed mobility comments: min guard for safety, no physical assist given  Transfers Overall transfer level: Needs assistance Equipment used: Rolling walker (2 wheeled) Transfers: Sit to/from [facility] Sit to Stand: Min assist Stand pivot transfers: Min assist       General transfer comment: min guard for safety, VC for hand placement no physical assist given    Balance Overall balance assessment: Needs assistance Sitting-balance support: Bilateral upper extremity supported;Feet supported Sitting balance-Leahy Scale: Good       Standing balance-Leahy Scale: Fair Standing balance comment: reliant on external support                           ADL either performed or assessed with clinical judgement   ADL Overall ADL's : Needs assistance/impaired     Grooming: Standing;Min guard;Cueing for safety                   Toilet Transfer: Minimal assistance;Ambulation;RW;Comfort height toilet;Regular Toilet;Cueing  for sequencing;Cueing for safety   Toileting- Clothing Manipulation and Hygiene: Minimal assistance;Sit to/from stand;Cueing for safety;Cueing for sequencing         General ADL Comments: yellow therand issued and pt educated in use.     Vision Patient Visual Report: No change from baseline            Cognition Arousal/Alertness: Awake/alert Behavior During Therapy: WFL for tasks assessed/performed Overall Cognitive Status: Within Functional Limits for tasks assessed                                          Exercises General Exercises - Upper Extremity Shoulder Flexion: Theraband;15 reps Theraband Level (Shoulder Flexion): Level 1 (Yellow) Elbow Flexion: AROM;15 reps;Seated;Theraband Theraband Level (Elbow Flexion): Level 1 (Yellow) Elbow Extension: AROM;Strengthening;Seated;Theraband;15 reps Theraband Level (Elbow Extension): Level 1 (Yellow)           Pertinent Vitals/ Pain       Pain Assessment: No/denies pain         Frequency  Min 2X/week        Progress Toward Goals  OT Goals(current goals can now be found in the care plan section)  Progress towards OT goals: Progressing toward goals     Plan Discharge plan remains appropriate       AM-PAC OT "6 Clicks" Daily Activity     Outcome Measure   Help from another person eating  meals?: None Help from another person taking care of personal grooming?: A Little Help from another person toileting, which includes using toliet, bedpan, or urinal?: A Little Help from another person bathing (including washing, rinsing, drying)?: A Little Help from another person to put on and taking off regular upper body clothing?: None Help from another person to put on and taking off regular lower body clothing?: A Little 6 Click Score: 20    End of Session Equipment Utilized During Treatment: Rolling walker;Gait belt  OT Visit Diagnosis: Unsteadiness on feet (R26.81);Muscle weakness (generalized)  (M62.81)   Activity Tolerance Patient tolerated treatment well   Patient Left in bed;with call bell/phone within reach   Nurse Communication Mobility status        Time: 1241-1300 OT Time Calculation (min): 19 min  Charges: OT General Charges $OT Visit: 1 Visit OT Treatments $Self Care/Home Management : 8-22 mins  Kari Baars, [place] [phone] Office- [place], Edwena Felty D 02/15/2019, 2:07 PM

## 2019-02-15 NOTE — Progress Notes (Signed)
PROGRESS NOTE    Zoe Kim  [id number] DOB: [birth year] DOA: 02/10/2019 PCP: [name], MD   Brief Narrative:  Patient is a 73 year old female with a past medical history significant for but not limited to endometrial cancer currently undergoing treatment with radiation and chemotherapy which is complicated by peripheral neuropathy and anemia who presented to the ED with a day of fevers, chills, fatigue and generalized weakness.  She reports that she woke up on the morning of 02/10/2019 with fevers chills and malaise and intermittently over the last month or so she has been having similar symptoms but had been worsening recently.  Denied any sick contacts.  In the ED she was found to be febrile and saturating on room air with tachycardia and a stable blood pressure. COVID Testing was negative.  Urinalysis showed some rare bacteria, moderate leukocytes, 11-20 WBCs per high-power field.    In the ED she was given normal saline 1.5 L boluses and started on broad-spectrum antibiotics with IV vancomycin IV cefepime.  Further work-up reveals a MSSA Gram Postive Cocci Bacteremia of Staph Capitis and urine is growing out greater than 100,000 colony-forming units of Enterococcus faecalis.  Infectious disease was consulted for further evaluation recommendations and an echocardiogram has been ordered for her bacteremia.  Infectious Diseases was consulted to adjust her antibiotic regimen currently and further work-up has been pursued and blood cultures were repeated which are showing NGTD at 3 Days.  Likely source of her infection is her Port-A-Cath and patient has elected for Port-A-Cath to be removed even though ID states that she can keep it.  I spoke with Dr. Prince Rome today and he recommends discontinuing vancomycin given the pan sensitivity for her Staph Capitis infection and changing to IV ceftriaxone.  Interventional radiology was consulted for Port-A-Cath removal and since this is the  patient's second bacteremia since July.  She is typed and screened and transfused 1 unit of PRBCs given her anemia.  She is improving daily and can likely be discharged home once her OPAT are in place along with equipment and this may be Monday now.  Currently medically stable and awaiting for home health to be set up.  Assessment & Plan:   Principal Problem:   Fever Active Problems:   Endometrial cancer ([place])   Peripheral neuropathy due to chemotherapy ([facility])   Anemia due to antineoplastic chemotherapy   Coagulase negative Staphylococcus bacteremia   Urinary incontinence   Sepsis 2/2 Staph Capitus Bacteremia in the setting of infected Port-A-Cath, ruled in and poA -Presents with one day of fever and fatigue, and is found to be febrile and tachycardic in ED reasurringly normal lactate, clear CXR,negative COVID-19 test, nourinary sxs, noabd pain or N/V/D,nomeningismus, noapparent cellulitis,no erythema or tenderness about theport in right chest that is functioning appropriately -Blood and urine cultures collected in ED and grew out MSSA Staph Species Staph Capitus which is Pan-Sensitive and Urine is growing out >100,000 CFU of Enterococcus which was pan-sensitive but likely a colonizatgion; repeat blood cultures on 02/11/2019 showed no growth to date at 3 days -She was given 1.5 liters of NS as boluses, and broad spectrum antibiotics initiatedwith IV Vancomycin, Cefepime and Flagyl; Will defer Abx Management to ID as I have consulted Dr. Megan Salon and he recommends continue IV vancomycin for now until further blood cultures are out and he has discontinued IV Cefepime and Flagyl; spoke with Dr. Prince Rome of infectious disease today who recommends stopping IV vancomycin and starting IV Ceftriaxone 2 g daily  for the next 12 days for completion course on 02/27/2019 -OPAT Orders to be placed and caseworker is in the process of arranging home health -IV fluids have now stopped -Echocardiogram was  ordered because of her suspected bacteremia and showed no vegetations or evidence of endocarditis -Repeated blood cultures 02/11/2019 showed no growth to date at 2 days -Continue to monitor temperature curve and cultures; currently she has now defervesced and T-max in last 24 hours of 99 -Dr. Alvy Bimler recommending Port-A-Cath extraction and has consulted interventional radiology for Port-A-Cath removal. -We will obtain PT and OT to evaluate and recommending Home Health PT -CaseWorker consulted for Assistance with OPAT and arranging Home Health and ensuring that patient can receive her IV Ceftriaxone at discharge  Enterococcus urinary tract colonization, present on admission -As above -Urinalysis grew out straw-colored urine, moderate leukocytes, rare bacteria, 11-20 WBCs, urine culture grew out greater than 100,000 colony-forming units of enterococcus faecalis which is pansensitive -ID was consulted and will defer antibiotic management to them as we have initiated broad-spectrum antibiotics on admission and this was continued.  And she will be continued on IV vancomycin pending further cultures  -Dr. Megan Salon feels that her urine may be colonized with enterococcus as she is showing no symptoms she was however having some urinary frequency and incontinence earlier prior to admission but I would agree with Dr. Hale Bogus assessment along with Dr. Prince Rome assessment that this is likely a colonization  Endometrial cancer -Undergoing treatment with chemotherapy and radiationbut her treatment had been placed on hold for over 2 weeks since her recent hospital discharge. -Dr. Alvy Bimler has canceled her next treatment next week pending further treatment this hospitalization -Continue gabapentin for associated neuropathy -We will consult her primary oncologist Dr. Alvy Bimler for further evaluation recommendations  Normocytic/Macrocytic Anemia -Hgb is 8.9 on admission, stable and secondary to cancer treatment    - Hemoglobin/hematocrit now dropped to 7.3/22.0 so Medical oncology is typing and screening transfusing 1 unit PRBCs for this patient -Repeat Hgb/Hct was 8.9/25.6 and is now 8.4/25.6 - Continue to monitor for signs and symptoms of bleeding; currently no overt bleeding noted - Checked anemia panel this AM and showed an iron level of 24, U IBC 171, TIBC 195, saturation ratios of 12%, ferritin level of 261, folate level 21.3, vitamin B12 1447 - Repeat CBC in a.m.  Metabolic Acidosis -Patient's CO2 is now 26 and anion gap is now 7 -Continue monitor trend continue with IV fluid hydration as above 75 mL per hour -Repeat CMP in a.m.  Hyponatremia, improved -Patient sodium on admission is 133 now improved back to 140 -We will discontinue IV fluid hydration -Continue to Monitor trend and repeat CMP in a.m.  Hypomagnesemia, improved -Patient's Mag Level this AM was 1.8 -Replete with IV mag sulfate 2 g again today -Continue to monitor and replete as necessary -Repeat magnesium level in a.m.  Hypokalemia -Patient's K+ was 3.4 and improved to 4.2 -Replete with po KCL 40 mEQ BID x2 doses yesterday -Continue to Monitor and Replete as Necessary -Repeat CMP in AM   Hyperglycemia -Likely reactive in the setting of infection however will need to rule out diabetes mellitus and obtain a hemoglobin A1c in the a.m. -Blood sugars on daily BMPs/CMP's have been ranging from 93-127 -Continue to monitor blood sugars carefully and if necessary will need to place on a sensitive NovoLog/scale insulin AC  DVT prophylaxis: Enoxaparin 40 mg subcu every 24h Code Status: FULL CODE Family Communication: Discussed with husband at bedside Disposition Plan: Home  health PT once patient has OPAT in place as well as received her infusion pump and have case manager check to see how much the IV ceftriaxone will be from her insurance; currently she is medically stable for discharge  Consultants:   Medical  Oncology  Infectious Diseases  Interventional Radiology   Procedures:  ECHOCARDIOGRAM  IMPRESSIONS    1. The left ventricle has hyperdynamic systolic function, with an ejection fraction of >65%. The cavity size was normal. There is mild concentric left ventricular hypertrophy. Left ventricular diastolic Doppler parameters are consistent with impaired  relaxation.  2. The right ventricle has normal systolic function. The cavity was normal. There is no increase in right ventricular wall thickness. Right ventricular systolic pressure is normal.  3. The aorta is normal unless otherwise noted.  SUMMARY   No vegatation was seen.  FINDINGS  Left Ventricle: The left ventricle has hyperdynamic systolic function, with an ejection fraction of >65%. The cavity size was normal. There is mild concentric left ventricular hypertrophy. Left ventricular diastolic Doppler parameters are consistent  with impaired relaxation. Normal left ventricular filling pressures  Right Ventricle: The right ventricle has normal systolic function. The cavity was normal. There is no increase in right ventricular wall thickness. Right ventricular systolic pressure is normal.  Left Atrium: Left atrial size was normal in size.  Right Atrium: Right atrial size was normal in size.  Interatrial Septum: No atrial level shunt detected by color flow Doppler.  Pericardium: There is no evidence of pericardial effusion.  Mitral Valve: The mitral valve is normal in structure. Mitral valve regurgitation is not visualized by color flow Doppler.  Tricuspid Valve: The tricuspid valve is normal in structure. Tricuspid valve regurgitation was not visualized by color flow Doppler.  Aortic Valve: The aortic valve is normal in structure. Aortic valve regurgitation was not visualized by color flow Doppler.  Pulmonic Valve: The pulmonic valve was normal in structure. Pulmonic valve regurgitation was not assessed by color  flow Doppler.  Aorta: The aorta is normal unless otherwise noted.  Venous: The inferior vena cava is normal in size with greater than 50% respiratory variability.    +--------------+--------++  LEFT VENTRICLE            +----------------+---------++ +--------------+--------++  Diastology                    PLAX 2D                   +----------------+---------++ +--------------+--------++  LV e' lateral:   8.38 cm/s    LVIDd:         4.60 cm    +----------------+---------++ +--------------+--------++  LV E/e' lateral: 11.0         LVIDs:         2.40 cm    +----------------+---------++ +--------------+--------++  LV e' medial:    9.57 cm/s    LV PW:         1.20 cm    +----------------+---------++ +--------------+--------++  LV E/e' medial:  9.6          LV IVS:        1.10 cm    +----------------+---------++ +--------------+--------++  LVOT diam:     1.90 cm    +--------------+--------++  LV SV:         77 ml      +--------------+--------++  LV SV Index:   40.84      +--------------+--------++  LVOT Area:     2.84 cm   +--------------+--------++                            +--------------+--------++  +---------------+----------++  RIGHT VENTRICLE              +---------------+----------++  RV Basal diam:  2.80 cm      +---------------+----------++  RV S prime:     19.00 cm/s   +---------------+----------++  TAPSE (M-mode): 2.9 cm       +---------------+----------++  +---------------+-------++-----------++  LEFT ATRIUM              Index         +---------------+-------++-----------++  LA diam:        3.60 cm  1.97 cm/m    +---------------+-------++-----------++  LA Vol (A2C):   33.0 ml  18.07 ml/m   +---------------+-------++-----------++  LA Vol (A4C):   43.5 ml  23.83 ml/m   +---------------+-------++-----------++  LA Biplane Vol: 38.4 ml  21.03 ml/m   +---------------+-------++-----------++  +------------+-----------++  AORTIC VALVE                +------------+-----------++  LVOT Vmax:   131.00 cm/s   +------------+-----------++  LVOT Vmean:  85.400 cm/s   +------------+-----------++  LVOT VTI:    0.264 m       +------------+-----------++   +-------------+-------++  AORTA                   +-------------+-------++  Ao Root diam: 2.80 cm   +-------------+-------++  +--------------+----------++  MITRAL VALVE                 +--------------+-------+ +--------------+----------++   SHUNTS                   MV Area (PHT): 10.25 cm     +--------------+-------+ +--------------+----------++   Systemic VTI:  0.26 m    MV PHT:        21.46 msec    +--------------+-------+ +--------------+----------++   Systemic Diam: 1.90 cm   MV Decel Time: 74 msec       +--------------+-------+ +--------------+----------++ +--------------+-----------++  MV E velocity: 91.80 cm/s    +--------------+-----------++  MV A velocity: 130.00 cm/s   +--------------+-----------++  MV E/A ratio:  0.71          +--------------+-----------++   Antimicrobials:  Anti-infectives (From admission, onward)   Start     Dose/Rate Route Frequency Ordered Stop   02/14/19 1400  cefTRIAXone (ROCEPHIN) 2 g in sodium chloride 0.9 % 100 mL IVPB     2 g 200 mL/hr over 30 Minutes Intravenous Every 24 hours 02/14/19 1334     02/14/19 0000  cefTRIAXone (ROCEPHIN) IVPB     2 g Intravenous Every 24 hours 02/14/19 1352 02/27/19 2359   02/11/19 2200  vancomycin (VANCOCIN) 1,250 mg in sodium chloride 0.9 % 250 mL IVPB  Status:  Discontinued     1,250 mg 166.7 mL/hr over 90 Minutes Intravenous Every 24 hours 02/11/19 0539 02/14/19 1334   02/11/19 1800  ceFEPIme (MAXIPIME) 2 g in sodium chloride 0.9 % 100 mL IVPB  Status:  Discontinued     2 g 200 mL/hr over 30 Minutes Intravenous Every 12 hours 02/11/19 0454 02/11/19 1212   02/11/19 1400  ceFEPIme (MAXIPIME) 2 g in sodium chloride 0.9 % 100 mL IVPB  Status:  Discontinued     2 g 200 mL/hr over 30 Minutes Intravenous  Every 8 hours 02/11/19 1212 02/12/19 1151   02/11/19 0500  metroNIDAZOLE (FLAGYL) IVPB 500 mg  Status:  Discontinued     500 mg 100 mL/hr over 60 Minutes Intravenous Every 8 hours 02/11/19 0442 02/12/19 1151  02/11/19 0500  vancomycin (VANCOCIN) 1,250 mg in sodium chloride 0.9 % 250 mL IVPB     1,250 mg 166.7 mL/hr over 90 Minutes Intravenous  Once 02/11/19 0454 02/11/19 0759   02/11/19 0400  ceFEPIme (MAXIPIME) 2 g in sodium chloride 0.9 % 100 mL IVPB     2 g 200 mL/hr over 30 Minutes Intravenous  Once 02/11/19 0354 02/11/19 0443     Subjective: Seen and examined at bedside and had no complaints.  States he slept like a "rock".  No chest pain, lightheadedness or dyspnea.  No other concerns or plans at the time and still waiting for case management to arrange home health and ensure that she gets her IV ceftriaxone for discharge along with an infusion pump.  Objective: Vitals:   02/14/19 0600 02/14/19 1601 02/14/19 1951 02/15/19 0556  BP: 131/80 133/77 134/73 (!) 141/80  Pulse: 90 97 95 84  Resp: 18 18 19 18   Temp: 98.7 F (37.1 C) 99 F (37.2 C) 98.2 F (36.8 C) 98.2 F (36.8 C)  TempSrc: Oral Oral Oral Oral  SpO2: 95% 96% 99% 95%  Weight:      Height:        Intake/Output Summary (Last 24 hours) at 02/15/2019 1322 Last data filed at 02/15/2019 0800 Gross per 24 hour  Intake 373.97 ml  Output 402 ml  Net -28.03 ml   Filed Weights   02/10/19 2112  Weight: 77.1 kg   Examination: Physical Exam:  Constitutional: WN/WD overweight Caucasian female in NAD and appears calm and comfortable Eyes: Llids and conjunctivae normal, sclerae anicteric  ENMT: External Ears, Nose appear normal. Grossly normal hearing. Mucous membranes are moist. Neck: Appears normal, supple, no cervical masses, normal ROM, no appreciable thyromegaly; no JVD Respiratory: Slightly diminished to auscultation bilaterally, no wheezing, rales, rhonchi or crackles. Normal respiratory effort and patient is not  tachypenic. No accessory muscle use.  Unlabored breathing Cardiovascular: RRR, no murmurs / rubs / gallops. S1 and S2 auscultated. No extremity edema. 2+ pedal pulses. No carotid bruits.  Abdomen: Soft, non-tender, mildly distended. Bowel sounds positive x4.  GU: Deferred. Musculoskeletal: No clubbing / cyanosis of digits/nails. No joint deformity upper and lower extremities.  Right arm PICC line in place Skin: No rashes, lesions, ulcers on a limited skin evaluation. No induration; Warm and dry.  Neurologic: CN 2-12 grossly intact with no focal deficits. Romberg sign and cerebellar reflexes not assessed.  Psychiatric: Normal judgment and insight. Alert and oriented x 3. Normal mood and appropriate affect.   Data Reviewed: I have personally reviewed following labs and imaging studies  CBC: Recent Labs  Lab 02/11/19 1413 02/12/19 0532 02/13/19 0529 02/13/19 2042 02/14/19 0401 02/15/19 0433  WBC 8.0 9.1 7.3  --  5.5 4.5  NEUTROABS 6.7 6.7 4.6  --  3.2 1.9  HGB 7.4* 7.5* 7.3* 8.9* 8.5* 8.4*  HCT 22.9* 22.4* 22.0* 25.6* 25.2* 25.6*  MCV 102.7* 102.8* 101.9*  --  97.7 98.8  PLT 218 210 199  --  202 [id number]   Basic Metabolic Panel: Recent Labs  Lab 02/11/19 0923 02/11/19 1413 02/12/19 0532 02/13/19 0529 02/14/19 0401 02/15/19 0433  NA 137 134* 132* 136 138 140  K 3.6 3.5 3.5 3.5 3.4* 4.2  CL 111 103 103 105 106 107  CO2 21* 21* 20* 23 24 26   GLUCOSE 99 127* 124* 93 99 98  BUN 11 11 11 11 9 8   CREATININE 0.77 0.81 0.82 0.72 0.64 0.53  CALCIUM 7.6*  8.2* 7.6* 8.3* 8.4* 8.7*  MG 1.7  --  1.6* 2.0 1.7 1.8  PHOS 4.0  --  2.9 3.0 3.0 3.2   GFR: Estimated Creatinine Clearance: 62 mL/min (by C-G formula based on SCr of 0.53 mg/dL). Liver Function Tests: Recent Labs  Lab 02/11/19 0923 02/12/19 0532 02/13/19 0529 02/14/19 0401 02/15/19 0433  AST 21 22 19 25 26   ALT 19 19 16 19 20   ALKPHOS 87 79 72 87 86  BILITOT 0.6 0.8 0.5 0.4 0.7  PROT 5.6* 5.7* 5.6* 5.8* 5.9*  ALBUMIN 3.1*  3.0* 2.8* 2.9* 3.0*   No results for input(s): LIPASE, AMYLASE in the last 168 hours. No results for input(s): AMMONIA in the last 168 hours. Coagulation Profile: Recent Labs  Lab 02/10/19 2116  INR 1.1   Cardiac Enzymes: No results for input(s): CKTOTAL, CKMB, CKMBINDEX, TROPONINI in the last 168 hours. BNP (last 3 results) No results for input(s): PROBNP in the last 8760 hours. HbA1C: No results for input(s): HGBA1C in the last 72 hours. CBG: No results for input(s): GLUCAP in the last 168 hours. Lipid Profile: No results for input(s): CHOL, HDL, LDLCALC, TRIG, CHOLHDL, LDLDIRECT in the last 72 hours. Thyroid Function Tests: No results for input(s): TSH, T4TOTAL, FREET4, T3FREE, THYROIDAB in the last 72 hours. Anemia Panel: Recent Labs    02/13/19 0529  VITAMINB12 1,447*  FOLATE 21.3  FERRITIN 261  TIBC 195*  IRON 24*  RETICCTPCT 2.6   Sepsis Labs: Recent Labs  Lab 02/10/19 2116  LATICACIDVEN 1.2    Recent Results (from the past 240 hour(s))  Culture, blood (Routine x 2)     Status: Abnormal   Collection Time: 02/10/19  9:16 PM   Specimen: BLOOD  Result Value Ref Range Status   Specimen Description   Final    BLOOD RIGHT WRIST Performed at [facility] [address], [place], [place] [address]    Special Requests   Final    BOTTLES DRAWN AEROBIC AND ANAEROBIC Blood Culture adequate volume Performed at [facility] [address], [place], [place] [address]    Culture  Setup Time   Final    GRAM POSITIVE COCCI AEROBIC BOTTLE ONLY CRITICAL RESULT CALLED TO, READ BACK BY AND VERIFIED WITH: DR CAMPBELL [id number] AT 1157 AM BY CM Performed at [facility], [place] [address], [place], [place] [address]    Culture STAPHYLOCOCCUS CAPITIS (A)  Final   Report Status 02/14/2019 FINAL  Final   Organism ID, Bacteria STAPHYLOCOCCUS CAPITIS  Final      Susceptibility   Staphylococcus capitis - MIC*    CIPROFLOXACIN <=0.5  SENSITIVE Sensitive     ERYTHROMYCIN 0.5 SENSITIVE Sensitive     GENTAMICIN <=0.5 SENSITIVE Sensitive     OXACILLIN <=0.25 SENSITIVE Sensitive     TETRACYCLINE <=1 SENSITIVE Sensitive     VANCOMYCIN 1 SENSITIVE Sensitive     TRIMETH/SULFA <=10 SENSITIVE Sensitive     CLINDAMYCIN <=0.25 SENSITIVE Sensitive     RIFAMPIN <=0.5 SENSITIVE Sensitive     Inducible Clindamycin NEGATIVE Sensitive     * STAPHYLOCOCCUS CAPITIS  Culture, blood (Routine x 2)     Status: Abnormal   Collection Time: 02/10/19  9:21 PM   Specimen: BLOOD RIGHT HAND  Result Value Ref Range Status   Specimen Description   Final    BLOOD RIGHT HAND Performed at [facility], [place] [address] [place], [place] [address]    Special Requests  Final    BOTTLES DRAWN AEROBIC AND ANAEROBIC Blood Culture adequate volume Performed at [facility] [address], [place], [place] [address]    Culture  Setup Time   Final    GRAM POSITIVE COCCI IN BOTH AEROBIC AND ANAEROBIC BOTTLES CRITICAL RESULT CALLED TO, READ BACK BY AND VERIFIED WITH: Sheffield Slider PharmD 17:00 02/11/19 ([email or web address])    Culture (A)  Final    STAPHYLOCOCCUS CAPITIS SUSCEPTIBILITIES PERFORMED ON PREVIOUS CULTURE WITHIN THE LAST 5 DAYS. Performed at [facility], [place] [address] [place], [place] [address]    Report Status 02/14/2019 FINAL  Final  Blood Culture ID Panel (Reflexed)     Status: Abnormal   Collection Time: 02/10/19  9:21 PM  Result Value Ref Range Status   Enterococcus species NOT DETECTED NOT DETECTED Final   Listeria monocytogenes NOT DETECTED NOT DETECTED Final   Staphylococcus species DETECTED (A) NOT DETECTED Final    Comment: Methicillin (oxacillin) susceptible coagulase negative staphylococcus. Possible blood culture contaminant (unless isolated from more than one blood culture draw or clinical case suggests pathogenicity). No antibiotic treatment is indicated for blood  culture contaminants. CRITICAL RESULT  CALLED TO, READ BACK BY AND VERIFIED WITH: Sheffield Slider PharmD 17:00 02/11/19 ([email or web address])    Staphylococcus aureus (BCID) NOT DETECTED NOT DETECTED Final   Methicillin resistance NOT DETECTED NOT DETECTED Final   Streptococcus species NOT DETECTED NOT DETECTED Final   Streptococcus agalactiae NOT DETECTED NOT DETECTED Final   Streptococcus pneumoniae NOT DETECTED NOT DETECTED Final   Streptococcus pyogenes NOT DETECTED NOT DETECTED Final   Acinetobacter baumannii NOT DETECTED NOT DETECTED Final   Enterobacteriaceae species NOT DETECTED NOT DETECTED Final   Enterobacter cloacae complex NOT DETECTED NOT DETECTED Final   Escherichia coli NOT DETECTED NOT DETECTED Final   Klebsiella oxytoca NOT DETECTED NOT DETECTED Final   Klebsiella pneumoniae NOT DETECTED NOT DETECTED Final   Proteus species NOT DETECTED NOT DETECTED Final   Serratia marcescens NOT DETECTED NOT DETECTED Final   Haemophilus influenzae NOT DETECTED NOT DETECTED Final   Neisseria meningitidis NOT DETECTED NOT DETECTED Final   Pseudomonas aeruginosa NOT DETECTED NOT DETECTED Final   Candida albicans NOT DETECTED NOT DETECTED Final   Candida glabrata NOT DETECTED NOT DETECTED Final   Candida krusei NOT DETECTED NOT DETECTED Final   Candida parapsilosis NOT DETECTED NOT DETECTED Final   Candida tropicalis NOT DETECTED NOT DETECTED Final    Comment: Performed at [facility], [address] [place], [place] [address]  SARS Coronavirus 2 [facility] order, Performed in [facility] hospital lab) Nasopharyngeal Nasopharyngeal Swab     Status: None   Collection Time: 02/10/19 11:23 PM   Specimen: Nasopharyngeal Swab  Result Value Ref Range Status   SARS Coronavirus 2 NEGATIVE NEGATIVE Final    Comment: (NOTE) If result is NEGATIVE SARS-CoV-2 target nucleic acids are NOT DETECTED. The SARS-CoV-2 RNA is generally detectable in upper and lower  respiratory specimens during the acute phase of infection. The lowest  concentration  of SARS-CoV-2 viral copies this assay can detect is 250  copies / mL. A negative result does not preclude SARS-CoV-2 infection  and should not be used as the sole basis for treatment or other  patient management decisions.  A negative result may occur with  improper specimen collection / handling, submission of specimen other  than nasopharyngeal swab, presence of viral mutation(s) within the  areas targeted by this assay, and inadequate number of viral copies  (<  250 copies / mL). A negative result must be combined with clinical  observations, patient history, and epidemiological information. If result is POSITIVE SARS-CoV-2 target nucleic acids are DETECTED. The SARS-CoV-2 RNA is generally detectable in upper and lower  respiratory specimens dur ing the acute phase of infection.  Positive  results are indicative of active infection with SARS-CoV-2.  Clinical  correlation with patient history and other diagnostic information is  necessary to determine patient infection status.  Positive results do  not rule out bacterial infection or co-infection with other viruses. If result is PRESUMPTIVE POSTIVE SARS-CoV-2 nucleic acids MAY BE PRESENT.   A presumptive positive result was obtained on the submitted specimen  and confirmed on repeat testing.  While 2019 novel coronavirus  (SARS-CoV-2) nucleic acids may be present in the submitted sample  additional confirmatory testing may be necessary for epidemiological  and / or clinical management purposes  to differentiate between  SARS-CoV-2 and other Sarbecovirus currently known to infect humans.  If clinically indicated additional testing with an alternate test  methodology [phone]) is advised. The SARS-CoV-2 RNA is generally  detectable in upper and lower respiratory sp ecimens during the acute  phase of infection. The expected result is Negative. Fact Sheet for Patients:  StrictlyIdeas.no Fact Sheet for Healthcare  Providers: BankingDealers.co.za This test is not yet approved or cleared by the [place] FDA and has been authorized for detection and/or diagnosis of SARS-CoV-2 by FDA under an Emergency Use Authorization (EUA).  This EUA will remain in effect (meaning this test can be used) for the duration of the COVID-19 declaration under Section 564(b)(1) of the Act, 21 U.S.C. section 360bbb-3(b)(1), unless the authorization is terminated or revoked sooner. Performed at [facility], [place] [address] [place], [place] [address]   Urine culture     Status: Abnormal   Collection Time: 02/11/19  2:00 AM   Specimen: Urine, Random  Result Value Ref Range Status   Specimen Description   Final    URINE, RANDOM Performed at [place] [address] [place], [place] [address]    Special Requests   Final    NONE Performed at [facility], [place] [address] [place], [place] [address]    Culture >=100,000 COLONIES/mL ENTEROCOCCUS FAECALIS (A)  Final   Report Status 02/12/2019 FINAL  Final   Organism ID, Bacteria ENTEROCOCCUS FAECALIS (A)  Final      Susceptibility   Enterococcus faecalis - MIC*    AMPICILLIN <=2 SENSITIVE Sensitive     LEVOFLOXACIN 2 SENSITIVE Sensitive     NITROFURANTOIN <=16 SENSITIVE Sensitive     VANCOMYCIN <=0.5 SENSITIVE Sensitive     * >=100,000 COLONIES/mL ENTEROCOCCUS FAECALIS  Culture, sputum-assessment     Status: None   Collection Time: 02/11/19  9:41 AM   Specimen: Sputum  Result Value Ref Range Status   Specimen Description SPUTUM  Final   Special Requests NONE  Final   Sputum evaluation   Final    Sputum specimen not acceptable for testing.  Please recollect.   NOTIFIED Pauline Good [id number] Performed at [facility], [place] [address] [place], [place] [address]    Report Status 02/11/2019 FINAL  Final  Culture, blood (routine x 2)     Status: None (Preliminary  result)   Collection Time: 02/11/19  6:32 PM   Specimen: BLOOD  Result Value Ref Range Status   Specimen Description   Final    BLOOD RIGHT ANTECUBITAL  Performed at [facility], [place] [address], [place], [place] [address]    Special Requests   Final    BOTTLES DRAWN AEROBIC ONLY Blood Culture adequate volume Performed at [place] [address], [place], [place] [address]    Culture   Final    NO GROWTH 4 DAYS Performed at [facility], [place] [address] [place], [place] [address]    Report Status PENDING  Incomplete  Culture, blood (routine x 2)     Status: None (Preliminary result)   Collection Time: 02/11/19  6:37 PM   Specimen: BLOOD  Result Value Ref Range Status   Specimen Description   Final    BLOOD LEFT ANTECUBITAL Performed at [place] [address], [place], [place] [address]    Special Requests   Final    BOTTLES DRAWN AEROBIC ONLY Blood Culture adequate volume Performed at [place] [address] [place] [address], [place], [place] [address]    Culture   Final    NO GROWTH 4 DAYS Performed at [facility], [place] [address], [place], [place] [address]    Report Status PENDING  Incomplete    Radiology Studies: Ir Removal Tun Access W/ [place] W/o [place] Mod Sed  Result Date: 02/13/2019 INDICATION: 73 year old female with a right IJ approach port catheter which was placed by interventional radiology (Dr. Kathlene Cote) on 11/06/2018. Unfortunately, the patient is currently immunocompromised on chemotherapy with bacteremia and concern for seeding of the port catheter. She presents for port removal and placement of a PICC so that she may continue her chemotherapy. EXAM: REMOVAL RIGHT IJ VEIN PORT-A-CATH MEDICATIONS: None ANESTHESIA/SEDATION: None FLUOROSCOPY TIME:  None COMPLICATIONS: None immediate. PROCEDURE: Informed written consent was obtained from the patient after a thorough discussion of the  procedural risks, benefits and alternatives. All questions were addressed. Maximal Sterile Barrier Technique was utilized including caps, mask, sterile gowns, sterile gloves, sterile drape, hand hygiene and skin antiseptic. A timeout was performed prior to the initiation of the procedure. The right chest was prepped and draped in a sterile fashion. Lidocaine was utilized for local anesthesia. An incision was made over the previously healed surgical incision. Utilizing blunt dissection, the port catheter and reservoir were removed from the underlying subcutaneous tissue in their entirety. Securing sutures were also removed. The pocket was irrigated with a copious amount of sterile normal saline. The pocket was closed with interrupted 3-0 Vicryl stitches. The subcutaneous tissue was closed with 3-0 Vicryl interrupted subcutaneous stitches. A 4-0 Vicryl running subcuticular stitch was utilized to approximate the skin. Dermabond was applied. IMPRESSION: Successful right IJ vein Port-A-Cath explant. No evidence of infection within the reservoir pocket. Electronically Signed   By: Jacqulynn Cadet M.D.   On: 02/13/2019 17:08   Ir Picc Placement Right >5 Yrs Inc Img Guide  Result Date: 02/13/2019 INDICATION: 73 year old female with suspected bacterial seeding of her port catheter and a continued need for chemotherapy. She presents for port catheter removal and simultaneous PICC placement. EXAM: PICC LINE PLACEMENT WITH ULTRASOUND AND FLUOROSCOPIC GUIDANCE MEDICATIONS: None. ANESTHESIA/SEDATION: None. FLUOROSCOPY TIME:  Fluoroscopy Time: 0 minutes 12 seconds (3 mGy). COMPLICATIONS: None immediate. PROCEDURE: The patient was advised of the possible risks and complications and agreed to undergo the procedure. The patient was then brought to the angiographic suite for the procedure. The right arm was prepped with chlorhexidine, draped in the usual sterile fashion using maximum barrier technique (cap and mask, sterile  gown, sterile  gloves, large sterile sheet, hand hygiene and cutaneous antisepsis) and infiltrated locally with 1% Lidocaine. Ultrasound demonstrated patency of the right brachial vein, and this was documented with an image. Under real-time ultrasound guidance, this vein was accessed with a 21 gauge micropuncture needle and image documentation was performed. A 0.018 wire was introduced in to the vein. Over this, a 5 [place] single lumen power-injectable PICC was advanced to the lower SVC/right atrial junction. Fluoroscopy during the procedure and fluoro spot radiograph confirms appropriate catheter position. The catheter was flushed and covered with a sterile dressing. Catheter length: 36 cm IMPRESSION: Successful right arm Power PICC line placement with ultrasound and fluoroscopic guidance. The catheter is ready for use. Electronically Signed   By: Jacqulynn Cadet M.D.   On: 02/13/2019 17:09   Scheduled Meds:  Chlorhexidine Gluconate Cloth  6 each Topical Daily   enoxaparin (LOVENOX) injection  40 mg Subcutaneous Q24H   gabapentin  300 mg Oral BID   sodium chloride flush  3 mL Intravenous Q12H   Continuous Infusions:  sodium chloride 75 mL/hr at 02/13/19 1600   cefTRIAXone (ROCEPHIN)  IV 2 g (02/14/19 1427)    LOS: 4 days   Kerney Elbe, DO [facility] PAGER is on [place]  If 7PM-7AM, please contact night-coverage [email or web address] Password [email or web address] 02/15/2019, 1:22 PM

## 2019-02-15 NOTE — Progress Notes (Signed)
Patient ID: Zoe Kim, female   DOB: [birth year], 73 y.o.   MRN: [id number]         [facility] for Infectious Disease  Date of Admission:  02/10/2019          Day 6 ABX (day 2 of rocephin) ASSESSMENT: The patient is a 73 year old white female with uterine cancer status post recent chemotherapy and radiation now admitted with fever and coag negative staph bacteremia concerning for Port-A-Cath infection.  PLAN: 1. Coag negative staph bacteremia-  All of her initial blood cxs have now been confirmed as MS-CoNS (capitus), so changed vancomycin to rocephin yesterday.The patient's Port-A-Cath was removed Friday.  Her repeat blood cultures from February 11, 2019 remain unrevealing thus far.  Her transthoracic echocardiogram showed no evidence of endocarditis.  She now has a PICC line that is intact and was placed after greater than 48 hours of negative blood cultures.  I would anticipate a 2-week duration of antibiotic treatment beginning from the time of her Port-A-Cath removal.  Tentative discharge date for antibiotics would be February 27, 2019. OPAT orders written Saturday but CM unable to obtain approval for her ABX over the weekend, so anticipate d/c Monday once infusion company can assist with coordinating d/c. Will be OK to give rocephin dose in AM to assist in facilitating d/c home. 2. Fever- patient is now been afebrile for the last 2.5 days with empiric antibiotics.  Her Port-A-Cath has now been removed in the interim.  Repeat the patient's blood cultures x2 for any fevers greater than 100.5.  Source of her fever does appear to be her coag negative staph bacteremia at this time. 3. Uterine cancer- patient is recently been receiving both chemotherapy and XRT.  Per her report, she believes that she does still have 2 cycles of chemotherapy remaining.  Most recently, her chemotherapy has been delayed due to anemia.  Will defer reinitiation of chemotherapy to her oncologist.  As her PICC  line was placed during an episode of bacteremia, if this is site is to be used for chemotherapy, it would best be done with exchange of her PICC line over a guidewire to lower the risk of recurrence of her infection.  Principal Problem:   Fever Active Problems:   Endometrial cancer ([place])   Peripheral neuropathy due to chemotherapy ([place])   Anemia due to antineoplastic chemotherapy   Coagulase negative Staphylococcus bacteremia   Urinary incontinence   Scheduled Meds: . Chlorhexidine Gluconate Cloth  6 each Topical Daily  . enoxaparin (LOVENOX) injection  40 mg Subcutaneous Q24H  . gabapentin  300 mg Oral BID  . sodium chloride flush  3 mL Intravenous Q12H   Continuous Infusions: . sodium chloride 75 mL/hr at 02/13/19 1600  . cefTRIAXone (ROCEPHIN)  IV 2 g (02/15/19 1524)   PRN Meds:.acetaminophen **OR** acetaminophen, guaiFENesin-dextromethorphan, Melatonin, morphine injection, ondansetron **OR** ondansetron (ZOFRAN) IV, polyethylene glycol, sodium chloride flush, traMADol   SUBJECTIVE: Pt continues to feel stronger and actually ambulated down the hallways for the first time in her admission today. Appetite has been improving as well. She is anxious to be d/c'ed home soon.  Review of Systems: Review of Systems  Constitutional: Positive for fever and malaise/fatigue. Negative for chills, diaphoresis and weight loss.  HENT: Negative for congestion and sore throat.   Respiratory: Negative for cough, sputum production and shortness of breath.   Cardiovascular: Negative for chest pain.  Gastrointestinal: Positive for constipation. Negative for abdominal pain, diarrhea, nausea and vomiting.  Genitourinary: Positive  for frequency. Negative for dysuria and urgency.  Musculoskeletal: Negative for back pain, joint pain and myalgias.  Neurological: Negative for headaches.    Allergies  Allergen Reactions  . Sulfa Antibiotics     UNSPECIFIED REACTION     OBJECTIVE: Vitals:    02/14/19 1601 02/14/19 1951 02/15/19 0556 02/15/19 1356  BP: 133/77 134/73 (!) 141/80 122/73  Pulse: 97 95 84 91  Resp: '18 19 18 18  '$ Temp: 99 F (37.2 C) 98.2 F (36.8 C) 98.2 F (36.8 C) 98.2 F (36.8 C)  TempSrc: Oral Oral Oral Oral  SpO2: 96% 99% 95% 96%  Weight:      Height:       Body mass index is 29.18 kg/m. Physical Exam Lines: RT arm PICC Gen: pleasant, NAD, A&Ox 3 Head: NCAT, +alopecia, no temporal wasting evident EENT: PERRL, EOMI, MMM, adequate dentition Neck: supple, no JVD CV: NRRR, no murmurs evident, bandage at former RT chest portocath site Pulm: CTA bilaterally, no wheeze or retractions Abd: soft, NTND, +BS Extrems: no LE edema, 2+ pulses Skin: no rashes, poor skin turgor with skin tenting noted to both legs Neuro: CN II-XII grossly intact, no focal neurologic deficits appreciated, gait was not assessed, A&Ox 3  Lab Results Lab Results  Component Value Date   WBC 4.5 02/15/2019   HGB 8.4 (L) 02/15/2019   HCT 25.6 (L) 02/15/2019   MCV 98.8 02/15/2019   PLT 228 02/15/2019    Lab Results  Component Value Date   CREATININE 0.53 02/15/2019   BUN 8 02/15/2019   NA 140 02/15/2019   K 4.2 02/15/2019   CL 107 02/15/2019   CO2 26 02/15/2019    Lab Results  Component Value Date   ALT 20 02/15/2019   AST 26 02/15/2019   ALKPHOS 86 02/15/2019   BILITOT 0.7 02/15/2019     Microbiology: Recent Results (from the past 240 hour(s))  Culture, blood (Routine x 2)     Status: Abnormal   Collection Time: 02/10/19  9:16 PM   Specimen: BLOOD  Result Value Ref Range Status   Specimen Description   Final    BLOOD RIGHT WRIST Performed at [facility], [address] [place], [place] [address]    Special Requests   Final    BOTTLES DRAWN AEROBIC AND ANAEROBIC Blood Culture adequate volume Performed at [place] [address] [place], [place] [address]    Culture  Setup Time   Final    GRAM POSITIVE COCCI  AEROBIC BOTTLE ONLY CRITICAL RESULT CALLED TO, READ BACK BY AND VERIFIED WITH: DR CAMPBELL [id number] AT 1157 AM BY CM Performed at [facility], [place] [address], [place], [place] [address]    Culture STAPHYLOCOCCUS CAPITIS (A)  Final   Report Status 02/14/2019 FINAL  Final   Organism ID, Bacteria STAPHYLOCOCCUS CAPITIS  Final      Susceptibility   Staphylococcus capitis - MIC*    CIPROFLOXACIN <=0.5 SENSITIVE Sensitive     ERYTHROMYCIN 0.5 SENSITIVE Sensitive     GENTAMICIN <=0.5 SENSITIVE Sensitive     OXACILLIN <=0.25 SENSITIVE Sensitive     TETRACYCLINE <=1 SENSITIVE Sensitive     VANCOMYCIN 1 SENSITIVE Sensitive     TRIMETH/SULFA <=10 SENSITIVE Sensitive     CLINDAMYCIN <=0.25 SENSITIVE Sensitive     RIFAMPIN <=0.5 SENSITIVE Sensitive     Inducible Clindamycin NEGATIVE Sensitive     * STAPHYLOCOCCUS CAPITIS  Culture, blood (Routine x 2)  Status: Abnormal   Collection Time: 02/10/19  9:21 PM   Specimen: BLOOD RIGHT HAND  Result Value Ref Range Status   Specimen Description   Final    BLOOD RIGHT HAND Performed at [facility], [address] [place], [place] [address]    Special Requests   Final    BOTTLES DRAWN AEROBIC AND ANAEROBIC Blood Culture adequate volume Performed at [place] [address] [place], [place] [address]    Culture  Setup Time   Final    GRAM POSITIVE COCCI IN BOTH AEROBIC AND ANAEROBIC BOTTLES CRITICAL RESULT CALLED TO, READ BACK BY AND VERIFIED WITH: Sheffield Slider PharmD 17:00 02/11/19 ([email or web address])    Culture (A)  Final    STAPHYLOCOCCUS CAPITIS SUSCEPTIBILITIES PERFORMED ON PREVIOUS CULTURE WITHIN THE LAST 5 DAYS. Performed at [facility], [place] [address], [place], [place] [address]    Report Status 02/14/2019 FINAL  Final  Blood Culture ID Panel (Reflexed)     Status: Abnormal   Collection Time: 02/10/19  9:21 PM  Result Value Ref Range Status   Enterococcus species NOT DETECTED NOT DETECTED Final    Listeria monocytogenes NOT DETECTED NOT DETECTED Final   Staphylococcus species DETECTED (A) NOT DETECTED Final    Comment: Methicillin (oxacillin) susceptible coagulase negative staphylococcus. Possible blood culture contaminant (unless isolated from more than one blood culture draw or clinical case suggests pathogenicity). No antibiotic treatment is indicated for blood  culture contaminants. CRITICAL RESULT CALLED TO, READ BACK BY AND VERIFIED WITH: Sheffield Slider PharmD 17:00 02/11/19 ([email or web address])    Staphylococcus aureus (BCID) NOT DETECTED NOT DETECTED Final   Methicillin resistance NOT DETECTED NOT DETECTED Final   Streptococcus species NOT DETECTED NOT DETECTED Final   Streptococcus agalactiae NOT DETECTED NOT DETECTED Final   Streptococcus pneumoniae NOT DETECTED NOT DETECTED Final   Streptococcus pyogenes NOT DETECTED NOT DETECTED Final   Acinetobacter baumannii NOT DETECTED NOT DETECTED Final   Enterobacteriaceae species NOT DETECTED NOT DETECTED Final   Enterobacter cloacae complex NOT DETECTED NOT DETECTED Final   Escherichia coli NOT DETECTED NOT DETECTED Final   Klebsiella oxytoca NOT DETECTED NOT DETECTED Final   Klebsiella pneumoniae NOT DETECTED NOT DETECTED Final   Proteus species NOT DETECTED NOT DETECTED Final   Serratia marcescens NOT DETECTED NOT DETECTED Final   Haemophilus influenzae NOT DETECTED NOT DETECTED Final   Neisseria meningitidis NOT DETECTED NOT DETECTED Final   Pseudomonas aeruginosa NOT DETECTED NOT DETECTED Final   Candida albicans NOT DETECTED NOT DETECTED Final   Candida glabrata NOT DETECTED NOT DETECTED Final   Candida krusei NOT DETECTED NOT DETECTED Final   Candida parapsilosis NOT DETECTED NOT DETECTED Final   Candida tropicalis NOT DETECTED NOT DETECTED Final    Comment: Performed at [facility], [address] [place], [place] [address]  SARS Coronavirus 2 [facility] order, Performed in [facility] hospital lab) Nasopharyngeal  Nasopharyngeal Swab     Status: None   Collection Time: 02/10/19 11:23 PM   Specimen: Nasopharyngeal Swab  Result Value Ref Range Status   SARS Coronavirus 2 NEGATIVE NEGATIVE Final    Comment: (NOTE) If result is NEGATIVE SARS-CoV-2 target nucleic acids are NOT DETECTED. The SARS-CoV-2 RNA is generally detectable in upper and lower  respiratory specimens during the acute phase of infection. The lowest  concentration of SARS-CoV-2 viral copies this assay can detect is 250  copies / mL. A negative result does not preclude SARS-CoV-2 infection  and  should not be used as the sole basis for treatment or other  patient management decisions.  A negative result may occur with  improper specimen collection / handling, submission of specimen other  than nasopharyngeal swab, presence of viral mutation(s) within the  areas targeted by this assay, and inadequate number of viral copies  (<250 copies / mL). A negative result must be combined with clinical  observations, patient history, and epidemiological information. If result is POSITIVE SARS-CoV-2 target nucleic acids are DETECTED. The SARS-CoV-2 RNA is generally detectable in upper and lower  respiratory specimens dur ing the acute phase of infection.  Positive  results are indicative of active infection with SARS-CoV-2.  Clinical  correlation with patient history and other diagnostic information is  necessary to determine patient infection status.  Positive results do  not rule out bacterial infection or co-infection with other viruses. If result is PRESUMPTIVE POSTIVE SARS-CoV-2 nucleic acids MAY BE PRESENT.   A presumptive positive result was obtained on the submitted specimen  and confirmed on repeat testing.  While 2019 novel coronavirus  (SARS-CoV-2) nucleic acids may be present in the submitted sample  additional confirmatory testing may be necessary for epidemiological  and / or clinical management purposes  to differentiate between   SARS-CoV-2 and other Sarbecovirus currently known to infect humans.  If clinically indicated additional testing with an alternate test  methodology [phone]) is advised. The SARS-CoV-2 RNA is generally  detectable in upper and lower respiratory sp ecimens during the acute  phase of infection. The expected result is Negative. Fact Sheet for Patients:  StrictlyIdeas.no Fact Sheet for Healthcare Providers: BankingDealers.co.za This test is not yet approved or cleared by the [place] FDA and has been authorized for detection and/or diagnosis of SARS-CoV-2 by FDA under an Emergency Use Authorization (EUA).  This EUA will remain in effect (meaning this test can be used) for the duration of the COVID-19 declaration under Section 564(b)(1) of the Act, 21 U.S.C. section 360bbb-3(b)(1), unless the authorization is terminated or revoked sooner. Performed at [facility], [place] [address] [place], [place] [address]   Urine culture     Status: Abnormal   Collection Time: 02/11/19  2:00 AM   Specimen: Urine, Random  Result Value Ref Range Status   Specimen Description   Final    URINE, RANDOM Performed at [place] [address], [place], [place] [address]    Special Requests   Final    NONE Performed at [facility], [place] [address] [place], [place] [address]    Culture >=100,000 COLONIES/mL ENTEROCOCCUS FAECALIS (A)  Final   Report Status 02/12/2019 FINAL  Final   Organism ID, Bacteria ENTEROCOCCUS FAECALIS (A)  Final      Susceptibility   Enterococcus faecalis - MIC*    AMPICILLIN <=2 SENSITIVE Sensitive     LEVOFLOXACIN 2 SENSITIVE Sensitive     NITROFURANTOIN <=16 SENSITIVE Sensitive     VANCOMYCIN <=0.5 SENSITIVE Sensitive     * >=100,000 COLONIES/mL ENTEROCOCCUS FAECALIS  Culture, sputum-assessment     Status: None   Collection Time: 02/11/19  9:41 AM   Specimen:  Sputum  Result Value Ref Range Status   Specimen Description SPUTUM  Final   Special Requests NONE  Final   Sputum evaluation   Final    Sputum specimen not acceptable for testing.  Please recollect.   NOTIFIED Pauline Good [phone] Performed at [facility], [place] [address] [place],  [place] [address]    Report Status 02/11/2019 FINAL  Final  Culture, blood (routine x 2)     Status: None (Preliminary result)   Collection Time: 02/11/19  6:32 PM   Specimen: BLOOD  Result Value Ref Range Status   Specimen Description   Final    BLOOD RIGHT ANTECUBITAL Performed at [facility] [place] [address], [place], [place] [address]    Special Requests   Final    BOTTLES DRAWN AEROBIC ONLY Blood Culture adequate volume Performed at [place] [address], [place], [place] [address]    Culture   Final    NO GROWTH 4 DAYS Performed at [facility], [place] [address] [place], [place] [address]    Report Status PENDING  Incomplete  Culture, blood (routine x 2)     Status: None (Preliminary result)   Collection Time: 02/11/19  6:37 PM   Specimen: BLOOD  Result Value Ref Range Status   Specimen Description   Final    BLOOD LEFT ANTECUBITAL Performed at [place] [facility] [address] [place], [place] [address]    Special Requests   Final    BOTTLES DRAWN AEROBIC ONLY Blood Culture adequate volume Performed at [place] [address], [place], [place] [address]    Culture   Final    NO GROWTH 4 DAYS Performed at [facility], [place] [address], [place],  [address]    Report Status PENDING  Incomplete   OPAT ORDERS:  Diagnosis: MS-CoNS BSI/portocath infection  Culture Result: MS-CoNS (capitus)  Allergies  Allergen Reactions  . Sulfa Antibiotics     UNSPECIFIED REACTION     Discharge antibiotics: Rocephin 2 gm IV daily Duration: 2 weeks from time of portocath  explantation End Date: 02/27/2019  [facility] Care and Maintenance Per Protocol _X_ Please pull PIC at completion of IV antibiotics __ Please leave PIC in place until doctor has seen patient or been notified  Labs weekly while on IV antibiotics: _X_ CBC with differential _X_ BMP __ BMP TWICE WEEKLY** __ CMP __ CRP __ ESR __ Vancomycin trough  Fax weekly labs to [phone]  Clinic Follow Up Appt: Call clinic for f/u in 1-2 weeks with Dr. Lilly Cove, [place] for [place] [phone] pager    02/15/2019, 4:32 PM

## 2019-02-15 NOTE — Progress Notes (Signed)
Pt ambulated in hall with front wheel walker with minimal assistance. Pt tolerated well.

## 2019-02-16 ENCOUNTER — Inpatient Hospital Stay: Payer: PPO

## 2019-02-16 ENCOUNTER — Telehealth: Payer: Self-pay | Admitting: Oncology

## 2019-02-16 ENCOUNTER — Inpatient Hospital Stay: Payer: PPO | Admitting: Hematology and Oncology

## 2019-02-16 DIAGNOSIS — B957 Other staphylococcus as the cause of diseases classified elsewhere: Secondary | ICD-10-CM

## 2019-02-16 LAB — CULTURE, BLOOD (ROUTINE X 2)
Culture: NO GROWTH
Culture: NO GROWTH
Special Requests: ADEQUATE
Special Requests: ADEQUATE

## 2019-02-16 NOTE — Progress Notes (Signed)
PROGRESS NOTE    Zoe Kim  [id number] DOB: [birth year] DOA: 02/10/2019 PCP: Lujean Amel, MD   Brief Narrative:  Patient is a 73 year old female with a past medical history significant for but not limited to endometrial cancer currently undergoing treatment with radiation and chemotherapy which is complicated by peripheral neuropathy and anemia who presented to the ED with a day of fevers, chills, fatigue and generalized weakness.  She reports that she woke up on the morning of 02/10/2019 with fevers chills and malaise and intermittently over the last month or so she has been having similar symptoms but had been worsening recently.  Denied any sick contacts.  In the ED she was found to be febrile and saturating on room air with tachycardia and a stable blood pressure. COVID Testing was negative.  Urinalysis showed some rare bacteria, moderate leukocytes, 11-20 WBCs per high-power field.    In the ED she was given normal saline 1.5 L boluses and started on broad-spectrum antibiotics with IV vancomycin IV cefepime.  Further work-up reveals a MSSA Gram Postive Cocci Bacteremia of Staph Capitis and urine is growing out greater than 100,000 colony-forming units of Enterococcus faecalis.  Infectious disease was consulted for further evaluation recommendations and an echocardiogram has been ordered for her bacteremia.  Infectious Diseases was consulted to adjust her antibiotic regimen currently and further work-up has been pursued and blood cultures were repeated which are showing NGTD at 3 Days.  Likely source of her infection is her Port-A-Cath and patient has elected for Port-A-Cath to be removed even though ID states that she can keep it.  I spoke with Dr. Prince Rome today and he recommends discontinuing vancomycin given the pan sensitivity for her Staph Capitis infection and changing to IV ceftriaxone.  Interventional radiology was consulted for Port-A-Cath removal and since this is the patient's  second bacteremia since July.  She is typed and screened and transfused 1 unit of PRBCs given her anemia.  She is improving daily and can likely be discharged home once her OPAT are in place along with equipment and this may be Monday now.  Currently medically stable and awaiting for home health to be set up and case management have been extremely hard time setting this up currently.  All home health agencies have declined to provide services with reasons ranging from staffing to patient being outside of the surface area.  Cannot safely send today until home health has been set up given that she needs teaching for infusion pump; will need to discuss with case manager about further options about disposition.  Assessment & Plan:   Principal Problem:   Coagulase negative Staphylococcus bacteremia Active Problems:   Endometrial cancer ([place])   Peripheral neuropathy due to chemotherapy ([place])   Anemia due to antineoplastic chemotherapy   Urinary incontinence   Sepsis 2/2 Staph Capitus Bacteremia in the setting of infected Port-A-Cath, ruled in and poA -Presents with one day of fever and fatigue, and is found to be febrile and tachycardic in ED reasurringly normal lactate, clear CXR,negative COVID-19 test, nourinary sxs, noabd pain or N/V/D,nomeningismus, noapparent cellulitis,no erythema or tenderness about theport in right chest that is functioning appropriately -Blood and urine cultures collected in ED and grew out MSSA Staph Species Staph Capitus which is Pan-Sensitive and Urine is growing out >100,000 CFU of Enterococcus which was pan-sensitive but likely a colonizatgion; repeat blood cultures on 02/11/2019 showed no growth to date at 3 days -She was given 1.5 liters of NS as boluses, and  broad spectrum antibiotics initiatedwith IV Vancomycin, Cefepime and Flagyl; Will defer Abx Management to ID as I have consulted Dr. Megan Salon and he recommends continue IV vancomycin for now until further  blood cultures are out and he has discontinued IV Cefepime and Flagyl; spoke with Dr. Prince Rome of infectious disease today who recommends stopping IV vancomycin and starting IV Ceftriaxone 2 g daily for the next 12 days for completion course on 02/27/2019 -OPAT Orders to be placed and caseworker is in the process of arranging home health -IV fluids have now stopped -Echocardiogram was ordered because of her suspected bacteremia and showed no vegetations or evidence of endocarditis -Repeated blood cultures 02/11/2019 showed no growth to date at 2 days -Continue to monitor temperature curve and cultures; currently she has now defervesced and T-max in last 24 hours of 99 -Dr. Alvy Bimler recommending Port-A-Cath extraction and has consulted interventional radiology for Port-A-Cath removal. -We will obtain PT and OT to evaluate and recommending [place] consulted for Assistance with OPAT and arranging Home Health and ensuring that patient can receive her IV Ceftriaxone at discharge; currently caseworkers having extremely hard time arranging home health services that started the [place], [place] area; cannot safely discharge home today given that that she needs IV infusion teaching if home health services cannot be set up  Enterococcus urinary tract colonization, present on admission -As above -Urinalysis grew out straw-colored urine, moderate leukocytes, rare bacteria, 11-20 WBCs, urine culture grew out greater than 100,000 colony-forming units of enterococcus faecalis which is pansensitive -ID was consulted and will defer antibiotic management to them as we have initiated broad-spectrum antibiotics on admission and this was continued.  And she will be continued on IV vancomycin pending further cultures  -Dr. Megan Salon feels that her urine may be colonized with enterococcus as she is showing no symptoms she was however having some urinary frequency and incontinence earlier prior to admission  but I would agree with Dr. Hale Bogus assessment along with Dr. Prince Rome assessment that this is likely a colonization  Endometrial cancer -Undergoing treatment with chemotherapy and radiationbut her treatment had been placed on hold for over 2 weeks since her recent hospital discharge. -Dr. Alvy Bimler has canceled her next treatment next week pending further treatment this hospitalization -Continue gabapentin for associated neuropathy -We will consult her primary oncologist Dr. Alvy Bimler for further evaluation recommendations  Normocytic/Macrocytic Anemia -Hgb is 8.9 on admission, stable and secondary to cancer treatment  - Hemoglobin/hematocrit now dropped to 7.3/22.0 so Medical oncology is typing and screening transfusing 1 unit PRBCs for this patient -Repeat Hgb/Hct was 8.9/25.6 and is now 8.4/25.6 yesterday - Continue to monitor for signs and symptoms of bleeding; currently no overt bleeding noted - Checked anemia panel this AM and showed an iron level of 24, U IBC 171, TIBC 195, saturation ratios of 12%, ferritin level of 261, folate level 21.3, vitamin B12 1447 - Repeat CBC in a.m.  Metabolic Acidosis -Patient's CO2 is now 26 and anion gap is now 7, yesterday -Continue monitor trend continue with IV fluid hydration as above 75 mL per hour -Repeat CMP in a.m.  Hyponatremia, improved -Patient sodium on admission is 133 now improved back to 140 yesterday -We will discontinue IV fluid hydration -Continue to Monitor trend and repeat CMP in a.m.  Hypomagnesemia, improved -Patient's Mag Level this AM was 1.8 yesterday -Continue to monitor and replete as necessary -Repeat magnesium level in a.m.  Hypokalemia -Patient's K+ was 3.4 and improved to 4.2 yesterday -Continue to Monitor  and Replete as Necessary -Repeat CMP in AM   Hyperglycemia -Likely reactive in the setting of infection however will need to rule out diabetes mellitus and obtain a hemoglobin A1c in the a.m. -Blood  sugars on daily BMPs/CMP's have been ranging from 93-127 -Continue to monitor blood sugars carefully and if necessary will need to place on a sensitive NovoLog/scale insulin AC  DVT prophylaxis: Enoxaparin 40 mg subcu every 24h Code Status: FULL CODE Family Communication: Discussed with husband at bedside Disposition Plan: Home health PT once patient has OPAT in place as well as received her infusion pump and have case manager check to see how much the IV ceftriaxone will be from her insurance; currently she is medically stable for discharge but still needs arrangement for home health services will need to discuss with case management again further tomorrow about disposition given that all home and pregnancies have declined patient services.  Patient needs an infusion pump and RN to come out to help with her infusions and she also needs physical therapy with home health including PT and OT  Consultants:   Medical Oncology  Infectious Diseases  Interventional Radiology   Procedures:  ECHOCARDIOGRAM  IMPRESSIONS    1. The left ventricle has hyperdynamic systolic function, with an ejection fraction of >65%. The cavity size was normal. There is mild concentric left ventricular hypertrophy. Left ventricular diastolic Doppler parameters are consistent with impaired  relaxation.  2. The right ventricle has normal systolic function. The cavity was normal. There is no increase in right ventricular wall thickness. Right ventricular systolic pressure is normal.  3. The aorta is normal unless otherwise noted.  SUMMARY   No vegatation was seen.  FINDINGS  Left Ventricle: The left ventricle has hyperdynamic systolic function, with an ejection fraction of >65%. The cavity size was normal. There is mild concentric left ventricular hypertrophy. Left ventricular diastolic Doppler parameters are consistent  with impaired relaxation. Normal left ventricular filling pressures  Right Ventricle: The  right ventricle has normal systolic function. The cavity was normal. There is no increase in right ventricular wall thickness. Right ventricular systolic pressure is normal.  Left Atrium: Left atrial size was normal in size.  Right Atrium: Right atrial size was normal in size.  Interatrial Septum: No atrial level shunt detected by color flow Doppler.  Pericardium: There is no evidence of pericardial effusion.  Mitral Valve: The mitral valve is normal in structure. Mitral valve regurgitation is not visualized by color flow Doppler.  Tricuspid Valve: The tricuspid valve is normal in structure. Tricuspid valve regurgitation was not visualized by color flow Doppler.  Aortic Valve: The aortic valve is normal in structure. Aortic valve regurgitation was not visualized by color flow Doppler.  Pulmonic Valve: The pulmonic valve was normal in structure. Pulmonic valve regurgitation was not assessed by color flow Doppler.  Aorta: The aorta is normal unless otherwise noted.  Venous: The inferior vena cava is normal in size with greater than 50% respiratory variability.    +--------------+--------++ LEFT VENTRICLE         +----------------+---------++ +--------------+--------++ Diastology                PLAX 2D                +----------------+---------++ +--------------+--------++ LV e' lateral:  8.38 cm/s LVIDd:        4.60 cm  +----------------+---------++ +--------------+--------++ LV E/e' lateral:11.0      LVIDs:        2.40 cm  +----------------+---------++ +--------------+--------++  LV e' medial:   9.57 cm/s LV PW:        1.20 cm  +----------------+---------++ +--------------+--------++ LV E/e' medial: 9.6       LV IVS:       1.10 cm  +----------------+---------++ +--------------+--------++ LVOT diam:    1.90 cm  +--------------+--------++ LV SV:        77 ml    +--------------+--------++ LV SV Index:  40.84     +--------------+--------++ LVOT Area:    2.84 cm +--------------+--------++                        +--------------+--------++  +---------------+----------++ RIGHT VENTRICLE           +---------------+----------++ RV Basal diam: 2.80 cm    +---------------+----------++ RV S prime:    19.00 cm/s +---------------+----------++ TAPSE (M-mode):2.9 cm     +---------------+----------++  +---------------+-------++-----------++ LEFT ATRIUM           Index       +---------------+-------++-----------++ LA diam:       3.60 cm1.97 cm/m  +---------------+-------++-----------++ LA Vol (A2C):  33.0 ml18.07 ml/m +---------------+-------++-----------++ LA Vol (A4C):  43.5 ml23.83 ml/m +---------------+-------++-----------++ LA Biplane Vol:38.4 ml21.03 ml/m +---------------+-------++-----------++  +------------+-----------++ AORTIC VALVE            +------------+-----------++ LVOT Vmax:  131.00 cm/s +------------+-----------++ LVOT Vmean: 85.400 cm/s +------------+-----------++ LVOT VTI:   0.264 m     +------------+-----------++   +-------------+-------++ AORTA                +-------------+-------++ Ao Root diam:2.80 cm +-------------+-------++  +--------------+----------++ MITRAL VALVE              +--------------+-------+ +--------------+----------++  SHUNTS                MV Area (PHT):10.25 cm   +--------------+-------+ +--------------+----------++  Systemic VTI: 0.26 m  MV PHT:       21.46 msec  +--------------+-------+ +--------------+----------++  Systemic Diam:1.90 cm MV Decel Time:74 msec     +--------------+-------+ +--------------+----------++ +--------------+-----------++ MV E velocity:91.80 cm/s  +--------------+-----------++ MV A velocity:130.00 cm/s +--------------+-----------++ MV E/A ratio: 0.71        +--------------+-----------++    Antimicrobials:  Anti-infectives (From admission, onward)   Start     Dose/Rate Route Frequency Ordered Stop   02/14/19 1400  cefTRIAXone (ROCEPHIN) 2 g in sodium chloride 0.9 % 100 mL IVPB     2 g 200 mL/hr over 30 Minutes Intravenous Every 24 hours 02/14/19 1334     02/14/19 0000  cefTRIAXone (ROCEPHIN) IVPB     2 g Intravenous Every 24 hours 02/14/19 1352 02/27/19 2359   02/11/19 2200  vancomycin (VANCOCIN) 1,250 mg in sodium chloride 0.9 % 250 mL IVPB  Status:  Discontinued     1,250 mg 166.7 mL/hr over 90 Minutes Intravenous Every 24 hours 02/11/19 0539 02/14/19 1334   02/11/19 1800  ceFEPIme (MAXIPIME) 2 g in sodium chloride 0.9 % 100 mL IVPB  Status:  Discontinued     2 g 200 mL/hr over 30 Minutes Intravenous Every 12 hours 02/11/19 0454 02/11/19 1212   02/11/19 1400  ceFEPIme (MAXIPIME) 2 g in sodium chloride 0.9 % 100 mL IVPB  Status:  Discontinued     2 g 200 mL/hr over 30 Minutes Intravenous Every 8 hours 02/11/19 1212 02/12/19 1151   02/11/19 0500  metroNIDAZOLE (FLAGYL) IVPB 500 mg  Status:  Discontinued     500 mg 100 mL/hr over 60 Minutes Intravenous Every 8 hours  02/11/19 0442 02/12/19 1151   02/11/19 0500  vancomycin (VANCOCIN) 1,250 mg in sodium chloride 0.9 % 250 mL IVPB     1,250 mg 166.7 mL/hr over 90 Minutes Intravenous  Once 02/11/19 0454 02/11/19 0759   02/11/19 0400  ceFEPIme (MAXIPIME) 2 g in sodium chloride 0.9 % 100 mL IVPB     2 g 200 mL/hr over 30 Minutes Intravenous  Once 02/11/19 0354 02/11/19 0443     Subjective: Seen and examined at bedside and was feeling good and still waiting for home health to be arranged.  All home health agencies currently have declined the patient services.  Still needs PT OT and RN and aide at discharge.  Also will need infusion teaching if home health cannot be set up and cannot safely discharge today.  She has follow-up appointments with medical oncology Dr. Simeon Craft such as well as infectious diseases Dr. Megan Salon on 02/25/2019.   Objective: Vitals:   02/15/19 1356 02/15/19 2019 02/16/19 0553 02/16/19 1358  BP: 122/73 137/80 133/81 127/77  Pulse: 91 99 90 91  Resp: 18 18 18 20   Temp: 98.2 F (36.8 C) 98.3 F (36.8 C) 99.1 F (37.3 C) 98.3 F (36.8 C)  TempSrc: Oral Oral Oral Oral  SpO2: 96% 97% 97% 99%  Weight:      Height:        Intake/Output Summary (Last 24 hours) at 02/16/2019 1721 Last data filed at 02/16/2019 1600 Gross per 24 hour  Intake 440 ml  Output 700 ml  Net -260 ml   Filed Weights   02/10/19 2112  Weight: 77.1 kg   Examination: Physical Exam:  Constitutional: WN/WD overweight Caucasian female currently in NAD and appears calm and comfortable Eyes: Lids and conjunctivae normal, sclerae anicteric  ENMT: External Ears, Nose appear normal. Grossly normal hearing. Mucous membranes are moist.  Neck: Appears normal, supple, no cervical masses, normal ROM, no appreciable thyromegaly; no JVD Respiratory: Diminished to auscultation bilaterally, no wheezing, rales, rhonchi or crackles. Normal respiratory effort and patient is not tachypenic. No accessory muscle use.  Cardiovascular: RRR, no murmurs / rubs / gallops. S1 and S2 auscultated. No extremity edema. Abdomen: Soft, non-tender, distended due to body habitus. No masses palpated. No appreciable hepatosplenomegaly. Bowel sounds positive x4.  GU: Deferred. Musculoskeletal: No clubbing / cyanosis of digits/nails. No joint deformity upper and lower extremities.  Skin: No rashes, lesions, ulcers on a limited skin evaluation. No induration; Warm and dry.  Neurologic: CN 2-12 grossly intact with no focal deficits. Romberg sign and cerebellar reflexes not assessed.  Psychiatric: Normal judgment and insight. Alert and oriented x 3. Normal mood and appropriate affect.   Data Reviewed: I have personally reviewed following labs and imaging studies  CBC: Recent Labs  Lab 02/11/19 1413 02/12/19 0532 02/13/19 0529 02/13/19 2042 02/14/19 0401  02/15/19 0433  WBC 8.0 9.1 7.3  --  5.5 4.5  NEUTROABS 6.7 6.7 4.6  --  3.2 1.9  HGB 7.4* 7.5* 7.3* 8.9* 8.5* 8.4*  HCT 22.9* 22.4* 22.0* 25.6* 25.2* 25.6*  MCV 102.7* 102.8* 101.9*  --  97.7 98.8  PLT 218 210 199  --  202 [id number]   Basic Metabolic Panel: Recent Labs  Lab 02/11/19 0923 02/11/19 1413 02/12/19 0532 02/13/19 0529 02/14/19 0401 02/15/19 0433  NA 137 134* 132* 136 138 140  K 3.6 3.5 3.5 3.5 3.4* 4.2  CL 111 103 103 105 106 107  CO2 21* 21* 20* 23 24 26   GLUCOSE 99 127* 124*  93 99 98  BUN 11 11 11 11 9 8   CREATININE 0.77 0.81 0.82 0.72 0.64 0.53  CALCIUM 7.6* 8.2* 7.6* 8.3* 8.4* 8.7*  MG 1.7  --  1.6* 2.0 1.7 1.8  PHOS 4.0  --  2.9 3.0 3.0 3.2   GFR: Estimated Creatinine Clearance: 62 mL/min (by C-G formula based on SCr of 0.53 mg/dL). Liver Function Tests: Recent Labs  Lab 02/11/19 0923 02/12/19 0532 02/13/19 0529 02/14/19 0401 02/15/19 0433  AST 21 22 19 25 26   ALT 19 19 16 19 20   ALKPHOS 87 79 72 87 86  BILITOT 0.6 0.8 0.5 0.4 0.7  PROT 5.6* 5.7* 5.6* 5.8* 5.9*  ALBUMIN 3.1* 3.0* 2.8* 2.9* 3.0*   No results for input(s): LIPASE, AMYLASE in the last 168 hours. No results for input(s): AMMONIA in the last 168 hours. Coagulation Profile: Recent Labs  Lab 02/10/19 2116  INR 1.1   Cardiac Enzymes: No results for input(s): CKTOTAL, CKMB, CKMBINDEX, TROPONINI in the last 168 hours. BNP (last 3 results) No results for input(s): PROBNP in the last 8760 hours. HbA1C: No results for input(s): HGBA1C in the last 72 hours. CBG: No results for input(s): GLUCAP in the last 168 hours. Lipid Profile: No results for input(s): CHOL, HDL, LDLCALC, TRIG, CHOLHDL, LDLDIRECT in the last 72 hours. Thyroid Function Tests: No results for input(s): TSH, T4TOTAL, FREET4, T3FREE, THYROIDAB in the last 72 hours. Anemia Panel: No results for input(s): VITAMINB12, FOLATE, FERRITIN, TIBC, IRON, RETICCTPCT in the last 72 hours. Sepsis Labs: Recent Labs  Lab 02/10/19  2116  LATICACIDVEN 1.2    Recent Results (from the past 240 hour(s))  Culture, blood (Routine x 2)     Status: Abnormal   Collection Time: 02/10/19  9:16 PM   Specimen: BLOOD  Result Value Ref Range Status   Specimen Description   Final    BLOOD RIGHT WRIST Performed at [facility] [address] [place], [place] [address]    Special Requests   Final    BOTTLES DRAWN AEROBIC AND ANAEROBIC Blood Culture adequate volume Performed at [facility] [address], [place], [place] [address]    Culture  Setup Time   Final    GRAM POSITIVE COCCI AEROBIC BOTTLE ONLY CRITICAL RESULT CALLED TO, READ BACK BY AND VERIFIED WITH: DR CAMPBELL [id number] AT 1157 AM BY CM Performed at [facility], [place] [address], [place], [place] [address]    Culture STAPHYLOCOCCUS CAPITIS (A)  Final   Report Status 02/14/2019 FINAL  Final   Organism ID, Bacteria STAPHYLOCOCCUS CAPITIS  Final      Susceptibility   Staphylococcus capitis - MIC*    CIPROFLOXACIN <=0.5 SENSITIVE Sensitive     ERYTHROMYCIN 0.5 SENSITIVE Sensitive     GENTAMICIN <=0.5 SENSITIVE Sensitive     OXACILLIN <=0.25 SENSITIVE Sensitive     TETRACYCLINE <=1 SENSITIVE Sensitive     VANCOMYCIN 1 SENSITIVE Sensitive     TRIMETH/SULFA <=10 SENSITIVE Sensitive     CLINDAMYCIN <=0.25 SENSITIVE Sensitive     RIFAMPIN <=0.5 SENSITIVE Sensitive     Inducible Clindamycin NEGATIVE Sensitive     * STAPHYLOCOCCUS CAPITIS  Culture, blood (Routine x 2)     Status: Abnormal   Collection Time: 02/10/19  9:21 PM   Specimen: BLOOD RIGHT HAND  Result Value Ref Range Status   Specimen Description   Final    BLOOD RIGHT HAND Performed at [facility], [place]  [address], [place], [place] [address]    Special Requests   Final    BOTTLES DRAWN AEROBIC AND ANAEROBIC Blood Culture adequate volume Performed at [facility] [address] [place], [place] [address]    Culture  Setup Time    Final    GRAM POSITIVE COCCI IN BOTH AEROBIC AND ANAEROBIC BOTTLES CRITICAL RESULT CALLED TO, READ BACK BY AND VERIFIED WITH: Sheffield Slider PharmD 17:00 02/11/19 ([email or web address])    Culture (A)  Final    STAPHYLOCOCCUS CAPITIS SUSCEPTIBILITIES PERFORMED ON PREVIOUS CULTURE WITHIN THE LAST 5 DAYS. Performed at [facility], [place] [address] [place], [place] [address]    Report Status 02/14/2019 FINAL  Final  Blood Culture ID Panel (Reflexed)     Status: Abnormal   Collection Time: 02/10/19  9:21 PM  Result Value Ref Range Status   Enterococcus species NOT DETECTED NOT DETECTED Final   Listeria monocytogenes NOT DETECTED NOT DETECTED Final   Staphylococcus species DETECTED (A) NOT DETECTED Final    Comment: Methicillin (oxacillin) susceptible coagulase negative staphylococcus. Possible blood culture contaminant (unless isolated from more than one blood culture draw or clinical case suggests pathogenicity). No antibiotic treatment is indicated for blood  culture contaminants. CRITICAL RESULT CALLED TO, READ BACK BY AND VERIFIED WITH: Sheffield Slider PharmD 17:00 02/11/19 ([email or web address])    Staphylococcus aureus (BCID) NOT DETECTED NOT DETECTED Final   Methicillin resistance NOT DETECTED NOT DETECTED Final   Streptococcus species NOT DETECTED NOT DETECTED Final   Streptococcus agalactiae NOT DETECTED NOT DETECTED Final   Streptococcus pneumoniae NOT DETECTED NOT DETECTED Final   Streptococcus pyogenes NOT DETECTED NOT DETECTED Final   Acinetobacter baumannii NOT DETECTED NOT DETECTED Final   Enterobacteriaceae species NOT DETECTED NOT DETECTED Final   Enterobacter cloacae complex NOT DETECTED NOT DETECTED Final   Escherichia coli NOT DETECTED NOT DETECTED Final   Klebsiella oxytoca NOT DETECTED NOT DETECTED Final   Klebsiella pneumoniae NOT DETECTED NOT DETECTED Final   Proteus species NOT DETECTED NOT DETECTED Final   Serratia marcescens NOT DETECTED NOT DETECTED Final   Haemophilus influenzae NOT  DETECTED NOT DETECTED Final   Neisseria meningitidis NOT DETECTED NOT DETECTED Final   Pseudomonas aeruginosa NOT DETECTED NOT DETECTED Final   Candida albicans NOT DETECTED NOT DETECTED Final   Candida glabrata NOT DETECTED NOT DETECTED Final   Candida krusei NOT DETECTED NOT DETECTED Final   Candida parapsilosis NOT DETECTED NOT DETECTED Final   Candida tropicalis NOT DETECTED NOT DETECTED Final    Comment: Performed at [facility], [address], [place], [place] [address]  SARS Coronavirus 2 [facility] order, Performed in [facility] hospital lab) Nasopharyngeal Nasopharyngeal Swab     Status: None   Collection Time: 02/10/19 11:23 PM   Specimen: Nasopharyngeal Swab  Result Value Ref Range Status   SARS Coronavirus 2 NEGATIVE NEGATIVE Final    Comment: (NOTE) If result is NEGATIVE SARS-CoV-2 target nucleic acids are NOT DETECTED. The SARS-CoV-2 RNA is generally detectable in upper and lower  respiratory specimens during the acute phase of infection. The lowest  concentration of SARS-CoV-2 viral copies this assay can detect is 250  copies / mL. A negative result does not preclude SARS-CoV-2 infection  and should not be used as the sole basis for treatment or other  patient management decisions.  A negative result may occur with  improper specimen collection / handling, submission of specimen other  than nasopharyngeal swab, presence of viral mutation(s) within the  areas targeted by this assay, and inadequate number of viral copies  (<250 copies / mL). A negative result must be combined with clinical  observations, patient history, and epidemiological information. If result is POSITIVE SARS-CoV-2 target nucleic acids are DETECTED. The SARS-CoV-2 RNA is generally detectable in upper and lower  respiratory specimens dur ing the acute phase of infection.  Positive  results are indicative of active infection with SARS-CoV-2.  Clinical  correlation with patient history and  other diagnostic information is  necessary to determine patient infection status.  Positive results do  not rule out bacterial infection or co-infection with other viruses. If result is PRESUMPTIVE POSTIVE SARS-CoV-2 nucleic acids MAY BE PRESENT.   A presumptive positive result was obtained on the submitted specimen  and confirmed on repeat testing.  While 2019 novel coronavirus  (SARS-CoV-2) nucleic acids may be present in the submitted sample  additional confirmatory testing may be necessary for epidemiological  and / or clinical management purposes  to differentiate between  SARS-CoV-2 and other Sarbecovirus currently known to infect humans.  If clinically indicated additional testing with an alternate test  methodology [phone]) is advised. The SARS-CoV-2 RNA is generally  detectable in upper and lower respiratory sp ecimens during the acute  phase of infection. The expected result is Negative. Fact Sheet for Patients:  StrictlyIdeas.no Fact Sheet for Healthcare Providers: BankingDealers.co.za This test is not yet approved or cleared by the [place] FDA and has been authorized for detection and/or diagnosis of SARS-CoV-2 by FDA under an Emergency Use Authorization (EUA).  This EUA will remain in effect (meaning this test can be used) for the duration of the COVID-19 declaration under Section 564(b)(1) of the Act, 21 U.S.C. section 360bbb-3(b)(1), unless the authorization is terminated or revoked sooner. Performed at [facility], [place] [address] [place], [place] [address]   Urine culture     Status: Abnormal   Collection Time: 02/11/19  2:00 AM   Specimen: Urine, Random  Result Value Ref Range Status   Specimen Description   Final    URINE, RANDOM Performed at [place] [address] [place], [place] [address]    Special Requests   Final    NONE Performed at [facility], [place] [address] [place], [place] [address]    Culture >=100,000 COLONIES/mL ENTEROCOCCUS FAECALIS (A)  Final   Report Status 02/12/2019 FINAL  Final   Organism ID, Bacteria ENTEROCOCCUS FAECALIS (A)  Final      Susceptibility   Enterococcus faecalis - MIC*    AMPICILLIN <=2 SENSITIVE Sensitive     LEVOFLOXACIN 2 SENSITIVE Sensitive     NITROFURANTOIN <=16 SENSITIVE Sensitive     VANCOMYCIN <=0.5 SENSITIVE Sensitive     * >=100,000 COLONIES/mL ENTEROCOCCUS FAECALIS  Culture, sputum-assessment     Status: None   Collection Time: 02/11/19  9:41 AM   Specimen: Sputum  Result Value Ref Range Status   Specimen Description SPUTUM  Final   Special Requests NONE  Final   Sputum evaluation   Final    Sputum specimen not acceptable for testing.  Please recollect.   NOTIFIED Pauline Good [id number] Performed at [facility], [place] [address] [place],  [address]    Report Status 02/11/2019 FINAL  Final  Culture, blood (routine x 2)     Status: None   Collection Time: 02/11/19  6:32 PM   Specimen: BLOOD  Result Value Ref Range Status   Specimen  Description   Final    BLOOD RIGHT ANTECUBITAL Performed at [place] [address], [place], [place] [address]    Special Requests   Final    BOTTLES DRAWN AEROBIC ONLY Blood Culture adequate volume Performed at [place] [address], [place], [place] [address]    Culture   Final    NO GROWTH 5 DAYS Performed at [facility], [place] [address], [place], [place] [address]    Report Status 02/16/2019 FINAL  Final  Culture, blood (routine x 2)     Status: None   Collection Time: 02/11/19  6:37 PM   Specimen: BLOOD  Result Value Ref Range Status   Specimen Description   Final    BLOOD LEFT ANTECUBITAL Performed at [place] [address] [place], [place] [address]    Special Requests   Final    BOTTLES DRAWN AEROBIC ONLY Blood  Culture adequate volume Performed at [place] [address], [place], [place] [address]    Culture   Final    NO GROWTH 5 DAYS Performed at [facility], [place] [address], [place], [place] [address]    Report Status 02/16/2019 FINAL  Final    Radiology Studies: No results found. Scheduled Meds: . Chlorhexidine Gluconate Cloth  6 each Topical Daily  . enoxaparin (LOVENOX) injection  40 mg Subcutaneous Q24H  . gabapentin  300 mg Oral BID  . sodium chloride flush  3 mL Intravenous Q12H   Continuous Infusions: . sodium chloride 75 mL/hr at 02/13/19 1600  . cefTRIAXone (ROCEPHIN)  IV 2 g (02/16/19 1304)    LOS: 5 days   Kerney Elbe, DO [facility] PAGER is on [place]  If 7PM-7AM, please contact night-coverage [email or web address] Password [facility] 02/16/2019, 5:21 PM

## 2019-02-16 NOTE — TOC Progression Note (Signed)
Transition of Care [facility]) - Progression Note    Patient Details  Name: Zoe Kim MRN: [id number] Date of Birth: [birth year]  Transition of [facility] CM/SW Contact  Joaquin Courts, RN Phone Number: 02/16/2019, 3:22 PM  Clinical Narrative:    CM made referrals to all home health agencies serving the [place] area [facility], [place], [place], [facility], [facility], [facility]. [facility], and [facility]. All agencies have declined to provide services with reasons ranging from staffing to patient being outside their service area.  This CM is unable to find [facility] services for the patient.  CM reached out to insurance company for assistance with finding an in network agency, however all agencies that are in network have already been contacted and declined.     Expected Discharge Plan: [place] Barriers to Discharge: Continued Medical Work up  Expected Discharge Plan and Services Expected Discharge Plan: [place]   Discharge Planning Services: CM Consult Post Acute Care Choice: [place] arrangements for the past 2 months: Single Family Home Expected Discharge Date: (unknown)               DME Arranged: N/A DME Agency: NA       HH Arranged: IV Antibiotics HH Agency: Other - See comment(advance home infusion) Date HH Agency Contacted: 02/14/19 Time [place]: [place] Representative spoke with at [place]: [place] Determinants of Health ([place]) Interventions    Readmission Risk Interventions No flowsheet data found.

## 2019-02-16 NOTE — Care Management Important Message (Signed)
Important Message  Patient Details IM Letter given to Louanna Raw RN to present to the Patient Name: Zoe Kim MRN: [id number] Date of Birth: Oct 27, 1945   Medicare Important Message Given:  Yes     [name] 02/16/2019, 10:25 AM

## 2019-02-16 NOTE — Progress Notes (Signed)
Physical Therapy Treatment Patient Details Name: Zoe Kim MRN: [id number] DOB: 1945/10/25 Today's Date: 02/16/2019    History of Present Illness 73yo female c/o fever, fatigue, and general weakness. Covid test negative. Admitted for SIRS. PMH anxiety, endometrial CA currently on radiation and chemo, TKR, peripheral neuropathy    PT Comments    Pt is motivated and progressing well; she is hopeful to d/c soon, reviewed LE exercises and options for theraband/LE strengthening;  Follow Up Recommendations  Home health PT;Other (comment)(vs no f/u)     Equipment Recommendations  Rolling walker with 5" wheels;3in1 (PT)    Recommendations for Other Services       Precautions / Restrictions Precautions Precautions: Fall Restrictions Weight Bearing Restrictions: No    Mobility  Bed Mobility         Supine to sit: Modified independent (Device/Increase time) Sit to supine: Modified independent (Device/Increase time)   General bed mobility comments: HOB elevated   Transfers Overall transfer level: Modified independent                  Ambulation/Gait Ambulation/Gait assistance: Supervision Gait Distance (Feet): 400 Feet Assistive device: Rolling walker (2 wheeled) Gait Pattern/deviations: Step-through pattern;Decreased stride length     General Gait Details: HR 121, returned to resting rate in 90s after seated rest, SPO2= 98% on RA pt in NAD   Stairs             Wheelchair Mobility    Modified Rankin (Stroke Patients Only)       Balance                                            Cognition Arousal/Alertness: Awake/alert Behavior During Therapy: WFL for tasks assessed/performed Overall Cognitive Status: Within Functional Limits for tasks assessed                                 General Comments: very pleasant and cooperative      Exercises      General Comments        Pertinent Vitals/Pain       Home Living                      Prior Function            PT Goals (current goals can now be found in the care plan section) Acute Rehab PT Goals 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 PT Goal Formulation: With patient Time For Goal Achievement: 02/28/19 Potential to Achieve Goals: Good Progress towards PT goals: Progressing toward goals    Frequency    Min 3X/week      PT Plan Current plan remains appropriate    Co-evaluation              AM-PAC PT "6 Clicks" Mobility   Outcome Measure  Help needed turning from your back to your side while in a flat bed without using bedrails?: None Help needed moving from lying on your back to sitting on the side of a flat bed without using bedrails?: None Help needed moving to and from a bed to a chair (including a wheelchair)?: None Help needed standing up from a chair using your arms (e.g., wheelchair or bedside chair)?: None Help needed to walk in hospital room?:  A Little Help needed climbing 3-5 steps with a railing? : A Little 6 Click Score: 22    End of Session   Activity Tolerance: Patient tolerated treatment well Patient left: in bed;with call bell/phone within reach;with nursing/sitter in room   PT Visit Diagnosis: Muscle weakness (generalized) (M62.81);Unsteadiness on feet (R26.81)     Time: 1152-1206 PT Time Calculation (min) (ACUTE ONLY): 14 min  Charges:  $Gait Training: 8-22 mins                     Kenyon Ana, PT  Pager: [phone] Acute Rehab Dept [facility]): [phone]   02/16/2019    [facility] 02/16/2019, 12:18 PM

## 2019-02-16 NOTE — Progress Notes (Signed)
Patient ID: Zoe Kim, female   DOB: 25-Oct-1945, 73 y.o.   MRN: [id number]         [facility]  Date of Admission:  02/10/2019    Total days of antibiotics 7        Day 3 ceftriaxone         ASSESSMENT: She has improved on therapy for methicillin sensitive coagulase-negative staph bacteremia probably related to her Port-A-Cath.  PLAN: 1. Continue ceftriaxone through 02/27/2019 2. She will follow-up with me on 02/25/2019 3. I will sign off now  Principal Problem:   Coagulase negative Staphylococcus bacteremia Active Problems:   Endometrial cancer ([place])   Peripheral neuropathy due to chemotherapy ([place])   Anemia due to antineoplastic chemotherapy   Urinary incontinence   Scheduled Meds: . Chlorhexidine Gluconate Cloth  6 each Topical Daily  . enoxaparin (LOVENOX) injection  40 mg Subcutaneous Q24H  . gabapentin  300 mg Oral BID  . sodium chloride flush  3 mL Intravenous Q12H   Continuous Infusions: . sodium chloride 75 mL/hr at 02/13/19 1600  . cefTRIAXone (ROCEPHIN)  IV 2 g (02/16/19 1304)   PRN Meds:.acetaminophen **OR** acetaminophen, guaiFENesin-dextromethorphan, Melatonin, morphine injection, ondansetron **OR** ondansetron (ZOFRAN) IV, polyethylene glycol, sodium chloride flush, traMADol   SUBJECTIVE: Zoe Kim is feeling much better.  She is feeling stronger and has been walking in the hall.  He is hopeful to be discharged later today.  Port-A-Cath was removed and a new PICC was placed 3 days ago.  Review of Systems: Review of Systems  Constitutional: Negative for chills, diaphoresis and fever.  Gastrointestinal: Negative for abdominal pain, diarrhea, nausea and vomiting.    Allergies  Allergen Reactions  . Sulfa Antibiotics     UNSPECIFIED REACTION     OBJECTIVE: Vitals:   02/15/19 1356 02/15/19 2019 02/16/19 0553 02/16/19 1358  BP: 122/73 137/80 133/81 127/77  Pulse: 91 99 90 91  Resp: 18 18 18 20   Temp: 98.2 F  (36.8 C) 98.3 F (36.8 C) 99.1 F (37.3 C) 98.3 F (36.8 C)  TempSrc: Oral Oral Oral Oral  SpO2: 96% 97% 97% 99%  Weight:      Height:       Body mass index is 29.18 kg/m.  Physical Exam Constitutional:      Comments: She is in good spirits.  Cardiovascular:     Rate and Rhythm: Normal rate and regular rhythm.     Heart sounds: No murmur.  Pulmonary:     Effort: Pulmonary effort is normal.     Breath sounds: Normal breath sounds.  Abdominal:     Palpations: Abdomen is soft.     Tenderness: There is no abdominal tenderness.  Psychiatric:        Mood and Affect: Mood normal.     Lab Results Lab Results  Component Value Date   WBC 4.5 02/15/2019   HGB 8.4 (L) 02/15/2019   HCT 25.6 (L) 02/15/2019   MCV 98.8 02/15/2019   PLT 228 02/15/2019    Lab Results  Component Value Date   CREATININE 0.53 02/15/2019   BUN 8 02/15/2019   NA 140 02/15/2019   K 4.2 02/15/2019   CL 107 02/15/2019   CO2 26 02/15/2019    Lab Results  Component Value Date   ALT 20 02/15/2019   AST 26 02/15/2019   ALKPHOS 86 02/15/2019   BILITOT 0.7 02/15/2019     Microbiology: Recent Results (from the past 240 hour(s))  Culture,  blood (Routine x 2)     Status: Abnormal   Collection Time: 02/10/19  9:16 PM   Specimen: BLOOD  Result Value Ref Range Status   Specimen Description   Final    BLOOD RIGHT WRIST Performed at [place] [address], [place], [place] [address]    Special Requests   Final    BOTTLES DRAWN AEROBIC AND ANAEROBIC Blood Culture adequate volume Performed at [facility] [address], [place], [place] [address]    Culture  Setup Time   Final    GRAM POSITIVE COCCI AEROBIC BOTTLE ONLY CRITICAL RESULT CALLED TO, READ BACK BY AND VERIFIED WITH: DR Irish Breisch [id number] AT 1157 AM BY CM Performed at [facility], [place] [address], [place], [place] [address]    Culture STAPHYLOCOCCUS CAPITIS (A)  Final   Report Status  02/14/2019 FINAL  Final   Organism ID, Bacteria STAPHYLOCOCCUS CAPITIS  Final      Susceptibility   Staphylococcus capitis - MIC*    CIPROFLOXACIN <=0.5 SENSITIVE Sensitive     ERYTHROMYCIN 0.5 SENSITIVE Sensitive     GENTAMICIN <=0.5 SENSITIVE Sensitive     OXACILLIN <=0.25 SENSITIVE Sensitive     TETRACYCLINE <=1 SENSITIVE Sensitive     VANCOMYCIN 1 SENSITIVE Sensitive     TRIMETH/SULFA <=10 SENSITIVE Sensitive     CLINDAMYCIN <=0.25 SENSITIVE Sensitive     RIFAMPIN <=0.5 SENSITIVE Sensitive     Inducible Clindamycin NEGATIVE Sensitive     * STAPHYLOCOCCUS CAPITIS  Culture, blood (Routine x 2)     Status: Abnormal   Collection Time: 02/10/19  9:21 PM   Specimen: BLOOD RIGHT HAND  Result Value Ref Range Status   Specimen Description   Final    BLOOD RIGHT HAND Performed at [facility], [place] [address], [place], [place] [address]    Special Requests   Final    BOTTLES DRAWN AEROBIC AND ANAEROBIC Blood Culture adequate volume Performed at [place] [address], [place], [place] [address]    Culture  Setup Time   Final    GRAM POSITIVE COCCI IN BOTH AEROBIC AND ANAEROBIC BOTTLES CRITICAL RESULT CALLED TO, READ BACK BY AND VERIFIED WITH: [place] Slider PharmD 17:00 02/11/19 ([email or web address])    Culture (A)  Final    STAPHYLOCOCCUS CAPITIS SUSCEPTIBILITIES PERFORMED ON PREVIOUS CULTURE WITHIN THE LAST 5 DAYS. Performed at [facility], [place] [address], [place], [place] [address]    Report Status 02/14/2019 FINAL  Final  Blood Culture ID Panel (Reflexed)     Status: Abnormal   Collection Time: 02/10/19  9:21 PM  Result Value Ref Range Status   Enterococcus species NOT DETECTED NOT DETECTED Final   Listeria monocytogenes NOT DETECTED NOT DETECTED Final   Staphylococcus species DETECTED (A) NOT DETECTED Final    Comment: Methicillin (oxacillin) susceptible coagulase negative staphylococcus. Possible blood culture contaminant (unless isolated from more  than one blood culture draw or clinical case suggests pathogenicity). No antibiotic treatment is indicated for blood  culture contaminants. CRITICAL RESULT CALLED TO, READ BACK BY AND VERIFIED WITH: Sheffield Slider PharmD 17:00 02/11/19 ([email or web address])    Staphylococcus aureus (BCID) NOT DETECTED NOT DETECTED Final   Methicillin resistance NOT DETECTED NOT DETECTED Final   Streptococcus species NOT DETECTED NOT DETECTED Final   Streptococcus agalactiae NOT DETECTED NOT DETECTED Final   Streptococcus pneumoniae NOT DETECTED NOT DETECTED Final   Streptococcus pyogenes NOT DETECTED NOT DETECTED Final  Acinetobacter baumannii NOT DETECTED NOT DETECTED Final   Enterobacteriaceae species NOT DETECTED NOT DETECTED Final   Enterobacter cloacae complex NOT DETECTED NOT DETECTED Final   Escherichia coli NOT DETECTED NOT DETECTED Final   Klebsiella oxytoca NOT DETECTED NOT DETECTED Final   Klebsiella pneumoniae NOT DETECTED NOT DETECTED Final   Proteus species NOT DETECTED NOT DETECTED Final   Serratia marcescens NOT DETECTED NOT DETECTED Final   Haemophilus influenzae NOT DETECTED NOT DETECTED Final   Neisseria meningitidis NOT DETECTED NOT DETECTED Final   Pseudomonas aeruginosa NOT DETECTED NOT DETECTED Final   Candida albicans NOT DETECTED NOT DETECTED Final   Candida glabrata NOT DETECTED NOT DETECTED Final   Candida krusei NOT DETECTED NOT DETECTED Final   Candida parapsilosis NOT DETECTED NOT DETECTED Final   Candida tropicalis NOT DETECTED NOT DETECTED Final    Comment: Performed at [facility], [place] [address], [place], [place] [address]  SARS Coronavirus 2 [facility] order, Performed in [facility] hospital lab) Nasopharyngeal Nasopharyngeal Swab     Status: None   Collection Time: 02/10/19 11:23 PM   Specimen: Nasopharyngeal Swab  Result Value Ref Range Status   SARS Coronavirus 2 NEGATIVE NEGATIVE Final    Comment: (NOTE) If result is NEGATIVE SARS-CoV-2 target nucleic acids are NOT  DETECTED. The SARS-CoV-2 RNA is generally detectable in upper and lower  respiratory specimens during the acute phase of infection. The lowest  concentration of SARS-CoV-2 viral copies this assay can detect is 250  copies / mL. A negative result does not preclude SARS-CoV-2 infection  and should not be used as the sole basis for treatment or other  patient management decisions.  A negative result may occur with  improper specimen collection / handling, submission of specimen other  than nasopharyngeal swab, presence of viral mutation(s) within the  areas targeted by this assay, and inadequate number of viral copies  (<250 copies / mL). A negative result must be combined with clinical  observations, patient history, and epidemiological information. If result is POSITIVE SARS-CoV-2 target nucleic acids are DETECTED. The SARS-CoV-2 RNA is generally detectable in upper and lower  respiratory specimens dur ing the acute phase of infection.  Positive  results are indicative of active infection with SARS-CoV-2.  Clinical  correlation with patient history and other diagnostic information is  necessary to determine patient infection status.  Positive results do  not rule out bacterial infection or co-infection with other viruses. If result is PRESUMPTIVE POSTIVE SARS-CoV-2 nucleic acids MAY BE PRESENT.   A presumptive positive result was obtained on the submitted specimen  and confirmed on repeat testing.  While 2019 novel coronavirus  (SARS-CoV-2) nucleic acids may be present in the submitted sample  additional confirmatory testing may be necessary for epidemiological  and / or clinical management purposes  to differentiate between  SARS-CoV-2 and other Sarbecovirus currently known to infect humans.  If clinically indicated additional testing with an alternate test  methodology [phone]) is advised. The SARS-CoV-2 RNA is generally  detectable in upper and lower respiratory sp ecimens during  the acute  phase of infection. The expected result is Negative. Fact Sheet for Patients:  StrictlyIdeas.no Fact Sheet for Healthcare Providers: BankingDealers.co.za This test is not yet approved or cleared by the [place] FDA and has been authorized for detection and/or diagnosis of SARS-CoV-2 by FDA under an Emergency Use Authorization (EUA).  This EUA will remain in effect (meaning this test can be used) for the duration of the COVID-19  declaration under Section 564(b)(1) of the Act, 21 U.S.C. section 360bbb-3(b)(1), unless the authorization is terminated or revoked sooner. Performed at [facility], [place] [address] [place], [place] [address]   Urine culture     Status: Abnormal   Collection Time: 02/11/19  2:00 AM   Specimen: Urine, Random  Result Value Ref Range Status   Specimen Description   Final    URINE, RANDOM Performed at [place] [address], [place], [place] [address]    Special Requests   Final    NONE Performed at [facility], [place] [address] [place], [place] [address]    Culture >=100,000 COLONIES/mL ENTEROCOCCUS FAECALIS (A)  Final   Report Status 02/12/2019 FINAL  Final   Organism ID, Bacteria ENTEROCOCCUS FAECALIS (A)  Final      Susceptibility   Enterococcus faecalis - MIC*    AMPICILLIN <=2 SENSITIVE Sensitive     LEVOFLOXACIN 2 SENSITIVE Sensitive     NITROFURANTOIN <=16 SENSITIVE Sensitive     VANCOMYCIN <=0.5 SENSITIVE Sensitive     * >=100,000 COLONIES/mL ENTEROCOCCUS FAECALIS  Culture, sputum-assessment     Status: None   Collection Time: 02/11/19  9:41 AM   Specimen: Sputum  Result Value Ref Range Status   Specimen Description SPUTUM  Final   Special Requests NONE  Final   Sputum evaluation   Final    Sputum specimen not acceptable for testing.  Please recollect.   NOTIFIED Pauline Good [id number] Performed at [facility], [place] [address] [place], [place] [address]    Report Status 02/11/2019 FINAL  Final  Culture, blood (routine x 2)     Status: None   Collection Time: 02/11/19  6:32 PM   Specimen: BLOOD  Result Value Ref Range Status   Specimen Description   Final    BLOOD RIGHT ANTECUBITAL Performed at [place] [address] [place], [place] [address]    Special Requests   Final    BOTTLES DRAWN AEROBIC ONLY Blood Culture adequate volume Performed at [place] [address], [place], [place] [address]    Culture   Final    NO GROWTH 5 DAYS Performed at [facility], [place] [address] [place], [place] [address]    Report Status 02/16/2019 FINAL  Final  Culture, blood (routine x 2)     Status: None   Collection Time: 02/11/19  6:37 PM   Specimen: BLOOD  Result Value Ref Range Status   Specimen Description   Final    BLOOD LEFT ANTECUBITAL Performed at [place] [address], [place], [place] [address]    Special Requests   Final    BOTTLES DRAWN AEROBIC ONLY Blood Culture adequate volume Performed at [place] [address], [place], [place] [address]    Culture   Final    NO GROWTH 5 DAYS Performed at [facility], [place] [address] [place],  [address]    Report Status 02/16/2019 FINAL  Final    Michel Bickers, MD [facility] [phone] pager   [phone] cell 02/16/2019, 3:29 PM

## 2019-02-16 NOTE — Telephone Encounter (Signed)
Called Zoe Kim with follow up appointment with Dr. Alvy Bimler on 02/25/19 at 8:30.  She verbalized understanding and agreement.

## 2019-02-17 ENCOUNTER — Telehealth: Payer: Self-pay | Admitting: *Deleted

## 2019-02-17 MED ORDER — HEPARIN SOD (PORK) LOCK FLUSH 100 UNIT/ML IV SOLN
250.0000 [IU] | INTRAVENOUS | Status: DC | PRN
  Filled 2019-02-17: qty 2.5

## 2019-02-17 MED ORDER — LINEZOLID 600 MG PO TABS: 600.0000 mg | ORAL_TABLET | Freq: Two times a day (BID) | ORAL | 0 refills | Status: DC

## 2019-02-17 MED ORDER — SODIUM CHLORIDE 0.9% FLUSH: 10.0000 mL | INTRAVENOUS | 0 refills | Status: DC

## 2019-02-17 MED ORDER — HEPARIN SOD (PORK) LOCK FLUSH 100 UNIT/ML IV SOLN: 250.0000 [IU] | INTRAVENOUS | 0 refills | Status: DC

## 2019-02-17 MED ORDER — HEPARIN SOD (PORK) LOCK FLUSH 100 UNIT/ML IV SOLN: 250.0000 [IU] | INTRAVENOUS | Status: DC

## 2019-02-17 MED ORDER — SODIUM CHLORIDE 0.9% FLUSH: 10.0000 mL | INTRAVENOUS | 0 refills | Status: AC

## 2019-02-17 MED ORDER — LINEZOLID 600 MG PO TABS
600.0000 mg | ORAL_TABLET | Freq: Two times a day (BID) | ORAL | Status: DC
  Administered 2019-02-17: 600 mg via ORAL
  Filled 2019-02-17 (×2): qty 1

## 2019-02-17 MED FILL — HEPARIN SOD 100U/ML PFS: 100 | 31 days supply | Qty: 65 | Fill #0

## 2019-02-17 MED FILL — LINEZOLID 600 MG TAB: 600 | 10 days supply | Qty: 20 | Fill #0

## 2019-02-17 MED FILL — NORMAL SALINE FLUSH SYRINGE: 0.9 | 31 days supply | Qty: 130 | Fill #0

## 2019-02-17 NOTE — Discharge Summary (Signed)
Physician Discharge Summary  TATIANAH SUSTAITA [id number] DOB: 1946/02/17 DOA: 02/10/2019  PCP: Lujean Amel, MD  Admit date: 02/10/2019 Discharge date: 02/17/2019  Admitted From: Home  Disposition: Home with [place]  Recommendations for Outpatient Follow-up:  1. Follow up with PCP in 1-2 weeks 2. Follow up with Medical Oncology Dr. Alvy Bimler withon 1-2 weeks 3. Follow up with ID Dr. Megan Salon on 02/25/2019; C/w po Abx until 02/27/2019 4. Please obtain CMP/CBC, Mag, Phos in one week 5. Please follow up on the following pending results:  Home Health: Yes Equipment/Devices: PICC, RW with 5" Wheels, 3in1  Discharge Condition: Stable CODE STATUS: FULL CODE Diet recommendation: Heart Healthy Diet  Brief/Interim Summary: Patient is a 73 year old female with a past medical history significant for but not limited to endometrial cancer currently undergoing treatment with radiation and chemotherapy which is complicated by peripheral neuropathy and anemia who presented to the ED with a day of fevers, chills, fatigue and generalized weakness. She reports that she woke up on the morning of 02/10/2019 with fevers chills and malaise and intermittently over the last month or so she has been having similar symptoms but had been worsening recently. Denied any sick contacts. In the ED she was found to be febrile and saturating on room air with tachycardia and a stable blood pressure. COVIDTesting was negative. Urinalysis showed some rare bacteria, moderate leukocytes, 11-20 WBCs per high-power field.   In the ED she was given normal saline 1.5 L boluses and started on broad-spectrum antibiotics with IV vancomycin IV cefepime.  Further work-up reveals a MSSA Gram Postive Cocci Bacteremia of Staph Capitis and urine is growing out greater than 100,000 colony-forming units of Enterococcus faecalis.  Infectious disease was consulted for further evaluation recommendations and an echocardiogram has  been ordered for her bacteremia.  Infectious Diseases was consulted to adjust her antibiotic regimen currently and further work-up has been pursued and blood cultures were repeated which are showing NGTD at 3 Days.  Likely source of her infection is her Port-A-Cath and patient has elected for Port-A-Cath to be removed even though ID states that she can keep it.  I spoke with Dr. Prince Rome today and he recommends discontinuing vancomycin given the pan sensitivity for her Staph Capitis infection and changing to IV ceftriaxone.  Interventional radiology was consulted for Port-A-Cath removal and since this is the patient's second bacteremia since July.  She is typed and screened and transfused 1 unit of PRBCs given her anemia.  She is improving daily and can likely be discharged home once her OPAT are in place along with equipment and this may be Monday now.  Currently medically stable and awaiting for home health to be set up and case management have been extremely hard time setting this up currently.  All home health agencies have declined to provide services with reasons ranging from staffing to patient being outside of the surface area.  Cannot safely send today until home health has been set up given that she needs teaching for infusion pump because she had issues with her insurance company Dr. Megan Salon changed her medications from IV ceftriaxone to p.o. linezolid and will continue that discharge until 925.  She did stable for discharge and she will be discharged with a PICC line for her chemotherapy need to follow-up with Dr. Alvy Bimler within 1 week.  She will follow-up with Dr. Megan Salon on 02/22/2019 and continue antibiotics till 925 and healed make a decision whether to continue these further.  She is deemed  stable for discharge and improved since she came in.  Discharge Diagnoses:  Principal Problem:   Coagulase negative Staphylococcus bacteremia Active Problems:   Endometrial cancer ([place])   Peripheral  neuropathy due to chemotherapy ([place])   Anemia due to antineoplastic chemotherapy   Urinary incontinence  Sepsis 2/2 Staph Capitus Bacteremia in the setting of infected Port-A-Cath, ruled in and poA -Presents with one day of fever and fatigue, and is found to be febrile and tachycardic in ED reasurringly normal lactate, clear CXR,negative COVID-19 test, nourinary sxs, noabd pain or N/V/D,nomeningismus, noapparent cellulitis,no erythema or tenderness about theport in right chest that is functioning appropriately -Blood and urine cultures collected in ED and grew out MSSA Staph Species Staph Capitus which is Pan-Sensitive and Urine is growing out >100,000 CFU of Enterococcus which was pan-sensitive but likely a colonizatgion; repeat blood cultures on 02/11/2019 showed no growth to date at 3 days -She was given 1.5 liters of NS as boluses, and broad spectrum antibiotics initiatedwith IV Vancomycin, Cefepime and Flagyl; Will defer Abx Management to ID as I have consulted Dr. Megan Salon and he recommends continue IV vancomycin for now until further blood cultures are out and he has discontinued IV Cefepime and Flagyl; spoke with Dr. Prince Rome of infectious disease today who recommends stopping IV vancomycin and starting IV Ceftriaxone 2 g daily for the next 12 days for completion course on 02/27/2019 -OPAT Orders to be placed and caseworker is in the process of arranging home health -IV fluids have now stopped -Echocardiogram was ordered because of her suspected bacteremia and showed no vegetations or evidence of endocarditis -Repeated blood cultures 02/11/2019 showed no growth to date at 2 days -Continue to monitor temperature curve and cultures; currently she has now defervesced and T-max in last 24 hours of 99 -Dr. Alvy Bimler recommending Port-A-Cath extraction and has consulted interventional radiology for Port-A-Cath removal. -We will obtain PT and OT to evaluate and recommending [place] consulted for Assistance with OPAT and arranging Home Health and ensuring that patient can receive her IV Ceftriaxone at discharge; currently caseworkers having extremely hard time arranging home health services that started the [place], [place] area;  could not safely discharge home yesterday however today ID has changed her antibiotics to p.o. antibiotics and can discharge her today   Enterococcus urinary tract colonization, present on admission -As above -Urinalysis grew out straw-colored urine, moderate leukocytes, rare bacteria, 11-20 WBCs, urine culture grew out greater than 100,000 colony-forming units of enterococcus faecalis which is pansensitive -ID was consulted and will defer antibiotic management to them as we have initiated broad-spectrum antibiotics on admission and this was continued.  And she will be continued on IV vancomycin pending further cultures  -Dr. Megan Salon feels that her urine may be colonized with enterococcus as she is showing no symptoms she was however having some urinary frequency and incontinence earlier prior to admission but I would agree with Dr. Hale Bogus assessment along with Dr. Prince Rome assessment that this is likely a colonization  Endometrial cancer -Undergoing treatment with chemotherapy and radiationbut her treatment had been placed on hold for over 2 weeks since her recent hospital discharge. -Dr. Alvy Bimler has canceled her next treatment next week pending further treatment this hospitalization -Continue gabapentin for associated neuropathy -We will consult her primary oncologist Dr. Alvy Bimler for further evaluation recommendations -She has follow-up with Dr. Alvy Bimler as an outpatient   Normocytic/Macrocytic Anemia -Hgb is 8.9 on admission, stable and secondary to cancer treatment -Hemoglobin/hematocrit now dropped to 7.3/22.0  so Medical oncology is typing and screening transfusing 1 unit PRBCs for this patient -Repeat Hgb/Hct  was 8.9/25.6 and is now 8.4/25.6 the day before yesterday - Continue to monitor for signs and symptoms of bleeding; currently no overt bleeding noted - Checked anemia panel this AM and showed an iron level of 24, U IBC 171, TIBC 195, saturation ratios of 12%, ferritin level of 261, folate level 21.3, vitamin B12 1447 -Repeat CBC as an outpatient  MetabolicAcidosis -Patient's CO2 is now 26 and anion gap is now 7, yesterday -IV fluid hydration is now stopped -Repeat CMP as an outpatient  Hyponatremia, improved -Patient sodium on admission is 133 now improved back to 140  the day before yesterday -We will discontinue IV fluid hydration -Continue toMonitor trend and repeat CMP as an outpatient  Hypomagnesemia, improved -Patient's Mag Level this AM was 1.8  the day before yesterday -Continue to monitor and replete as necessary -Repeat magnesium level as an outpatient  Hypokalemia -Patient's K+ was 3.4 and improved to 4.2  the day before yesterday -Continue to Monitor and Replete as Necessary -Repeat CMP as an outpatient  Hyperglycemia -Likely reactive in the setting of infection however will need to rule out diabetes mellitus and obtain a hemoglobin A1c in the a.m. -Blood sugars on daily BMPs/CMP's have been ranging from 93-127 -Continue to monitor blood sugars carefully and if necessary will need to place on a sensitive NovoLog/scale insulin [facility]  Discharge Instructions  Discharge Instructions    Home infusion instructions [facility] May follow [place] Dosing Protocol; May administer Cathflo as needed to maintain patency of vascular access device.; Flushing of vascular access device: per [facility] Protocol: 0.9% NaCl pre/post medica...   Complete by: As directed    Instructions: May follow [place] Dosing Protocol   Instructions: May administer Cathflo as needed to maintain patency of vascular access device.   Instructions: Flushing of vascular access device: per  [facility] Protocol: 0.9% NaCl pre/post medication administration and prn patency; Heparin 100 u/ml, 4ml for implanted ports and Heparin 10u/ml, 14ml for all other central venous catheters.   Instructions: May follow [facility] Anaphylaxis Protocol for First Dose Administration in the home: 0.9% NaCl at 25-50 ml/hr to maintain IV access for protocol meds. Epinephrine 0.3 ml IV/IM PRN and Benadryl 25-50 IV/IM PRN s/s of anaphylaxis.   Instructions: [place] Infusion Coordinator (RN) to assist per patient IV care needs in the home PRN.     Allergies as of 02/17/2019      Reactions   Sulfa Antibiotics    UNSPECIFIED REACTION       Medication List    STOP taking these medications   ibuprofen 600 MG tablet Commonly known as: ADVIL     TAKE these medications   acetaminophen 500 MG tablet Commonly known as: TYLENOL Take 1,000 mg by mouth every 6 (six) hours as needed for moderate pain.   gabapentin 300 MG capsule Commonly known as: NEURONTIN Take 1 capsule (300 mg total) by mouth 2 (two) times daily.   guaiFENesin-dextromethorphan 100-10 MG/5ML syrup Commonly known as: ROBITUSSIN DM Take 5 mLs by mouth every 4 (four) hours as needed for cough (chest congestion).   heparin lock flush 100 UNIT/ML Soln injection Inject 2.5 mLs (250 Units total) into the vein every Monday, Wednesday, and Friday. Start taking on: February 18, 2019   linezolid 600 MG tablet Commonly known as: ZYVOX Take 1 tablet (600 mg total) by mouth every 12 (twelve) hours for 10  days.   MELATONIN PO Take 1 tablet by mouth at bedtime as needed (sleep).   ondansetron 4 MG tablet Commonly known as: ZOFRAN Take 1 tablet (4 mg total) by mouth every 6 (six) hours as needed for nausea.   polyethylene glycol 17 g packet Commonly known as: MIRALAX / GLYCOLAX Take 17 g by mouth daily as needed for mild constipation.   sodium chloride flush 0.9 % Soln Commonly known as: NS 10 mLs by Intracatheter route every Monday,  Wednesday, and Friday for 10 days. Start taking on: February 18, 2019   traMADol 50 MG tablet Commonly known as: ULTRAM Take 1 tablet (50 mg total) by mouth every 6 (six) hours as needed. What changed: reasons to take this            Home Infusion Instuctions  (From admission, onward)         Start     Ordered   02/14/19 0000  Home infusion instructions [facility] May follow [place] Dosing Protocol; May administer Cathflo as needed to maintain patency of vascular access device.; Flushing of vascular access device: per [facility] Protocol: 0.9% NaCl pre/post medica...    Question Answer Comment  Instructions May follow [place] Dosing Protocol   Instructions May administer Cathflo as needed to maintain patency of vascular access device.   Instructions Flushing of vascular access device: per [facility] Protocol: 0.9% NaCl pre/post medication administration and prn patency; Heparin 100 u/ml, 18ml for implanted ports and Heparin 10u/ml, 75ml for all other central venous catheters.   Instructions May follow [facility] Anaphylaxis Protocol for First Dose Administration in the home: 0.9% NaCl at 25-50 ml/hr to maintain IV access for protocol meds. Epinephrine 0.3 ml IV/IM PRN and Benadryl 25-50 IV/IM PRN s/s of anaphylaxis.   Instructions [facility] Infusion Coordinator (RN) to assist per patient IV care needs in the home PRN.      02/14/19 1352           Durable Medical Equipment  (From admission, onward)         Start     Ordered   02/14/19 1404  For home use only DME 3 n 1  Once     02/14/19 1403   02/14/19 1403  For home use only DME Walker rolling  Once    Question:  Patient needs a walker to treat with the following condition  Answer:  Generalized weakness   02/14/19 1403         Follow-up Information    [name], MD. Call.   Specialties: Family Medicine, Sports Medicine Why: Follow up within 1 week  Contact information: [address] [place]  [id number] [phone]        Michel Bickers, MD. Call.   Specialty: Infectious Diseases Why: Follow up in 2 weeks Contact information: [address] [place] [address] [phone]        Heath Lark, MD. Call.   Specialty: Hematology and Oncology Why: Follow up within 1 week Contact information: [place] [address] [phone]          Allergies  Allergen Reactions  . Sulfa Antibiotics     UNSPECIFIED REACTION    Consultations:  Infectious Diseases  Medical Oncology  Procedures/Studies: Dg Chest 2 View  Result Date: 02/10/2019 CLINICAL DATA:  Suspected sepsis EXAM: CHEST - 2 VIEW COMPARISON:  December 30, 2018 FINDINGS: The heart size and mediastinal contours are within normal limits.  There is a right-sided MediPort catheter with the tip at the superior cavoatrial junction. Both lungs are clear. No acute osseous abnormality. IMPRESSION: No acute cardiopulmonary disease. Electronically Signed   By: Prudencio Pair M.D.   On: 02/10/2019 21:38   Ir Removal [employer] W/ [place] W/o Fl Mod Sed  Result Date: 02/13/2019 INDICATION: 73 year old female with a right IJ approach port catheter which was placed by interventional radiology (Dr. Kathlene Cote) on 11/06/2018. Unfortunately, the patient is currently immunocompromised on chemotherapy with bacteremia and concern for seeding of the port catheter. She presents for port removal and placement of a PICC so that she may continue her chemotherapy. EXAM: REMOVAL RIGHT IJ VEIN PORT-A-CATH MEDICATIONS: None ANESTHESIA/SEDATION: None FLUOROSCOPY TIME:  None COMPLICATIONS: None immediate. PROCEDURE: Informed written consent was obtained from the patient after a thorough discussion of the procedural risks, benefits and alternatives. All questions were addressed. Maximal Sterile Barrier Technique was utilized including caps, mask, sterile gowns, sterile gloves, sterile drape, hand hygiene and skin  antiseptic. A timeout was performed prior to the initiation of the procedure. The right chest was prepped and draped in a sterile fashion. Lidocaine was utilized for local anesthesia. An incision was made over the previously healed surgical incision. Utilizing blunt dissection, the port catheter and reservoir were removed from the underlying subcutaneous tissue in their entirety. Securing sutures were also removed. The pocket was irrigated with a copious amount of sterile normal saline. The pocket was closed with interrupted 3-0 Vicryl stitches. The subcutaneous tissue was closed with 3-0 Vicryl interrupted subcutaneous stitches. A 4-0 Vicryl running subcuticular stitch was utilized to approximate the skin. Dermabond was applied. IMPRESSION: Successful right IJ vein Port-A-Cath explant. No evidence of infection within the reservoir pocket. Electronically Signed   By: Jacqulynn Cadet M.D.   On: 02/13/2019 17:08   Ir Picc Placement Right >5 Yrs Inc Img Guide  Result Date: 02/13/2019 INDICATION: 73 year old female with suspected bacterial seeding of her port catheter and a continued need for chemotherapy. She presents for port catheter removal and simultaneous PICC placement. EXAM: PICC LINE PLACEMENT WITH ULTRASOUND AND FLUOROSCOPIC GUIDANCE MEDICATIONS: None. ANESTHESIA/SEDATION: None. FLUOROSCOPY TIME:  Fluoroscopy Time: 0 minutes 12 seconds (3 mGy). COMPLICATIONS: None immediate. PROCEDURE: The patient was advised of the possible risks and complications and agreed to undergo the procedure. The patient was then brought to the angiographic suite for the procedure. The right arm was prepped with chlorhexidine, draped in the usual sterile fashion using maximum barrier technique (cap and mask, sterile gown, sterile gloves, large sterile sheet, hand hygiene and cutaneous antisepsis) and infiltrated locally with 1% Lidocaine. Ultrasound demonstrated patency of the right brachial vein, and this was documented with an  image. Under real-time ultrasound guidance, this vein was accessed with a 21 gauge micropuncture needle and image documentation was performed. A 0.018 wire was introduced in to the vein. Over this, a 5 [place] single lumen power-injectable PICC was advanced to the lower SVC/right atrial junction. Fluoroscopy during the procedure and fluoro spot radiograph confirms appropriate catheter position. The catheter was flushed and covered with a sterile dressing. Catheter length: 36 cm IMPRESSION: Successful right arm Power PICC line placement with ultrasound and fluoroscopic guidance. The catheter is ready for use. Electronically Signed   By: Jacqulynn Cadet M.D.   On: 02/13/2019 17:09    ECHOCARDIOGRAM  IMPRESSIONS   1. The left ventricle has hyperdynamic systolic function, with an ejection fraction of >65%. The cavity size was normal. There is mild concentric left ventricular  hypertrophy. Left ventricular diastolic Doppler parameters are consistent with impaired  relaxation. 2. The right ventricle has normal systolic function. The cavity was normal. There is no increase in right ventricular wall thickness. Right ventricular systolic pressure is normal. 3. The aorta is normal unless otherwise noted.  SUMMARY  No vegatation was seen. FINDINGS Left Ventricle: The left ventricle has hyperdynamic systolic function, with an ejection fraction of >65%. The cavity size was normal. There is mild concentric left ventricular hypertrophy. Left ventricular diastolic Doppler parameters are consistent  with impaired relaxation. Normal left ventricular filling pressures  Right Ventricle: The right ventricle has normal systolic function. The cavity was normal. There is no increase in right ventricular wall thickness. Right ventricular systolic pressure is normal.  Left Atrium: Left atrial size was normal in size.  Right Atrium: Right atrial size was normal in size.  Interatrial Septum: No atrial  level shunt detected by color flow Doppler.  Pericardium: There is no evidence of pericardial effusion.  Mitral Valve: The mitral valve is normal in structure. Mitral valve regurgitation is not visualized by color flow Doppler.  Tricuspid Valve: The tricuspid valve is normal in structure. Tricuspid valve regurgitation was not visualized by color flow Doppler.  Aortic Valve: The aortic valve is normal in structure. Aortic valve regurgitation was not visualized by color flow Doppler.  Pulmonic Valve: The pulmonic valve was normal in structure. Pulmonic valve regurgitation was not assessed by color flow Doppler.  Aorta: The aorta is normal unless otherwise noted.  Venous: The inferior vena cava is normal in size with greater than 50% respiratory variability.   +--------------+--------++ LEFT VENTRICLE  +----------------+---------++ +--------------+--------++ Diastology   PLAX 2D   +----------------+---------++ +--------------+--------++ LV e' lateral: 8.38 cm/s LVIDd: 4.60 cm  +----------------+---------++ +--------------+--------++ LV E/e' lateral:11.0  LVIDs: 2.40 cm  +----------------+---------++ +--------------+--------++ LV e' medial: 9.57 cm/s LV PW: 1.20 cm  +----------------+---------++ +--------------+--------++ LV E/e' medial: 9.6  LV IVS: 1.10 cm  +----------------+---------++ +--------------+--------++ LVOT diam: 1.90 cm  +--------------+--------++ LV SV: 77 ml  +--------------+--------++ LV SV Index: 40.84  +--------------+--------++ LVOT Area: 2.84 cm +--------------+--------++    +--------------+--------++  +---------------+----------++ RIGHT VENTRICLE  +---------------+----------++ RV Basal diam: 2.80 cm  +---------------+----------++ RV S prime: 19.00  cm/s +---------------+----------++ TAPSE (M-mode):2.9 cm  +---------------+----------++  +---------------+-------++-----------++ LEFT ATRIUM  Index  +---------------+-------++-----------++ LA diam: 3.60 cm1.97 cm/m  +---------------+-------++-----------++ LA Vol (A2C): 33.0 ml18.07 ml/m +---------------+-------++-----------++ LA Vol (A4C): 43.5 ml23.83 ml/m +---------------+-------++-----------++ LA Biplane Vol:38.4 ml21.03 ml/m +---------------+-------++-----------++ +------------+-----------++ AORTIC VALVE  +------------+-----------++ LVOT Vmax: 131.00 cm/s +------------+-----------++ LVOT Vmean: 85.400 cm/s +------------+-----------++ LVOT VTI: 0.264 m  +------------+-----------++  +-------------+-------++ AORTA   +-------------+-------++ Ao Root diam:2.80 cm +-------------+-------++  +--------------+----------++ MITRAL VALVE   +--------------+-------+ +--------------+----------++ SHUNTS   MV Area (PHT):10.25 cm  +--------------+-------+ +--------------+----------++ Systemic VTI: 0.26 m  MV PHT: 21.46 msec +--------------+-------+ +--------------+----------++ Systemic Diam:1.90 cm MV Decel Time:74 msec  +--------------+-------+ +--------------+----------++ +--------------+-----------++ MV E velocity:91.80 cm/s  +--------------+-----------++ MV A velocity:130.00 cm/s +--------------+-----------++ MV E/A ratio: 0.71  +--------------+-----------++  Subjective: And examined she was doing well.  Happy that she is able to be discharged on oral antibiotics today.  Denies chest pain, lightheadedness or dizziness.  Feels well discharge.  No other concerns or complaints at this time and understands that she will be going home with a PICC line for further  chemotherapy  Discharge Exam: Vitals:   02/16/19 2046 02/17/19 0527  BP: 134/64 133/73  Pulse: 91 84  Resp: 14 20  Temp: 97.9 F (36.6 C) 98.1 F (36.7  C)  SpO2: 98% 96%   Vitals:   02/16/19 0553 02/16/19 1358 02/16/19 2046 02/17/19 0527  BP: 133/81 127/77 134/64 133/73  Pulse: 90 91 91 84  Resp: 18 20 14 20   Temp: 99.1 F (37.3 C) 98.3 F (36.8 C) 97.9 F (36.6 C) 98.1 F (36.7 C)  TempSrc: Oral Oral Oral Oral  SpO2: 97% 99% 98% 96%  Weight:      Height:       General: Pt is alert, awake, not in acute distress Cardiovascular: RRR, S1/S2 +, no rubs, no gallops Respiratory: Mildly diminished  bilaterally, no wheezing, no rhonchi Abdominal: Soft, NT, distended secondary body habitus, bowel sounds + Extremities: no edema, no cyanosis; has a right arm pick  The results of significant diagnostics from this hospitalization (including imaging, microbiology, ancillary and laboratory) are listed below for reference.    Microbiology: Recent Results (from the past 240 hour(s))  Culture, blood (Routine x 2)     Status: Abnormal   Collection Time: 02/10/19  9:16 PM   Specimen: BLOOD  Result Value Ref Range Status   Specimen Description   Final    BLOOD RIGHT WRIST Performed at [facility] [address] [place], [place] [address]    Special Requests   Final    BOTTLES DRAWN AEROBIC AND ANAEROBIC Blood Culture adequate volume Performed at [facility] [address] [place], [place] [address]    Culture  Setup Time   Final    GRAM POSITIVE COCCI AEROBIC BOTTLE ONLY CRITICAL RESULT CALLED TO, READ BACK BY AND VERIFIED WITH: DR CAMPBELL [id number] AT 1157 AM BY CM Performed at [facility], [place] [address], [place], [place] [address]    Culture STAPHYLOCOCCUS CAPITIS (A)  Final   Report Status 02/14/2019 FINAL  Final   Organism ID, Bacteria STAPHYLOCOCCUS CAPITIS  Final      Susceptibility   Staphylococcus capitis - MIC*     CIPROFLOXACIN <=0.5 SENSITIVE Sensitive     ERYTHROMYCIN 0.5 SENSITIVE Sensitive     GENTAMICIN <=0.5 SENSITIVE Sensitive     OXACILLIN <=0.25 SENSITIVE Sensitive     TETRACYCLINE <=1 SENSITIVE Sensitive     VANCOMYCIN 1 SENSITIVE Sensitive     TRIMETH/SULFA <=10 SENSITIVE Sensitive     CLINDAMYCIN <=0.25 SENSITIVE Sensitive     RIFAMPIN <=0.5 SENSITIVE Sensitive     Inducible Clindamycin NEGATIVE Sensitive     * STAPHYLOCOCCUS CAPITIS  Culture, blood (Routine x 2)     Status: Abnormal   Collection Time: 02/10/19  9:21 PM   Specimen: BLOOD RIGHT HAND  Result Value Ref Range Status   Specimen Description   Final    BLOOD RIGHT HAND Performed at [facility], [place] [address], [place], [place] [address]    Special Requests   Final    BOTTLES DRAWN AEROBIC AND ANAEROBIC Blood Culture adequate volume Performed at [facility] [address] [place], [place] [address]    Culture  Setup Time   Final    GRAM POSITIVE COCCI IN BOTH AEROBIC AND ANAEROBIC BOTTLES CRITICAL RESULT CALLED TO, READ BACK BY AND VERIFIED WITH: [place] Slider PharmD 17:00 02/11/19 ([email or web address])    Culture (A)  Final    STAPHYLOCOCCUS CAPITIS SUSCEPTIBILITIES PERFORMED ON PREVIOUS CULTURE WITHIN THE LAST 5 DAYS. Performed at [facility], [place] [address] [place], [place] [address]    Report Status 02/14/2019 FINAL  Final  Blood Culture ID Panel (Reflexed)  Status: Abnormal   Collection Time: 02/10/19  9:21 PM  Result Value Ref Range Status   Enterococcus species NOT DETECTED NOT DETECTED Final   Listeria monocytogenes NOT DETECTED NOT DETECTED Final   Staphylococcus species DETECTED (A) NOT DETECTED Final    Comment: Methicillin (oxacillin) susceptible coagulase negative staphylococcus. Possible blood culture contaminant (unless isolated from more than one blood culture draw or clinical case suggests pathogenicity). No antibiotic treatment is indicated for blood  culture  contaminants. CRITICAL RESULT CALLED TO, READ BACK BY AND VERIFIED WITH: Sheffield Slider PharmD 17:00 02/11/19 ([email or web address])    Staphylococcus aureus (BCID) NOT DETECTED NOT DETECTED Final   Methicillin resistance NOT DETECTED NOT DETECTED Final   Streptococcus species NOT DETECTED NOT DETECTED Final   Streptococcus agalactiae NOT DETECTED NOT DETECTED Final   Streptococcus pneumoniae NOT DETECTED NOT DETECTED Final   Streptococcus pyogenes NOT DETECTED NOT DETECTED Final   Acinetobacter baumannii NOT DETECTED NOT DETECTED Final   Enterobacteriaceae species NOT DETECTED NOT DETECTED Final   Enterobacter cloacae complex NOT DETECTED NOT DETECTED Final   Escherichia coli NOT DETECTED NOT DETECTED Final   Klebsiella oxytoca NOT DETECTED NOT DETECTED Final   Klebsiella pneumoniae NOT DETECTED NOT DETECTED Final   Proteus species NOT DETECTED NOT DETECTED Final   Serratia marcescens NOT DETECTED NOT DETECTED Final   Haemophilus influenzae NOT DETECTED NOT DETECTED Final   Neisseria meningitidis NOT DETECTED NOT DETECTED Final   Pseudomonas aeruginosa NOT DETECTED NOT DETECTED Final   Candida albicans NOT DETECTED NOT DETECTED Final   Candida glabrata NOT DETECTED NOT DETECTED Final   Candida krusei NOT DETECTED NOT DETECTED Final   Candida parapsilosis NOT DETECTED NOT DETECTED Final   Candida tropicalis NOT DETECTED NOT DETECTED Final    Comment: Performed at [facility], [address], [place], [place] [address]  SARS Coronavirus 2 [facility] order, Performed in [facility] hospital lab) Nasopharyngeal Nasopharyngeal Swab     Status: None   Collection Time: 02/10/19 11:23 PM   Specimen: Nasopharyngeal Swab  Result Value Ref Range Status   SARS Coronavirus 2 NEGATIVE NEGATIVE Final    Comment: (NOTE) If result is NEGATIVE SARS-CoV-2 target nucleic acids are NOT DETECTED. The SARS-CoV-2 RNA is generally detectable in upper and lower  respiratory specimens during the acute phase of  infection. The lowest  concentration of SARS-CoV-2 viral copies this assay can detect is 250  copies / mL. A negative result does not preclude SARS-CoV-2 infection  and should not be used as the sole basis for treatment or other  patient management decisions.  A negative result may occur with  improper specimen collection / handling, submission of specimen other  than nasopharyngeal swab, presence of viral mutation(s) within the  areas targeted by this assay, and inadequate number of viral copies  (<250 copies / mL). A negative result must be combined with clinical  observations, patient history, and epidemiological information. If result is POSITIVE SARS-CoV-2 target nucleic acids are DETECTED. The SARS-CoV-2 RNA is generally detectable in upper and lower  respiratory specimens dur ing the acute phase of infection.  Positive  results are indicative of active infection with SARS-CoV-2.  Clinical  correlation with patient history and other diagnostic information is  necessary to determine patient infection status.  Positive results do  not rule out bacterial infection or co-infection with other viruses. If result is PRESUMPTIVE POSTIVE SARS-CoV-2 nucleic acids MAY BE PRESENT.   A presumptive positive result was obtained on the submitted specimen  and confirmed on repeat testing.  While 2019 novel coronavirus  (SARS-CoV-2) nucleic acids may be present in the submitted sample  additional confirmatory testing may be necessary for epidemiological  and / or clinical management purposes  to differentiate between  SARS-CoV-2 and other Sarbecovirus currently known to infect humans.  If clinically indicated additional testing with an alternate test  methodology [phone]) is advised. The SARS-CoV-2 RNA is generally  detectable in upper and lower respiratory sp ecimens during the acute  phase of infection. The expected result is Negative. Fact Sheet for Patients:   StrictlyIdeas.no Fact Sheet for Healthcare Providers: BankingDealers.co.za This test is not yet approved or cleared by the [place] FDA and has been authorized for detection and/or diagnosis of SARS-CoV-2 by FDA under an Emergency Use Authorization (EUA).  This EUA will remain in effect (meaning this test can be used) for the duration of the COVID-19 declaration under Section 564(b)(1) of the Act, 21 U.S.C. section 360bbb-3(b)(1), unless the authorization is terminated or revoked sooner. Performed at [facility], [place] [address] [place], [place] [address]   Urine culture     Status: Abnormal   Collection Time: 02/11/19  2:00 AM   Specimen: Urine, Random  Result Value Ref Range Status   Specimen Description   Final    URINE, RANDOM Performed at [facility] [address], [place], [place] [address]    Special Requests   Final    NONE Performed at [facility], [place] [address], [place], [place] [address]    Culture >=100,000 COLONIES/mL ENTEROCOCCUS FAECALIS (A)  Final   Report Status 02/12/2019 FINAL  Final   Organism ID, Bacteria ENTEROCOCCUS FAECALIS (A)  Final      Susceptibility   Enterococcus faecalis - MIC*    AMPICILLIN <=2 SENSITIVE Sensitive     LEVOFLOXACIN 2 SENSITIVE Sensitive     NITROFURANTOIN <=16 SENSITIVE Sensitive     VANCOMYCIN <=0.5 SENSITIVE Sensitive     * >=100,000 COLONIES/mL ENTEROCOCCUS FAECALIS  Culture, sputum-assessment     Status: None   Collection Time: 02/11/19  9:41 AM   Specimen: Sputum  Result Value Ref Range Status   Specimen Description SPUTUM  Final   Special Requests NONE  Final   Sputum evaluation   Final    Sputum specimen not acceptable for testing.  Please recollect.   NOTIFIED Pauline Good [id number] Performed at [facility], [place] [address], [place], [place] [address]    Report Status 02/11/2019 FINAL   Final  Culture, blood (routine x 2)     Status: None   Collection Time: 02/11/19  6:32 PM   Specimen: BLOOD  Result Value Ref Range Status   Specimen Description   Final    BLOOD RIGHT ANTECUBITAL Performed at [facility] [address], [place], [place] [address]    Special Requests   Final    BOTTLES DRAWN AEROBIC ONLY Blood Culture adequate volume Performed at [facility] [address], [place], [place] [address]    Culture   Final    NO GROWTH 5 DAYS Performed at [facility], [place] [address], [place],  [address]    Report Status 02/16/2019 FINAL  Final  Culture, blood (routine x 2)     Status: None   Collection Time: 02/11/19  6:37 PM   Specimen: BLOOD  Result Value Ref Range Status   Specimen Description   Final  BLOOD LEFT ANTECUBITAL Performed at [facility] [address], [place], [place] [address]    Special Requests   Final    BOTTLES DRAWN AEROBIC ONLY Blood Culture adequate volume Performed at [facility] [address], [place], [place] [address]    Culture   Final    NO GROWTH 5 DAYS Performed at [facility], [place] [address], [place],  [address]    Report Status 02/16/2019 FINAL  Final    Labs: BNP (last 3 results) No results for input(s): BNP in the last 8760 hours. Basic Metabolic Panel: Recent Labs  Lab 02/11/19 0923 02/11/19 1413 02/12/19 0532 02/13/19 0529 02/14/19 0401 02/15/19 0433  NA 137 134* 132* 136 138 140  K 3.6 3.5 3.5 3.5 3.4* 4.2  CL 111 103 103 105 106 107  CO2 21* 21* 20* 23 24 26   GLUCOSE 99 127* 124* 93 99 98  BUN 11 11 11 11 9 8   CREATININE 0.77 0.81 0.82 0.72 0.64 0.53  CALCIUM 7.6* 8.2* 7.6* 8.3* 8.4* 8.7*  MG 1.7  --  1.6* 2.0 1.7 1.8  PHOS 4.0  --  2.9 3.0 3.0 3.2   Liver Function Tests: Recent Labs  Lab 02/11/19 0923 02/12/19 0532 02/13/19 0529 02/14/19 0401 02/15/19 0433  AST 21 22 19 25 26   ALT  19 19 16 19 20   ALKPHOS 87 79 72 87 86  BILITOT 0.6 0.8 0.5 0.4 0.7  PROT 5.6* 5.7* 5.6* 5.8* 5.9*  ALBUMIN 3.1* 3.0* 2.8* 2.9* 3.0*   No results for input(s): LIPASE, AMYLASE in the last 168 hours. No results for input(s): AMMONIA in the last 168 hours. CBC: Recent Labs  Lab 02/11/19 1413 02/12/19 0532 02/13/19 0529 02/13/19 2042 02/14/19 0401 02/15/19 0433  WBC 8.0 9.1 7.3  --  5.5 4.5  NEUTROABS 6.7 6.7 4.6  --  3.2 1.9  HGB 7.4* 7.5* 7.3* 8.9* 8.5* 8.4*  HCT 22.9* 22.4* 22.0* 25.6* 25.2* 25.6*  MCV 102.7* 102.8* 101.9*  --  97.7 98.8  PLT 218 210 199  --  202 228   Cardiac Enzymes: No results for input(s): CKTOTAL, CKMB, CKMBINDEX, TROPONINI in the last 168 hours. BNP: Invalid input(s): POCBNP CBG: No results for input(s): GLUCAP in the last 168 hours. D-Dimer No results for input(s): DDIMER in the last 72 hours. Hgb A1c No results for input(s): HGBA1C in the last 72 hours. Lipid Profile No results for input(s): CHOL, HDL, LDLCALC, TRIG, CHOLHDL, LDLDIRECT in the last 72 hours. Thyroid function studies No results for input(s): TSH, T4TOTAL, T3FREE, THYROIDAB in the last 72 hours.  Invalid input(s): FREET3 Anemia work up No results for input(s): VITAMINB12, FOLATE, FERRITIN, TIBC, IRON, RETICCTPCT in the last 72 hours. Urinalysis    Component Value Date/Time   COLORURINE STRAW (A) 02/11/2019 0200   APPEARANCEUR CLEAR 02/11/2019 0200   LABSPEC 1.005 02/11/2019 0200   PHURINE 6.0 02/11/2019 0200   GLUCOSEU NEGATIVE 02/11/2019 0200   HGBUR NEGATIVE 02/11/2019 0200   BILIRUBINUR NEGATIVE 02/11/2019 0200   BILIRUBINUR negative 09/10/2018 1355   BILIRUBINUR Small 10/18/2014 1619   KETONESUR NEGATIVE 02/11/2019 0200   PROTEINUR NEGATIVE 02/11/2019 0200   UROBILINOGEN 0.2 09/10/2018 1355   NITRITE NEGATIVE 02/11/2019 0200   LEUKOCYTESUR MODERATE (A) 02/11/2019 0200   Sepsis Labs Invalid input(s): PROCALCITONIN,  WBC,  LACTICIDVEN Microbiology Recent Results  (from the past 240 hour(s))  Culture, blood (Routine x 2)     Status: Abnormal  Collection Time: 02/10/19  9:16 PM   Specimen: BLOOD  Result Value Ref Range Status   Specimen Description   Final    BLOOD RIGHT WRIST Performed at [facility] [address], [place], [place] [address]    Special Requests   Final    BOTTLES DRAWN AEROBIC AND ANAEROBIC Blood Culture adequate volume Performed at [facility] [address] [place], [place] [address]    Culture  Setup Time   Final    GRAM POSITIVE COCCI AEROBIC BOTTLE ONLY CRITICAL RESULT CALLED TO, READ BACK BY AND VERIFIED WITH: DR CAMPBELL [id number] AT 1157 AM BY CM Performed at [facility], [place] [address] [place], [place] [address]    Culture STAPHYLOCOCCUS CAPITIS (A)  Final   Report Status 02/14/2019 FINAL  Final   Organism ID, Bacteria STAPHYLOCOCCUS CAPITIS  Final      Susceptibility   Staphylococcus capitis - MIC*    CIPROFLOXACIN <=0.5 SENSITIVE Sensitive     ERYTHROMYCIN 0.5 SENSITIVE Sensitive     GENTAMICIN <=0.5 SENSITIVE Sensitive     OXACILLIN <=0.25 SENSITIVE Sensitive     TETRACYCLINE <=1 SENSITIVE Sensitive     VANCOMYCIN 1 SENSITIVE Sensitive     TRIMETH/SULFA <=10 SENSITIVE Sensitive     CLINDAMYCIN <=0.25 SENSITIVE Sensitive     RIFAMPIN <=0.5 SENSITIVE Sensitive     Inducible Clindamycin NEGATIVE Sensitive     * STAPHYLOCOCCUS CAPITIS  Culture, blood (Routine x 2)     Status: Abnormal   Collection Time: 02/10/19  9:21 PM   Specimen: BLOOD RIGHT HAND  Result Value Ref Range Status   Specimen Description   Final    BLOOD RIGHT HAND Performed at [facility], [place] [address], [place], [place] [address]    Special Requests   Final    BOTTLES DRAWN AEROBIC AND ANAEROBIC Blood Culture adequate volume Performed at [facility] [address], [place],  [address]    Culture  Setup Time   Final    GRAM POSITIVE COCCI IN BOTH  AEROBIC AND ANAEROBIC BOTTLES CRITICAL RESULT CALLED TO, READ BACK BY AND VERIFIED WITH: [place] Slider PharmD 17:00 02/11/19 ([email or web address])    Culture (A)  Final    STAPHYLOCOCCUS CAPITIS SUSCEPTIBILITIES PERFORMED ON PREVIOUS CULTURE WITHIN THE LAST 5 DAYS. Performed at [facility], [place] [address], [place],  [address]    Report Status 02/14/2019 FINAL  Final  Blood Culture ID Panel (Reflexed)     Status: Abnormal   Collection Time: 02/10/19  9:21 PM  Result Value Ref Range Status   Enterococcus species NOT DETECTED NOT DETECTED Final   Listeria monocytogenes NOT DETECTED NOT DETECTED Final   Staphylococcus species DETECTED (A) NOT DETECTED Final    Comment: Methicillin (oxacillin) susceptible coagulase negative staphylococcus. Possible blood culture contaminant (unless isolated from more than one blood culture draw or clinical case suggests pathogenicity). No antibiotic treatment is indicated for blood  culture contaminants. CRITICAL RESULT CALLED TO, READ BACK BY AND VERIFIED WITH: [place] Slider PharmD 17:00 02/11/19 ([email or web address])    Staphylococcus aureus (BCID) NOT DETECTED NOT DETECTED Final   Methicillin resistance NOT DETECTED NOT DETECTED Final   Streptococcus species NOT DETECTED NOT DETECTED Final   Streptococcus agalactiae NOT DETECTED NOT DETECTED Final   Streptococcus pneumoniae NOT DETECTED NOT DETECTED Final   Streptococcus pyogenes NOT DETECTED NOT DETECTED Final   Acinetobacter baumannii NOT DETECTED NOT DETECTED Final   Enterobacteriaceae species NOT  DETECTED NOT DETECTED Final   Enterobacter cloacae complex NOT DETECTED NOT DETECTED Final   Escherichia coli NOT DETECTED NOT DETECTED Final   Klebsiella oxytoca NOT DETECTED NOT DETECTED Final   Klebsiella pneumoniae NOT DETECTED NOT DETECTED Final   Proteus species NOT DETECTED NOT DETECTED Final   Serratia marcescens NOT DETECTED NOT DETECTED Final   Haemophilus influenzae NOT DETECTED NOT DETECTED Final   Neisseria  meningitidis NOT DETECTED NOT DETECTED Final   Pseudomonas aeruginosa NOT DETECTED NOT DETECTED Final   Candida albicans NOT DETECTED NOT DETECTED Final   Candida glabrata NOT DETECTED NOT DETECTED Final   Candida krusei NOT DETECTED NOT DETECTED Final   Candida parapsilosis NOT DETECTED NOT DETECTED Final   Candida tropicalis NOT DETECTED NOT DETECTED Final    Comment: Performed at [facility], [place] [address] [place], [place] [address]  SARS Coronavirus 2 [facility] order, Performed in [facility] hospital lab) Nasopharyngeal Nasopharyngeal Swab     Status: None   Collection Time: 02/10/19 11:23 PM   Specimen: Nasopharyngeal Swab  Result Value Ref Range Status   SARS Coronavirus 2 NEGATIVE NEGATIVE Final    Comment: (NOTE) If result is NEGATIVE SARS-CoV-2 target nucleic acids are NOT DETECTED. The SARS-CoV-2 RNA is generally detectable in upper and lower  respiratory specimens during the acute phase of infection. The lowest  concentration of SARS-CoV-2 viral copies this assay can detect is 250  copies / mL. A negative result does not preclude SARS-CoV-2 infection  and should not be used as the sole basis for treatment or other  patient management decisions.  A negative result may occur with  improper specimen collection / handling, submission of specimen other  than nasopharyngeal swab, presence of viral mutation(s) within the  areas targeted by this assay, and inadequate number of viral copies  (<250 copies / mL). A negative result must be combined with clinical  observations, patient history, and epidemiological information. If result is POSITIVE SARS-CoV-2 target nucleic acids are DETECTED. The SARS-CoV-2 RNA is generally detectable in upper and lower  respiratory specimens dur ing the acute phase of infection.  Positive  results are indicative of active infection with SARS-CoV-2.  Clinical  correlation with patient history and other diagnostic information is  necessary  to determine patient infection status.  Positive results do  not rule out bacterial infection or co-infection with other viruses. If result is PRESUMPTIVE POSTIVE SARS-CoV-2 nucleic acids MAY BE PRESENT.   A presumptive positive result was obtained on the submitted specimen  and confirmed on repeat testing.  While 2019 novel coronavirus  (SARS-CoV-2) nucleic acids may be present in the submitted sample  additional confirmatory testing may be necessary for epidemiological  and / or clinical management purposes  to differentiate between  SARS-CoV-2 and other Sarbecovirus currently known to infect humans.  If clinically indicated additional testing with an alternate test  methodology [phone]) is advised. The SARS-CoV-2 RNA is generally  detectable in upper and lower respiratory sp ecimens during the acute  phase of infection. The expected result is Negative. Fact Sheet for Patients:  StrictlyIdeas.no Fact Sheet for Healthcare Providers: BankingDealers.co.za This test is not yet approved or cleared by the [place] FDA and has been authorized for detection and/or diagnosis of SARS-CoV-2 by FDA under an Emergency Use Authorization (EUA).  This EUA will remain in effect (meaning this test can be used) for the duration of the COVID-19 declaration under Section 564(b)(1) of the Act, 21 U.S.C. section 360bbb-3(b)(1), unless  the authorization is terminated or revoked sooner. Performed at [facility], [place] [address] [place], [place] [address]   Urine culture     Status: Abnormal   Collection Time: 02/11/19  2:00 AM   Specimen: Urine, Random  Result Value Ref Range Status   Specimen Description   Final    URINE, RANDOM Performed at [facility] [address] [place], [place] [address]    Special Requests   Final    NONE Performed at [facility], [place] [address],  [place], [place] [address]    Culture >=100,000 COLONIES/mL ENTEROCOCCUS FAECALIS (A)  Final   Report Status 02/12/2019 FINAL  Final   Organism ID, Bacteria ENTEROCOCCUS FAECALIS (A)  Final      Susceptibility   Enterococcus faecalis - MIC*    AMPICILLIN <=2 SENSITIVE Sensitive     LEVOFLOXACIN 2 SENSITIVE Sensitive     NITROFURANTOIN <=16 SENSITIVE Sensitive     VANCOMYCIN <=0.5 SENSITIVE Sensitive     * >=100,000 COLONIES/mL ENTEROCOCCUS FAECALIS  Culture, sputum-assessment     Status: None   Collection Time: 02/11/19  9:41 AM   Specimen: Sputum  Result Value Ref Range Status   Specimen Description SPUTUM  Final   Special Requests NONE  Final   Sputum evaluation   Final    Sputum specimen not acceptable for testing.  Please recollect.   NOTIFIED Pauline Good [id number] Performed at [facility], [place] [address] [place], [place] [address]    Report Status 02/11/2019 FINAL  Final  Culture, blood (routine x 2)     Status: None   Collection Time: 02/11/19  6:32 PM   Specimen: BLOOD  Result Value Ref Range Status   Specimen Description   Final    BLOOD RIGHT ANTECUBITAL Performed at [facility] [address] [place], [place] [address]    Special Requests   Final    BOTTLES DRAWN AEROBIC ONLY Blood Culture adequate volume Performed at [facility] [address], [place], [place] [address]    Culture   Final    NO GROWTH 5 DAYS Performed at [facility], [place] [address], [place], [place] [address]    Report Status 02/16/2019 FINAL  Final  Culture, blood (routine x 2)     Status: None   Collection Time: 02/11/19  6:37 PM   Specimen: BLOOD  Result Value Ref Range Status   Specimen Description   Final    BLOOD LEFT ANTECUBITAL Performed at [facility] [address], [place], [place] [address]    Special Requests   Final    BOTTLES DRAWN AEROBIC ONLY Blood Culture adequate volume Performed at  [facility] [address] [place], [place] [address]    Culture   Final    NO GROWTH 5 DAYS Performed at [facility], [place] [address] [place], [place] [address]    Report Status 02/16/2019 FINAL  Final   Time coordinating discharge: 35 minutes  SIGNED:  Kerney Elbe, DO [facility] 02/17/2019, 11:05 AM Pager is on [place]  If 7PM-7AM, please contact night-coverage [email or web address] Password [email or web address]

## 2019-02-17 NOTE — Telephone Encounter (Signed)
Called patient to remind of HDR Tx. for 02-18-19, spoke with patient and she is now an in-patient, notified Dr. Sondra Come and his nurse Sharee Pimple

## 2019-02-17 NOTE — Progress Notes (Signed)
Patient ID: Zoe Kim, female   DOB: 11-Jun-1945, 73 y.o.   MRN: [id number]         [facility]  Date of Admission:  02/10/2019    Total days of antibiotics 8        Day 4 ceftriaxone         ASSESSMENT: Given the difficulty of arranging outpatient IV antibiotic therapy complete treatment for her methicillin susceptible coagulase-negative staph with oral linezolid.  PLAN: 1. Change ceftriaxone to oral linezolid 600 mg twice daily through 02/27/2019 2. She will follow-up with me on 02/25/2019 3. I will sign off now  Principal Problem:   Coagulase negative Staphylococcus bacteremia Active Problems:   Endometrial cancer (Zoe Kim)   Peripheral neuropathy due to chemotherapy (Zoe Kim)   Anemia due to antineoplastic chemotherapy   Urinary incontinence   Scheduled Meds: . Chlorhexidine Gluconate Cloth  6 each Topical Daily  . enoxaparin (LOVENOX) injection  40 mg Subcutaneous Q24H  . gabapentin  300 mg Oral BID  . sodium chloride flush  3 mL Intravenous Q12H   Continuous Infusions: . sodium chloride 75 mL/hr at 02/13/19 1600  . cefTRIAXone (ROCEPHIN)  IV 2 g (02/16/19 1304)   PRN Meds:.acetaminophen **OR** acetaminophen, guaiFENesin-dextromethorphan, Melatonin, morphine injection, ondansetron **OR** ondansetron (ZOFRAN) IV, polyethylene glycol, sodium chloride flush, traMADol   SUBJECTIVE: Zoe Kim is feeling much better.  She is very eager to go home.  She has not been discharged yet because home health services for IV antibiotics be arranged with her insurance.    Review of Systems: Review of Systems  Constitutional: Negative for chills, diaphoresis and fever.  Gastrointestinal: Negative for abdominal pain, diarrhea, nausea and vomiting.    Allergies  Allergen Reactions  . Sulfa Antibiotics     UNSPECIFIED REACTION     OBJECTIVE: Vitals:   02/16/19 0553 02/16/19 1358 02/16/19 2046 02/17/19 0527  BP: 133/81 127/77 134/64 133/73  Pulse:  90 91 91 84  Resp: 18 20 14 20   Temp: 99.1 F (37.3 C) 98.3 F (36.8 C) 97.9 F (36.6 C) 98.1 F (36.7 C)  TempSrc: Oral Oral Oral Oral  SpO2: 97% 99% 98% 96%  Weight:      Height:       Body mass index is 29.18 kg/m.  Physical Exam Constitutional:      Comments: She is in good spirits.  Cardiovascular:     Rate and Rhythm: Normal rate and regular rhythm.     Heart sounds: No murmur.  Pulmonary:     Effort: Pulmonary effort is normal.     Breath sounds: Normal breath sounds.  Abdominal:     Palpations: Abdomen is soft.     Tenderness: There is no abdominal tenderness.  Psychiatric:        Mood and Affect: Mood normal.     Lab Results Lab Results  Component Value Date   WBC 4.5 02/15/2019   HGB 8.4 (L) 02/15/2019   HCT 25.6 (L) 02/15/2019   MCV 98.8 02/15/2019   PLT 228 02/15/2019    Lab Results  Component Value Date   CREATININE 0.53 02/15/2019   BUN 8 02/15/2019   NA 140 02/15/2019   K 4.2 02/15/2019   CL 107 02/15/2019   CO2 26 02/15/2019    Lab Results  Component Value Date   ALT 20 02/15/2019   AST 26 02/15/2019   ALKPHOS 86 02/15/2019   BILITOT 0.7 02/15/2019     Microbiology: Recent Results (from  the past 240 hour(s))  Culture, blood (Routine x 2)     Status: Abnormal   Collection Time: 02/10/19  9:16 PM   Specimen: BLOOD  Result Value Ref Range Status   Specimen Description   Final    BLOOD RIGHT WRIST Performed at [facility] [address] [place], [place] [address]    Special Requests   Final    BOTTLES DRAWN AEROBIC AND ANAEROBIC Blood Culture adequate volume Performed at [facility] [address] [place], [place] [address]    Culture  Setup Time   Final    GRAM POSITIVE COCCI AEROBIC BOTTLE ONLY CRITICAL RESULT CALLED TO, READ BACK BY AND VERIFIED WITH: DR Zoe Kim [id number] AT 1157 AM BY CM Performed at [facility], [place] [address] [place], [place] [address]    Culture  STAPHYLOCOCCUS CAPITIS (A)  Final   Report Status 02/14/2019 FINAL  Final   Organism ID, Bacteria STAPHYLOCOCCUS CAPITIS  Final      Susceptibility   Staphylococcus capitis - MIC*    CIPROFLOXACIN <=0.5 SENSITIVE Sensitive     ERYTHROMYCIN 0.5 SENSITIVE Sensitive     GENTAMICIN <=0.5 SENSITIVE Sensitive     OXACILLIN <=0.25 SENSITIVE Sensitive     TETRACYCLINE <=1 SENSITIVE Sensitive     VANCOMYCIN 1 SENSITIVE Sensitive     TRIMETH/SULFA <=10 SENSITIVE Sensitive     CLINDAMYCIN <=0.25 SENSITIVE Sensitive     RIFAMPIN <=0.5 SENSITIVE Sensitive     Inducible Clindamycin NEGATIVE Sensitive     * STAPHYLOCOCCUS CAPITIS  Culture, blood (Routine x 2)     Status: Abnormal   Collection Time: 02/10/19  9:21 PM   Specimen: BLOOD RIGHT HAND  Result Value Ref Range Status   Specimen Description   Final    BLOOD RIGHT HAND Performed at [facility], [place] [address] [place], [place] [address]    Special Requests   Final    BOTTLES DRAWN AEROBIC AND ANAEROBIC Blood Culture adequate volume Performed at [facility] [address], [place], [place] [address]    Culture  Setup Time   Final    GRAM POSITIVE COCCI IN BOTH AEROBIC AND ANAEROBIC BOTTLES CRITICAL RESULT CALLED TO, READ BACK BY AND VERIFIED WITH: Sheffield Slider PharmD 17:00 02/11/19 ([email or web address])    Culture (A)  Final    STAPHYLOCOCCUS CAPITIS SUSCEPTIBILITIES PERFORMED ON PREVIOUS CULTURE WITHIN THE LAST 5 DAYS. Performed at [facility], [place] [address] [place], [place] [address]    Report Status 02/14/2019 FINAL  Final  Blood Culture ID Panel (Reflexed)     Status: Abnormal   Collection Time: 02/10/19  9:21 PM  Result Value Ref Range Status   Enterococcus species NOT DETECTED NOT DETECTED Final   Listeria monocytogenes NOT DETECTED NOT DETECTED Final   Staphylococcus species DETECTED (A) NOT DETECTED Final    Comment: Methicillin (oxacillin) susceptible coagulase negative staphylococcus. Possible blood  culture contaminant (unless isolated from more than one blood culture draw or clinical case suggests pathogenicity). No antibiotic treatment is indicated for blood  culture contaminants. CRITICAL RESULT CALLED TO, READ BACK BY AND VERIFIED WITH: Sheffield Slider PharmD 17:00 02/11/19 ([email or web address])    Staphylococcus aureus (BCID) NOT DETECTED NOT DETECTED Final   Methicillin resistance NOT DETECTED NOT DETECTED Final   Streptococcus species NOT DETECTED NOT DETECTED Final   Streptococcus agalactiae NOT DETECTED NOT DETECTED Final   Streptococcus pneumoniae NOT DETECTED NOT DETECTED Final   Streptococcus pyogenes  NOT DETECTED NOT DETECTED Final   Acinetobacter baumannii NOT DETECTED NOT DETECTED Final   Enterobacteriaceae species NOT DETECTED NOT DETECTED Final   Enterobacter cloacae complex NOT DETECTED NOT DETECTED Final   Escherichia coli NOT DETECTED NOT DETECTED Final   Klebsiella oxytoca NOT DETECTED NOT DETECTED Final   Klebsiella pneumoniae NOT DETECTED NOT DETECTED Final   Proteus species NOT DETECTED NOT DETECTED Final   Serratia marcescens NOT DETECTED NOT DETECTED Final   Haemophilus influenzae NOT DETECTED NOT DETECTED Final   Neisseria meningitidis NOT DETECTED NOT DETECTED Final   Pseudomonas aeruginosa NOT DETECTED NOT DETECTED Final   Candida albicans NOT DETECTED NOT DETECTED Final   Candida glabrata NOT DETECTED NOT DETECTED Final   Candida krusei NOT DETECTED NOT DETECTED Final   Candida parapsilosis NOT DETECTED NOT DETECTED Final   Candida tropicalis NOT DETECTED NOT DETECTED Final    Comment: Performed at [facility], [place] [address] [place], [place] [address]  SARS Coronavirus 2 [facility] order, Performed in [facility] hospital lab) Nasopharyngeal Nasopharyngeal Swab     Status: None   Collection Time: 02/10/19 11:23 PM   Specimen: Nasopharyngeal Swab  Result Value Ref Range Status   SARS Coronavirus 2 NEGATIVE NEGATIVE Final    Comment: (NOTE) If result is  NEGATIVE SARS-CoV-2 target nucleic acids are NOT DETECTED. The SARS-CoV-2 RNA is generally detectable in upper and lower  respiratory specimens during the acute phase of infection. The lowest  concentration of SARS-CoV-2 viral copies this assay can detect is 250  copies / mL. A negative result does not preclude SARS-CoV-2 infection  and should not be used as the sole basis for treatment or other  patient management decisions.  A negative result may occur with  improper specimen collection / handling, submission of specimen other  than nasopharyngeal swab, presence of viral mutation(s) within the  areas targeted by this assay, and inadequate number of viral copies  (<250 copies / mL). A negative result must be combined with clinical  observations, patient history, and epidemiological information. If result is POSITIVE SARS-CoV-2 target nucleic acids are DETECTED. The SARS-CoV-2 RNA is generally detectable in upper and lower  respiratory specimens dur ing the acute phase of infection.  Positive  results are indicative of active infection with SARS-CoV-2.  Clinical  correlation with patient history and other diagnostic information is  necessary to determine patient infection status.  Positive results do  not rule out bacterial infection or co-infection with other viruses. If result is PRESUMPTIVE POSTIVE SARS-CoV-2 nucleic acids MAY BE PRESENT.   A presumptive positive result was obtained on the submitted specimen  and confirmed on repeat testing.  While 2019 novel coronavirus  (SARS-CoV-2) nucleic acids may be present in the submitted sample  additional confirmatory testing may be necessary for epidemiological  and / or clinical management purposes  to differentiate between  SARS-CoV-2 and other Sarbecovirus currently known to infect humans.  If clinically indicated additional testing with an alternate test  methodology [phone]) is advised. The SARS-CoV-2 RNA is generally  detectable  in upper and lower respiratory sp ecimens during the acute  phase of infection. The expected result is Negative. Fact Sheet for Patients:  StrictlyIdeas.no Fact Sheet for Healthcare Providers: BankingDealers.co.za This test is not yet approved or cleared by the [place] FDA and has been authorized for detection and/or diagnosis of SARS-CoV-2 by FDA under an Emergency Use Authorization (EUA).  This EUA will remain in effect (meaning this test can be  used) for the duration of the COVID-19 declaration under Section 564(b)(1) of the Act, 21 U.S.C. section 360bbb-3(b)(1), unless the authorization is terminated or revoked sooner. Performed at [facility], [place] [address] [place], [place] [address]   Urine culture     Status: Abnormal   Collection Time: 02/11/19  2:00 AM   Specimen: Urine, Random  Result Value Ref Range Status   Specimen Description   Final    URINE, RANDOM Performed at [facility] [address] [place], [place] [address]    Special Requests   Final    NONE Performed at [facility], [place] [address], [place], [place] [address]    Culture >=100,000 COLONIES/mL ENTEROCOCCUS FAECALIS (A)  Final   Report Status 02/12/2019 FINAL  Final   Organism ID, Bacteria ENTEROCOCCUS FAECALIS (A)  Final      Susceptibility   Enterococcus faecalis - MIC*    AMPICILLIN <=2 SENSITIVE Sensitive     LEVOFLOXACIN 2 SENSITIVE Sensitive     NITROFURANTOIN <=16 SENSITIVE Sensitive     VANCOMYCIN <=0.5 SENSITIVE Sensitive     * >=100,000 COLONIES/mL ENTEROCOCCUS FAECALIS  Culture, sputum-assessment     Status: None   Collection Time: 02/11/19  9:41 AM   Specimen: Sputum  Result Value Ref Range Status   Specimen Description SPUTUM  Final   Special Requests NONE  Final   Sputum evaluation   Final    Sputum specimen not acceptable for testing.  Please recollect.   NOTIFIED Pauline Good [id number] Performed at [facility], [place] [address], [place], [place] [address]    Report Status 02/11/2019 FINAL  Final  Culture, blood (routine x 2)     Status: None   Collection Time: 02/11/19  6:32 PM   Specimen: BLOOD  Result Value Ref Range Status   Specimen Description   Final    BLOOD RIGHT ANTECUBITAL Performed at [facility] [address], [place], [place] [address]    Special Requests   Final    BOTTLES DRAWN AEROBIC ONLY Blood Culture adequate volume Performed at [facility] [address] [place], [place] [address]    Culture   Final    NO GROWTH 5 DAYS Performed at [facility], [place] [address] [place], [place] [address]    Report Status 02/16/2019 FINAL  Final  Culture, blood (routine x 2)     Status: None   Collection Time: 02/11/19  6:37 PM   Specimen: BLOOD  Result Value Ref Range Status   Specimen Description   Final    BLOOD LEFT ANTECUBITAL Performed at [facility] [address], [place], [place] [address]    Special Requests   Final    BOTTLES DRAWN AEROBIC ONLY Blood Culture adequate volume Performed at [facility] [address], [place], [place] [address]    Culture   Final    NO GROWTH 5 DAYS Performed at [facility], [place] [address] [place],  [address]    Report Status 02/16/2019 FINAL  Final    Michel Bickers, MD [facility] [phone] pager   [phone] cell 02/17/2019, 10:13 AM

## 2019-02-17 NOTE — Progress Notes (Signed)
Occupational Therapy Discharge Patient Details Name: BRYTNI DRAY MRN: [id number] DOB: 09-13-1945 Today's Date: 02/17/2019    History of present illness 73yo female c/o fever, fatigue, and general weakness. Covid test negative. Admitted for SIRS. PMH anxiety, endometrial CA currently on radiation and chemo, TKR, peripheral neuropathy   OT comments  Goals met. Pt doing great is is mod I with ADL activity.  Follow Up Recommendations  Home health OT;Supervision/Assistance - 24 hour    Equipment Recommendations  None recommended by OT    Recommendations for Other Services      Precautions / Restrictions Precautions Precautions: Fall Restrictions Weight Bearing Restrictions: No       Mobility Bed Mobility         Supine to sit: Modified independent (Device/Increase time) Sit to supine: Modified independent (Device/Increase time)   General bed mobility comments: HOB elevated   Transfers Overall transfer level: Modified independent                    Balance Overall balance assessment: Needs assistance Sitting-balance support: Bilateral upper extremity supported;Feet supported Sitting balance-Leahy Scale: Good       Standing balance-Leahy Scale: Good                             ADL either performed or assessed with clinical judgement   ADL Overall ADL's : Modified independent                                             Vision Patient Visual Report: No change from baseline            Cognition Arousal/Alertness: Awake/alert Behavior During Therapy: WFL for tasks assessed/performed Overall Cognitive Status: Within Functional Limits for tasks assessed                                 General Comments: very pleasant and cooperative                   Pertinent Vitals/ Pain       Pain Assessment: No/denies pain     Prior Functioning/Environment              Frequency  Min 2X/week         Progress Toward Goals  OT Goals(current goals can now be found in the care plan section)  Progress towards OT goals: Goals met/education completed, patient discharged from [facility] Discharge plan remains appropriate       AM-PAC OT "6 Clicks" Daily Activity     Outcome Measure   Help from another person eating meals?: None Help from another person taking care of personal grooming?: None Help from another person toileting, which includes using toliet, bedpan, or urinal?: None Help from another person bathing (including washing, rinsing, drying)?: None Help from another person to put on and taking off regular upper body clothing?: None Help from another person to put on and taking off regular lower body clothing?: None 6 Click Score: 24    End of Session Equipment Utilized During Treatment: Rolling walker;Gait belt  OT Visit Diagnosis: Unsteadiness on feet (R26.81);Muscle weakness (generalized) (M62.81)   Activity Tolerance Patient tolerated treatment well   Patient Left with call bell/phone within  reach;in chair   Nurse Communication Mobility status        Time: 1030-1054 OT Time Calculation (min): 24 min  Charges: OT General Charges $OT Visit: 1 Visit OT Treatments $Self Care/Home Management : 23-37 mins  Kari Baars, [place] [phone] Office- [phone]      [place], Edwena Felty D 02/17/2019, 1:20 PM

## 2019-02-18 ENCOUNTER — Ambulatory Visit: Payer: PPO | Admitting: Radiation Oncology

## 2019-02-24 ENCOUNTER — Ambulatory Visit: Payer: PPO | Admitting: Adult Health

## 2019-02-24 ENCOUNTER — Inpatient Hospital Stay: Payer: PPO

## 2019-02-24 ENCOUNTER — Telehealth: Payer: Self-pay | Admitting: *Deleted

## 2019-02-24 ENCOUNTER — Other Ambulatory Visit: Payer: PPO

## 2019-02-24 ENCOUNTER — Ambulatory Visit: Payer: PPO

## 2019-02-24 NOTE — Telephone Encounter (Signed)
CALLED PATIENT TO REMIND OF HDR B[facility] 02-25-19 @ 9 AM, SPOKE WITH PATIENT AND SHE IS AWARE OF THIS [place].

## 2019-02-25 ENCOUNTER — Encounter: Payer: Self-pay | Admitting: Internal Medicine

## 2019-02-25 ENCOUNTER — Inpatient Hospital Stay (HOSPITAL_BASED_OUTPATIENT_CLINIC_OR_DEPARTMENT_OTHER): Payer: PPO | Admitting: Hematology and Oncology

## 2019-02-25 ENCOUNTER — Inpatient Hospital Stay: Payer: PPO

## 2019-02-25 ENCOUNTER — Ambulatory Visit
Admission: RE | Admit: 2019-02-25 | Discharge: 2019-02-25 | Disposition: A | Discharge status: Home / Self Care | Payer: PPO | Source: Ambulatory Visit | Attending: Radiation Oncology | Admitting: Radiation Oncology

## 2019-02-25 ENCOUNTER — Other Ambulatory Visit: Payer: Self-pay

## 2019-02-25 ENCOUNTER — Ambulatory Visit (INDEPENDENT_AMBULATORY_CARE_PROVIDER_SITE_OTHER): Payer: PPO | Admitting: Internal Medicine

## 2019-02-25 ENCOUNTER — Encounter: Payer: Self-pay | Admitting: Hematology and Oncology

## 2019-02-25 DIAGNOSIS — R7881 Bacteremia: Secondary | ICD-10-CM | POA: Diagnosis not present

## 2019-02-25 DIAGNOSIS — K5909 Other constipation: Secondary | ICD-10-CM | POA: Diagnosis not present

## 2019-02-25 DIAGNOSIS — Z79899 Other long term (current) drug therapy: Secondary | ICD-10-CM | POA: Diagnosis not present

## 2019-02-25 DIAGNOSIS — C541 Malignant neoplasm of endometrium: Secondary | ICD-10-CM | POA: Diagnosis not present

## 2019-02-25 DIAGNOSIS — R000 Tachycardia, unspecified: Secondary | ICD-10-CM | POA: Diagnosis not present

## 2019-02-25 DIAGNOSIS — Z9221 Personal history of antineoplastic chemotherapy: Secondary | ICD-10-CM | POA: Diagnosis not present

## 2019-02-25 DIAGNOSIS — Z452 Encounter for adjustment and management of vascular access device: Secondary | ICD-10-CM | POA: Diagnosis not present

## 2019-02-25 DIAGNOSIS — R0602 Shortness of breath: Secondary | ICD-10-CM | POA: Diagnosis not present

## 2019-02-25 DIAGNOSIS — R5381 Other malaise: Secondary | ICD-10-CM | POA: Diagnosis not present

## 2019-02-25 DIAGNOSIS — G620 Drug-induced polyneuropathy: Secondary | ICD-10-CM | POA: Diagnosis not present

## 2019-02-25 DIAGNOSIS — Z791 Long term (current) use of non-steroidal anti-inflammatories (NSAID): Secondary | ICD-10-CM | POA: Diagnosis not present

## 2019-02-25 DIAGNOSIS — R2681 Unsteadiness on feet: Secondary | ICD-10-CM | POA: Diagnosis not present

## 2019-02-25 DIAGNOSIS — T451X5A Adverse effect of antineoplastic and immunosuppressive drugs, initial encounter: Secondary | ICD-10-CM | POA: Diagnosis not present

## 2019-02-25 DIAGNOSIS — D61818 Other pancytopenia: Secondary | ICD-10-CM | POA: Diagnosis not present

## 2019-02-25 DIAGNOSIS — Z510 Encounter for antineoplastic radiation therapy: Secondary | ICD-10-CM | POA: Diagnosis not present

## 2019-02-25 MED ORDER — HEPARIN SOD (PORK) LOCK FLUSH 100 UNIT/ML IV SOLN
500.0000 [IU] | Freq: Once | INTRAVENOUS | Status: AC
  Administered 2019-02-25: 08:00:00 500 [IU]
  Filled 2019-02-25: qty 5

## 2019-02-25 MED ORDER — SODIUM CHLORIDE 0.9% FLUSH
10.0000 mL | Freq: Once | INTRAVENOUS | Status: AC
  Administered 2019-02-25: 08:00:00 10 mL
  Filled 2019-02-25: qty 10

## 2019-02-25 NOTE — Assessment & Plan Note (Signed)
She had recurrent hospitalization Her port was extracted and she is on extended doses of antibiotic treatment I will defer to infectious disease team for further management

## 2019-02-25 NOTE — Assessment & Plan Note (Signed)
She still appears somewhat debilitated and appears unsteady on her gait She will be completing radiation therapy soon I recommend resumption of chemotherapy after completion of radiation treatment She will receive 2 more doses of chemotherapy at reduced dose We will keep her PICC line until completion of treatment

## 2019-02-25 NOTE — Progress Notes (Signed)
[place] OFFICE PROGRESS NOTE  Patient Care Team: Lujean Amel, MD as PCP - General (Family Medicine) Calvert Cantor, MD as Consulting Physician (Ophthalmology)  ASSESSMENT & PLAN:  Endometrial cancer [facility]) She still appears somewhat debilitated and appears unsteady on her gait She will be completing radiation therapy soon I recommend resumption of chemotherapy after completion of radiation treatment She will receive 2 more doses of chemotherapy at reduced dose We will keep her PICC line until completion of treatment  Coagulase negative Staphylococcus bacteremia She had recurrent hospitalization Her port was extracted and she is on extended doses of antibiotic treatment I will defer to infectious disease team for further management  Peripheral neuropathy due to chemotherapy [facility]) She has significant neuropathy from treatment I plan to prescribe reduced dose paclitaxel at 50% for future treatment   No orders of the defined types were placed in this encounter.   INTERVAL HISTORY: Please see below for problem oriented charting. 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  The patient reports 6 months or maybe  longer of intermittent vaginal spotting.  She was seen by Dr. [name] on September 19, 2018 who performed a Pap smear which revealed atypical glandular cells and an endocervical biopsy which revealed a benign endocervical polyp, and endometrial biopsy which revealed high-grade serous endometrial adenocarcinoma and adenocarcinoma FIGO grade 2.    10/03/2018 Imaging   CT abdomen and pelvis 1. Enhancing soft tissue density in endometrial cavity, consistent with history of endometrial carcinoma. No evidence of extra uterine tumor extension or metastatic disease. 2. Tiny less than 1 cm anterior uterine fibroid. 3. Small hiatal hernia.  Aortic Atherosclerosis (ICD10-I70.0).   10/14/2018 Pathology Results   1. Lymph node, sentinel, biopsy, right external iliac - METASTATIC CARCINOMA PRESENT IN (1) OF (1) LYMPH NODE. 2. Lymph node, sentinel, biopsy, right deep obturator - ONE LYMPH NODE, NEGATIVE FOR CARCINOMA (0/1). 3. Lymph node, sentinel, biopsy, left external iliac - ONE LYMPH NODE, NEGATIVE FOR CARCINOMA (0/1). 4. Uterus +/- tubes/ovaries, neoplastic, cervix, bilateral fallopian tubes and ovaries - MIXED CELL CARCINOMA OF ENDOMETRIUM (MIXED ENDOMETRIOID CARCINOMA AND SEROUS CARCINOMA), WITH INVASION LESS THAN HALF OF THE MYOMETRIUM. - NO INVOLVEMENT OF UTERINE SEROSA, CERVICAL STROMA OR ADNEXA. - SEE ONCOLOGY TABLE. Microscopic Comment 4. UTERUS, CARCINOMA OR CARCINOSARCOMA Procedure: Total hysterectomy and bilateral salpingo-oophorectomy Histologic type: Mixed cell carcinoma (Endometrioid carcinoma 70% and Serous carcinoma 30%) Histologic Grade: Not applicable Myometrial Invasion: Present Depth of invasion: 6 mm Myometrial thickness: 17 mm Uterine Serosa Involvement: Not identified Cervical Stromal Involvement: Not identified Extent of Involvement of Other Organs: Not involved Lymphovascular Invasion: Present Regional Lymph Nodes: Examined: 3 Sentinel 0 Non-sentinel 3 Total Lymph  nodes with metastases: 1 Isolated tumor cells (< 0.2 mm): 0 Micrometastasis (> 0.2 mm and < 2.0 mm): 0 Macrometastasis (> 2.0 mm): 1 Extracapsular extension:  No definite ECE MMR/MSI Testing: Will be performed and the results reported separately Pathologic Stage Classification (pTNM, AJCC 8th edition): pT1a, pN1a FIGO Stage: III-C1 Representative Tumor Block: 1-A Comment: The size of the metastatic deposit in the right external iliac sentinel lymph node is 32 mm. The metastatic deposit is virtually all serous carcinoma. Intradepartmental consultation was obtained   10/14/2018 Surgery   Pre-operative Diagnosis: endometrial cancer grade 3 (serous), obesity  Operation: Robotic-assisted laparoscopic total hysterectomy with bilateral salpingoophorectomy, SLN,  biopsy (22 modifier for obesity, BMI 32).  Surgeon: Donaciano Eva  Operative Findings:  : 8cm arcuate uterus, normal appearing tubes and ovaries. Obesity with BMI 32. Narrow vaginal introitus.     10/17/2018 Cancer Staging   Staging form: Corpus Uteri - Carcinoma and Carcinosarcoma, AJCC 8th Edition - Pathologic: FIGO Stage IIIC1 (pT1, pN1a, cM0) - Signed by Heath Lark, MD on 10/17/2018    Genetic Testing   Patient has genetic testing done for MMR. Results revealed patient has the following mutation(s): MMR: Normal    Genetic Testing   Patient has genetic testing done for MSI on pathology from 10/14/18. Results revealed patient has the following: MSI Stable   11/06/2018 Procedure   Placement of single lumen port a cath via right internal jugular vein. The catheter tip lies at the cavo-atrial junction. A power injectable port a cath was placed and is ready for immediate use.   11/10/2018 -  Chemotherapy   The patient had carboplatin and taxol for chemotherapy treatment.     11/10/2018 -  Chemotherapy   The patient had carboplatin and taxol for chemotherapy treatment.       REVIEW OF SYSTEMS:   Constitutional: Denies  fevers, chills or abnormal weight loss Eyes: Denies blurriness of vision Ears, nose, mouth, throat, and face: Denies mucositis or sore throat Respiratory: Denies cough, dyspnea or wheezes Cardiovascular: Denies palpitation, chest discomfort or lower extremity swelling Gastrointestinal:  Denies nausea, heartburn or change in bowel habits Skin: Denies abnormal skin rashes Lymphatics: Denies new lymphadenopathy or easy bruising Neurological:Denies numbness, tingling or new weaknesses Behavioral/Psych: Mood is stable, no new changes  All other systems were reviewed with the patient and are negative.  I have reviewed the past medical history, past surgical history, social history and family history with the patient and they are unchanged from previous note.  ALLERGIES:  is allergic to sulfa antibiotics.  MEDICATIONS:  Current Outpatient Medications  Medication Sig Dispense Refill  . acetaminophen (TYLENOL) 500 MG tablet Take 1,000 mg by mouth every 6 (six) hours as needed for moderate pain.    Marland Kitchen gabapentin (NEURONTIN) 300 MG capsule Take 1 capsule (300 mg total) by mouth 2 (two) times daily. 60 capsule 11  . guaiFENesin-dextromethorphan (ROBITUSSIN DM) 100-10 MG/5ML syrup Take 5 mLs by mouth every 4 (four) hours as needed for cough (chest congestion). 118 mL 0  . heparin lock flush 100 UNIT/ML SOLN injection Inject 2.5 mLs (250 Units total) into the vein every Monday, Wednesday, and Friday. 30 mL 0  . MELATONIN PO Take 1 tablet by mouth at bedtime as needed (sleep).    . ondansetron (ZOFRAN) 4 MG tablet Take 1 tablet (4 mg total) by mouth every 6 (six) hours as needed for nausea. 20 tablet 0  . polyethylene glycol (MIRALAX / GLYCOLAX) 17 g packet Take 17 g by mouth daily as needed for mild constipation. 14 each 0  . sodium chloride flush (NS) 0.9 % SOLN 10 mLs by Intracatheter route every Monday, Wednesday,  and Friday for 10 days. 30 mL 0  . traMADol (ULTRAM) 50 MG tablet Take 1 tablet (50 mg  total) by mouth every 6 (six) hours as needed. (Patient taking differently: Take 50 mg by mouth every 6 (six) hours as needed for moderate pain. ) 30 tablet 0   No current facility-administered medications for this visit.     PHYSICAL EXAMINATION: ECOG PERFORMANCE STATUS: 1 - Symptomatic but completely ambulatory  Vitals:   02/25/19 0901  BP: 131/68  Pulse: (!) 117  Resp: 18  Temp: 98.5 F (36.9 C)  SpO2: 100%   Filed Weights   02/25/19 0901  Weight: 177 lb 9.6 oz (80.6 kg)    GENERAL:alert, no distress and comfortable SKIN: skin color, texture, turgor are normal, no rashes or significant lesions EYES: normal, Conjunctiva are pink and non-injected, sclera clear OROPHARYNX:no exudate, no erythema and lips, buccal mucosa, and tongue normal  NECK: supple, thyroid normal size, non-tender, without nodularity LYMPH:  no palpable lymphadenopathy in the cervical, axillary or inguinal LUNGS: clear to auscultation and percussion with normal breathing effort HEART: regular rate & rhythm and no murmurs and no lower extremity edema ABDOMEN:abdomen soft, non-tender and normal bowel sounds Musculoskeletal:no cyanosis of digits and no clubbing  NEURO: alert & oriented x 3 with fluent speech, no focal motor/sensory deficits  LABORATORY DATA:  I have reviewed the data as listed    Component Value Date/Time   NA 140 02/15/2019 0433   NA 141 07/05/2016 1642   K 4.2 02/15/2019 0433   CL 107 02/15/2019 0433   CO2 26 02/15/2019 0433   GLUCOSE 98 02/15/2019 0433   BUN 8 02/15/2019 0433   BUN 10 07/05/2016 1642   CREATININE 0.53 02/15/2019 0433   CREATININE 0.79 02/03/2019 0957   CREATININE 0.86 05/22/2012 1415   CALCIUM 8.7 (L) 02/15/2019 0433   PROT 5.9 (L) 02/15/2019 0433   ALBUMIN 3.0 (L) 02/15/2019 0433   AST 26 02/15/2019 0433   AST 20 02/03/2019 0957   ALT 20 02/15/2019 0433   ALT 15 02/03/2019 0957   ALKPHOS 86 02/15/2019 0433   BILITOT 0.7 02/15/2019 0433   BILITOT 0.5  02/03/2019 0957   GFRNONAA >60 02/15/2019 0433   GFRNONAA >60 02/03/2019 0957   GFRAA >60 02/15/2019 0433   GFRAA >60 02/03/2019 0957    No results found for: SPEP, UPEP  Lab Results  Component Value Date   WBC 4.5 02/15/2019   NEUTROABS 1.9 02/15/2019   HGB 8.4 (L) 02/15/2019   HCT 25.6 (L) 02/15/2019   MCV 98.8 02/15/2019   PLT 228 02/15/2019      Chemistry      Component Value Date/Time   NA 140 02/15/2019 0433   NA 141 07/05/2016 1642   K 4.2 02/15/2019 0433   CL 107 02/15/2019 0433   CO2 26 02/15/2019 0433   BUN 8 02/15/2019 0433   BUN 10 07/05/2016 1642   CREATININE 0.53 02/15/2019 0433   CREATININE 0.79 02/03/2019 0957   CREATININE 0.86 05/22/2012 1415      Component Value Date/Time   CALCIUM 8.7 (L) 02/15/2019 0433   ALKPHOS 86 02/15/2019 0433   AST 26 02/15/2019 0433   AST 20 02/03/2019 0957   ALT 20 02/15/2019 0433   ALT 15 02/03/2019 0957   BILITOT 0.7 02/15/2019 0433   BILITOT 0.5 02/03/2019 0957       RADIOGRAPHIC STUDIES: I have personally reviewed the radiological images as listed and agreed with the findings in  the report. Dg Chest 2 View  Result Date: 02/10/2019 CLINICAL DATA:  Suspected sepsis EXAM: CHEST - 2 VIEW COMPARISON:  December 30, 2018 FINDINGS: The heart size and mediastinal contours are within normal limits. There is a right-sided MediPort catheter with the tip at the superior cavoatrial junction. Both lungs are clear. No acute osseous abnormality. IMPRESSION: No acute cardiopulmonary disease. Electronically Signed   By: Prudencio Pair M.D.   On: 02/10/2019 21:38   Ir Removal [employer] W/ [place] W/o Fl Mod Sed  Result Date: 02/13/2019 INDICATION: 73 year old female with a right IJ approach port catheter which was placed by interventional radiology (Dr. Kathlene Cote) on 11/06/2018. Unfortunately, the patient is currently immunocompromised on chemotherapy with bacteremia and concern for seeding of the port catheter. She presents for port removal  and placement of a PICC so that she may continue her chemotherapy. EXAM: REMOVAL RIGHT IJ VEIN PORT-A-CATH MEDICATIONS: None ANESTHESIA/SEDATION: None FLUOROSCOPY TIME:  None COMPLICATIONS: None immediate. PROCEDURE: Informed written consent was obtained from the patient after a thorough discussion of the procedural risks, benefits and alternatives. All questions were addressed. Maximal Sterile Barrier Technique was utilized including caps, mask, sterile gowns, sterile gloves, sterile drape, hand hygiene and skin antiseptic. A timeout was performed prior to the initiation of the procedure. The right chest was prepped and draped in a sterile fashion. Lidocaine was utilized for local anesthesia. An incision was made over the previously healed surgical incision. Utilizing blunt dissection, the port catheter and reservoir were removed from the underlying subcutaneous tissue in their entirety. Securing sutures were also removed. The pocket was irrigated with a copious amount of sterile normal saline. The pocket was closed with interrupted 3-0 Vicryl stitches. The subcutaneous tissue was closed with 3-0 Vicryl interrupted subcutaneous stitches. A 4-0 Vicryl running subcuticular stitch was utilized to approximate the skin. Dermabond was applied. IMPRESSION: Successful right IJ vein Port-A-Cath explant. No evidence of infection within the reservoir pocket. Electronically Signed   By: Jacqulynn Cadet M.D.   On: 02/13/2019 17:08   Ir Picc Placement Right >5 Yrs Inc Img Guide  Result Date: 02/13/2019 INDICATION: 73 year old female with suspected bacterial seeding of her port catheter and a continued need for chemotherapy. She presents for port catheter removal and simultaneous PICC placement. EXAM: PICC LINE PLACEMENT WITH ULTRASOUND AND FLUOROSCOPIC GUIDANCE MEDICATIONS: None. ANESTHESIA/SEDATION: None. FLUOROSCOPY TIME:  Fluoroscopy Time: 0 minutes 12 seconds (3 mGy). COMPLICATIONS: None immediate. PROCEDURE: The  patient was advised of the possible risks and complications and agreed to undergo the procedure. The patient was then brought to the angiographic suite for the procedure. The right arm was prepped with chlorhexidine, draped in the usual sterile fashion using maximum barrier technique (cap and mask, sterile gown, sterile gloves, large sterile sheet, hand hygiene and cutaneous antisepsis) and infiltrated locally with 1% Lidocaine. Ultrasound demonstrated patency of the right brachial vein, and this was documented with an image. Under real-time ultrasound guidance, this vein was accessed with a 21 gauge micropuncture needle and image documentation was performed. A 0.018 wire was introduced in to the vein. Over this, a 5 [place] single lumen power-injectable PICC was advanced to the lower SVC/right atrial junction. Fluoroscopy during the procedure and fluoro spot radiograph confirms appropriate catheter position. The catheter was flushed and covered with a sterile dressing. Catheter length: 36 cm IMPRESSION: Successful right arm Power PICC line placement with ultrasound and fluoroscopic guidance. The catheter is ready for use. Electronically Signed   By: [name].D.  On: 02/13/2019 17:09    All questions were answered. The patient knows to call the clinic with any problems, questions or concerns. No barriers to learning was detected.  I spent 25 minutes counseling the patient face to face. The total time spent in the appointment was 30 minutes and more than 50% was on counseling and review of test results  Heath Lark, MD 02/25/2019 4:50 PM

## 2019-02-25 NOTE — Assessment & Plan Note (Signed)
She has significant neuropathy from treatment I plan to prescribe reduced dose paclitaxel at 50% for future treatment

## 2019-02-25 NOTE — Progress Notes (Signed)
[facility] for Infectious Disease  Patient Active Problem List   Diagnosis Date Noted   Coagulase negative Staphylococcus bacteremia 02/12/2019    Priority: High   Urinary incontinence 02/13/2019   SIRS (systemic inflammatory response syndrome) ([place]) 02/11/2019   Tachycardia 01/13/2019   Neutropenic fever ([place]) 12/30/2018   Anemia due to antineoplastic chemotherapy 12/24/2018   Mild depression ([place]) 12/03/2018   Other constipation 11/20/2018   Peripheral neuropathy due to chemotherapy ([place]) 11/20/2018   Family history of breast cancer    Family history of lung cancer    Bone pain 11/17/2018   Obesity 10/14/2018   Endometrial cancer ([place]) 10/01/2018   Primary localized osteoarthritis of right knee 07/30/2016   Anxiety    Primary localized osteoarthrosis of the knee, right    Urinary urgency     Patient's Medications  New Prescriptions   No medications on file  Previous Medications   ACETAMINOPHEN (TYLENOL) 500 MG TABLET    Take 1,000 mg by mouth every 6 (six) hours as needed for moderate pain.   GABAPENTIN (NEURONTIN) 300 MG CAPSULE    Take 1 capsule (300 mg total) by mouth 2 (two) times daily.   GUAIFENESIN-DEXTROMETHORPHAN (ROBITUSSIN DM) 100-10 MG/5ML SYRUP    Take 5 mLs by mouth every 4 (four) hours as needed for cough (chest congestion).   HEPARIN LOCK FLUSH 100 UNIT/ML SOLN INJECTION    Inject 2.5 mLs (250 Units total) into the vein every Monday, Wednesday, and Friday.   MELATONIN PO    Take 1 tablet by mouth at bedtime as needed (sleep).   ONDANSETRON (ZOFRAN) 4 MG TABLET    Take 1 tablet (4 mg total) by mouth every 6 (six) hours as needed for nausea.   POLYETHYLENE GLYCOL (MIRALAX / GLYCOLAX) 17 G PACKET    Take 17 g by mouth daily as needed for mild constipation.   SODIUM CHLORIDE FLUSH (NS) 0.9 % SOLN    10 mLs by Intracatheter route every Monday, Wednesday, and Friday for 10 days.   TRAMADOL (ULTRAM) 50 MG TABLET    Take 1 tablet (50  mg total) by mouth every 6 (six) hours as needed.  Modified Medications   No medications on file  Discontinued Medications   LINEZOLID (ZYVOX) 600 MG TABLET    Take 1 tablet (600 mg total) by mouth every 12 (twelve) hours for 10 days.    Subjective: Zoe Kim is in for her hospital follow-up visit.  She has endometrial cancer and has been undergoing chemotherapy and radiation therapy.  Chemotherapy has been on hold for the past month because of anemia.  She had acute onset of fever to 103 degrees and severe fatigue leading to admission 02/10/2019.  She was started on broad empiric antibiotic therapy.  She was not neutropenic.  Her chest x-ray showed no active disease.  Testing for COVID-19 was negative.  UA revealed pyuria but she has no urinary symptoms to suggest a symptomatic UTI.    Both admission blood cultures grew methicillin susceptible staph capitis.  Her Port-A-Cath was removed and a PIC was placed when her repeat blood cultures returned negative.  No vegetations were seen on TTE   The initial plan was to treat her for 2 weeks on IV antibiotics but these arrangements could not be made with her [employer] plan so I switched her to oral linezolid.  She has now completed a little over 2 weeks of total antibiotic therapy.  She  is feeling better but notes some slight increase in her numbness in her feet.  She says that she feels a little bit dizzy when she stands.  She has not had any falls.  Her fever resolved in the hospital and has not recurred.  Review of Systems: Review of Systems  Constitutional: Positive for malaise/fatigue. Negative for chills, diaphoresis and fever.  Respiratory: Negative for cough.   Cardiovascular: Negative for chest pain.  Gastrointestinal: Negative for abdominal pain, diarrhea, nausea and vomiting.  Neurological: Positive for sensory change.    Past Medical History:  Diagnosis Date   Anxiety    Arthritis    Depression    Endometrial ca  [facility])    Family history of breast cancer    Family history of lung cancer    Heart murmur    "years ago"   Insomnia    Pneumonia    "long time ago"   Primary localized osteoarthrosis of the knee, right    Urinary urgency     Social History   Tobacco Use   Smoking status: Never Smoker   Smokeless tobacco: Never Used   Tobacco comment: smoked 2 weeks in college  Substance Use Topics   Alcohol use: No   Drug use: No    Family History  Problem Relation Age of Onset   Heart disease Mother    COPD Mother    Cancer Mother        uterine (possibly, pt unsure)   Breast cancer Mother        dx in 63s   Heart disease Father    COPD Brother    Heart disease Brother    Cancer Brother 63       lung ca, smoker   Cancer Maternal Grandmother        breast dx early 68s   Stroke Maternal Grandfather     Allergies  Allergen Reactions   Sulfa Antibiotics     UNSPECIFIED REACTION     Objective: Vitals:   02/25/19 1121  BP: (!) 143/85  Pulse: (!) 121   There is no height or weight on file to calculate BMI.  Physical Exam Constitutional:      Comments: She is pleasant and in no distress.  Cardiovascular:     Rate and Rhythm: Normal rate and regular rhythm.     Heart sounds: No murmur.  Pulmonary:     Effort: Pulmonary effort is normal.     Breath sounds: Normal breath sounds.  Chest:    Skin:    Findings: No rash.     Comments: Right arm PICC site looks good.     Lab Results    Problem List Items Addressed This Visit      High   Coagulase negative Staphylococcus bacteremia    I am hopeful that her coag negative staph bacteremia has been cured.  I will have her stop linezolid now and follow-up in 4 weeks.  I will leave her PICC line and in case she will restart chemotherapy soon.          Michel Bickers, MD [facility] for Infectious [place] [facility] [phone] pager   [phone] cell 02/25/2019, 12:13 PM

## 2019-02-25 NOTE — Progress Notes (Signed)
  Radiation Oncology         [phone]) [phone] ________________________________  Name: Zoe Kim MRN: [id number]  Date: 02/25/2019  DOB: [birth year]  CC: Lujean Amel, MD  [name], MD  HDR BRACHYTHERAPY NOTE  DIAGNOSIS: 73 y.o. 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 She will be treated with a 2.5 cm diameter segmented cylinder. This conforms to her anatomy without undue discomfort.  Vaginal brachytherapy procedure node: The patient was brought to the [place] suite. Identity was confirmed. All relevant records and images related to the planned course of therapy were reviewed. The patient freely provided informed written consent to proceed with treatment after reviewing the details related to the planned course of therapy. The consent form was witnessed and verified by the simulation staff. Then, the patient was set-up in a stable reproducible supine position for radiation therapy. Pelvic exam revealed the vaginal cuff to be intact. The patient's custom vaginal cylinder was placed in the proximal vagina. This was affixed to the CT/MR stabilization plate to prevent slippage. Patient tolerated the placement well.  Verification simulation note:  A fiducial marker was placed within the vaginal cylinder. An AP and lateral film was then obtained through the pelvis area. This documented accurate position of the vaginal cylinder for treatment.  HDR BRACHYTHERAPY TREATMENT  The remote afterloading device was affixed to the vaginal cylinder by catheter. Patient then proceeded to undergo her fourth high-dose-rate treatment directed at the proximal vagina. The patient was prescribed a dose of 6.0 gray to be delivered to the mucosal surface. Treatment length was 3.0 cm.  Patient was treated with 1 channel using 7 dwell positions. Treatment time was 281.2 seconds. Iridium 192 was the high-dose-rate source for treatment. The patient tolerated the treatment well. After completion of her therapy, a radiation survey was performed documenting return of the iridium source into the [employer] safe.   PLAN: The patient will return next week for her fifth and final high-dose-rate treatment. ________________________________  Blair Promise, PhD, MD   This document serves as a record of services personally performed by Gery Pray, MD. It was created on his behalf by Wilburn Mylar, a trained medical scribe. The creation of this record is based on the scribe's personal observations and the provider's statements to them. This document has been checked and approved by the attending provider.

## 2019-02-25 NOTE — Assessment & Plan Note (Signed)
I am hopeful that her coag negative staph bacteremia has been cured.  I will have her stop linezolid now and follow-up in 4 weeks.  I will leave her PICC line and in case she will restart chemotherapy soon.

## 2019-02-26 ENCOUNTER — Telehealth: Payer: Self-pay

## 2019-02-26 ENCOUNTER — Telehealth: Payer: Self-pay | Admitting: Oncology

## 2019-02-26 ENCOUNTER — Telehealth: Payer: Self-pay | Admitting: Hematology and Oncology

## 2019-02-26 NOTE — Telephone Encounter (Signed)
I left a message regarding schedule  

## 2019-02-26 NOTE — Telephone Encounter (Signed)
Called Zoe Kim and reviewed her upcoming appointments for her last radiation treatment and chemotherapy infusions on 03/06/19 and 03/26/19.  She verbalized understanding and agreement.

## 2019-02-26 NOTE — Telephone Encounter (Signed)
Patient's husband called to confirm how visit went with Dr. Megan Salon on 02/26/19. Husband states that he understood that antibiotics were stopped in hopes of infection was cured. LPN assured Zoe Kim that that information was correct and reminded him of next upcoming appointment. Zoe Kim

## 2019-03-03 ENCOUNTER — Inpatient Hospital Stay: Payer: PPO

## 2019-03-03 ENCOUNTER — Telehealth: Payer: Self-pay | Admitting: *Deleted

## 2019-03-03 NOTE — Telephone Encounter (Signed)
CALLED PATIENT TO REMIND OF HDR TX. FOR 03-04-19 @ 9 AM, LVM FOR A RETURN CALL

## 2019-03-04 ENCOUNTER — Inpatient Hospital Stay: Payer: PPO

## 2019-03-04 ENCOUNTER — Encounter: Payer: Self-pay | Admitting: Radiation Oncology

## 2019-03-04 ENCOUNTER — Ambulatory Visit
Admission: RE | Admit: 2019-03-04 | Discharge: 2019-03-04 | Disposition: A | Discharge status: Home / Self Care | Payer: PPO | Source: Ambulatory Visit | Attending: Radiation Oncology | Admitting: Radiation Oncology

## 2019-03-04 ENCOUNTER — Other Ambulatory Visit: Payer: Self-pay | Admitting: Hematology and Oncology

## 2019-03-04 ENCOUNTER — Other Ambulatory Visit: Payer: Self-pay

## 2019-03-04 DIAGNOSIS — C541 Malignant neoplasm of endometrium: Secondary | ICD-10-CM

## 2019-03-04 DIAGNOSIS — T451X5A Adverse effect of antineoplastic and immunosuppressive drugs, initial encounter: Secondary | ICD-10-CM

## 2019-03-04 DIAGNOSIS — D6481 Anemia due to antineoplastic chemotherapy: Secondary | ICD-10-CM

## 2019-03-04 DIAGNOSIS — Z510 Encounter for antineoplastic radiation therapy: Secondary | ICD-10-CM | POA: Diagnosis not present

## 2019-03-04 LAB — CMP (CANCER CENTER ONLY)
ALT: 20 U/L (ref 0–44)
AST: 22 U/L (ref 15–41)
Albumin: 4.2 g/dL (ref 3.5–5.0)
Alkaline Phosphatase: 134 U/L — ABNORMAL HIGH (ref 38–126)
Anion gap: 11 (ref 5–15)
BUN: 9 mg/dL (ref 8–23)
CO2: 25 mmol/L (ref 22–32)
Calcium: 9.3 mg/dL (ref 8.9–10.3)
Chloride: 103 mmol/L (ref 98–111)
Creatinine: 0.78 mg/dL (ref 0.44–1.00)
GFR, Est AFR Am: >60 mL/min (ref >60)
GFR, Estimated: >60 mL/min (ref >60)
Glucose, Bld: 96 mg/dL (ref 70–99)
Potassium: 4.2 mmol/L (ref 3.5–5.1)
Sodium: 139 mmol/L (ref 135–145)
Total Bilirubin: 0.4 mg/dL (ref 0.3–1.2)
Total Protein: 7 g/dL (ref 6.5–8.1)

## 2019-03-04 LAB — CBC WITH DIFFERENTIAL (CANCER CENTER ONLY)
Abs Immature Granulocytes: 0.06 10*3/uL (ref 0.00–0.07)
Basophils Absolute: 0.1 10*3/uL (ref 0.0–0.1)
Basophils Relative: 1 %
Eosinophils Absolute: 0.4 10*3/uL (ref 0.0–0.5)
Eosinophils Relative: 7 %
HCT: 28 % — ABNORMAL LOW (ref 36.0–46.0)
Hemoglobin: 9.5 g/dL — ABNORMAL LOW (ref 12.0–15.0)
Immature Granulocytes: 1 %
Lymphocytes Relative: 22 %
Lymphs Abs: 1.3 10*3/uL (ref 0.7–4.0)
MCH: 33.1 pg (ref 26.0–34.0)
MCHC: 33.9 g/dL (ref 30.0–36.0)
MCV: 97.6 fL (ref 80.0–100.0)
Monocytes Absolute: 0.8 10*3/uL (ref 0.1–1.0)
Monocytes Relative: 15 %
Neutro Abs: 3.1 10*3/uL (ref 1.7–7.7)
Neutrophils Relative %: 54 %
Platelet Count: 172 10*3/uL (ref 150–400)
RBC: 2.87 MIL/uL — ABNORMAL LOW (ref 3.87–5.11)
RDW: 13.2 % (ref 11.5–15.5)
WBC Count: 5.8 10*3/uL (ref 4.0–10.5)
nRBC: 0 % (ref 0.0–0.2)

## 2019-03-04 LAB — SAMPLE TO BLOOD BANK

## 2019-03-04 MED ORDER — SODIUM CHLORIDE 0.9% FLUSH
10.0000 mL | Freq: Once | INTRAVENOUS | Status: AC
  Administered 2019-03-04: 09:00:00 10 mL
  Filled 2019-03-04: qty 10

## 2019-03-04 MED ORDER — HEPARIN SOD (PORK) LOCK FLUSH 100 UNIT/ML IV SOLN
250.0000 [IU] | Freq: Once | INTRAVENOUS | Status: AC
  Administered 2019-03-04: 09:00:00 250 [IU]
  Filled 2019-03-04: qty 5

## 2019-03-04 NOTE — Progress Notes (Signed)
  Radiation Oncology         [id number]) [phone] ________________________________  Name: Zoe Kim MRN: [id number]  Date: 03/04/2019  DOB: [birth year]  CC: Lujean Amel, MD  [name], MD  HDR BRACHYTHERAPY NOTE  DIAGNOSIS: 73 y.o. 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 She will be treated with a 2.5 cm diameter segmented cylinder. This conforms to her anatomy without undue discomfort.  Vaginal brachytherapy procedure node: The patient was brought to the [facility] suite. Identity was confirmed. All relevant records and images related to the planned course of therapy were reviewed. The patient freely provided informed written consent to proceed with treatment after reviewing the details related to the planned course of therapy. The consent form was witnessed and verified by the simulation staff. Then, the patient was set-up in a stable reproducible supine position for radiation therapy. Pelvic exam revealed the vaginal cuff to be intact. The patient's custom vaginal cylinder was placed in the proximal vagina. This was affixed to the CT/MR stabilization plate to prevent slippage. Patient tolerated the placement well.  Verification simulation note:  A fiducial marker was placed within the vaginal cylinder. An AP and lateral film was then obtained through the pelvis area. This documented accurate position of the vaginal cylinder for treatment.  HDR BRACHYTHERAPY TREATMENT  The remote afterloading device was affixed to the vaginal cylinder by catheter. Patient then proceeded to undergo her fifth high-dose-rate treatment directed at the proximal vagina. The patient was prescribed a dose of 6.0 gray to be delivered to the mucosal surface. Treatment length was 3.0 cm.  Patient was treated with 1 channel using 7 dwell positions. Treatment time was 300.3 seconds. Iridium 192 was the high-dose-rate source for treatment. The patient tolerated the treatment well. After completion of her therapy, a radiation survey was performed documenting return of the iridium source into the [employer] safe.   PLAN: The patient has completed her vaginal brachii therapy course of treatment.  She is scheduled for routine follow-up in 1 month.  She will continue on adjuvant chemotherapy. ________________________________  Blair Promise, PhD, MD   This document serves as a record of services personally performed by Gery Pray, MD. It was created on his behalf by Wilburn Mylar, a trained medical scribe. The creation of this record is based on the scribe's personal observations and the provider's statements to them. This document has been checked and approved by the attending provider.

## 2019-03-06 ENCOUNTER — Inpatient Hospital Stay (HOSPITAL_BASED_OUTPATIENT_CLINIC_OR_DEPARTMENT_OTHER): Payer: PPO | Admitting: Medical

## 2019-03-06 ENCOUNTER — Other Ambulatory Visit: Payer: Self-pay

## 2019-03-06 ENCOUNTER — Telehealth: Payer: Self-pay | Admitting: *Deleted

## 2019-03-06 ENCOUNTER — Inpatient Hospital Stay: Payer: PPO | Attending: Hematology and Oncology

## 2019-03-06 VITALS — BP 140/78 | HR 104 | Temp 98.7°F | Resp 16 | Wt 173.5 lb

## 2019-03-06 DIAGNOSIS — R32 Unspecified urinary incontinence: Secondary | ICD-10-CM | POA: Diagnosis not present

## 2019-03-06 DIAGNOSIS — Z5111 Encounter for antineoplastic chemotherapy: Secondary | ICD-10-CM | POA: Diagnosis not present

## 2019-03-06 DIAGNOSIS — C541 Malignant neoplasm of endometrium: Secondary | ICD-10-CM | POA: Insufficient documentation

## 2019-03-06 DIAGNOSIS — Z9181 History of falling: Secondary | ICD-10-CM | POA: Diagnosis not present

## 2019-03-06 DIAGNOSIS — T451X5A Adverse effect of antineoplastic and immunosuppressive drugs, initial encounter: Secondary | ICD-10-CM | POA: Diagnosis not present

## 2019-03-06 DIAGNOSIS — Z7689 Persons encountering health services in other specified circumstances: Secondary | ICD-10-CM | POA: Diagnosis not present

## 2019-03-06 DIAGNOSIS — G620 Drug-induced polyneuropathy: Secondary | ICD-10-CM | POA: Insufficient documentation

## 2019-03-06 DIAGNOSIS — M25571 Pain in right ankle and joints of right foot: Secondary | ICD-10-CM | POA: Diagnosis not present

## 2019-03-06 DIAGNOSIS — Z79899 Other long term (current) drug therapy: Secondary | ICD-10-CM | POA: Diagnosis not present

## 2019-03-06 DIAGNOSIS — M25572 Pain in left ankle and joints of left foot: Secondary | ICD-10-CM | POA: Insufficient documentation

## 2019-03-06 MED ORDER — DIPHENHYDRAMINE HCL 50 MG/ML IJ SOLN
INTRAMUSCULAR | Status: AC
  Filled 2019-03-06: qty 1

## 2019-03-06 MED ORDER — FAMOTIDINE IN NACL 20-0.9 MG/50ML-% IV SOLN
20.0000 mg | Freq: Once | INTRAVENOUS | Status: AC
  Administered 2019-03-06: 20 mg via INTRAVENOUS

## 2019-03-06 MED ORDER — DIPHENHYDRAMINE HCL 50 MG/ML IJ SOLN
25.0000 mg | Freq: Once | INTRAMUSCULAR | Status: AC
  Administered 2019-03-06: 25 mg via INTRAVENOUS

## 2019-03-06 MED ORDER — SODIUM CHLORIDE 0.9 % IV SOLN
87.5000 mg/m2 | Freq: Once | INTRAVENOUS | Status: AC
  Administered 2019-03-06: 174 mg via INTRAVENOUS
  Filled 2019-03-06: qty 29

## 2019-03-06 MED ORDER — FAMOTIDINE IN NACL 20-0.9 MG/50ML-% IV SOLN
INTRAVENOUS | Status: AC
  Filled 2019-03-06: qty 50

## 2019-03-06 MED ORDER — HEPARIN SOD (PORK) LOCK FLUSH 100 UNIT/ML IV SOLN
500.0000 [IU] | Freq: Once | INTRAVENOUS | Status: AC | PRN
  Administered 2019-03-06: 500 [IU]
  Filled 2019-03-06: qty 5

## 2019-03-06 MED ORDER — SODIUM CHLORIDE 0.9% FLUSH
10.0000 mL | INTRAVENOUS | Status: DC | PRN
  Administered 2019-03-06: 10 mL
  Filled 2019-03-06: qty 10

## 2019-03-06 MED ORDER — PEGFILGRASTIM 6 MG/0.6ML ~~LOC~~ PSKT
6.0000 mg | PREFILLED_SYRINGE | Freq: Once | SUBCUTANEOUS | Status: AC
  Administered 2019-03-06: 6 mg via SUBCUTANEOUS

## 2019-03-06 MED ORDER — PEGFILGRASTIM 6 MG/0.6ML ~~LOC~~ PSKT
PREFILLED_SYRINGE | SUBCUTANEOUS | Status: AC
  Filled 2019-03-06: qty 0.6

## 2019-03-06 MED ORDER — PALONOSETRON HCL INJECTION 0.25 MG/5ML
INTRAVENOUS | Status: AC
  Filled 2019-03-06: qty 5

## 2019-03-06 MED ORDER — PALONOSETRON HCL INJECTION 0.25 MG/5ML
0.2500 mg | Freq: Once | INTRAVENOUS | Status: AC
  Administered 2019-03-06: 0.25 mg via INTRAVENOUS

## 2019-03-06 MED ORDER — SODIUM CHLORIDE 0.9 % IV SOLN
Freq: Once | INTRAVENOUS | Status: AC
  Administered 2019-03-06: 14:00:00 via INTRAVENOUS
  Filled 2019-03-06: qty 5

## 2019-03-06 MED ORDER — SODIUM CHLORIDE 0.9 % IV SOLN
Freq: Once | INTRAVENOUS | Status: AC
  Administered 2019-03-06: 13:00:00 via INTRAVENOUS
  Filled 2019-03-06: qty 250

## 2019-03-06 MED ORDER — SODIUM CHLORIDE 0.9 % IV SOLN
460.5000 mg | Freq: Once | INTRAVENOUS | Status: AC
  Administered 2019-03-06: 460 mg via INTRAVENOUS
  Filled 2019-03-06: qty 46

## 2019-03-06 NOTE — Progress Notes (Signed)
Pt. fell today at home on hardwood floor. Denies complaints of pain and states she didn't notice anything "unusual" or anything "broken" . Gait slightly unsteady, but denies dizziness. PA notified and in for observation/assessment. Pt. viewed Neulasta On Pro video.

## 2019-03-06 NOTE — Patient Instructions (Signed)
[facility] Discharge Instructions for Patients Receiving Chemotherapy  Today you received the following chemotherapy agents:  Paclitaxel, Carboplatin  To help prevent nausea and vomiting after your treatment, we encourage you to take your nausea medication as prescribed.   If you develop nausea and vomiting that is not controlled by your nausea medication, call the clinic.   BELOW ARE SYMPTOMS THAT SHOULD BE REPORTED IMMEDIATELY:  *FEVER GREATER THAN 100.5 F  *CHILLS WITH OR WITHOUT FEVER  NAUSEA AND VOMITING THAT IS NOT CONTROLLED WITH YOUR NAUSEA MEDICATION  *UNUSUAL SHORTNESS OF BREATH  *UNUSUAL BRUISING OR BLEEDING  TENDERNESS IN MOUTH AND THROAT WITH OR WITHOUT PRESENCE OF ULCERS  *URINARY PROBLEMS  *BOWEL PROBLEMS  UNUSUAL RASH Items with * indicate a potential emergency and should be followed up as soon as possible.  Feel free to call the clinic should you have any questions or concerns. The clinic phone number is [phone].  Please show the [facility] at check-in to the Emergency Department and triage nurse.    Pegfilgrastim injection What is this medicine? PEGFILGRASTIM (PEG fil gra stim) is a long-acting granulocyte colony-stimulating factor that stimulates the growth of neutrophils, a type of white blood cell important in the body's fight against infection. It is used to reduce the incidence of fever and infection in patients with certain types of cancer who are receiving chemotherapy that affects the bone marrow, and to increase survival after being exposed to high doses of radiation. This medicine may be used for other purposes; ask your health care provider or pharmacist if you have questions. COMMON BRAND NAME(S): Steve Rattler, Ziextenzo What should I tell my health care provider before I take this medicine? They need to know if you have any of these conditions:  kidney disease  latex allergy  ongoing radiation  therapy  sickle cell disease  skin reactions to acrylic adhesives (On-Body Injector only)  an unusual or allergic reaction to pegfilgrastim, filgrastim, other medicines, foods, dyes, or preservatives  pregnant or trying to get pregnant  breast-feeding How should I use this medicine? This medicine is for injection under the skin. If you get this medicine at home, you will be taught how to prepare and give the pre-filled syringe or how to use the On-body Injector. Refer to the patient Instructions for Use for detailed instructions. Use exactly as directed. Tell your healthcare provider immediately if you suspect that the On-body Injector may not have performed as intended or if you suspect the use of the On-body Injector resulted in a missed or partial dose. It is important that you put your used needles and syringes in a special sharps container. Do not put them in a trash can. If you do not have a sharps container, call your pharmacist or healthcare provider to get one. Talk to your pediatrician regarding the use of this medicine in children. While this drug may be prescribed for selected conditions, precautions do apply. Overdosage: If you think you have taken too much of this medicine contact a poison control center or emergency room at once. NOTE: This medicine is only for you. Do not share this medicine with others. What if I miss a dose? It is important not to miss your dose. Call your doctor or health care professional if you miss your dose. If you miss a dose due to an On-body Injector failure or leakage, a new dose should be administered as soon as possible using a single prefilled syringe for manual  use. What may interact with this medicine? Interactions have not been studied. Give your health care provider a list of all the medicines, herbs, non-prescription drugs, or dietary supplements you use. Also tell them if you smoke, drink alcohol, or use illegal drugs. Some items may interact  with your medicine. This list may not describe all possible interactions. Give your health care provider a list of all the medicines, herbs, non-prescription drugs, or dietary supplements you use. Also tell them if you smoke, drink alcohol, or use illegal drugs. Some items may interact with your medicine. What should I watch for while using this medicine? You may need blood work done while you are taking this medicine. If you are going to need a MRI, CT scan, or other procedure, tell your doctor that you are using this medicine (On-Body Injector only). What side effects may I notice from receiving this medicine? Side effects that you should report to your doctor or health care professional as soon as possible:  allergic reactions like skin rash, itching or hives, swelling of the face, lips, or tongue  back pain  dizziness  fever  pain, redness, or irritation at site where injected  pinpoint red spots on the skin  red or dark-brown urine  shortness of breath or breathing problems  stomach or side pain, or pain at the shoulder  swelling  tiredness  trouble passing urine or change in the amount of urine Side effects that usually do not require medical attention (report to your doctor or health care professional if they continue or are bothersome):  bone pain  muscle pain This list may not describe all possible side effects. Call your doctor for medical advice about side effects. You may report side effects to FDA at 1-800-FDA-1088. Where should I keep my medicine? Keep out of the reach of children. If you are using this medicine at home, you will be instructed on how to store it. Throw away any unused medicine after the expiration date on the label. NOTE: This sheet is a summary. It may not cover all possible information. If you have questions about this medicine, talk to your doctor, pharmacist, or health care provider.  2020 Elsevier/Gold Standard (2017-08-26 16:57:08)

## 2019-03-06 NOTE — Telephone Encounter (Signed)
Received call from patient. She states she has had 2 episodes of diarrhea today and asked if she should still come in for treatment. Today. She denies fever, chills, nausea, vomiting.  She states the first episode was first thing this morning and was quite a large amount of liquid stool.  She did take 2 imodium after that. She had 1 more smaller episode later but none I awhile.  Advised to to follow directions on the medication label. Reminded patient not toe xceed 8 tablets in 1 day.  Advised that she can still come in for her treatment and to let her nurse know if she is having anymore symptoms.  Encouraged pt to increase her po fluid intake to stay well hydrated.  Pt voiced understanding.

## 2019-03-06 NOTE — Progress Notes (Signed)
This patient was seen in the infusion room today.  She had a fall at home prior to her visit.  She reports that she was walking in her kitchen in her slippers when her feet slid causing her to fall.  She inverted both ankles.  She had no other injuries.  A brief physical exam today showed no drawer signs bilaterally.  Range of motion was intact.  Strength is intact.  There was no evidence of gross abnormalities.  No swelling was noted.  The patient was instructed to use an Ace bandage cold packs to the areas as needed.  She was told to use Tylenol or Motrin for pain as needed.  She expressed understanding and agreement with this plan.  Sandi Mealy, MHS, PA-C Physician Assistant

## 2019-03-06 NOTE — Telephone Encounter (Signed)
Made 3 attempts to contact Zoe Kim about her genetic test kit over past several months. Trying to confirm that her new kit arrived and whether or not she is planning to send this in for testing.

## 2019-03-12 ENCOUNTER — Inpatient Hospital Stay: Payer: PPO

## 2019-03-12 ENCOUNTER — Other Ambulatory Visit: Payer: Self-pay

## 2019-03-12 DIAGNOSIS — C541 Malignant neoplasm of endometrium: Secondary | ICD-10-CM

## 2019-03-12 DIAGNOSIS — Z5111 Encounter for antineoplastic chemotherapy: Secondary | ICD-10-CM | POA: Diagnosis not present

## 2019-03-12 MED ORDER — HEPARIN SOD (PORK) LOCK FLUSH 100 UNIT/ML IV SOLN
500.0000 [IU] | Freq: Once | INTRAVENOUS | Status: AC
  Administered 2019-03-12: 10:00:00 250 [IU]
  Filled 2019-03-12: qty 5

## 2019-03-12 MED ORDER — SODIUM CHLORIDE 0.9% FLUSH
10.0000 mL | Freq: Once | INTRAVENOUS | Status: AC
  Administered 2019-03-12: 10:00:00 10 mL
  Filled 2019-03-12: qty 10

## 2019-03-17 ENCOUNTER — Telehealth: Payer: Self-pay | Admitting: Hematology and Oncology

## 2019-03-17 NOTE — Telephone Encounter (Signed)
Returned patient's phone call regarding cancelling an appointment, patient's voicemail was full. Cancelled per patient's request.

## 2019-03-18 ENCOUNTER — Telehealth: Payer: Self-pay

## 2019-03-18 NOTE — Telephone Encounter (Signed)
She called and left a message. Complaining of diaper rash from wearing adult diaper. It is annoying and painful. She also has a rash under breasts and under arms. She is requesting Rx if possible. She uses [facility] in [place].  Called back. Mailbox full.

## 2019-03-18 NOTE — Telephone Encounter (Signed)
She called back. Her voicemail is full and she has trouble using her phone. In the future it would be okay to call her husband. She has tried desitin cream and it did not help. She applied gold bond ultimate healing lotion earlier today and so far it seems to be helping with the rash. Told her Dr. Alvy Bimler is out of the office this afternoon and I will call her back in the morning. She verbalized understanding.

## 2019-03-19 ENCOUNTER — Inpatient Hospital Stay: Payer: PPO

## 2019-03-19 ENCOUNTER — Other Ambulatory Visit: Payer: Self-pay | Admitting: Hematology and Oncology

## 2019-03-19 DIAGNOSIS — B379 Candidiasis, unspecified: Secondary | ICD-10-CM | POA: Insufficient documentation

## 2019-03-19 MED ORDER — NYSTATIN 100000 UNIT/GM EX POWD: Freq: Two times a day (BID) | CUTANEOUS | 0 refills | Status: DC

## 2019-03-19 NOTE — Telephone Encounter (Signed)
They are likely cause by excessive sweating Make sure she keeps those areas dry I will prescribe Nystatin

## 2019-03-19 NOTE — Telephone Encounter (Signed)
Called and left below message on husbands voicemail. Ask them to call the office for questions.

## 2019-03-25 ENCOUNTER — Encounter: Payer: Self-pay | Admitting: Internal Medicine

## 2019-03-25 ENCOUNTER — Other Ambulatory Visit: Payer: Self-pay

## 2019-03-25 ENCOUNTER — Ambulatory Visit (INDEPENDENT_AMBULATORY_CARE_PROVIDER_SITE_OTHER): Payer: PPO | Admitting: Internal Medicine

## 2019-03-25 DIAGNOSIS — B957 Other staphylococcus as the cause of diseases classified elsewhere: Secondary | ICD-10-CM | POA: Diagnosis not present

## 2019-03-25 DIAGNOSIS — R7881 Bacteremia: Secondary | ICD-10-CM

## 2019-03-25 NOTE — Assessment & Plan Note (Signed)
Her staph bacteremia has resolved following removal of her Port-A-Cath and antibiotic therapy.  She should have her PICC line removed as soon as she no longer needs it.  I will leave that decision up to her oncologist, Dr. Alvy Bimler.  She can follow-up here as needed.

## 2019-03-25 NOTE — Progress Notes (Signed)
[facility] for Infectious Disease  Patient Active Problem List   Diagnosis Date Noted  . Coagulase negative Staphylococcus bacteremia 02/12/2019    Priority: High  . Yeast infection 03/19/2019  . Urinary incontinence 02/13/2019  . SIRS (systemic inflammatory response syndrome) ([place]) 02/11/2019  . Tachycardia 01/13/2019  . Neutropenic fever ([place]) 12/30/2018  . Anemia due to antineoplastic chemotherapy 12/24/2018  . Mild depression ([place]) 12/03/2018  . Other constipation 11/20/2018  . Peripheral neuropathy due to chemotherapy ([place]) 11/20/2018  . Family history of breast cancer   . Family history of lung cancer   . Bone pain 11/17/2018  . Obesity 10/14/2018  . Endometrial cancer ([employer]) 10/01/2018  . Primary localized osteoarthritis of right knee 07/30/2016  . Anxiety   . Primary localized osteoarthrosis of the knee, right   . Urinary urgency     Patient's Medications  New Prescriptions   No medications on file  Previous Medications   ACETAMINOPHEN (TYLENOL) 500 MG TABLET    Take 1,000 mg by mouth every 6 (six) hours as needed for moderate pain.   GABAPENTIN (NEURONTIN) 300 MG CAPSULE    Take 1 capsule (300 mg total) by mouth 2 (two) times daily.   GUAIFENESIN-DEXTROMETHORPHAN (ROBITUSSIN DM) 100-10 MG/5ML SYRUP    Take 5 mLs by mouth every 4 (four) hours as needed for cough (chest congestion).   HEPARIN LOCK FLUSH 100 UNIT/ML SOLN INJECTION    Inject 2.5 mLs (250 Units total) into the vein every Monday, Wednesday, and Friday.   MELATONIN PO    Take 1 tablet by mouth at bedtime as needed (sleep).   NYSTATIN (MYCOSTATIN/NYSTOP) POWDER    Apply topically 2 (two) times daily.   ONDANSETRON (ZOFRAN) 4 MG TABLET    Take 1 tablet (4 mg total) by mouth every 6 (six) hours as needed for nausea.   POLYETHYLENE GLYCOL (MIRALAX / GLYCOLAX) 17 G PACKET    Take 17 g by mouth daily as needed for mild constipation.   TRAMADOL (ULTRAM) 50 MG TABLET    Take 1 tablet (50 mg total) by  mouth every 6 (six) hours as needed.  Modified Medications   No medications on file  Discontinued Medications   No medications on file    Subjective: Zoe Kim is in for her routine follow-up visit.  She has endometrial cancer and has been undergoing chemotherapy and radiation therapy.  She had acute onset of fever to 103 degrees and severe fatigue leading to admission 02/10/2019.  She was started on broad empiric antibiotic therapy.  She was not neutropenic.  Her chest x-ray showed no active disease.  Testing for COVID-19 was negative.  UA revealed pyuria but she has no urinary symptoms to suggest a symptomatic UTI. Both admission blood cultures grew methicillin susceptible staph capitis.  Her Port-A-Cath was removed and a PICC was placed when her repeat blood cultures returned negative.  No vegetations were seen on TTE   The initial plan was to treat her for 2 weeks on IV antibiotics but these arrangements could not be made with her [employer] plan so I switched her to oral linezolid.  She completed a little over 2 weeks of total antibiotic therapy on 02/25/2019.  She continues to make slow improvement.  She has had no further fever.  She has had no problems tolerating her PICC.  She tells me that she completes her last chemotherapy infusion tomorrow.  She says that she has struggled with lifelong  depression and that it has been a little bit worse recently.  She plans to start exercising more at home with her husband.  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  She plans on reaching out to her counselor.  Review of Systems: Review of Systems  Constitutional: Positive for malaise/fatigue. Negative for chills, diaphoresis and fever.  Respiratory: Negative for cough.   Cardiovascular: Negative for chest pain.  Gastrointestinal: Negative for abdominal  pain, diarrhea, nausea and vomiting.  Psychiatric/Behavioral: Positive for depression.    Past Medical History:  Diagnosis Date  . Anxiety   . Arthritis   . Depression   . Endometrial ca ([place])   . Family history of breast cancer   . Family history of lung cancer   . Heart murmur    "years ago"  . Insomnia   . Pneumonia    "long time ago"  . Primary localized osteoarthrosis of the knee, right   . Urinary urgency     Social History   Tobacco Use  . Smoking status: Never Smoker  . Smokeless tobacco: Never Used  . Tobacco comment: smoked 2 weeks in college  Substance Use Topics  . Alcohol use: No  . Drug use: No    Family History  Problem Relation Age of Onset  . Heart disease Mother   . COPD Mother   . Cancer Mother        uterine (possibly, pt unsure)  . Breast cancer Mother        dx in 66s  . Heart disease Father   . COPD Brother   . Heart disease Brother   . Cancer Brother 67       lung ca, smoker  . Cancer Maternal Grandmother        breast dx early 81s  . Stroke Maternal Grandfather     Allergies  Allergen Reactions  . Sulfa Antibiotics     UNSPECIFIED REACTION     Objective: Vitals:   03/25/19 1429  BP: 122/81  Pulse: (!) 106  Temp: 98.1 F (36.7 C)  Weight: 176 lb (79.8 kg)   Body mass index is 30.21 kg/m.  Physical Exam Constitutional:      Comments: She is pleasant and in no distress.  Cardiovascular:     Rate and Rhythm: Normal rate and regular rhythm.     Heart sounds: No murmur.  Pulmonary:     Effort: Pulmonary effort is normal.     Breath sounds: Normal breath sounds.  Chest:    Skin:    Findings: No rash.     Comments: Right arm PICC site looks good.     Lab Results    Problem List Items Addressed This Visit      High   Coagulase negative Staphylococcus bacteremia    Her staph bacteremia has resolved following removal of her Port-A-Cath and antibiotic therapy.  She should have her PICC line removed as soon as  she no longer needs it.  I will leave that decision up to her oncologist, Dr. Alvy Bimler.  She can follow-up here as needed.          Zoe Bickers, MD [facility] [phone] pager   [phone] cell 03/25/2019, 2:57 PM

## 2019-03-26 ENCOUNTER — Encounter: Payer: Self-pay | Admitting: Hematology and Oncology

## 2019-03-26 ENCOUNTER — Inpatient Hospital Stay: Payer: PPO

## 2019-03-26 ENCOUNTER — Other Ambulatory Visit: Payer: Self-pay | Admitting: Hematology and Oncology

## 2019-03-26 ENCOUNTER — Inpatient Hospital Stay (HOSPITAL_BASED_OUTPATIENT_CLINIC_OR_DEPARTMENT_OTHER): Payer: PPO | Admitting: Hematology and Oncology

## 2019-03-26 ENCOUNTER — Other Ambulatory Visit: Payer: Self-pay

## 2019-03-26 VITALS — BP 145/80 | HR 114 | Temp 98.5°F | Resp 18 | Ht 64.0 in | Wt 175.0 lb

## 2019-03-26 VITALS — BP 128/67 | HR 102 | Temp 97.9°F | Resp 16

## 2019-03-26 DIAGNOSIS — T451X5A Adverse effect of antineoplastic and immunosuppressive drugs, initial encounter: Secondary | ICD-10-CM

## 2019-03-26 DIAGNOSIS — C541 Malignant neoplasm of endometrium: Secondary | ICD-10-CM

## 2019-03-26 DIAGNOSIS — G620 Drug-induced polyneuropathy: Secondary | ICD-10-CM | POA: Diagnosis not present

## 2019-03-26 DIAGNOSIS — Z5111 Encounter for antineoplastic chemotherapy: Secondary | ICD-10-CM | POA: Diagnosis not present

## 2019-03-26 DIAGNOSIS — R32 Unspecified urinary incontinence: Secondary | ICD-10-CM | POA: Diagnosis not present

## 2019-03-26 DIAGNOSIS — R7881 Bacteremia: Secondary | ICD-10-CM | POA: Diagnosis not present

## 2019-03-26 DIAGNOSIS — D6481 Anemia due to antineoplastic chemotherapy: Secondary | ICD-10-CM

## 2019-03-26 DIAGNOSIS — B957 Other staphylococcus as the cause of diseases classified elsewhere: Secondary | ICD-10-CM | POA: Diagnosis not present

## 2019-03-26 LAB — CMP (CANCER CENTER ONLY)
ALT: 17 U/L (ref 0–44)
AST: 20 U/L (ref 15–41)
Albumin: 4 g/dL (ref 3.5–5.0)
Alkaline Phosphatase: 142 U/L — ABNORMAL HIGH (ref 38–126)
Anion gap: 10 (ref 5–15)
BUN: 10 mg/dL (ref 8–23)
CO2: 25 mmol/L (ref 22–32)
Calcium: 9.3 mg/dL (ref 8.9–10.3)
Chloride: 104 mmol/L (ref 98–111)
Creatinine: 0.78 mg/dL (ref 0.44–1.00)
GFR, Est AFR Am: >60 mL/min (ref >60)
GFR, Estimated: >60 mL/min (ref >60)
Glucose, Bld: 95 mg/dL (ref 70–99)
Potassium: 4.4 mmol/L (ref 3.5–5.1)
Sodium: 139 mmol/L (ref 135–145)
Total Bilirubin: 0.4 mg/dL (ref 0.3–1.2)
Total Protein: 7.1 g/dL (ref 6.5–8.1)

## 2019-03-26 LAB — CBC WITH DIFFERENTIAL (CANCER CENTER ONLY)
Abs Immature Granulocytes: 0.09 10*3/uL — ABNORMAL HIGH (ref 0.00–0.07)
Basophils Absolute: 0.1 10*3/uL (ref 0.0–0.1)
Basophils Relative: 1 %
Eosinophils Absolute: 0.1 10*3/uL (ref 0.0–0.5)
Eosinophils Relative: 2 %
HCT: 27.5 % — ABNORMAL LOW (ref 36.0–46.0)
Hemoglobin: 9.2 g/dL — ABNORMAL LOW (ref 12.0–15.0)
Immature Granulocytes: 2 %
Lymphocytes Relative: 23 %
Lymphs Abs: 1.2 10*3/uL (ref 0.7–4.0)
MCH: 32.3 pg (ref 26.0–34.0)
MCHC: 33.5 g/dL (ref 30.0–36.0)
MCV: 96.5 fL (ref 80.0–100.0)
Monocytes Absolute: 1.1 10*3/uL — ABNORMAL HIGH (ref 0.1–1.0)
Monocytes Relative: 20 %
Neutro Abs: 2.9 10*3/uL (ref 1.7–7.7)
Neutrophils Relative %: 52 %
Platelet Count: 217 10*3/uL (ref 150–400)
RBC: 2.85 MIL/uL — ABNORMAL LOW (ref 3.87–5.11)
RDW: 13.6 % (ref 11.5–15.5)
WBC Count: 5.4 10*3/uL (ref 4.0–10.5)
nRBC: 0 % (ref 0.0–0.2)

## 2019-03-26 LAB — SAMPLE TO BLOOD BANK

## 2019-03-26 MED ORDER — PALONOSETRON HCL INJECTION 0.25 MG/5ML
0.2500 mg | Freq: Once | INTRAVENOUS | Status: AC
  Administered 2019-03-26: 0.25 mg via INTRAVENOUS

## 2019-03-26 MED ORDER — SODIUM CHLORIDE 0.9 % IV SOLN
Freq: Once | INTRAVENOUS | Status: AC
  Administered 2019-03-26: 10:00:00 via INTRAVENOUS
  Filled 2019-03-26: qty 250

## 2019-03-26 MED ORDER — NYSTATIN 100000 UNIT/GM EX POWD: Freq: Two times a day (BID) | CUTANEOUS | 0 refills | Status: DC

## 2019-03-26 MED ORDER — FAMOTIDINE IN NACL 20-0.9 MG/50ML-% IV SOLN
20.0000 mg | Freq: Once | INTRAVENOUS | Status: AC
  Administered 2019-03-26: 20 mg via INTRAVENOUS

## 2019-03-26 MED ORDER — DIPHENHYDRAMINE HCL 50 MG/ML IJ SOLN
INTRAMUSCULAR | Status: AC
  Filled 2019-03-26: qty 1

## 2019-03-26 MED ORDER — PALONOSETRON HCL INJECTION 0.25 MG/5ML
INTRAVENOUS | Status: AC
  Filled 2019-03-26: qty 5

## 2019-03-26 MED ORDER — PEGFILGRASTIM 6 MG/0.6ML ~~LOC~~ PSKT
PREFILLED_SYRINGE | SUBCUTANEOUS | Status: AC
  Filled 2019-03-26: qty 0.6

## 2019-03-26 MED ORDER — SODIUM CHLORIDE 0.9% FLUSH
10.0000 mL | INTRAVENOUS | Status: DC | PRN
  Administered 2019-03-26: 10 mL
  Filled 2019-03-26: qty 10

## 2019-03-26 MED ORDER — FAMOTIDINE IN NACL 20-0.9 MG/50ML-% IV SOLN
INTRAVENOUS | Status: AC
  Filled 2019-03-26: qty 50

## 2019-03-26 MED ORDER — SODIUM CHLORIDE 0.9 % IV SOLN
460.5000 mg | Freq: Once | INTRAVENOUS | Status: AC
  Administered 2019-03-26: 460 mg via INTRAVENOUS
  Filled 2019-03-26: qty 46

## 2019-03-26 MED ORDER — SODIUM CHLORIDE 0.9 % IV SOLN
Freq: Once | INTRAVENOUS | Status: AC
  Administered 2019-03-26: 11:00:00 via INTRAVENOUS
  Filled 2019-03-26: qty 5

## 2019-03-26 MED ORDER — PEGFILGRASTIM 6 MG/0.6ML ~~LOC~~ PSKT
6.0000 mg | PREFILLED_SYRINGE | Freq: Once | SUBCUTANEOUS | Status: AC
  Administered 2019-03-26: 6 mg via SUBCUTANEOUS

## 2019-03-26 MED ORDER — SODIUM CHLORIDE 0.9% FLUSH
10.0000 mL | Freq: Once | INTRAVENOUS | Status: AC
  Administered 2019-03-26: 10 mL
  Filled 2019-03-26: qty 10

## 2019-03-26 MED ORDER — SODIUM CHLORIDE 0.9 % IV SOLN
87.5000 mg/m2 | Freq: Once | INTRAVENOUS | Status: AC
  Administered 2019-03-26: 174 mg via INTRAVENOUS
  Filled 2019-03-26: qty 29

## 2019-03-26 MED ORDER — DIPHENHYDRAMINE HCL 50 MG/ML IJ SOLN
25.0000 mg | Freq: Once | INTRAMUSCULAR | Status: AC
  Administered 2019-03-26: 25 mg via INTRAVENOUS

## 2019-03-26 NOTE — Assessment & Plan Note (Signed)
I have reviewed recommendation from infectious disease She will receive G-CSF support after today's treatment We will get her PICC line removed after today's treatment

## 2019-03-26 NOTE — Patient Instructions (Signed)
[facility] Discharge Instructions for Patients Receiving Chemotherapy  Today you received the following chemotherapy agents:  Paclitaxel, Carboplatin  To help prevent nausea and vomiting after your treatment, we encourage you to take your nausea medication as prescribed.   If you develop nausea and vomiting that is not controlled by your nausea medication, call the clinic.   BELOW ARE SYMPTOMS THAT SHOULD BE REPORTED IMMEDIATELY:  *FEVER GREATER THAN 100.5 F  *CHILLS WITH OR WITHOUT FEVER  NAUSEA AND VOMITING THAT IS NOT CONTROLLED WITH YOUR NAUSEA MEDICATION  *UNUSUAL SHORTNESS OF BREATH  *UNUSUAL BRUISING OR BLEEDING  TENDERNESS IN MOUTH AND THROAT WITH OR WITHOUT PRESENCE OF ULCERS  *URINARY PROBLEMS  *BOWEL PROBLEMS  UNUSUAL RASH Items with * indicate a potential emergency and should be followed up as soon as possible.  Feel free to call the clinic should you have any questions or concerns. The clinic phone number is [phone].  Please show the [facility] at check-in to the Emergency Department and triage nurse.    Pegfilgrastim injection What is this medicine? PEGFILGRASTIM (PEG fil gra stim) is a long-acting granulocyte colony-stimulating factor that stimulates the growth of neutrophils, a type of white blood cell important in the body's fight against infection. It is used to reduce the incidence of fever and infection in patients with certain types of cancer who are receiving chemotherapy that affects the bone marrow, and to increase survival after being exposed to high doses of radiation. This medicine may be used for other purposes; ask your health care provider or pharmacist if you have questions. COMMON BRAND NAME(S): Steve Rattler, Ziextenzo What should I tell my health care provider before I take this medicine? They need to know if you have any of these conditions:  kidney disease  latex allergy  ongoing radiation  therapy  sickle cell disease  skin reactions to acrylic adhesives (On-Body Injector only)  an unusual or allergic reaction to pegfilgrastim, filgrastim, other medicines, foods, dyes, or preservatives  pregnant or trying to get pregnant  breast-feeding How should I use this medicine? This medicine is for injection under the skin. If you get this medicine at home, you will be taught how to prepare and give the pre-filled syringe or how to use the On-body Injector. Refer to the patient Instructions for Use for detailed instructions. Use exactly as directed. Tell your healthcare provider immediately if you suspect that the On-body Injector may not have performed as intended or if you suspect the use of the On-body Injector resulted in a missed or partial dose. It is important that you put your used needles and syringes in a special sharps container. Do not put them in a trash can. If you do not have a sharps container, call your pharmacist or healthcare provider to get one. Talk to your pediatrician regarding the use of this medicine in children. While this drug may be prescribed for selected conditions, precautions do apply. Overdosage: If you think you have taken too much of this medicine contact a poison control center or emergency room at once. NOTE: This medicine is only for you. Do not share this medicine with others. What if I miss a dose? It is important not to miss your dose. Call your doctor or health care professional if you miss your dose. If you miss a dose due to an On-body Injector failure or leakage, a new dose should be administered as soon as possible using a single prefilled syringe for manual  use. What may interact with this medicine? Interactions have not been studied. Give your health care provider a list of all the medicines, herbs, non-prescription drugs, or dietary supplements you use. Also tell them if you smoke, drink alcohol, or use illegal drugs. Some items may interact  with your medicine. This list may not describe all possible interactions. Give your health care provider a list of all the medicines, herbs, non-prescription drugs, or dietary supplements you use. Also tell them if you smoke, drink alcohol, or use illegal drugs. Some items may interact with your medicine. What should I watch for while using this medicine? You may need blood work done while you are taking this medicine. If you are going to need a MRI, CT scan, or other procedure, tell your doctor that you are using this medicine (On-Body Injector only). What side effects may I notice from receiving this medicine? Side effects that you should report to your doctor or health care professional as soon as possible:  allergic reactions like skin rash, itching or hives, swelling of the face, lips, or tongue  back pain  dizziness  fever  pain, redness, or irritation at site where injected  pinpoint red spots on the skin  red or dark-brown urine  shortness of breath or breathing problems  stomach or side pain, or pain at the shoulder  swelling  tiredness  trouble passing urine or change in the amount of urine Side effects that usually do not require medical attention (report to your doctor or health care professional if they continue or are bothersome):  bone pain  muscle pain This list may not describe all possible side effects. Call your doctor for medical advice about side effects. You may report side effects to FDA at 1-800-FDA-1088. Where should I keep my medicine? Keep out of the reach of children. If you are using this medicine at home, you will be instructed on how to store it. Throw away any unused medicine after the expiration date on the label. NOTE: This sheet is a summary. It may not cover all possible information. If you have questions about this medicine, talk to your doctor, pharmacist, or health care provider.  2020 Elsevier/Gold Standard (2017-08-26 16:57:08)

## 2019-03-26 NOTE — Assessment & Plan Note (Signed)
She tolerated last cycle of treatment well She still have significant neuropathy from prior treatment We will proceed with similar dose adjustment as before We will get her PICC line removed at the end of treatment today I plan to order CT imaging next month for objective assessment of response to treatment

## 2019-03-26 NOTE — Assessment & Plan Note (Signed)
She is not symptomatic We will proceed with treatment with similar dose adjustment as before

## 2019-03-26 NOTE — Assessment & Plan Note (Signed)
She has significant neuropathy from treatment I plan to continue on reduced dose treatment as before

## 2019-03-26 NOTE — Progress Notes (Signed)
Telephone call to infusion nurse for the patient today to advise order for picc line removal to be completed following treatment today. Patient is not to go home with Picc.

## 2019-03-26 NOTE — Progress Notes (Signed)
There was no large BIOPATCH to be used, per Delle Reining, RN she stated to use the small BIOPATCH and make a note.

## 2019-03-26 NOTE — Progress Notes (Signed)
[facility] OFFICE PROGRESS NOTE  Patient Care Team: Koirala, Dibas, MD as PCP - General (Family Medicine) Digby, Donald, MD as Consulting Physician (Ophthalmology)  ASSESSMENT & PLAN:  Endometrial cancer (HCC) She tolerated last cycle of treatment well She still have significant neuropathy from prior treatment We will proceed with similar dose adjustment as before We will get her PICC line removed at the end of treatment today I plan to order CT imaging next month for objective assessment of response to treatment  Anemia due to antineoplastic chemotherapy She is not symptomatic We will proceed with treatment with similar dose adjustment as before  Coagulase negative Staphylococcus bacteremia I have reviewed recommendation from infectious disease She will receive G-CSF support after today's treatment We will get her PICC line removed after today's treatment  Peripheral neuropathy due to chemotherapy (HCC) She has significant neuropathy from treatment I plan to continue on reduced dose treatment as before  Urinary incontinence She has chronic urinary incontinence since surgery She is also debilitated from neuropathy I recommend referral to physical therapy and rehab at the cancer rehab facility after treatment and she agreed   Orders Placed This Encounter  Procedures  . Ambulatory referral to Physical Therapy    Referral Priority:   Routine    Referral Type:   Physical Medicine    Referral Reason:   Specialty Services Required    Requested Specialty:   Physical Therapy    Number of Visits Requested:   1    INTERVAL HISTORY: Please see below for problem oriented charting. 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  The patient reports 6 months or maybe longer of intermittent vaginal spotting.  She was seen by Dr. Lavoie on September 19, 2018 who performed a Pap smear which revealed atypical glandular cells and an endocervical biopsy which revealed a benign endocervical polyp, and endometrial biopsy which revealed high-grade serous endometrial adenocarcinoma and adenocarcinoma FIGO grade 2.    10/03/2018 Imaging   CT abdomen and pelvis 1. Enhancing soft tissue density in endometrial cavity, consistent with history of endometrial carcinoma. No evidence of extra uterine tumor extension or metastatic disease. 2. Tiny less than 1 cm anterior uterine fibroid. 3. Small hiatal hernia.  Aortic Atherosclerosis (ICD10-I70.0).   10/14/2018 Pathology Results   1. Lymph node, sentinel, biopsy, right external iliac - METASTATIC CARCINOMA PRESENT IN (1) OF (1) LYMPH NODE. 2. Lymph node, sentinel, biopsy, right deep obturator - ONE LYMPH NODE, NEGATIVE FOR CARCINOMA (0/1). 3. Lymph node, sentinel,   biopsy, left external iliac - ONE LYMPH NODE, NEGATIVE FOR CARCINOMA (0/1). 4. Uterus +/-  tubes/ovaries, neoplastic, cervix, bilateral fallopian tubes and ovaries - MIXED CELL CARCINOMA OF ENDOMETRIUM (MIXED ENDOMETRIOID CARCINOMA AND SEROUS CARCINOMA), WITH INVASION LESS THAN HALF OF THE MYOMETRIUM. - NO INVOLVEMENT OF UTERINE SEROSA, CERVICAL STROMA OR ADNEXA. - SEE ONCOLOGY TABLE. Microscopic Comment 4. UTERUS, CARCINOMA OR CARCINOSARCOMA Procedure: Total hysterectomy and bilateral salpingo-oophorectomy Histologic type: Mixed cell carcinoma (Endometrioid carcinoma 70% and Serous carcinoma 30%) Histologic Grade: Not applicable Myometrial Invasion: Present Depth of invasion: 6 mm Myometrial thickness: 17 mm Uterine Serosa Involvement: Not identified Cervical Stromal Involvement: Not identified Extent of Involvement of Other Organs: Not involved Lymphovascular Invasion: Present Regional Lymph Nodes: Examined: 3 Sentinel 0 Non-sentinel 3 Total Lymph nodes with metastases: 1 Isolated tumor cells (< 0.2 mm): 0 Micrometastasis (> 0.2 mm and < 2.0 mm): 0 Macrometastasis (> 2.0 mm): 1 Extracapsular extension: No definite ECE MMR/MSI Testing: Will be performed and the results reported separately Pathologic Stage Classification (pTNM, AJCC 8th edition): pT1a, pN1a FIGO Stage: III-C1 Representative Tumor Block: 1-A Comment: The size of the metastatic deposit in the right external iliac sentinel lymph node is 32 mm. The metastatic deposit is virtually all serous carcinoma. Intradepartmental consultation was obtained   10/14/2018 Surgery   Pre-operative Diagnosis: endometrial cancer grade 3 (serous), obesity  Operation: Robotic-assisted laparoscopic total hysterectomy with bilateral salpingoophorectomy, SLN,  biopsy (22 modifier for obesity, BMI 32).  Surgeon: Rossi, Emma Caroline  Operative Findings:  : 8cm arcuate uterus, normal appearing tubes and ovaries. Obesity with BMI 32. Narrow vaginal introitus.     10/17/2018 Cancer Staging   Staging form: Corpus Uteri -  Carcinoma and Carcinosarcoma, AJCC 8th Edition - Pathologic: FIGO Stage IIIC1 (pT1, pN1a, cM0) - Signed by , , MD on 10/17/2018    Genetic Testing   Patient has genetic testing done for MMR. Results revealed patient has the following mutation(s): MMR: Normal    Genetic Testing   Patient has genetic testing done for MSI on pathology from 10/14/18. Results revealed patient has the following: MSI Stable   11/06/2018 Procedure   Placement of single lumen port a cath via right internal jugular vein. The catheter tip lies at the cavo-atrial junction. A power injectable port a cath was placed and is ready for immediate use.   11/10/2018 -  Chemotherapy   The patient had carboplatin and taxol for chemotherapy treatment.     11/10/2018 -  Chemotherapy   The patient had carboplatin and taxol for chemotherapy treatment.       REVIEW OF SYSTEMS:   Constitutional: Denies fevers, chills or abnormal weight loss Eyes: Denies blurriness of vision Ears, nose, mouth, throat, and face: Denies mucositis or sore throat Respiratory: Denies cough, dyspnea or wheezes Cardiovascular: Denies palpitation, chest discomfort or lower extremity swelling Gastrointestinal:  Denies nausea, heartburn or change in bowel habits Lymphatics: Denies new lymphadenopathy or easy bruising Behavioral/Psych: Mood is stable, no new changes  All other systems were reviewed with the patient and are negative.  I have reviewed the past medical history, past surgical history, social history and family history with the patient and they are unchanged from previous note.  ALLERGIES:  is allergic to sulfa antibiotics.  MEDICATIONS:  Current Outpatient Medications  Medication Sig Dispense Refill  . acetaminophen (TYLENOL) 500 MG tablet Take 1,000 mg by mouth every 6 (six) hours as needed for moderate pain.    . gabapentin (NEURONTIN) 300 MG capsule Take 1 capsule (  300 mg total) by mouth 2 (two) times daily. 60 capsule 11  .  heparin lock flush 100 UNIT/ML SOLN injection Inject 2.5 mLs (250 Units total) into the vein every Monday, Wednesday, and Friday. 30 mL 0  . MELATONIN PO Take 1 tablet by mouth at bedtime as needed (sleep).    . nystatin (MYCOSTATIN/NYSTOP) powder Apply topically 2 (two) times daily. 30 g 0  . ondansetron (ZOFRAN) 4 MG tablet Take 1 tablet (4 mg total) by mouth every 6 (six) hours as needed for nausea. 20 tablet 0  . polyethylene glycol (MIRALAX / GLYCOLAX) 17 g packet Take 17 g by mouth daily as needed for mild constipation. 14 each 0   No current facility-administered medications for this visit.     PHYSICAL EXAMINATION: ECOG PERFORMANCE STATUS: 2 - Symptomatic, <50% confined to bed  Vitals:   03/26/19 0925  BP: (!) 145/80  Pulse: (!) 114  Resp: 18  Temp: 98.5 F (36.9 C)  SpO2: 98%   Filed Weights   03/26/19 0925  Weight: 175 lb (79.4 kg)    GENERAL:alert, no distress and comfortable SKIN: skin color, texture, turgor are normal, no rashes or significant lesions EYES: normal, Conjunctiva are pink and non-injected, sclera clear OROPHARYNX:no exudate, no erythema and lips, buccal mucosa, and tongue normal  NECK: supple, thyroid normal size, non-tender, without nodularity LYMPH:  no palpable lymphadenopathy in the cervical, axillary or inguinal LUNGS: clear to auscultation and percussion with normal breathing effort HEART: regular rate & rhythm and no murmurs and no lower extremity edema ABDOMEN:abdomen soft, non-tender and normal bowel sounds Musculoskeletal:no cyanosis of digits and no clubbing  NEURO: alert & oriented x 3 with fluent speech, no focal motor/sensory deficits  LABORATORY DATA:  I have reviewed the data as listed    Component Value Date/Time   NA 139 03/26/2019 0822   NA 141 07/05/2016 1642   K 4.4 03/26/2019 0822   CL 104 03/26/2019 0822   CO2 25 03/26/2019 0822   GLUCOSE 95 03/26/2019 0822   BUN 10 03/26/2019 0822   BUN 10 07/05/2016 1642    CREATININE 0.78 03/26/2019 0822   CREATININE 0.86 05/22/2012 1415   CALCIUM 9.3 03/26/2019 0822   PROT 7.1 03/26/2019 0822   ALBUMIN 4.0 03/26/2019 0822   AST 20 03/26/2019 0822   ALT 17 03/26/2019 0822   ALKPHOS 142 (H) 03/26/2019 0822   BILITOT 0.4 03/26/2019 0822   GFRNONAA >60 03/26/2019 0822   GFRAA >60 03/26/2019 0822    No results found for: SPEP, UPEP  Lab Results  Component Value Date   WBC 5.4 03/26/2019   NEUTROABS 2.9 03/26/2019   HGB 9.2 (L) 03/26/2019   HCT 27.5 (L) 03/26/2019   MCV 96.5 03/26/2019   PLT 217 03/26/2019      Chemistry      Component Value Date/Time   NA 139 03/26/2019 0822   NA 141 07/05/2016 1642   K 4.4 03/26/2019 0822   CL 104 03/26/2019 0822   CO2 25 03/26/2019 0822   BUN 10 03/26/2019 0822   BUN 10 07/05/2016 1642   CREATININE 0.78 03/26/2019 0822   CREATININE 0.86 05/22/2012 1415      Component Value Date/Time   CALCIUM 9.3 03/26/2019 0822   ALKPHOS 142 (H) 03/26/2019 0822   AST 20 03/26/2019 0822   ALT 17 03/26/2019 0822   BILITOT 0.4 03/26/2019 0822      All questions were answered. The patient knows to call the clinic with   any problems, questions or concerns. No barriers to learning was detected.  I spent 25 minutes counseling the patient face to face. The total time spent in the appointment was 30 minutes and more than 50% was on counseling and review of test results  Heath Lark, MD 03/26/2019 10:11 AM

## 2019-03-26 NOTE — Assessment & Plan Note (Signed)
She has chronic urinary incontinence since surgery She is also debilitated from neuropathy I recommend referral to physical therapy and rehab at the cancer rehab facility after treatment and she agreed

## 2019-03-26 NOTE — Patient Instructions (Signed)
PICC Home Care Guide ° °A peripherally inserted central catheter (PICC) is a form of IV access that allows medicines and IV fluids to be quickly distributed throughout the body. The PICC is a long, thin, flexible tube (catheter) that is inserted into a vein in the upper arm. The catheter ends in a large vein in the chest (superior vena cava, or SVC). After the PICC is inserted, a chest X-ray may be done to make sure that it is in the correct place. °A PICC may be placed for different reasons, such as: °· To give medicines and liquid nutrition. °· To give IV fluids and blood products. °· If there is trouble placing a peripheral intravenous (PIV) catheter. °If taken care of properly, a PICC can remain in place for several months. Having a PICC can also allow a person to go home from the hospital sooner. Medicine and PICC care can be managed at home by a family member, caregiver, or home health care team. °What are the risks? °Generally, having a PICC is safe. However, problems may occur, including: °· A blood clot (thrombus) forming in or at the tip of the PICC. °· A blood clot forming in a vein (deep vein thrombosis) or traveling to the lung (pulmonary embolism). °· Inflammation of the vein (phlebitis) in which the PICC is placed. °· Infection. Central line associated blood stream infection (CLABSI) is a serious infection that often requires hospitalization. °· PICC movement (malposition). The PICC tip may move from its original position due to excessive physical activity, forceful coughing, sneezing, or vomiting. °· A break or cut in the PICC. It is important not to use scissors near the PICC. °· Nerve or tendon irritation or injury during PICC insertion. °How to take care of your PICC °Preventing problems °· You and any caregivers should wash your hands often with soap. Wash hands: °? Before touching the PICC line or the infusion device. °? Before changing a bandage (dressing). °· Flush the PICC as told by your  health care provider. Let your health care provider know right away if the PICC is hard to flush or does not flush. Do not use force to flush the PICC. °· Do not use a syringe that is less than 10 mL to flush the PICC. °· Avoid blood pressure checks on the arm in which the PICC is placed. °· Never pull or tug on the PICC. °· Do not take the PICC out yourself. Only a trained clinical professional should remove the PICC. °· Use clean and sterile supplies only. Keep the supplies in a dry place. Do not reuse needles, syringes, or any other supplies. Doing that can lead to infection. °· Keep pets and children away from your PICC line. °· Check the PICC insertion site every day for signs of infection. Check for: °? Leakage. °? Redness, swelling, or pain. °? Fluid or blood. °? Warmth. °? Pus or a bad smell. °PICC dressing care °· Keep your PICC bandage (dressing) clean and dry to prevent infection. °· Do not take baths, swim, or use a hot tub until your health care provider approves. Ask your health care provider if you can take showers. You may only be allowed to take sponge baths for bathing. When you are allowed to shower: °? Ask your health care provider to teach you how to wrap the PICC line. °? Cover the PICC line with clear plastic wrap and tape to keep it dry while showering. °· Follow instructions from your health care provider   about how to take care of your insertion site and dressing. Make sure you: °? Wash your hands with soap and water before you change your bandage (dressing). If soap and water are not available, use hand sanitizer. °? Change your dressing as told by your health care provider. °? Leave stitches (sutures), skin glue, or adhesive strips in place. These skin closures may need to stay in place for 2 weeks or longer. If adhesive strip edges start to loosen and curl up, you may trim the loose edges. Do not remove adhesive strips completely unless your health care provider tells you to do  that. °· Change your PICC dressing if it becomes loose or wet. °General instructions ° °· Carry your PICC identification card or wear a medical alert bracelet at all times. °· Keep the tube clamped at all times, unless it is being used. °· Carry a smooth-edge clamp with you at all times to place on the tube if it breaks. °· Do not use scissors or sharp objects near the tube. °· You may bend your arm and move it freely. If your PICC is near or at the bend of your elbow, avoid activity with repeated motion at the elbow. °· Avoid lifting heavy objects as told by your health care provider. °· Keep all follow-up visits as told by your health care provider. This is important. °Disposal of supplies °· Throw away any syringes in a disposal container that is meant for sharp items (sharps container). You can buy a sharps container from a pharmacy, or you can make one by using an empty hard plastic bottle with a cover. °· Place any used dressings or infusion bags into a plastic bag. Throw that bag in the trash. °Contact a health care provider if: °· You have pain in your arm, ear, face, or teeth. °· You have a fever or chills. °· You have redness, swelling, or pain around the insertion site. °· You have fluid or blood coming from the insertion site. °· Your insertion site feels warm to the touch. °· You have pus or a bad smell coming from the insertion site. °· Your skin feels hard and raised around the insertion site. °Get help right away if: °· Your PICC is accidentally pulled all the way out. If this happens, cover the insertion site with a bandage or gauze dressing. Do not throw the PICC away. Your health care provider will need to check it. °· Your PICC was tugged or pulled and has partially come out. Do not  push the PICC back in. °· You cannot flush the PICC, it is hard to flush, or the PICC leaks around the insertion site when it is flushed. °· You hear a "flushing" sound when the PICC is flushed. °· You feel your  heart racing or skipping beats. °· There is a hole or tear in the PICC. °· You have swelling in the arm in which the PICC was inserted. °· You have a red streak going up your arm from where the PICC was inserted. °Summary °· A peripherally inserted central catheter (PICC) is a long, thin, flexible tube (catheter) that is inserted into a vein in the upper arm. °· The PICC is inserted using a sterile technique by a specially trained nurse or physician. Only a trained clinical professional should remove it. °· Keep your PICC identification card with you at all times. °· Avoid blood pressure checks on the arm in which the PICC is placed. °· If cared for   properly, a PICC can remain in place for several months. Having a PICC can also allow a person to go home from the hospital sooner. °This information is not intended to replace advice given to you by your health care provider. Make sure you discuss any questions you have with your health care provider. °Document Released: 11/25/2002 Document Revised: 05/03/2017 Document Reviewed: 06/23/2016 °Elsevier Patient Education © 2020 [employer] ° °

## 2019-03-27 ENCOUNTER — Telehealth: Payer: Self-pay | Admitting: Hematology and Oncology

## 2019-03-27 NOTE — Telephone Encounter (Signed)
Scheduled appt per 10/22 sch message - unable to reach pt - vmail full and unable to leave message - mailed reminder letter with appt date and time

## 2019-04-06 ENCOUNTER — Ambulatory Visit
Admission: RE | Admit: 2019-04-06 | Discharge: 2019-04-06 | Disposition: A | Discharge status: Home / Self Care | Payer: PPO | Source: Ambulatory Visit | Attending: Radiation Oncology | Admitting: Radiation Oncology

## 2019-04-06 ENCOUNTER — Other Ambulatory Visit: Payer: Self-pay

## 2019-04-06 ENCOUNTER — Encounter: Payer: Self-pay | Admitting: Radiation Oncology

## 2019-04-06 VITALS — BP 127/74 | HR 108 | Temp 98.5°F | Resp 18 | Wt 172.2 lb

## 2019-04-06 DIAGNOSIS — C541 Malignant neoplasm of endometrium: Secondary | ICD-10-CM | POA: Diagnosis not present

## 2019-04-06 DIAGNOSIS — R5383 Other fatigue: Secondary | ICD-10-CM | POA: Diagnosis not present

## 2019-04-06 DIAGNOSIS — Z79899 Other long term (current) drug therapy: Secondary | ICD-10-CM | POA: Insufficient documentation

## 2019-04-06 DIAGNOSIS — Z923 Personal history of irradiation: Secondary | ICD-10-CM | POA: Insufficient documentation

## 2019-04-06 DIAGNOSIS — Z08 Encounter for follow-up examination after completed treatment for malignant neoplasm: Secondary | ICD-10-CM | POA: Diagnosis not present

## 2019-04-06 NOTE — Progress Notes (Signed)
Patient in for one month follow up doing well. Dilator teaching complete. Questions and concerns addressed.

## 2019-04-06 NOTE — Progress Notes (Signed)
Radiation Oncology         [phone]) [phone] ________________________________  Name: Zoe Kim MRN: [id number]  Date: 04/06/2019  DOB: 10/31/1945  Follow-Up Visit Note  CC: Lujean Amel, MD  Everitt Amber, MD    ICD-10-CM   1. 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  01/15/2019 through 03/04/2019 Site Technique Total Dose (Gy) Dose per Fx (Gy) Completed Fx Beam Energies  Pelvis: Vaginal Cylinder (2.5 cm) HDR, Brachii Therapy (3.0 cm length) 30/30 6 5/5 Iridium-192     Narrative:  The patient returns today for routine follow-up.  Since last visit, patient was seen by Dr. Megan Salon, infectious disease specialist, and was treated for coagulase negative Staphylococcus bacteremia. This was resolved with antibiotic therapy and the removal of Port-A-Cath. She followed up with Dr. Alvy Bimler on 03/26/2019 for sixth cycle of antineoplastic chemotherapy. CT of abdomen/pelvis is scheduled for 04/27/2019.  She denies any vaginal bleeding or pelvic pain.  She does have some fatigue.  She denies any urinary symptoms or bowel complaints at this time.                  ALLERGIES:  is allergic to sulfa antibiotics.  Meds: Current Outpatient Medications  Medication Sig Dispense Refill  . acetaminophen (TYLENOL) 500 MG tablet Take 1,000 mg by mouth every 6 (six) hours as needed for moderate pain.    Marland Kitchen gabapentin (NEURONTIN) 300 MG capsule Take 1 capsule (300 mg total) by mouth 2 (two) times daily. 60 capsule 11  . heparin lock flush 100 UNIT/ML SOLN injection Inject 2.5 mLs (250 Units total) into the vein every Monday, Wednesday, and Friday. 30 mL 0  . MELATONIN PO Take 1 tablet by mouth at bedtime as needed (sleep).    . mirtazapine (REMERON) 15 MG tablet     . nystatin  (MYCOSTATIN/NYSTOP) powder Apply topically 2 (two) times daily. 30 g 0  . ondansetron (ZOFRAN) 4 MG tablet Take 1 tablet (4 mg total) by mouth every 6 (six) hours as needed for nausea. 20 tablet 0  . polyethylene glycol (MIRALAX / GLYCOLAX) 17 g packet Take 17 g by mouth daily as needed for mild constipation. 14 each 0   No current facility-administered medications for this encounter.     Physical Findings: The patient is in no acute distress. Patient is alert and oriented.  weight is 172 lb 3.2 oz (78.1 kg). Her temporal temperature is 98.5 F (36.9 C). Her blood pressure is 127/74 and her pulse is 108 (abnormal). Her respiration is 18 and oxygen saturation is 100%. .  No significant changes. Lungs are clear to auscultation bilaterally. Heart has regular rate and rhythm. No palpable cervical, supraclavicular, or axillary adenopathy. Abdomen soft, non-tender, normal bowel sounds.  Pelvic exam deferred in light of recent completion of treatment.   Lab Findings: Lab Results  Component Value Date   WBC 5.4 03/26/2019   HGB 9.2 (L) 03/26/2019   HCT 27.5 (L) 03/26/2019   MCV 96.5 03/26/2019   PLT 217 03/26/2019    Radiographic Findings: No results found.  Impression:  The patient is recovering from the effects of radiation.  She reports no side effects from her radiation treatment.  She is happy to be completing her adjuvant chemotherapy.  Plan: Routine follow-up in radiation oncology  in 5 months.  Patient will follow up with Dr. Denman George in approximately 2 months.  Today the patient was given a vaginal dilator and instructions on its use.  ____________________________________ Gery Pray, MD   This document serves as a record of services personally performed by Gery Pray, MD. It was created on his behalf by Clerance Lav, a trained medical scribe. The creation of this record is based on the scribe's personal observations and the provider's statements to them. This document has been  checked and approved by the attending provider.

## 2019-04-06 NOTE — Patient Instructions (Signed)
Coronavirus (COVID-19) Are you at risk?  Are you at risk for the Coronavirus (COVID-19)?  To be considered HIGH RISK for Coronavirus (COVID-19), you have to meet the following criteria:  . Traveled to [place], [place], [place], [place] or [place]; or in the [place] to [place], [place], [place], or [place]; and have fever, cough, and shortness of breath within the last 2 weeks of travel OR . Been in close contact with a person diagnosed with COVID-19 within the last 2 weeks and have fever, cough, and shortness of breath . IF YOU DO NOT MEET THESE CRITERIA, YOU ARE CONSIDERED LOW RISK FOR COVID-19.  What to do if you are HIGH RISK for COVID-19?  . If you are having a medical emergency, call 911. . Seek medical care right away. Before you go to a doctor's office, urgent care or emergency department, call ahead and tell them about your recent travel, contact with someone diagnosed with COVID-19, and your symptoms. You should receive instructions from your physician's office regarding next steps of care.  . When you arrive at healthcare provider, tell the healthcare staff immediately you have returned from visiting [place], [place], [place], [place] or [place]; or traveled in the [place] to [place], [place], [place], or [place]; in the last two weeks or you have been in close contact with a person diagnosed with COVID-19 in the last 2 weeks.   . Tell the health care staff about your symptoms: fever, cough and shortness of breath. . After you have been seen by a medical provider, you will be either: o Tested for (COVID-19) and discharged home on quarantine except to seek medical care if symptoms worsen, and asked to  - Stay home and avoid contact with others until you get your results (4-5 days)  - Avoid travel on public transportation if possible (such as bus, train, or airplane) or o Sent to the Emergency Department by EMS for evaluation, COVID-19 testing, and possible  admission depending on your condition and test results.  What to do if you are LOW RISK for COVID-19?  Reduce your risk of any infection by using the same precautions used for avoiding the common cold or flu:  . Wash your hands often with soap and warm water for at least 20 seconds.  If soap and water are not readily available, use an alcohol-based hand sanitizer with at least 60% alcohol.  . If coughing or sneezing, cover your mouth and nose by coughing or sneezing into the elbow areas of your shirt or coat, into a tissue or into your sleeve (not your hands). . Avoid shaking hands with others and consider head nods or verbal greetings only. . Avoid touching your eyes, nose, or mouth with unwashed hands.  . Avoid close contact with people who are sick. . Avoid places or events with large numbers of people in one location, like concerts or sporting events. . Carefully consider travel plans you have or are making. . If you are planning any travel outside or inside the [place], visit the CDC's Travelers' Health webpage for the latest health notices. . If you have some symptoms but not all symptoms, continue to monitor at home and seek medical attention if your symptoms worsen. . If you are having a medical emergency, call 911.   ADDITIONAL HEALTHCARE OPTIONS FOR PATIENTS   Telehealth / e-Visit: [email or web address]         [facility]: [phone]     Urgent Care: [phone]                   [facility]: [phone]   

## 2019-04-06 NOTE — Progress Notes (Incomplete)
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 She was not noted to have experienced any acute side effects.  Plan: The patient will follow-up with radiation oncology in 1 month.  ________________________________________________ ***

## 2019-04-07 ENCOUNTER — Telehealth: Payer: Self-pay | Admitting: *Deleted

## 2019-04-07 NOTE — Telephone Encounter (Signed)
CALLED PATIENT TO INFORM OF FU WITH DR. KINARD ON 09-07-19 @ 3:30 PM AND ALSO TO LET HER KNOW THAT SHE WILL BE HAVING A FU WITH DR. Denman George IN TWO MONTHS, BUT THEIR BOOKS AREN'T OPEN YET, I WILL CALL HER AS SOON AS THEY CALL ME.

## 2019-04-10 ENCOUNTER — Telehealth: Payer: Self-pay | Admitting: *Deleted

## 2019-04-10 NOTE — Telephone Encounter (Signed)
Attempted to leave the patient a message to schedule an appt in Jan. Unable to leave message, voicemail full

## 2019-04-13 ENCOUNTER — Encounter: Payer: Self-pay | Admitting: Physical Therapy

## 2019-04-13 ENCOUNTER — Other Ambulatory Visit: Payer: Self-pay

## 2019-04-13 ENCOUNTER — Ambulatory Visit: Payer: PPO | Attending: Hematology and Oncology | Admitting: Physical Therapy

## 2019-04-13 DIAGNOSIS — R262 Difficulty in walking, not elsewhere classified: Secondary | ICD-10-CM | POA: Insufficient documentation

## 2019-04-13 DIAGNOSIS — M6281 Muscle weakness (generalized): Secondary | ICD-10-CM | POA: Diagnosis not present

## 2019-04-13 DIAGNOSIS — R252 Cramp and spasm: Secondary | ICD-10-CM

## 2019-04-13 NOTE — Patient Instructions (Signed)
Relaxation Exercises with the Urge to Void   When you experience an urge to void:  FIRST  Stop and stand very still    Sit down if you can    Don't move    You need to stay very still to maintain control  SECOND Squeeze your pelvic floor muscles 5 times, like a quick flick, to keep from leaking  THIRD Relax  Take a deep breath and then let it out  Try to make the urge go away by using relaxation and visualization techniques  FINALLY When you feel the urge go away somewhat, walk normally to the bathroom.   If the urge gets suddenly stronger on the way, you may stop again and relax to regain control.   Access Code: [email or web address]  URL: [email or web address]  Date: 04/13/2019  Prepared by: Jari Favre   Exercises  Sit to Stand with Pelvic Floor Contraction - 5 reps - 1 sets - 3x daily - 7x weekly

## 2019-04-14 NOTE — Therapy (Signed)
[facility] [address]  [place], [place], [address] Phone: [phone]   Fax:  [phone]  Physical Therapy Evaluation  Patient Details  Name: Zoe Kim MRN: [id number] Date of Birth: 1945-09-30 Referring Provider (PT): Heath Lark, MD   Encounter Date: 04/13/2019  PT End of Session - 04/13/19 1151    Visit Number  1    Date for PT Re-Evaluation  06/08/19    PT Start Time  1148    PT Stop Time  1228    PT Time Calculation (min)  40 min    Activity Tolerance  Patient tolerated treatment well       Past Medical History:  Diagnosis Date  . Anxiety   . Arthritis   . Depression   . Endometrial ca ([place])   . Family history of breast cancer   . Family history of lung cancer   . Heart murmur    "years ago"  . Insomnia   . Pneumonia    "long time ago"  . Primary localized osteoarthrosis of the knee, right   . Urinary urgency     Past Surgical History:  Procedure Laterality Date  . COLONOSCOPY    . EYE SURGERY Bilateral    cataract with lens  . IR IMAGING GUIDED PORT INSERTION  11/06/2018  . IR REMOVAL TUN ACCESS W/ PORT W/O FL MOD SED  02/13/2019  . ROBOTIC ASSISTED TOTAL HYSTERECTOMY WITH BILATERAL SALPINGO OOPHERECTOMY N/A 10/14/2018   Procedure: ROBOTIC ASSISTED TOTAL HYSTERECTOMY WITH BILATERAL SALPINGO OOPHORECTOMY;  Surgeon: Everitt Amber, MD;  Location: [facility];  Service: Gynecology;  Laterality: N/A;  . SENTINEL NODE BIOPSY N/A 10/14/2018   Procedure: SENTINEL LYMPH NODE BIOPSY;  Surgeon: Everitt Amber, MD;  Location: [facility];  Service: Gynecology;  Laterality: N/A;  . TONSILLECTOMY    . TOOTH EXTRACTION Right 08/27/2017  . TOTAL KNEE ARTHROPLASTY Right 07/30/2016   Procedure: TOTAL KNEE ARTHROPLASTY;  Surgeon: Elsie Saas, MD;  Location: [facility];  Service: Orthopedics;  Laterality: Right;  . VEIN LIGATION AND STRIPPING      There were no vitals filed for this visit.   Subjective Assessment -  04/13/19 1152    Subjective  Pt states she is using 6-8 diapers per day.  States she now had to go immediatey.  She is having neuropathy that painful and numbness. Can hold bladder longer when sitting (2 hours), one hour when standing    Limitations  Walking    How long can you walk comfortably?  100 ft    Patient Stated Goals  leakage, be able to walk up the hill    Currently in Pain?  No/denies         [facility] PT Assessment - 04/14/19 0001      Assessment   Medical Diagnosis  C54.1 (ICD-10-CM) - Endometrial cancer ([place]); G62.0,T45.1X5A (ICD-10-CM) - Peripheral neuropathy due to chemotherapy ([place]); R32 (ICD-10-CM) - Urinary incontinence, unspecified type    Referring Provider (PT)  Heath Lark, MD    Prior Therapy  No      Precautions   Precautions  None      Restrictions   Weight Bearing Restrictions  No      Balance Screen   Has the patient fallen in the past 6 months  No   losing balance more often     [place] residence    Living Arrangements  Spouse/significant other  Prior Function   Level of Independence  Independent    Vocation  --   dance teacher     Cognition   Overall Cognitive Status  Within Functional Limits for tasks assessed      ROM / Strength   AROM / PROM / Strength  Strength      Strength   Overall Strength Comments  hip flexion 4/5 bilateral; Lt hip abduction 4/5      Flexibility   Soft Tissue Assessment /Muscle Length  --   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    Pelvic Floor Special Questions - 04/14/19 0001    Prior Pelvic/Prostate Exam  Yes    Prior Pregnancies  Yes   not to full term   Currently Sexually Active  No   not yet - husband also undergone cancer Tx recently   Urinary Leakage  Yes    How often  6-8x per day    Activities that cause leaking  With strong urge    Fecal incontinence  No   every other day BM   Fluid intake  3-4 bottles of water; a couple sprites    Skin Integrity  --   vaginal dryness   Pelvic Floor Internal Exam  pt identity confirmed and informed consent given to perform intenal soft tissue assessment    Exam Type  Vaginal    Sensation  normal    Palpation  more atrophy on Lt side    Strength  weak squeeze, no lift    Strength # of reps  1    Strength # of seconds  4       [facility] Adult PT Treatment/Exercise - 04/14/19 0001      Self-Care   Self-Care  Other Self-Care Comments    Other Self-Care Comments   intial HEP and urge tecchniques             PT Education - 04/13/19 1215    Education Details  Access Code: [email or web address]    Person(s) Educated  Patient    Methods  Explanation;Demonstration;Handout;Verbal cues    Comprehension  Verbalized understanding;Returned demonstration       PT Short Term Goals - 04/14/19 0832      PT SHORT TERM GOAL #1   Title  ind with initial HEP    Time  4    Period  Weeks    Status  New    Target Date  05/11/19      PT SHORT TERM GOAL #2   Title  Pt will report she is able to walk up to 300 ft without stopping to rest    Baseline  100 ft at the most    Time  4    Period  Weeks    Status  New    Target Date  05/11/19      PT SHORT TERM GOAL #3   Title  Pt will report using 3 diapers/ day due to decreased leakage    Baseline  6/day    Time  4    Period  Weeks    Status  New    Target Date  05/11/19      PT SHORT TERM GOAL #4    Title  Pt will be able to perfrom 6 min walk test due to improved endurance    Time  4    Period  Weeks    Status  New    Target Date  05/11/19        PT Long Term Goals - 04/14/19 0836      PT LONG TERM GOAL #1   Title  Pt will be ind with advanced HEP    Time  8    Period  Weeks    Status  New    Target Date  06/08/19      PT LONG TERM GOAL #2   Title  pt will report leakage 1x/day at most and able to hold bladder for up to 2 hours when standing/walking    Baseline  6x and less than one hour when standing/walking    Time  8    Period  Weeks    Status  New    Target Date  06/08/19      PT LONG TERM GOAL #3   Title  Pt will be able to perform single leg stand x10 sec bilaterally for reduced risk of falls    Time  8    Period  Weeks    Status  New    Target Date  06/08/19      PT LONG TERM GOAL #4   Title  pt will be able to perform 5x sit to stand in <13 sec for reduced risk of falls and improved strength    Time  8    Period  Weeks    Status  New    Target Date  06/08/19             Plan - 04/14/19 [id number]    Clinical Impression Statement  Pt presents to skilled PT today due to urinary incontinence and deconditioning status post cancer treatments of endometrial cancer.  Pt has weakness of Lt hip.  She has pelvic floor weakness of 2/5 due to weak squeeze and weak lift Lt more weak than Rt.  Pt has low endurance of 4 second hold.  Pt has increase fascial restriction of pelvic wall Lt>Rt.  Pt has vaginal dryness.  She has decreased overall strength and increased risk of falls demonstrated by 21 sec for 5 x sit to stand.  Pt demonstrate single leg stand of 2 sec bilateral.  Based on patients prior level of activity, she has ability to return to much higher level of function and is motivated to do so.  Pt is a Gaffer and cares for horses.  She lives in an area where she has to walk on a lot of uneven surfaces in order to fully participate in her community.  Pt will  benefit from skilled PT in order to return to maximum function, reduce risk of falls, and regain bladder control.    Personal Factors and Comorbidities  Age;Comorbidity 3+    Comorbidities  hysterectomy, chemo, radiation, husband recently had cancer  Examination-Activity Limitations  Toileting;Continence;Locomotion Level    Examination-Participation Restrictions  Community Activity;Interpersonal Relationship    Stability/Clinical Decision Making  Evolving/Moderate complexity    Clinical Decision Making  Moderate    Rehab Potential  Excellent    PT Frequency  2x / week    PT Duration  8 weeks    PT Treatment/Interventions  ADLs/Self Care Home Management;Biofeedback;Cryotherapy;Electrical Stimulation;Moist Heat;Therapeutic activities;Therapeutic exercise;Manual techniques;Taping;Dry needling;Passive range of motion;Neuromuscular re-education    PT Next Visit Plan  dilator usage, biofeedback or internal tactile cues to strengthen pelvic floor    PT Home Exercise Plan  initiate next    Consulted and Agree with Plan of Care  Patient       Patient will benefit from skilled therapeutic intervention in order to improve the following deficits and impairments:  Abnormal gait, Pain, Increased fascial restricitons, Decreased strength, Difficulty walking, Decreased activity tolerance, Increased muscle spasms, Impaired sensation  Visit Diagnosis: Muscle weakness (generalized)  Difficulty in walking, not elsewhere classified  Cramp and spasm     Problem List Patient Active Problem List   Diagnosis Date Noted  . Yeast infection 03/19/2019  . Urinary incontinence 02/13/2019  . Coagulase negative Staphylococcus bacteremia 02/12/2019  . SIRS (systemic inflammatory response syndrome) ([place]) 02/11/2019  . Tachycardia 01/13/2019  . Neutropenic fever ([place]) 12/30/2018  . Anemia due to antineoplastic chemotherapy 12/24/2018  . Mild depression ([place]) 12/03/2018  . Other constipation 11/20/2018  .  Peripheral neuropathy due to chemotherapy ([place]) 11/20/2018  . Family history of breast cancer   . Family history of lung cancer   . Bone pain 11/17/2018  . Obesity 10/14/2018  . Endometrial cancer ([place]) 10/01/2018  . Primary localized osteoarthritis of right knee 07/30/2016  . Anxiety   . Primary localized osteoarthrosis of the knee, right   . Urinary urgency     Jule Ser, PT 04/14/2019, 8:53 AM  [facility] [address] [place], [place], [address] Phone: [phone]   Fax:  [phone]  Name: Zoe Kim MRN: [id number] Date of Birth: 09/27/1945

## 2019-04-16 ENCOUNTER — Telehealth: Payer: Self-pay | Admitting: *Deleted

## 2019-04-16 NOTE — Telephone Encounter (Signed)
Called the patient and scheduled an appt for Jan

## 2019-04-21 ENCOUNTER — Telehealth: Payer: Self-pay | Admitting: Physical Therapy

## 2019-04-21 ENCOUNTER — Ambulatory Visit: Payer: PPO | Admitting: Physical Therapy

## 2019-04-21 NOTE — Telephone Encounter (Signed)
Pt called due to no show.  Mailbox was full and could not accept messages.  Gustavus Bryant, PT 04/21/19 8:19 AM

## 2019-04-23 ENCOUNTER — Ambulatory Visit: Payer: PPO | Admitting: Physical Therapy

## 2019-04-23 ENCOUNTER — Encounter: Payer: Self-pay | Admitting: Physical Therapy

## 2019-04-23 ENCOUNTER — Other Ambulatory Visit: Payer: Self-pay

## 2019-04-23 DIAGNOSIS — R252 Cramp and spasm: Secondary | ICD-10-CM

## 2019-04-23 DIAGNOSIS — M6281 Muscle weakness (generalized): Secondary | ICD-10-CM

## 2019-04-23 DIAGNOSIS — R262 Difficulty in walking, not elsewhere classified: Secondary | ICD-10-CM

## 2019-04-23 NOTE — Therapy (Signed)
[facility] [address] [place], [place], [address] Phone: [phone]   Fax:  [phone]  Physical Therapy Treatment  Patient Details  Name: Zoe Kim MRN: [id number] Date of Birth: 10-05-1945 Referring Provider (PT): Heath Lark, MD   Encounter Date: 04/23/2019  PT End of Session - 04/23/19 1450    Visit Number  2    Date for PT Re-Evaluation  06/08/19    PT Start Time  [id number]    PT Stop Time  1528    PT Time Calculation (min)  41 min    Activity Tolerance  Patient tolerated treatment well    Behavior During Therapy  [facility] for tasks assessed/performed       Past Medical History:  Diagnosis Date  . Anxiety   . Arthritis   . Depression   . Endometrial ca ([place])   . Family history of breast cancer   . Family history of lung cancer   . Heart murmur    "years ago"  . Insomnia   . Pneumonia    "long time ago"  . Primary localized osteoarthrosis of the knee, right   . Urinary urgency     Past Surgical History:  Procedure Laterality Date  . COLONOSCOPY    . EYE SURGERY Bilateral    cataract with lens  . IR IMAGING GUIDED PORT INSERTION  11/06/2018  . IR REMOVAL TUN ACCESS W/ PORT W/O FL MOD SED  02/13/2019  . ROBOTIC ASSISTED TOTAL HYSTERECTOMY WITH BILATERAL SALPINGO OOPHERECTOMY N/A 10/14/2018   Procedure: ROBOTIC ASSISTED TOTAL HYSTERECTOMY WITH BILATERAL SALPINGO OOPHORECTOMY;  Surgeon: Everitt Amber, MD;  [facility]: [facility];  Service: Gynecology;  Laterality: N/A;  . SENTINEL NODE BIOPSY N/A 10/14/2018   Procedure: SENTINEL LYMPH NODE BIOPSY;  Surgeon: Everitt Amber, MD;  [facility];  Service: Gynecology;  Laterality: N/A;  . TONSILLECTOMY    . TOOTH EXTRACTION Right 08/27/2017  . TOTAL KNEE ARTHROPLASTY Right 07/30/2016   Procedure: TOTAL KNEE ARTHROPLASTY;  Surgeon: Elsie Saas, MD;  Location: [facility];  Service: Orthopedics;  Laterality: Right;  . VEIN LIGATION AND STRIPPING      There were  no vitals filed for this visit.  Subjective Assessment - 04/23/19 1451    Subjective  Pt cannot walk back up from the barn. She has to do about 20 minutes of    Limitations  Walking    Patient Stated Goals  leakage, be able to walk up the hill    Currently in Pain?  No/denies                    Pelvic Floor Special Questions - 04/23/19 0001    Pelvic Floor Internal Exam  pt identity confirmed and informed consent given to perform intenal soft tissue assessment and treatment as mentioned in nuero re-ed        [facility] Adult PT Treatment/Exercise - 04/23/19 0001      Neuro Re-ed    Neuro Re-ed Details   tactile cues for iimproved circular contraction; biofeedback to work on contract and relax of pelvic floor      Exercises   Exercises  Lumbar      Lumbar Exercises: Stretches   Single Knee to Chest Stretch  Right;Left;20 seconds    Figure 4 Stretch  20 seconds;With overpressure;2 reps    Other Lumbar Stretch Exercise  butterfly stretch - 30 sec    Other Lumbar Stretch Exercise  breathing with stretching for  relaxing pelvic floor - does improve resting tone back down <64mV      Lumbar Exercises: Seated   Other Seated Lumbar Exercises  kegel with and without ball squeeze      Lumbar Exercises: Supine   AB Set Limitations  hooklying kegel with pillow under pelvis               PT Short Term Goals - 04/14/19 0832      PT SHORT TERM GOAL #1   Title  ind with initial HEP    Time  4    Period  Weeks    Status  New    Target Date  05/11/19      PT SHORT TERM GOAL #2   Title  Pt will report she is able to walk up to 300 ft without stopping to rest    Baseline  100 ft at the most    Time  4    Period  Weeks    Status  New    Target Date  05/11/19      PT SHORT TERM GOAL #3   Title  Pt will report using 3 diapers/ day due to decreased leakage    Baseline  6/day    Time  4    Period  Weeks    Status  New    Target Date  05/11/19      PT SHORT TERM GOAL  #4   Title  Pt will be able to perfrom 6 min walk test due to improved endurance    Time  4    Period  Weeks    Status  New    Target Date  05/11/19        PT Long Term Goals - 04/14/19 0836      PT LONG TERM GOAL #1   Title  Pt will be ind with advanced HEP    Time  8    Period  Weeks    Status  New    Target Date  06/08/19      PT LONG TERM GOAL #2   Title  pt will report leakage 1x/day at most and able to hold bladder for up to 2 hours when standing/walking    Baseline  6x and less than one hour when standing/walking    Time  8    Period  Weeks    Status  New    Target Date  06/08/19      PT LONG TERM GOAL #3   Title  Pt will be able to perform single leg stand x10 sec bilaterally for reduced risk of falls    Time  8    Period  Weeks    Status  New    Target Date  06/08/19      PT LONG TERM GOAL #4   Title  pt will be able to perform 5x sit to stand in <13 sec for reduced risk of falls and improved strength    Time  8    Period  Weeks    Status  New    Target Date  06/08/19            Plan - 04/23/19 1621    Clinical Impression Statement  Pt responded well to stroking muscles for improved circular contraction.  Pt also benefitted from biofeedback today.  She has a hard time relaxing afterdoing exercises but resting tone decreased in sitting and lying flat.  LE are weak and  she has trouble staying in hooklying for long.  Pt was not able to impliment her intial HEP yet because it was too difficult.  She was given a different set of exercises that were easier.    PT Treatment/Interventions  ADLs/Self Care Home Management;Biofeedback;Cryotherapy;Electrical Stimulation;Moist Heat;Therapeutic activities;Therapeutic exercise;Manual techniques;Taping;Dry needling;Passive range of motion;Neuromuscular re-education    PT Next Visit Plan  start with nustep for endurance, biofeedback and internal for improved circular contraction, exhale on exertion, basic core and LE  strengthening    PT Home Exercise Plan  Access Code: [email or web address]    Consulted and Agree with Plan of Care  Patient       Patient will benefit from skilled therapeutic intervention in order to improve the following deficits and impairments:  Abnormal gait, Pain, Increased fascial restricitons, Decreased strength, Difficulty walking, Decreased activity tolerance, Increased muscle spasms, Impaired sensation  Visit Diagnosis: Muscle weakness (generalized)  Difficulty in walking, not elsewhere classified  Cramp and spasm     Problem List Patient Active Problem List   Diagnosis Date Noted  . Yeast infection 03/19/2019  . Urinary incontinence 02/13/2019  . Coagulase negative Staphylococcus bacteremia 02/12/2019  . SIRS (systemic inflammatory response syndrome) ([place]) 02/11/2019  . Tachycardia 01/13/2019  . Neutropenic fever ([place]) 12/30/2018  . Anemia due to antineoplastic chemotherapy 12/24/2018  . Mild depression ([place]) 12/03/2018  . Other constipation 11/20/2018  . Peripheral neuropathy due to chemotherapy ([place]) 11/20/2018  . Family history of breast cancer   . Family history of lung cancer   . Bone pain 11/17/2018  . Obesity 10/14/2018  . Endometrial cancer ([place]) 10/01/2018  . Primary localized osteoarthritis of right knee 07/30/2016  . Anxiety   . Primary localized osteoarthrosis of the knee, right   . Urinary urgency     Jule Ser, PT 04/23/2019, 4:42 PM  [facility] [address] [place], [place], [address] Phone: [phone]   Fax:  [phone]  Name: Zoe Kim MRN: [id number] Date of Birth: Oct 25, 1945

## 2019-04-24 ENCOUNTER — Telehealth: Payer: Self-pay | Admitting: Hematology and Oncology

## 2019-04-24 NOTE — Telephone Encounter (Signed)
Returned patient's phone call regarding rescheduling 11/24 appointment, informed patient she will receive a call back once I get further assistance from the provider.

## 2019-04-27 ENCOUNTER — Ambulatory Visit (HOSPITAL_COMMUNITY)
Admission: RE | Admit: 2019-04-27 | Discharge: 2019-04-27 | Disposition: A | Discharge status: Home / Self Care | Payer: PPO | Source: Ambulatory Visit | Attending: Hematology and Oncology | Admitting: Hematology and Oncology

## 2019-04-27 ENCOUNTER — Inpatient Hospital Stay: Payer: PPO | Attending: Hematology and Oncology

## 2019-04-27 ENCOUNTER — Encounter (HOSPITAL_COMMUNITY): Payer: Self-pay

## 2019-04-27 ENCOUNTER — Other Ambulatory Visit: Payer: Self-pay

## 2019-04-27 DIAGNOSIS — G620 Drug-induced polyneuropathy: Secondary | ICD-10-CM | POA: Insufficient documentation

## 2019-04-27 DIAGNOSIS — Z9181 History of falling: Secondary | ICD-10-CM | POA: Insufficient documentation

## 2019-04-27 DIAGNOSIS — Z9221 Personal history of antineoplastic chemotherapy: Secondary | ICD-10-CM | POA: Insufficient documentation

## 2019-04-27 DIAGNOSIS — I700 Atherosclerosis of aorta: Secondary | ICD-10-CM | POA: Insufficient documentation

## 2019-04-27 DIAGNOSIS — D6481 Anemia due to antineoplastic chemotherapy: Secondary | ICD-10-CM | POA: Diagnosis not present

## 2019-04-27 DIAGNOSIS — T451X5A Adverse effect of antineoplastic and immunosuppressive drugs, initial encounter: Secondary | ICD-10-CM | POA: Insufficient documentation

## 2019-04-27 DIAGNOSIS — C541 Malignant neoplasm of endometrium: Secondary | ICD-10-CM | POA: Insufficient documentation

## 2019-04-27 LAB — CMP (CANCER CENTER ONLY)
ALT: 18 U/L (ref 0–44)
AST: 22 U/L (ref 15–41)
Albumin: 4.2 g/dL (ref 3.5–5.0)
Alkaline Phosphatase: 134 U/L — ABNORMAL HIGH (ref 38–126)
Anion gap: 12 (ref 5–15)
BUN: 7 mg/dL — ABNORMAL LOW (ref 8–23)
CO2: 25 mmol/L (ref 22–32)
Calcium: 9.5 mg/dL (ref 8.9–10.3)
Chloride: 101 mmol/L (ref 98–111)
Creatinine: 0.86 mg/dL (ref 0.44–1.00)
GFR, Est AFR Am: >60 mL/min (ref >60)
GFR, Estimated: >60 mL/min (ref >60)
Glucose, Bld: 99 mg/dL (ref 70–99)
Potassium: 4.2 mmol/L (ref 3.5–5.1)
Sodium: 138 mmol/L (ref 135–145)
Total Bilirubin: 0.6 mg/dL (ref 0.3–1.2)
Total Protein: 7 g/dL (ref 6.5–8.1)

## 2019-04-27 LAB — CBC WITH DIFFERENTIAL (CANCER CENTER ONLY)
Abs Immature Granulocytes: 0.03 10*3/uL (ref 0.00–0.07)
Basophils Absolute: 0 10*3/uL (ref 0.0–0.1)
Basophils Relative: 1 %
Eosinophils Absolute: 0.1 10*3/uL (ref 0.0–0.5)
Eosinophils Relative: 2 %
HCT: 29.7 % — ABNORMAL LOW (ref 36.0–46.0)
Hemoglobin: 10 g/dL — ABNORMAL LOW (ref 12.0–15.0)
Immature Granulocytes: 1 %
Lymphocytes Relative: 25 %
Lymphs Abs: 1.3 10*3/uL (ref 0.7–4.0)
MCH: 32.6 pg (ref 26.0–34.0)
MCHC: 33.7 g/dL (ref 30.0–36.0)
MCV: 96.7 fL (ref 80.0–100.0)
Monocytes Absolute: 0.6 10*3/uL (ref 0.1–1.0)
Monocytes Relative: 12 %
Neutro Abs: 3.1 10*3/uL (ref 1.7–7.7)
Neutrophils Relative %: 59 %
Platelet Count: 212 10*3/uL (ref 150–400)
RBC: 3.07 MIL/uL — ABNORMAL LOW (ref 3.87–5.11)
RDW: 14.1 % (ref 11.5–15.5)
WBC Count: 5.2 10*3/uL (ref 4.0–10.5)
nRBC: 0 % (ref 0.0–0.2)

## 2019-04-27 MED ORDER — IOHEXOL 300 MG/ML  SOLN: 75.0000 mL | Freq: Once | INTRAMUSCULAR | Status: DC | PRN

## 2019-04-27 MED ORDER — IOHEXOL 300 MG/ML  SOLN
100.0000 mL | Freq: Once | INTRAMUSCULAR | Status: AC | PRN
  Administered 2019-04-27: 100 mL via INTRAVENOUS

## 2019-04-27 MED ORDER — SODIUM CHLORIDE (PF) 0.9 % IJ SOLN
INTRAMUSCULAR | Status: AC
  Filled 2019-04-27: qty 50

## 2019-04-28 ENCOUNTER — Inpatient Hospital Stay: Payer: PPO | Admitting: Hematology and Oncology

## 2019-04-28 LAB — SAMPLE TO BLOOD BANK

## 2019-04-29 ENCOUNTER — Encounter: Payer: Self-pay | Admitting: Hematology and Oncology

## 2019-04-29 ENCOUNTER — Other Ambulatory Visit: Payer: Self-pay

## 2019-04-29 ENCOUNTER — Other Ambulatory Visit: Payer: Self-pay | Admitting: Hematology and Oncology

## 2019-04-29 ENCOUNTER — Ambulatory Visit: Payer: PPO | Admitting: Physical Therapy

## 2019-04-29 ENCOUNTER — Encounter: Payer: Self-pay | Admitting: Physical Therapy

## 2019-04-29 ENCOUNTER — Inpatient Hospital Stay (HOSPITAL_BASED_OUTPATIENT_CLINIC_OR_DEPARTMENT_OTHER): Payer: PPO | Admitting: Hematology and Oncology

## 2019-04-29 DIAGNOSIS — M6281 Muscle weakness (generalized): Secondary | ICD-10-CM | POA: Diagnosis not present

## 2019-04-29 DIAGNOSIS — C541 Malignant neoplasm of endometrium: Secondary | ICD-10-CM | POA: Diagnosis not present

## 2019-04-29 DIAGNOSIS — R262 Difficulty in walking, not elsewhere classified: Secondary | ICD-10-CM

## 2019-04-29 DIAGNOSIS — G620 Drug-induced polyneuropathy: Secondary | ICD-10-CM | POA: Diagnosis not present

## 2019-04-29 DIAGNOSIS — T451X5A Adverse effect of antineoplastic and immunosuppressive drugs, initial encounter: Secondary | ICD-10-CM | POA: Diagnosis not present

## 2019-04-29 DIAGNOSIS — D6481 Anemia due to antineoplastic chemotherapy: Secondary | ICD-10-CM

## 2019-04-29 DIAGNOSIS — R252 Cramp and spasm: Secondary | ICD-10-CM

## 2019-04-29 NOTE — Assessment & Plan Note (Signed)
She has completed her treatment and most of the side effects of treatment are resolving Recent CT imaging show no evidence of disease She has future appointment to see GYN surgeon in 2 months I would defer to them for further follow-up

## 2019-04-29 NOTE — Progress Notes (Signed)
[place] OFFICE PROGRESS NOTE  Patient Care Team: [place], Lauretta Grill, MD as PCP - General (Family Medicine) Calvert Cantor, MD as Consulting Physician (Ophthalmology)  ASSESSMENT & PLAN:  Endometrial cancer [facility]) She has completed her treatment and most of the side effects of treatment are resolving Recent CT imaging show no evidence of disease She has future appointment to see GYN surgeon in 2 months I would defer to them for further follow-up  Anemia due to antineoplastic chemotherapy Her anemia is improving I expect her blood count to normalize within the next 1 to 2 months She can just follow-up with primary care doctor in the future  Peripheral neuropathy due to chemotherapy [facility]) She has mild residual neuropathy but have stopped taking gabapentin as she did not find it helpful I recommend continue physical therapy and rehab   No orders of the defined types were placed in this encounter.   INTERVAL HISTORY: Please see below for problem oriented charting. 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  The patient reports 6 months or maybe longer of intermittent vaginal spotting.  She was seen by Dr. [name] on September 19, 2018 who performed a Pap smear which revealed atypical glandular cells and an endocervical biopsy which revealed a benign endocervical polyp, and endometrial biopsy which revealed high-grade serous endometrial adenocarcinoma and adenocarcinoma FIGO grade 2.    10/03/2018 Imaging   CT abdomen and pelvis 1. Enhancing soft tissue density in endometrial cavity, consistent with history of endometrial carcinoma. No evidence of extra uterine tumor extension or metastatic disease. 2. Tiny less than 1 cm anterior uterine fibroid. 3. Small hiatal hernia.  Aortic Atherosclerosis (ICD10-I70.0).   10/14/2018 Pathology Results   1. Lymph node, sentinel, biopsy, right external iliac - METASTATIC CARCINOMA PRESENT IN (1) OF (1) LYMPH NODE. 2. Lymph node, sentinel, biopsy, right deep obturator - ONE LYMPH NODE, NEGATIVE FOR CARCINOMA (0/1). 3. Lymph node, sentinel, biopsy, left external iliac - ONE LYMPH NODE, NEGATIVE FOR CARCINOMA (0/1). 4. Uterus +/- tubes/ovaries, neoplastic, cervix, bilateral fallopian tubes and ovaries - MIXED CELL CARCINOMA OF ENDOMETRIUM (MIXED ENDOMETRIOID CARCINOMA AND SEROUS CARCINOMA), WITH INVASION LESS THAN HALF OF THE MYOMETRIUM. - NO INVOLVEMENT OF UTERINE SEROSA, CERVICAL STROMA OR ADNEXA. - SEE ONCOLOGY TABLE. Microscopic Comment 4. UTERUS, CARCINOMA OR CARCINOSARCOMA Procedure: Total hysterectomy and bilateral salpingo-oophorectomy Histologic type: Mixed cell carcinoma (Endometrioid carcinoma 70% and Serous carcinoma 30%) Histologic Grade: Not applicable Myometrial Invasion: Present Depth of invasion: 6 mm Myometrial thickness: 17 mm Uterine Serosa Involvement: Not identified Cervical Stromal Involvement: Not identified Extent of Involvement of Other Organs:  Not involved Lymphovascular Invasion: Present Regional Lymph  Nodes: Examined: 3 Sentinel 0 Non-sentinel 3 Total Lymph nodes with metastases: 1 Isolated tumor cells (< 0.2 mm): 0 Micrometastasis (> 0.2 mm and < 2.0 mm): 0 Macrometastasis (> 2.0 mm): 1 Extracapsular extension: No definite ECE MMR/MSI Testing: Will be performed and the results reported separately Pathologic Stage Classification (pTNM, AJCC 8th edition): pT1a, pN1a FIGO Stage: III-C1 Representative Tumor Block: 1-A Comment: The size of the metastatic deposit in the right external iliac sentinel lymph node is 32 mm. The metastatic deposit is virtually all serous carcinoma. Intradepartmental consultation was obtained   10/14/2018 Surgery   Pre-operative Diagnosis: endometrial cancer grade 3 (serous), obesity  Operation: Robotic-assisted laparoscopic total hysterectomy with bilateral salpingoophorectomy, SLN,  biopsy (22 modifier for obesity, BMI 32).  Surgeon: Donaciano Eva  Operative Findings:  : 8cm arcuate uterus, normal appearing tubes and ovaries. Obesity with BMI 32. Narrow vaginal introitus.     10/17/2018 Cancer Staging   Staging form: Corpus Uteri - Carcinoma and Carcinosarcoma, AJCC 8th Edition - Pathologic: FIGO Stage IIIC1 (pT1, pN1a, cM0) - Signed by Heath Lark, MD on 10/17/2018    Genetic Testing   Patient has genetic testing done for MMR. Results revealed patient has the following mutation(s): MMR: Normal    Genetic Testing   Patient has genetic testing done for MSI on pathology from 10/14/18. Results revealed patient has the following: MSI Stable   11/06/2018 Procedure   Placement of single lumen port a cath via right internal jugular vein. The catheter tip lies at the cavo-atrial junction. A power injectable port a cath was placed and is ready for immediate use.   11/10/2018 -  Chemotherapy   The patient had carboplatin and taxol for chemotherapy treatment.     11/10/2018 -  Chemotherapy    The patient had carboplatin and taxol for chemotherapy treatment.     04/27/2019 Imaging   Interval hysterectomy. No evidence of metastatic disease or other acute findings within the abdomen or pelvis.   Aortic Atherosclerosis (ICD10-I70.0).     REVIEW OF SYSTEMS:   Constitutional: Denies fevers, chills or abnormal weight loss Eyes: Denies blurriness of vision Ears, nose, mouth, throat, and face: Denies mucositis or sore throat Respiratory: Denies cough, dyspnea or wheezes Cardiovascular: Denies palpitation, chest discomfort or lower extremity swelling Gastrointestinal:  Denies nausea, heartburn or change in bowel habits Skin: Denies abnormal skin rashes Lymphatics: Denies new lymphadenopathy or easy bruising Neurological:Denies numbness, tingling or new weaknesses Behavioral/Psych: Mood is stable, no new changes  All other systems were reviewed with the patient and are negative.  I have reviewed the past medical history, past surgical history, social history and family history with the patient and they are unchanged from previous note.  ALLERGIES:  is allergic to sulfa antibiotics.  MEDICATIONS:  No current outpatient medications on file.   No current facility-administered medications for this visit.     PHYSICAL EXAMINATION: ECOG PERFORMANCE STATUS: 1 - Symptomatic but completely ambulatory  Vitals:   04/29/19 1241  BP: (!) 164/75  Pulse: 95  Resp: 16  Temp: 98.9 F (37.2 C)  SpO2: 100%   Filed Weights   04/29/19 1241  Weight: 170 lb 9.6 oz (77.4 kg)    GENERAL:alert, no distress and comfortable SKIN: Noted facial bruising from recent fall NEURO: alert & oriented x 3 with fluent speech, no focal motor/sensory deficits  LABORATORY DATA:  I have reviewed the data as listed    Component Value Date/Time   NA 138 04/27/2019 0845   NA  141 07/05/2016 1642   K 4.2 04/27/2019 0845   CL 101 04/27/2019 0845   CO2 25 04/27/2019 0845   GLUCOSE 99 04/27/2019 0845    BUN 7 (L) 04/27/2019 0845   BUN 10 07/05/2016 1642   CREATININE 0.86 04/27/2019 0845   CREATININE 0.86 05/22/2012 1415   CALCIUM 9.5 04/27/2019 0845   PROT 7.0 04/27/2019 0845   ALBUMIN 4.2 04/27/2019 0845   AST 22 04/27/2019 0845   ALT 18 04/27/2019 0845   ALKPHOS 134 (H) 04/27/2019 0845   BILITOT 0.6 04/27/2019 0845   GFRNONAA >60 04/27/2019 0845   GFRAA >60 04/27/2019 0845    No results found for: SPEP, UPEP  Lab Results  Component Value Date   WBC 5.2 04/27/2019   NEUTROABS 3.1 04/27/2019   HGB 10.0 (L) 04/27/2019   HCT 29.7 (L) 04/27/2019   MCV 96.7 04/27/2019   PLT 212 04/27/2019      Chemistry      Component Value Date/Time   NA 138 04/27/2019 0845   NA 141 07/05/2016 1642   K 4.2 04/27/2019 0845   CL 101 04/27/2019 0845   CO2 25 04/27/2019 0845   BUN 7 (L) 04/27/2019 0845   BUN 10 07/05/2016 1642   CREATININE 0.86 04/27/2019 0845   CREATININE 0.86 05/22/2012 1415      Component Value Date/Time   CALCIUM 9.5 04/27/2019 0845   ALKPHOS 134 (H) 04/27/2019 0845   AST 22 04/27/2019 0845   ALT 18 04/27/2019 0845   BILITOT 0.6 04/27/2019 0845       RADIOGRAPHIC STUDIES: I have reviewed imaging study with the patient I have personally reviewed the radiological images as listed and agreed with the findings in the report. Ct Abdomen Pelvis W Contrast  Result Date: 04/27/2019 CLINICAL DATA:  Follow-up endometrial carcinoma. Recently completed chemotherapy and radiation therapy. EXAM: CT ABDOMEN AND PELVIS WITH CONTRAST TECHNIQUE: Multidetector CT imaging of the abdomen and pelvis was performed using the standard protocol following bolus administration of intravenous contrast. CONTRAST:  132m OMNIPAQUE IOHEXOL 300 MG/ML  SOLN COMPARISON:  10/03/2018 FINDINGS: Lower Chest: No acute findings. Hepatobiliary: No hepatic masses identified. Gallbladder is unremarkable. No evidence of biliary ductal dilatation. Pancreas:  No mass or inflammatory changes. Spleen: Within  normal limits in size and appearance. Adrenals/Urinary Tract: No masses identified. No evidence of hydronephrosis. Stomach/Bowel: No evidence of obstruction, inflammatory process or abnormal fluid collections. Normal appendix visualized. Vascular/Lymphatic: No pathologically enlarged lymph nodes. No abdominal aortic aneurysm. Aortic atherosclerosis. Reproductive: Prior hysterectomy noted. Vaginal cuff is normal in appearance. Adnexal regions are unremarkable in appearance. Other:  None. Musculoskeletal:  No suspicious bone lesions identified. IMPRESSION: Interval hysterectomy. No evidence of metastatic disease or other acute findings within the abdomen or pelvis. Aortic Atherosclerosis (ICD10-I70.0). Electronically Signed   By: JMarlaine HindM.D.   On: 04/27/2019 11:17    All questions were answered. The patient knows to call the clinic with any problems, questions or concerns. No barriers to learning was detected.  I spent 15 minutes counseling the patient face to face. The total time spent in the appointment was 20 minutes and more than 50% was on counseling and review of test results  NHeath Lark MD 04/29/2019 1:30 PM

## 2019-04-29 NOTE — Patient Instructions (Signed)
Access Code: [email or web address]  URL: [email or web address]  Date: 04/29/2019  Prepared by: Jari Favre   Exercises  Sit to Stand with Pelvic Floor Contraction - 2 reps - 1 sets - 2x daily - 7x weekly  Supine Diaphragmatic Breathing with Pelvic Floor Lengthening - 10 reps - 1 sets - 3x daily - 7x weekly  Supine Pelvic Floor Contract and Release - 10 reps - 1 sets - 2x daily - 7x weekly  Supine Pelvic Floor Stretch - 3 reps - 1 sets - 30 sec hold - 1x daily - 7x weekly  Hooklying Single Knee to Chest Stretch - 5 reps - 1 sets - 20 sec hold - 1x daily - 7x weekly  Supine Lower Trunk Rotation - 10 reps - 1 sets - 5 sec hold - 1x daily - 7x weekly  Seated Hip Abduction with Resistance - 10 reps - 3 sets - 1x daily - 7x weekly  Seated March - 10 reps - 3 sets - 1x daily - 7x weekly  Seated Long Arc Quad with Hip Adduction - 10 reps - 3 sets - 1x daily - 7x weekly

## 2019-04-29 NOTE — Assessment & Plan Note (Signed)
She has mild residual neuropathy but have stopped taking gabapentin as she did not find it helpful I recommend continue physical therapy and rehab

## 2019-04-29 NOTE — Assessment & Plan Note (Signed)
Her anemia is improving I expect her blood count to normalize within the next 1 to 2 months She can just follow-up with primary care doctor in the future

## 2019-04-29 NOTE — Therapy (Signed)
[facility] [address] [place], [place], [address] Phone: [phone]   Fax:  [phone]  Physical Therapy Treatment  Patient Details  Name: Zoe Kim MRN: [id number] Date of Birth: Mar 24, 1946 Referring Provider (PT): Heath Lark, MD   Encounter Date: 04/29/2019  PT End of Session - 04/29/19 0950    Visit Number  3    Date for PT Re-Evaluation  06/08/19    PT Start Time  0929    PT Stop Time  1010    PT Time Calculation (min)  41 min    Activity Tolerance  Patient tolerated treatment well    Behavior During Therapy  [facility] for tasks assessed/performed       Past Medical History:  Diagnosis Date  . Anxiety   . Arthritis   . Depression   . Endometrial ca ([place])   . Family history of breast cancer   . Family history of lung cancer   . Heart murmur    "years ago"  . Insomnia   . Pneumonia    "long time ago"  . Primary localized osteoarthrosis of the knee, right   . Urinary urgency     Past Surgical History:  Procedure Laterality Date  . COLONOSCOPY    . EYE SURGERY Bilateral    cataract with lens  . IR IMAGING GUIDED PORT INSERTION  11/06/2018  . IR REMOVAL TUN ACCESS W/ PORT W/O FL MOD SED  02/13/2019  . ROBOTIC ASSISTED TOTAL HYSTERECTOMY WITH BILATERAL SALPINGO OOPHERECTOMY N/A 10/14/2018   Procedure: ROBOTIC ASSISTED TOTAL HYSTERECTOMY WITH BILATERAL SALPINGO OOPHORECTOMY;  Surgeon: Everitt Amber, MD;  Location: [facility];  Service: Gynecology;  Laterality: N/A;  . SENTINEL NODE BIOPSY N/A 10/14/2018   Procedure: SENTINEL LYMPH NODE BIOPSY;  Surgeon: Everitt Amber, MD;  Location: [facility];  Service: Gynecology;  Laterality: N/A;  . TONSILLECTOMY    . TOOTH EXTRACTION Right 08/27/2017  . TOTAL KNEE ARTHROPLASTY Right 07/30/2016   Procedure: TOTAL KNEE ARTHROPLASTY;  Surgeon: Elsie Saas, MD;  Location: [place];  Service: Orthopedics;  Laterality: Right;  . VEIN LIGATION AND STRIPPING      There were  no vitals filed for this visit.  Subjective Assessment - 04/29/19 0936    Subjective  Pt tripped on Saturday and fell on her head and knee. Pt has a knot on the Rt side of her forehead and bruising around the eyes.  She reports she scraped her Rt knee. Denies pain currently, but has not had energy to    Currently in Pain?  No/denies                       [facility] Adult PT Treatment/Exercise - 04/29/19 0001      Lumbar Exercises: Stretches   Single Knee to Chest Stretch  Right;Left;20 seconds    Lower Trunk Rotation  5 reps;10 seconds    Figure 4 Stretch  20 seconds;With overpressure;2 reps      Lumbar Exercises: Aerobic   Nustep  seat 6; L1 x 6 min - PT present for status update      Lumbar Exercises: Seated   Long Arc Quad on [place]  Strengthening;Both;20 reps    Hip Flexion on Ball Limitations  on mat table 20x    Other Seated Lumbar Exercises  UE flexion 1lb weight      Lumbar Exercises: Supine   Heel Slides  10 reps    Other Supine Lumbar Exercises  UE flex/ext with 1lb - 10x each side             PT Education - 04/29/19 1015    Education Details  Access Code: [email or web address]    Person(s) Educated  Patient    Methods  Explanation;Demonstration;Tactile cues;Verbal cues;Handout    Comprehension  Verbalized understanding;Returned demonstration       PT Short Term Goals - 04/14/19 0832      PT SHORT TERM GOAL #1   Title  ind with initial HEP    Time  4    Period  Weeks    Status  New    Target Date  05/11/19      PT SHORT TERM GOAL #2   Title  Pt will report she is able to walk up to 300 ft without stopping to rest    Baseline  100 ft at the most    Time  4    Period  Weeks    Status  New    Target Date  05/11/19      PT SHORT TERM GOAL #3   Title  Pt will report using 3 diapers/ day due to decreased leakage    Baseline  6/day    Time  4    Period  Weeks    Status  New    Target Date  05/11/19      PT SHORT TERM GOAL #4   Title  Pt will be able  to perfrom 6 min walk test due to improved endurance    Time  4    Period  Weeks    Status  New    Target Date  05/11/19        PT Long Term Goals - 04/14/19 0836      PT LONG TERM GOAL #1   Title  Pt will be ind with advanced HEP    Time  8    Period  Weeks    Status  New    Target Date  06/08/19      PT LONG TERM GOAL #2   Title  pt will report leakage 1x/day at most and able to hold bladder for up to 2 hours when standing/walking    Baseline  6x and less than one hour when standing/walking    Time  8    Period  Weeks    Status  New    Target Date  06/08/19      PT LONG TERM GOAL #3   Title  Pt will be able to perform single leg stand x10 sec bilaterally for reduced risk of falls    Time  8    Period  Weeks    Status  New    Target Date  06/08/19      PT LONG TERM GOAL #4   Title  pt will be able to perform 5x sit to stand in <13 sec for reduced risk of falls and improved strength    Time  8    Period  Weeks    Status  New    Target Date  06/08/19            Plan - 04/29/19 1015    Clinical Impression Statement  Pt did well during today's treatment.  She reported fatigue afer 10-20 reps of most exercises today.  She was able to successfully make it through the entire treatment without any long breaks.  Pt was given updates to HEP to add more  challenging exercises. Pt will continue to benefit from skilled PT according to POC.    PT Treatment/Interventions  ADLs/Self Care Home Management;Biofeedback;Cryotherapy;Electrical Stimulation;Moist Heat;Therapeutic activities;Therapeutic exercise;Manual techniques;Taping;Dry needling;Passive range of motion;Neuromuscular re-education    PT Next Visit Plan  start with nustep for endurance, biofeedback and internal for improved circular contraction, exhale on exertion, basic core and LE strengthening    PT Home Exercise Plan  Access Code: [email or web address]    Consulted and Agree with Plan of Care  Patient       Patient will  benefit from skilled therapeutic intervention in order to improve the following deficits and impairments:  Abnormal gait, Pain, Increased fascial restricitons, Decreased strength, Difficulty walking, Decreased activity tolerance, Increased muscle spasms, Impaired sensation  Visit Diagnosis: Muscle weakness (generalized)  Difficulty in walking, not elsewhere classified  Cramp and spasm     Problem List Patient Active Problem List   Diagnosis Date Noted  . Yeast infection 03/19/2019  . Urinary incontinence 02/13/2019  . Coagulase negative Staphylococcus bacteremia 02/12/2019  . SIRS (systemic inflammatory response syndrome) ([place]) 02/11/2019  . Tachycardia 01/13/2019  . Neutropenic fever ([place]) 12/30/2018  . Anemia due to antineoplastic chemotherapy 12/24/2018  . Mild depression ([place]) 12/03/2018  . Other constipation 11/20/2018  . Peripheral neuropathy due to chemotherapy ([place]) 11/20/2018  . Family history of breast cancer   . Family history of lung cancer   . Bone pain 11/17/2018  . Obesity 10/14/2018  . Endometrial cancer ([place]) 10/01/2018  . Primary localized osteoarthritis of right knee 07/30/2016  . Anxiety   . Primary localized osteoarthrosis of the knee, right   . Urinary urgency     Jule Ser, PT 04/29/2019, 10:18 AM  [facility] [address] [place], [place], [address] Phone: [phone]   Fax:  [phone]  Name: Zoe Kim MRN: [id number] Date of Birth: Nov 25, 1945

## 2019-05-04 ENCOUNTER — Other Ambulatory Visit: Payer: Self-pay

## 2019-05-04 ENCOUNTER — Encounter: Payer: Self-pay | Admitting: Physical Therapy

## 2019-05-04 ENCOUNTER — Ambulatory Visit: Payer: PPO | Admitting: Physical Therapy

## 2019-05-04 DIAGNOSIS — R262 Difficulty in walking, not elsewhere classified: Secondary | ICD-10-CM

## 2019-05-04 DIAGNOSIS — R252 Cramp and spasm: Secondary | ICD-10-CM

## 2019-05-04 DIAGNOSIS — M6281 Muscle weakness (generalized): Secondary | ICD-10-CM | POA: Diagnosis not present

## 2019-05-04 NOTE — Therapy (Signed)
[facility] [address] [place], [place], [address] Phone: [phone]   Fax:  [phone]  Physical Therapy Treatment  Patient Details  Name: Zoe Kim MRN: [id number] Date of Birth: Sep 04, 1945 Referring Provider (PT): Heath Lark, MD   Encounter Date: 05/04/2019  PT End of Session - 05/04/19 [id number]    Visit Number  4    Date for PT Re-Evaluation  06/08/19    PT Start Time  0928    PT Stop Time  1008    PT Time Calculation (min)  40 min    Activity Tolerance  Patient tolerated treatment well;Patient limited by fatigue    Behavior During Therapy  [facility] for tasks assessed/performed       Past Medical History:  Diagnosis Date  . Anxiety   . Arthritis   . Depression   . Endometrial ca ([place])   . Family history of breast cancer   . Family history of lung cancer   . Heart murmur    "years ago"  . Insomnia   . Pneumonia    "long time ago"  . Primary localized osteoarthrosis of the knee, right   . Urinary urgency     Past Surgical History:  Procedure Laterality Date  . COLONOSCOPY    . EYE SURGERY Bilateral    cataract with lens  . IR IMAGING GUIDED PORT INSERTION  11/06/2018  . IR REMOVAL TUN ACCESS W/ PORT W/O FL MOD SED  02/13/2019  . ROBOTIC ASSISTED TOTAL HYSTERECTOMY WITH BILATERAL SALPINGO OOPHERECTOMY N/A 10/14/2018   Procedure: ROBOTIC ASSISTED TOTAL HYSTERECTOMY WITH BILATERAL SALPINGO OOPHORECTOMY;  Surgeon: Everitt Amber, MD;  [facility];  Service: Gynecology;  Laterality: N/A;  . SENTINEL NODE BIOPSY N/A 10/14/2018   Procedure: SENTINEL LYMPH NODE BIOPSY;  Surgeon: Everitt Amber, MD;  [facility];  Service: Gynecology;  Laterality: N/A;  . TONSILLECTOMY    . TOOTH EXTRACTION Right 08/27/2017  . TOTAL KNEE ARTHROPLASTY Right 07/30/2016   Procedure: TOTAL KNEE ARTHROPLASTY;  Surgeon: Elsie Saas, MD;  Location: [place];  Service: Orthopedics;  Laterality: Right;  . VEIN LIGATION AND  STRIPPING      There were no vitals filed for this visit.  Subjective Assessment - 05/04/19 0935    Subjective  Pt states she did some exercises but she was very tired because of her sleeping meds.  Pt states she has been doing the exercise bike                       [facility] Adult PT Treatment/Exercise - 05/04/19 0001      Lumbar Exercises: Stretches   Active Hamstring Stretch  Right;Left;3 reps;20 seconds    Gastroc Stretch  Right;Left;20 seconds;2 reps      Lumbar Exercises: Aerobic   Nustep  seat 6; L1 x 10 min - PT present for status update      Lumbar Exercises: Standing   Row  Strengthening;20 reps;Theraband    Theraband Level (Row)  Level 2 (Red)    Shoulder Extension  Strengthening;20 reps;Theraband    Theraband Level (Shoulder Extension)  Level 2 (Red)      Lumbar Exercises: Seated   Long Arc Quad on Chair  Strengthening;Right;Left;10 reps;Weights    LAQ on Chair Weights (lbs)  1    Long Arc [employer] on [place]  Strengthening;Both;20 reps    LAQ on [place] Limitations  sitting on chair ball squeeze    Sit to [employer]	[place]  10 reps   blow as you go   Other Seated Lumbar Exercises  UE flexion 1lb weight             PT Education - 05/04/19 1040    Education Details  Access Code: [email or web address]    Person(s) Educated  Patient    Methods  Explanation;Demonstration;Handout;Verbal cues;Tactile cues    Comprehension  Verbalized understanding;Returned demonstration       PT Short Term Goals - 04/14/19 0832      PT SHORT TERM GOAL #1   Title  ind with initial HEP    Time  4    Period  Weeks    Status  New    Target Date  05/11/19      PT SHORT TERM GOAL #2   Title  Pt will report she is able to walk up to 300 ft without stopping to rest    Baseline  100 ft at the most    Time  4    Period  Weeks    Status  New    Target Date  05/11/19      PT SHORT TERM GOAL #3   Title  Pt will report using 3 diapers/ day due to decreased leakage    Baseline  6/day    Time  4     Period  Weeks    Status  New    Target Date  05/11/19      PT SHORT TERM GOAL #4   Title  Pt will be able to perfrom 6 min walk test due to improved endurance    Time  4    Period  Weeks    Status  New    Target Date  05/11/19        PT Long Term Goals - 04/14/19 0836      PT LONG TERM GOAL #1   Title  Pt will be ind with advanced HEP    Time  8    Period  Weeks    Status  New    Target Date  06/08/19      PT LONG TERM GOAL #2   Title  pt will report leakage 1x/day at most and able to hold bladder for up to 2 hours when standing/walking    Baseline  6x and less than one hour when standing/walking    Time  8    Period  Weeks    Status  New    Target Date  06/08/19      PT LONG TERM GOAL #3   Title  Pt will be able to perform single leg stand x10 sec bilaterally for reduced risk of falls    Time  8    Period  Weeks    Status  New    Target Date  06/08/19      PT LONG TERM GOAL #4   Title  pt will be able to perform 5x sit to stand in <13 sec for reduced risk of falls and improved strength    Time  8    Period  Weeks    Status  New    Target Date  06/08/19            Plan - 05/04/19 1040    Clinical Impression Statement  Pt tolerated exercise progressions today.  She is able to perform sit to stand from chair with mat on it.  Pt fatigues during exercises and is fatigued at the end of  the session today due to core weakness.  She needs cues to lift her chest and lengthen through her spine.  pt will benefit from skilled PT to continue to address impairments and improve function according to POC.    PT Treatment/Interventions  ADLs/Self Care Home Management;Biofeedback;Cryotherapy;Electrical Stimulation;Moist Heat;Therapeutic activities;Therapeutic exercise;Manual techniques;Taping;Dry needling;Passive range of motion;Neuromuscular re-education    PT Next Visit Plan  start with nustep for endurance, biofeedback and internal for improved circular contraction, exhale  on exertion, basic core and LE strengthening    PT Home Exercise Plan  Access Code: [email or web address]    Recommended Other Services  intial cert signed    Consulted and Agree with Plan of Care  Patient       Patient will benefit from skilled therapeutic intervention in order to improve the following deficits and impairments:  Abnormal gait, Pain, Increased fascial restricitons, Decreased strength, Difficulty walking, Decreased activity tolerance, Increased muscle spasms, Impaired sensation  Visit Diagnosis: Muscle weakness (generalized)  Difficulty in walking, not elsewhere classified  Cramp and spasm     Problem List Patient Active Problem List   Diagnosis Date Noted  . Yeast infection 03/19/2019  . Urinary incontinence 02/13/2019  . SIRS (systemic inflammatory response syndrome) ([place]) 02/11/2019  . Tachycardia 01/13/2019  . Neutropenic fever ([place]) 12/30/2018  . Anemia due to antineoplastic chemotherapy 12/24/2018  . Mild depression ([place]) 12/03/2018  . Peripheral neuropathy due to chemotherapy ([place]) 11/20/2018  . Family history of breast cancer   . Family history of lung cancer   . Obesity 10/14/2018  . Endometrial cancer ([place]) 10/01/2018  . Primary localized osteoarthritis of right knee 07/30/2016  . Anxiety   . Primary localized osteoarthrosis of the knee, right   . Urinary urgency     Jule Ser, PT 05/04/2019, 10:45 AM  [facility] [address] [place], [place], [address] Phone: [phone]   Fax:  [phone]  Name: Zoe Kim MRN: [id number] Date of Birth: 03-05-1946

## 2019-05-04 NOTE — Patient Instructions (Signed)
Access Code: [email or web address]  URL: [email or web address]  Date: 05/04/2019  Prepared by: Jari Favre   Exercises  Sit to Stand with Pelvic Floor Contraction - 2 reps - 1 sets - 2x daily - 7x weekly  Supine Diaphragmatic Breathing with Pelvic Floor Lengthening - 10 reps - 1 sets - 3x daily - 7x weekly  Supine Pelvic Floor Contract and Release - 10 reps - 1 sets - 2x daily - 7x weekly  Supine Pelvic Floor Stretch - 3 reps - 1 sets - 30 sec hold - 1x daily - 7x weekly  Hooklying Single Knee to Chest Stretch - 5 reps - 1 sets - 20 sec hold - 1x daily - 7x weekly  Supine Lower Trunk Rotation - 10 reps - 1 sets - 5 sec hold - 1x daily - 7x weekly  Seated Hip Abduction with Resistance - 10 reps - 3 sets - 1x daily - 7x weekly  Seated March - 10 reps - 3 sets - 1x daily - 7x weekly  Seated Long Arc Quad with Hip Adduction - 10 reps - 3 sets - 1x daily - 7x weekly  Heel rises with counter support - 10 reps - 1 sets - 1x daily - 7x weekly  Heel Toe Raises with Counter Support - 10 reps - 1 sets - 1x daily - 7x weekly

## 2019-05-07 ENCOUNTER — Encounter: Payer: Self-pay | Admitting: Physical Therapy

## 2019-05-07 ENCOUNTER — Ambulatory Visit: Payer: PPO | Attending: Hematology and Oncology | Admitting: Physical Therapy

## 2019-05-07 ENCOUNTER — Other Ambulatory Visit: Payer: Self-pay

## 2019-05-07 DIAGNOSIS — R262 Difficulty in walking, not elsewhere classified: Secondary | ICD-10-CM | POA: Diagnosis not present

## 2019-05-07 DIAGNOSIS — R252 Cramp and spasm: Secondary | ICD-10-CM | POA: Diagnosis not present

## 2019-05-07 DIAGNOSIS — M6281 Muscle weakness (generalized): Secondary | ICD-10-CM

## 2019-05-07 NOTE — Therapy (Signed)
[facility] [address], [place], [place], [address] Phone: [phone]   Fax:  [phone]  Physical Therapy Treatment  Patient Details  Name: Zoe Kim MRN: [id number] Date of Birth: 12-Oct-1945 Referring Provider (PT): Heath Lark, MD   Encounter Date: 05/07/2019  PT End of Session - 05/07/19 0926    Visit Number  5    Date for PT Re-Evaluation  06/08/19    PT Start Time  0924    PT Stop Time  1005    PT Time Calculation (min)  41 min    Activity Tolerance  Patient tolerated treatment well;Patient limited by fatigue    Behavior During Therapy  [facility] for tasks assessed/performed       Past Medical History:  Diagnosis Date  . Anxiety   . Arthritis   . Depression   . Endometrial ca ([place])   . Family history of breast cancer   . Family history of lung cancer   . Heart murmur    "years ago"  . Insomnia   . Pneumonia    "long time ago"  . Primary localized osteoarthrosis of the knee, right   . Urinary urgency     Past Surgical History:  Procedure Laterality Date  . COLONOSCOPY    . EYE SURGERY Bilateral    cataract with lens  . IR IMAGING GUIDED PORT INSERTION  11/06/2018  . IR REMOVAL TUN ACCESS W/ PORT W/O FL MOD SED  02/13/2019  . ROBOTIC ASSISTED TOTAL HYSTERECTOMY WITH BILATERAL SALPINGO OOPHERECTOMY N/A 10/14/2018   Procedure: ROBOTIC ASSISTED TOTAL HYSTERECTOMY WITH BILATERAL SALPINGO OOPHORECTOMY;  Surgeon: Everitt Amber, MD;  [facility];  Service: Gynecology;  Laterality: N/A;  . SENTINEL NODE BIOPSY N/A 10/14/2018   Procedure: SENTINEL LYMPH NODE BIOPSY;  Surgeon: Everitt Amber, MD;  [facility];  Service: Gynecology;  Laterality: N/A;  . TONSILLECTOMY    . TOOTH EXTRACTION Right 08/27/2017  . TOTAL KNEE ARTHROPLASTY Right 07/30/2016   Procedure: TOTAL KNEE ARTHROPLASTY;  Surgeon: Elsie Saas, MD;  Location: [place];  Service: Orthopedics;  Laterality: Right;  . VEIN LIGATION AND  STRIPPING      There were no vitals filed for this visit.  Subjective Assessment - 05/07/19 0927    Subjective  I walked down to the barn and back up.  I keep getting muscle spasms in my back.  I laid on a tennis ball and that helped    Patient Stated Goals  leakage, be able to walk up the hill    Currently in Pain?  No/denies                       [facility] Adult PT Treatment/Exercise - 05/07/19 0001      Lumbar Exercises: Stretches   Active Hamstring Stretch  Right;Left;3 reps;20 seconds    Hip Flexor Stretch  Right;Left;3 reps;20 seconds    Other Lumbar Stretch Exercise  thoracic ext with ball behind back      Lumbar Exercises: Aerobic   Nustep  seat 6; L1 x 10 min - PT present for status update   2 laps in 9'50"     Lumbar Exercises: Standing   Shoulder Extension  Strengthening;20 reps;Theraband    Theraband Level (Shoulder Extension)  Level 2 (Red)    Other Standing Lumbar Exercises  plie x 3 with pelvic floor contracted as she is lowering down      Lumbar Exercises: Seated  Long [employer] on Chair  Strengthening;Right;Left;10 reps;Weights    LAQ on [employer] (lbs)  1    LAQ on [place] Limitations  sitting on chair with grey disc march 1lb - 10x each    Other Seated Lumbar Exercises  UE flexion 2lb weight - 10x   sitting on grey disc              PT Short Term Goals - 04/14/19 0832      PT SHORT TERM GOAL #1   Title  ind with initial HEP    Time  4    Period  Weeks    Status  New    Target Date  05/11/19      PT SHORT TERM GOAL #2   Title  Pt will report she is able to walk up to 300 ft without stopping to rest    Baseline  100 ft at the most    Time  4    Period  Weeks    Status  New    Target Date  05/11/19      PT SHORT TERM GOAL #3   Title  Pt will report using 3 diapers/ day due to decreased leakage    Baseline  6/day    Time  4    Period  Weeks    Status  New    Target Date  05/11/19      PT SHORT TERM GOAL #4   Title  Pt  will be able to perfrom 6 min walk test due to improved endurance    Time  4    Period  Weeks    Status  New    Target Date  05/11/19        PT Long Term Goals - 04/14/19 0836      PT LONG TERM GOAL #1   Title  Pt will be ind with advanced HEP    Time  8    Period  Weeks    Status  New    Target Date  06/08/19      PT LONG TERM GOAL #2   Title  pt will report leakage 1x/day at most and able to hold bladder for up to 2 hours when standing/walking    Baseline  6x and less than one hour when standing/walking    Time  8    Period  Weeks    Status  New    Target Date  06/08/19      PT LONG TERM GOAL #3   Title  Pt will be able to perform single leg stand x10 sec bilaterally for reduced risk of falls    Time  8    Period  Weeks    Status  New    Target Date  06/08/19      PT LONG TERM GOAL #4   Title  pt will be able to perform 5x sit to stand in <13 sec for reduced risk of falls and improved strength    Time  8    Period  Weeks    Status  New    Target Date  06/08/19            Plan - 05/07/19 1033    Clinical Impression Statement  Pt was able to increase difficulty and resistance with exercises today.  She is planning on doing some bar work this upcoming week.  We reviewed plie and how to engage the pelvic floor when doing  the plie exercise at the bar.  Pt will benefit from skilled PT to continue progressions for strength and endurance.    PT Treatment/Interventions  ADLs/Self Care Home Management;Biofeedback;Cryotherapy;Electrical Stimulation;Moist Heat;Therapeutic activities;Therapeutic exercise;Manual techniques;Taping;Dry needling;Passive range of motion;Neuromuscular re-education    PT Next Visit Plan  start with nustep for endurance, squat progressions, strength and endurance, balance    PT Home Exercise Plan  Access Code: [email or web address]    Consulted and Agree with Plan of Care  Patient       Patient will benefit from skilled therapeutic intervention in order to  improve the following deficits and impairments:     Visit Diagnosis: Muscle weakness (generalized)  Difficulty in walking, not elsewhere classified  Cramp and spasm     Problem List Patient Active Problem List   Diagnosis Date Noted  . Yeast infection 03/19/2019  . Urinary incontinence 02/13/2019  . SIRS (systemic inflammatory response syndrome) ([place]) 02/11/2019  . Tachycardia 01/13/2019  . Neutropenic fever ([place]) 12/30/2018  . Anemia due to antineoplastic chemotherapy 12/24/2018  . Mild depression ([place]) 12/03/2018  . Peripheral neuropathy due to chemotherapy ([place]) 11/20/2018  . Family history of breast cancer   . Family history of lung cancer   . Obesity 10/14/2018  . Endometrial cancer ([place]) 10/01/2018  . Primary localized osteoarthritis of right knee 07/30/2016  . Anxiety   . Primary localized osteoarthrosis of the knee, right   . Urinary urgency     Jule Ser, PT 05/07/2019, 10:36 AM  [facility] [address], [place], [place], [address] Phone: [phone]   Fax:  [phone]  Name: SUNSHINE ZIPF MRN: [id number] Date of Birth: [birth year]

## 2019-05-12 ENCOUNTER — Ambulatory Visit: Payer: PPO | Admitting: Physical Therapy

## 2019-05-14 ENCOUNTER — Ambulatory Visit: Payer: PPO | Admitting: Physical Therapy

## 2019-05-18 ENCOUNTER — Encounter: Payer: Self-pay | Admitting: Physical Therapy

## 2019-05-18 ENCOUNTER — Encounter: Payer: Self-pay | Admitting: Licensed Clinical Social Worker

## 2019-05-18 ENCOUNTER — Other Ambulatory Visit: Payer: Self-pay

## 2019-05-18 ENCOUNTER — Ambulatory Visit: Payer: PPO | Admitting: Physical Therapy

## 2019-05-18 DIAGNOSIS — M6281 Muscle weakness (generalized): Secondary | ICD-10-CM | POA: Diagnosis not present

## 2019-05-18 DIAGNOSIS — R252 Cramp and spasm: Secondary | ICD-10-CM

## 2019-05-18 DIAGNOSIS — R262 Difficulty in walking, not elsewhere classified: Secondary | ICD-10-CM

## 2019-05-18 NOTE — Therapy (Signed)
[facility] [address] [place], [place], [address] Phone: [phone]   Fax:  [phone]  Physical Therapy Treatment  Patient Details  Name: Zoe Kim MRN: [id number] Date of Birth: 1946/04/02 Referring Provider (PT): Heath Lark, MD   Encounter Date: 05/18/2019  PT End of Session - 05/18/19 0930    Visit Number  6    Date for PT Re-Evaluation  06/08/19    PT Start Time  0930    PT Stop Time  1010    PT Time Calculation (min)  40 min    Activity Tolerance  Patient tolerated treatment well;Patient limited by fatigue    Behavior During Therapy  [facility] for tasks assessed/performed       Past Medical History:  Diagnosis Date  . Anxiety   . Arthritis   . Depression   . Endometrial ca ([place])   . Family history of breast cancer   . Family history of lung cancer   . Heart murmur    "years ago"  . Insomnia   . Pneumonia    "long time ago"  . Primary localized osteoarthrosis of the knee, right   . Urinary urgency     Past Surgical History:  Procedure Laterality Date  . COLONOSCOPY    . EYE SURGERY Bilateral    cataract with lens  . IR IMAGING GUIDED PORT INSERTION  11/06/2018  . IR REMOVAL TUN ACCESS W/ PORT W/O FL MOD SED  02/13/2019  . ROBOTIC ASSISTED TOTAL HYSTERECTOMY WITH BILATERAL SALPINGO OOPHERECTOMY N/A 10/14/2018   Procedure: ROBOTIC ASSISTED TOTAL HYSTERECTOMY WITH BILATERAL SALPINGO OOPHORECTOMY;  Surgeon: Everitt Amber, MD;  [facility];  Service: Gynecology;  Laterality: N/A;  . SENTINEL NODE BIOPSY N/A 10/14/2018   Procedure: SENTINEL LYMPH NODE BIOPSY;  Surgeon: Everitt Amber, MD;  [facility];  Service: Gynecology;  Laterality: N/A;  . TONSILLECTOMY    . TOOTH EXTRACTION Right 08/27/2017  . TOTAL KNEE ARTHROPLASTY Right 07/30/2016   Procedure: TOTAL KNEE ARTHROPLASTY;  Surgeon: Elsie Saas, MD;  Location: [place];  Service: Orthopedics;  Laterality: Right;  . VEIN LIGATION AND  STRIPPING      There were no vitals filed for this visit.  Subjective Assessment - 05/18/19 0938    Subjective  My back was still spasming but I got a massage and it has been better since Saturday.    Limitations  Walking    Patient Stated Goals  leakage, be able to walk up the hill    Currently in Pain?  No/denies                       [facility] Adult PT Treatment/Exercise - 05/18/19 0001      Lumbar Exercises: Stretches   Hip Flexor Stretch  Right;Left;3 reps;20 seconds      Lumbar Exercises: Aerobic   Nustep  seat 6; L1 x 10 min - PT present for status update   2 laps in 9'43     Lumbar Exercises: Standing   Other Standing Lumbar Exercises  plie x 6 with pelvic floor contracted as she is lowering down    Other Standing Lumbar Exercises  hip flexion with min UE - 1.5 lb weight tapping 6" step - 20x      Lumbar Exercises: Seated   Long Arc Quad on Chair  Strengthening;Right;Left;10 reps;Weights   ball squeeze   LAQ on Chair Weights (lbs)  --   1.5  Other Seated Lumbar Exercises  yellow band horizontal abduction and shoulder ER - posture and core engaged    Other Seated Lumbar Exercises  thoracic extension - sitting ball behind back; sitting ball roll for lumbar stretch and UE flexion               PT Short Term Goals - 05/18/19 0933      PT SHORT TERM GOAL #1   Title  ind with initial HEP    Status  Achieved      PT SHORT TERM GOAL #2   Title  Pt will report she is able to walk up to 300 ft without stopping to rest    Baseline  able to walk a couple of blocks outside without stopping    Status  Achieved        PT Long Term Goals - 04/14/19 0836      PT LONG TERM GOAL #1   Title  Pt will be ind with advanced HEP    Time  8    Period  Weeks    Status  New    Target Date  06/08/19      PT LONG TERM GOAL #2   Title  pt will report leakage 1x/day at most and able to hold bladder for up to 2 hours when standing/walking    Baseline  6x and  less than one hour when standing/walking    Time  8    Period  Weeks    Status  New    Target Date  06/08/19      PT LONG TERM GOAL #3   Title  Pt will be able to perform single leg stand x10 sec bilaterally for reduced risk of falls    Time  8    Period  Weeks    Status  New    Target Date  06/08/19      PT LONG TERM GOAL #4   Title  pt will be able to perform 5x sit to stand in <13 sec for reduced risk of falls and improved strength    Time  8    Period  Weeks    Status  New    Target Date  06/08/19            Plan - 05/18/19 0935    Clinical Impression Statement  Pt met a couple of goals because she has been able to do the initial HEP and she is walking more.  She is still having difficulty walking up the hill from the barn. Pt tolerated treatment well but was fatigued at the end of today's treatment.  She was able to increase resistance adding 1.5 lb ankle weight.  She did 2 laps on nustep 7 sec faster than previous session.  pt will continue to benefit from skilled PT to progress strength, endurance and balance    PT Treatment/Interventions  ADLs/Self Care Home Management;Biofeedback;Cryotherapy;Electrical Stimulation;Moist Heat;Therapeutic activities;Therapeutic exercise;Manual techniques;Taping;Dry needling;Passive range of motion;Neuromuscular re-education    PT Next Visit Plan  start with nustep for endurance, squat progressions, strength and endurance, balance    PT Home Exercise Plan  Access Code: [email or web address]    Consulted and Agree with Plan of Care  Patient       Patient will benefit from skilled therapeutic intervention in order to improve the following deficits and impairments:  Abnormal gait, Pain, Increased fascial restricitons, Decreased strength, Difficulty walking, Decreased activity tolerance, Increased muscle spasms, Impaired sensation  Visit Diagnosis:  Muscle weakness (generalized)  Difficulty in walking, not elsewhere classified  Cramp and  spasm     Problem List Patient Active Problem List   Diagnosis Date Noted  . Yeast infection 03/19/2019  . Urinary incontinence 02/13/2019  . SIRS (systemic inflammatory response syndrome) ([place]) 02/11/2019  . Tachycardia 01/13/2019  . Neutropenic fever ([place]) 12/30/2018  . Anemia due to antineoplastic chemotherapy 12/24/2018  . Mild depression ([place]) 12/03/2018  . Peripheral neuropathy due to chemotherapy ([place]) 11/20/2018  . Family history of breast cancer   . Family history of lung cancer   . Obesity 10/14/2018  . Endometrial cancer ([place]) 10/01/2018  . Primary localized osteoarthritis of right knee 07/30/2016  . Anxiety   . Primary localized osteoarthrosis of the knee, right   . Urinary urgency     Jule Ser, PT 05/18/2019, 10:15 AM  [facility] [address], [place], [place], [address] Phone: [phone]   Fax:  [phone]  Name: Zoe Kim MRN: [id number] Date of Birth: [birth year]

## 2019-05-21 ENCOUNTER — Ambulatory Visit: Payer: PPO | Admitting: Physical Therapy

## 2019-05-21 ENCOUNTER — Encounter: Payer: Self-pay | Admitting: Physical Therapy

## 2019-05-21 ENCOUNTER — Other Ambulatory Visit: Payer: Self-pay

## 2019-05-21 DIAGNOSIS — R252 Cramp and spasm: Secondary | ICD-10-CM

## 2019-05-21 DIAGNOSIS — R262 Difficulty in walking, not elsewhere classified: Secondary | ICD-10-CM

## 2019-05-21 DIAGNOSIS — M6281 Muscle weakness (generalized): Secondary | ICD-10-CM

## 2019-05-21 NOTE — Therapy (Signed)
[facility] [address], [place], [place], [address] Phone: [phone]   Fax:  [phone]  Physical Therapy Treatment  Patient Details  Name: Zoe Kim MRN: [id number] Date of Birth: 08-Dec-1945 Referring Provider (PT): Heath Lark, MD   Encounter Date: 05/21/2019  PT End of Session - 05/21/19 0932    Visit Number  7    Date for PT Re-Evaluation  06/08/19    PT Start Time  0930    PT Stop Time  1010    PT Time Calculation (min)  40 min    Activity Tolerance  Patient tolerated treatment well;Patient limited by fatigue    Behavior During Therapy  [facility] for tasks assessed/performed       Past Medical History:  Diagnosis Date  . Anxiety   . Arthritis   . Depression   . Endometrial ca ([place])   . Family history of breast cancer   . Family history of lung cancer   . Heart murmur    "years ago"  . Insomnia   . Pneumonia    "long time ago"  . Primary localized osteoarthrosis of the knee, right   . Urinary urgency     Past Surgical History:  Procedure Laterality Date  . COLONOSCOPY    . EYE SURGERY Bilateral    cataract with lens  . IR IMAGING GUIDED PORT INSERTION  11/06/2018  . IR REMOVAL TUN ACCESS W/ PORT W/O FL MOD SED  02/13/2019  . ROBOTIC ASSISTED TOTAL HYSTERECTOMY WITH BILATERAL SALPINGO OOPHERECTOMY N/A 10/14/2018   Procedure: ROBOTIC ASSISTED TOTAL HYSTERECTOMY WITH BILATERAL SALPINGO OOPHORECTOMY;  Surgeon: Everitt Amber, MD;  [facility]: [facility];  Service: Gynecology;  Laterality: N/A;  . SENTINEL NODE BIOPSY N/A 10/14/2018   Procedure: SENTINEL LYMPH NODE BIOPSY;  Surgeon: Everitt Amber, MD;  Location: [facility];  Service: Gynecology;  Laterality: N/A;  . TONSILLECTOMY    . TOOTH EXTRACTION Right 08/27/2017  . TOTAL KNEE ARTHROPLASTY Right 07/30/2016   Procedure: TOTAL KNEE ARTHROPLASTY;  Surgeon: Elsie Saas, MD;  Location: [place];  Service: Orthopedics;  Laterality: Right;  . VEIN LIGATION AND  STRIPPING      There were no vitals filed for this visit.  Subjective Assessment - 05/21/19 0933    Subjective  My back spasm was bothering me yesterday, got worse as the day went on. Spasm was under the left shoulder blade. Feels fine now, but feels like it is going to get worse.    Limitations  Walking    Patient Stated Goals  leakage, be able to walk up the hill    Currently in Pain?  No/denies                       [facility] Adult PT Treatment/Exercise - 05/21/19 0001      Lumbar Exercises: Stretches   Other Lumbar Stretch Exercise  thoracic ext i supine with towel - 10x 10 sec; pec stretch supine - goal post stretch x10 holding 20 sec x 1      Lumbar Exercises: Aerobic   Nustep  seat 6; L1 x 10 min - PT present for status update      Manual Therapy   Manual Therapy  Soft tissue mobilization    Soft tissue mobilization  Lt traps, latissimus, thoracic parapsinla               PT Short Term Goals - 05/18/19 [id number]  PT SHORT TERM GOAL #1   Title  ind with initial HEP    Status  Achieved      PT SHORT TERM GOAL #2   Title  Pt will report she is able to walk up to 300 ft without stopping to rest    Baseline  able to walk a couple of blocks outside without stopping    Status  Achieved        PT Long Term Goals - 04/14/19 0836      PT LONG TERM GOAL #1   Title  Pt will be ind with advanced HEP    Time  8    Period  Weeks    Status  New    Target Date  06/08/19      PT LONG TERM GOAL #2   Title  pt will report leakage 1x/day at most and able to hold bladder for up to 2 hours when standing/walking    Baseline  6x and less than one hour when standing/walking    Time  8    Period  Weeks    Status  New    Target Date  06/08/19      PT LONG TERM GOAL #3   Title  Pt will be able to perform single leg stand x10 sec bilaterally for reduced risk of falls    Time  8    Period  Weeks    Status  New    Target Date  06/08/19      PT LONG TERM GOAL  #4   Title  pt will be able to perform 5x sit to stand in <13 sec for reduced risk of falls and improved strength    Time  8    Period  Weeks    Status  New    Target Date  06/08/19            Plan - 05/21/19 1012    Clinical Impression Statement  Pt reports she is able to go 2-3 hours without using the bathroom now.  Currently she has been limited by upper back spasms.  Today's session focused on improving thoracic mobility. She responded well to treatment and has several stretches as well as knows about using tennis ball for self massage so she can maintain imporved mobility. Pt is still having leakage but improving.  She will benefit from skilled PT to cotninue to progress POC.    PT Treatment/Interventions  ADLs/Self Care Home Management;Biofeedback;Cryotherapy;Electrical Stimulation;Moist Heat;Therapeutic activities;Therapeutic exercise;Manual techniques;Taping;Dry needling;Passive range of motion;Neuromuscular re-education    PT Next Visit Plan  f/u on back spasms, start with nustep for endurance, squat progressions, strength and endurance, balance    PT Home Exercise Plan  Access Code: [email or web address]    Consulted and Agree with Plan of Care  Patient       Patient will benefit from skilled therapeutic intervention in order to improve the following deficits and impairments:  Abnormal gait, Pain, Increased fascial restricitons, Decreased strength, Difficulty walking, Decreased activity tolerance, Increased muscle spasms, Impaired sensation  Visit Diagnosis: Muscle weakness (generalized)  Difficulty in walking, not elsewhere classified  Cramp and spasm     Problem List Patient Active Problem List   Diagnosis Date Noted  . Yeast infection 03/19/2019  . Urinary incontinence 02/13/2019  . SIRS (systemic inflammatory response syndrome) ([place]) 02/11/2019  . Tachycardia 01/13/2019  . Neutropenic fever ([place]) 12/30/2018  . Anemia due to antineoplastic chemotherapy 12/24/2018  . Mild  depression ([place]) 12/03/2018  .  Peripheral neuropathy due to chemotherapy ([place]) 11/20/2018  . Family history of breast cancer   . Family history of lung cancer   . Obesity 10/14/2018  . Endometrial cancer ([place]) 10/01/2018  . Primary localized osteoarthritis of right knee 07/30/2016  . Anxiety   . Primary localized osteoarthrosis of the knee, right   . Urinary urgency     Jule Ser, PT 05/21/2019, 10:17 AM  [facility] [address], [place], [place], [address] Phone: [phone]   Fax:  [phone]  Name: RIYANNA RADKA MRN: [id number] Date of Birth: 06-Sep-1945

## 2019-05-25 ENCOUNTER — Encounter: Payer: Self-pay | Admitting: Physical Therapy

## 2019-05-25 ENCOUNTER — Other Ambulatory Visit: Payer: Self-pay

## 2019-05-25 ENCOUNTER — Ambulatory Visit: Payer: PPO | Admitting: Physical Therapy

## 2019-05-25 DIAGNOSIS — R262 Difficulty in walking, not elsewhere classified: Secondary | ICD-10-CM

## 2019-05-25 DIAGNOSIS — M6281 Muscle weakness (generalized): Secondary | ICD-10-CM | POA: Diagnosis not present

## 2019-05-25 DIAGNOSIS — R252 Cramp and spasm: Secondary | ICD-10-CM

## 2019-05-25 NOTE — Therapy (Signed)
[facility] [address] [place], [place], [address] Phone: [phone]   Fax:  [phone]  Physical Therapy Treatment  Patient Details  Name: Zoe Kim MRN: [id number] Date of Birth: [birth year] Referring Provider (PT): Heath Lark, MD   Encounter Date: 05/25/2019  PT End of Session - 05/25/19 0925    Visit Number  8    Date for PT Re-Evaluation  06/08/19    PT Start Time  0925    PT Stop Time  1005    PT Time Calculation (min)  40 min    Activity Tolerance  Patient tolerated treatment well;Patient limited by fatigue    Behavior During Therapy  [facility] for tasks assessed/performed       Past Medical History:  Diagnosis Date  . Anxiety   . Arthritis   . Depression   . Endometrial ca ([place])   . Family history of breast cancer   . Family history of lung cancer   . Heart murmur    "years ago"  . Insomnia   . Pneumonia    "long time ago"  . Primary localized osteoarthrosis of the knee, right   . Urinary urgency     Past Surgical History:  Procedure Laterality Date  . COLONOSCOPY    . EYE SURGERY Bilateral    cataract with lens  . IR IMAGING GUIDED PORT INSERTION  11/06/2018  . IR REMOVAL TUN ACCESS W/ PORT W/O FL MOD SED  02/13/2019  . ROBOTIC ASSISTED TOTAL HYSTERECTOMY WITH BILATERAL SALPINGO OOPHERECTOMY N/A 10/14/2018   Procedure: ROBOTIC ASSISTED TOTAL HYSTERECTOMY WITH BILATERAL SALPINGO OOPHORECTOMY;  Surgeon: Everitt Amber, MD;  [facility];  Service: Gynecology;  Laterality: N/A;  . SENTINEL NODE BIOPSY N/A 10/14/2018   Procedure: SENTINEL LYMPH NODE BIOPSY;  Surgeon: Everitt Amber, MD;  [facility];  Service: Gynecology;  Laterality: N/A;  . TONSILLECTOMY    . TOOTH EXTRACTION Right 08/27/2017  . TOTAL KNEE ARTHROPLASTY Right 07/30/2016   Procedure: TOTAL KNEE ARTHROPLASTY;  Surgeon: Elsie Saas, MD;  Location: [place];  Service: Orthopedics;  Laterality: Right;  . VEIN LIGATION AND  STRIPPING      There were no vitals filed for this visit.  Subjective Assessment - 05/25/19 0932    Subjective  My back starts cramping around mid day and feels so sore at night that my husband has to take care of the horses.  I haven't had to get up at night to go to the bathroom the past couple of nights.    Patient Stated Goals  leakage, be able to walk up the hill    Currently in Pain?  No/denies                       [facility] Adult PT Treatment/Exercise - 05/25/19 0001      Lumbar Exercises: Aerobic   Nustep  seat 6; L1 x 10 min - PT present for status update   moist heat behind the back     Lumbar Exercises: Machines for Strengthening   Leg Press  seat 5; bilat 30lb x 10; 45 lb x 20      Lumbar Exercises: Standing   Heel Raises Limitations  rocker board rocking heel to toe - 30x    Other Standing Lumbar Exercises  side step with red band - 2x10 each way      Lumbar Exercises: Seated   Long Arc Quad on Chair  Strengthening;Right;Left;10 reps;Weights  ball squeeze   LAQ on Chair Weights (lbs)  2   ball squeeze   Sit to Stand  10 reps   engage core and holding ball   Other Seated Lumbar Exercises  thoracic extension ball behind back               PT Short Term Goals - 05/18/19 0933      PT SHORT TERM GOAL #1   Title  ind with initial HEP    Status  Achieved      PT SHORT TERM GOAL #2   Title  Pt will report she is able to walk up to 300 ft without stopping to rest    Baseline  able to walk a couple of blocks outside without stopping    Status  Achieved        PT Long Term Goals - 04/14/19 0836      PT LONG TERM GOAL #1   Title  Pt will be ind with advanced HEP    Time  8    Period  Weeks    Status  New    Target Date  06/08/19      PT LONG TERM GOAL #2   Title  pt will report leakage 1x/day at most and able to hold bladder for up to 2 hours when standing/walking    Baseline  6x and less than one hour when standing/walking    Time   8    Period  Weeks    Status  New    Target Date  06/08/19      PT LONG TERM GOAL #3   Title  Pt will be able to perform single leg stand x10 sec bilaterally for reduced risk of falls    Time  8    Period  Weeks    Status  New    Target Date  06/08/19      PT LONG TERM GOAL #4   Title  pt will be able to perform 5x sit to stand in <13 sec for reduced risk of falls and improved strength    Time  8    Period  Weeks    Status  New    Target Date  06/08/19            Plan - 05/25/19 0935    Clinical Impression Statement  Pt tolerated exercises well and was able to progress difficulty level. Pt has instability in bilat ankles and peripheral neuropathy so she needs some mild CGA with side stepping.  Pt was able to increase to 2lb and increased standing exercises.  Pt continues to need skilled PT to achieve functional goals.    Comorbidities  hysterectomy, chemo, radiation, husband recently had cancer    PT Treatment/Interventions  ADLs/Self Care Home Management;Biofeedback;Cryotherapy;Electrical Stimulation;Moist Heat;Therapeutic activities;Therapeutic exercise;Manual techniques;Taping;Dry needling;Passive range of motion;Neuromuscular re-education    PT Home Exercise Plan  Access Code: [email or web address] and Agree with Plan of Care  Patient       Patient will benefit from skilled therapeutic intervention in order to improve the following deficits and impairments:  Abnormal gait, Pain, Increased fascial restricitons, Decreased strength, Difficulty walking, Decreased activity tolerance, Increased muscle spasms, Impaired sensation  Visit Diagnosis: Muscle weakness (generalized)  Difficulty in walking, not elsewhere classified  Cramp and spasm     Problem List Patient Active Problem List   Diagnosis Date Noted  . Yeast infection 03/19/2019  . Urinary incontinence 02/13/2019  .  SIRS (systemic inflammatory response syndrome) ([place]) 02/11/2019  . Tachycardia 01/13/2019  .  Neutropenic fever ([place]) 12/30/2018  . Anemia due to antineoplastic chemotherapy 12/24/2018  . Mild depression ([place]) 12/03/2018  . Peripheral neuropathy due to chemotherapy ([place]) 11/20/2018  . Family history of breast cancer   . Family history of lung cancer   . Obesity 10/14/2018  . Endometrial cancer ([place]) 10/01/2018  . Primary localized osteoarthritis of right knee 07/30/2016  . Anxiety   . Primary localized osteoarthrosis of the knee, right   . Urinary urgency     Jule Ser, PT 05/25/2019, 10:10 AM  [facility] [address] [place], [place], [address] Phone: [phone]   Fax:  [phone]  Name: JAMEIRA ENSER MRN: [id number] Date of Birth: 08/22/1945

## 2019-05-28 ENCOUNTER — Ambulatory Visit: Payer: PPO | Admitting: Physical Therapy

## 2019-06-01 ENCOUNTER — Other Ambulatory Visit: Payer: Self-pay

## 2019-06-01 ENCOUNTER — Encounter: Payer: Self-pay | Admitting: Physical Therapy

## 2019-06-01 ENCOUNTER — Ambulatory Visit: Payer: PPO | Admitting: Physical Therapy

## 2019-06-01 DIAGNOSIS — M6281 Muscle weakness (generalized): Secondary | ICD-10-CM

## 2019-06-01 DIAGNOSIS — R252 Cramp and spasm: Secondary | ICD-10-CM

## 2019-06-01 DIAGNOSIS — R262 Difficulty in walking, not elsewhere classified: Secondary | ICD-10-CM

## 2019-06-01 NOTE — Therapy (Signed)
[facility] [address], [place], [place], [address] Phone: [phone]   Fax:  [phone]  Physical Therapy Treatment  Patient Details  Name: Zoe Kim MRN: [id number] Date of Birth: [birth year] Referring Provider (PT): Heath Lark, MD   Encounter Date: 06/01/2019  PT End of Session - 06/01/19 0935    Visit Number  9    Number of Visits  10    Date for PT Re-Evaluation  06/08/19    PT Start Time  0929    PT Stop Time  1008    PT Time Calculation (min)  39 min    Activity Tolerance  Patient tolerated treatment well;Patient limited by fatigue    Behavior During Therapy  [facility] for tasks assessed/performed       Past Medical History:  Diagnosis Date  . Anxiety   . Arthritis   . Depression   . Endometrial ca ([place])   . Family history of breast cancer   . Family history of lung cancer   . Heart murmur    "years ago"  . Insomnia   . Pneumonia    "long time ago"  . Primary localized osteoarthrosis of the knee, right   . Urinary urgency     Past Surgical History:  Procedure Laterality Date  . COLONOSCOPY    . EYE SURGERY Bilateral    cataract with lens  . IR IMAGING GUIDED PORT INSERTION  11/06/2018  . IR REMOVAL TUN ACCESS W/ PORT W/O FL MOD SED  02/13/2019  . ROBOTIC ASSISTED TOTAL HYSTERECTOMY WITH BILATERAL SALPINGO OOPHERECTOMY N/A 10/14/2018   Procedure: ROBOTIC ASSISTED TOTAL HYSTERECTOMY WITH BILATERAL SALPINGO OOPHORECTOMY;  Surgeon: Everitt Amber, MD;  [facility];  Service: Gynecology;  Laterality: N/A;  . SENTINEL NODE BIOPSY N/A 10/14/2018   Procedure: SENTINEL LYMPH NODE BIOPSY;  Surgeon: Everitt Amber, MD;  [facility];  Service: Gynecology;  Laterality: N/A;  . TONSILLECTOMY    . TOOTH EXTRACTION Right 08/27/2017  . TOTAL KNEE ARTHROPLASTY Right 07/30/2016   Procedure: TOTAL KNEE ARTHROPLASTY;  Surgeon: Elsie Saas, MD;  Location: [place];  Service: Orthopedics;  Laterality: Right;   . VEIN LIGATION AND STRIPPING      There were no vitals filed for this visit.  Subjective Assessment - 06/01/19 0937    Subjective  Pt states Christmas day was able to walk down to the barn and back up up.    Patient Stated Goals  leakage, be able to walk up the hill    Currently in Pain?  No/denies                       [facility] Adult PT Treatment/Exercise - 06/01/19 0001      Lumbar Exercises: Stretches   Gastroc Stretch  Right;Left;60 seconds      Lumbar Exercises: Aerobic   Nustep  seat 6; L1 x 10 min - PT present for status update   .68 miles in 10 minutes     Lumbar Exercises: Machines for Strengthening   Leg Press  seat 5; bilat 50 lb 2 x 25      Lumbar Exercises: Standing   Row  Strengthening;Power tower;Both;10 reps    Row Limitations  20 lb    Shoulder Extension  Strengthening;Power Tower;Both;15 reps    Shoulder Extension Limitations  15 lb    Other Standing Lumbar Exercises  walking with sports cord - 15 lb -     Other  Standing Lumbar Exercises  sliders 3 ways with core and pelvic control               PT Short Term Goals - 05/18/19 0933      PT SHORT TERM GOAL #1   Title  ind with initial HEP    Status  Achieved      PT SHORT TERM GOAL #2   Title  Pt will report she is able to walk up to 300 ft without stopping to rest    Baseline  able to walk a couple of blocks outside without stopping    Status  Achieved        PT Long Term Goals - 04/14/19 0836      PT LONG TERM GOAL #1   Title  Pt will be ind with advanced HEP    Time  8    Period  Weeks    Status  New    Target Date  06/08/19      PT LONG TERM GOAL #2   Title  pt will report leakage 1x/day at most and able to hold bladder for up to 2 hours when standing/walking    Baseline  6x and less than one hour when standing/walking    Time  8    Period  Weeks    Status  New    Target Date  06/08/19      PT LONG TERM GOAL #3   Title  Pt will be able to perform single leg  stand x10 sec bilaterally for reduced risk of falls    Time  8    Period  Weeks    Status  New    Target Date  06/08/19      PT LONG TERM GOAL #4   Title  pt will be able to perform 5x sit to stand in <13 sec for reduced risk of falls and improved strength    Time  8    Period  Weeks    Status  New    Target Date  06/08/19            Plan - 06/01/19 1011    Clinical Impression Statement  Pt is progressing well and was able to walk up the hill from the barn this last week.  Pt added resistance and reps to exericses today.  She also tolerated all standing exercises.  Pt will benefit from skilled PT to continue to porgress strength for full return to functional activities.    PT Treatment/Interventions  ADLs/Self Care Home Management;Biofeedback;Cryotherapy;Electrical Stimulation;Moist Heat;Therapeutic activities;Therapeutic exercise;Manual techniques;Taping;Dry needling;Passive range of motion;Neuromuscular re-education    PT Next Visit Plan  continue with nustep for endurance, squat progressions, strength and endurance, balance    PT Home Exercise Plan  Access Code: [email or web address]    Consulted and Agree with Plan of Care  Patient       Patient will benefit from skilled therapeutic intervention in order to improve the following deficits and impairments:  Abnormal gait, Pain, Increased fascial restricitons, Decreased strength, Difficulty walking, Decreased activity tolerance, Increased muscle spasms, Impaired sensation  Visit Diagnosis: Muscle weakness (generalized)  Difficulty in walking, not elsewhere classified  Cramp and spasm     Problem List Patient Active Problem List   Diagnosis Date Noted  . Yeast infection 03/19/2019  . Urinary incontinence 02/13/2019  . SIRS (systemic inflammatory response syndrome) ([place]) 02/11/2019  . Tachycardia 01/13/2019  . Neutropenic fever ([place]) 12/30/2018  . Anemia due to antineoplastic  chemotherapy 12/24/2018  . Mild depression ([place])  12/03/2018  . Peripheral neuropathy due to chemotherapy ([place]) 11/20/2018  . Family history of breast cancer   . Family history of lung cancer   . Obesity 10/14/2018  . Endometrial cancer ([place]) 10/01/2018  . Primary localized osteoarthritis of right knee 07/30/2016  . Anxiety   . Primary localized osteoarthrosis of the knee, right   . Urinary urgency     Jule Ser, PT 06/01/2019, 10:14 AM  [facility] [address] [place], [place], [address] Phone: [phone]   Fax:  [phone]  Name: LEILANNI LIGGINS MRN: [id number] Date of Birth: 03/11/1946

## 2019-06-04 ENCOUNTER — Ambulatory Visit: Payer: PPO | Admitting: Physical Therapy

## 2019-06-04 ENCOUNTER — Other Ambulatory Visit: Payer: Self-pay

## 2019-06-04 ENCOUNTER — Encounter: Payer: Self-pay | Admitting: Physical Therapy

## 2019-06-04 DIAGNOSIS — M6281 Muscle weakness (generalized): Secondary | ICD-10-CM

## 2019-06-04 DIAGNOSIS — R252 Cramp and spasm: Secondary | ICD-10-CM

## 2019-06-04 DIAGNOSIS — R262 Difficulty in walking, not elsewhere classified: Secondary | ICD-10-CM

## 2019-06-04 NOTE — Patient Instructions (Addendum)
PROTOCOL FOR DILATORS   1. Wash dilator with soap and water prior to insertion.    2. Lay on your back reclined. Knees are to be up and apart while on your bed or in the bathtub with warm water.   3. Lubricate the end of the dilator with a water-soluble lubricant.  4. Separate the labia.   5. Tense the pelvic floor muscles than relax; while relaxing, slide lubricated dilator into the vagina.    6. Tense muscles again while holding the dilator so it does not get pushed out; relax and slide it in a little further.   7. Try blowing out as if filling a balloon; this may relax the muscles and allow penetration.  Repeat blowing out to insert dilator further.  8. Keep dilator in for 10 minutes if tolerate, with the pelvic floor muscles relaxed to further stretch the canal.   9. Never force the dilator into the canal.  10. 1-2 times per day Updates to [facility]

## 2019-06-04 NOTE — Therapy (Addendum)
[facility] [address], [place], [place], [address] Phone: [phone]   Fax:  [phone]  Physical Therapy Treatment Progress Note Reporting Period 04/13/19 to 06/04/19   See note below for Objective Data and Assessment of Progress/Goals.      Patient Details  Name: TREINA ARSCOTT MRN: [id number] Date of Birth: Jul 13, 1945 Referring Provider (PT): Heath Lark, MD   Encounter Date: 06/04/2019  PT End of Session - 06/04/19 2446    Visit Number  10    Number of Visits  20    Date for PT Re-Evaluation  06/08/19    PT Start Time  0923    PT Stop Time  1005    PT Time Calculation (min)  42 min    Activity Tolerance  Patient tolerated treatment well    Behavior During Therapy  [facility] for tasks assessed/performed       Past Medical History:  Diagnosis Date  . Anxiety   . Arthritis   . Depression   . Endometrial ca ([place])   . Family history of breast cancer   . Family history of lung cancer   . Heart murmur    "years ago"  . Insomnia   . Pneumonia    "long time ago"  . Primary localized osteoarthrosis of the knee, right   . Urinary urgency     Past Surgical History:  Procedure Laterality Date  . COLONOSCOPY    . EYE SURGERY Bilateral    cataract with lens  . IR IMAGING GUIDED PORT INSERTION  11/06/2018  . IR REMOVAL TUN ACCESS W/ PORT W/O FL MOD SED  02/13/2019  . ROBOTIC ASSISTED TOTAL HYSTERECTOMY WITH BILATERAL SALPINGO OOPHERECTOMY N/A 10/14/2018   Procedure: ROBOTIC ASSISTED TOTAL HYSTERECTOMY WITH BILATERAL SALPINGO OOPHORECTOMY;  Surgeon: Everitt Amber, MD;  Location: [facility];  Service: Gynecology;  Laterality: N/A;  . SENTINEL NODE BIOPSY N/A 10/14/2018   Procedure: SENTINEL LYMPH NODE BIOPSY;  Surgeon: Everitt Amber, MD;  Location: [facility];  Service: Gynecology;  Laterality: N/A;  . TONSILLECTOMY    . TOOTH EXTRACTION Right 08/27/2017  . TOTAL KNEE ARTHROPLASTY Right 07/30/2016   Procedure: TOTAL  KNEE ARTHROPLASTY;  Surgeon: Elsie Saas, MD;  Location: [place];  Service: Orthopedics;  Laterality: Right;  . VEIN LIGATION AND STRIPPING      There were no vitals filed for this visit.  Subjective Assessment - 06/04/19 0932    Subjective  Pt states the leakage was a little more the last few days since drinking more sprite, so she is going to stop that.    Currently in Pain?  No/denies         [facility] PT Assessment - 06/04/19 0001      6 minute walk test results    Aerobic Endurance Distance Walked  1177      Standardized Balance Assessment   Standardized Balance Assessment  Five Times Sit to Stand    Five times sit to stand comments   9   16, 7, 5 - 9 sec average                  [facility] Adult PT Treatment/Exercise - 06/04/19 0001      Self-Care   Other Self-Care Comments   educated and performed self massag to back with tennis ball      Lumbar Exercises: Aerobic   Nustep  seat 6; L3 x 6 min - PT present for status update  Lumbar Exercises: Standing   Other Standing Lumbar Exercises  single leg standing 2-4 sec without UE support x 5 trials each side      Lumbar Exercises: Seated   Other Seated Lumbar Exercises  lumbar and thoracic stretch ball roll out             PT Education - 06/04/19 1009    Education Details  [id number]    Person(s) Educated  Patient    Methods  Explanation;Demonstration;Handout;Verbal cues    Comprehension  Verbalized understanding;Returned demonstration       PT Short Term Goals - 06/04/19 0933      PT SHORT TERM GOAL #1   Title  ind with initial HEP    Status  Achieved      PT SHORT TERM GOAL #2   Title  Pt will report she is able to walk up to 300 ft without stopping to rest    Status  Achieved      PT SHORT TERM GOAL #3   Title  Pt will report using 3 diapers/ day due to decreased leakage    Baseline  2-3/day    Status  Achieved      PT SHORT TERM GOAL #4   Title  Pt will be able to perfrom 6 min walk test  due to improved endurance    Baseline  1177 ft - done without stopping    Status  Achieved        PT Long Term Goals - 06/04/19 0949      PT LONG TERM GOAL #1   Title  Pt will be ind with advanced HEP    Status  Achieved      PT LONG TERM GOAL #2   Title  pt will report leakage 1x/day at most and able to hold bladder for up to 2 hours when standing/walking    Baseline  has small amount several times per day    Status  Partially Met      PT LONG TERM GOAL #3   Title  Pt will be able to perform single leg stand x10 sec bilaterally for reduced risk of falls    Baseline  2-4 sec    Status  Partially Met      PT LONG TERM GOAL #4   Title  pt will be able to perform 5x sit to stand in <13 sec for reduced risk of falls and improved strength    Baseline  9 sec    Status  Achieved            Plan - 06/04/19 1009    Clinical Impression Statement  Pt has met or partially met most goals.  She is making good progress and very independent withher HEP at this point.  Pt is expected to be able to continue to progress towards remaining goals on her own.  She will discharge with HEP today    PT Treatment/Interventions  ADLs/Self Care Home Management;Biofeedback;Cryotherapy;Electrical Stimulation;Moist Heat;Therapeutic activities;Therapeutic exercise;Manual techniques;Taping;Dry needling;Passive range of motion;Neuromuscular re-education    PT Next Visit Plan  d/c today    PT Home Exercise Plan  Access Code: [email or web address]    DHRCBULAG and Agree with Plan of Care  Patient       Patient will benefit from skilled therapeutic intervention in order to improve the following deficits and impairments:  Abnormal gait, Pain, Increased fascial restricitons, Decreased strength, Difficulty walking, Decreased activity tolerance, Increased muscle spasms, Impaired sensation  Visit Diagnosis: Muscle weakness (  generalized)  Difficulty in walking, not elsewhere classified  Cramp and spasm     Problem  List Patient Active Problem List   Diagnosis Date Noted  . Yeast infection 03/19/2019  . Urinary incontinence 02/13/2019  . SIRS (systemic inflammatory response syndrome) ([place]) 02/11/2019  . Tachycardia 01/13/2019  . Neutropenic fever ([place]) 12/30/2018  . Anemia due to antineoplastic chemotherapy 12/24/2018  . Mild depression ([place]) 12/03/2018  . Peripheral neuropathy due to chemotherapy ([place]) 11/20/2018  . Family history of breast cancer   . Family history of lung cancer   . Obesity 10/14/2018  . Endometrial cancer ([place]) 10/01/2018  . Primary localized osteoarthritis of right knee 07/30/2016  . Anxiety   . Primary localized osteoarthrosis of the knee, right   . Urinary urgency     Jule Ser, PT 06/04/2019, 11:01 AM  [facility] [address], [place], [place], [address] Phone: [phone]   Fax:  [phone]  Name: LARAY RIVKIN MRN: [id number] Date of Birth: 23-Apr-1946  PHYSICAL THERAPY DISCHARGE SUMMARY  Visits from Start of Care: 10  Current functional level related to goals / functional outcomes: See above   Remaining deficits: See above details   Education / Equipment: HEP  Plan: Patient agrees to discharge.  Patient goals were partially met. Patient is being discharged due to being pleased with the current functional level.  ?????    [employer], PT 06/04/19 8:17 PM

## 2019-06-09 DIAGNOSIS — M9903 Segmental and somatic dysfunction of lumbar region: Secondary | ICD-10-CM | POA: Diagnosis not present

## 2019-06-09 DIAGNOSIS — M47816 Spondylosis without myelopathy or radiculopathy, lumbar region: Secondary | ICD-10-CM | POA: Diagnosis not present

## 2019-06-10 DIAGNOSIS — M47816 Spondylosis without myelopathy or radiculopathy, lumbar region: Secondary | ICD-10-CM | POA: Diagnosis not present

## 2019-06-10 DIAGNOSIS — M9903 Segmental and somatic dysfunction of lumbar region: Secondary | ICD-10-CM | POA: Diagnosis not present

## 2019-06-15 ENCOUNTER — Inpatient Hospital Stay: Payer: PPO | Attending: Hematology and Oncology | Admitting: Gynecologic Oncology

## 2019-06-15 ENCOUNTER — Encounter: Payer: Self-pay | Admitting: Gynecologic Oncology

## 2019-06-15 ENCOUNTER — Other Ambulatory Visit: Payer: Self-pay

## 2019-06-15 VITALS — BP 151/83 | HR 100 | Temp 97.8°F | Resp 18 | Ht 64.0 in | Wt 171.6 lb

## 2019-06-15 DIAGNOSIS — Z90722 Acquired absence of ovaries, bilateral: Secondary | ICD-10-CM | POA: Diagnosis not present

## 2019-06-15 DIAGNOSIS — F419 Anxiety disorder, unspecified: Secondary | ICD-10-CM | POA: Diagnosis not present

## 2019-06-15 DIAGNOSIS — C541 Malignant neoplasm of endometrium: Secondary | ICD-10-CM | POA: Insufficient documentation

## 2019-06-15 DIAGNOSIS — R011 Cardiac murmur, unspecified: Secondary | ICD-10-CM | POA: Insufficient documentation

## 2019-06-15 DIAGNOSIS — Z6832 Body mass index (BMI) 32.0-32.9, adult: Secondary | ICD-10-CM | POA: Insufficient documentation

## 2019-06-15 DIAGNOSIS — Z923 Personal history of irradiation: Secondary | ICD-10-CM | POA: Insufficient documentation

## 2019-06-15 DIAGNOSIS — F329 Major depressive disorder, single episode, unspecified: Secondary | ICD-10-CM | POA: Insufficient documentation

## 2019-06-15 DIAGNOSIS — Z9221 Personal history of antineoplastic chemotherapy: Secondary | ICD-10-CM | POA: Diagnosis not present

## 2019-06-15 DIAGNOSIS — Z90710 Acquired absence of both cervix and uterus: Secondary | ICD-10-CM | POA: Insufficient documentation

## 2019-06-15 DIAGNOSIS — E663 Overweight: Secondary | ICD-10-CM | POA: Insufficient documentation

## 2019-06-15 NOTE — Progress Notes (Signed)
Follow-up Note: Gyn-Onc  Consult was requested by Dr. [name] for the evaluation of Zoe Kim 74 y.o. female  CC:  Chief Complaint  Patient presents with  . Endometrial cancer [facility])    Assessment/Plan:  Zoe Kim  is a 74 y.o.  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   s/p 6 cycles of carboplatin paclitaxel chemotherapy and vaginal brachii therapy completed 03/04/19.   Complete clinical response.   Recommend continued 3 monthly surveillance until September, 2022. I will see her back in July, Dr Sondra Come will see her in April, 2021.   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  The patient reports 6 months or maybe longer of intermittent vaginal spotting.  She was seen by Dr. [name] on September 19, 2018 who performed a Pap smear which revealed atypical glandular cells and an endocervical biopsy which revealed a benign endocervical polyp, and endometrial biopsy which revealed high-grade serous endometrial adenocarcinoma and adenocarcinoma FIGO grade 2.  The patient has a personal history for being nulliparous with the exception of 1 trimester miscarriage.  She denies hypertension hyperlipidemia diabetes mellitus.  She is overweight with a BMI of 32 kg/m.  Had a knee replacement but no prior abdominal surgeries.  She has no history of anticoagulant use.  Preoperative CT imaging showed no concerning evidence for metastatic disease.  On Oct 14, 2018 she underwent a robotic assisted total hysterectomy with BSO and sentinel lymph node biopsy.  Intraoperative findings were significant for an 8 cm arcuate uterus with normal-appearing tubes and ovaries and a narrow vaginal introitus.  There was no gross extrauterine disease identified intraoperatively.  Final pathology revealed a FIGO grade  3 mixed endometrioid and serous carcinoma with invasion of less than half of the myometrium (6 of 17 mm).  Lymphovascular space invasion was present.  The cervical stroma was not involved.  The adnexa were free of disease.  There was 1 macro metastasis in 1 of 3 sentinel lymph nodes (this was occurring in the right external iliac node).  The tumor was MMR normal and Zoe S, and negative for HER-2.  She was determined to have stage III C1 high-grade serous endometrial cancer and was dispositioned to receive adjuvant therapy with chemotherapy and vaginal brachii therapy in accordance with NCCN guidelines.  Interval Hx:  She went on to receive 6 cycles of carboplatin and paclitaxel chemotherapy between 11/10/18 and 04/27/19 in the addition of vaginal brachytherapy (30 Gy in 5 fractions, dates of treatment 01/15/19 to 03/04/19).  She has lower extremity neuropathy which is predominantly numbness rather than pain. 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   Current Meds:  No outpatient encounter medications on file as of 06/15/2019.   No facility-administered encounter medications on file as of 06/15/2019.    Allergy:  Allergies  Allergen Reactions  . Sulfa Antibiotics     UNSPECIFIED REACTION     Social Hx:   Social History   Socioeconomic History  . Marital status: Married    Spouse name: Not on file  . Number of children: 0  . Years of education: Not on file  . Highest education level: Bachelor's degree (e.g., BA, AB, BS)  Occupational History  . Occupation: retired Pharmacist, hospital  Tobacco Use  . Smoking status: Never Smoker  . Smokeless  tobacco: Never Used  . Tobacco comment: smoked 2 weeks in college  Substance and Sexual Activity  . Alcohol use: No  . Drug use: No  . Sexual activity: Not Currently  Other Topics Concern  . Not on file  Social History Narrative  . Not on file   Social Determinants of Health   Financial Resource Strain:   . Difficulty of Paying  Living Expenses: Not on file  Food Insecurity:   . Worried About Charity fundraiser in the Last Year: Not on file  . Ran Out of Food in the Last Year: Not on file  Transportation Needs:   . Lack of Transportation (Medical): Not on file  . Lack of Transportation (Non-Medical): Not on file  Physical Activity:   . Days of Exercise per Week: Not on file  . Minutes of Exercise per Session: Not on file  Stress:   . Feeling of Stress : Not on file  Social Connections:   . Frequency of Communication with Friends and Family: Not on file  . Frequency of Social Gatherings with Friends and Family: Not on file  . Attends Religious Services: Not on file  . Active Member of Clubs or Organizations: Not on file  . Attends Archivist Meetings: Not on file  . Marital Status: Not on file  Intimate Partner Violence:   . Fear of Current or Ex-Partner: Not on file  . Emotionally Abused: Not on file  . Physically Abused: Not on file  . Sexually Abused: Not on file    Past Surgical Hx:  Past Surgical History:  Procedure Laterality Date  . COLONOSCOPY    . EYE SURGERY Bilateral    cataract with lens  . IR IMAGING GUIDED PORT INSERTION  11/06/2018  . IR REMOVAL TUN ACCESS W/ PORT W/O FL MOD SED  02/13/2019  . ROBOTIC ASSISTED TOTAL HYSTERECTOMY WITH BILATERAL SALPINGO OOPHERECTOMY N/A 10/14/2018   Procedure: ROBOTIC ASSISTED TOTAL HYSTERECTOMY WITH BILATERAL SALPINGO OOPHORECTOMY;  Surgeon: Everitt Amber, MD;  [facility];  Service: Gynecology;  Laterality: N/A;  . SENTINEL NODE BIOPSY N/A 10/14/2018   Procedure: SENTINEL LYMPH NODE BIOPSY;  Surgeon: Everitt Amber, MD;  [facility];  Service: Gynecology;  Laterality: N/A;  . TONSILLECTOMY    . TOOTH EXTRACTION Right 08/27/2017  . TOTAL KNEE ARTHROPLASTY Right 07/30/2016   Procedure: TOTAL KNEE ARTHROPLASTY;  Surgeon: Elsie Saas, MD;  Location: [place];  Service: Orthopedics;  Laterality: Right;  . VEIN LIGATION AND STRIPPING      Past  Medical Hx:  Past Medical History:  Diagnosis Date  . Anxiety   . Arthritis   . Depression   . Endometrial ca ([place])   . Family history of breast cancer   . Family history of lung cancer   . Heart murmur    "years ago"  . Insomnia   . Pneumonia    "long time ago"  . Primary localized osteoarthrosis of the knee, right   . Urinary urgency     Past Gynecological History:  See HPI No LMP recorded. Patient is postmenopausal.  Family Hx:  Family History  Problem Relation Age of Onset  . Heart disease Mother   . COPD Mother   . Cancer Mother        uterine (possibly, pt unsure)  . Breast cancer Mother        dx in 75s  . Heart disease Father   . COPD Brother   . Heart disease  Brother   . Cancer Brother 53       lung ca, smoker  . Cancer Maternal Grandmother        breast dx early 71s  . Stroke Maternal Grandfather     Review of Systems:  Constitutional  Feels well,    ENT Normal appearing ears and nares bilaterally Skin/Breast  No rash, sores, jaundice, itching, dryness Cardiovascular  No chest pain, shortness of breath, or edema  Pulmonary  No cough or wheeze.  Gastro Intestinal  No nausea, vomitting, or diarrhoea. No bright red blood per rectum, no abdominal pain, change in bowel movement, or constipation.  Genito Urinary  No frequency, urgency, dysuria, no bleeding Musculo Skeletal  No myalgia, arthralgia, joint swelling or pain  Neurologic  No weakness, numbness, change in gait,  Psychology  No depression, anxiety, insomnia.   Vitals:  Blood pressure (!) 151/83, pulse 100, temperature 97.8 F (36.6 C), temperature source Temporal, resp. rate 18, height '5\' 4"'$  (1.626 m), weight 171 lb 9.6 oz (77.8 kg), SpO2 100 %.  Physical Exam: WD in NAD Neck  Supple NROM, without any enlargements.  Lymph Node Survey No cervical supraclavicular or inguinal adenopathy Cardiovascular  Pulse normal rate, regularity and rhythm. S1 and S2 normal.  Lungs  Clear to  auscultation bilateraly, without wheezes/crackles/rhonchi. Good air movement.  Skin  No rash/lesions/breakdown  Psychiatry  Alert and oriented to person, place, and time  Abdomen  Normoactive bowel sounds, abdomen soft, non-tender and overweight without evidence of hernia. Soft incisions Back No CVA tenderness Genito Urinary  Vaginal cuff in tact with no lesions or masses. Smooth, shortened, no recurrence Rectal  deferred Extremities  No bilateral cyanosis, clubbing or edema.  Thereasa Solo, MD  06/15/2019, 1:59 PM

## 2019-06-15 NOTE — Patient Instructions (Signed)
Please notify Dr Denman George at phone number [phone] if you notice vaginal bleeding, new pelvic or abdominal pains, bloating, feeling full easy, or a change in bladder or bowel function.   Please follow-up with Dr Clabe Seal office in April, 2021 and have their office contact Dr Serita Grit office (at [phone]) after your appointment with him in April to request an appointment with her for July, 2021.

## 2019-06-17 DIAGNOSIS — M9903 Segmental and somatic dysfunction of lumbar region: Secondary | ICD-10-CM | POA: Diagnosis not present

## 2019-06-17 DIAGNOSIS — M47816 Spondylosis without myelopathy or radiculopathy, lumbar region: Secondary | ICD-10-CM | POA: Diagnosis not present

## 2019-06-22 DIAGNOSIS — M9903 Segmental and somatic dysfunction of lumbar region: Secondary | ICD-10-CM | POA: Diagnosis not present

## 2019-06-22 DIAGNOSIS — M47816 Spondylosis without myelopathy or radiculopathy, lumbar region: Secondary | ICD-10-CM | POA: Diagnosis not present

## 2019-06-23 DIAGNOSIS — N3941 Urge incontinence: Secondary | ICD-10-CM | POA: Diagnosis not present

## 2019-06-23 DIAGNOSIS — Z79899 Other long term (current) drug therapy: Secondary | ICD-10-CM | POA: Diagnosis not present

## 2019-06-23 DIAGNOSIS — E2839 Other primary ovarian failure: Secondary | ICD-10-CM | POA: Diagnosis not present

## 2019-06-23 DIAGNOSIS — Z0001 Encounter for general adult medical examination with abnormal findings: Secondary | ICD-10-CM | POA: Diagnosis not present

## 2019-06-23 DIAGNOSIS — G629 Polyneuropathy, unspecified: Secondary | ICD-10-CM | POA: Diagnosis not present

## 2019-06-23 DIAGNOSIS — F321 Major depressive disorder, single episode, moderate: Secondary | ICD-10-CM | POA: Diagnosis not present

## 2019-06-24 DIAGNOSIS — M9903 Segmental and somatic dysfunction of lumbar region: Secondary | ICD-10-CM | POA: Diagnosis not present

## 2019-06-24 DIAGNOSIS — M47816 Spondylosis without myelopathy or radiculopathy, lumbar region: Secondary | ICD-10-CM | POA: Diagnosis not present

## 2019-06-30 ENCOUNTER — Other Ambulatory Visit: Payer: Self-pay | Admitting: Family Medicine

## 2019-06-30 DIAGNOSIS — E2839 Other primary ovarian failure: Secondary | ICD-10-CM

## 2019-06-30 DIAGNOSIS — Z1231 Encounter for screening mammogram for malignant neoplasm of breast: Secondary | ICD-10-CM

## 2019-07-07 DIAGNOSIS — M9903 Segmental and somatic dysfunction of lumbar region: Secondary | ICD-10-CM | POA: Diagnosis not present

## 2019-07-07 DIAGNOSIS — M47816 Spondylosis without myelopathy or radiculopathy, lumbar region: Secondary | ICD-10-CM | POA: Diagnosis not present

## 2019-07-08 DIAGNOSIS — G629 Polyneuropathy, unspecified: Secondary | ICD-10-CM | POA: Diagnosis not present

## 2019-07-08 DIAGNOSIS — Z79899 Other long term (current) drug therapy: Secondary | ICD-10-CM | POA: Diagnosis not present

## 2019-07-08 DIAGNOSIS — Z0001 Encounter for general adult medical examination with abnormal findings: Secondary | ICD-10-CM | POA: Diagnosis not present

## 2019-07-08 DIAGNOSIS — D649 Anemia, unspecified: Secondary | ICD-10-CM | POA: Diagnosis not present

## 2019-07-08 DIAGNOSIS — Z136 Encounter for screening for cardiovascular disorders: Secondary | ICD-10-CM | POA: Diagnosis not present

## 2019-07-13 DIAGNOSIS — M9903 Segmental and somatic dysfunction of lumbar region: Secondary | ICD-10-CM | POA: Diagnosis not present

## 2019-07-13 DIAGNOSIS — M47816 Spondylosis without myelopathy or radiculopathy, lumbar region: Secondary | ICD-10-CM | POA: Diagnosis not present

## 2019-07-20 DIAGNOSIS — M47816 Spondylosis without myelopathy or radiculopathy, lumbar region: Secondary | ICD-10-CM | POA: Diagnosis not present

## 2019-07-20 DIAGNOSIS — M9903 Segmental and somatic dysfunction of lumbar region: Secondary | ICD-10-CM | POA: Diagnosis not present

## 2019-07-28 DIAGNOSIS — G629 Polyneuropathy, unspecified: Secondary | ICD-10-CM | POA: Diagnosis not present

## 2019-07-29 DIAGNOSIS — M47816 Spondylosis without myelopathy or radiculopathy, lumbar region: Secondary | ICD-10-CM | POA: Diagnosis not present

## 2019-07-29 DIAGNOSIS — M9903 Segmental and somatic dysfunction of lumbar region: Secondary | ICD-10-CM | POA: Diagnosis not present

## 2019-08-03 DIAGNOSIS — M47816 Spondylosis without myelopathy or radiculopathy, lumbar region: Secondary | ICD-10-CM | POA: Diagnosis not present

## 2019-08-03 DIAGNOSIS — M9903 Segmental and somatic dysfunction of lumbar region: Secondary | ICD-10-CM | POA: Diagnosis not present

## 2019-08-04 DIAGNOSIS — Z86010 Personal history of colonic polyps: Secondary | ICD-10-CM | POA: Diagnosis not present

## 2019-08-07 DIAGNOSIS — M546 Pain in thoracic spine: Secondary | ICD-10-CM | POA: Diagnosis not present

## 2019-08-13 ENCOUNTER — Ambulatory Visit: Payer: PPO | Attending: Internal Medicine

## 2019-08-13 DIAGNOSIS — Z23 Encounter for immunization: Secondary | ICD-10-CM

## 2019-08-13 NOTE — Progress Notes (Signed)
   Covid-19 Vaccination Clinic  Name:  Zoe Kim    MRN: [id number] DOB: August 05, 1945  08/13/2019  [name] was observed post Covid-19 immunization for 15 minutes without incident. She was provided with Vaccine Information Sheet and instruction to access the V-Safe system.   [name] was instructed to call 911 with any severe reactions post vaccine: Marland Kitchen Difficulty breathing  . Swelling of face and throat  . A fast heartbeat  . A bad rash all over body  . Dizziness and weakness   Immunizations Administered    Name Date Dose VIS Date Route   Moderna COVID-19 Vaccine 08/13/2019 10:38 AM 0.5 mL 05/05/2019 Intramuscular   Manufacturer: Moderna   Lot: [id number]   [place]:[id number]

## 2019-08-28 DIAGNOSIS — M546 Pain in thoracic spine: Secondary | ICD-10-CM | POA: Diagnosis not present

## 2019-09-07 ENCOUNTER — Ambulatory Visit
Admission: RE | Admit: 2019-09-07 | Discharge: 2019-09-07 | Disposition: A | Discharge status: Home / Self Care | Payer: PPO | Source: Ambulatory Visit | Attending: Radiation Oncology | Admitting: Radiation Oncology

## 2019-09-10 DIAGNOSIS — Z1159 Encounter for screening for other viral diseases: Secondary | ICD-10-CM | POA: Diagnosis not present

## 2019-09-14 ENCOUNTER — Other Ambulatory Visit: Payer: Self-pay

## 2019-09-14 ENCOUNTER — Ambulatory Visit
Admission: RE | Admit: 2019-09-14 | Discharge: 2019-09-14 | Disposition: A | Discharge status: Home / Self Care | Payer: PPO | Source: Ambulatory Visit | Attending: Radiation Oncology | Admitting: Radiation Oncology

## 2019-09-14 ENCOUNTER — Encounter: Payer: Self-pay | Admitting: Radiation Oncology

## 2019-09-14 VITALS — BP 152/80 | HR 98 | Temp 98.5°F | Resp 18 | Ht 64.0 in | Wt 170.4 lb

## 2019-09-14 DIAGNOSIS — Z8542 Personal history of malignant neoplasm of other parts of uterus: Secondary | ICD-10-CM | POA: Diagnosis not present

## 2019-09-14 DIAGNOSIS — K5900 Constipation, unspecified: Secondary | ICD-10-CM | POA: Diagnosis not present

## 2019-09-14 DIAGNOSIS — Z08 Encounter for follow-up examination after completed treatment for malignant neoplasm: Secondary | ICD-10-CM | POA: Diagnosis not present

## 2019-09-14 DIAGNOSIS — C541 Malignant neoplasm of endometrium: Secondary | ICD-10-CM | POA: Diagnosis not present

## 2019-09-14 DIAGNOSIS — Z923 Personal history of irradiation: Secondary | ICD-10-CM | POA: Insufficient documentation

## 2019-09-14 DIAGNOSIS — G629 Polyneuropathy, unspecified: Secondary | ICD-10-CM | POA: Diagnosis not present

## 2019-09-14 DIAGNOSIS — Z79899 Other long term (current) drug therapy: Secondary | ICD-10-CM | POA: Insufficient documentation

## 2019-09-14 NOTE — Progress Notes (Signed)
Pt presents today for f/u with Dr. Sondra Come. Pt reports pain as neuropathy in feet, rated 5/10. Denies dysuria/hematuria. Pt denies vaginal bleeding/discharge. Pt denies rectal bleeding. Pt reports occasional constipation that is relieved with laxatives. Denies abdominal bloating, N/V.   BP (!) 152/80 (BP Location: Left Arm, Patient Position: Sitting)   Pulse 98   Temp 98.5 F (36.9 C) (Temporal)   Resp 18   Ht 5\' 4"  (1.626 m)   Wt 170 lb 6 oz (77.3 kg)   SpO2 100%   BMI 29.24 kg/m   Wt Readings from Last 3 Encounters:  09/14/19 170 lb 6 oz (77.3 kg)  06/15/19 171 lb 9.6 oz (77.8 kg)  04/29/19 170 lb 9.6 oz (77.4 kg)   Loma Sousa, RN BSN

## 2019-09-14 NOTE — Patient Instructions (Signed)
Coronavirus (COVID-19) Are you at risk?  Are you at risk for the Coronavirus (COVID-19)?  To be considered HIGH RISK for Coronavirus (COVID-19), you have to meet the following criteria:  . Traveled to [place], [place], [place], [place] or [place]; or in the [place] to [place], [place], [place], or [place]; and have fever, cough, and shortness of breath within the last 2 weeks of travel OR . Been in close contact with a person diagnosed with COVID-19 within the last 2 weeks and have fever, cough, and shortness of breath . IF YOU DO NOT MEET THESE CRITERIA, YOU ARE CONSIDERED LOW RISK FOR COVID-19.  What to do if you are HIGH RISK for COVID-19?  . If you are having a medical emergency, call 911. . Seek medical care right away. Before you go to a doctor's office, urgent care or emergency department, call ahead and tell them about your recent travel, contact with someone diagnosed with COVID-19, and your symptoms. You should receive instructions from your physician's office regarding next steps of care.  . When you arrive at healthcare provider, tell the healthcare staff immediately you have returned from visiting [place], [place], [place], [place] or [place]; or traveled in the [place] to [place], [place], [place], or [place]; in the last two weeks or you have been in close contact with a person diagnosed with COVID-19 in the last 2 weeks.   . Tell the health care staff about your symptoms: fever, cough and shortness of breath. . After you have been seen by a medical provider, you will be either: o Tested for (COVID-19) and discharged home on quarantine except to seek medical care if symptoms worsen, and asked to  - Stay home and avoid contact with others until you get your results (4-5 days)  - Avoid travel on public transportation if possible (such as bus, train, or airplane) or o Sent to the Emergency Department by EMS for evaluation, COVID-19 testing, and possible  admission depending on your condition and test results.  What to do if you are LOW RISK for COVID-19?  Reduce your risk of any infection by using the same precautions used for avoiding the common cold or flu:  . Wash your hands often with soap and warm water for at least 20 seconds.  If soap and water are not readily available, use an alcohol-based hand sanitizer with at least 60% alcohol.  . If coughing or sneezing, cover your mouth and nose by coughing or sneezing into the elbow areas of your shirt or coat, into a tissue or into your sleeve (not your hands). . Avoid shaking hands with others and consider head nods or verbal greetings only. . Avoid touching your eyes, nose, or mouth with unwashed hands.  . Avoid close contact with people who are sick. . Avoid places or events with large numbers of people in one location, like concerts or sporting events. . Carefully consider travel plans you have or are making. . If you are planning any travel outside or inside the [place], visit the CDC's Travelers' Health webpage for the latest health notices. . If you have some symptoms but not all symptoms, continue to monitor at home and seek medical attention if your symptoms worsen. . If you are having a medical emergency, call 911.   ADDITIONAL HEALTHCARE OPTIONS FOR PATIENTS  [facility] / e-Visit: [email or web address]         [facility]: [phone]  [facility]: [phone]                   [facility]: [phone]   

## 2019-09-14 NOTE — Progress Notes (Signed)
Radiation Oncology         [phone] ________________________________  Name: Zoe Kim MRN: [id number]  Date: 09/14/2019  DOB: [birth year]  Follow-Up Visit Note  CC: Lujean Amel, MD  [name], MD    ICD-10-CM   1. 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  01/15/2019 through 03/04/2019 Site Technique Total Dose (Gy) Dose per Fx (Gy) Completed Fx Beam Energies  Pelvis: Vaginal Cylinder (2.5 cm) HDR, Brachii Therapy (3.0 cm length) 30/30 6 5/5 Iridium-192    Narrative:  The patient returns today for routine follow-up. Since her last visit, she had a CT scan on abdomen/pelvis on 04/27/2019. There was no evidence of metastatic disease or other acute findings within the abdomen or pelvis.  The patient followed-up with Dr. Alvy Bimler on 04/29/2019. At that time, it was noted that she had completed her treatment and most of the side effects were resolving.   The patient followed-up with Dr. Denman George on 06/15/2019, during which time she was said to have a complete clinical response and there was no evidence of recurrence.  On review of systems, she reports neuropathy in feet and occasional constipation that is relieved with laxatives. She denies dysuria, hematuria, vaginal discharge/bleeding, rectal bleeding, abdominal bloating, nausea, and vomiting.  The patient reports using her vaginal dilator.  She denies any spotting after using her dilator.  ALLERGIES:  is allergic to sulfa antibiotics.  Meds: Current Outpatient Medications  Medication Sig Dispense Refill  . Desvenlafaxine Succinate ER 25 MG TB24 Take 1 tablet by mouth daily.    Marland Kitchen gabapentin (NEURONTIN) 300 MG capsule Take 300 mg by mouth at bedtime.    . methocarbamol  (ROBAXIN) 750 MG tablet Take 750 mg by mouth every 8 (eight) hours as needed.    Marland Kitchen oxybutynin (DITROPAN) 5 MG tablet Take 5 mg by mouth 2 (two) times daily.    . polyethylene glycol-electrolytes (NULYTELY) 420 g solution Take 4,000 mLs by mouth as directed.     No current facility-administered medications for this encounter.    Physical Findings: The patient is in no acute distress. Patient is alert and oriented.  height is 5\' 4"  (1.626 m) and weight is 170 lb 6 oz (77.3 kg). Her temporal temperature is 98.5 F (36.9 C). Her blood pressure is 152/80 (abnormal) and her pulse is 98. Her respiration is 18 and oxygen saturation is 100%. .   Lungs are clear to auscultation bilaterally. Heart has regular rate and rhythm. No palpable cervical, supraclavicular, or axillary adenopathy. Abdomen soft, non-tender, normal bowel sounds. On pelvic examination the external genitalia were unremarkable. A speculum exam was performed. There are no mucosal lesions noted in the vaginal vault. On bimanual  examination there were no pelvic masses appreciated. Vaginal cuff intact.  Some radiation changes noted at the vaginal cuff.   Lab Findings: Lab Results  Component Value Date   WBC 5.2 04/27/2019   HGB 10.0 (L) 04/27/2019   HCT 29.7 (L) 04/27/2019   MCV 96.7 04/27/2019   PLT 212 04/27/2019    Radiographic Findings: No results found.  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  Plan: The patient will follow-up with Dr. Denman George in three months and with radiation oncology in six months.  ____________________________________   Blair Promise, PhD, MD  This document serves as a record of services personally performed by Gery Pray, MD. It was created on his behalf by Clerance Lav, a trained medical scribe. The creation of  this record is based on the scribe's personal observations and the provider's statements to them. This document has been checked and approved by the attending provider.

## 2019-09-15 DIAGNOSIS — K5730 Diverticulosis of large intestine without perforation or abscess without bleeding: Secondary | ICD-10-CM | POA: Diagnosis not present

## 2019-09-15 DIAGNOSIS — K648 Other hemorrhoids: Secondary | ICD-10-CM | POA: Diagnosis not present

## 2019-09-15 DIAGNOSIS — K635 Polyp of colon: Secondary | ICD-10-CM | POA: Diagnosis not present

## 2019-09-15 DIAGNOSIS — Z86010 Personal history of colonic polyps: Secondary | ICD-10-CM | POA: Diagnosis not present

## 2019-09-16 ENCOUNTER — Ambulatory Visit: Payer: PPO | Attending: Internal Medicine

## 2019-09-16 DIAGNOSIS — Z23 Encounter for immunization: Secondary | ICD-10-CM

## 2019-09-16 NOTE — Progress Notes (Signed)
   Covid-19 Vaccination Clinic  Name:  Zoe Kim    MRN: [id number] DOB: 04/07/1946  09/16/2019  [name] was observed post Covid-19 immunization for 15 minutes without incident. She was provided with Vaccine Information Sheet and instruction to access the V-Safe system.   [name] was instructed to call 911 with any severe reactions post vaccine: Marland Kitchen Difficulty breathing  . Swelling of face and throat  . A fast heartbeat  . A bad rash all over body  . Dizziness and weakness   Immunizations Administered    Name Date Dose VIS Date Route   Moderna COVID-19 Vaccine 09/16/2019 11:59 AM 0.5 mL 05/05/2019 Intramuscular   Manufacturer: Moderna   Lot: [id number]   [id number]

## 2019-09-18 DIAGNOSIS — M546 Pain in thoracic spine: Secondary | ICD-10-CM | POA: Diagnosis not present

## 2019-09-18 DIAGNOSIS — K635 Polyp of colon: Secondary | ICD-10-CM | POA: Diagnosis not present

## 2019-09-24 DIAGNOSIS — G629 Polyneuropathy, unspecified: Secondary | ICD-10-CM | POA: Diagnosis not present

## 2019-09-24 DIAGNOSIS — L608 Other nail disorders: Secondary | ICD-10-CM | POA: Diagnosis not present

## 2019-09-24 DIAGNOSIS — C541 Malignant neoplasm of endometrium: Secondary | ICD-10-CM | POA: Diagnosis not present

## 2019-09-24 DIAGNOSIS — F321 Major depressive disorder, single episode, moderate: Secondary | ICD-10-CM | POA: Diagnosis not present

## 2019-09-29 DIAGNOSIS — M47814 Spondylosis without myelopathy or radiculopathy, thoracic region: Secondary | ICD-10-CM | POA: Diagnosis not present

## 2019-09-29 DIAGNOSIS — M4855XD Collapsed vertebra, not elsewhere classified, thoracolumbar region, subsequent encounter for fracture with routine healing: Secondary | ICD-10-CM | POA: Diagnosis not present

## 2019-09-29 DIAGNOSIS — M546 Pain in thoracic spine: Secondary | ICD-10-CM | POA: Diagnosis not present

## 2019-10-01 ENCOUNTER — Ambulatory Visit: Payer: PPO

## 2019-10-01 ENCOUNTER — Other Ambulatory Visit: Payer: PPO

## 2019-10-02 DIAGNOSIS — M25512 Pain in left shoulder: Secondary | ICD-10-CM | POA: Diagnosis not present

## 2019-10-02 DIAGNOSIS — M6281 Muscle weakness (generalized): Secondary | ICD-10-CM | POA: Diagnosis not present

## 2019-10-02 DIAGNOSIS — M4004 Postural kyphosis, thoracic region: Secondary | ICD-10-CM | POA: Diagnosis not present

## 2019-10-06 DIAGNOSIS — M4004 Postural kyphosis, thoracic region: Secondary | ICD-10-CM | POA: Diagnosis not present

## 2019-10-06 DIAGNOSIS — M25512 Pain in left shoulder: Secondary | ICD-10-CM | POA: Diagnosis not present

## 2019-10-06 DIAGNOSIS — M6281 Muscle weakness (generalized): Secondary | ICD-10-CM | POA: Diagnosis not present

## 2019-10-13 DIAGNOSIS — M6281 Muscle weakness (generalized): Secondary | ICD-10-CM | POA: Diagnosis not present

## 2019-10-13 DIAGNOSIS — M25512 Pain in left shoulder: Secondary | ICD-10-CM | POA: Diagnosis not present

## 2019-10-13 DIAGNOSIS — M4004 Postural kyphosis, thoracic region: Secondary | ICD-10-CM | POA: Diagnosis not present

## 2019-10-14 DIAGNOSIS — M545 Low back pain: Secondary | ICD-10-CM | POA: Diagnosis not present

## 2019-10-16 DIAGNOSIS — M25512 Pain in left shoulder: Secondary | ICD-10-CM | POA: Diagnosis not present

## 2019-10-16 DIAGNOSIS — M4004 Postural kyphosis, thoracic region: Secondary | ICD-10-CM | POA: Diagnosis not present

## 2019-10-16 DIAGNOSIS — M6281 Muscle weakness (generalized): Secondary | ICD-10-CM | POA: Diagnosis not present

## 2019-10-20 DIAGNOSIS — M4004 Postural kyphosis, thoracic region: Secondary | ICD-10-CM | POA: Diagnosis not present

## 2019-10-20 DIAGNOSIS — M25512 Pain in left shoulder: Secondary | ICD-10-CM | POA: Diagnosis not present

## 2019-10-20 DIAGNOSIS — M6281 Muscle weakness (generalized): Secondary | ICD-10-CM | POA: Diagnosis not present

## 2019-10-22 ENCOUNTER — Encounter: Payer: Self-pay | Admitting: Podiatry

## 2019-10-22 ENCOUNTER — Ambulatory Visit: Payer: PPO | Admitting: Podiatry

## 2019-10-22 ENCOUNTER — Other Ambulatory Visit: Payer: Self-pay

## 2019-10-22 VITALS — BP 136/84 | HR 69 | Temp 97.2°F

## 2019-10-22 DIAGNOSIS — M79675 Pain in left toe(s): Secondary | ICD-10-CM

## 2019-10-22 DIAGNOSIS — M79674 Pain in right toe(s): Secondary | ICD-10-CM

## 2019-10-22 DIAGNOSIS — G629 Polyneuropathy, unspecified: Secondary | ICD-10-CM

## 2019-10-22 DIAGNOSIS — B351 Tinea unguium: Secondary | ICD-10-CM

## 2019-10-22 NOTE — Patient Instructions (Signed)
Terbinafine oral granules What is this medicine? TERBINAFINE (TER bin a feen) is an antifungal medicine. It is used to treat certain kinds of fungal or yeast infections. This medicine may be used for other purposes; ask your health care provider or pharmacist if you have questions. COMMON BRAND NAME(S): Lamisil What should I tell my health care provider before I take this medicine? They need to know if you have any of these conditions:  drink alcoholic beverages  kidney disease  liver disease  an unusual or allergic reaction to Terbinafine, other medicines, foods, dyes, or preservatives  pregnant or trying to get pregnant  breast-feeding How should I use this medicine? Take this medicine by mouth. Follow the directions on the prescription label. Hold packet with cut line on top. Shake packet gently to settle contents. Tear packet open along cut line, or use scissors to cut across line. Carefully pour the entire contents of packet onto a spoonful of a soft food, such as pudding or other soft, non-acidic food such as mashed potatoes (do NOT use applesauce or a fruit-based food). If two packets are required for each dose, you may either sprinkle the content of both packets on one spoonful of non-acidic food, or sprinkle the contents of both packets on two spoonfuls of non-acidic food. Make sure that no granules remain in the packet. Swallow the mxiture of the food and granules without chewing. Take your medicine at regular intervals. Do not take it more often than directed. Take all of your medicine as directed even if you think you are better. Do not skip doses or stop your medicine early. Contact your pediatrician or health care professional regarding the use of this medicine in children. While this medicine may be prescribed for children as young as 4 years for selected conditions, precautions do apply. Overdosage: If you think you have taken too much of this medicine contact a poison control  center or emergency room at once. NOTE: This medicine is only for you. Do not share this medicine with others. What if I miss a dose? If you miss a dose, take it as soon as you can. If it is almost time for your next dose, take only that dose. Do not take double or extra doses. What may interact with this medicine? Do not take this medicine with any of the following medications:  thioridazine This medicine may also interact with the following medications:  beta-blockers  caffeine  cimetidine  cyclosporine  MAOIs like Carbex, Eldepryl, Marplan, Nardil, and Parnate  medicines for fungal infections like fluconazole and ketoconazole  medicines for irregular heartbeat like amiodarone, flecainide and propafenone  rifampin  SSRIs like citalopram, escitalopram, fluoxetine, fluvoxamine, paroxetine and sertraline  tricyclic antidepressants like amitriptyline, clomipramine, desipramine, imipramine, nortriptyline, and others  warfarin This list may not describe all possible interactions. Give your health care provider a list of all the medicines, herbs, non-prescription drugs, or dietary supplements you use. Also tell them if you smoke, drink alcohol, or use illegal drugs. Some items may interact with your medicine. What should I watch for while using this medicine? Your doctor may monitor your liver function. Tell your doctor right away if you have nausea or vomiting, loss of appetite, stomach pain on your right upper side, yellow skin, dark urine, light stools, or are over tired. This medicine may cause serious skin reactions. They can happen weeks to months after starting the medicine. Contact your health care provider right away if you notice fevers or flu-like symptoms   with a rash. The rash may be red or purple and then turn into blisters or peeling of the skin. Or, you might notice a red rash with swelling of the face, lips or lymph nodes in your neck or under your arms. You need to take  this medicine for 6 weeks or longer to cure the fungal infection. Take your medicine regularly for as long as your doctor or health care provider tells you to. What side effects may I notice from receiving this medicine? Side effects that you should report to your doctor or health care professional as soon as possible:  allergic reactions like skin rash or hives, swelling of the face, lips, or tongue  change in vision  dark urine  fever or infection  general ill feeling or flu-like symptoms  light-colored stools  loss of appetite, nausea  rash, fever, and swollen lymph nodes  redness, blistering, peeling or loosening of the skin, including inside the mouth  right upper belly pain  unusually weak or tired  yellowing of the eyes or skin Side effects that usually do not require medical attention (report to your doctor or health care professional if they continue or are bothersome):  changes in taste  diarrhea  hair loss  muscle or joint pain  stomach upset This list may not describe all possible side effects. Call your doctor for medical advice about side effects. You may report side effects to FDA at 1-800-FDA-1088. Where should I keep my medicine? Keep out of the reach of children. Store at room temperature between 15 and 30 degrees C (59 and 86 degrees F). Throw away any unused medicine after the expiration date. NOTE: This sheet is a summary. It may not cover all possible information. If you have questions about this medicine, talk to your doctor, pharmacist, or health care provider.  2020 Elsevier/Gold Standard (2018-08-29 15:35:11)  

## 2019-10-23 ENCOUNTER — Telehealth: Payer: Self-pay | Admitting: Podiatry

## 2019-10-23 DIAGNOSIS — M6281 Muscle weakness (generalized): Secondary | ICD-10-CM | POA: Diagnosis not present

## 2019-10-23 DIAGNOSIS — M4004 Postural kyphosis, thoracic region: Secondary | ICD-10-CM | POA: Diagnosis not present

## 2019-10-23 DIAGNOSIS — M25512 Pain in left shoulder: Secondary | ICD-10-CM | POA: Diagnosis not present

## 2019-10-23 NOTE — Progress Notes (Signed)
Subjective:   Patient ID: Zoe Kim, female   DOB: 74 y.o.   MRN: [id number]   HPI 74 year old female presents the office today for concerns of thick, discolored toenails.  She states they do cause occasional discomfort most of the right first and second toes as they rub.  Denies any drainage or pus.  No recent treatment.  She also has neuropathy due to chemotherapy.  She has no other concerns today.  No ulcerations.   Review of Systems  All other systems reviewed and are negative.  Past Medical History:  Diagnosis Date  . Anxiety   . Arthritis   . Depression   . Endometrial ca ([place])   . Family history of breast cancer   . Family history of lung cancer   . Heart murmur    "years ago"  . Insomnia   . Pneumonia    "long time ago"  . Primary localized osteoarthrosis of the knee, right   . Urinary urgency     Past Surgical History:  Procedure Laterality Date  . COLONOSCOPY    . EYE SURGERY Bilateral    cataract with lens  . IR IMAGING GUIDED PORT INSERTION  11/06/2018  . IR REMOVAL TUN ACCESS W/ PORT W/O FL MOD SED  02/13/2019  . ROBOTIC ASSISTED TOTAL HYSTERECTOMY WITH BILATERAL SALPINGO OOPHERECTOMY N/A 10/14/2018   Procedure: ROBOTIC ASSISTED TOTAL HYSTERECTOMY WITH BILATERAL SALPINGO OOPHORECTOMY;  Surgeon: Everitt Amber, MD;  Location: [facility];  Service: Gynecology;  Laterality: N/A;  . SENTINEL NODE BIOPSY N/A 10/14/2018   Procedure: SENTINEL LYMPH NODE BIOPSY;  Surgeon: Everitt Amber, MD;  Location: [facility];  Service: Gynecology;  Laterality: N/A;  . TONSILLECTOMY    . TOOTH EXTRACTION Right 08/27/2017  . TOTAL KNEE ARTHROPLASTY Right 07/30/2016   Procedure: TOTAL KNEE ARTHROPLASTY;  Surgeon: Elsie Saas, MD;  Location: [facility];  Service: Orthopedics;  Laterality: Right;  . VEIN LIGATION AND STRIPPING       Current Outpatient Medications:  .  Desvenlafaxine Succinate ER 25 MG TB24, Take 1 tablet by mouth daily., Disp: , Rfl:  .  diclofenac (VOLTAREN) 75 MG EC tablet,  Take 75 mg by mouth 2 (two) times daily., Disp: , Rfl:  .  gabapentin (NEURONTIN) 300 MG capsule, Take 300 mg by mouth at bedtime., Disp: , Rfl:  .  MEDROL 4 MG TBPK tablet, Take      dosepack as directed on package, Disp: , Rfl:  .  methocarbamol (ROBAXIN) 750 MG tablet, Take 750 mg by mouth every 8 (eight) hours as needed., Disp: , Rfl:  .  methylPREDNISolone acetate (DEPO-MEDROL) 40 MG/ML injection, Inject 80 mg into the muscle once., Disp: , Rfl:  .  oxybutynin (DITROPAN) 5 MG tablet, Take 5 mg by mouth 2 (two) times daily., Disp: , Rfl:  .  polyethylene glycol-electrolytes (NULYTELY) 420 g solution, Take 4,000 mLs by mouth as directed., Disp: , Rfl:  .  tiZANidine (ZANAFLEX) 4 MG tablet, , Disp: , Rfl:   Allergies  Allergen Reactions  . Sulfa Antibiotics     UNSPECIFIED REACTION         Objective:  Physical Exam  General: AAO x3, NAD  Dermatological: Nails appear to be hypertrophic, dystrophic, discolored with yellow to brown discoloration.  There is no significant drainage or pus.  Occasional discomfort of the nails.  No swelling or redness.  No open lesions.  Vascular: Dorsalis Pedis artery and Posterior Tibial artery pedal pulses are 2/4 bilateral with immedate capillary  fill time.  There is no pain with calf compression, swelling, warmth, erythema.   Neruologic: Sensation decreased with Semmes Weinstein monofilament  Musculoskeletal: No pain, crepitus, or limitation noted with foot and ankle range of motion bilateral. Muscular strength 5/5 in all groups tested bilateral.  Gait: Unassisted, Nonantalgic.       Assessment:   Symptomatic onychomycosis; neuropathy     Plan:  -Treatment options discussed including all alternatives, risks, and complications -Etiology of symptoms were discussed -Nails debrided 10 without complications or bleeding.  Discussed treatment options for nail fungus.  She is to consider oral medicine I gave her information about Lamisil. -Daily  foot inspection -Follow-up in 3 months or sooner if any problems arise. In the meantime, encouraged to call the office with any questions, concerns, change in symptoms.   Celesta Gentile, DPM

## 2019-10-23 NOTE — Telephone Encounter (Signed)
Dr. Jacqualyn Posey was going to prescribe medication for Zoe Kim. She decided she needed time to think about it and now she wants the medication. Please advise.

## 2019-10-23 NOTE — Telephone Encounter (Signed)
Left message requesting pt call for information concerning the offered medication.

## 2019-10-27 ENCOUNTER — Telehealth: Payer: Self-pay

## 2019-10-27 DIAGNOSIS — M6281 Muscle weakness (generalized): Secondary | ICD-10-CM | POA: Diagnosis not present

## 2019-10-27 DIAGNOSIS — M25512 Pain in left shoulder: Secondary | ICD-10-CM | POA: Diagnosis not present

## 2019-10-27 DIAGNOSIS — M4004 Postural kyphosis, thoracic region: Secondary | ICD-10-CM | POA: Diagnosis not present

## 2019-10-27 NOTE — Telephone Encounter (Signed)
Attempted to call patient to inform of F/U visit scheduled with Dr. Denman George on 12/16/19 at 1:30 pm for follow up.

## 2019-10-30 DIAGNOSIS — M6281 Muscle weakness (generalized): Secondary | ICD-10-CM | POA: Diagnosis not present

## 2019-10-30 DIAGNOSIS — M4004 Postural kyphosis, thoracic region: Secondary | ICD-10-CM | POA: Diagnosis not present

## 2019-10-30 DIAGNOSIS — M25512 Pain in left shoulder: Secondary | ICD-10-CM | POA: Diagnosis not present

## 2019-12-14 ENCOUNTER — Telehealth: Payer: Self-pay | Admitting: *Deleted

## 2019-12-14 NOTE — Telephone Encounter (Signed)
Per patient request, moved appt from 7/14 to 7/19

## 2019-12-15 ENCOUNTER — Other Ambulatory Visit: Payer: Self-pay

## 2019-12-15 ENCOUNTER — Ambulatory Visit
Admission: RE | Admit: 2019-12-15 | Discharge: 2019-12-15 | Disposition: A | Discharge status: Home / Self Care | Payer: PPO | Source: Ambulatory Visit | Attending: Family Medicine | Admitting: Family Medicine

## 2019-12-15 DIAGNOSIS — Z1231 Encounter for screening mammogram for malignant neoplasm of breast: Secondary | ICD-10-CM | POA: Diagnosis not present

## 2019-12-15 DIAGNOSIS — E2839 Other primary ovarian failure: Secondary | ICD-10-CM

## 2019-12-15 DIAGNOSIS — Z780 Asymptomatic menopausal state: Secondary | ICD-10-CM | POA: Diagnosis not present

## 2019-12-16 ENCOUNTER — Inpatient Hospital Stay: Payer: PPO | Admitting: Gynecologic Oncology

## 2019-12-21 ENCOUNTER — Other Ambulatory Visit: Payer: Self-pay

## 2019-12-21 ENCOUNTER — Encounter: Payer: Self-pay | Admitting: Gynecologic Oncology

## 2019-12-21 ENCOUNTER — Inpatient Hospital Stay: Payer: PPO | Attending: Gynecologic Oncology | Admitting: Gynecologic Oncology

## 2019-12-21 VITALS — BP 128/71 | HR 81 | Temp 98.3°F | Resp 17 | Ht 64.0 in | Wt 173.8 lb

## 2019-12-21 DIAGNOSIS — M1990 Unspecified osteoarthritis, unspecified site: Secondary | ICD-10-CM | POA: Diagnosis not present

## 2019-12-21 DIAGNOSIS — Z79899 Other long term (current) drug therapy: Secondary | ICD-10-CM | POA: Diagnosis not present

## 2019-12-21 DIAGNOSIS — Z923 Personal history of irradiation: Secondary | ICD-10-CM | POA: Insufficient documentation

## 2019-12-21 DIAGNOSIS — Z90710 Acquired absence of both cervix and uterus: Secondary | ICD-10-CM | POA: Diagnosis not present

## 2019-12-21 DIAGNOSIS — Z90722 Acquired absence of ovaries, bilateral: Secondary | ICD-10-CM | POA: Insufficient documentation

## 2019-12-21 DIAGNOSIS — E669 Obesity, unspecified: Secondary | ICD-10-CM | POA: Diagnosis not present

## 2019-12-21 DIAGNOSIS — C541 Malignant neoplasm of endometrium: Secondary | ICD-10-CM | POA: Diagnosis not present

## 2019-12-21 DIAGNOSIS — Z6832 Body mass index (BMI) 32.0-32.9, adult: Secondary | ICD-10-CM | POA: Diagnosis not present

## 2019-12-21 DIAGNOSIS — Z791 Long term (current) use of non-steroidal anti-inflammatories (NSAID): Secondary | ICD-10-CM | POA: Diagnosis not present

## 2019-12-21 DIAGNOSIS — R350 Frequency of micturition: Secondary | ICD-10-CM | POA: Insufficient documentation

## 2019-12-21 DIAGNOSIS — Z9221 Personal history of antineoplastic chemotherapy: Secondary | ICD-10-CM | POA: Diagnosis not present

## 2019-12-21 NOTE — Patient Instructions (Signed)
Please notify Dr Denman George at phone number [phone] if you notice vaginal bleeding, new pelvic or abdominal pains, bloating, feeling full easy, or a change in bladder or bowel function.   Please have Dr Clabe Seal office contact Dr Serita Grit office (at [phone]) in October after your visit with him to request an appointment with her for January, 2022.

## 2019-12-21 NOTE — Progress Notes (Signed)
Follow-up Note: Gyn-Onc  Consult was requested by Dr. [name] for the evaluation of Zoe Kim 74 y.o. female  CC:  Chief Complaint  Patient presents with   endometrial cancer    follow-up    Assessment/Plan:  Zoe Kim  is a 74 y.o.  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   s/p 6 cycles of carboplatin paclitaxel chemotherapy and vaginal brachytherapy completed 03/04/19.   Complete clinical response.   Recommend continued 3 monthly surveillance until September, 2022. I will see her back in July, Dr Sondra Come will see her in April, 2021.   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  The patient reports 6 months or maybe longer of intermittent vaginal spotting.  She was seen by Dr. [name] on September 19, 2018 who performed a Pap smear which revealed atypical glandular cells and an endocervical biopsy which revealed a benign endocervical polyp, and endometrial biopsy which revealed high-grade serous endometrial adenocarcinoma and adenocarcinoma FIGO grade 2.  The patient has a personal history for being nulliparous with the exception of 1 trimester miscarriage.  She denies hypertension hyperlipidemia diabetes mellitus.  She is overweight with a BMI of 32 kg/m.  Had a knee replacement but no prior abdominal surgeries.  She has no history of anticoagulant use.  Preoperative CT imaging showed no concerning evidence for metastatic disease.  On Oct 14, 2018 she underwent a robotic assisted total hysterectomy with BSO and sentinel lymph node biopsy.  Intraoperative findings were significant for an 8 cm arcuate uterus with normal-appearing tubes and ovaries and a narrow vaginal introitus.  There was no gross extrauterine disease identified intraoperatively.  Final pathology revealed a FIGO  grade 3 mixed endometrioid and serous carcinoma with invasion of less than half of the myometrium (6 of 17 mm).  Lymphovascular space invasion was present.  The cervical stroma was not involved.  The adnexa were free of disease.  There was 1 macro metastasis in 1 of 3 sentinel lymph nodes (this was occurring in the right external iliac node).  The tumor was MMR normal and MSS, and negative for HER-2.  She was determined to have stage III C1 high-grade serous endometrial cancer and was dispositioned to receive adjuvant therapy with chemotherapy and vaginal brachytherapy in accordance with NCCN guidelines.  Interval Hx:  She went on to receive 6 cycles of carboplatin and paclitaxel chemotherapy between 11/10/18 and 04/27/19 in the addition of vaginal brachytherapy (30 Gy in 5 fractions, dates of treatment 01/15/19 to 03/04/19).  She has lower extremity neuropathy which is predominantly numbness rather than pain. 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  She is back riding horses. She has urinary frequency (no UTI) due to decreased bladder capacity.   Current Meds:  Outpatient Encounter Medications as of 12/21/2019  Medication Sig   Desvenlafaxine Succinate ER 25 MG TB24 Take 1 tablet by mouth daily.   diclofenac (VOLTAREN) 75 MG EC tablet Take 75 mg by mouth 2 (two) times daily.   gabapentin (NEURONTIN) 300 MG capsule Take 300 mg by mouth at bedtime.   MEDROL 4 MG TBPK tablet Take      dosepack as directed on package   methocarbamol (ROBAXIN) 750 MG tablet Take 750 mg by mouth every 8 (eight) hours as needed.   methylPREDNISolone acetate (  DEPO-MEDROL) 40 MG/ML injection Inject 80 mg into the muscle once.   oxybutynin (DITROPAN) 5 MG tablet Take 5 mg by mouth 2 (two) times daily.   polyethylene glycol-electrolytes (NULYTELY) 420 g solution Take 4,000 mLs by mouth as directed.   tiZANidine (ZANAFLEX) 4 MG tablet    No facility-administered encounter medications on file as  of 12/21/2019.    Allergy:  Allergies  Allergen Reactions   Sulfa Antibiotics     UNSPECIFIED REACTION     Social Hx:   Social History   Socioeconomic History   Marital status: Married    Spouse name: Not on file   Number of children: 0   Years of education: Not on file   Highest education level: Bachelor's degree (e.g., BA, AB, BS)  Occupational History   Occupation: retired Pharmacist, hospital  Tobacco Use   Smoking status: Never Smoker   Smokeless tobacco: Never Used   Tobacco comment: smoked 2 weeks in college  Vaping Use   Vaping Use: Never used  Substance and Sexual Activity   Alcohol use: No   Drug use: No   Sexual activity: Not Currently  Other Topics Concern   Not on file  Social History Narrative   Not on file   Social Determinants of Health   Financial Resource Strain:    Difficulty of Paying Living Expenses:   Food Insecurity:    Worried About Charity fundraiser in the Last Year:    Arboriculturist in the Last Year:   Transportation Needs:    Film/video editor (Medical):    Lack of Transportation (Non-Medical):   Physical Activity:    Days of Exercise per Week:    Minutes of Exercise per Session:   Stress:    Feeling of Stress :   Social Connections:    Frequency of Communication with Friends and Family:    Frequency of Social Gatherings with Friends and Family:    Attends Religious Services:    Active Member of Clubs or Organizations:    Attends Music therapist:    Marital Status:   Intimate Partner Violence:    Fear of Current or Ex-Partner:    Emotionally Abused:    Physically Abused:    Sexually Abused:     Past Surgical Hx:  Past Surgical History:  Procedure Laterality Date   COLONOSCOPY     EYE SURGERY Bilateral    cataract with lens   IR IMAGING GUIDED PORT INSERTION  11/06/2018   IR REMOVAL TUN ACCESS W/ PORT W/O FL MOD SED  02/13/2019   ROBOTIC ASSISTED TOTAL HYSTERECTOMY WITH  BILATERAL SALPINGO OOPHERECTOMY N/A 10/14/2018   Procedure: ROBOTIC ASSISTED TOTAL HYSTERECTOMY WITH BILATERAL SALPINGO OOPHORECTOMY;  Surgeon: Everitt Amber, MD;  Location: [facility];  Service: Gynecology;  Laterality: N/A;   SENTINEL NODE BIOPSY N/A 10/14/2018   Procedure: SENTINEL LYMPH NODE BIOPSY;  Surgeon: Everitt Amber, MD;  Location: [facility];  Service: Gynecology;  Laterality: N/A;   TONSILLECTOMY     TOOTH EXTRACTION Right 08/27/2017   TOTAL KNEE ARTHROPLASTY Right 07/30/2016   Procedure: TOTAL KNEE ARTHROPLASTY;  Surgeon: Elsie Saas, MD;  Location: [place];  Service: Orthopedics;  Laterality: Right;   VEIN LIGATION AND STRIPPING      Past Medical Hx:  Past Medical History:  Diagnosis Date   Anxiety    Arthritis    Depression    Endometrial ca [facility])    Family history of breast cancer    Family  history of lung cancer    Heart murmur    "years ago"   Insomnia    Pneumonia    "long time ago"   Primary localized osteoarthrosis of the knee, right    Urinary urgency     Past Gynecological History:  See HPI No LMP recorded. Patient is postmenopausal.  Family Hx:  Family History  Problem Relation Age of Onset   Heart disease Mother    COPD Mother    Cancer Mother        uterine (possibly, pt unsure)   Breast cancer Mother        dx in 34s   Heart disease Father    COPD Brother    Heart disease Brother    Cancer Brother 77       lung ca, smoker   Cancer Maternal Grandmother        breast dx early 3s   Stroke Maternal Grandfather     Review of Systems:  Constitutional  Feels well,    ENT Normal appearing ears and nares bilaterally Skin/Breast  No rash, sores, jaundice, itching, dryness Cardiovascular  No chest pain, shortness of breath, or edema  Pulmonary  No cough or wheeze.  Gastro Intestinal  No nausea, vomitting, or diarrhoea. No bright red blood per rectum, no abdominal pain, change in bowel movement, or constipation.  Genito  Urinary  No frequency, urgency, dysuria, no bleeding Musculo Skeletal  No myalgia, arthralgia, joint swelling or pain  Neurologic  No weakness, numbness, change in gait,  Psychology  No depression, anxiety, insomnia.   Vitals:  Blood pressure 128/71, pulse 81, temperature 98.3 F (36.8 C), temperature source Oral, resp. rate 17, height _0  (1.626 m), weight 173 lb 12.8 oz (78.8 kg), SpO2 100 %.  Physical Exam: WD in NAD Neck  Supple NROM, without any enlargements.  Lymph Node Survey No cervical supraclavicular or inguinal adenopathy Cardiovascular  Pulse normal rate, regularity and rhythm. S1 and S2 normal.  Lungs  Clear to auscultation bilateraly, without wheezes/crackles/rhonchi. Good air movement.  Skin  No rash/lesions/breakdown  Psychiatry  Alert and oriented to person, place, and time  Abdomen  Normoactive bowel sounds, abdomen soft, non-tender and overweight without evidence of hernia. Soft incisions Back No CVA tenderness Genito Urinary  Vaginal cuff in tact with no lesions or masses. Smooth, shortened, no recurrence Rectal  deferred Extremities  No bilateral cyanosis, clubbing or edema.  Thereasa Solo, MD  12/21/2019, 3:42 PM

## 2020-01-06 DIAGNOSIS — R5383 Other fatigue: Secondary | ICD-10-CM | POA: Diagnosis not present

## 2020-01-06 DIAGNOSIS — F321 Major depressive disorder, single episode, moderate: Secondary | ICD-10-CM | POA: Diagnosis not present

## 2020-01-06 DIAGNOSIS — G629 Polyneuropathy, unspecified: Secondary | ICD-10-CM | POA: Diagnosis not present

## 2020-01-22 ENCOUNTER — Ambulatory Visit: Payer: PPO | Admitting: Podiatry

## 2020-02-05 DIAGNOSIS — R748 Abnormal levels of other serum enzymes: Secondary | ICD-10-CM | POA: Diagnosis not present

## 2020-03-02 DIAGNOSIS — Z1231 Encounter for screening mammogram for malignant neoplasm of breast: Secondary | ICD-10-CM | POA: Diagnosis not present

## 2020-03-17 ENCOUNTER — Ambulatory Visit
Admission: RE | Admit: 2020-03-17 | Discharge: 2020-03-17 | Disposition: A | Discharge status: Home / Self Care | Payer: PPO | Source: Ambulatory Visit | Attending: Radiation Oncology | Admitting: Radiation Oncology

## 2020-03-17 ENCOUNTER — Telehealth: Payer: Self-pay | Admitting: *Deleted

## 2020-03-17 NOTE — Telephone Encounter (Signed)
Schedule an appointment    No voice mail.  Mailbox is full

## 2020-03-24 ENCOUNTER — Encounter: Payer: Self-pay | Admitting: Radiation Oncology

## 2020-03-24 ENCOUNTER — Other Ambulatory Visit: Payer: Self-pay

## 2020-03-24 ENCOUNTER — Ambulatory Visit
Admission: RE | Admit: 2020-03-24 | Discharge: 2020-03-24 | Disposition: A | Discharge status: Home / Self Care | Payer: PPO | Source: Ambulatory Visit | Attending: Radiation Oncology | Admitting: Radiation Oncology

## 2020-03-24 VITALS — BP 127/66 | HR 87 | Temp 97.8°F | Resp 18 | Ht 66.0 in | Wt 172.2 lb

## 2020-03-24 DIAGNOSIS — Z8542 Personal history of malignant neoplasm of other parts of uterus: Secondary | ICD-10-CM | POA: Insufficient documentation

## 2020-03-24 DIAGNOSIS — Z79899 Other long term (current) drug therapy: Secondary | ICD-10-CM | POA: Insufficient documentation

## 2020-03-24 DIAGNOSIS — Z923 Personal history of irradiation: Secondary | ICD-10-CM | POA: Insufficient documentation

## 2020-03-24 DIAGNOSIS — R32 Unspecified urinary incontinence: Secondary | ICD-10-CM | POA: Diagnosis not present

## 2020-03-24 DIAGNOSIS — Z9221 Personal history of antineoplastic chemotherapy: Secondary | ICD-10-CM | POA: Diagnosis not present

## 2020-03-24 DIAGNOSIS — C541 Malignant neoplasm of endometrium: Secondary | ICD-10-CM

## 2020-03-24 DIAGNOSIS — Z08 Encounter for follow-up examination after completed treatment for malignant neoplasm: Secondary | ICD-10-CM | POA: Diagnosis not present

## 2020-03-24 NOTE — Progress Notes (Signed)
Patient here for a 6 month f/u visit with Dr. Sondra Come. Last radiation was 1  Year and 3 months ago. Patient denies vaginal bleeding. Patient wears a pad for prn incontinence. Patient feels occ constipation. She reports neuropathy in her feet and starting to go up her ankles. Patient concerned about some memory issues and plans to get evaluated later this year.  BP 127/66 (BP Location: Left Arm, Patient Position: Sitting, Cuff Size: Large)   Pulse 87   Temp 97.8 F (36.6 C)   Resp 18   Ht 5\' 6"  (1.676 m)   Wt 172 lb 3.2 oz (78.1 kg)   SpO2 99%   BMI 27.79 kg/m   Wt Readings from Last 3 Encounters:  03/24/20 172 lb 3.2 oz (78.1 kg)  12/21/19 173 lb 12.8 oz (78.8 kg)  09/14/19 170 lb 6 oz (77.3 kg)

## 2020-03-24 NOTE — Progress Notes (Signed)
Radiation Oncology         [phone] ________________________________  Name: Zoe Kim MRN: [id number]  Date: 03/24/2020  DOB: [birth year]  Follow-Up Visit Note  CC: Lujean Amel, MD  [name], MD    ICD-10-CM   1. Endometrial cancer ([place])  C54.1     Diagnosis: FIGO Stage III-C1(pT1a, pN1a) mixed cell carcinoma of endometrium (mixed endometrioid carcinoma and serous carcinoma), with invasion to less than half of the myometrium  Interval Since Last Radiation: One year and three weeks  01/15/2019 through 03/04/2019 Site Technique Total Dose (Gy) Dose per Fx (Gy) Completed Fx Beam Energies  Pelvis: Vaginal Cylinder (2.5 cm) HDR, Brachii Therapy (3.0 cm length) 30/30 6 5/5 Iridium-192    Narrative:  The patient returns today for routine follow-up. Since her last visit, she underwent a bilateral screening mammogram on 12/15/2019 that did not show any evidence of malignancy.   She was last seen by Dr. Denman George on 12/21/2019, during which time she was noted to have had a complete clinical response to chemotherapy/vaginal brachytherapy.   On review of systems, she reports mild problems with urinary incontinence.  This has been present since her surgery and radiation therapy.  Discussed consideration for physical therapy evaluation will reconsider after the first of the year at her request. She denies pelvic pain abdominal bloating or vaginal bleeding.  She is not using her vaginal dilator and recommended resuming.  ALLERGIES:  is allergic to sulfa antibiotics.  Meds: Current Outpatient Medications  Medication Sig Dispense Refill   Desvenlafaxine Succinate ER 25 MG TB24 Take 1 tablet by mouth daily.     gabapentin (NEURONTIN) 300 MG capsule Take 300 mg by mouth at bedtime.     oxybutynin (DITROPAN) 5 MG tablet Take 5 mg by mouth 2 (two) times daily.     No current facility-administered medications for this encounter.    Physical Findings: The patient is in  no acute distress. Patient is alert and oriented.  height is 5\' 6"  (1.676 m) and weight is 172 lb 3.2 oz (78.1 kg). Her temperature is 97.8 F (36.6 C). Her blood pressure is 127/66 and her pulse is 87. Her respiration is 18 and oxygen saturation is 99%.  Lungs are clear to auscultation bilaterally. Heart has regular rate and rhythm. No palpable cervical, supraclavicular, or axillary adenopathy. Abdomen soft, non-tender, normal bowel sounds. On pelvic examination the external genitalia were unremarkable. A speculum exam was performed. There are no mucosal lesions noted in the vaginal vault. On bimanual  examination there were no pelvic masses appreciated. Vaginal cuff intact.  Some radiation changes noted at the vaginal cuff.  The bandlike area in the upper vaginal area limits exam somewhat but does permit index finger and view of the vaginal cuff.   Lab Findings: Lab Results  Component Value Date   WBC 5.2 04/27/2019   HGB 10.0 (L) 04/27/2019   HCT 29.7 (L) 04/27/2019   MCV 96.7 04/27/2019   PLT 212 04/27/2019    Radiographic Findings: No results found.  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  Encouraged her to resume using the vaginal dilator  Plan: The patient will follow-up with Dr. Denman George in three months and with radiation oncology in six months.  Total time spent in this encounter was 20 minutes which included reviewing the patient's most  recent screening mammogram, follow-up with Dr. Denman George, physical examination, and documentation.  ____________________________________   Blair Promise, PhD, MD  This document serves as a record of services personally performed by Gery Pray, MD. It was created on his behalf by Clerance Lav, a trained medical scribe. The creation of this record is based  on the scribe's personal observations and the provider's statements to them. This document has been checked and approved by the attending provider.

## 2020-07-06 DIAGNOSIS — F321 Major depressive disorder, single episode, moderate: Secondary | ICD-10-CM | POA: Diagnosis not present

## 2020-07-06 DIAGNOSIS — R5382 Chronic fatigue, unspecified: Secondary | ICD-10-CM | POA: Diagnosis not present

## 2020-07-06 DIAGNOSIS — G629 Polyneuropathy, unspecified: Secondary | ICD-10-CM | POA: Diagnosis not present

## 2020-07-06 DIAGNOSIS — R32 Unspecified urinary incontinence: Secondary | ICD-10-CM | POA: Diagnosis not present

## 2020-07-06 DIAGNOSIS — E538 Deficiency of other specified B group vitamins: Secondary | ICD-10-CM | POA: Diagnosis not present

## 2020-07-06 DIAGNOSIS — E559 Vitamin D deficiency, unspecified: Secondary | ICD-10-CM | POA: Diagnosis not present

## 2020-09-06 DIAGNOSIS — F321 Major depressive disorder, single episode, moderate: Secondary | ICD-10-CM | POA: Diagnosis not present

## 2020-09-06 DIAGNOSIS — R32 Unspecified urinary incontinence: Secondary | ICD-10-CM | POA: Diagnosis not present

## 2020-09-06 DIAGNOSIS — G629 Polyneuropathy, unspecified: Secondary | ICD-10-CM | POA: Diagnosis not present

## 2020-09-20 DIAGNOSIS — L218 Other seborrheic dermatitis: Secondary | ICD-10-CM | POA: Diagnosis not present

## 2020-09-20 DIAGNOSIS — L570 Actinic keratosis: Secondary | ICD-10-CM | POA: Diagnosis not present

## 2020-09-20 DIAGNOSIS — L821 Other seborrheic keratosis: Secondary | ICD-10-CM | POA: Diagnosis not present

## 2020-12-09 ENCOUNTER — Telehealth: Payer: Self-pay

## 2020-12-09 DIAGNOSIS — N3941 Urge incontinence: Secondary | ICD-10-CM | POA: Diagnosis not present

## 2020-12-09 DIAGNOSIS — G629 Polyneuropathy, unspecified: Secondary | ICD-10-CM | POA: Diagnosis not present

## 2020-12-09 DIAGNOSIS — R000 Tachycardia, unspecified: Secondary | ICD-10-CM | POA: Diagnosis not present

## 2020-12-09 DIAGNOSIS — F321 Major depressive disorder, single episode, moderate: Secondary | ICD-10-CM | POA: Diagnosis not present

## 2020-12-09 DIAGNOSIS — R6889 Other general symptoms and signs: Secondary | ICD-10-CM | POA: Diagnosis not present

## 2020-12-09 DIAGNOSIS — R233 Spontaneous ecchymoses: Secondary | ICD-10-CM | POA: Diagnosis not present

## 2020-12-09 DIAGNOSIS — R079 Chest pain, unspecified: Secondary | ICD-10-CM | POA: Diagnosis not present

## 2020-12-09 NOTE — Telephone Encounter (Signed)
NOTES ON FILE FROM [facility]....DR. Jonny Ruiz [phone]. REFERRAL SENT TO SCHEDULING.

## 2020-12-15 DIAGNOSIS — L570 Actinic keratosis: Secondary | ICD-10-CM | POA: Diagnosis not present

## 2020-12-15 DIAGNOSIS — C44529 Squamous cell carcinoma of skin of other part of trunk: Secondary | ICD-10-CM | POA: Diagnosis not present

## 2020-12-15 DIAGNOSIS — L738 Other specified follicular disorders: Secondary | ICD-10-CM | POA: Diagnosis not present

## 2020-12-15 DIAGNOSIS — L821 Other seborrheic keratosis: Secondary | ICD-10-CM | POA: Diagnosis not present

## 2021-01-05 DIAGNOSIS — F419 Anxiety disorder, unspecified: Secondary | ICD-10-CM | POA: Diagnosis not present

## 2021-01-05 DIAGNOSIS — G629 Polyneuropathy, unspecified: Secondary | ICD-10-CM | POA: Diagnosis not present

## 2021-01-28 IMAGING — US IR LEFT FLUORO GUIDE CV LINE
1 series · 1 of 1 positions shown · non-contrast
Comparison: none

CLINICAL DATA: History of endometrial carcinoma and need for porta
cath for chemotherapy.

[Series 1: ir fluoro/shunt/fist · 1 of 1 slices shown]
[im 1/1]
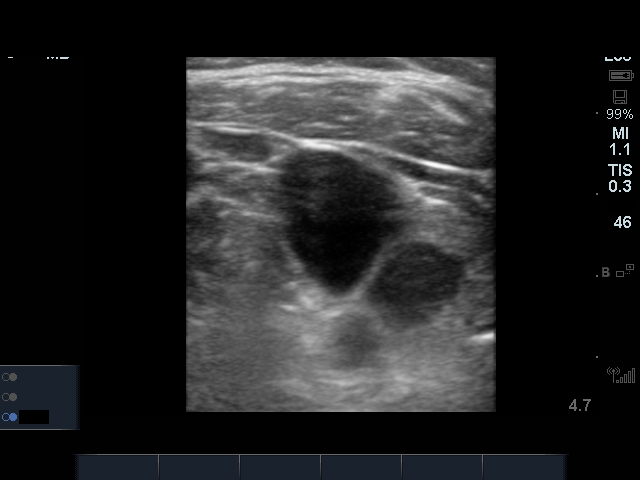

[1 of 1 positions shown; findings below may reference images not displayed]

EXAM:
IMPLANTED PORT A CATH PLACEMENT WITH ULTRASOUND AND FLUOROSCOPIC
GUIDANCE

ANESTHESIA/SEDATION:
2.0 mg IV Versed; 100 mcg IV Fentanyl

Total Moderate Sedation Time:  41 minutes

The patient's level of consciousness and physiologic status were
continuously monitored during the procedure by Radiology nursing.

Additional Medications: 2 g IV Ancef.

FLUOROSCOPY TIME:  48 seconds.  7.5 mGy.

PROCEDURE:
The procedure, risks, benefits, and alternatives were explained to
the patient. Questions regarding the procedure were encouraged and
answered. The patient understands and consents to the procedure. A
time-out was performed prior to initiating the procedure.

Ultrasound was utilized to confirm patency of the right internal
jugular vein. The right neck and chest were prepped with
chlorhexidine in a sterile fashion, and a sterile drape was applied
covering the operative field. Maximum barrier sterile technique with
sterile gowns and gloves were used for the procedure. Local
anesthesia was provided with 1% lidocaine.

After creating a small venotomy incision, a 21 gauge needle was
advanced into the right internal jugular vein under direct,
real-time ultrasound guidance. Ultrasound image documentation was
performed. After securing guidewire access, an 8 Fr dilator was
placed. A J-wire was kinked to measure appropriate catheter length.

A subcutaneous port pocket was then created along the upper chest
wall utilizing sharp and blunt dissection. Portable cautery was
utilized. The pocket was irrigated with sterile saline.

A single lumen power injectable port was chosen for placement. The 8
Fr catheter was tunneled from the port pocket site to the venotomy
incision. The port was placed in the pocket. External catheter was
trimmed to appropriate length based on guidewire measurement.

At the venotomy, an 8 Fr peel-away sheath was placed over a
guidewire. The catheter was then placed through the sheath and the
sheath removed. Final catheter positioning was confirmed and
documented with a fluoroscopic spot image. The port was accessed
with a needle and aspirated and flushed with heparinized saline. The
access needle was removed.

The venotomy and port pocket incisions were closed with subcutaneous
3-0 Monocryl and subcuticular 4-0 Vicryl. Dermabond was applied to
both incisions.

COMPLICATIONS:
COMPLICATIONS
None
FINDINGS: After catheter placement, the tip lies at the Afirah junction.
The catheter aspirates normally and is ready for immediate use.
IMPRESSION: Placement of single lumen port a cath via right internal jugular
vein. The catheter tip lies at the Afirah junction. A power
injectable port a cath was placed and is ready for immediate use.

## 2021-01-30 ENCOUNTER — Other Ambulatory Visit: Payer: Self-pay | Admitting: Family Medicine

## 2021-01-30 DIAGNOSIS — Z1231 Encounter for screening mammogram for malignant neoplasm of breast: Secondary | ICD-10-CM

## 2021-02-16 ENCOUNTER — Ambulatory Visit: Payer: PPO

## 2021-02-22 DIAGNOSIS — L239 Allergic contact dermatitis, unspecified cause: Secondary | ICD-10-CM | POA: Diagnosis not present

## 2021-03-08 DIAGNOSIS — Z1231 Encounter for screening mammogram for malignant neoplasm of breast: Secondary | ICD-10-CM | POA: Diagnosis not present

## 2021-03-14 ENCOUNTER — Ambulatory Visit: Payer: PPO | Admitting: Cardiology

## 2021-03-23 IMAGING — DX PORTABLE CHEST - 1 VIEW
1 series · 1 of 1 positions shown · non-contrast
Comparison: 07/05/2016

CLINICAL DATA: Per EMS: 73 y.o female hx of cervical cancer. Pt c/o
of feeling "off" and different than normal compared to other chemo
treatments since last chemo treatment last [REDACTED]. Pt c/o of
tingling in hands and feet, weakness, loss of appetite, diarrhea,
trouble sleeping, and fevers (100-101 degrees).

EXAM:
PORTABLE CHEST 1 VIEW

[chest ap]
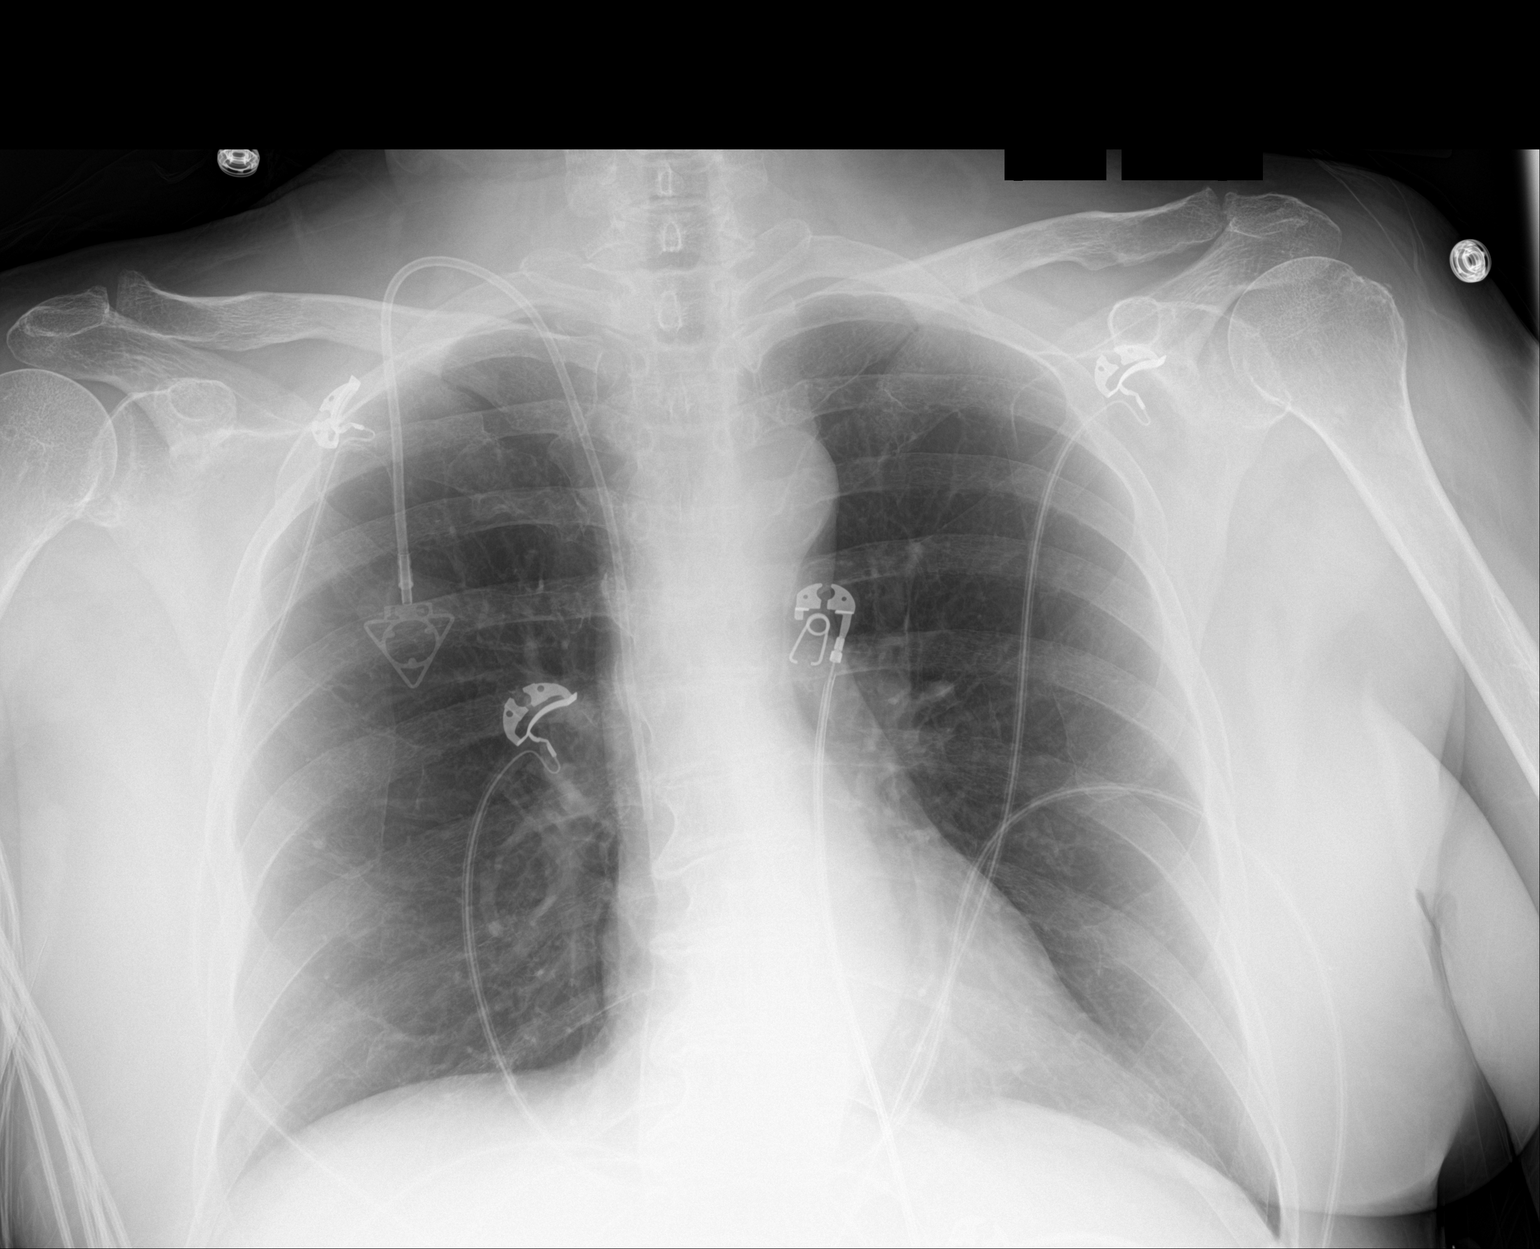

[1 of 1 positions shown; findings below may reference images not displayed]

FINDINGS: Cardiac silhouette is normal size. No mediastinal or hilar masses.
No evidence of adenopathy.

Clear lungs. No pleural effusion or pneumothorax.

Right anterior chest wall, internal jugular central venous catheter
has its tip in the lower superior vena cava.

Skeletal structures are grossly intact.
IMPRESSION: No acute cardiopulmonary disease.

## 2021-04-25 ENCOUNTER — Ambulatory Visit (INDEPENDENT_AMBULATORY_CARE_PROVIDER_SITE_OTHER): Payer: PPO | Admitting: Family Medicine

## 2021-04-25 ENCOUNTER — Other Ambulatory Visit: Payer: Self-pay

## 2021-04-25 ENCOUNTER — Encounter: Payer: Self-pay | Admitting: Family Medicine

## 2021-04-25 VITALS — BP 142/75 | HR 76 | Temp 97.4°F | Ht 64.0 in | Wt 187.0 lb

## 2021-04-25 DIAGNOSIS — Z86010 Personal history of colonic polyps: Secondary | ICD-10-CM | POA: Insufficient documentation

## 2021-04-25 DIAGNOSIS — E6609 Other obesity due to excess calories: Secondary | ICD-10-CM

## 2021-04-25 DIAGNOSIS — Z23 Encounter for immunization: Secondary | ICD-10-CM

## 2021-04-25 DIAGNOSIS — E559 Vitamin D deficiency, unspecified: Secondary | ICD-10-CM | POA: Insufficient documentation

## 2021-04-25 DIAGNOSIS — Z6832 Body mass index (BMI) 32.0-32.9, adult: Secondary | ICD-10-CM

## 2021-04-25 DIAGNOSIS — G620 Drug-induced polyneuropathy: Secondary | ICD-10-CM | POA: Diagnosis not present

## 2021-04-25 DIAGNOSIS — T451X5A Adverse effect of antineoplastic and immunosuppressive drugs, initial encounter: Secondary | ICD-10-CM

## 2021-04-25 DIAGNOSIS — Z860101 Personal history of adenomatous and serrated colon polyps: Secondary | ICD-10-CM

## 2021-04-25 DIAGNOSIS — E2839 Other primary ovarian failure: Secondary | ICD-10-CM | POA: Insufficient documentation

## 2021-04-25 DIAGNOSIS — G9332 Myalgic encephalomyelitis/chronic fatigue syndrome: Secondary | ICD-10-CM | POA: Insufficient documentation

## 2021-04-25 DIAGNOSIS — Z8542 Personal history of malignant neoplasm of other parts of uterus: Secondary | ICD-10-CM | POA: Insufficient documentation

## 2021-04-25 DIAGNOSIS — N3941 Urge incontinence: Secondary | ICD-10-CM | POA: Diagnosis not present

## 2021-04-25 DIAGNOSIS — F321 Major depressive disorder, single episode, moderate: Secondary | ICD-10-CM

## 2021-04-25 DIAGNOSIS — R413 Other amnesia: Secondary | ICD-10-CM | POA: Diagnosis not present

## 2021-04-25 DIAGNOSIS — F411 Generalized anxiety disorder: Secondary | ICD-10-CM

## 2021-04-25 DIAGNOSIS — G619 Inflammatory polyneuropathy, unspecified: Secondary | ICD-10-CM | POA: Insufficient documentation

## 2021-04-25 DIAGNOSIS — F329 Major depressive disorder, single episode, unspecified: Secondary | ICD-10-CM

## 2021-04-25 HISTORY — DX: Urge incontinence: N39.41

## 2021-04-25 HISTORY — DX: Personal history of malignant neoplasm of other parts of uterus: Z85.42

## 2021-04-25 HISTORY — DX: Other primary ovarian failure: E28.39

## 2021-04-25 HISTORY — DX: Personal history of colonic polyps: Z86.010

## 2021-04-25 HISTORY — DX: Major depressive disorder, single episode, unspecified: F32.9

## 2021-04-25 HISTORY — DX: Vitamin D deficiency, unspecified: E55.9

## 2021-04-25 HISTORY — DX: Personal history of adenomatous and serrated colon polyps: Z86.0101

## 2021-04-25 HISTORY — DX: Myalgic encephalomyelitis/chronic fatigue syndrome: G93.32

## 2021-04-25 HISTORY — DX: Generalized anxiety disorder: F41.1

## 2021-04-25 NOTE — Progress Notes (Signed)
Subjective:  Patient ID: Zoe Kim, female    DOB: 1945-10-24, 75 y.o.   MRN: [id number]  Patient Care Team: Baruch Gouty, FNP as PCP - General (Family Medicine) Sueanne Margarita, MD as PCP - Cardiology (Cardiology) Calvert Cantor, MD as Consulting Physician (Ophthalmology)   Chief Complaint:  New Patient (Initial Visit) (Establish care/Neuropathy bilateral legs below knee)   HPI: Zoe Kim is a 75 y.o. female presenting on 04/25/2021 for New Patient (Initial Visit) (Establish care/Neuropathy bilateral legs below knee)  Patient presents today to establish care with new PCP.  Patient is very vague with medical history.  EHR database incomplete.  Patient does have a history of urge incontinence, depression, polyneuropathy status post chemo and radiation therapy for endometrial cancer, and arthritis.  Her biggest concern today is her urge incontinence and ongoing memory deficits/memory loss.  States she has issues with remote and recent memory.  States this is worsened over the last year.  She states she has not seen by neurology as she can recall, but not sure if she has been seen by neurology.  She feels her memory impairment has increased her depressive symptoms.  She has a history of vitamin D daily deficiency in the past but does not repletion therapy last labs over 3 months ago.    Relevant past medical, surgical, family, and social history reviewed and updated as indicated.  Allergies and medications reviewed and updated. Data reviewed: Chart in Epic.   Past Medical History:  Diagnosis Date   Anxiety    Arthritis    Depression    Endometrial ca [facility])    Family history of breast cancer    Family history of lung cancer    Heart murmur    "years ago"   Insomnia    Pneumonia    "long time ago"   Primary localized osteoarthrosis of the knee, right    Urinary urgency     Past Surgical History:  Procedure Laterality Date   COLONOSCOPY     EYE SURGERY  Bilateral    cataract with lens   IR IMAGING GUIDED PORT INSERTION  11/06/2018   IR REMOVAL TUN ACCESS W/ PORT W/O FL MOD SED  02/13/2019   ROBOTIC ASSISTED TOTAL HYSTERECTOMY WITH BILATERAL SALPINGO OOPHERECTOMY N/A 10/14/2018   Procedure: ROBOTIC ASSISTED TOTAL HYSTERECTOMY WITH BILATERAL SALPINGO OOPHORECTOMY;  Surgeon: Everitt Amber, MD;  Location: [facility];  Service: Gynecology;  Laterality: N/A;   SENTINEL NODE BIOPSY N/A 10/14/2018   Procedure: SENTINEL LYMPH NODE BIOPSY;  Surgeon: Everitt Amber, MD;  Location: [facility];  Service: Gynecology;  Laterality: N/A;   TONSILLECTOMY     TOOTH EXTRACTION Right 08/27/2017   TOTAL KNEE ARTHROPLASTY Right 07/30/2016   Procedure: TOTAL KNEE ARTHROPLASTY;  Surgeon: Elsie Saas, MD;  Location: [place];  Service: Orthopedics;  Laterality: Right;   VEIN LIGATION AND STRIPPING      Social History   Socioeconomic History   Marital status: Married    Spouse name: Not on file   Number of children: 0   Years of education: Not on file   Highest education level: Bachelor's degree (e.g., BA, AB, BS)  Occupational History   Occupation: retired Pharmacist, hospital  Tobacco Use   Smoking status: Never   Smokeless tobacco: Never   Tobacco comments:    smoked 2 weeks in college  Vaping Use   Vaping Use: Never used  Substance and Sexual Activity   Alcohol use: No   Drug  use: No   Sexual activity: Not Currently  Other Topics Concern   Not on file  Social History Narrative   Not on file   Social Determinants of Health   Financial Resource Strain: Not on file  Food Insecurity: Not on file  Transportation Needs: Not on file  Physical Activity: Not on file  Stress: Not on file  Social Connections: Not on file  Intimate Partner Violence: Not on file    Outpatient Encounter Medications as of 04/25/2021  Medication Sig   Desvenlafaxine Succinate ER 25 MG TB24 Take 1 tablet by mouth daily.   gabapentin (NEURONTIN) 300 MG capsule Take 300 mg by mouth at bedtime.    oxybutynin (DITROPAN) 5 MG tablet Take 5 mg by mouth 2 (two) times daily.   No facility-administered encounter medications on file as of 04/25/2021.    Allergies  Allergen Reactions   Sulfa Antibiotics Hives    UNSPECIFIED REACTION     Review of Systems  Constitutional:  Positive for activity change, appetite change and fatigue. Negative for chills, diaphoresis, fever and unexpected weight change.  HENT: Negative.    Eyes: Negative.   Respiratory:  Negative for cough, chest tightness and shortness of breath.   Cardiovascular:  Negative for chest pain, palpitations and leg swelling.  Gastrointestinal:  Negative for abdominal pain, blood in stool, constipation, diarrhea, nausea and vomiting.  Endocrine: Negative.   Genitourinary:  Positive for urgency. Negative for decreased urine volume, difficulty urinating, dyspareunia, dysuria, enuresis, flank pain, frequency, genital sores, hematuria, pelvic pain, vaginal bleeding, vaginal discharge and vaginal pain.  Musculoskeletal:  Negative for arthralgias and myalgias.  Skin: Negative.   Allergic/Immunologic: Negative.   Neurological:  Positive for numbness. Negative for dizziness, tremors, seizures, syncope, facial asymmetry, speech difficulty, weakness, light-headedness and headaches.  Hematological: Negative.   Psychiatric/Behavioral:  Positive for agitation, confusion, decreased concentration, dysphoric mood and sleep disturbance. Negative for behavioral problems, hallucinations, self-injury and suicidal ideas. The patient is nervous/anxious. The patient is not hyperactive.   All other systems reviewed and are negative.      Objective:  BP (!) 142/75   Pulse 76   Temp (!) 97.4 F (36.3 C)   Ht $R'5\' 4"'JO$  (1.626 m)   Wt 187 lb (84.8 kg)   SpO2 97%   BMI 32.10 kg/m    Wt Readings from Last 3 Encounters:  04/25/21 187 lb (84.8 kg)  03/24/20 172 lb 3.2 oz (78.1 kg)  12/21/19 173 lb 12.8 oz (78.8 kg)    Physical Exam Vitals and  nursing note reviewed.  Constitutional:      General: She is not in acute distress.    Appearance: Normal appearance. She is well-developed and well-groomed. She is obese. She is not ill-appearing, toxic-appearing or diaphoretic.  HENT:     Head: Normocephalic and atraumatic.     Jaw: There is normal jaw occlusion.     Right Ear: Hearing normal.     Left Ear: Hearing normal.     Nose: Nose normal.     Mouth/Throat:     Lips: Pink.     Mouth: Mucous membranes are moist.     Pharynx: Oropharynx is clear. Uvula midline.  Eyes:     General: Lids are normal.     Extraocular Movements: Extraocular movements intact.     Conjunctiva/sclera: Conjunctivae normal.     Pupils: Pupils are equal, round, and reactive to light.  Neck:     Thyroid: No thyroid mass, thyromegaly or thyroid tenderness.  Vascular: No carotid bruit or JVD.     Trachea: Trachea and phonation normal.  Cardiovascular:     Rate and Rhythm: Normal rate and regular rhythm.     Chest Wall: PMI is not displaced.     Pulses: Normal pulses.     Heart sounds: Normal heart sounds. No murmur heard.   No friction rub. No gallop.  Pulmonary:     Effort: Pulmonary effort is normal. No respiratory distress.     Breath sounds: Normal breath sounds. No wheezing.  Abdominal:     General: Bowel sounds are normal. There is no distension or abdominal bruit.     Palpations: Abdomen is soft. There is no hepatomegaly or splenomegaly.     Tenderness: There is no abdominal tenderness. There is no right CVA tenderness or left CVA tenderness.     Hernia: No hernia is present.  Musculoskeletal:        General: Normal range of motion.     Cervical back: Normal range of motion and neck supple.     Right lower leg: No edema.     Left lower leg: No edema.  Lymphadenopathy:     Cervical: No cervical adenopathy.  Skin:    General: Skin is warm and dry.     Capillary Refill: Capillary refill takes less than 2 seconds.     Coloration: Skin is  not cyanotic, jaundiced or pale.     Findings: No rash.  Neurological:     General: No focal deficit present.     Mental Status: She is alert and oriented to person, place, and time.     GCS: GCS eye subscore is 4. GCS verbal subscore is 5. GCS motor subscore is 6.     Sensory: Sensation is intact.     Motor: Motor function is intact.     Coordination: Coordination is intact.     Gait: Gait is intact.     Deep Tendon Reflexes: Reflexes are normal and symmetric.  Psychiatric:        Attention and Perception: Attention and perception normal.        Mood and Affect: Mood and affect normal.        Speech: Speech normal.        Behavior: Behavior normal. Behavior is cooperative.        Thought Content: Thought content normal.        Cognition and Memory: Cognition normal. Memory is impaired. She exhibits impaired recent memory and impaired remote memory.        Judgment: Judgment normal.    Results for orders placed or performed in visit on 04/27/19  CBC with Differential (Cancer Center Only)  Result Value Ref Range   WBC Count 5.2 4.0 - 10.5 K/uL   RBC 3.07 (L) 3.87 - 5.11 MIL/uL   Hemoglobin 10.0 (L) 12.0 - 15.0 g/dL   HCT 29.7 (L) 36.0 - 46.0 %   MCV 96.7 80.0 - 100.0 fL   MCH 32.6 26.0 - 34.0 pg   MCHC 33.7 30.0 - 36.0 g/dL   RDW 14.1 11.5 - 15.5 %   Platelet Count 212 150 - 400 K/uL   nRBC 0.0 0.0 - 0.2 %   Neutrophils Relative % 59 %   Neutro Abs 3.1 1.7 - 7.7 K/uL   Lymphocytes Relative 25 %   Lymphs Abs 1.3 0.7 - 4.0 K/uL   Monocytes Relative 12 %   Monocytes Absolute 0.6 0.1 - 1.0 K/uL   Eosinophils  Relative 2 %   Eosinophils Absolute 0.1 0.0 - 0.5 K/uL   Basophils Relative 1 %   Basophils Absolute 0.0 0.0 - 0.1 K/uL   Immature Granulocytes 1 %   Abs Immature Granulocytes 0.03 0.00 - 0.07 K/uL  CMP (Cancer Center only)  Result Value Ref Range   Sodium 138 135 - 145 mmol/L   Potassium 4.2 3.5 - 5.1 mmol/L   Chloride 101 98 - 111 mmol/L   CO2 25 22 - 32 mmol/L    Glucose, Bld 99 70 - 99 mg/dL   BUN 7 (L) 8 - 23 mg/dL   Creatinine 0.86 0.44 - 1.00 mg/dL   Calcium 9.5 8.9 - 10.3 mg/dL   Total Protein 7.0 6.5 - 8.1 g/dL   Albumin 4.2 3.5 - 5.0 g/dL   AST 22 15 - 41 U/L   ALT 18 0 - 44 U/L   Alkaline Phosphatase 134 (H) 38 - 126 U/L   Total Bilirubin 0.6 0.3 - 1.2 mg/dL   GFR, Estimated >60 >60 mL/min   GFR, Est AFR Am >60 >60 mL/min   Anion gap 12 5 - 15  Sample to Blood Bank  Result Value Ref Range   Blood Bank Specimen SAMPLE AVAILABLE FOR TESTING    Sample Expiration      04/30/2019,2359 Performed at [facility], [place] [address] [place], [place] [address]        Pertinent labs & imaging results that were available during my care of the patient were reviewed by me and considered in my medical decision making.  Assessment & Plan:  Treina was seen today for new patient (initial visit).  Diagnoses and all orders for this visit:  Peripheral neuropathy due to chemotherapy [facility]) Issues with peripheral neuropathy post chemo and radiation therapy.  We will check vitamin B12 levels today.  Patient currently on gabapentin. -     Vitamin B12  Urge incontinence of urine Ongoing history of urge incontinence.  Will check urinalysis with culture for potential underlying infection.  If urinalysis and culture unremarkable, will increase dosing of Ditropan or change to Myrbetriq.  Kegel exercises discussed in detail. -     Urinalysis, Routine w reflex microscopic -     Urine Culture  Moderate major depression (HCC) GAD Ongoing and numbness.  Will check vitamin D and thyroid function.  If normal, will discuss changing medications. -     Thyroid Panel With TSH -     VITAMIN D 25 Hydroxy (Vit-D Deficiency, Fractures)  Vitamin D deficiency Currently not on repletion therapy, labs pending will initiate repletion therapy if warranted. -     VITAMIN D 25 Hydroxy (Vit-D Deficiency, Fractures)  Class 1 obesity due to excess  calories without serious comorbidity with body mass index (BMI) of 32.0 to 32.9 in adult And exercise encouraged.  Labs pending.  Follow-up in 3 months for BMI reevaluation. -     CMP14+EGFR -     CBC with Differential/Platelet -     Lipid panel -     Thyroid Panel With TSH  Memory deficit Ongoing and worsening per patient report.  Will refer to neurology for evaluation. -     Ambulatory referral to Neurology  Need for immunization against influenza -     Flu Vaccine QUAD High Dose(Fluad)  Patient new to office.  Baseline labs obtained today.  Patient to follow-up in 2 to 4 weeks, will adjust medication therapy at this time.  Continue all other maintenance  medications.  Follow up plan: Return in about 4 weeks (around 05/23/2021), or if symptoms worsen or fail to improve.   Continue healthy lifestyle choices, including diet (rich in fruits, vegetables, and lean proteins, and low in salt and simple carbohydrates) and exercise (at least 30 minutes of moderate physical activity daily).  Educational handout given for urinary incontinence  The above assessment and management plan was discussed with the patient. The patient verbalized understanding of and has agreed to the management plan. Patient is aware to call the clinic if they develop any new symptoms or if symptoms persist or worsen. Patient is aware when to return to the clinic for a follow-up visit. Patient educated on when it is appropriate to go to the emergency department.   Monia Pouch, FNP-C [facility] [phone]

## 2021-04-26 LAB — CMP14+EGFR
ALT: 17 IU/L (ref 0–32)
AST: 26 IU/L (ref 0–40)
Albumin/Globulin Ratio: 2.3 — ABNORMAL HIGH (ref 1.2–2.2)
Albumin: 4.6 g/dL (ref 3.7–4.7)
Alkaline Phosphatase: 201 IU/L — ABNORMAL HIGH (ref 44–121)
BUN/Creatinine Ratio: 12 (ref 12–28)
BUN: 12 mg/dL (ref 8–27)
Bilirubin Total: 0.3 mg/dL (ref 0.0–1.2)
CO2: 27 mmol/L (ref 20–29)
Calcium: 9.7 mg/dL (ref 8.7–10.3)
Chloride: 98 mmol/L (ref 96–106)
Creatinine, Ser: 1.03 mg/dL — ABNORMAL HIGH (ref 0.57–1.00)
Globulin, Total: 2 g/dL (ref 1.5–4.5)
Glucose: 93 mg/dL (ref 70–99)
Potassium: 5.2 mmol/L (ref 3.5–5.2)
Sodium: 137 mmol/L (ref 134–144)
Total Protein: 6.6 g/dL (ref 6.0–8.5)
eGFR: 56 mL/min/{1.73_m2} — ABNORMAL LOW (ref >59)

## 2021-04-26 LAB — CBC WITH DIFFERENTIAL/PLATELET
Basophils Absolute: 0.1 10*3/uL (ref 0.0–0.2)
Basos: 1 %
EOS (ABSOLUTE): 0.3 10*3/uL (ref 0.0–0.4)
Eos: 5 %
Hematocrit: 36.9 % (ref 34.0–46.6)
Hemoglobin: 12.3 g/dL (ref 11.1–15.9)
Immature Grans (Abs): 0 10*3/uL (ref 0.0–0.1)
Immature Granulocytes: 1 %
Lymphocytes Absolute: 1.3 10*3/uL (ref 0.7–3.1)
Lymphs: 24 %
MCH: 30.2 pg (ref 26.6–33.0)
MCHC: 33.3 g/dL (ref 31.5–35.7)
MCV: 91 fL (ref 79–97)
Monocytes Absolute: 0.6 10*3/uL (ref 0.1–0.9)
Monocytes: 11 %
Neutrophils Absolute: 3.1 10*3/uL (ref 1.4–7.0)
Neutrophils: 58 %
Platelets: 265 10*3/uL (ref 150–450)
RBC: 4.07 x10E6/uL (ref 3.77–5.28)
RDW: 12.5 % (ref 11.7–15.4)
WBC: 5.3 10*3/uL (ref 3.4–10.8)

## 2021-04-26 LAB — THYROID PANEL WITH TSH
Free Thyroxine Index: 1.6 (ref 1.2–4.9)
T3 Uptake Ratio: 24 % (ref 24–39)
T4, Total: 6.8 ug/dL (ref 4.5–12.0)
TSH: 2.68 u[IU]/mL (ref 0.450–4.500)

## 2021-04-26 LAB — LIPID PANEL
Chol/HDL Ratio: 3.7 ratio (ref 0.0–4.4)
Cholesterol, Total: 175 mg/dL (ref 100–199)
HDL: 47 mg/dL (ref >39)
LDL Chol Calc (NIH): 102 mg/dL — ABNORMAL HIGH (ref 0–99)
Triglycerides: 145 mg/dL (ref 0–149)
VLDL Cholesterol Cal: 26 mg/dL (ref 5–40)

## 2021-04-26 LAB — VITAMIN B12: Vitamin B-12: 500 pg/mL (ref 232–1245)

## 2021-04-26 LAB — VITAMIN D 25 HYDROXY (VIT D DEFICIENCY, FRACTURES): Vit D, 25-Hydroxy: 32.2 ng/mL (ref 30.0–100.0)

## 2021-05-07 IMAGING — XA IR PICC >5YO
1 series · 1 of 1 positions shown · non-contrast
Comparison: none

INDICATION: 73-year-old female with suspected bacterial seeding of her port
catheter and a continued need for chemotherapy. She presents for
port catheter removal and simultaneous PICC placement.

[Series 300: line placements · 1 of 1 slices shown]
[im 1/1]
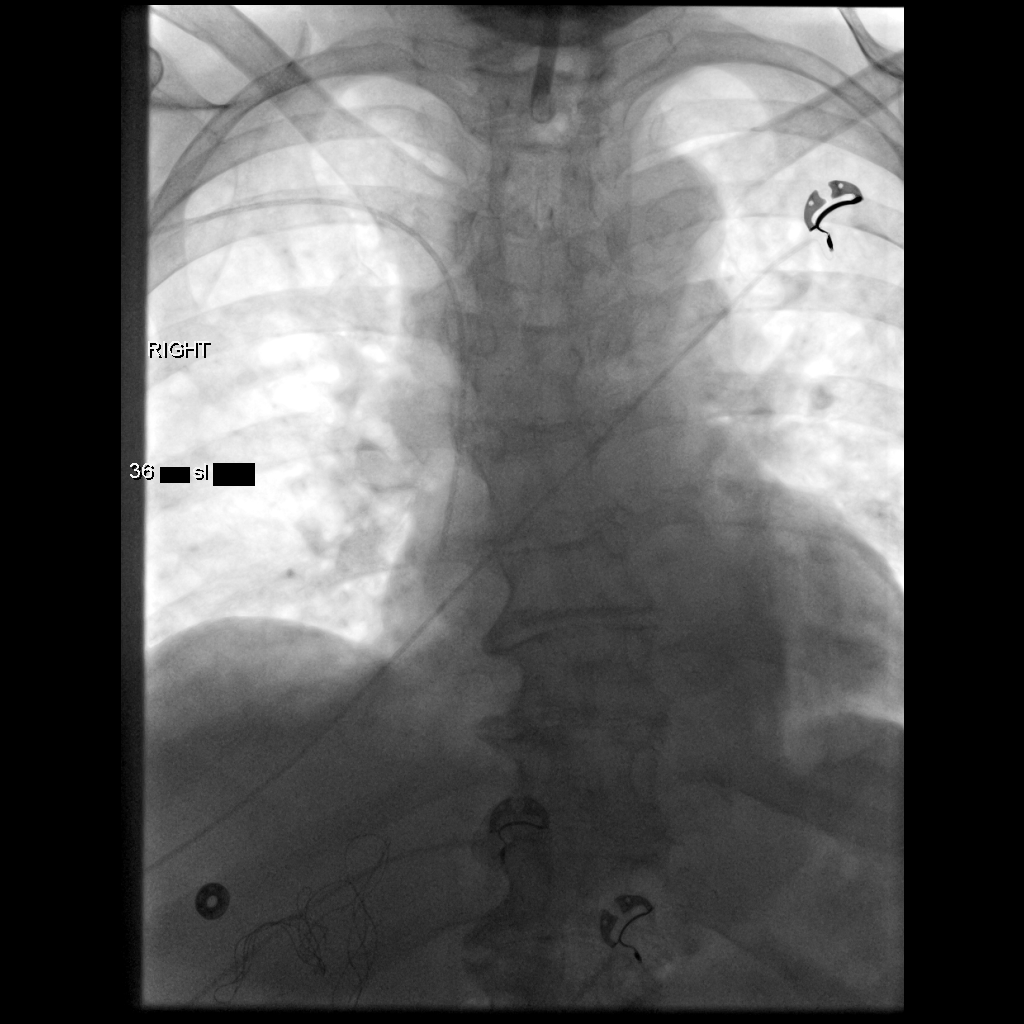

[1 of 1 positions shown; findings below may reference images not displayed]

EXAM:
PICC LINE PLACEMENT WITH ULTRASOUND AND FLUOROSCOPIC GUIDANCE

MEDICATIONS:
None.

ANESTHESIA/SEDATION:
None.

FLUOROSCOPY TIME:  Fluoroscopy Time: 0 minutes 12 seconds (3 mGy).

COMPLICATIONS:
None immediate.

PROCEDURE:
The patient was advised of the possible risks and complications and
agreed to undergo the procedure. The patient was then brought to the
angiographic suite for the procedure.

The right arm was prepped with chlorhexidine, draped in the usual
sterile fashion using maximum barrier technique (cap and mask,
sterile gown, sterile gloves, large sterile sheet, hand hygiene and
cutaneous antisepsis) and infiltrated locally with 1% Lidocaine.

Ultrasound demonstrated patency of the right brachial vein, and this
was documented with an image. Under real-time ultrasound guidance,
this vein was accessed with a 21 gauge micropuncture needle and
image documentation was performed. A [DATE] wire was introduced in to
the vein. Over this, a 5 French single lumen power-injectable PICC
was advanced to the lower SVC/right atrial junction. Fluoroscopy
during the procedure and fluoro spot radiograph confirms appropriate
catheter position. The catheter was flushed and covered with a
sterile dressing.

Catheter length: 36 cm
IMPRESSION: Successful right arm Power PICC line placement with ultrasound and
fluoroscopic guidance. The catheter is ready for use.

## 2021-05-23 ENCOUNTER — Ambulatory Visit (INDEPENDENT_AMBULATORY_CARE_PROVIDER_SITE_OTHER): Payer: PPO | Admitting: Family Medicine

## 2021-05-23 ENCOUNTER — Encounter: Payer: Self-pay | Admitting: Family Medicine

## 2021-05-23 VITALS — BP 109/67 | HR 84 | Temp 97.4°F | Ht 64.0 in | Wt 184.0 lb

## 2021-05-23 DIAGNOSIS — G620 Drug-induced polyneuropathy: Secondary | ICD-10-CM

## 2021-05-23 DIAGNOSIS — T451X5A Adverse effect of antineoplastic and immunosuppressive drugs, initial encounter: Secondary | ICD-10-CM | POA: Diagnosis not present

## 2021-05-23 DIAGNOSIS — R413 Other amnesia: Secondary | ICD-10-CM | POA: Diagnosis not present

## 2021-05-23 DIAGNOSIS — F419 Anxiety disorder, unspecified: Secondary | ICD-10-CM | POA: Diagnosis not present

## 2021-05-23 DIAGNOSIS — I700 Atherosclerosis of aorta: Secondary | ICD-10-CM

## 2021-05-23 DIAGNOSIS — N3941 Urge incontinence: Secondary | ICD-10-CM | POA: Diagnosis not present

## 2021-05-23 DIAGNOSIS — F321 Major depressive disorder, single episode, moderate: Secondary | ICD-10-CM

## 2021-05-23 HISTORY — DX: Atherosclerosis of aorta: I70.0

## 2021-05-23 MED ORDER — DESVENLAFAXINE SUCCINATE ER 50 MG PO TB24: 50.0000 mg | ORAL_TABLET | Freq: Every day | ORAL | 1 refills | Status: DC

## 2021-05-23 MED ORDER — OXYBUTYNIN CHLORIDE ER 15 MG PO TB24: 15.0000 mg | ORAL_TABLET | Freq: Every day | ORAL | 1 refills | Status: AC

## 2021-05-23 NOTE — Patient Instructions (Signed)

## 2021-05-23 NOTE — Progress Notes (Signed)
Subjective:  Patient ID: Zoe Kim, female    DOB: 1946-03-15, 75 y.o.   MRN: [id number]  Patient Care Team: Baruch Gouty, FNP as PCP - General (Family Medicine) Sueanne Margarita, MD as PCP - Cardiology (Cardiology) Calvert Cantor, MD as Consulting Physician (Ophthalmology)   Chief Complaint:  Depression, Urinary Incontinence, and Peripheral Neuropathy   HPI: Zoe Kim is a 76 y.o. female presenting on 05/23/2021 for Depression, Urinary Incontinence, and Peripheral Neuropathy   Patient presents today for follow-up of urge incontinence.  She has been taking Ditropan 10 mg without complete relief of symptoms.  It appears that the Ditropan prescribed is immediate release and extended release.  She denies any urinary tract infection symptoms or vaginal discharge/discomfort. She is also following up for ongoing depression and anxiety.  She does not feel that her depression and anxiety has worsened or improved.  She denies any HI/SI.  She has been taking her Pristiq as prescribed without adverse side effects.  She has been inactive since her move to [facility] and feels this may be contributory to her increased depressive symptoms.  She has ongoing peripheral neuropathy post chemotherapy.  She also has some memory deficits.  A referral to neurology was placed, patient has not received an appointment.  Depression screen [facility] 2/9 05/23/2021 04/25/2021 03/25/2019 02/25/2019 09/10/2018  Decreased Interest 1 2 1  0 0  Down, Depressed, Hopeless 3 2 1  0 0  PHQ - 2 Score 4 4 2  0 0  Altered sleeping 0 2 1 - -  Tired, decreased energy 3 2 1  - -  Change in appetite 2 3 1  - -  Feeling bad or failure about yourself  3 2 1  - -  Trouble concentrating 3 0 1 - -  Moving slowly or fidgety/restless 1 0 - - -  Suicidal thoughts 0 0 - - -  PHQ-9 Score 16 13 7  - -  Difficult doing work/chores Very difficult Very difficult - - -  Some recent data might be hidden   GAD 7 : Generalized Anxiety  Score 05/23/2021 04/25/2021 08/27/2017  Nervous, Anxious, on Edge 1 1 2   Control/stop worrying 0 1 3  Worry too much - different things 0 1 3  Trouble relaxing 1 0 3  Restless 0 0 0  Easily annoyed or irritable 1 0 3  Afraid - awful might happen 0 0 3  Total GAD 7 Score 3 3 17   Anxiety Difficulty Not difficult at all - Very difficult        Relevant past medical, surgical, family, and social history reviewed and updated as indicated.  Allergies and medications reviewed and updated. Data reviewed: Chart in Epic.   Past Medical History:  Diagnosis Date   Anxiety    Arthritis    Depression    Endometrial ca [facility])    Family history of breast cancer    Family history of lung cancer    Heart murmur    "years ago"   Insomnia    Pneumonia    "long time ago"   Primary localized osteoarthrosis of the knee, right    Urinary urgency     Past Surgical History:  Procedure Laterality Date   COLONOSCOPY     EYE SURGERY Bilateral    cataract with lens   IR IMAGING GUIDED PORT INSERTION  11/06/2018   IR REMOVAL TUN ACCESS W/ PORT W/O FL MOD SED  02/13/2019   ROBOTIC ASSISTED TOTAL HYSTERECTOMY WITH BILATERAL  SALPINGO OOPHERECTOMY N/A 10/14/2018   Procedure: ROBOTIC ASSISTED TOTAL HYSTERECTOMY WITH BILATERAL SALPINGO OOPHORECTOMY;  Surgeon: Everitt Amber, MD;  Location: [facility];  Service: Gynecology;  Laterality: N/A;   SENTINEL NODE BIOPSY N/A 10/14/2018   Procedure: SENTINEL LYMPH NODE BIOPSY;  Surgeon: Everitt Amber, MD;  Location: [facility];  Service: Gynecology;  Laterality: N/A;   TONSILLECTOMY     TOOTH EXTRACTION Right 08/27/2017   TOTAL KNEE ARTHROPLASTY Right 07/30/2016   Procedure: TOTAL KNEE ARTHROPLASTY;  Surgeon: Elsie Saas, MD;  Location: [place];  Service: Orthopedics;  Laterality: Right;   VEIN LIGATION AND STRIPPING      Social History   Socioeconomic History   Marital status: Married    Spouse name: Not on file   Number of children: 0   Years of education: Not on file    Highest education level: Bachelor's degree (e.g., BA, AB, BS)  Occupational History   Occupation: retired Pharmacist, hospital  Tobacco Use   Smoking status: Never   Smokeless tobacco: Never   Tobacco comments:    smoked 2 weeks in college  Vaping Use   Vaping Use: Never used  Substance and Sexual Activity   Alcohol use: No   Drug use: No   Sexual activity: Not Currently  Other Topics Concern   Not on file  Social History Narrative   Not on file   Social Determinants of Health   Financial Resource Strain: Not on file  Food Insecurity: Not on file  Transportation Needs: Not on file  Physical Activity: Not on file  Stress: Not on file  Social Connections: Not on file  Intimate Partner Violence: Not on file    Outpatient Encounter Medications as of 05/23/2021  Medication Sig   desvenlafaxine (PRISTIQ) 50 MG 24 hr tablet Take 1 tablet (50 mg total) by mouth daily.   gabapentin (NEURONTIN) 300 MG capsule Take 300 mg by mouth at bedtime.   oxybutynin (DITROPAN XL) 15 MG 24 hr tablet Take 1 tablet (15 mg total) by mouth at bedtime.   [DISCONTINUED] Desvenlafaxine Succinate ER 25 MG TB24 Take 1 tablet by mouth daily.   [DISCONTINUED] oxybutynin (DITROPAN) 5 MG tablet Take 5 mg by mouth 2 (two) times daily.   No facility-administered encounter medications on file as of 05/23/2021.    Allergies  Allergen Reactions   Sulfa Antibiotics Hives    UNSPECIFIED REACTION     Review of Systems  Constitutional:  Positive for activity change, appetite change and fatigue. Negative for chills, diaphoresis, fever and unexpected weight change.  Respiratory:  Negative for apnea, cough, choking, chest tightness, shortness of breath, wheezing and stridor.   Cardiovascular:  Negative for chest pain, palpitations and leg swelling.  Gastrointestinal:  Negative for abdominal pain.  Genitourinary:  Negative for decreased urine volume, difficulty urinating, vaginal bleeding, vaginal discharge and vaginal pain.        Urinary urge incontinence  Musculoskeletal:  Positive for myalgias. Negative for arthralgias, back pain, gait problem, joint swelling, neck pain and neck stiffness.  Neurological:  Negative for dizziness, tremors, seizures, syncope, facial asymmetry, speech difficulty, weakness, light-headedness, numbness and headaches.       Peripheral neuropathy  Psychiatric/Behavioral:  Positive for agitation, confusion, decreased concentration, dysphoric mood and sleep disturbance. Negative for behavioral problems, hallucinations, self-injury and suicidal ideas. The patient is nervous/anxious. The patient is not hyperactive.   All other systems reviewed and are negative.      Objective:  BP 109/67    Pulse 84  Temp (!) 97.4 F (36.3 C) (Temporal)    Ht $R'5\' 4"'OY$  (1.626 m)    Wt 184 lb (83.5 kg)    SpO2 98%    BMI 31.58 kg/m    Wt Readings from Last 3 Encounters:  05/23/21 184 lb (83.5 kg)  04/25/21 187 lb (84.8 kg)  03/24/20 172 lb 3.2 oz (78.1 kg)    Physical Exam Vitals and nursing note reviewed.  Constitutional:      General: She is not in acute distress.    Appearance: Normal appearance. She is well-developed and well-groomed. She is obese. She is not ill-appearing, toxic-appearing or diaphoretic.  HENT:     Head: Normocephalic and atraumatic.     Jaw: There is normal jaw occlusion.     Right Ear: Hearing normal.     Left Ear: Hearing normal.     Nose: Nose normal.     Mouth/Throat:     Lips: Pink.     Mouth: Mucous membranes are moist.     Pharynx: Oropharynx is clear. Uvula midline.  Eyes:     General: Lids are normal.     Extraocular Movements: Extraocular movements intact.     Conjunctiva/sclera: Conjunctivae normal.     Pupils: Pupils are equal, round, and reactive to light.  Neck:     Thyroid: No thyroid mass, thyromegaly or thyroid tenderness.     Vascular: No carotid bruit or JVD.     Trachea: Trachea and phonation normal.  Cardiovascular:     Rate and Rhythm: Normal  rate and regular rhythm.     Chest Wall: PMI is not displaced.     Pulses: Normal pulses.     Heart sounds: Normal heart sounds. No murmur heard.   No friction rub. No gallop.  Pulmonary:     Effort: Pulmonary effort is normal. No respiratory distress.     Breath sounds: Normal breath sounds. No wheezing.  Abdominal:     General: Bowel sounds are normal. There is no distension or abdominal bruit.     Palpations: Abdomen is soft. There is no hepatomegaly or splenomegaly.     Tenderness: There is no abdominal tenderness. There is no right CVA tenderness or left CVA tenderness.     Hernia: No hernia is present.  Musculoskeletal:        General: Normal range of motion.     Cervical back: Normal range of motion and neck supple.     Right lower leg: No edema.     Left lower leg: No edema.  Lymphadenopathy:     Cervical: No cervical adenopathy.  Skin:    General: Skin is warm and dry.     Capillary Refill: Capillary refill takes less than 2 seconds.     Coloration: Skin is not cyanotic, jaundiced or pale.     Findings: No rash.  Neurological:     General: No focal deficit present.     Mental Status: She is alert and oriented to person, place, and time.     Sensory: Sensation is intact.     Motor: Motor function is intact.     Coordination: Coordination is intact.     Gait: Gait is intact.     Deep Tendon Reflexes: Reflexes are normal and symmetric.  Psychiatric:        Attention and Perception: Attention and perception normal.        Mood and Affect: Mood and affect normal.        Speech: Speech normal.  Behavior: Behavior normal. Behavior is cooperative.        Thought Content: Thought content normal.        Cognition and Memory: Cognition normal. Memory is impaired. She exhibits impaired recent memory and impaired remote memory.        Judgment: Judgment normal.    Results for orders placed or performed in visit on 04/25/21  CMP14+EGFR  Result Value Ref Range   Glucose  93 70 - 99 mg/dL   BUN 12 8 - 27 mg/dL   Creatinine, Ser 1.03 (H) 0.57 - 1.00 mg/dL   eGFR 56 (L) >59 mL/min/1.73   BUN/Creatinine Ratio 12 12 - 28   Sodium 137 134 - 144 mmol/L   Potassium 5.2 3.5 - 5.2 mmol/L   Chloride 98 96 - 106 mmol/L   CO2 27 20 - 29 mmol/L   Calcium 9.7 8.7 - 10.3 mg/dL   Total Protein 6.6 6.0 - 8.5 g/dL   Albumin 4.6 3.7 - 4.7 g/dL   Globulin, Total 2.0 1.5 - 4.5 g/dL   Albumin/Globulin Ratio 2.3 (H) 1.2 - 2.2   Bilirubin Total 0.3 0.0 - 1.2 mg/dL   Alkaline Phosphatase 201 (H) 44 - 121 IU/L   AST 26 0 - 40 IU/L   ALT 17 0 - 32 IU/L  CBC with Differential/Platelet  Result Value Ref Range   WBC 5.3 3.4 - 10.8 x10E3/uL   RBC 4.07 3.77 - 5.28 x10E6/uL   Hemoglobin 12.3 11.1 - 15.9 g/dL   Hematocrit 36.9 34.0 - 46.6 %   MCV 91 79 - 97 fL   MCH 30.2 26.6 - 33.0 pg   MCHC 33.3 31.5 - 35.7 g/dL   RDW 12.5 11.7 - 15.4 %   Platelets 265 150 - 450 x10E3/uL   Neutrophils 58 Not Estab. %   Lymphs 24 Not Estab. %   Monocytes 11 Not Estab. %   Eos 5 Not Estab. %   Basos 1 Not Estab. %   Neutrophils Absolute 3.1 1.4 - 7.0 x10E3/uL   Lymphocytes Absolute 1.3 0.7 - 3.1 x10E3/uL   Monocytes Absolute 0.6 0.1 - 0.9 x10E3/uL   EOS (ABSOLUTE) 0.3 0.0 - 0.4 x10E3/uL   Basophils Absolute 0.1 0.0 - 0.2 x10E3/uL   Immature Granulocytes 1 Not Estab. %   Immature Grans (Abs) 0.0 0.0 - 0.1 x10E3/uL  Lipid panel  Result Value Ref Range   Cholesterol, Total 175 100 - 199 mg/dL   Triglycerides 145 0 - 149 mg/dL   HDL 47 >39 mg/dL   VLDL Cholesterol Cal 26 5 - 40 mg/dL   LDL Chol Calc (NIH) 102 (H) 0 - 99 mg/dL   Chol/HDL Ratio 3.7 0.0 - 4.4 ratio  Thyroid Panel With TSH  Result Value Ref Range   TSH 2.680 0.450 - 4.500 uIU/mL   T4, Total 6.8 4.5 - 12.0 ug/dL   T3 Uptake Ratio 24 24 - 39 %   Free Thyroxine Index 1.6 1.2 - 4.9  Vitamin B12  Result Value Ref Range   Vitamin B-12 500 232 - 1,245 pg/mL  VITAMIN D 25 Hydroxy (Vit-D Deficiency, Fractures)  Result Value  Ref Range   Vit D, 25-Hydroxy 32.2 30.0 - 100.0 ng/mL       Pertinent labs & imaging results that were available during my care of the patient were reviewed by me and considered in my medical decision making.  Assessment & Plan:  Mirela was seen today for depression, urinary incontinence and peripheral neuropathy.  Diagnoses and all orders for this visit:  Moderate major depression (HCC) Anxiety Ongoing symptoms.  Not SI or HI.  Will increase Pristiq to 50 mg daily.  Patient aware to report any new, worsening, persistent symptoms.  Follow-up in 3 months for reevaluation, or sooner if warranted. -     desvenlafaxine (PRISTIQ) 50 MG 24 hr tablet; Take 1 tablet (50 mg total) by mouth daily.  Urge incontinence of urine Ongoing symptoms despite current therapy.  Was on immediate release 10 mg Ditropan once daily.  Will increase dosing to 15 mg daily and change to extended release.  If symptoms persist, will refer to urogynecology. -     oxybutynin (DITROPAN XL) 15 MG 24 hr tablet; Take 1 tablet (15 mg total) by mouth at bedtime.  Atherosclerosis of aorta [facility]) Patient declines statin therapy.  Agrees to 81 mg aspirin daily.  Peripheral neuropathy due to chemotherapy [facility]) Well-controlled with current gabapentin dosing.  Referral to neurology pending.  Memory deficit Ongoing symptoms, likely due to chemotherapy.  Has not received appoint with neurology.  New referral placed today. -     Ambulatory referral to Neurology     Continue all other maintenance medications.  Follow up plan: Return in about 3 months (around 08/21/2021), or if symptoms worsen or fail to improve, for chronic follow-up.   Continue healthy lifestyle choices, including diet (rich in fruits, vegetables, and lean proteins, and low in salt and simple carbohydrates) and exercise (at least 30 minutes of moderate physical activity daily).  Educational handout given for depression  The above assessment and  management plan was discussed with the patient. The patient verbalized understanding of and has agreed to the management plan. Patient is aware to call the clinic if they develop any new symptoms or if symptoms persist or worsen. Patient is aware when to return to the clinic for a follow-up visit. Patient educated on when it is appropriate to go to the emergency department.   Monia Pouch, FNP-C [facility] [phone]

## 2021-06-16 ENCOUNTER — Telehealth: Payer: Self-pay | Admitting: Family Medicine

## 2021-06-16 NOTE — Telephone Encounter (Signed)
Left message for patient to call back and schedule Medicare Annual Wellness Visit (AWV) to be completed by video or phone.   Last AWV: 05/30/2017  Please schedule at anytime with [place]  45 minute appointment  Any questions, please contact me at [phone]

## 2021-07-11 ENCOUNTER — Telehealth: Payer: Self-pay | Admitting: Family Medicine

## 2021-07-11 NOTE — Telephone Encounter (Signed)
Left message for patient to call back and schedule Medicare Annual Wellness Visit (AWV) to be completed by video or phone.   Last AWV: 05/30/2017  Please schedule at anytime with [place]  45 minute appointment  Any questions, please contact me at [phone]

## 2021-08-07 ENCOUNTER — Telehealth: Payer: Self-pay

## 2021-08-07 NOTE — Telephone Encounter (Signed)
Left message for patient to call back and schedule Medicare Annual Wellness Visit (AWV) to be completed by video or phone.  ? ?Last AWV: 05/30/2017 ? ?Please schedule at anytime with [place] ? ?45 minute appointment ? ?Any questions, please contact me at [phone]  ?  ?  ?

## 2021-08-15 DIAGNOSIS — R413 Other amnesia: Secondary | ICD-10-CM | POA: Diagnosis not present

## 2021-08-15 DIAGNOSIS — R635 Abnormal weight gain: Secondary | ICD-10-CM | POA: Diagnosis not present

## 2021-08-15 DIAGNOSIS — G629 Polyneuropathy, unspecified: Secondary | ICD-10-CM | POA: Diagnosis not present

## 2021-08-24 DIAGNOSIS — H532 Diplopia: Secondary | ICD-10-CM | POA: Diagnosis not present

## 2021-08-24 DIAGNOSIS — Z79899 Other long term (current) drug therapy: Secondary | ICD-10-CM | POA: Diagnosis not present

## 2021-08-24 DIAGNOSIS — N3941 Urge incontinence: Secondary | ICD-10-CM | POA: Diagnosis not present

## 2021-08-24 DIAGNOSIS — G629 Polyneuropathy, unspecified: Secondary | ICD-10-CM | POA: Diagnosis not present

## 2021-08-24 DIAGNOSIS — R413 Other amnesia: Secondary | ICD-10-CM | POA: Diagnosis not present

## 2021-08-25 ENCOUNTER — Other Ambulatory Visit: Payer: Self-pay | Admitting: Family Medicine

## 2021-08-25 DIAGNOSIS — H532 Diplopia: Secondary | ICD-10-CM

## 2021-08-25 DIAGNOSIS — R413 Other amnesia: Secondary | ICD-10-CM

## 2021-08-29 ENCOUNTER — Ambulatory Visit: Payer: PPO | Admitting: Family Medicine

## 2021-08-30 ENCOUNTER — Other Ambulatory Visit (INDEPENDENT_AMBULATORY_CARE_PROVIDER_SITE_OTHER): Payer: PPO

## 2021-08-30 ENCOUNTER — Ambulatory Visit: Payer: PPO | Admitting: Physician Assistant

## 2021-08-30 ENCOUNTER — Encounter: Payer: Self-pay | Admitting: Physician Assistant

## 2021-08-30 ENCOUNTER — Other Ambulatory Visit: Payer: Self-pay

## 2021-08-30 VITALS — BP 143/83 | HR 84 | Resp 20 | Ht 65.0 in | Wt 188.0 lb

## 2021-08-30 DIAGNOSIS — F4321 Adjustment disorder with depressed mood: Secondary | ICD-10-CM | POA: Diagnosis not present

## 2021-08-30 DIAGNOSIS — G3184 Mild cognitive impairment, so stated: Secondary | ICD-10-CM

## 2021-08-30 DIAGNOSIS — R413 Other amnesia: Secondary | ICD-10-CM | POA: Diagnosis not present

## 2021-08-30 HISTORY — DX: Mild cognitive impairment of uncertain or unknown etiology: G31.84

## 2021-08-30 LAB — TSH: TSH: 1.98 u[IU]/mL (ref 0.35–5.50)

## 2021-08-30 LAB — VITAMIN B12: Vitamin B-12: 302 pg/mL (ref 211–911)

## 2021-08-30 MED ORDER — DONEPEZIL HCL 5 MG PO TABS: ORAL_TABLET | ORAL | 11 refills | Status: DC

## 2021-08-30 NOTE — Progress Notes (Signed)
? ? ?Assessment/Plan:  ? ?Zoe Kim is a very pleasant 76 y.o. 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 MoCA today is 21/30, with deficiencies in delayed recall, language, and memory.  Work-up is in progress. ? ?Amnestic MCI, likely due to Alzheimer's disease ? ?MRI brain with/without contrast to assess for underlying structural abnormality and assess vascular load  ?Check B12, TSH ?Referral to [facility] for depression ?Start Donepezil 5 mg :Take half tablet (2.5 mg) daily for 2 weeks, then increase to the full tablet at '5mg'$  daily. Side effects discussed   ?Discussed safety both in and out of the home.  ?Discussed the importance of regular daily schedule to maintain brain function.  ?Continue to monitor mood with PCP.  ?Monitor Driving ?Stay active at least 30 minutes at least 3 times a week.  ?Naps should be scheduled and should be no longer than 60 minutes and should not occur after 2 PM.  ?Control cardiovascular risk factors  ?Mediterranean diet is recommended  ?Folllow up in 3 months ? ?Subjective:  ? ? ?The patient is seen in neurologic consultation at the request of [name], * for the evaluation of memory.  The patient is accompanied by her husband who supplements the history. ?This is a 76 y.o. year old RH  female who has had memory issues for about 1 year, worse over the last 6 months, when she began to notice that she was forgetting day-to-day chores, for example when to feed her horses, or keep appointments.  She describes it as a "vague feeling, I do not know how to describe it ".  She does repeat herself and asked the same questions.  She denies being disoriented when walking into her room or leaving objects in unusual places.  She has a  history of chemo-induced neuropathy, which at times gives her difficulty to ambulate due to numbness in both feet, but denies any unilateral weakness, or tingling.  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  She denies any wandering behavior.  She continues to drive without getting lost.  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  She feels that she has no direction, and other than feeding the dogs and the horses, and attending exercise classes twice a week, and ballet bar 3 times a week, and riding her horses she does not participate in other activities to keep her busy.  She denies irritability.  She never sought counseling.  She enjoys doing crossword puzzles, word finding, board games, painting as well.  She used to sleep less hours, but she began taking melatonin, and since her husband snores, there are no longer sleeping in the same bedroom, which helps, and now the sleep longer amount of time.  Her sleeping patterns however are irregular.  She denies any vivid dreams or sleepwalking.  She may talk in her sleep at times according to her husband.  She denies any hallucinations  or paranoia.  There are no hygiene concerns, although she is not interested in bathing since she has nowhere to go, she now takes a shower every 3 days.  She places the medications in a pillbox, she does not forget to take them.  Her husband and her are in charge of the finances.  Her appetite has been increased, denies trouble swallowing.  She does not cook.  She denies any headaches.  She has issues with her vision, stating that when turning her eyes to the left or to  the right, she may notice less acuity, or double vision at times.  She denies any dizziness.  She denies any tremors.  For the last 2 years, she has anosmia.  No history of seizures.  She has history of urge incontinence, denies constipation or diarrhea.  Denies alcohol or tobacco use.  Family history negative for Alzheimer's disease or any other type of dementia.  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  [place] [facility] talking  ? ?Lipid Panel  ?   ?Component Value Date/Time  ? CHOL 175 04/25/2021 0952  ? TRIG 145 04/25/2021 0952  ? HDL 47 04/25/2021 0952  ? CHOLHDL 3.7 04/25/2021 0952  ? [place] 102 (H) 04/25/2021 5035  ? LABVLDL 26 04/25/2021 0952  ?  ? ?  Latest Ref Rng & Units 04/25/2021  ?  9:52 AM 04/27/2019  ?  8:45 AM 03/26/2019  ?  8:22 AM  ?CBC  ?WBC 3.4 - 10.8 x10E3/uL 5.3   5.2   5.4    ?Hemoglobin 11.1 - 15.9 g/dL 12.3   10.0   9.2    ?Hematocrit 34.0 - 46.6 % 36.9   29.7   27.5    ?Platelets 150 - 450 x10E3/uL 265   212   217    ?  ? ?  Latest Ref Rng & Units 04/25/2021  ?  9:52 AM 04/27/2019  ?  8:45 AM 03/26/2019  ?  8:22 AM  ?CMP  ?Glucose 70 - 99 mg/dL 93   99   95    ?BUN 8 - 27 mg/dL '12   7   10    '$ ?Creatinine 0.57 - 1.00 mg/dL 1.03   0.86   0.78    ?Sodium 134 - 144 mmol/L 137   138   139    ?Potassium 3.5 - 5.2 mmol/L 5.2   4.2   4.4    ?Chloride 96 - 106 mmol/L 98   101   104    ?CO2 20 - 29 mmol/L '27   25   25    '$ ?Calcium 8.7 - 10.3 mg/dL 9.7   9.5   9.3    ?Total Protein 6.0 - 8.5 g/dL 6.6   7.0   7.1    ?Total Bilirubin 0.0 - 1.2 mg/dL 0.3   0.6   0.4    ?Alkaline Phos 44 - 121 IU/L 201   134   142    ?AST 0 - 40 IU/L '26   22   20    '$ ?ALT 0 - 32 IU/L '17   18   17    '$ ?  ?Allergies  ?Allergen Reactions  ? Sulfa Antibiotics Hives  ?  UNSPECIFIED REACTION   ? ? ?Current Outpatient Medications  ?Medication Instructions  ? desvenlafaxine (PRISTIQ) 50 mg, Oral, Daily  ? gabapentin (NEURONTIN) 400 mg, Oral,  Daily at  bedtime, 2 tabs po bid  ? oxybutynin (DITROPAN XL) 15 mg, Oral, Daily at bedtime  ? ? ? ?VITALS:   ?Vitals:  ? 08/30/21 0805  ?BP: (!) 143/83  ?Pulse: 84  ?Resp: 20  ?SpO2: 94%  ?Weight: 188 lb (85.3 kg)  ?Height: '5\' 5"'$  (1.651 m)  ? ? ?  05/23/2021  ?  9:43 AM 04/25/2021  ?  9:24 AM 03/25/2019  ?  2:31 PM 02/25/2019  ? 11:25 AM 09/10/2018  ?  8:44 AM  ?Depression screen PHQ 2/9  ?Decreased Interest '1 2 1 '$ 0 0  ?Down, Depressed, Hopeless '3 2 1 '$ 0 0  ?PHQ - 2 Score '4 4 2 '$ 0 0  ?Altered sleeping 0 2 1    ?Tired, decreased energy '3 2 1    '$ ?Change in appetite '2 3 1    '$ ?Feeling bad or failure about yourself  '3 2 1    '$ ?Trouble concentrating 3 0 1    ?Moving slowly or fidgety/restless 1 0     ?Suicidal thoughts 0 0     ?PHQ-9 Score '16 13 7    '$ ?Difficult doing work/chores Very difficult Very difficult     ? ? ?PHYSICAL EXAM  ? ?HEENT:  Normocephalic, atraumatic. The mucous membranes are moist. The superficial temporal arteries are without ropiness or tenderness. ?Cardiovascular: Regular rate and rhythm. ?Lungs: Clear to auscultation bilaterally. ?Neck: There are no carotid bruits noted bilaterally. ? ?NEUROLOGICAL: ? ?  08/30/2021  ?  8:00 AM  ?Montreal Cognitive Assessment   ?Visuospatial/ Executive (0/5) 3  ?Naming (0/3) 3  ?Attention: Read list of digits (0/2) 2  ?Attention: Read list of letters (0/1) 1  ?Attention: Serial 7 subtraction starting at 100 (0/3) 3  ?Language: Repeat phrase (0/2) 1  ?Language : Fluency (0/1) 0  ?Abstraction (0/2) 2  ?Delayed Recall (0/5) 0  ?Orientation (0/6) 6  ?Total 21  ?Adjusted Score (based on education) 21  ?  ?   ? View : No data to display.  ?  ?  ?  ?  ? ?Orientation:  Alert and oriented to person, place and time. No aphasia or dysarthria. Fund of knowledge is appropriate. Recent memory impaired and remote memory intact.  Attention and concentration are normal.  Able to name objects and repeat phrases. Delayed recall 0/5, although able to retrieve them after the first clue. ?Cranial  nerves: There is good facial symmetry. Extraocular muscles are intact and visual fields are full to confrontational testing. Speech is fluent and clear. Soft palate rises symmetrically and there is no

## 2021-08-30 NOTE — Patient Instructions (Addendum)
It was a pleasure to see you today at our office.  ? ?Recommendations: ? ?Neurocognitive evaluation at our office ?MRI of the brain, the radiology office will call you to arrange you appointment ?Check labs today ?Start Donepezil 5 mg :Take half tablet (2.5 mg) daily for 2 weeks, then increase to the full tablet at 5 mg daily.  ?Referral to Behavioral therapy for depression  ?Follow up in 3 months  ? ?RECOMMENDATIONS FOR ALL PATIENTS WITH MEMORY PROBLEMS: ?1. Continue to exercise (Recommend 30 minutes of walking everyday, or 3 hours every week) ?2. Increase social interactions - continue going to [place] and enjoy social gatherings with friends and family ?3. Eat healthy, avoid fried foods and eat more fruits and vegetables ?4. Maintain adequate blood pressure, blood sugar, and blood cholesterol level. Reducing the risk of stroke and cardiovascular disease also helps promoting better memory. ?5. Avoid stressful situations. Live a simple life and avoid aggravations. Organize your time and prepare for the next day in anticipation. ?6. Sleep well, avoid any interruptions of sleep and avoid any distractions in the bedroom that may interfere with adequate sleep quality ?7. Avoid sugar, avoid sweets as there is a strong link between excessive sugar intake, diabetes, and cognitive impairment ?We discussed the Mediterranean diet, which has been shown to help patients reduce the risk of progressive memory disorders and reduces cardiovascular risk. This includes eating fish, eat fruits and green leafy vegetables, nuts like almonds and hazelnuts, walnuts, and also use olive oil. Avoid fast foods and fried foods as much as possible. Avoid sweets and sugar as sugar use has been linked to worsening of memory function. ? ?There is always a concern of gradual progression of memory problems. If this is the case, then we may need to adjust level of care according to patient needs. Support, both to the patient and caregiver, should  then be put into place.  ? ? ? ? ?You have been referred for a neuropsychological evaluation (i.e., evaluation of memory and thinking abilities). Please bring someone with you to this appointment if possible, as it is helpful for the doctor to hear from both you and another adult who knows you well. Please bring eyeglasses and hearing aids if you wear them.  ?  ?The evaluation will take approximately 3 hours and has two parts: ?  ?The first part is a clinical interview with the neuropsychologist (Dr. Melvyn Novas or Dr. Nicole Kindred). During the interview, the neuropsychologist will speak with you and the individual you brought to the appointment.  ?  ?The second part of the evaluation is testing with the doctor's technician Hinton Dyer or Maudie Mercury). During the testing, the technician will ask you to remember different types of material, solve problems, and answer some questionnaires. Your family member will not be present for this portion of the evaluation. ?  ?Please note: We must reserve several hours of the neuropsychologist's time and the psychometrician's time for your evaluation appointment. As such, there is a No-Show fee of $100. If you are unable to attend any of your appointments, please contact our office as soon as possible to reschedule.  ? ? ?FALL PRECAUTIONS: Be cautious when walking. Scan the area for obstacles that may increase the risk of trips and falls. When getting up in the mornings, sit up at the edge of the bed for a few minutes before getting out of bed. Consider elevating the bed at the head end to avoid drop of blood pressure when getting up. Walk always in  a well-lit room (use night lights in the walls). Avoid area rugs or power cords from appliances in the middle of the walkways. Use a walker or a cane if necessary and consider physical therapy for balance exercise. Get your eyesight checked regularly. ? ?FINANCIAL OVERSIGHT: Supervision, especially oversight when making financial decisions or transactions is  also recommended. ? ?HOME SAFETY: Consider the safety of the kitchen when operating appliances like stoves, microwave oven, and blender. Consider having supervision and share cooking responsibilities until no longer able to participate in those. Accidents with firearms and other hazards in the house should be identified and addressed as well. ? ? ?ABILITY TO BE LEFT ALONE: If patient is unable to contact 911 operator, consider using LifeLine, or when the need is there, arrange for someone to stay with patients. Smoking is a fire hazard, consider supervision or cessation. Risk of wandering should be assessed by caregiver and if detected at any point, supervision and safe proof recommendations should be instituted. ? ?MEDICATION SUPERVISION: Inability to self-administer medication needs to be constantly addressed. Implement a mechanism to ensure safe administration of the medications. ? ? ?DRIVING: Regarding driving, in patients with progressive memory problems, driving will be impaired. We advise to have someone else do the driving if trouble finding directions or if minor accidents are reported. Independent driving assessment is available to determine safety of driving. ? ? ?If you are interested in the driving assessment, you can contact the following: ? ?[employer] in [place] ? ?[place] [phone] ? ?[facility] [phone] ? ?[facility] [phone] or [phone] ? ? ? ?Mediterranean Diet ?A Mediterranean diet refers to food and lifestyle choices that are based on the traditions of countries located on the [employer]. This way of eating has been shown to help prevent certain conditions and improve outcomes for people who have chronic diseases, like kidney disease and heart disease. ?What are tips for following this plan? ?Lifestyle  ?Cook and eat meals together with your family, when possible. ?Drink enough fluid to keep your urine  clear or pale yellow. ?Be physically active every day. This includes: ?Aerobic exercise like running or swimming. ?Leisure activities like gardening, walking, or housework. ?Get 7-8 hours of sleep each night. ?If recommended by your health care provider, drink red wine in moderation. This means 1 glass a day for nonpregnant women and 2 glasses a day for men. A glass of wine equals 5 oz (150 mL). ?Reading food labels  ?Check the serving size of packaged foods. For foods such as rice and pasta, the serving size refers to the amount of cooked product, not dry. ?Check the total fat in packaged foods. Avoid foods that have saturated fat or trans fats. ?Check the ingredients list for added sugars, such as corn syrup. ?Shopping  ?At the grocery store, buy most of your food from the areas near the walls of the store. This includes: ?Fresh fruits and vegetables (produce). ?Grains, beans, nuts, and seeds. Some of these may be available in unpackaged forms or large amounts (in bulk). ?Fresh seafood. ?Poultry and eggs. ?Low-fat dairy products. ?Buy whole ingredients instead of prepackaged foods. ?Buy fresh fruits and vegetables in-season from local farmers markets. ?Buy frozen fruits and vegetables in resealable bags. ?If you do not have access to quality fresh seafood, buy precooked frozen shrimp or canned fish, such as tuna, salmon, or sardines. ?Buy small amounts of raw or cooked vegetables, salads, or olives from the deli or salad bar  at your store. ?Stock your pantry so you always have certain foods on hand, such as olive oil, canned tuna, canned tomatoes, rice, pasta, and beans. ?Cooking  ?Cook foods with extra-virgin olive oil instead of using butter or other vegetable oils. ?Have meat as a side dish, and have vegetables or grains as your main dish. This means having meat in small portions or adding small amounts of meat to foods like pasta or stew. ?Use beans or vegetables instead of meat in common dishes like chili or  lasagna. ?Experiment with different cooking methods. Try roasting or broiling vegetables instead of steaming or saut?eing them. ?Add frozen vegetables to soups, stews, pasta, or rice. ?Add nuts or seeds f

## 2021-10-20 DIAGNOSIS — R413 Other amnesia: Secondary | ICD-10-CM | POA: Diagnosis not present

## 2021-10-20 DIAGNOSIS — Z79899 Other long term (current) drug therapy: Secondary | ICD-10-CM | POA: Diagnosis not present

## 2021-10-20 DIAGNOSIS — F321 Major depressive disorder, single episode, moderate: Secondary | ICD-10-CM | POA: Diagnosis not present

## 2021-10-20 DIAGNOSIS — Z0000 Encounter for general adult medical examination without abnormal findings: Secondary | ICD-10-CM | POA: Diagnosis not present

## 2021-10-20 DIAGNOSIS — G629 Polyneuropathy, unspecified: Secondary | ICD-10-CM | POA: Diagnosis not present

## 2021-10-20 DIAGNOSIS — R32 Unspecified urinary incontinence: Secondary | ICD-10-CM | POA: Diagnosis not present

## 2021-10-20 DIAGNOSIS — Z23 Encounter for immunization: Secondary | ICD-10-CM | POA: Diagnosis not present

## 2021-10-20 DIAGNOSIS — H9193 Unspecified hearing loss, bilateral: Secondary | ICD-10-CM | POA: Diagnosis not present

## 2021-10-20 DIAGNOSIS — F419 Anxiety disorder, unspecified: Secondary | ICD-10-CM | POA: Diagnosis not present

## 2021-10-20 DIAGNOSIS — N289 Disorder of kidney and ureter, unspecified: Secondary | ICD-10-CM | POA: Diagnosis not present

## 2021-10-20 DIAGNOSIS — H6123 Impacted cerumen, bilateral: Secondary | ICD-10-CM | POA: Diagnosis not present

## 2021-11-19 ENCOUNTER — Other Ambulatory Visit: Payer: Self-pay | Admitting: Family Medicine

## 2021-11-30 ENCOUNTER — Encounter: Payer: Self-pay | Admitting: Physician Assistant

## 2021-11-30 ENCOUNTER — Ambulatory Visit: Payer: PPO | Admitting: Physician Assistant

## 2021-11-30 VITALS — BP 129/75 | HR 76 | Ht 64.0 in | Wt 185.0 lb

## 2021-11-30 DIAGNOSIS — G3184 Mild cognitive impairment, so stated: Secondary | ICD-10-CM

## 2021-11-30 NOTE — Progress Notes (Signed)
Assessment/Plan:   Amnestic Mild Cognitive Impairment    Zoe Kim is a very pleasant 76 y.o. 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 MRI brain mot performed to this time.She is on donepezil 5 mg daily, tolerating well.    Recommendations:    Continue donepezil 5 mg daily Side effects were discussed Follow up in 6  months after neurocognitive testing  Recommend to proceed with MRI brain prior to the testing  Replenish B12  Recommend to proceed with MRI brain for structural information and to elaborate her vascular load.  Continue behavioral health    Case discussed with Dr. Delice Lesch who agrees with the plan   Subjective:   This patient is accompanied in the office by her  husband who supplements the history.  Previous records as well as any outside records available were reviewed prior to todays visit.  Patient was last seen at our office on  08/30/21  at which time her MoCA was 21/30  Patient is currently on donepezil 5 mg daily.    Any changes in memory since last visit?" There is a broader spectrum of what I can and I can't  remember , STM may be worse especially over the  last 4-6 weeks . She enjoys doing crossword  puzzles in the computer, card games and I do well in both of them. She is also active with horses, wishes she could ride them more frequently.   Patient lives with: Spouse who reports STM may be worse  repeats oneself? Endorsed  Disoriented when walking into a room?  Patient denies   Leaving objects in unusual places?  Patient denies   Ambulates  with difficulty?   Patient denies   Recent falls?  Patient denies   Any head injuries?  Patient denies   History of seizures?   Patient denies    Wandering behavior?  Patient denies   Patient drives?   No issues driving  Any mood changes ?  Patient denies  she  may become irritable, "has always been like that"-husband says  Any history of depression?: "I have bad days, every other day I am ready to take the world and sometimes I don't ".   Hallucinations?  Patient denies   Paranoia?  "She thinks that people hate her so she doesn't socialize "- HB says  Patient reports that he sleeps well without vivid dreams, REM behavior or sleepwalking. Some night I have amazing dreams.      History of sleep apnea?  Patient denies   Any hygiene concerns?  Patient denies   Independent of bathing and dressing?  Endorsed  Does the patient needs help with medications? Patient  in charge  Who is in charge of the finances?  Patient is in charge    Any changes in appetite?  Patient denies   Patient have trouble swallowing? Patient denies   Does the patient cook?  Patient denies   Any kitchen accidents such as leaving the stove on? Patient denies   Any headaches?  Patient denies   The double vision? Patient denies   Any focal numbness or tingling?  Patient denies   Chronic back pain Patient denies   Unilateral weakness?  Patient denies   Any tremors?  Patient  denies   Any history of anosmia?  Patient denies   Any incontinence of urine? She has a history of  urge incontinence, but better controlled with Kegel exercises  Any bowel dysfunction?   Patient denies     Initial visit 08/30/21 The patient is seen in neurologic consultation at the request of [name], * for the evaluation of memory.  The patient is accompanied by her husband who supplements the history. This is a 76 y.o. year old RH  female who has had memory issues for about 1 year, worse over the last 6 months, when she began to notice that she was forgetting day-to-day chores, for example when to feed her horses, or keep appointments.  She describes it as a "vague feeling, I do not  know how to describe it ".  She does repeat herself and asked the same questions.  She denies being disoriented when walking into her room or leaving objects in unusual places.  She has a history of chemo-induced neuropathy, which at times gives her difficulty to ambulate due to numbness in both feet, but denies any unilateral weakness, or tingling.  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  She denies any wandering behavior.  She continues to drive without getting lost.  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  She feels that she has no direction, and other than feeding the dogs and the horses, and attending exercise classes twice a week, and ballet bar 3 times a week, and riding her horses she does not participate in other activities to keep her busy.  She denies irritability.  She never sought counseling.  She enjoys doing crossword puzzles, word finding, board games, painting as well.  She used to sleep less hours, but she began taking melatonin, and since her husband snores, there are no longer sleeping in the same bedroom, which helps, and now the sleep longer amount of time.  Her sleeping patterns however are irregular.  She denies any vivid dreams or sleepwalking.  She may talk in her sleep at times according to her husband.  She denies any hallucinations or paranoia.  There are no hygiene concerns, although she is not interested in bathing since she has nowhere to go, she now takes a shower every 3 days.  She places the medications in a pillbox, she does not forget to take them.  Her husband and her are in charge of the finances.  Her  appetite has been increased, denies trouble swallowing.  She does not cook.  She denies any headaches.  She has issues with her vision, stating that when turning her eyes to the left or to the right, she may notice less acuity, or double vision at times.  She denies any dizziness.  She denies any tremors.  For the last 2 years, she has anosmia.  No history of seizures.  She has history of urge incontinence, denies constipation or diarrhea.  Denies alcohol or tobacco use.  Family history negative for Alzheimer's disease or any other type of dementia.  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    Past Medical History:  Diagnosis Date   Anxiety    Arthritis    Depression    Endometrial ca [facility])    Family history of breast cancer    Family history of lung cancer    Heart murmur    "years ago"   Insomnia    Pneumonia    "long time ago"   Primary localized osteoarthrosis of the knee, right    Urinary urgency      Past Surgical History:  Procedure Laterality Date   COLONOSCOPY     EYE SURGERY Bilateral    cataract with lens   IR IMAGING GUIDED PORT INSERTION  11/06/2018   IR REMOVAL TUN ACCESS W/ PORT W/O FL MOD SED  02/13/2019   ROBOTIC ASSISTED TOTAL HYSTERECTOMY WITH BILATERAL SALPINGO OOPHERECTOMY N/A 10/14/2018   Procedure: ROBOTIC ASSISTED TOTAL HYSTERECTOMY WITH BILATERAL SALPINGO OOPHORECTOMY;  Surgeon: Everitt Amber, MD;  Location: [facility];  Service: Gynecology;  Laterality: N/A;   SENTINEL NODE BIOPSY N/A 10/14/2018   Procedure: SENTINEL LYMPH NODE BIOPSY;  Surgeon: Everitt Amber, MD;  Location: [facility];  Service: Gynecology;  Laterality: N/A;   TONSILLECTOMY     TOOTH EXTRACTION Right 08/27/2017   TOTAL KNEE ARTHROPLASTY Right 07/30/2016   Procedure: TOTAL KNEE ARTHROPLASTY;  Surgeon: Elsie Saas, MD;  Location: [place];  Service: Orthopedics;  Laterality: Right;   VEIN LIGATION AND STRIPPING       PREVIOUS  MEDICATIONS:   CURRENT MEDICATIONS:  Outpatient Encounter Medications as of 11/30/2021  Medication Sig   desvenlafaxine (PRISTIQ) 50 MG 24 hr tablet Take 1 tablet (50 mg total) by mouth daily.   donepezil (ARICEPT) 5 MG tablet Take half tablet 2.5 mg daily for 2 weeks, then increase to 1 tab 5 mg  daily.   gabapentin (NEURONTIN) 300 MG capsule Take 400 mg by mouth at bedtime. 2 tabs po bid   oxybutynin (DITROPAN XL) 15 MG 24 hr tablet Take 15 mg by mouth at bedtime.   No facility-administered encounter medications on file as of 11/30/2021.     Objective:     PHYSICAL EXAMINATION:    VITALS:   Vitals:   11/30/21 1102  BP: 129/75  Pulse: 76  SpO2: 97%  Weight: 185 lb (83.9 kg)  Height: '5\' 4"'$  (1.626 m)    GEN:  The patient appears stated age and is in NAD. HEENT:  Normocephalic, atraumatic.   Neurological examination:  General: NAD, well-groomed, appears stated age. Orientation: The patient is alert. Oriented to person, place and date Cranial nerves: There is good facial symmetry.The speech is fluent and clear. No aphasia or dysarthria. Fund of knowledge is appropriate. Recent memory impaired and remote memory is normal.  Attention and concentration are normal.  Able to name objects and repeat phrases.  Hearing is intact to conversational tone.    Sensation: Sensation is intact to light touch throughout Motor: Strength is at least antigravity x4. Tremors: none  DTR's 2/4 in UE/LE      08/30/2021    8:00 AM  Montreal Cognitive Assessment   Visuospatial/ Executive (0/5) 3  Naming (0/3) 3  Attention: Read list of digits (0/2) 2  Attention: Read list of letters (0/1) 1  Attention: Serial 7 subtraction starting at 100 (0/3) 3  Language: Repeat phrase (0/2) 1  Language : Fluency (0/1) 0  Abstraction (0/2) 2  Delayed Recall (0/5) 0  Orientation (0/6) 6  Total 21  Adjusted  Score (based on education) 21        No data to display             Movement  examination: Tone: There is normal tone in the UE/LE Abnormal movements:  no tremor.  No myoclonus.  No asterixis.   Coordination:  There is no decremation with RAM's. Normal finger to nose  Gait and Station: The patient has no difficulty arising out of a deep-seated chair without the use of the hands. The patient's stride length is good.  Gait is cautious and narrow.   Thank you for allowing [place] the opportunity to participate in the care of this nice patient. Please do not hesitate to contact us for any questions or concerns.   Total time spent on today's visit was 31 minutes dedicated to this patient today, preparing to see patient, examining the patient, ordering tests and/or medications and counseling the patient, documenting clinical information in the EHR or other health record, independently interpreting results and communicating results to the patient/family, discussing treatment and goals, answering patient's questions and coordinating care.  Cc:  [name], MD  Sharene Butters 11/30/2021 11:25 AM

## 2021-11-30 NOTE — Patient Instructions (Signed)
It was a pleasure to see you today at our office.   Recommendations:  Follow up in 6  months after the neurocognitive testing  Proceed with MRI brain  Proceed with Behavioral Health  Replenish B12  Continue donepezil 5 mg daily   Whom to call:  Memory  decline, memory medications: Call our office [phone]   For psychiatric meds, mood meds: Please have your primary care physician manage these medications.   Counseling regarding caregiver distress, including caregiver depression, anxiety and issues regarding community resources, adult day care programs, adult living facilities, or memory care questions:   Feel free to contact [place], Social Worker at [phone]   For assessment of decision of mental capacity and competency:  Call Dr. Anthoney Harada, geriatric psychiatrist at [phone]  For guidance in geriatric dementia issues please call [facility] [phone]  For guidance regarding [facility] and if placement were needed at the facility, contact Arnell Asal, Social Worker tel: [phone]  If you have any severe symptoms of a stroke, or other severe issues such as confusion,severe chills or fever, etc call 911 or go to the ER as you may need to be evaluated further   Feel free to visit Facebook page " [email or web address]" for tips of how to care for people with memory problems.    Feel free to go to the following database for funded clinical studies conducted around the world: http://saunders.com/   https://www.triadclinicaltrials.com/     RECOMMENDATIONS FOR ALL PATIENTS WITH MEMORY PROBLEMS: 1. Continue to exercise (Recommend 30 minutes of walking everyday, or 3 hours every week) 2. Increase social interactions - continue going to [place] and enjoy social gatherings with friends and family 3. Eat healthy, avoid fried foods and eat more fruits and vegetables 4. Maintain adequate blood pressure, blood sugar, and blood  cholesterol level. Reducing the risk of stroke and cardiovascular disease also helps promoting better memory. 5. Avoid stressful situations. Live a simple life and avoid aggravations. Organize your time and prepare for the next day in anticipation. 6. Sleep well, avoid any interruptions of sleep and avoid any distractions in the bedroom that may interfere with adequate sleep quality 7. Avoid sugar, avoid sweets as there is a strong link between excessive sugar intake, diabetes, and cognitive impairment We discussed the Mediterranean diet, which has been shown to help patients reduce the risk of progressive memory disorders and reduces cardiovascular risk. This includes eating fish, eat fruits and green leafy vegetables, nuts like almonds and hazelnuts, walnuts, and also use olive oil. Avoid fast foods and fried foods as much as possible. Avoid sweets and sugar as sugar use has been linked to worsening of memory function.  There is always a concern of gradual progression of memory problems. If this is the case, then we may need to adjust level of care according to patient needs. Support, both to the patient and caregiver, should then be put into place.    FALL PRECAUTIONS: Be cautious when walking. Scan the area for obstacles that may increase the risk of trips and falls. When getting up in the mornings, sit up at the edge of the bed for a few minutes before getting out of bed. Consider elevating the bed at the head end to avoid drop of blood pressure when getting up. Walk always in a well-lit room (use night lights in the walls). Avoid area rugs or power cords from appliances in the middle of the walkways. Use a walker or a  cane if necessary and consider physical therapy for balance exercise. Get your eyesight checked regularly.  FINANCIAL OVERSIGHT: Supervision, especially oversight when making financial decisions or transactions is also recommended.  HOME SAFETY: Consider the safety of the kitchen  when operating appliances like stoves, microwave oven, and blender. Consider having supervision and share cooking responsibilities until no longer able to participate in those. Accidents with firearms and other hazards in the house should be identified and addressed as well.   ABILITY TO BE LEFT ALONE: If patient is unable to contact 911 operator, consider using Life[facility], or when the need is there, arrange for someone to stay with patients. Smoking is a fire hazard, consider supervision or cessation. Risk of wandering should be assessed by caregiver and if detected at any point, supervision and safe proof recommendations should be instituted.  MEDICATION SUPERVISION: Inability to self-administer medication needs to be constantly addressed. Implement a mechanism to ensure safe administration of the medications.   DRIVING: Regarding driving, in patients with progressive memory problems, driving will be impaired. We advise to have someone else do the driving if trouble finding directions or if minor accidents are reported. Independent driving assessment is available to determine safety of driving.   If you are interested in the driving assessment, you can contact the following:  [employer] in [place]  [place] [phone]  [place]  [facility] [phone] or [phone]

## 2022-01-16 ENCOUNTER — Encounter: Payer: Self-pay | Admitting: Psychology

## 2022-02-26 DIAGNOSIS — F321 Major depressive disorder, single episode, moderate: Secondary | ICD-10-CM | POA: Diagnosis not present

## 2022-02-26 DIAGNOSIS — G3184 Mild cognitive impairment, so stated: Secondary | ICD-10-CM | POA: Diagnosis not present

## 2022-02-26 DIAGNOSIS — R32 Unspecified urinary incontinence: Secondary | ICD-10-CM | POA: Diagnosis not present

## 2022-03-14 DIAGNOSIS — Z1231 Encounter for screening mammogram for malignant neoplasm of breast: Secondary | ICD-10-CM | POA: Diagnosis not present

## 2022-04-10 DIAGNOSIS — N3944 Nocturnal enuresis: Secondary | ICD-10-CM | POA: Diagnosis not present

## 2022-04-10 DIAGNOSIS — R350 Frequency of micturition: Secondary | ICD-10-CM | POA: Diagnosis not present

## 2022-04-10 DIAGNOSIS — N3941 Urge incontinence: Secondary | ICD-10-CM | POA: Diagnosis not present

## 2022-04-16 DIAGNOSIS — N3941 Urge incontinence: Secondary | ICD-10-CM | POA: Diagnosis not present

## 2022-05-08 ENCOUNTER — Encounter: Payer: PPO | Admitting: Psychology

## 2022-05-17 ENCOUNTER — Encounter: Payer: PPO | Admitting: Psychology

## 2022-06-01 ENCOUNTER — Encounter: Payer: Self-pay | Admitting: Physician Assistant

## 2022-06-01 ENCOUNTER — Ambulatory Visit: Payer: PPO | Admitting: Physician Assistant

## 2022-06-01 DIAGNOSIS — Z029 Encounter for administrative examinations, unspecified: Secondary | ICD-10-CM

## 2022-06-01 NOTE — Progress Notes (Deleted)
Assessment/Plan:   Memory Impairment   Zoe Kim is a very pleasant 76 y.o. RH female presenting today in follow-up for evaluation of memory loss. Patient is on ***  Patient has not had her MRI brain to date.    Recommendations:   Follow up in   months. Continue donepezil 5 mg daily. Side effects were discussed  Continue B12 supplement Continue behavioral health Patient is scheduled for Neuropsych evaluation on Jan 2024 for clarity of the diagnosis     Subjective:   This patient is accompanied in the office by ***  who supplements the history. Previous records as well as any outside records available were reviewed prior to todays visit.   Patient was last seen on ***11/30/21     Any changes in memory since last visit?  STM may be worse Cwp on the comp and card games repeats oneself?  Endorsed Disoriented when walking into a room?  Patient denies except occasionally not remembering what patient came to the room for ***  Leaving objects in unusual places?  Patient denies   Wandering behavior?   denies   Any personality changes since last visit?   denies   Any worsening depression?: denies   Hallucinations or paranoia?  denies   Seizures?   denies    Any sleep changes?  Denies  vivid dreams, REM behavior or sleepwalking   Sleep apnea?   denies   Any hygiene concerns?   denies   Independent of bathing and dressing?  Endorsed  Does the patient needs help with medications? is in charge *** Who is in charge of the finances?   is in charge   *** Any changes in appetite?  denies ***   Patient have trouble swallowing?  denies   Does the patient cook?  Any kitchen accidents such as leaving the stove on?   denies   Any headaches?    denies   Vision changes? denies Chronic back pain  denies   Ambulates with difficulty?    denies   Recent falls or head injuries?    denies     Unilateral weakness, numbness or tingling?   denies   Any tremors?  denies   Any anosmia?     denies   Any incontinence of urine?  Urge incontinence Any bowel dysfunction?  denies      Patient lives  *** Does the patient drive?***  Initial visit 08/30/21 The patient is seen in neurologic consultation at the request of [name], * for the evaluation of memory.  The patient is accompanied by her husband who supplements the history. This is a 76 y.o. year old RH  female who has had memory issues for about 1 year, worse over the last 6 months, when she began to notice that she was forgetting day-to-day chores, for example when to feed her horses, or keep appointments.  She describes it as a "vague feeling, I do not know how to describe it ".  She does repeat herself and asked the same questions.  She denies being disoriented when walking into her room or leaving objects in unusual places.  She has a history of chemo-induced neuropathy, which at times gives her difficulty to ambulate due to numbness in both feet, but denies any unilateral weakness, or tingling.  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  She denies any wandering behavior.  She continues to drive without getting lost.  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  She feels that she has no direction, and other than feeding the dogs and the horses, and attending exercise classes twice a week, and ballet bar 3 times a week, and riding her horses she does not participate in other activities to keep her busy.  She denies irritability.  She never sought counseling.  She enjoys doing crossword puzzles, word finding, board games, painting as well.  She used to sleep less  hours, but she began taking melatonin, and since her husband snores, there are no longer sleeping in the same bedroom, which helps, and now the sleep longer amount of time.  Her sleeping patterns however are irregular.  She denies any vivid dreams or sleepwalking.  She may talk in her sleep at times according to her husband.  She denies any hallucinations or paranoia.  There are no hygiene concerns, although she is not interested in bathing since she has nowhere to go, she now takes a shower every 3 days.  She places the medications in a pillbox, she does not forget to take them.  Her husband and her are in charge of the finances.  Her appetite has been increased, denies trouble swallowing.  She does not cook.  She denies any headaches.  She has issues with her vision, stating that when turning her eyes to the left or to the right, she may notice less acuity, or double vision at times.  She denies any dizziness.  She denies any tremors.  For the last 2 years, she has anosmia.  No history of seizures.  She has history of urge incontinence, denies constipation or diarrhea.  Denies alcohol or tobacco use.  Family history negative for Alzheimer's disease or any other type of dementia.  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   Past Medical History:  Diagnosis Date   Anxiety    Arthritis    Depression    Endometrial ca [facility])    Family history of breast cancer    Family history of lung cancer    Heart murmur    "years ago"   Insomnia    Pneumonia    "long time ago"   Primary localized osteoarthrosis of the knee, right    Urinary urgency      Past Surgical History:  Procedure Laterality Date   COLONOSCOPY     EYE SURGERY Bilateral    cataract with lens   IR IMAGING GUIDED PORT INSERTION  11/06/2018   IR REMOVAL TUN ACCESS W/ PORT W/O FL MOD SED  02/13/2019   ROBOTIC ASSISTED TOTAL HYSTERECTOMY WITH BILATERAL SALPINGO  OOPHERECTOMY N/A 10/14/2018   Procedure: ROBOTIC ASSISTED TOTAL HYSTERECTOMY WITH BILATERAL SALPINGO OOPHORECTOMY;  Surgeon: Everitt Amber, MD;  Location: [facility];  Service: Gynecology;  Laterality: N/A;   SENTINEL NODE BIOPSY N/A 10/14/2018   Procedure: SENTINEL LYMPH NODE BIOPSY;  Surgeon: Everitt Amber, MD;  Location: [facility];  Service: Gynecology;  Laterality: N/A;   TONSILLECTOMY     TOOTH EXTRACTION Right 08/27/2017   TOTAL KNEE ARTHROPLASTY Right 07/30/2016   Procedure: TOTAL KNEE ARTHROPLASTY;  Surgeon: Elsie Saas, MD;  Location: [facility];  Service: Orthopedics;  Laterality: Right;   VEIN LIGATION AND STRIPPING       PREVIOUS MEDICATIONS:   CURRENT MEDICATIONS:  Outpatient Encounter Medications as of 06/01/2022  Medication Sig   desvenlafaxine (PRISTIQ) 50 MG 24 hr tablet Take 1 tablet (50 mg total) by mouth daily.   donepezil (ARICEPT) 5 MG tablet Take half tablet 2.5 mg daily for 2 weeks, then increase to 1 tab 5 mg  daily.   gabapentin (NEURONTIN) 300 MG capsule Take 400 mg by mouth at bedtime. 2 tabs po bid   oxybutynin (DITROPAN XL) 15 MG 24 hr tablet Take 15 mg by mouth at bedtime.   No facility-administered encounter medications on file as of 06/01/2022.     Objective:     PHYSICAL EXAMINATION:    VITALS:  There were no vitals filed for this visit.  GEN:  The patient appears stated age and is in NAD. HEENT:  Normocephalic, atraumatic.   Neurological examination:  General: NAD, well-groomed, appears stated age. Orientation: The patient is alert. Oriented to person, place and date Cranial nerves: There is good facial symmetry.The speech is fluent and clear. No aphasia or dysarthria. Fund of knowledge is appropriate. Recent memory impaired and remote memory is normal.  Attention and concentration are normal.  Able to name objects and repeat phrases.  Hearing is intact to conversational tone.   Delayed recall *** Sensation: Sensation is intact to light touch  throughout Motor: Strength is at least antigravity x4. Tremors: none  DTR's 2/4 in UE/LE      08/30/2021    8:00 AM  Montreal Cognitive Assessment   Visuospatial/ Executive (0/5) 3  Naming (0/3) 3  Attention: Read list of digits (0/2) 2  Attention: Read list of letters (0/1) 1  Attention: Serial 7 subtraction starting at 100 (0/3) 3  Language: Repeat phrase (0/2) 1  Language : Fluency (0/1) 0  Abstraction (0/2) 2  Delayed Recall (0/5) 0  Orientation (0/6) 6  Total 21  Adjusted Score (based on education) 21        No data to display             Movement examination: Tone: There is normal tone in the UE/LE Abnormal movements:  no tremor.  No myoclonus.  No asterixis.   Coordination:  There is no decremation with RAM's. Normal finger to nose  Gait and Station: The patient has no difficulty arising out of a deep-seated chair without the use of the hands. The patient's stride length is good.  Gait is cautious and narrow.   Thank you for allowing [place] the opportunity to participate in the care of this nice patient. Please do not hesitate to contact us for any questions or concerns.   Total time spent on today's visit was *** minutes dedicated to this patient today, preparing to see patient, examining the patient, ordering tests and/or medications and counseling the patient, documenting clinical information in the EHR or other health record, independently interpreting results and communicating results to the patient/family, discussing treatment and goals, answering patient's questions and coordinating care.  Cc:  [name], MD  Sharene Butters 06/01/2022 12:52 PM

## 2022-06-08 DIAGNOSIS — N3944 Nocturnal enuresis: Secondary | ICD-10-CM | POA: Diagnosis not present

## 2022-06-08 DIAGNOSIS — R350 Frequency of micturition: Secondary | ICD-10-CM | POA: Diagnosis not present

## 2022-06-29 ENCOUNTER — Encounter: Payer: Self-pay | Admitting: Psychology

## 2022-06-29 DIAGNOSIS — D485 Neoplasm of uncertain behavior of skin: Secondary | ICD-10-CM | POA: Insufficient documentation

## 2022-07-02 ENCOUNTER — Encounter: Payer: Self-pay | Admitting: Psychology

## 2022-07-02 ENCOUNTER — Ambulatory Visit: Payer: PPO | Admitting: Psychology

## 2022-07-02 ENCOUNTER — Ambulatory Visit: Payer: PPO

## 2022-07-02 DIAGNOSIS — G3184 Mild cognitive impairment, so stated: Secondary | ICD-10-CM

## 2022-07-02 DIAGNOSIS — R4189 Other symptoms and signs involving cognitive functions and awareness: Secondary | ICD-10-CM

## 2022-07-02 NOTE — Progress Notes (Signed)
NEUROPSYCHOLOGICAL EVALUATION [facility] of Neurology  Date of Evaluation: July 02, 2022  Reason for Referral:   Zoe Kim is a 77 y.o. right-handed Caucasian female referred by Sharene Butters, PA-C, to characterize her current cognitive functioning and assist with diagnostic clarity and treatment planning in the context of subjective cognitive decline.   Assessment and Plan:   Clinical Impression(s): Zoe Kim pattern of performance is suggestive of a primary impairment surrounding essentially all aspects of learning and memory. An additional weakness was exhibited across semantic fluency. Performances were otherwise appropriate relative to age-matched peers. This includes processing speed, attention/concentration, executive functioning, safety/judgment, receptive language, phonemic fluency, confrontation naming, and visuospatial abilities. Zoe Kim denied difficulties completing instrumental activities of daily living (ADLs) independently. Her husband was present and in agreement. As such, given evidence for cognitive dysfunction described above, she meets criteria for a Mild Neurocognitive Disorder ("mild cognitive impairment") at the present time.  The etiology for ongoing memory impairment is unclear. With that being said, concerns for an underlying neurodegenerative illness, namely Alzheimer's disease, remain reasonable. Across memory testing, Zoe Kim did not show notable benefit from the repetition of information across learning trials, was fully amnestic (i.e, 0% retention) across 2/3 memory tasks, and generally performed poorly across recognition trials. Taken together, this suggests concern for rapid forgetting and an evolving storage impairment, both of which are the hallmark characteristics of this illness. Her additional weakness in semantic fluency would follow typical disease trajectory, adding to ongoing concern.  Confrontation naming and visuospatial abilities were appropriate, which could suggest this illness being in early stages if truly present. Continued medical monitoring will be important moving forward.   She does not display behavioral features of Lewy body disease, another more rare parkinsonian condition, or frontotemporal lobar degeneration. Test patterns also do not align with these presentations. There is no prior neuroimaging to review. As such, I cannot comment on any vascular contributions or other anatomical variables which might otherwise explain memory deficits.   Recommendations: A repeat neuropsychological evaluation in 12-18 months (or sooner if functional decline is noted) is recommended to assess the trajectory of future cognitive decline should it occur. This will also aid in future efforts towards improved diagnostic clarity.  It appears that a brain MRI has been ordered but Zoe Kim has not scheduled this scan as of yet. She is encouraged to complete this scan as this will allow for more information surrounding potential contributions for ongoing cognitive decline.   Zoe Kim has already been prescribed a medication aimed to address memory loss and concerns surrounding Alzheimer's disease (i.e., donepezil/Aricept). She is encouraged to continue taking this medication as prescribed. It is important to highlight that this medication has been shown to slow functional decline in some individuals. There is no current treatment which can stop or reverse cognitive decline when caused by a neurodegenerative illness.   As Zoe Kim reported minimal benefit from current mood-related medications, I would encourage her to discuss this directly with her prescribing physician to more optimally manage ongoing symptoms.   Performance across neurocognitive testing is not a strong predictor of an individual's safety operating a motor vehicle. Should her family wish to pursue a formalized  driving evaluation, they could reach out to the following agencies: [employer] in [place]: [phone] Driver Rehabilitative Services: [facility]: [place]: [phone] or [phone]  Should there be a progression of current deficits over time, she is unlikely to  regain any independent living skills lost. Therefore, it is recommended that she remain as involved as possible in all aspects of household chores, finances, and medication management, with supervision to ensure adequate performance. She will likely benefit from the establishment and maintenance of a routine in order to maximize functional abilities over time.  It will be important for Zoe Kim to have another person with her when in situations where she may need to process information, weigh the pros and cons of different options, and make decisions, in order to ensure that she fully understands and recalls all information to be considered.  Zoe Kim is encouraged to attend to lifestyle factors for brain health (e.g., regular physical exercise, good nutrition habits, regular participation in cognitively-stimulating activities, and general stress management techniques), which are likely to have benefits for both emotional adjustment and cognition. In fact, in addition to promoting good general health, regular exercise incorporating aerobic activities (e.g., brisk walking, jogging, cycling, etc.) has been demonstrated to be a very effective treatment for depression and stress, with similar efficacy rates to both antidepressant medication and psychotherapy. Optimal control of vascular risk factors (including safe cardiovascular exercise and adherence to dietary recommendations) is encouraged. Continued participation in activities which provide mental stimulation and social interaction is also recommended.   Important information should be provided to Zoe Kim in written  format in all instances. This information should be placed in a highly frequented and easily visible location within her home to promote recall. External strategies such as written notes in a consistently used memory journal, visual and nonverbal auditory cues such as a calendar on the refrigerator or appointments with alarm, such as on a cell phone, can also help maximize recall.  Because she shows better recall for structured information, she will likely understand and retain new information better if it is presented to her in a meaningful or well-organized manner at the outset, such as grouping items into meaningful categories or presenting information in an outlined, bulleted, or story format.   Review of Records:   Zoe Kim was seen by [facility] Sharene Butters, PA-C) on 08/30/2021 for an evaluation of memory loss. At that time, memory concerns had been present for the past year, seemingly worse during the prior six months. Examples included forgetting to perform day-to-day chores (e.g., when to feed her horses) and trouble recalling upcoming appointments. She also described being repetitive in conversation. ADLs were described as intact. Performance on a brief cognitive screening instrument (MOCA) was 21/30. Ultimately, Zoe Kim was referred for a comprehensive neuropsychological evaluation to characterize her cognitive abilities and to assist with diagnostic clarity and treatment planning.   A brain MRI had been ordered but not scheduled or completed at the time of the current evaluation. As such, no neuroimaging was available for review.   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   Anemia due to antineoplastic chemotherapy 12/24/2018   Arthritis    Atherosclerosis of aorta 05/23/2021   Chronic fatigue syndrome 04/25/2021   Decreased estrogen level 04/25/2021   Family history of breast cancer    Family history  of lung cancer    GAD (generalized anxiety disorder) 04/25/2021   Heart murmur    "years ago"   History of adenomatous polyp of colon 04/25/2021   History of endometrial cancer    Insomnia    Major depressive disorder 04/25/2021   Neoplasm of uncertain behavior of skin    Obesity  10/14/2018   Peripheral neuropathy due to chemotherapy 11/20/2018   Personal history of malignant neoplasm of other parts of uterus 04/25/2021   Pneumonia    "long time ago"   Primary localized osteoarthritis of right knee 07/30/2016   Urge incontinence of urine 04/25/2021   Vitamin D deficiency 04/25/2021    Past Surgical History:  Procedure Laterality Date   COLONOSCOPY     EYE SURGERY Bilateral    cataract with lens   IR IMAGING GUIDED PORT INSERTION  11/06/2018   IR REMOVAL TUN ACCESS W/ PORT W/O FL MOD SED  02/13/2019   ROBOTIC ASSISTED TOTAL HYSTERECTOMY WITH BILATERAL SALPINGO OOPHERECTOMY N/A 10/14/2018   Procedure: ROBOTIC ASSISTED TOTAL HYSTERECTOMY WITH BILATERAL SALPINGO OOPHORECTOMY;  Surgeon: Everitt Amber, MD;  Location: [facility];  Service: Gynecology;  Laterality: N/A;   SENTINEL NODE BIOPSY N/A 10/14/2018   Procedure: SENTINEL LYMPH NODE BIOPSY;  Surgeon: Everitt Amber, MD;  Location: [facility];  Service: Gynecology;  Laterality: N/A;   TONSILLECTOMY     TOOTH EXTRACTION Right 08/27/2017   TOTAL KNEE ARTHROPLASTY Right 07/30/2016   Procedure: TOTAL KNEE ARTHROPLASTY;  Surgeon: Elsie Saas, MD;  Location: [facility];  Service: Orthopedics;  Laterality: Right;   VEIN LIGATION AND STRIPPING      Current Outpatient Medications:    desvenlafaxine (PRISTIQ) 50 MG 24 hr tablet, Take 1 tablet (50 mg total) by mouth daily., Disp: 90 tablet, Rfl: 1   donepezil (ARICEPT) 5 MG tablet, Take half tablet 2.5 mg daily for 2 weeks, then increase to 1 tab 5 mg  daily., Disp: 30 tablet, Rfl: 11   gabapentin (NEURONTIN) 300 MG capsule, Take 400 mg by mouth at bedtime. 2 tabs po bid, Disp: , Rfl:    oxybutynin (DITROPAN XL)  15 MG 24 hr tablet, Take 15 mg by mouth at bedtime., Disp: , Rfl:   Clinical Interview:   The following information was obtained during a clinical interview with Zoe Kim and her husband prior to cognitive testing.  Cognitive Symptoms: Decreased short-term memory: Endorsed. Examples included trouble remembering to perform "simple" actions around the house, as well as trouble recalling details of recent conversations or names of individuals. She also reported being fairly repetitive in conversation. Her husband was in agreement. He provided another example surrounding Zoe Kim quickly forgetting recent movies she has watched. She was unable to describe a timeline surrounding memory concerns. Her husband described gradual, progressive decline over the past 3-4 years.  Decreased long-term memory: Denied. Decreased attention/concentration: Denied. Reduced processing speed: Unclear. Difficulties with executive functions: Endorsed. She described a lifelong and stable relative weakness surrounding organization and multi-tasking. She and her husband denied trouble with impulsivity. Her husband noted that she may become more strongly fixated on certain things in her environment now. Otherwise, no more severe personality changes were described.  Difficulties with emotion regulation: Denied. Difficulties with receptive language: Denied. Difficulties with word finding: Denied. Decreased visuoperceptual ability: Denied.  Difficulties completing ADLs: Denied.  Additional Medical History: History of traumatic brain injury/concussion: Endorsed. Around age 49, she reported falling after being tripped by a dog, ultimately hitting the front of her head on the cement ground. She described a loss in consciousness of unknown duration and a resultant skull fracture. Her husband also described a fall where she hit the same region on the cement ground around six years prior. No persisting difficulties were  described. No more recent falls were reported.  History of stroke: Denied. History of seizure activity: Denied. History  of known exposure to toxins: Denied. Symptoms of chronic pain: Endorsed. She described neuropathy impacting her feet and lower extremities. Per medical records, this neuropathy is chemotherapy induced.  Experience of frequent headaches/migraines: Denied. Frequent instances of dizziness/vertigo: Denied.  Sensory changes: She reported symptoms of nearsightedness and seemingly mild hearing loss. Her husband reminded her of a diminished sense of both taste and smell. The latter symptoms were said to be more longstanding in nature with an unknown cause.  Balance/coordination difficulties: Denied. Other motor difficulties: Denied.  Sleep History: Estimated hours obtained each night: 6-8 hours.  Difficulties falling asleep: Denied. Difficulties staying asleep: Denied. Feels rested and refreshed upon awakening: Endorsed.  History of snoring: Denied. History of waking up gasping for air: Denied. Witnessed breath cessation while asleep: Denied.   History of vivid dreaming: Denied. Excessive movement while asleep: Denied. Instances of acting out her dreams: Denied.  Psychiatric/Behavioral Health History: Depression: Endorsed. She acknowledged a longstanding history of generally mild depressive symptoms throughout her life. She worked with an Haematologist throughout her 68s with modest benefit. She noted that current medication intervention did not seem particularly helpful. She described her current mood as "not very good," largely due to frustration surrounding perceived cognitive decline. Current or remote suicidal ideation, intent, or plan was denied.  Anxiety: Denied. Mania: Denied. Trauma History: Denied. Visual/auditory hallucinations: Denied. Delusional thoughts: Denied.  Tobacco: Denied. Alcohol: She denied current alcohol consumption as well as a history of  problematic alcohol abuse or dependence.  Recreational drugs: Denied.  Family History: Problem Relation Age of Onset   Heart disease Mother    COPD Mother    Cancer Mother        uterine (possibly, pt unsure)   Breast cancer Mother        dx in 43s   Dementia Mother        possible mini-strokes   Heart disease Father    COPD Brother    Heart disease Brother    Cancer Brother 51       lung ca, smoker   Cancer Maternal Grandmother        breast dx early 67s   Stroke Maternal Grandfather    This information was confirmed by Ms. Livingstone.  Academic/Vocational History: Highest level of educational attainment: 16 years. 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 She described herself as a good (A/B) student in academic settings. No relative weaknesses were identified.  History of developmental delay: Denied. History of grade repetition: Denied. Enrollment in special education courses: Denied. History of LD/ADHD: Denied.  Employment: Retired. 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   Evaluation Results:   Behavioral Observations: Zoe Kim was accompanied by her husband, arrived to her appointment on time, and was appropriately dressed and groomed. She appeared alert and oriented. Observed gait and station were within normal limits. Gross motor functioning appeared intact upon informal observation and no abnormal movements (e.g., tremors) were noted. Her affect was generally relaxed and positive, but did range appropriately given the subject being discussed during the clinical interview or the task at hand during testing procedures. Spontaneous speech was fluent and word finding difficulties were not observed during the clinical interview. Thought processes were coherent,  organized, and normal in content. Insight into her cognitive difficulties appeared adequate.  During testing, she was somewhat tangential but generally able to be redirected by the psychometrist easily. Sustained attention was appropriate. Task engagement was adequate and she persisted when challenged. Overall, Ms. Ransier was cooperative with the clinical interview and subsequent testing procedures.   Adequacy of Effort: The validity of neuropsychological testing is limited by the extent to which the individual being tested may be assumed to have exerted adequate effort during testing. Ms. Benda expressed her intention to perform to the best of her abilities and exhibited adequate task engagement and persistence. Scores across stand-alone and embedded performance validity measures were within expectation. As such, the results of the current evaluation are believed to be a valid representation of Zoe Kim's current cognitive functioning.  Test Results: Zoe Kim was oriented at the time of the current evaluation. She was unable to name the current clinic.   Intellectual abilities based upon educational and vocational attainment were estimated to be in the average range. Premorbid abilities were estimated to be within the above average range based upon a single-word reading test.   Processing speed was below average to average. Basic attention was average to above average. More complex attention (e.g., working memory) was average. Executive functioning was largely below average to average. She did make several errors across a complex response inhibition task. She performed in the well above average range across a task assessing safety and judgment.   Assessed receptive language abilities were above average. Likewise, Zoe Kim did not exhibit any difficulties comprehending task instructions and answered all questions asked of her appropriately. Assessed expressive language was variable.  Phonemic fluency was below average to average, semantic fluency was exceptionally low to below average, and confrontation naming was below average to average.      Assessed visuospatial/visuoconstructional abilities were average.    Learning (i.e., encoding) of novel verbal information was exceptionally low to below average. Spontaneous delayed recall (i.e., retrieval) of previously learned information was exceptionally low to well below average. Retention rates were 50% across a story learning task, 0% across a list learning task, and 0% across a figure drawing task. Performance across recognition tasks was exceptionally low to below average, suggesting very limited evidence for information consolidation.   Results of emotional screening instruments suggested that recent symptoms of generalized anxiety were in the mild range, while symptoms of depression were also within the mild range. A screening instrument assessing recent sleep quality suggested the presence of minimal sleep dysfunction.  Tables of Scores:   Note: This summary of test scores accompanies the interpretive report and should not be considered in isolation without reference to the appropriate sections in the text. Descriptors are based on appropriate normative data and may be adjusted based on clinical judgment. Terms such as "Within Normal Limits" and "Outside Normal Limits" are used when a more specific description of the test score cannot be determined.       Percentile - Normative Descriptor > 98 - Exceptionally High 91-97 - Well Above Average 75-90 - Above Average 25-74 - Average 9-24 - Below Average 2-8 - Well Below Average < 2 - Exceptionally Low       Orientation:      Raw Score Percentile   NAB Orientation, Form 1 27/29 --- ---       Cognitive Screening:      Raw Score Percentile   SLUMS: 19/30 --- ---       RBANS, Form A: Standard Score/ Scaled Score Percentile   Total Score 75 5 Well  Below Average   Immediate Memory 69 2 Well Below Average    List Learning 2 <1 Exceptionally Low    Story Memory 7 16 Below Average  Visuospatial/Constructional 105 63 Average    Figure Copy 10 50 Average    Line Orientation 18/20 51-75 Average  Language 82 12 Below Average    Picture Naming 9/10 26-50 Average    Semantic Fluency 3 1 Exceptionally Low  Attention 97 42 Average    Digit Span 11 63 Average    Coding 8 25 Average  Delayed Memory 48 <1 Exceptionally Low    List Recall 0/10 <2 Exceptionally Low    List Recognition 14/20 <2 Exceptionally Low    Story Recall 5 5 Well Below Average    Story Recognition 7/12 5-7 Well Below Average    Figure Recall 1 <1 Exceptionally Low    Figure Recognition 4/8 9-20 Below Average        Intellectual Functioning:      Standard Score Percentile   Test of Premorbid Functioning: 118 88 Above Average       Attention/Executive Function:     Trail Making Test (TMT): Raw Score (T Score) Percentile     Part A 31 secs.,  0 errors (53) 62 Average    Part B 101 secs.,  2 errors (48) 42 Average         Scaled Score Percentile   WAIS-IV Digit Span: 11 63 Average    Forward 12 75 Above Average    Backward 10 50 Average    Sequencing 9 37 Average        Scaled Score Percentile   WAIS-IV Similarities: 11 63 Average       D-KEFS Color-Word Interference Test: Raw Score (Scaled Score) Percentile     Color Naming 44 secs. (6) 9 Below Average    Word Reading 23 secs. (11) 63 Average    Inhibition 72 secs. (10) 50 Average      Total Errors 4 errors (9) 37 Average    Inhibition/Switching 85 secs. (9) 37 Average      Total Errors 8 errors (5) 5 Well Below Average       D-KEFS Verbal Fluency Test: Raw Score (Scaled Score) Percentile     Letter Total Correct 29 (8) 25 Average    Category Total Correct 26 (7) 16 Below Average    Category Switching Total Correct 10 (8) 25 Average    Category Switching Accuracy 8 (7) 16 Below Average      Total Set Loss Errors 2  (10) 50 Average      Total Repetition Errors 2 (11) 63 Average       NAB Executive Functions Module, Form 1: T Score Percentile     Judgment 66 95 Well Above Average       Language:     Verbal Fluency Test: Raw Score (T Score) Percentile     Phonemic Fluency (FAS) 29 (40) 16 Below Average    Animal Fluency 12 (34) 5 Well Below Average        NAB Language Module, Form 1: T Score Percentile     Auditory Comprehension 57 75 Above Average    Naming 28/31 (40) 16 Below Average       Visuospatial/Visuoconstruction:      Raw Score Percentile   Clock Drawing: 9/10 --- Within Normal Limits        Scaled Score Percentile   WAIS-IV Block Design: 9 37 Average  Mood and Personality:      Raw Score Percentile   Geriatric Depression Scale: 18 --- Mild  Geriatric Anxiety Scale: 19 --- Mild    Somatic 2 --- Minimal    Cognitive 7 --- Moderate    Affective 10 --- Severe       Additional Questionnaires:      Raw Score Percentile   PROMIS Sleep Disturbance Questionnaire: 8 --- None to Slight   Informed Consent and Coding/Compliance:   The current evaluation represents a clinical evaluation for the purposes previously outlined by the referral source and is in no way reflective of a forensic evaluation.   Ms. Ventress was provided with a verbal description of the nature and purpose of the present neuropsychological evaluation. Also reviewed were the foreseeable risks and/or discomforts and benefits of the procedure, limits of confidentiality, and mandatory reporting requirements of this provider. The patient was given the opportunity to ask questions and receive answers about the evaluation. Oral consent to participate was provided by the patient.   This evaluation was conducted by Christia Reading, Ph.D., ABPP-CN, board certified clinical neuropsychologist. Ms. Resetar completed a clinical interview with Dr. Melvyn Novas, billed as one unit [phone], and 125 minutes of cognitive testing and scoring,  billed as one unit [phone] and three additional units [id number]. Psychometrist Cruzita Lederer, B.S., assisted Dr. Melvyn Novas with test administration and scoring procedures. As a separate and discrete service, Dr. Melvyn Novas spent a total of 160 minutes in interpretation and report writing billed as one unit [phone] and two units [id number].

## 2022-07-02 NOTE — Progress Notes (Signed)
   Psychometrician Note   Cognitive testing was administered to [employer] by Cruzita Lederer, B.S. (psychometrist) under the supervision of Dr. Christia Reading, Ph.D., licensed psychologist on 07/02/2022. Ms. Pomeroy did not appear overtly distressed by the testing session per behavioral observation or responses across self-report questionnaires. Rest breaks were offered.    The battery of tests administered was selected by Dr. Christia Reading, Ph.D. with consideration to Ms. Carpenter's current level of functioning, the nature of her symptoms, emotional and behavioral responses during interview, level of literacy, observed level of motivation/effort, and the nature of the referral question. This battery was communicated to the psychometrist. Communication between Dr. Christia Reading, Ph.D. and the psychometrist was ongoing throughout the evaluation and Dr. Christia Reading, Ph.D. was immediately accessible at all times. Dr. Christia Reading, Ph.D. provided supervision to the psychometrist on the date of this service to the extent necessary to assure the quality of all services provided.    Eryka H Scarber will return within approximately 1-2 weeks for an interactive feedback session with Dr. Melvyn Novas at which time her test performances, clinical impressions, and treatment recommendations will be reviewed in detail. Ms. Grandinetti understands she can contact our office should she require our assistance before this time.  A total of 125 minutes of billable time were spent face-to-face with Ms. Mcgillivray by the psychometrist. This includes both test administration and scoring time. Billing for these services is reflected in the clinical report generated by Dr. Christia Reading, Ph.D.  This note reflects time spent with the psychometrician and does not include test scores or any clinical interpretations made by Dr. Melvyn Novas. The full report will follow in a separate note.

## 2022-07-12 ENCOUNTER — Ambulatory Visit: Payer: PPO | Admitting: Psychology

## 2022-07-12 DIAGNOSIS — G3184 Mild cognitive impairment, so stated: Secondary | ICD-10-CM

## 2022-07-12 NOTE — Progress Notes (Signed)
   Neuropsychology Feedback Session [facility] of Neurology  Reason for Referral:   Zoe Kim is a 78 y.o. right-handed Caucasian female referred by Sharene Butters, PA-C, to characterize her current cognitive functioning and assist with diagnostic clarity and treatment planning in the context of subjective cognitive decline.   Feedback:   Zoe Kim completed a comprehensive neuropsychological evaluation on 07/02/2022. Please refer to that encounter for the full report and recommendations. Briefly, results suggested a primary impairment surrounding essentially all aspects of learning and memory. An additional weakness was exhibited across semantic fluency. Performances were otherwise appropriate relative to age-matched peers. The etiology for ongoing memory impairment is unclear. With that being said, concerns for an underlying neurodegenerative illness, namely Alzheimer's disease, remain reasonable. Across memory testing, Zoe Kim did not show notable benefit from the repetition of information across learning trials, was fully amnestic (i.e, 0% retention) across 2/3 memory tasks, and generally performed poorly across recognition trials. Taken together, this suggests concern for rapid forgetting and an evolving storage impairment, both of which are the hallmark characteristics of this illness. Her additional weakness in semantic fluency would follow typical disease trajectory, adding to ongoing concern. Confrontation naming and visuospatial abilities were appropriate, which could suggest this illness being in early stages if truly present. Continued medical monitoring will be important moving forward.   Zoe Kim was accompanied by her husband during the current feedback session. Content of the current session focused on the results of her neuropsychological evaluation. Zoe Kim was given the opportunity to ask questions and her questions  were answered. She was encouraged to reach out should additional questions arise. A copy of her report was provided at the conclusion of the visit.      25 minutes were spent conducting the current feedback session with Zoe Kim, billed as one unit [phone].

## 2022-07-18 DIAGNOSIS — R350 Frequency of micturition: Secondary | ICD-10-CM | POA: Diagnosis not present

## 2022-07-18 DIAGNOSIS — N3941 Urge incontinence: Secondary | ICD-10-CM | POA: Diagnosis not present

## 2022-07-19 ENCOUNTER — Encounter: Payer: Self-pay | Admitting: Physician Assistant

## 2022-07-19 ENCOUNTER — Ambulatory Visit: Payer: PPO | Admitting: Physician Assistant

## 2022-07-19 VITALS — BP 108/76 | HR 100 | Resp 20 | Wt 184.0 lb

## 2022-07-19 DIAGNOSIS — R413 Other amnesia: Secondary | ICD-10-CM | POA: Diagnosis not present

## 2022-07-19 NOTE — Patient Instructions (Addendum)
It was a pleasure to see you today at our office.   Recommendations:  Follow up in 6  months   Proceed with MRI brain  Continue donepezil 5 mg daily   Whom to call:  Memory  decline, memory medications: Call our office [phone]   For psychiatric meds, mood meds: Please have your primary care physician manage these medications.   Counseling regarding caregiver distress, including caregiver depression, anxiety and issues regarding community resources, adult day care programs, adult living facilities, or memory care questions:   Feel free to contact [place], Social Worker at [phone]   For assessment of decision of mental capacity and competency:  Call Dr. Anthoney Harada, geriatric psychiatrist at [phone]  For guidance in geriatric dementia issues please call [facility] [phone]  For guidance regarding [facility] and if placement were needed at the facility, contact Arnell Asal, Social Worker tel: [phone] [place] [phone]  If you have any severe symptoms of a stroke, or other severe issues such as confusion,severe chills or fever, etc call 911 or go to the ER as you may need to be evaluated further   Feel free to visit Facebook page " [employer]" for tips of how to care for people with memory problems.    Feel free to go to the following database for funded clinical studies conducted around the world: http://saunders.com/   https://www.triadclinicaltrials.com/     RECOMMENDATIONS FOR ALL PATIENTS WITH MEMORY PROBLEMS: 1. Continue to exercise (Recommend 30 minutes of walking everyday, or 3 hours every week) 2. Increase social interactions - continue going to [place] and enjoy social gatherings with friends and family 3. Eat healthy, avoid fried foods and eat more fruits and vegetables 4. Maintain adequate blood pressure, blood sugar, and blood cholesterol level. Reducing the risk of stroke and  cardiovascular disease also helps promoting better memory. 5. Avoid stressful situations. Live a simple life and avoid aggravations. Organize your time and prepare for the next day in anticipation. 6. Sleep well, avoid any interruptions of sleep and avoid any distractions in the bedroom that may interfere with adequate sleep quality 7. Avoid sugar, avoid sweets as there is a strong link between excessive sugar intake, diabetes, and cognitive impairment We discussed the Mediterranean diet, which has been shown to help patients reduce the risk of progressive memory disorders and reduces cardiovascular risk. This includes eating fish, eat fruits and green leafy vegetables, nuts like almonds and hazelnuts, walnuts, and also use olive oil. Avoid fast foods and fried foods as much as possible. Avoid sweets and sugar as sugar use has been linked to worsening of memory function.  There is always a concern of gradual progression of memory problems. If this is the case, then we may need to adjust level of care according to patient needs. Support, both to the patient and caregiver, should then be put into place.    FALL PRECAUTIONS: Be cautious when walking. Scan the area for obstacles that may increase the risk of trips and falls. When getting up in the mornings, sit up at the edge of the bed for a few minutes before getting out of bed. Consider elevating the bed at the head end to avoid drop of blood pressure when getting up. Walk always in a well-lit room (use night lights in the walls). Avoid area rugs or power cords from appliances in the middle of the walkways. Use a walker or a cane if necessary and consider physical therapy for  balance exercise. Get your eyesight checked regularly.  FINANCIAL OVERSIGHT: Supervision, especially oversight when making financial decisions or transactions is also recommended.  HOME SAFETY: Consider the safety of the kitchen when operating appliances like stoves, microwave oven,  and blender. Consider having supervision and share cooking responsibilities until no longer able to participate in those. Accidents with firearms and other hazards in the house should be identified and addressed as well.   ABILITY TO BE LEFT ALONE: If patient is unable to contact 911 operator, consider using LifeLine, or when the need is there, arrange for someone to stay with patients. Smoking is a fire hazard, consider supervision or cessation. Risk of wandering should be assessed by caregiver and if detected at any point, supervision and safe proof recommendations should be instituted.  MEDICATION SUPERVISION: Inability to self-administer medication needs to be constantly addressed. Implement a mechanism to ensure safe administration of the medications.   DRIVING: Regarding driving, in patients with progressive memory problems, driving will be impaired. We advise to have someone else do the driving if trouble finding directions or if minor accidents are reported. Independent driving assessment is available to determine safety of driving.   If you are interested in the driving assessment, you can contact the following:  [facility] [employer] in [place]  [place] [phone]  [place]  [facility] [phone] or [phone]

## 2022-07-19 NOTE — Progress Notes (Signed)
Assessment/Plan:   Amnestic MCI, etiology unclear  Zoe Kim is a very pleasant 77 y.o. RH female presenting today in follow-up for evaluation of memory loss. Patient is on donepezil 5 mg daily. Recent neuropsychological evaluation 07/02/2022 yielded a diagnosis of mild cognitive impairment, etiology unclear although there is concern for Alzheimer's disease. ,To date she has not had an MRI of the brain, she has not scheduled this although it could help to observe other potential contributions for ongoing cognitive decline.     Recommendations:   Follow up in 6 months. Continue donepezil 5 mg daily. Side effects were discussed  Repeat Neuropsych evaluation in 12 to 18 months for clarity of the diagnosis and disease trajectory Would recommend to proceed with MRI of the brain to observe other potential, structural contributions of memory loss and to evaluate vascular load. Continue B12 supplements Continue to control mood as per PCP.  Patient declines behavioral health for depression. Recommend good control of cardiovascular risk factors.       Subjective:   This patient is accompanied in the office by her husband  who supplements the history. Previous records as well as any outside records available were reviewed prior to todays visit.   Patient was last seen on 11/30/2021.  Last MoCA on 08/30/2021 was 21//30    Any changes in memory since last visit? "About the same".  Her short-term memory is worse than her long-term memory.  She enjoys crossword puzzles in the computer, card games.  She does not socialize outside of her environment.  She is also very active with her horses "I need to put one of my horses down because she is sick ". repeats oneself?  Endorsed, from time to time Disoriented when walking into a room?  Patient denies Leaving objects in unusual places?  Patient denies   Wandering behavior?   denies   Any personality changes since last visit?  Occasionally she  may become irritable as before. Any depression?:  Endorsed but not worse.  Hallucinations or paranoia?  denies   Seizures?   denies    Any sleep changes?  Sleeps well. She has lately "weird dreams, usually about Ballet ", REM behavior or sleepwalking   Sleep apnea?   denies   Any hygiene concerns?   denies   Independent of bathing and dressing?  Endorsed  Does the patient needs help with medications? Patient is in charge   Who is in charge of the finances?   Both are  in charge    Any changes in appetite?  Denies. "I eat out of boredom".     Patient have trouble swallowing?  denies   Does the patient cook?  I don't cook  Any headaches?    denies   Vision changes? Need to check my eyes, need an appointment  Chronic back pain  denies   Ambulates with difficulty?   She has a history of neuropathy, postsurgical.  Recent falls or head injuries?   denies     Unilateral weakness, numbness or tingling?   denies   Any tremors?  denies   Any anosmia?    denies   Any incontinence of urine?  She has a history of urge incontinence, but she does Kegel exercises and follows with Urology . Uses Depends Any bowel dysfunction?  Occasional constipation.     Patient lives  with her husband Does the patient drive?No issues driving     Initial visit 08/30/21 The patient is seen in neurologic  consultation at the request of [name], * for the evaluation of memory.  The patient is accompanied by her husband who supplements the history.This is a 77 y.o. year old RH  female who has had memory issues for about 1 year, worse over the last 6 months, when she began to notice that she was forgetting day-to-day chores, for example when to feed her horses, or keep appointments.  She describes it as a "vague feeling, I do not know how to describe it ".  She does repeat herself and asked the same questions.  She denies being disoriented when walking into her room or leaving objects in unusual places.  She has a  history of chemo-induced neuropathy, which at times gives her difficulty to ambulate due to numbness in both feet, but denies any unilateral weakness, or tingling.  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  She denies any wandering behavior.  She continues to drive without getting lost.  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  She feels that she has no direction, and other than feeding the dogs and the horses, and attending exercise classes twice a week, and ballet bar 3 times a week, and riding her horses she does not participate in other activities to keep her busy.  She denies irritability.  She never sought counseling.  She enjoys doing crossword puzzles, word finding, board games, painting as well.  She used to sleep less hours, but she began taking melatonin, and since her husband snores, there are no longer sleeping in the same bedroom, which helps, and now the sleep longer amount of time.  Her sleeping patterns however are irregular.  She denies any vivid dreams or sleepwalking.  She may talk in her sleep at times according to her husband.  She denies any hallucinations or paranoia.  There are no hygiene concerns, although she is not interested in bathing since she has nowhere to go, she now takes a shower every 3 days.  She places the medications in a pillbox, she does not forget to take them.  Her husband and her are in charge of the finances.  Her appetite has been increased, denies trouble swallowing.  She does not cook.  She denies any headaches.  She has issues with her vision, stating that when turning her eyes to the left or to  the right, she may notice less acuity, or double vision at times.  She denies any dizziness.  She denies any tremors.  For the last 2 years, she has anosmia.  No history of seizures.  She has history of urge incontinence, denies constipation or diarrhea.  Denies alcohol or tobacco use.  Family history negative for Alzheimer's disease or any other type of dementia.  She is retired from 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     Neuropsychological evaluation 07/02/2022 briefly, results suggested a primary impairment surrounding essentially all aspects of learning and memory. An additional weakness was exhibited across semantic fluency. Performances were otherwise appropriate relative to age-matched peers. The etiology for ongoing memory impairment is unclear.  With that being said, concerns for an underlying neurodegenerative illness, namely Alzheimer's disease, remain reasonable. Across memory testing, Ms. Lao did not show notable benefit from the repetition of information across learning trials, was fully amnestic (i.e, 0% retention) across 2/3 memory tasks, and generally performed poorly across recognition trials. Taken together, this suggests concern for rapid forgetting and an evolving storage impairment, both of which are the hallmark characteristics of this illness. Her additional weakness in semantic fluency would follow typical disease trajectory, adding to ongoing concern. Confrontation naming and visuospatial abilities were appropriate, which could suggest this illness being in early stages if truly present. Continued medical monitoring will be important moving forward.    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   Anemia due to antineoplastic chemotherapy 12/24/2018   Arthritis    Atherosclerosis of aorta 05/23/2021   Chronic fatigue syndrome 04/25/2021   Decreased  estrogen level 04/25/2021   Family history of breast cancer    Family history of lung cancer    GAD (generalized anxiety disorder) 04/25/2021   Heart murmur    "years ago"   History of adenomatous polyp of colon 04/25/2021   History of endometrial cancer    Insomnia    Major depressive disorder 04/25/2021   Neoplasm of uncertain behavior of skin    Obesity 10/14/2018   Peripheral neuropathy due to chemotherapy 11/20/2018   Personal history of malignant neoplasm of other parts of uterus 04/25/2021   Pneumonia    "long time ago"   Primary localized osteoarthritis of right knee 07/30/2016   Urge incontinence of urine 04/25/2021   Vitamin D deficiency 04/25/2021     Past Surgical History:  Procedure Laterality Date   COLONOSCOPY     EYE SURGERY Bilateral    cataract with lens   IR IMAGING GUIDED PORT INSERTION  11/06/2018   IR REMOVAL TUN ACCESS W/ PORT W/O FL MOD SED  02/13/2019   ROBOTIC ASSISTED TOTAL HYSTERECTOMY WITH BILATERAL SALPINGO OOPHERECTOMY N/A 10/14/2018   Procedure: ROBOTIC ASSISTED TOTAL HYSTERECTOMY WITH BILATERAL SALPINGO OOPHORECTOMY;  Surgeon: Everitt Amber, MD;  Location: [facility];  Service: Gynecology;  Laterality: N/A;   SENTINEL NODE BIOPSY N/A 10/14/2018   Procedure: SENTINEL LYMPH NODE BIOPSY;  Surgeon: Everitt Amber, MD;  Location: [facility];  Service: Gynecology;  Laterality: N/A;   TONSILLECTOMY     TOOTH EXTRACTION Right 08/27/2017   TOTAL KNEE ARTHROPLASTY Right 07/30/2016   Procedure: TOTAL KNEE ARTHROPLASTY;  Surgeon: Elsie Saas, MD;  Location: [facility];  Service: Orthopedics;  Laterality: Right;   VEIN LIGATION AND STRIPPING       PREVIOUS MEDICATIONS:   CURRENT MEDICATIONS:  Outpatient Encounter Medications as of 07/19/2022  Medication Sig   desvenlafaxine (PRISTIQ) 50 MG 24 hr tablet Take 1 tablet (50 mg total) by mouth daily.   donepezil (ARICEPT) 5 MG tablet Take half tablet 2.5 mg daily for 2 weeks, then increase to 1 tab 5 mg  daily.   gabapentin  (NEURONTIN) 400 MG capsule Take by mouth.   oxybutynin (DITROPAN XL) 15 MG 24 hr tablet Take 15 mg by mouth at bedtime.   [DISCONTINUED] gabapentin (NEURONTIN) 300 MG capsule Take 400 mg by mouth at bedtime. 2 tabs po bid   No facility-administered encounter medications on file as of 07/19/2022.     Objective:     PHYSICAL EXAMINATION:    VITALS:   Vitals:   07/19/22 0900  BP: 108/76  Pulse: 100  Resp: 20  SpO2: 96%  Weight: 184 lb (83.5 kg)    GEN:  The patient appears stated age and is in NAD. HEENT:  Normocephalic, atraumatic.   Neurological examination:  General: NAD, well-groomed, appears stated age. Orientation: The patient is alert. Oriented to person, place and date Cranial nerves: There is good facial symmetry.The speech is fluent and clear. No aphasia or dysarthria. Fund of knowledge is appropriate. Recent memory impaired and remote memory is normal.  Attention and concentration are normal.  Able to name objects and repeat phrases.  Hearing is intact to conversational tone.   Sensation: Sensation is intact to light touch throughout Motor: Strength is at least antigravity x4. Tremors: none  DTR's 2/4 in UE/LE      08/30/2021    8:00 AM  Montreal Cognitive Assessment   Visuospatial/ Executive (0/5) 3  Naming (0/3) 3  Attention: Read list of digits (0/2) 2  Attention: Read list of letters (0/1) 1  Attention: Serial 7 subtraction starting at 100 (0/3) 3  Language: Repeat phrase (0/2) 1  Language : Fluency (0/1) 0  Abstraction (0/2) 2  Delayed Recall (0/5) 0  Orientation (0/6) 6  Total 21  Adjusted Score (based on education) 21        No data to display             Movement examination: Tone: There is normal tone in the UE/LE Abnormal movements:  no tremor.  No myoclonus.  No asterixis.   Coordination:  There is no decremation with RAM's. Normal finger to nose  Gait and Station: The patient has no difficulty arising out of a deep-seated chair  without the use of the hands. The patient's stride length is good.  Gait is cautious and narrow.   Thank you for allowing [place] the opportunity to participate in the care of this nice patient. Please do not hesitate to contact us for any questions or concerns.   Total time spent on today's visit was 30 minutes dedicated to this patient today, preparing to see patient, examining the patient, ordering tests and/or medications and counseling the patient, documenting clinical information in the EHR or other health record, independently interpreting results and communicating results to the patient/family, discussing treatment and goals, answering patient's questions and coordinating care.  Cc:  [name], MD  Sharene Butters 07/19/2022 12:25 PM

## 2022-07-24 DIAGNOSIS — F321 Major depressive disorder, single episode, moderate: Secondary | ICD-10-CM | POA: Diagnosis not present

## 2022-07-24 DIAGNOSIS — H539 Unspecified visual disturbance: Secondary | ICD-10-CM | POA: Diagnosis not present

## 2022-07-24 DIAGNOSIS — R399 Unspecified symptoms and signs involving the genitourinary system: Secondary | ICD-10-CM | POA: Diagnosis not present

## 2022-07-24 DIAGNOSIS — R000 Tachycardia, unspecified: Secondary | ICD-10-CM | POA: Diagnosis not present

## 2022-07-24 DIAGNOSIS — L814 Other melanin hyperpigmentation: Secondary | ICD-10-CM | POA: Diagnosis not present

## 2022-07-24 DIAGNOSIS — G629 Polyneuropathy, unspecified: Secondary | ICD-10-CM | POA: Diagnosis not present

## 2022-07-24 DIAGNOSIS — H43393 Other vitreous opacities, bilateral: Secondary | ICD-10-CM | POA: Diagnosis not present

## 2022-07-24 DIAGNOSIS — R32 Unspecified urinary incontinence: Secondary | ICD-10-CM | POA: Diagnosis not present

## 2022-09-03 DIAGNOSIS — N3941 Urge incontinence: Secondary | ICD-10-CM | POA: Diagnosis not present

## 2022-09-11 ENCOUNTER — Other Ambulatory Visit: Payer: Self-pay

## 2022-09-11 ENCOUNTER — Ambulatory Visit: Payer: PPO | Attending: Family Medicine | Admitting: Physical Therapy

## 2022-09-11 ENCOUNTER — Encounter: Payer: Self-pay | Admitting: Physical Therapy

## 2022-09-11 DIAGNOSIS — M6281 Muscle weakness (generalized): Secondary | ICD-10-CM

## 2022-09-11 DIAGNOSIS — R293 Abnormal posture: Secondary | ICD-10-CM | POA: Diagnosis not present

## 2022-09-11 DIAGNOSIS — R279 Unspecified lack of coordination: Secondary | ICD-10-CM

## 2022-09-11 NOTE — Patient Instructions (Signed)

## 2022-09-11 NOTE — Therapy (Signed)
OUTPATIENT PHYSICAL THERAPY FEMALE PELVIC EVALUATION   Patient Name: Zoe Kim MRN: [id number] DOB:Mar 23, 1946, 77 y.o., female Today's Date: 09/11/2022  END OF SESSION:  PT End of Session - 09/11/22 1059     Visit Number 1    Date for PT Re-Evaluation 12/11/22    Authorization Type [employer]    PT Start Time 1100    PT Stop Time 1140    PT Time Calculation (min) 40 min    Activity Tolerance Patient tolerated treatment well    Behavior During Therapy WFL for tasks assessed/performed             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   Anemia due to antineoplastic chemotherapy 12/24/2018   Arthritis    Atherosclerosis of aorta 05/23/2021   Chronic fatigue syndrome 04/25/2021   Decreased estrogen level 04/25/2021   Family history of breast cancer    Family history of lung cancer    GAD (generalized anxiety disorder) 04/25/2021   Heart murmur    "years ago"   History of adenomatous polyp of colon 04/25/2021   History of endometrial cancer    Insomnia    Major depressive disorder 04/25/2021   Neoplasm of uncertain behavior of skin    Obesity 10/14/2018   Peripheral neuropathy due to chemotherapy 11/20/2018   Personal history of malignant neoplasm of other parts of uterus 04/25/2021   Pneumonia    "long time ago"   Primary localized osteoarthritis of right knee 07/30/2016   Urge incontinence of urine 04/25/2021   Vitamin D deficiency 04/25/2021   Past Surgical History:  Procedure Laterality Date   COLONOSCOPY     EYE SURGERY Bilateral    cataract with lens   IR IMAGING GUIDED PORT INSERTION  11/06/2018   IR REMOVAL TUN ACCESS W/ PORT W/O FL MOD SED  02/13/2019   ROBOTIC ASSISTED TOTAL HYSTERECTOMY WITH BILATERAL SALPINGO OOPHERECTOMY N/A 10/14/2018   Procedure: ROBOTIC ASSISTED TOTAL HYSTERECTOMY WITH BILATERAL SALPINGO OOPHORECTOMY;  Surgeon: Adolphus Birchwoodossi, Emma, MD;  Location: [facility]  [facility];  Service: Gynecology;  Laterality: N/A;   SENTINEL NODE BIOPSY N/A 10/14/2018   Procedure: SENTINEL LYMPH NODE BIOPSY;  Surgeon: Adolphus Birchwoodossi, Emma, MD;  Location: [facility];  Service: Gynecology;  Laterality: N/A;   TONSILLECTOMY     TOOTH EXTRACTION Right 08/27/2017   TOTAL KNEE ARTHROPLASTY Right 07/30/2016   Procedure: TOTAL KNEE ARTHROPLASTY;  Surgeon: Salvatore Marvelobert Wainer, MD;  [facility];  Service: Orthopedics;  Laterality: Right;   VEIN LIGATION AND STRIPPING     Patient Active Problem List   Diagnosis Date Noted   Neoplasm of uncertain behavior of skin    Amnestic MCI (mild cognitive impairment with memory loss) 08/30/2021   Atherosclerosis of aorta 05/23/2021   Major depressive disorder 04/25/2021   Personal history of malignant neoplasm of other parts of uterus 04/25/2021   Urge incontinence of urine 04/25/2021   Vitamin D deficiency 04/25/2021   History of adenomatous polyp of colon 04/25/2021   Decreased estrogen level 04/25/2021   Chronic fatigue syndrome 04/25/2021   GAD (generalized anxiety disorder) 04/25/2021   Anemia due to antineoplastic chemotherapy 12/24/2018   Peripheral neuropathy due to chemotherapy 11/20/2018   Family history of breast cancer    Family history of lung cancer    Obesity 10/14/2018   Primary localized osteoarthritis of right knee 07/30/2016    PCP: Shon Haleimberlake, Kathryn S, MD  REFERRING PROVIDER: Camie PatienceHayes, Romana S, FNP  REFERRING DIAG: R32 (ICD-10-CM) - Unspecified urinary incontinence  THERAPY DIAG:  Muscle weakness (generalized)  Unspecified lack of coordination  Abnormal posture  Rationale for Evaluation and Treatment: Rehabilitation  ONSET DATE: few years ago   SUBJECTIVE:                                                                                                                                                                                           SUBJECTIVE STATEMENT: Pt reports she got neuropathy after CA treatments and with  treatments feels like she has been depressed and wants to be more active and mobile, "Like I used to". Cancer treatments finished a few years ago. Did have hysterectomy with this as well and reports with this and treatments she developed urinary incontinence. Has leakage and doesn't always know when/how. Wears a large overnight diaper and needs to change every few hours. States she has a bowel movement every 5 days.   Fluid intake: Yes: ~2-3 16oz bottles per day    PAIN:  Are you having pain? No  PRECAUTIONS: None  WEIGHT BEARING RESTRICTIONS: No  FALLS:  Has patient fallen in last 6 months? No  LIVING ENVIRONMENT: Lives with: lives with their family Lives in: House/apartment    OCCUPATION: retired  PLOF: Independent  PATIENT GOALS: to be more active with less leakage  PERTINENT HISTORY:  mild cognitive impairment, endometrial cancer, Urge incontinence of urine,  Sexual abuse: No  BOWEL MOVEMENT: Pain with bowel movement: No Type of bowel movement:Type (Bristol Stool Scale) 1-2, Frequency 1x/5days, and Strain Yes Fully empty rectum: Yes:   Leakage: No Pads: No Fiber supplement: No  URINATION: Pain with urination: No Fully empty bladder: Yes:   Stream: Strong and Weak Urgency:no Frequency: gets an urge 1-2x per day, goes to the bathroom about every 3 hours due to feeling wet diaper Leakage:  doesn't notice leakage with tasks and unknown when she does leak Pads: Yes: several diapers during the day   INTERCOURSE: Pain with intercourse:  not active Ability to have vaginal penetration:  Yes:   Climax: doesn't try Marinoff Scale: 0/3  PREGNANCY: Vaginal deliveries 0  C-section deliveries 0 Currently pregnant No  PROLAPSE: No    OBJECTIVE:   DIAGNOSTIC FINDINGS:    COGNITION: Overall cognitive status:  per chart review and pt endorses with memory problems, pt states she has "first stage dementia"      SENSATION: Light touch: Appears  intact Proprioception: Appears intact  MUSCLE LENGTH: Bil hamstrings and adductors limited by 50%   FUNCTIONAL TESTS:  5 times sit to stand: 10.54s Single leg stance - 2s Rt 4s Lt -  heavy sway and multiple attempts    POSTURE: rounded shoulders, forward head, and posterior pelvic tilt   LUMBARAROM/PROM:  A/PROM A/PROM  eval  Flexion WFL  Extension WFL  Right lateral flexion Limited by 50%  Left lateral flexion Limited by 50%  Right rotation Limited by 25%  Left rotation Limited by 25%   (Blank rows = not tested)  LOWER EXTREMITY ROM:  WFL  LOWER EXTREMITY MMT:   Rt hip grossly 4/5, Lt hip 3+/5, knees 4/5.   PALPATION:   General  no TTP but did have tightness noted in upper quadrants of abdomen                External Perineal Exam mild dryness and redness noted. Pt reports she feels like she has urine present "all the time".                              Internal Pelvic Floor reported slight tightness at superficial right and left layers. Pt did have scant bleeding noted on glove post internal assessment. Pt denied any pain, informed of blood noted and reports she hasn't had spotting and educated to inform her doctor if this continues. Pt agreed.   Patient confirms identification and approves PT to assess internal pelvic floor and treatment Yes  PELVIC MMT:   MMT eval  Vaginal 3/5, 5reps, 7s  Internal Anal Sphincter   External Anal Sphincter   Puborectalis   Diastasis Recti   (Blank rows = not tested)        TONE: Mild decreased   PROLAPSE: Not noted in hooklying   TODAY'S TREATMENT:                                                                                                                              DATE:   09/11/2022 EVAL Examination completed, findings reviewed, pt educated on POC, HEP, and bladder irritants. Pt motivated to participate in PT and agreeable to attempt recommendations.     PATIENT EDUCATION:  Education details: [id number] Person  educated: Patient Education method: Explanation, Demonstration, Tactile cues, Verbal cues, and Handouts Education comprehension: verbalized understanding and returned demonstration  HOME EXERCISE PROGRAM: [id number]  ASSESSMENT:  CLINICAL IMPRESSION: Patient is a 77 y.o. female  who was seen today for physical therapy evaluation and treatment for urinary incontinence and decreased mobility. Pt reports she is very interested in participating in PT as she feels she has been less mobile since cancer treatments and has had increased urinary leakage since hysterectomy and cancer treatments as well. Pt found to have decreased flexibility in spine and hips, decreased strength in hips and core. Pt also had decreased static an dynamic standing balance, noted with mild LOB with turning in hallway and dressing. SLS testing also demonstrated fall risk as she is unable to stand more than 5s on either leg. Patient consented to internal pelvic floor assessment vaginally  this date and found to have decreased strength, endurance, and coordination. Pt also tightness noted and reported at bil superficial layers. PT noted scant bleeding on glove post internal assessment. Pt denied any pain during assessment and denied previous spotting. PT instructed pt to inform her doctor if this continued. Pt agreed. Patient benefited from verbal cues for improved technique with pelvic floor contractions and relaxation for      OBJECTIVE IMPAIRMENTS: decreased activity tolerance, decreased balance, decreased coordination, decreased endurance, decreased mobility, decreased strength, increased fascial restrictions, impaired flexibility, improper body mechanics, and postural dysfunction.   ACTIVITY LIMITATIONS: lifting, standing, squatting, stairs, continence, and locomotion level  PARTICIPATION LIMITATIONS: cleaning, interpersonal relationship, shopping, community activity, and yard work  PERSONAL FACTORS: Fitness, Time since onset of  injury/illness/exacerbation, and 1 comorbidity: medical history  are also affecting patient's functional outcome.   REHAB POTENTIAL: Good  CLINICAL DECISION MAKING: Stable/uncomplicated  EVALUATION COMPLEXITY: Low   GOALS: Goals reviewed with patient? Yes  SHORT TERM GOALS: Target date: 10/09/22  Pt to be I with HEP.  Baseline: Goal status: INITIAL  2.  Pt to demonstrate at least 4/5 pelvic floor strength for improved pelvic stability and decreased strain at pelvic floor/ decrease leakage.  Baseline:  Goal status: INITIAL  3.  Pt to demonstrate improved coordination of pelvic floor and breathing mechanics with body weight squat with appropriate synergistic patterns to decrease pain and leakage at least 25% of the time.    Baseline:  Goal status: INITIAL  4.  Pt will have to use 3 diapers per day for improved skin integrity.  Baseline:  Goal status: INITIAL  5.  Pt to demonstrate improved standing balance with improved single leg stance time to at least 5s on either leg to decreased fall risk while ambulating to/from restroom.  Baseline:  Goal status: INITIAL   LONG TERM GOALS: Target date: 12/11/22  Pt to be I with advanced HEP.  Baseline:  Goal status: INITIAL  2.  Pt to demonstrate at least 4/5 pelvic floor strength and ability to hold isometric for 8s for improved pelvic stability and decreased strain at pelvic floor/ decrease leakage.  Baseline:  Goal status: INITIAL  3.   Pt to demonstrate improved coordination of pelvic floor and breathing mechanics with body weight squat with appropriate synergistic patterns to decrease pain and leakage at least 50% of the time.   Baseline:  Goal status: INITIAL  4.  Pt will have to use 2 diapers per day for improved skin integrity.  Baseline:  Goal status: INITIAL  5.  Pt to demonstrate improved standing balance with improved single leg stance time to at least 8s on either leg to decreased fall risk while ambulating to/from  restroom.  Baseline:  Goal status: INITIAL  6.  Pt to demonstrate at least 5/5 bil hip strength for improved pelvic stability and functional squats without leakage.  Baseline:  Goal status: INITIAL  PLAN:  PT FREQUENCY: 1x/week  PT DURATION: 12 weeks  PLANNED INTERVENTIONS: Therapeutic exercises, Therapeutic activity, Neuromuscular re-education, Balance training, Gait training, Patient/Family education, Self Care, Aquatic Therapy, Spinal mobilization, Cryotherapy, Moist heat, scar mobilization, Taping, Biofeedback, and Manual therapy  PLAN FOR NEXT SESSION: core and hip strengthening, pelvic floor mobility and strengthening, bladder retraining, balance training with coordination of pelvic floor activity  Otelia Sergeant, PT, DPT 09/10/2409:52 AM

## 2022-09-16 ENCOUNTER — Other Ambulatory Visit: Payer: Self-pay | Admitting: Physician Assistant

## 2022-09-18 ENCOUNTER — Ambulatory Visit: Payer: PPO | Admitting: Physical Therapy

## 2022-09-18 DIAGNOSIS — M6281 Muscle weakness (generalized): Secondary | ICD-10-CM

## 2022-09-18 DIAGNOSIS — R279 Unspecified lack of coordination: Secondary | ICD-10-CM

## 2022-09-18 DIAGNOSIS — R293 Abnormal posture: Secondary | ICD-10-CM

## 2022-09-18 NOTE — Therapy (Signed)
OUTPATIENT PHYSICAL THERAPY FEMALE PELVIC TREATMENT   Patient Name: Zoe Kim MRN: [id number] DOB:Jun 27, 1945, 77 y.o., female Today's Date: 09/18/2022  END OF SESSION:  PT End of Session - 09/18/22 1114     Visit Number 2    Date for PT Re-Evaluation 12/11/22    Authorization Type [employer]    PT Start Time 1112   arrival time   PT Stop Time 1145    PT Time Calculation (min) 33 min    Activity Tolerance Patient tolerated treatment well    Behavior During Therapy [facility] for tasks assessed/performed              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   Anemia due to antineoplastic chemotherapy 12/24/2018   Arthritis    Atherosclerosis of aorta 05/23/2021   Chronic fatigue syndrome 04/25/2021   Decreased estrogen level 04/25/2021   Family history of breast cancer    Family history of lung cancer    GAD (generalized anxiety disorder) 04/25/2021   Heart murmur    "years ago"   History of adenomatous polyp of colon 04/25/2021   History of endometrial cancer    Insomnia    Major depressive disorder 04/25/2021   Neoplasm of uncertain behavior of skin    Obesity 10/14/2018   Peripheral neuropathy due to chemotherapy 11/20/2018   Personal history of malignant neoplasm of other parts of uterus 04/25/2021   Pneumonia    "long time ago"   Primary localized osteoarthritis of right knee 07/30/2016   Urge incontinence of urine 04/25/2021   Vitamin D deficiency 04/25/2021   Past Surgical History:  Procedure Laterality Date   COLONOSCOPY     EYE SURGERY Bilateral    cataract with lens   IR IMAGING GUIDED PORT INSERTION  11/06/2018   IR REMOVAL TUN ACCESS W/ PORT W/O FL MOD SED  02/13/2019   ROBOTIC ASSISTED TOTAL HYSTERECTOMY WITH BILATERAL SALPINGO OOPHERECTOMY N/A 10/14/2018   Procedure: ROBOTIC ASSISTED TOTAL HYSTERECTOMY WITH BILATERAL SALPINGO OOPHORECTOMY;  Surgeon: Adolphus Birchwood, MD;   Location: [facility];  Service: Gynecology;  Laterality: N/A;   SENTINEL NODE BIOPSY N/A 10/14/2018   Procedure: SENTINEL LYMPH NODE BIOPSY;  Surgeon: Adolphus Birchwood, MD;  Location: [facility];  Service: Gynecology;  Laterality: N/A;   TONSILLECTOMY     TOOTH EXTRACTION Right 08/27/2017   TOTAL KNEE ARTHROPLASTY Right 07/30/2016   Procedure: TOTAL KNEE ARTHROPLASTY;  Surgeon: Salvatore Marvel, MD;  Location: [facility];  Service: Orthopedics;  Laterality: Right;   VEIN LIGATION AND STRIPPING     Patient Active Problem List   Diagnosis Date Noted   Neoplasm of uncertain behavior of skin    Amnestic MCI (mild cognitive impairment with memory loss) 08/30/2021   Atherosclerosis of aorta 05/23/2021   Major depressive disorder 04/25/2021   Personal history of malignant neoplasm of other parts of uterus 04/25/2021   Urge incontinence of urine 04/25/2021   Vitamin D deficiency 04/25/2021   History of adenomatous polyp of colon 04/25/2021   Decreased estrogen level 04/25/2021   Chronic fatigue syndrome 04/25/2021   GAD (generalized anxiety disorder) 04/25/2021   Anemia due to antineoplastic chemotherapy 12/24/2018   Peripheral neuropathy due to chemotherapy 11/20/2018   Family history of breast cancer    Family history of lung cancer    Obesity 10/14/2018   Primary localized osteoarthritis of right knee 07/30/2016    PCP: Shon Hale, MD  REFERRING PROVIDER:  Camie Patience, FNP  REFERRING DIAG: R32 (ICD-10-CM) - Unspecified urinary incontinence  THERAPY DIAG:  Abnormal posture  Unspecified lack of coordination  Muscle weakness (generalized)  Rationale for Evaluation and Treatment: Rehabilitation  ONSET DATE: few years ago   SUBJECTIVE:                                                                                                                                                                                           SUBJECTIVE STATEMENT: Pt reports she has a few good days after eval  with no leakage at all and then just had a small amount, yesterday "felt more normal" with leakage again. Pt apologetic she is a little late for appt because she got lost. Pt does report she forgot about the isometrics HEP but would like to do HEP here today to help remember.   Fluid intake: Yes: ~2-3 16oz bottles per day    PAIN:  Are you having pain? No  PRECAUTIONS: None  WEIGHT BEARING RESTRICTIONS: No  FALLS:  Has patient fallen in last 6 months? No  LIVING ENVIRONMENT: Lives with: lives with their family Lives in: House/apartment    OCCUPATION: retired  PLOF: Independent  PATIENT GOALS: to be more active with less leakage  PERTINENT HISTORY:  mild cognitive impairment, endometrial cancer, Urge incontinence of urine,  Sexual abuse: No  BOWEL MOVEMENT: Pain with bowel movement: No Type of bowel movement:Type (Bristol Stool Scale) 1-2, Frequency 1x/5days, and Strain Yes Fully empty rectum: Yes:   Leakage: No Pads: No Fiber supplement: No  URINATION: Pain with urination: No Fully empty bladder: Yes:   Stream: Strong and Weak Urgency:no Frequency: gets an urge 1-2x per day, goes to the bathroom about every 3 hours due to feeling wet diaper Leakage:  doesn't notice leakage with tasks and unknown when she does leak Pads: Yes: several diapers during the day   INTERCOURSE: Pain with intercourse:  not active Ability to have vaginal penetration:  Yes:   Climax: doesn't try Marinoff Scale: 0/3  PREGNANCY: Vaginal deliveries 0  C-section deliveries 0 Currently pregnant No  PROLAPSE: No    OBJECTIVE:   DIAGNOSTIC FINDINGS:    COGNITION: Overall cognitive status:  per chart review and pt endorses with memory problems, pt states she has "first stage dementia"      SENSATION: Light touch: Appears intact Proprioception: Appears intact  MUSCLE LENGTH: Bil hamstrings and adductors limited by 50%   FUNCTIONAL TESTS:  5 times sit to stand:  10.54s Single leg stance - 2s Rt 4s Lt - heavy sway and multiple attempts    POSTURE: rounded shoulders, forward head,  and posterior pelvic tilt   LUMBARAROM/PROM:  A/PROM A/PROM  eval  Flexion WFL  Extension WFL  Right lateral flexion Limited by 50%  Left lateral flexion Limited by 50%  Right rotation Limited by 25%  Left rotation Limited by 25%   (Blank rows = not tested)  LOWER EXTREMITY ROM:  WFL  LOWER EXTREMITY MMT:   Rt hip grossly 4/5, Lt hip 3+/5, knees 4/5.   PALPATION:   General  no TTP but did have tightness noted in upper quadrants of abdomen                External Perineal Exam mild dryness and redness noted. Pt reports she feels like she has urine present "all the time".                              Internal Pelvic Floor reported slight tightness at superficial right and left layers. Pt did have scant bleeding noted on glove post internal assessment. Pt denied any pain, informed of blood noted and reports she hasn't had spotting and educated to inform her doctor if this continues. Pt agreed.   Patient confirms identification and approves PT to assess internal pelvic floor and treatment Yes  PELVIC MMT:   MMT eval  Vaginal 3/5, 5reps, 7s  Internal Anal Sphincter   External Anal Sphincter   Puborectalis   Diastasis Recti   (Blank rows = not tested)        TONE: Mild decreased   PROLAPSE: Not noted in hooklying   TODAY'S TREATMENT:                                                                                                                              DATE:   09/11/2022 EVAL Examination completed, findings reviewed, pt educated on POC, HEP, and bladder irritants. Pt motivated to participate in PT and agreeable to attempt recommendations.    09/18/2022: NMRE: all exercises cued for coordination of breathing mechanics, core activation, and pelvic floor mobility for improved strength and coordination of pelvic floor 3x10 seated pelvic floor  contractions with exhale, last 10 reps with towel roll for improved awareness of pelvic floor 2x10 seated pelvic floor contractions with ball press between hands 2x10 red loop hip abduction seated 2x10 5-8s isometrics in sitting X10 coughing with pelvic floor contraction X5 Sit to stands from standard chair     PATIENT EDUCATION:  Education details: [id number] Person educated: Patient Education method: Explanation, Demonstration, Tactile cues, Verbal cues, and Handouts Education comprehension: verbalized understanding and returned demonstration  HOME EXERCISE PROGRAM: [id number]  ASSESSMENT:  CLINICAL IMPRESSION: Patient  presented to clinic for treatment session. Session focused on coordination of pelvic floor with breathing and with activity to decreased leakage with stressors and urge. Pt tolerated well but did require max verbal cues consistently for coordination and proper technique. Pt very pleasant and motivated and able to complete  all exercises with reported good feeling of pelvic floor contraction but needs extra time and cues. Pt would benefit from additional PT to further address deficits.      OBJECTIVE IMPAIRMENTS: decreased activity tolerance, decreased balance, decreased coordination, decreased endurance, decreased mobility, decreased strength, increased fascial restrictions, impaired flexibility, improper body mechanics, and postural dysfunction.   ACTIVITY LIMITATIONS: lifting, standing, squatting, stairs, continence, and locomotion level  PARTICIPATION LIMITATIONS: cleaning, interpersonal relationship, shopping, community activity, and yard work  PERSONAL FACTORS: Fitness, Time since onset of injury/illness/exacerbation, and 1 comorbidity: medical history  are also affecting patient's functional outcome.   REHAB POTENTIAL: Good  CLINICAL DECISION MAKING: Stable/uncomplicated  EVALUATION COMPLEXITY: Low   GOALS: Goals reviewed with patient? Yes  SHORT TERM  GOALS: Target date: 10/09/22  Pt to be I with HEP.  Baseline: Goal status: INITIAL  2.  Pt to demonstrate at least 4/5 pelvic floor strength for improved pelvic stability and decreased strain at pelvic floor/ decrease leakage.  Baseline:  Goal status: INITIAL  3.  Pt to demonstrate improved coordination of pelvic floor and breathing mechanics with body weight squat with appropriate synergistic patterns to decrease pain and leakage at least 25% of the time.    Baseline:  Goal status: INITIAL  4.  Pt will have to use 3 diapers per day for improved skin integrity.  Baseline:  Goal status: INITIAL  5.  Pt to demonstrate improved standing balance with improved single leg stance time to at least 5s on either leg to decreased fall risk while ambulating to/from restroom.  Baseline:  Goal status: INITIAL   LONG TERM GOALS: Target date: 12/11/22  Pt to be I with advanced HEP.  Baseline:  Goal status: INITIAL  2.  Pt to demonstrate at least 4/5 pelvic floor strength and ability to hold isometric for 8s for improved pelvic stability and decreased strain at pelvic floor/ decrease leakage.  Baseline:  Goal status: INITIAL  3.   Pt to demonstrate improved coordination of pelvic floor and breathing mechanics with body weight squat with appropriate synergistic patterns to decrease pain and leakage at least 50% of the time.   Baseline:  Goal status: INITIAL  4.  Pt will have to use 2 diapers per day for improved skin integrity.  Baseline:  Goal status: INITIAL  5.  Pt to demonstrate improved standing balance with improved single leg stance time to at least 8s on either leg to decreased fall risk while ambulating to/from restroom.  Baseline:  Goal status: INITIAL  6.  Pt to demonstrate at least 5/5 bil hip strength for improved pelvic stability and functional squats without leakage.  Baseline:  Goal status: INITIAL  PLAN:  PT FREQUENCY: 1x/week  PT DURATION: 12 weeks  PLANNED  INTERVENTIONS: Therapeutic exercises, Therapeutic activity, Neuromuscular re-education, Balance training, Gait training, Patient/Family education, Self Care, Aquatic Therapy, Spinal mobilization, Cryotherapy, Moist heat, scar mobilization, Taping, Biofeedback, and Manual therapy  PLAN FOR NEXT SESSION: core and hip strengthening, pelvic floor mobility and strengthening, bladder retraining, balance training with coordination of pelvic floor activity  Otelia Sergeant, PT, DPT 09/17/2409:46 AM

## 2022-09-25 ENCOUNTER — Ambulatory Visit: Payer: PPO | Admitting: Physical Therapy

## 2022-09-25 DIAGNOSIS — R293 Abnormal posture: Secondary | ICD-10-CM

## 2022-09-25 DIAGNOSIS — R279 Unspecified lack of coordination: Secondary | ICD-10-CM

## 2022-09-25 DIAGNOSIS — M6281 Muscle weakness (generalized): Secondary | ICD-10-CM

## 2022-09-25 NOTE — Therapy (Signed)
OUTPATIENT PHYSICAL THERAPY FEMALE PELVIC TREATMENT   Patient Name: Zoe Kim MRN: [id number] DOB:27-Feb-1946, 77 y.o., female Today's Date: 09/25/2022  END OF SESSION:  PT End of Session - 09/25/22 1106     Visit Number 3    Date for PT Re-Evaluation 12/11/22    Authorization Type [employer]    PT Start Time 1104    PT Stop Time 1145    PT Time Calculation (min) 41 min    Activity Tolerance Patient tolerated treatment well    Behavior During Therapy WFL for tasks assessed/performed               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   Anemia due to antineoplastic chemotherapy 12/24/2018   Arthritis    Atherosclerosis of aorta 05/23/2021   Chronic fatigue syndrome 04/25/2021   Decreased estrogen level 04/25/2021   Family history of breast cancer    Family history of lung cancer    GAD (generalized anxiety disorder) 04/25/2021   Heart murmur    "years ago"   History of adenomatous polyp of colon 04/25/2021   History of endometrial cancer    Insomnia    Major depressive disorder 04/25/2021   Neoplasm of uncertain behavior of skin    Obesity 10/14/2018   Peripheral neuropathy due to chemotherapy 11/20/2018   Personal history of malignant neoplasm of other parts of uterus 04/25/2021   Pneumonia    "long time ago"   Primary localized osteoarthritis of right knee 07/30/2016   Urge incontinence of urine 04/25/2021   Vitamin D deficiency 04/25/2021   Past Surgical History:  Procedure Laterality Date   COLONOSCOPY     EYE SURGERY Bilateral    cataract with lens   IR IMAGING GUIDED PORT INSERTION  11/06/2018   IR REMOVAL TUN ACCESS W/ PORT W/O FL MOD SED  02/13/2019   ROBOTIC ASSISTED TOTAL HYSTERECTOMY WITH BILATERAL SALPINGO OOPHERECTOMY N/A 10/14/2018   Procedure: ROBOTIC ASSISTED TOTAL HYSTERECTOMY WITH BILATERAL SALPINGO OOPHORECTOMY;  Surgeon: Adolphus Birchwood, MD;  Location: [facility]  ORS;  Service: Gynecology;  Laterality: N/A;   SENTINEL NODE BIOPSY N/A 10/14/2018   Procedure: SENTINEL LYMPH NODE BIOPSY;  Surgeon: Adolphus Birchwood, MD;  Location: [facility] ORS;  Service: Gynecology;  Laterality: N/A;   TONSILLECTOMY     TOOTH EXTRACTION Right 08/27/2017   TOTAL KNEE ARTHROPLASTY Right 07/30/2016   Procedure: TOTAL KNEE ARTHROPLASTY;  Surgeon: Salvatore Marvel, MD;  Location: [facility];  Service: Orthopedics;  Laterality: Right;   VEIN LIGATION AND STRIPPING     Patient Active Problem List   Diagnosis Date Noted   Neoplasm of uncertain behavior of skin    Amnestic MCI (mild cognitive impairment with memory loss) 08/30/2021   Atherosclerosis of aorta 05/23/2021   Major depressive disorder 04/25/2021   Personal history of malignant neoplasm of other parts of uterus 04/25/2021   Urge incontinence of urine 04/25/2021   Vitamin D deficiency 04/25/2021   History of adenomatous polyp of colon 04/25/2021   Decreased estrogen level 04/25/2021   Chronic fatigue syndrome 04/25/2021   GAD (generalized anxiety disorder) 04/25/2021   Anemia due to antineoplastic chemotherapy 12/24/2018   Peripheral neuropathy due to chemotherapy 11/20/2018   Family history of breast cancer    Family history of lung cancer    Obesity 10/14/2018   Primary localized osteoarthritis of right knee 07/30/2016    PCP: Shon Hale, MD  REFERRING PROVIDER: Jorge Ny  S, FNP  REFERRING DIAG: R32 (ICD-10-CM) - Unspecified urinary incontinence  THERAPY DIAG:  Abnormal posture  Unspecified lack of coordination  Muscle weakness (generalized)  Rationale for Evaluation and Treatment: Rehabilitation  ONSET DATE: few years ago   SUBJECTIVE:                                                                                                                                                                                           SUBJECTIVE STATEMENT: Pt reports she has had some return of more leakage and  needing to change diapers a little more often now, pt states she notices improvements with medication (started 4/10 and had a huge improvement and now returned again)  Pt reports she has been doing kegel exercises with driving   Fluid intake: Yes: ~2-3 16oz bottles per day    PAIN:  Are you having pain? No  PRECAUTIONS: None  WEIGHT BEARING RESTRICTIONS: No  FALLS:  Has patient fallen in last 6 months? No  LIVING ENVIRONMENT: Lives with: lives with their family Lives in: House/apartment    OCCUPATION: retired  PLOF: Independent  PATIENT GOALS: to be more active with less leakage  PERTINENT HISTORY:  mild cognitive impairment, endometrial cancer, Urge incontinence of urine,  Sexual abuse: No  BOWEL MOVEMENT: Pain with bowel movement: No Type of bowel movement:Type (Bristol Stool Scale) 1-2, Frequency 1x/5days, and Strain Yes Fully empty rectum: Yes:   Leakage: No Pads: No Fiber supplement: No  URINATION: Pain with urination: No Fully empty bladder: Yes:   Stream: Strong and Weak Urgency:no Frequency: gets an urge 1-2x per day, goes to the bathroom about every 3 hours due to feeling wet diaper Leakage:  doesn't notice leakage with tasks and unknown when she does leak Pads: Yes: several diapers during the day   INTERCOURSE: Pain with intercourse:  not active Ability to have vaginal penetration:  Yes:   Climax: doesn't try Marinoff Scale: 0/3  PREGNANCY: Vaginal deliveries 0  C-section deliveries 0 Currently pregnant No  PROLAPSE: No    OBJECTIVE:   DIAGNOSTIC FINDINGS:    COGNITION: Overall cognitive status:  per chart review and pt endorses with memory problems, pt states she has "first stage dementia"      SENSATION: Light touch: Appears intact Proprioception: Appears intact  MUSCLE LENGTH: Bil hamstrings and adductors limited by 50%   FUNCTIONAL TESTS:  5 times sit to stand: 10.54s Single leg stance - 2s Rt 4s Lt - heavy sway and  multiple attempts    POSTURE: rounded shoulders, forward head, and posterior pelvic tilt   LUMBARAROM/PROM:  A/PROM A/PROM  eval  Flexion [facility]  Extension WFL  Right lateral flexion Limited by 50%  Left lateral flexion Limited by 50%  Right rotation Limited by 25%  Left rotation Limited by 25%   (Blank rows = not tested)  LOWER EXTREMITY ROM:  WFL  LOWER EXTREMITY MMT:   Rt hip grossly 4/5, Lt hip 3+/5, knees 4/5.   PALPATION:   General  no TTP but did have tightness noted in upper quadrants of abdomen                External Perineal Exam mild dryness and redness noted. Pt reports she feels like she has urine present "all the time".                              Internal Pelvic Floor reported slight tightness at superficial right and left layers. Pt did have scant bleeding noted on glove post internal assessment. Pt denied any pain, informed of blood noted and reports she hasn't had spotting and educated to inform her doctor if this continues. Pt agreed.   Patient confirms identification and approves PT to assess internal pelvic floor and treatment Yes  PELVIC MMT:   MMT eval  Vaginal 3/5, 5reps, 7s  Internal Anal Sphincter   External Anal Sphincter   Puborectalis   Diastasis Recti   (Blank rows = not tested)        TONE: Mild decreased   PROLAPSE: Not noted in hooklying   TODAY'S TREATMENT:                                                                                                                              DATE:   09/11/2022 EVAL Examination completed, findings reviewed, pt educated on POC, HEP, and bladder irritants. Pt motivated to participate in PT and agreeable to attempt recommendations.    09/18/2022: NMRE: all exercises cued for coordination of breathing mechanics, core activation, and pelvic floor mobility for improved strength and coordination of pelvic floor 3x10 seated pelvic floor contractions with exhale, last 10 reps with towel roll for  improved awareness of pelvic floor 2x10 seated pelvic floor contractions with ball press between hands 2x10 red loop hip abduction seated 2x10 5-8s isometrics in sitting X10 coughing with pelvic floor contraction X5 Sit to stands from standard chair  09/25/2022  NMRE: all exercises cued for coordination of breathing mechanics, core activation, and pelvic floor mobility for improved strength and coordination of pelvic floor 3x10 seated pelvic floor contractions with exhale  2x10 Sit to stands from standard chair Walking with intermittent pelvic floor contractions with stops if urge to leak or leakage occurs to do x5 quick contractions. Pt had one leak initially with first few feet of walking but with quick contractions with urge no additional leakage instances.  X10 downward diagonal 8# rows each side to try to simulate shoveling which pt reports she has leakage with for horse care.  Extra  time throughout session needed for cues, education with pt's limited memory for new information. Pt reports reps and having her exercises printed really helps her remember and doing tasks that are functional for her helps.   PATIENT EDUCATION:  Education details: [id number] Person educated: Patient Education method: Explanation, Demonstration, Tactile cues, Verbal cues, and Handouts Education comprehension: verbalized understanding and returned demonstration  HOME EXERCISE PROGRAM: [id number]  ASSESSMENT:  CLINICAL IMPRESSION: Patient  presented to clinic for treatment session. Session focused on coordination of pelvic floor with breathing and with activity to decreased leakage with stressors and urge. Pt tolerated well but did require max verbal cues consistently for coordination and proper technique. Pt very pleasant and motivated and able to complete all exercises with reported good feeling of pelvic floor contraction but needs extra time and cues. Today, session progressed toward simulating activities that  causes pt's leakage specifically when working with her horse. Pt denied leakage throughout most of these did have one leakage instance initially but no more with education on technique and cues. Pt would benefit from additional PT to further address deficits.      OBJECTIVE IMPAIRMENTS: decreased activity tolerance, decreased balance, decreased coordination, decreased endurance, decreased mobility, decreased strength, increased fascial restrictions, impaired flexibility, improper body mechanics, and postural dysfunction.   ACTIVITY LIMITATIONS: lifting, standing, squatting, stairs, continence, and locomotion level  PARTICIPATION LIMITATIONS: cleaning, interpersonal relationship, shopping, community activity, and yard work  PERSONAL FACTORS: Fitness, Time since onset of injury/illness/exacerbation, and 1 comorbidity: medical history  are also affecting patient's functional outcome.   REHAB POTENTIAL: Good  CLINICAL DECISION MAKING: Stable/uncomplicated  EVALUATION COMPLEXITY: Low   GOALS: Goals reviewed with patient? Yes  SHORT TERM GOALS: Target date: 10/09/22  Pt to be I with HEP.  Baseline: Goal status: INITIAL  2.  Pt to demonstrate at least 4/5 pelvic floor strength for improved pelvic stability and decreased strain at pelvic floor/ decrease leakage.  Baseline:  Goal status: INITIAL  3.  Pt to demonstrate improved coordination of pelvic floor and breathing mechanics with body weight squat with appropriate synergistic patterns to decrease pain and leakage at least 25% of the time.    Baseline:  Goal status: INITIAL  4.  Pt will have to use 3 diapers per day for improved skin integrity.  Baseline:  Goal status: INITIAL  5.  Pt to demonstrate improved standing balance with improved single leg stance time to at least 5s on either leg to decreased fall risk while ambulating to/from restroom.  Baseline:  Goal status: INITIAL   LONG TERM GOALS: Target date: 12/11/22  Pt to be  I with advanced HEP.  Baseline:  Goal status: INITIAL  2.  Pt to demonstrate at least 4/5 pelvic floor strength and ability to hold isometric for 8s for improved pelvic stability and decreased strain at pelvic floor/ decrease leakage.  Baseline:  Goal status: INITIAL  3.   Pt to demonstrate improved coordination of pelvic floor and breathing mechanics with body weight squat with appropriate synergistic patterns to decrease pain and leakage at least 50% of the time.   Baseline:  Goal status: INITIAL  4.  Pt will have to use 2 diapers per day for improved skin integrity.  Baseline:  Goal status: INITIAL  5.  Pt to demonstrate improved standing balance with improved single leg stance time to at least 8s on either leg to decreased fall risk while ambulating to/from restroom.  Baseline:  Goal status: INITIAL  6.  Pt to demonstrate  at least 5/5 bil hip strength for improved pelvic stability and functional squats without leakage.  Baseline:  Goal status: INITIAL  PLAN:  PT FREQUENCY: 1x/week  PT DURATION: 12 weeks  PLANNED INTERVENTIONS: Therapeutic exercises, Therapeutic activity, Neuromuscular re-education, Balance training, Gait training, Patient/Family education, Self Care, Aquatic Therapy, Spinal mobilization, Cryotherapy, Moist heat, scar mobilization, Taping, Biofeedback, and Manual therapy  PLAN FOR NEXT SESSION: core and hip strengthening, pelvic floor mobility and strengthening, bladder retraining, balance training with coordination of pelvic floor activity  Otelia Sergeant, PT, DPT 09/25/2410:24 PM

## 2022-10-02 ENCOUNTER — Ambulatory Visit: Payer: PPO | Admitting: Physical Therapy

## 2022-10-09 ENCOUNTER — Ambulatory Visit: Payer: PPO | Attending: Family Medicine | Admitting: Physical Therapy

## 2022-10-09 DIAGNOSIS — R293 Abnormal posture: Secondary | ICD-10-CM | POA: Insufficient documentation

## 2022-10-09 DIAGNOSIS — M6281 Muscle weakness (generalized): Secondary | ICD-10-CM | POA: Insufficient documentation

## 2022-10-09 DIAGNOSIS — R279 Unspecified lack of coordination: Secondary | ICD-10-CM | POA: Insufficient documentation

## 2022-10-10 DIAGNOSIS — R32 Unspecified urinary incontinence: Secondary | ICD-10-CM | POA: Diagnosis not present

## 2022-10-10 DIAGNOSIS — F321 Major depressive disorder, single episode, moderate: Secondary | ICD-10-CM | POA: Diagnosis not present

## 2022-10-10 DIAGNOSIS — G629 Polyneuropathy, unspecified: Secondary | ICD-10-CM | POA: Diagnosis not present

## 2022-10-10 DIAGNOSIS — G3184 Mild cognitive impairment, so stated: Secondary | ICD-10-CM | POA: Diagnosis not present

## 2022-10-16 ENCOUNTER — Ambulatory Visit: Payer: PPO | Admitting: Physical Therapy

## 2022-10-16 DIAGNOSIS — M6281 Muscle weakness (generalized): Secondary | ICD-10-CM

## 2022-10-16 DIAGNOSIS — R293 Abnormal posture: Secondary | ICD-10-CM | POA: Diagnosis not present

## 2022-10-16 DIAGNOSIS — R279 Unspecified lack of coordination: Secondary | ICD-10-CM

## 2022-10-16 NOTE — Therapy (Signed)
OUTPATIENT PHYSICAL THERAPY FEMALE PELVIC TREATMENT   Patient Name: Zoe Kim MRN: [id number] DOB:1945-11-15, 77 y.o., female Today's Date: 10/16/2022  END OF SESSION:  PT End of Session - 10/16/22 1113     Visit Number 4    Date for PT Re-Evaluation 12/11/22    Authorization Type [employer]    PT Start Time 1106   arrival time   PT Stop Time 1145    PT Time Calculation (min) 39 min    Activity Tolerance Patient tolerated treatment well    Behavior During Therapy [facility] for tasks assessed/performed               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   Anemia due to antineoplastic chemotherapy 12/24/2018   Arthritis    Atherosclerosis of aorta 05/23/2021   Chronic fatigue syndrome 04/25/2021   Decreased estrogen level 04/25/2021   Family history of breast cancer    Family history of lung cancer    GAD (generalized anxiety disorder) 04/25/2021   Heart murmur    "years ago"   History of adenomatous polyp of colon 04/25/2021   History of endometrial cancer    Insomnia    Major depressive disorder 04/25/2021   Neoplasm of uncertain behavior of skin    Obesity 10/14/2018   Peripheral neuropathy due to chemotherapy 11/20/2018   Personal history of malignant neoplasm of other parts of uterus 04/25/2021   Pneumonia    "long time ago"   Primary localized osteoarthritis of right knee 07/30/2016   Urge incontinence of urine 04/25/2021   Vitamin D deficiency 04/25/2021   Past Surgical History:  Procedure Laterality Date   COLONOSCOPY     EYE SURGERY Bilateral    cataract with lens   IR IMAGING GUIDED PORT INSERTION  11/06/2018   IR REMOVAL TUN ACCESS W/ PORT W/O FL MOD SED  02/13/2019   ROBOTIC ASSISTED TOTAL HYSTERECTOMY WITH BILATERAL SALPINGO OOPHERECTOMY N/A 10/14/2018   Procedure: ROBOTIC ASSISTED TOTAL HYSTERECTOMY WITH BILATERAL SALPINGO OOPHORECTOMY;  Surgeon: Adolphus Birchwood, MD;   Location: [facility];  Service: Gynecology;  Laterality: N/A;   SENTINEL NODE BIOPSY N/A 10/14/2018   Procedure: SENTINEL LYMPH NODE BIOPSY;  Surgeon: Adolphus Birchwood, MD;  Location: [facility];  Service: Gynecology;  Laterality: N/A;   TONSILLECTOMY     TOOTH EXTRACTION Right 08/27/2017   TOTAL KNEE ARTHROPLASTY Right 07/30/2016   Procedure: TOTAL KNEE ARTHROPLASTY;  Surgeon: Salvatore Marvel, MD;  Location: [facility] OR;  Service: Orthopedics;  Laterality: Right;   VEIN LIGATION AND STRIPPING     Patient Active Problem List   Diagnosis Date Noted   Neoplasm of uncertain behavior of skin    Amnestic MCI (mild cognitive impairment with memory loss) 08/30/2021   Atherosclerosis of aorta 05/23/2021   Major depressive disorder 04/25/2021   Personal history of malignant neoplasm of other parts of uterus 04/25/2021   Urge incontinence of urine 04/25/2021   Vitamin D deficiency 04/25/2021   History of adenomatous polyp of colon 04/25/2021   Decreased estrogen level 04/25/2021   Chronic fatigue syndrome 04/25/2021   GAD (generalized anxiety disorder) 04/25/2021   Anemia due to antineoplastic chemotherapy 12/24/2018   Peripheral neuropathy due to chemotherapy 11/20/2018   Family history of breast cancer    Family history of lung cancer    Obesity 10/14/2018   Primary localized osteoarthritis of right knee 07/30/2016    PCP: Shon Hale, MD  REFERRING  PROVIDER: Camie Patience, FNP  REFERRING DIAG: R32 (ICD-10-CM) - Unspecified urinary incontinence  THERAPY DIAG:  Abnormal posture  Unspecified lack of coordination  Muscle weakness (generalized)  Rationale for Evaluation and Treatment: Rehabilitation  ONSET DATE: few years ago   SUBJECTIVE:                                                                                                                                                                                           SUBJECTIVE STATEMENT: Pt reports she has been dealing with her  recurrent depression lately and has told her doctor about this, started a new medication. Pt reports while she was doing the kegels with activity while working with her horse she was having much less leakage, however with "sitting a lot and feeling sorry for myself, I haven't been doing a lot." And states with not practicing  the kegels leakage has returned.   Fluid intake: Yes: ~2-3 16oz bottles per day    PAIN:  Are you having pain? No  PRECAUTIONS: None  WEIGHT BEARING RESTRICTIONS: No  FALLS:  Has patient fallen in last 6 months? No  LIVING ENVIRONMENT: Lives with: lives with their family Lives in: House/apartment    OCCUPATION: retired  PLOF: Independent  PATIENT GOALS: to be more active with less leakage  PERTINENT HISTORY:  mild cognitive impairment, endometrial cancer, Urge incontinence of urine,  Sexual abuse: No  BOWEL MOVEMENT: Pain with bowel movement: No Type of bowel movement:Type (Bristol Stool Scale) 1-2, Frequency 1x/5days, and Strain Yes Fully empty rectum: Yes:   Leakage: No Pads: No Fiber supplement: No  URINATION: Pain with urination: No Fully empty bladder: Yes:   Stream: Strong and Weak Urgency:no Frequency: gets an urge 1-2x per day, goes to the bathroom about every 3 hours due to feeling wet diaper Leakage:  doesn't notice leakage with tasks and unknown when she does leak Pads: Yes: several diapers during the day   INTERCOURSE: Pain with intercourse:  not active Ability to have vaginal penetration:  Yes:   Climax: doesn't try Marinoff Scale: 0/3  PREGNANCY: Vaginal deliveries 0  C-section deliveries 0 Currently pregnant No  PROLAPSE: No    OBJECTIVE:   DIAGNOSTIC FINDINGS:    COGNITION: Overall cognitive status:  per chart review and pt endorses with memory problems, pt states she has "first stage dementia"      SENSATION: Light touch: Appears intact Proprioception: Appears intact  MUSCLE LENGTH: Bil hamstrings and  adductors limited by 50%   FUNCTIONAL TESTS:  5 times sit to stand: 10.54s Single leg stance - 2s Rt 4s Lt - heavy sway and multiple  attempts    POSTURE: rounded shoulders, forward head, and posterior pelvic tilt   LUMBARAROM/PROM:  A/PROM A/PROM  eval  Flexion WFL  Extension WFL  Right lateral flexion Limited by 50%  Left lateral flexion Limited by 50%  Right rotation Limited by 25%  Left rotation Limited by 25%   (Blank rows = not tested)  LOWER EXTREMITY ROM:  WFL  LOWER EXTREMITY MMT:   Rt hip grossly 4/5, Lt hip 3+/5, knees 4/5.   PALPATION:   General  no TTP but did have tightness noted in upper quadrants of abdomen                External Perineal Exam mild dryness and redness noted. Pt reports she feels like she has urine present "all the time".                              Internal Pelvic Floor reported slight tightness at superficial right and left layers. Pt did have scant bleeding noted on glove post internal assessment. Pt denied any pain, informed of blood noted and reports she hasn't had spotting and educated to inform her doctor if this continues. Pt agreed.   Patient confirms identification and approves PT to assess internal pelvic floor and treatment Yes  PELVIC MMT:   MMT eval  Vaginal 3/5, 5reps, 7s  Internal Anal Sphincter   External Anal Sphincter   Puborectalis   Diastasis Recti   (Blank rows = not tested)        TONE: Mild decreased   PROLAPSE: Not noted in hooklying   TODAY'S TREATMENT:                                                                                                                              DATE:  10/16/22: NMRE: all exercises cued for coordination of breathing mechanics, core activation, and pelvic floor mobility for improved strength and coordination of pelvic floor Nustep 8 mins L5 for increased activity at pelvic floor with reciprocal bil mobility and no leakage X10 Sit to stand with ball squeeze, x10 Sit  to stand with 6# bil all from mat table  X10 heel raises with bil UE support X10 single leg heel raises with bil UE support Farmers carry 6# 500' total leakage reported at 150', x5 pelvic floor contractions completed then no return of leakage (switched hands with weight at halfway) Farmers carry with 6# handweight held with resistance loop for dynamic movement of resistance 500' no leakage with cues for technique but worsening lateral leaning opp weight compared to held weight Standing alt OHP 3# x10 each   09/25/2022  NMRE: all exercises cued for coordination of breathing mechanics, core activation, and pelvic floor mobility for improved strength and coordination of pelvic floor 3x10 seated pelvic floor contractions with exhale  2x10 Sit to stands from standard chair Walking with intermittent pelvic floor contractions with stops  if urge to leak or leakage occurs to do x5 quick contractions. Pt had one leak initially with first few feet of walking but with quick contractions with urge no additional leakage instances.  X10 downward diagonal 8# rows each side to try to simulate shoveling which pt reports she has leakage with for horse care.  Extra time throughout session needed for cues, education with pt's limited memory for new information. Pt reports reps and having her exercises printed really helps her remember and doing tasks that are functional for her helps.   PATIENT EDUCATION:  Education details: [id number] Person educated: Patient Education method: Explanation, Demonstration, Tactile cues, Verbal cues, and Handouts Education comprehension: verbalized understanding and returned demonstration  HOME EXERCISE PROGRAM: [id number]  ASSESSMENT:  CLINICAL IMPRESSION: Patient  presented to clinic for treatment session. Session focused on coordination of pelvic floor with breathing and with activity to decreased leakage with stressors and urge. Pt tolerated well did have two instance of leakage  with walking with weight variations but with cues stopped and improved. Pt benefits from cues with technique and coordination throughout. Pt is limited with short term memory difficulties and benefits from review and with functional tasks for carry over for home. Pt would benefit from additional PT to further address deficits.      OBJECTIVE IMPAIRMENTS: decreased activity tolerance, decreased balance, decreased coordination, decreased endurance, decreased mobility, decreased strength, increased fascial restrictions, impaired flexibility, improper body mechanics, and postural dysfunction.   ACTIVITY LIMITATIONS: lifting, standing, squatting, stairs, continence, and locomotion level  PARTICIPATION LIMITATIONS: cleaning, interpersonal relationship, shopping, community activity, and yard work  PERSONAL FACTORS: Fitness, Time since onset of injury/illness/exacerbation, and 1 comorbidity: medical history  are also affecting patient's functional outcome.   REHAB POTENTIAL: Good  CLINICAL DECISION MAKING: Stable/uncomplicated  EVALUATION COMPLEXITY: Low   GOALS: Goals reviewed with patient? Yes  SHORT TERM GOALS: Target date: 10/09/22  Pt to be I with HEP.  Baseline: Goal status: INITIAL  2.  Pt to demonstrate at least 4/5 pelvic floor strength for improved pelvic stability and decreased strain at pelvic floor/ decrease leakage.  Baseline:  Goal status: INITIAL  3.  Pt to demonstrate improved coordination of pelvic floor and breathing mechanics with body weight squat with appropriate synergistic patterns to decrease pain and leakage at least 25% of the time.    Baseline:  Goal status: INITIAL  4.  Pt will have to use 3 diapers per day for improved skin integrity.  Baseline:  Goal status: INITIAL  5.  Pt to demonstrate improved standing balance with improved single leg stance time to at least 5s on either leg to decreased fall risk while ambulating to/from restroom.  Baseline:  Goal  status: INITIAL   LONG TERM GOALS: Target date: 12/11/22  Pt to be I with advanced HEP.  Baseline:  Goal status: INITIAL  2.  Pt to demonstrate at least 4/5 pelvic floor strength and ability to hold isometric for 8s for improved pelvic stability and decreased strain at pelvic floor/ decrease leakage.  Baseline:  Goal status: INITIAL  3.   Pt to demonstrate improved coordination of pelvic floor and breathing mechanics with body weight squat with appropriate synergistic patterns to decrease pain and leakage at least 50% of the time.   Baseline:  Goal status: INITIAL  4.  Pt will have to use 2 diapers per day for improved skin integrity.  Baseline:  Goal status: INITIAL  5.  Pt to demonstrate improved standing balance with improved single  leg stance time to at least 8s on either leg to decreased fall risk while ambulating to/from restroom.  Baseline:  Goal status: INITIAL  6.  Pt to demonstrate at least 5/5 bil hip strength for improved pelvic stability and functional squats without leakage.  Baseline:  Goal status: INITIAL  PLAN:  PT FREQUENCY: 1x/week  PT DURATION: 12 weeks  PLANNED INTERVENTIONS: Therapeutic exercises, Therapeutic activity, Neuromuscular re-education, Balance training, Gait training, Patient/Family education, Self Care, Aquatic Therapy, Spinal mobilization, Cryotherapy, Moist heat, scar mobilization, Taping, Biofeedback, and Manual therapy  PLAN FOR NEXT SESSION: core and hip strengthening, pelvic floor mobility and strengthening, bladder retraining, balance training with coordination of pelvic floor activity  Otelia Sergeant, PT, DPT 10/15/2409:50 AM

## 2022-10-17 ENCOUNTER — Encounter: Payer: PPO | Admitting: Psychology

## 2022-10-23 ENCOUNTER — Encounter: Payer: Self-pay | Admitting: Physical Therapy

## 2022-10-23 ENCOUNTER — Ambulatory Visit: Payer: PPO | Admitting: Physical Therapy

## 2022-10-23 DIAGNOSIS — R293 Abnormal posture: Secondary | ICD-10-CM

## 2022-10-23 DIAGNOSIS — M6281 Muscle weakness (generalized): Secondary | ICD-10-CM

## 2022-10-23 DIAGNOSIS — R279 Unspecified lack of coordination: Secondary | ICD-10-CM

## 2022-10-23 NOTE — Therapy (Signed)
OUTPATIENT PHYSICAL THERAPY FEMALE PELVIC TREATMENT   Patient Name: Zoe Kim MRN: [id number] DOB:08-13-1945, 77 y.o., female Today's Date: 10/23/2022  END OF SESSION:  PT End of Session - 10/23/22 1047     Visit Number 5    Date for PT Re-Evaluation 12/11/22    Authorization Type [employer]    PT Start Time 1100    PT Stop Time 1143    PT Time Calculation (min) 43 min    Activity Tolerance Patient tolerated treatment well    Behavior During Therapy WFL for tasks assessed/performed               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   Anemia due to antineoplastic chemotherapy 12/24/2018   Arthritis    Atherosclerosis of aorta 05/23/2021   Chronic fatigue syndrome 04/25/2021   Decreased estrogen level 04/25/2021   Family history of breast cancer    Family history of lung cancer    GAD (generalized anxiety disorder) 04/25/2021   Heart murmur    "years ago"   History of adenomatous polyp of colon 04/25/2021   History of endometrial cancer    Insomnia    Major depressive disorder 04/25/2021   Neoplasm of uncertain behavior of skin    Obesity 10/14/2018   Peripheral neuropathy due to chemotherapy 11/20/2018   Personal history of malignant neoplasm of other parts of uterus 04/25/2021   Pneumonia    "long time ago"   Primary localized osteoarthritis of right knee 07/30/2016   Urge incontinence of urine 04/25/2021   Vitamin D deficiency 04/25/2021   Past Surgical History:  Procedure Laterality Date   COLONOSCOPY     EYE SURGERY Bilateral    cataract with lens   IR IMAGING GUIDED PORT INSERTION  11/06/2018   IR REMOVAL TUN ACCESS W/ PORT W/O FL MOD SED  02/13/2019   ROBOTIC ASSISTED TOTAL HYSTERECTOMY WITH BILATERAL SALPINGO OOPHERECTOMY N/A 10/14/2018   Procedure: ROBOTIC ASSISTED TOTAL HYSTERECTOMY WITH BILATERAL SALPINGO OOPHORECTOMY;  Surgeon: Adolphus Birchwood, MD;  Location: [facility]  ORS;  Service: Gynecology;  Laterality: N/A;   SENTINEL NODE BIOPSY N/A 10/14/2018   Procedure: SENTINEL LYMPH NODE BIOPSY;  Surgeon: Adolphus Birchwood, MD;  Location: [facility] ORS;  Service: Gynecology;  Laterality: N/A;   TONSILLECTOMY     TOOTH EXTRACTION Right 08/27/2017   TOTAL KNEE ARTHROPLASTY Right 07/30/2016   Procedure: TOTAL KNEE ARTHROPLASTY;  Surgeon: Salvatore Marvel, MD;  Location: [facility] OR;  Service: Orthopedics;  Laterality: Right;   VEIN LIGATION AND STRIPPING     Patient Active Problem List   Diagnosis Date Noted   Neoplasm of uncertain behavior of skin    Amnestic MCI (mild cognitive impairment with memory loss) 08/30/2021   Atherosclerosis of aorta 05/23/2021   Major depressive disorder 04/25/2021   Personal history of malignant neoplasm of other parts of uterus 04/25/2021   Urge incontinence of urine 04/25/2021   Vitamin D deficiency 04/25/2021   History of adenomatous polyp of colon 04/25/2021   Decreased estrogen level 04/25/2021   Chronic fatigue syndrome 04/25/2021   GAD (generalized anxiety disorder) 04/25/2021   Anemia due to antineoplastic chemotherapy 12/24/2018   Peripheral neuropathy due to chemotherapy 11/20/2018   Family history of breast cancer    Family history of lung cancer    Obesity 10/14/2018   Primary localized osteoarthritis of right knee 07/30/2016    PCP: Shon Hale, MD  REFERRING PROVIDER: Jorge Ny  S, FNP  REFERRING DIAG: R32 (ICD-10-CM) - Unspecified urinary incontinence  THERAPY DIAG:  Abnormal posture  Unspecified lack of coordination  Muscle weakness (generalized)  Rationale for Evaluation and Treatment: Rehabilitation  ONSET DATE: few years ago   SUBJECTIVE:                                                                                                                                                                                           SUBJECTIVE STATEMENT: Has been practicing kegels for the first few days after  last session and this seemed to help with leakage but then sort of forgot and leakage returned again. Still using around 5 diapers per day. Reports she sometimes remembers to do them and it can stop leakage but when she doesn't remember just notices when dampness felt.   Fluid intake: Yes: ~2-3 16oz bottles per day    PAIN:  Are you having pain? No  PRECAUTIONS: None  WEIGHT BEARING RESTRICTIONS: No  FALLS:  Has patient fallen in last 6 months? No  LIVING ENVIRONMENT: Lives with: lives with their family Lives in: House/apartment    OCCUPATION: retired  PLOF: Independent  PATIENT GOALS: to be more active with less leakage  PERTINENT HISTORY:  mild cognitive impairment, endometrial cancer, Urge incontinence of urine,  Sexual abuse: No  BOWEL MOVEMENT: Pain with bowel movement: No Type of bowel movement:Type (Bristol Stool Scale) 1-2, Frequency 1x/5days, and Strain Yes Fully empty rectum: Yes:   Leakage: No Pads: No Fiber supplement: No  URINATION: Pain with urination: No Fully empty bladder: Yes:   Stream: Strong and Weak Urgency:no Frequency: gets an urge 1-2x per day, goes to the bathroom about every 3 hours due to feeling wet diaper Leakage:  doesn't notice leakage with tasks and unknown when she does leak Pads: Yes: several diapers during the day   INTERCOURSE: Pain with intercourse:  not active Ability to have vaginal penetration:  Yes:   Climax: doesn't try Marinoff Scale: 0/3  PREGNANCY: Vaginal deliveries 0  C-section deliveries 0 Currently pregnant No  PROLAPSE: No    OBJECTIVE:   DIAGNOSTIC FINDINGS:    COGNITION: Overall cognitive status:  per chart review and pt endorses with memory problems, pt states she has "first stage dementia"      SENSATION: Light touch: Appears intact Proprioception: Appears intact  MUSCLE LENGTH: Bil hamstrings and adductors limited by 50%   FUNCTIONAL TESTS:  5 times sit to stand: 10.54s Single leg  stance - 2s Rt 4s Lt - heavy sway and multiple attempts    POSTURE: rounded shoulders, forward head, and posterior pelvic tilt   LUMBARAROM/PROM:  A/PROM A/PROM  eval  Flexion WFL  Extension WFL  Right lateral flexion Limited by 50%  Left lateral flexion Limited by 50%  Right rotation Limited by 25%  Left rotation Limited by 25%   (Blank rows = not tested)  LOWER EXTREMITY ROM:  WFL  LOWER EXTREMITY MMT:   Rt hip grossly 4/5, Lt hip 3+/5, knees 4/5.   PALPATION:   General  no TTP but did have tightness noted in upper quadrants of abdomen                External Perineal Exam mild dryness and redness noted. Pt reports she feels like she has urine present "all the time".                              Internal Pelvic Floor reported slight tightness at superficial right and left layers. Pt did have scant bleeding noted on glove post internal assessment. Pt denied any pain, informed of blood noted and reports she hasn't had spotting and educated to inform her doctor if this continues. Pt agreed.   Patient confirms identification and approves PT to assess internal pelvic floor and treatment Yes  PELVIC MMT:   MMT eval  Vaginal 3/5, 5reps, 7s  Internal Anal Sphincter   External Anal Sphincter   Puborectalis   Diastasis Recti   (Blank rows = not tested)        TONE: Mild decreased   PROLAPSE: Not noted in hooklying   TODAY'S TREATMENT:                                                                                                                              DATE:  10/16/22: NMRE: all exercises cued for coordination of breathing mechanics, core activation, and pelvic floor mobility for improved strength and coordination of pelvic floor Nustep 8 mins L5 for increased activity at pelvic floor with reciprocal bil mobility and no leakage X10 Sit to stand with ball squeeze, x10 Sit to stand with 6# bil all from mat table  X10 heel raises with bil UE support X10 single  leg heel raises with bil UE support Farmers carry 6# 500' total leakage reported at 150', x5 pelvic floor contractions completed then no return of leakage (switched hands with weight at halfway) Farmers carry with 6# handweight held with resistance loop for dynamic movement of resistance 500' no leakage with cues for technique but worsening lateral leaning opp weight compared to held weight Standing alt OHP 3# x10 each  10/23/22: NMRE: all exercises cued for coordination of breathing mechanics, core activation, and pelvic floor mobility for improved strength and coordination of pelvic floor  Nustep 8 mins L5 for increased activity at pelvic floor with reciprocal bil mobility and no leakage Farmers carry 9# 300' x4 switching hands each time leakage reported at second round, x5 pelvic floor contractions completed then no return of leakage  4# x8 each side for OHP and cross body punch Marching in standing x10 each UE at chair back Standing hip abduction x10 each each UE at chair back   PATIENT EDUCATION:  Education details: [id number] Person educated: Patient Education method: Explanation, Demonstration, Tactile cues, Verbal cues, and Handouts Education comprehension: verbalized understanding and returned demonstration  HOME EXERCISE PROGRAM: [id number]  ASSESSMENT:  CLINICAL IMPRESSION: Patient  presented to clinic for treatment session. Session focused on coordination of pelvic floor with breathing and with activity to decreased leakage with stressors and urge. Pt tolerated well did have two instances of leakage with farmer's carry and once with hip abduction initially but decreased with cues and a brief rest break. Pt benefits from cues with technique and coordination throughout. Pt is limited with short term memory difficulties and benefits from review and with functional tasks for carry over for home. Pt would benefit from additional PT to further address deficits.      OBJECTIVE  IMPAIRMENTS: decreased activity tolerance, decreased balance, decreased coordination, decreased endurance, decreased mobility, decreased strength, increased fascial restrictions, impaired flexibility, improper body mechanics, and postural dysfunction.   ACTIVITY LIMITATIONS: lifting, standing, squatting, stairs, continence, and locomotion level  PARTICIPATION LIMITATIONS: cleaning, interpersonal relationship, shopping, community activity, and yard work  PERSONAL FACTORS: Fitness, Time since onset of injury/illness/exacerbation, and 1 comorbidity: medical history  are also affecting patient's functional outcome.   REHAB POTENTIAL: Good  CLINICAL DECISION MAKING: Stable/uncomplicated  EVALUATION COMPLEXITY: Low   GOALS: Goals reviewed with patient? Yes  SHORT TERM GOALS: Target date: 10/09/22  Pt to be I with HEP.  Baseline: Goal status: MET  2.  Pt to demonstrate at least 4/5 pelvic floor strength for improved pelvic stability and decreased strain at pelvic floor/ decrease leakage.  Baseline:  Goal status: on going  3.  Pt to demonstrate improved coordination of pelvic floor and breathing mechanics with body weight squat with appropriate synergistic patterns to decrease pain and leakage at least 25% of the time.    Baseline:  Goal status: MET  4.  Pt will have to use 3 diapers per day for improved skin integrity.  Baseline:  Goal status: on going  5.  Pt to demonstrate improved standing balance with improved single leg stance time to at least 5s on either leg to decreased fall risk while ambulating to/from restroom.  Baseline:  Goal status: on going   LONG TERM GOALS: Target date: 12/11/22  Pt to be I with advanced HEP.  Baseline:  Goal status: on going  2.  Pt to demonstrate at least 4/5 pelvic floor strength and ability to hold isometric for 8s for improved pelvic stability and decreased strain at pelvic floor/ decrease leakage.  Baseline:  Goal status: on going  3.    Pt to demonstrate improved coordination of pelvic floor and breathing mechanics with body weight squat with appropriate synergistic patterns to decrease pain and leakage at least 50% of the time.   Baseline:  Goal status: on going  4.  Pt will have to use 2 diapers per day for improved skin integrity.  Baseline:  Goal status: on going  5.  Pt to demonstrate improved standing balance with improved single leg stance time to at least 8s on either leg to decreased fall risk while ambulating to/from restroom.  Baseline:  Goal status: on going  6.  Pt to demonstrate at least 5/5 bil hip strength for improved pelvic stability and functional squats without leakage.  Baseline:  Goal  status: on going  PLAN:  PT FREQUENCY: 1x/week  PT DURATION: 12 weeks  PLANNED INTERVENTIONS: Therapeutic exercises, Therapeutic activity, Neuromuscular re-education, Balance training, Gait training, Patient/Family education, Self Care, Aquatic Therapy, Spinal mobilization, Cryotherapy, Moist heat, scar mobilization, Taping, Biofeedback, and Manual therapy  PLAN FOR NEXT SESSION: core and hip strengthening, pelvic floor mobility and strengthening, bladder retraining, balance training with coordination of pelvic floor activity  Otelia Sergeant, PT, DPT 10/22/2409:46 AM

## 2022-10-24 ENCOUNTER — Encounter: Payer: PPO | Admitting: Psychology

## 2022-10-25 DIAGNOSIS — E559 Vitamin D deficiency, unspecified: Secondary | ICD-10-CM | POA: Diagnosis not present

## 2022-10-25 DIAGNOSIS — G629 Polyneuropathy, unspecified: Secondary | ICD-10-CM | POA: Diagnosis not present

## 2022-10-25 DIAGNOSIS — E785 Hyperlipidemia, unspecified: Secondary | ICD-10-CM | POA: Diagnosis not present

## 2022-10-25 DIAGNOSIS — Z23 Encounter for immunization: Secondary | ICD-10-CM | POA: Diagnosis not present

## 2022-10-25 DIAGNOSIS — Z9181 History of falling: Secondary | ICD-10-CM | POA: Diagnosis not present

## 2022-10-25 DIAGNOSIS — Z6831 Body mass index (BMI) 31.0-31.9, adult: Secondary | ICD-10-CM | POA: Diagnosis not present

## 2022-10-25 DIAGNOSIS — N3941 Urge incontinence: Secondary | ICD-10-CM | POA: Diagnosis not present

## 2022-10-25 DIAGNOSIS — F321 Major depressive disorder, single episode, moderate: Secondary | ICD-10-CM | POA: Diagnosis not present

## 2022-10-25 DIAGNOSIS — E6609 Other obesity due to excess calories: Secondary | ICD-10-CM | POA: Diagnosis not present

## 2022-10-25 DIAGNOSIS — Z789 Other specified health status: Secondary | ICD-10-CM | POA: Diagnosis not present

## 2022-10-25 DIAGNOSIS — Z0000 Encounter for general adult medical examination without abnormal findings: Secondary | ICD-10-CM | POA: Diagnosis not present

## 2022-10-25 DIAGNOSIS — G3184 Mild cognitive impairment, so stated: Secondary | ICD-10-CM | POA: Diagnosis not present

## 2022-10-30 ENCOUNTER — Ambulatory Visit: Payer: PPO | Admitting: Physical Therapy

## 2022-10-30 DIAGNOSIS — M6281 Muscle weakness (generalized): Secondary | ICD-10-CM

## 2022-10-30 DIAGNOSIS — R293 Abnormal posture: Secondary | ICD-10-CM

## 2022-10-30 DIAGNOSIS — R279 Unspecified lack of coordination: Secondary | ICD-10-CM

## 2022-10-30 NOTE — Therapy (Signed)
OUTPATIENT PHYSICAL THERAPY FEMALE PELVIC TREATMENT   Patient Name: Zoe Kim MRN: [id number] DOB:Jun 01, 1946, 77 y.o., female Today's Date: 10/30/2022  END OF SESSION:  PT End of Session - 10/30/22 1101     Visit Number 6    Date for PT Re-Evaluation 12/11/22    Authorization Type [employer]    PT Start Time 1100    PT Stop Time 1143    PT Time Calculation (min) 43 min    Activity Tolerance Patient tolerated treatment well    Behavior During Therapy WFL for tasks assessed/performed               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   Anemia due to antineoplastic chemotherapy 12/24/2018   Arthritis    Atherosclerosis of aorta 05/23/2021   Chronic fatigue syndrome 04/25/2021   Decreased estrogen level 04/25/2021   Family history of breast cancer    Family history of lung cancer    GAD (generalized anxiety disorder) 04/25/2021   Heart murmur    "years ago"   History of adenomatous polyp of colon 04/25/2021   History of endometrial cancer    Insomnia    Major depressive disorder 04/25/2021   Neoplasm of uncertain behavior of skin    Obesity 10/14/2018   Peripheral neuropathy due to chemotherapy 11/20/2018   Personal history of malignant neoplasm of other parts of uterus 04/25/2021   Pneumonia    "long time ago"   Primary localized osteoarthritis of right knee 07/30/2016   Urge incontinence of urine 04/25/2021   Vitamin D deficiency 04/25/2021   Past Surgical History:  Procedure Laterality Date   COLONOSCOPY     EYE SURGERY Bilateral    cataract with lens   IR IMAGING GUIDED PORT INSERTION  11/06/2018   IR REMOVAL TUN ACCESS W/ PORT W/O FL MOD SED  02/13/2019   ROBOTIC ASSISTED TOTAL HYSTERECTOMY WITH BILATERAL SALPINGO OOPHERECTOMY N/A 10/14/2018   Procedure: ROBOTIC ASSISTED TOTAL HYSTERECTOMY WITH BILATERAL SALPINGO OOPHORECTOMY;  Surgeon: Adolphus Birchwood, MD;  Location: [facility]  ORS;  Service: Gynecology;  Laterality: N/A;   SENTINEL NODE BIOPSY N/A 10/14/2018   Procedure: SENTINEL LYMPH NODE BIOPSY;  Surgeon: Adolphus Birchwood, MD;  Location: [facility] ORS;  Service: Gynecology;  Laterality: N/A;   TONSILLECTOMY     TOOTH EXTRACTION Right 08/27/2017   TOTAL KNEE ARTHROPLASTY Right 07/30/2016   Procedure: TOTAL KNEE ARTHROPLASTY;  Surgeon: Salvatore Marvel, MD;  Location: [facility] OR;  Service: Orthopedics;  Laterality: Right;   VEIN LIGATION AND STRIPPING     Patient Active Problem List   Diagnosis Date Noted   Neoplasm of uncertain behavior of skin    Amnestic MCI (mild cognitive impairment with memory loss) 08/30/2021   Atherosclerosis of aorta 05/23/2021   Major depressive disorder 04/25/2021   Personal history of malignant neoplasm of other parts of uterus 04/25/2021   Urge incontinence of urine 04/25/2021   Vitamin D deficiency 04/25/2021   History of adenomatous polyp of colon 04/25/2021   Decreased estrogen level 04/25/2021   Chronic fatigue syndrome 04/25/2021   GAD (generalized anxiety disorder) 04/25/2021   Anemia due to antineoplastic chemotherapy 12/24/2018   Peripheral neuropathy due to chemotherapy 11/20/2018   Family history of breast cancer    Family history of lung cancer    Obesity 10/14/2018   Primary localized osteoarthritis of right knee 07/30/2016    PCP: Shon Hale, MD  REFERRING PROVIDER: Jorge Ny  S, FNP  REFERRING DIAG: R32 (ICD-10-CM) - Unspecified urinary incontinence  THERAPY DIAG:  Abnormal posture  Unspecified lack of coordination  Muscle weakness (generalized)  Rationale for Evaluation and Treatment: Rehabilitation  ONSET DATE: few years ago   SUBJECTIVE:                                                                                                                                                                                           SUBJECTIVE STATEMENT: Pt reports she has noticed improvement with leakage now  only using 3-4 diapers per day instead of 6. Noted she was less wet after doing horse chores this week too.   Fluid intake: Yes: ~2-3 16oz bottles per day    PAIN:  Are you having pain? Yes - bil neuropathy in legs is bothering her today 7/10 discomfort reported by pt  PRECAUTIONS: None  WEIGHT BEARING RESTRICTIONS: No  FALLS:  Has patient fallen in last 6 months? No  LIVING ENVIRONMENT: Lives with: lives with their family Lives in: House/apartment    OCCUPATION: retired  PLOF: Independent  PATIENT GOALS: to be more active with less leakage  PERTINENT HISTORY:  mild cognitive impairment, endometrial cancer, Urge incontinence of urine,  Sexual abuse: No  BOWEL MOVEMENT: Pain with bowel movement: No Type of bowel movement:Type (Bristol Stool Scale) 1-2, Frequency 1x/5days, and Strain Yes Fully empty rectum: Yes:   Leakage: No Pads: No Fiber supplement: No  URINATION: Pain with urination: No Fully empty bladder: Yes:   Stream: Strong and Weak Urgency:no Frequency: gets an urge 1-2x per day, goes to the bathroom about every 3 hours due to feeling wet diaper Leakage:  doesn't notice leakage with tasks and unknown when she does leak Pads: Yes: several diapers during the day   INTERCOURSE: Pain with intercourse:  not active Ability to have vaginal penetration:  Yes:   Climax: doesn't try Marinoff Scale: 0/3  PREGNANCY: Vaginal deliveries 0  C-section deliveries 0 Currently pregnant No  PROLAPSE: No    OBJECTIVE:   DIAGNOSTIC FINDINGS:    COGNITION: Overall cognitive status:  per chart review and pt endorses with memory problems, pt states she has "first stage dementia"      SENSATION: Light touch: Appears intact Proprioception: Appears intact  MUSCLE LENGTH: Bil hamstrings and adductors limited by 50%   FUNCTIONAL TESTS:  5 times sit to stand: 10.54s Single leg stance - 2s Rt 4s Lt - heavy sway and multiple attempts    POSTURE: rounded  shoulders, forward head, and posterior pelvic tilt   LUMBARAROM/PROM:  A/PROM A/PROM  eval  Flexion WFL  Extension WFL  Right  lateral flexion Limited by 50%  Left lateral flexion Limited by 50%  Right rotation Limited by 25%  Left rotation Limited by 25%   (Blank rows = not tested)  LOWER EXTREMITY ROM:  WFL  LOWER EXTREMITY MMT:   Rt hip grossly 4/5, Lt hip 3+/5, knees 4/5.   PALPATION:   General  no TTP but did have tightness noted in upper quadrants of abdomen                External Perineal Exam mild dryness and redness noted. Pt reports she feels like she has urine present "all the time".                              Internal Pelvic Floor reported slight tightness at superficial right and left layers. Pt did have scant bleeding noted on glove post internal assessment. Pt denied any pain, informed of blood noted and reports she hasn't had spotting and educated to inform her doctor if this continues. Pt agreed.   Patient confirms identification and approves PT to assess internal pelvic floor and treatment Yes  PELVIC MMT:   MMT eval  Vaginal 3/5, 5reps, 7s  Internal Anal Sphincter   External Anal Sphincter   Puborectalis   Diastasis Recti   (Blank rows = not tested)        TONE: Mild decreased   PROLAPSE: Not noted in hooklying   TODAY'S TREATMENT:                                                                                                                              DATE:  10/23/22: NMRE: all exercises cued for coordination of breathing mechanics, core activation, and pelvic floor mobility for improved strength and coordination of pelvic floor Nustep 8 mins L5 for increased activity at pelvic floor with reciprocal bil mobility and no leakage Farmers carry 9# 300' x4 switching hands each time leakage reported at second round, x5 pelvic floor contractions completed then no return of leakage 4# x8 each side for OHP and cross body punch Marching in  standing x10 each UE at chair back Standing hip abduction x10 each each UE at chair back  10/30/22:  NMRE: all exercises cued for coordination of breathing mechanics, core activation, and pelvic floor mobility for improved strength and coordination of pelvic floor Nustep 8 mins L5 for increased activity at pelvic floor with reciprocal bil mobility and no leakage 3# unilateral OHP then punch out x10 each 5# diagonals in standing opp reach down and press away x10 Cross body swings 5# x10 X10 squats 10# Farmers carry 10#+5# switched halfway 1000'   PATIENT EDUCATION:  Education details: [id number] Person educated: Patient Education method: Explanation, Demonstration, Tactile cues, Verbal cues, and Handouts Education comprehension: verbalized understanding and returned demonstration  HOME EXERCISE PROGRAM: [id number]  ASSESSMENT:  CLINICAL IMPRESSION: Patient presented to clinic for treatment session. Session focused  on coordination of pelvic floor with breathing and with activity to decreased leakage with stressors and urge. Pt tolerated well didn't notice any leakage today, reports she feels like her energy is getter better and more consistent workouts have bene helping with that and feels like it is helping with leakage too. Increased challenge today with all exercises standing and increasing resistance when tolerated. Pt benefits from cues with technique and coordination throughout. Pt is limited with short term memory difficulties and benefits from review and with functional tasks for carry over for home. Pt would benefit from additional PT to further address deficits.      OBJECTIVE IMPAIRMENTS: decreased activity tolerance, decreased balance, decreased coordination, decreased endurance, decreased mobility, decreased strength, increased fascial restrictions, impaired flexibility, improper body mechanics, and postural dysfunction.   ACTIVITY LIMITATIONS: lifting, standing, squatting,  stairs, continence, and locomotion level  PARTICIPATION LIMITATIONS: cleaning, interpersonal relationship, shopping, community activity, and yard work  PERSONAL FACTORS: Fitness, Time since onset of injury/illness/exacerbation, and 1 comorbidity: medical history  are also affecting patient's functional outcome.   REHAB POTENTIAL: Good  CLINICAL DECISION MAKING: Stable/uncomplicated  EVALUATION COMPLEXITY: Low   GOALS: Goals reviewed with patient? Yes  SHORT TERM GOALS: Target date: 10/09/22  Pt to be I with HEP.  Baseline: Goal status: MET  2.  Pt to demonstrate at least 4/5 pelvic floor strength for improved pelvic stability and decreased strain at pelvic floor/ decrease leakage.  Baseline:  Goal status: on going  3.  Pt to demonstrate improved coordination of pelvic floor and breathing mechanics with body weight squat with appropriate synergistic patterns to decrease pain and leakage at least 25% of the time.    Baseline:  Goal status: MET  4.  Pt will have to use 3 diapers per day for improved skin integrity.  Baseline:  Goal status: on going  5.  Pt to demonstrate improved standing balance with improved single leg stance time to at least 5s on either leg to decreased fall risk while ambulating to/from restroom.  Baseline:  Goal status: on going   LONG TERM GOALS: Target date: 12/11/22  Pt to be I with advanced HEP.  Baseline:  Goal status: on going  2.  Pt to demonstrate at least 4/5 pelvic floor strength and ability to hold isometric for 8s for improved pelvic stability and decreased strain at pelvic floor/ decrease leakage.  Baseline:  Goal status: on going  3.   Pt to demonstrate improved coordination of pelvic floor and breathing mechanics with body weight squat with appropriate synergistic patterns to decrease pain and leakage at least 50% of the time.   Baseline:  Goal status: on going  4.  Pt will have to use 2 diapers per day for improved skin integrity.   Baseline:  Goal status: on going  5.  Pt to demonstrate improved standing balance with improved single leg stance time to at least 8s on either leg to decreased fall risk while ambulating to/from restroom.  Baseline:  Goal status: on going  6.  Pt to demonstrate at least 5/5 bil hip strength for improved pelvic stability and functional squats without leakage.  Baseline:  Goal status: on going  PLAN:  PT FREQUENCY: 1x/week  PT DURATION: 12 weeks  PLANNED INTERVENTIONS: Therapeutic exercises, Therapeutic activity, Neuromuscular re-education, Balance training, Gait training, Patient/Family education, Self Care, Aquatic Therapy, Spinal mobilization, Cryotherapy, Moist heat, scar mobilization, Taping, Biofeedback, and Manual therapy  PLAN FOR NEXT SESSION: core and hip strengthening, pelvic floor mobility  and strengthening, bladder retraining, balance training with coordination of pelvic floor activity  Otelia Sergeant, PT, DPT 10/29/2409:43 AM

## 2022-10-31 ENCOUNTER — Other Ambulatory Visit: Payer: Self-pay | Admitting: Physician Assistant

## 2022-11-06 ENCOUNTER — Ambulatory Visit: Payer: PPO | Attending: Family Medicine | Admitting: Physical Therapy

## 2022-11-06 DIAGNOSIS — M6281 Muscle weakness (generalized): Secondary | ICD-10-CM | POA: Insufficient documentation

## 2022-11-06 DIAGNOSIS — R293 Abnormal posture: Secondary | ICD-10-CM | POA: Insufficient documentation

## 2022-11-06 DIAGNOSIS — R279 Unspecified lack of coordination: Secondary | ICD-10-CM | POA: Insufficient documentation

## 2022-11-13 ENCOUNTER — Ambulatory Visit: Payer: PPO | Admitting: Physical Therapy

## 2022-11-20 ENCOUNTER — Ambulatory Visit: Payer: PPO | Admitting: Physical Therapy

## 2022-11-20 DIAGNOSIS — R279 Unspecified lack of coordination: Secondary | ICD-10-CM | POA: Diagnosis not present

## 2022-11-20 DIAGNOSIS — M6281 Muscle weakness (generalized): Secondary | ICD-10-CM

## 2022-11-20 DIAGNOSIS — R293 Abnormal posture: Secondary | ICD-10-CM

## 2022-11-20 NOTE — Therapy (Signed)
OUTPATIENT PHYSICAL THERAPY FEMALE PELVIC TREATMENT   Patient Name: Zoe Kim MRN: [id number] DOB:10-Jan-1946, 77 y.o., female Today's Date: 11/20/2022  END OF SESSION:  PT End of Session - 11/20/22 1104     Visit Number 7    Date for PT Re-Evaluation 12/11/22    Authorization Type [employer]    PT Start Time 1100    PT Stop Time 1140    PT Time Calculation (min) 40 min    Activity Tolerance Patient tolerated treatment well    Behavior During Therapy WFL for tasks assessed/performed               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   Anemia due to antineoplastic chemotherapy 12/24/2018   Arthritis    Atherosclerosis of aorta 05/23/2021   Chronic fatigue syndrome 04/25/2021   Decreased estrogen level 04/25/2021   Family history of breast cancer    Family history of lung cancer    GAD (generalized anxiety disorder) 04/25/2021   Heart murmur    "years ago"   History of adenomatous polyp of colon 04/25/2021   History of endometrial cancer    Insomnia    Major depressive disorder 04/25/2021   Neoplasm of uncertain behavior of skin    Obesity 10/14/2018   Peripheral neuropathy due to chemotherapy 11/20/2018   Personal history of malignant neoplasm of other parts of uterus 04/25/2021   Pneumonia    "long time ago"   Primary localized osteoarthritis of right knee 07/30/2016   Urge incontinence of urine 04/25/2021   Vitamin D deficiency 04/25/2021   Past Surgical History:  Procedure Laterality Date   COLONOSCOPY     EYE SURGERY Bilateral    cataract with lens   IR IMAGING GUIDED PORT INSERTION  11/06/2018   IR REMOVAL TUN ACCESS W/ PORT W/O FL MOD SED  02/13/2019   ROBOTIC ASSISTED TOTAL HYSTERECTOMY WITH BILATERAL SALPINGO OOPHERECTOMY N/A 10/14/2018   Procedure: ROBOTIC ASSISTED TOTAL HYSTERECTOMY WITH BILATERAL SALPINGO OOPHORECTOMY;  Surgeon: Adolphus Birchwood, MD;  Location: [facility]  ORS;  Service: Gynecology;  Laterality: N/A;   SENTINEL NODE BIOPSY N/A 10/14/2018   Procedure: SENTINEL LYMPH NODE BIOPSY;  Surgeon: Adolphus Birchwood, MD;  Location: [facility] ORS;  Service: Gynecology;  Laterality: N/A;   TONSILLECTOMY     TOOTH EXTRACTION Right 08/27/2017   TOTAL KNEE ARTHROPLASTY Right 07/30/2016   Procedure: TOTAL KNEE ARTHROPLASTY;  Surgeon: Salvatore Marvel, MD;  Location: [facility] OR;  Service: Orthopedics;  Laterality: Right;   VEIN LIGATION AND STRIPPING     Patient Active Problem List   Diagnosis Date Noted   Neoplasm of uncertain behavior of skin    Amnestic MCI (mild cognitive impairment with memory loss) 08/30/2021   Atherosclerosis of aorta 05/23/2021   Major depressive disorder 04/25/2021   Personal history of malignant neoplasm of other parts of uterus 04/25/2021   Urge incontinence of urine 04/25/2021   Vitamin D deficiency 04/25/2021   History of adenomatous polyp of colon 04/25/2021   Decreased estrogen level 04/25/2021   Chronic fatigue syndrome 04/25/2021   GAD (generalized anxiety disorder) 04/25/2021   Anemia due to antineoplastic chemotherapy 12/24/2018   Peripheral neuropathy due to chemotherapy 11/20/2018   Family history of breast cancer    Family history of lung cancer    Obesity 10/14/2018   Primary localized osteoarthritis of right knee 07/30/2016    PCP: Shon Hale, MD  REFERRING PROVIDER: Jorge Ny  S, FNP  REFERRING DIAG: R32 (ICD-10-CM) - Unspecified urinary incontinence  THERAPY DIAG:  Abnormal posture  Unspecified lack of coordination  Muscle weakness (generalized)  Rationale for Evaluation and Treatment: Rehabilitation  ONSET DATE: few years ago   SUBJECTIVE:                                                                                                                                                                                           SUBJECTIVE STATEMENT: Pt states she has started a new medication for leakage for  the last 1.5 weeks and has seen a great improvement. Does note that when she is able to remember recommendations with leakage with breathing mechanics and pelvic floor contractions does see a difference in leakage but sometimes forgets to do them.   Fluid intake: Yes: ~2-3 16oz bottles per day    PAIN:  Are you having pain? No   PRECAUTIONS: None  WEIGHT BEARING RESTRICTIONS: No  FALLS:  Has patient fallen in last 6 months? No  LIVING ENVIRONMENT: Lives with: lives with their family Lives in: House/apartment    OCCUPATION: retired  PLOF: Independent  PATIENT GOALS: to be more active with less leakage  PERTINENT HISTORY:  mild cognitive impairment, endometrial cancer, Urge incontinence of urine,  Sexual abuse: No  BOWEL MOVEMENT: Pain with bowel movement: No Type of bowel movement:Type (Bristol Stool Scale) 1-2, Frequency 1x/5days, and Strain Yes Fully empty rectum: Yes:   Leakage: No Pads: No Fiber supplement: No  URINATION: Pain with urination: No Fully empty bladder: Yes:   Stream: Strong and Weak Urgency:no Frequency: gets an urge 1-2x per day, goes to the bathroom about every 3 hours due to feeling wet diaper Leakage:  doesn't notice leakage with tasks and unknown when she does leak Pads: Yes: several diapers during the day   INTERCOURSE: Pain with intercourse:  not active Ability to have vaginal penetration:  Yes:   Climax: doesn't try Marinoff Scale: 0/3  PREGNANCY: Vaginal deliveries 0  C-section deliveries 0 Currently pregnant No  PROLAPSE: No    OBJECTIVE:   DIAGNOSTIC FINDINGS:    COGNITION: Overall cognitive status:  per chart review and pt endorses with memory problems, pt states she has "first stage dementia"      SENSATION: Light touch: Appears intact Proprioception: Appears intact  MUSCLE LENGTH: Bil hamstrings and adductors limited by 50%   FUNCTIONAL TESTS:  5 times sit to stand: 10.54s Single leg stance - 2s Rt 4s Lt -  heavy sway and multiple attempts    POSTURE: rounded shoulders, forward head, and posterior pelvic tilt   LUMBARAROM/PROM:  A/PROM A/PROM  eval  Flexion WFL  Extension WFL  Right lateral flexion Limited by 50%  Left lateral flexion Limited by 50%  Right rotation Limited by 25%  Left rotation Limited by 25%   (Blank rows = not tested)  LOWER EXTREMITY ROM:  WFL  LOWER EXTREMITY MMT:   Rt hip grossly 4/5, Lt hip 3+/5, knees 4/5.   PALPATION:   General  no TTP but did have tightness noted in upper quadrants of abdomen                External Perineal Exam mild dryness and redness noted. Pt reports she feels like she has urine present "all the time".                              Internal Pelvic Floor reported slight tightness at superficial right and left layers. Pt did have scant bleeding noted on glove post internal assessment. Pt denied any pain, informed of blood noted and reports she hasn't had spotting and educated to inform her doctor if this continues. Pt agreed.   Patient confirms identification and approves PT to assess internal pelvic floor and treatment Yes  PELVIC MMT:   MMT eval  Vaginal 3/5, 5reps, 7s  Internal Anal Sphincter   External Anal Sphincter   Puborectalis   Diastasis Recti   (Blank rows = not tested)        TONE: Mild decreased   PROLAPSE: Not noted in hooklying   TODAY'S TREATMENT:                                                                                                                              DATE:  11/20/22:   NMRE: all exercises cued for coordination of breathing mechanics, core activation, and pelvic floor mobility for improved strength and coordination of pelvic floor Nustep 8 mins L7 for increased activity at pelvic floor with reciprocal bil mobility and no leakage - last 2 mins with attempting intermittent pelvic floor contractions Squats 2x10 7# in both hands 2x10 dead lifts 7# in both hands 7# diagonals in standing  opp reach down and press away x10 X10 pelvic floor contractions with Sit to stand  Farmers carry 10#+8# switched halfway 1000'   PATIENT EDUCATION:  Education details: [id number] Person educated: Patient Education method: Explanation, Demonstration, Tactile cues, Verbal cues, and Handouts Education comprehension: verbalized understanding and returned demonstration  HOME EXERCISE PROGRAM: [id number]  ASSESSMENT:  CLINICAL IMPRESSION: Patient presented to clinic for treatment session. Session focused on coordination of pelvic floor with breathing and with activity to decreased leakage with stressors and urge. Pt tolerated well didn't notice any leakage today, reports she feels like her energy is getter better and more consistent workouts have bene helping with that and feels like it is helping with leakage too. Increased challenge today with all exercises standing and increasing resistance when tolerated. Pt benefits from cues with  technique and coordination throughout. Pt is limited with short term memory difficulties and benefits from review and with functional tasks for carry over for home. Pt would benefit from additional PT to further address deficits.      OBJECTIVE IMPAIRMENTS: decreased activity tolerance, decreased balance, decreased coordination, decreased endurance, decreased mobility, decreased strength, increased fascial restrictions, impaired flexibility, improper body mechanics, and postural dysfunction.   ACTIVITY LIMITATIONS: lifting, standing, squatting, stairs, continence, and locomotion level  PARTICIPATION LIMITATIONS: cleaning, interpersonal relationship, shopping, community activity, and yard work  PERSONAL FACTORS: Fitness, Time since onset of injury/illness/exacerbation, and 1 comorbidity: medical history  are also affecting patient's functional outcome.   REHAB POTENTIAL: Good  CLINICAL DECISION MAKING: Stable/uncomplicated  EVALUATION COMPLEXITY:  Low   GOALS: Goals reviewed with patient? Yes  SHORT TERM GOALS: Target date: 10/09/22  Pt to be I with HEP.  Baseline: Goal status: MET  2.  Pt to demonstrate at least 4/5 pelvic floor strength for improved pelvic stability and decreased strain at pelvic floor/ decrease leakage.  Baseline:  Goal status: on going  3.  Pt to demonstrate improved coordination of pelvic floor and breathing mechanics with body weight squat with appropriate synergistic patterns to decrease pain and leakage at least 25% of the time.    Baseline:  Goal status: MET  4.  Pt will have to use 3 diapers per day for improved skin integrity.  Baseline:  Goal status: on going 4 currently  5.  Pt to demonstrate improved standing balance with improved single leg stance time to at least 5s on either leg to decreased fall risk while ambulating to/from restroom.  Baseline:  Goal status: on going   LONG TERM GOALS: Target date: 12/11/22  Pt to be I with advanced HEP.  Baseline:  Goal status: MET  2.  Pt to demonstrate at least 4/5 pelvic floor strength and ability to hold isometric for 8s for improved pelvic stability and decreased strain at pelvic floor/ decrease leakage.  Baseline:  Goal status: on going  3.   Pt to demonstrate improved coordination of pelvic floor and breathing mechanics with body weight squat with appropriate synergistic patterns to decrease pain and leakage at least 50% of the time.   Baseline:  Goal status: on going  4.  Pt will have to use 2 diapers per day for improved skin integrity.  Baseline:  Goal status: on going  5.  Pt to demonstrate improved standing balance with improved single leg stance time to at least 8s on either leg to decreased fall risk while ambulating to/from restroom.  Baseline:  Goal status: on going  6.  Pt to demonstrate at least 5/5 bil hip strength for improved pelvic stability and functional squats without leakage.  Baseline:  Goal status: on  going  PLAN:  PT FREQUENCY: 1x/week  PT DURATION: 12 weeks  PLANNED INTERVENTIONS: Therapeutic exercises, Therapeutic activity, Neuromuscular re-education, Balance training, Gait training, Patient/Family education, Self Care, Aquatic Therapy, Spinal mobilization, Cryotherapy, Moist heat, scar mobilization, Taping, Biofeedback, and Manual therapy  PLAN FOR NEXT SESSION: core and hip strengthening, pelvic floor mobility and strengthening, bladder retraining, balance training with coordination of pelvic floor activity  Otelia Sergeant, PT, DPT 11/19/2409:43 AM

## 2022-11-27 ENCOUNTER — Ambulatory Visit: Payer: PPO | Admitting: Physical Therapy

## 2022-11-27 DIAGNOSIS — M6281 Muscle weakness (generalized): Secondary | ICD-10-CM

## 2022-11-27 DIAGNOSIS — R279 Unspecified lack of coordination: Secondary | ICD-10-CM

## 2022-11-27 DIAGNOSIS — R293 Abnormal posture: Secondary | ICD-10-CM | POA: Diagnosis not present

## 2022-11-27 NOTE — Therapy (Signed)
OUTPATIENT PHYSICAL THERAPY FEMALE PELVIC TREATMENT   Patient Name: Zoe Kim MRN: [id number] DOB:[birth year], 77 y.o., female Today's Date: 11/27/2022  END OF SESSION:  PT End of Session - 11/27/22 1103     Visit Number 8    Date for PT Re-Evaluation 12/11/22    Authorization Type [employer]    PT Start Time 1100    PT Stop Time 1140    PT Time Calculation (min) 40 min    Activity Tolerance Patient tolerated treatment well    Behavior During Therapy WFL for tasks assessed/performed               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   Anemia due to antineoplastic chemotherapy 12/24/2018   Arthritis    Atherosclerosis of aorta 05/23/2021   Chronic fatigue syndrome 04/25/2021   Decreased estrogen level 04/25/2021   Family history of breast cancer    Family history of lung cancer    GAD (generalized anxiety disorder) 04/25/2021   Heart murmur    "years ago"   History of adenomatous polyp of colon 04/25/2021   History of endometrial cancer    Insomnia    Major depressive disorder 04/25/2021   Neoplasm of uncertain behavior of skin    Obesity 10/14/2018   Peripheral neuropathy due to chemotherapy 11/20/2018   Personal history of malignant neoplasm of other parts of uterus 04/25/2021   Pneumonia    "long time ago"   Primary localized osteoarthritis of right knee 07/30/2016   Urge incontinence of urine 04/25/2021   Vitamin D deficiency 04/25/2021   Past Surgical History:  Procedure Laterality Date   COLONOSCOPY     EYE SURGERY Bilateral    cataract with lens   IR IMAGING GUIDED PORT INSERTION  11/06/2018   IR REMOVAL TUN ACCESS W/ PORT W/O FL MOD SED  02/13/2019   ROBOTIC ASSISTED TOTAL HYSTERECTOMY WITH BILATERAL SALPINGO OOPHERECTOMY N/A 10/14/2018   Procedure: ROBOTIC ASSISTED TOTAL HYSTERECTOMY WITH BILATERAL SALPINGO OOPHORECTOMY;  Surgeon: Adolphus Birchwood, MD;  Location: [facility]  ORS;  Service: Gynecology;  Laterality: N/A;   SENTINEL NODE BIOPSY N/A 10/14/2018   Procedure: SENTINEL LYMPH NODE BIOPSY;  Surgeon: Adolphus Birchwood, MD;  Location: [facility];  Service: Gynecology;  Laterality: N/A;   TONSILLECTOMY     TOOTH EXTRACTION Right 08/27/2017   TOTAL KNEE ARTHROPLASTY Right 07/30/2016   Procedure: TOTAL KNEE ARTHROPLASTY;  Surgeon: Salvatore Marvel, MD;  Location: [facility];  Service: Orthopedics;  Laterality: Right;   VEIN LIGATION AND STRIPPING     Patient Active Problem List   Diagnosis Date Noted   Neoplasm of uncertain behavior of skin    Amnestic MCI (mild cognitive impairment with memory loss) 08/30/2021   Atherosclerosis of aorta 05/23/2021   Major depressive disorder 04/25/2021   Personal history of malignant neoplasm of other parts of uterus 04/25/2021   Urge incontinence of urine 04/25/2021   Vitamin D deficiency 04/25/2021   History of adenomatous polyp of colon 04/25/2021   Decreased estrogen level 04/25/2021   Chronic fatigue syndrome 04/25/2021   GAD (generalized anxiety disorder) 04/25/2021   Anemia due to antineoplastic chemotherapy 12/24/2018   Peripheral neuropathy due to chemotherapy 11/20/2018   Family history of breast cancer    Family history of lung cancer    Obesity 10/14/2018   Primary localized osteoarthritis of right knee 07/30/2016    PCP: Shon Hale, MD  REFERRING PROVIDER: Jorge Ny  S, FNP  REFERRING DIAG: R32 (ICD-10-CM) - Unspecified urinary incontinence  THERAPY DIAG:  Abnormal posture  Unspecified lack of coordination  Muscle weakness (generalized)  Rationale for Evaluation and Treatment: Rehabilitation  ONSET DATE: few years ago   SUBJECTIVE:                                                                                                                                                                                           SUBJECTIVE STATEMENT: Pt reports she has been nervous about her legs the last  couple days. Reports she was having numbness intermittently before but started getting a little worse around 13-14th and didn't bother her too bad but over the past 2 days has been much worse. Bil legs up to thighs yesterday were numb and felt like she couldn't walk well outside and balance was off. Today reports strong tingling, no numbness to mid shins and some tightness. Hasn't had noted improvement with mobility, or rest, elevation. Also nothing makes them worse specifically, sometimes worse just later in the day.   Leakage - "I take something that I feel like makes me not go to the bathroom for several hours. Then I leakage a lot after this, go to the bathroom regularly after this but also still leak in between."  Fluid intake: Yes: ~2-3 16oz bottles per day    PAIN:  Are you having pain? No   PRECAUTIONS: None  WEIGHT BEARING RESTRICTIONS: No  FALLS:  Has patient fallen in last 6 months? No  LIVING ENVIRONMENT: Lives with: lives with their family Lives in: House/apartment   OCCUPATION: retired  PLOF: Independent  PATIENT GOALS: to be more active with less leakage  PERTINENT HISTORY:  mild cognitive impairment, endometrial cancer, Urge incontinence of urine,  Sexual abuse: No  BOWEL MOVEMENT: Pain with bowel movement: No Type of bowel movement:Type (Bristol Stool Scale) 1-2, Frequency 1x/5days, and Strain Yes Fully empty rectum: Yes:   Leakage: No Pads: No Fiber supplement: No  URINATION: Pain with urination: No Fully empty bladder: Yes:   Stream: Strong and Weak Urgency:no Frequency: gets an urge 1-2x per day, goes to the bathroom about every 3 hours due to feeling wet diaper Leakage:  doesn't notice leakage with tasks and unknown when she does leak Pads: Yes: several diapers during the day   INTERCOURSE: Pain with intercourse:  not active Ability to have vaginal penetration:  Yes:   Climax: doesn't try Marinoff Scale: 0/3  PREGNANCY: Vaginal deliveries  0  C-section deliveries 0 Currently pregnant No  PROLAPSE: No    OBJECTIVE:   DIAGNOSTIC FINDINGS:    COGNITION: Overall cognitive  status:  per chart review and pt endorses with memory problems, pt states she has "first stage dementia"      SENSATION: Light touch: Appears intact Proprioception: Appears intact  MUSCLE LENGTH: Bil hamstrings and adductors limited by 50%   FUNCTIONAL TESTS:  5 times sit to stand: 10.54s Single leg stance - 2s Rt 4s Lt - heavy sway and multiple attempts    POSTURE: rounded shoulders, forward head, and posterior pelvic tilt   LUMBARAROM/PROM:  A/PROM A/PROM  eval  Flexion WFL  Extension WFL  Right lateral flexion Limited by 50%  Left lateral flexion Limited by 50%  Right rotation Limited by 25%  Left rotation Limited by 25%   (Blank rows = not tested)  LOWER EXTREMITY ROM:  WFL  LOWER EXTREMITY MMT:   Rt hip grossly 4/5, Lt hip 3+/5, knees 4/5.   PALPATION:   General  no TTP but did have tightness noted in upper quadrants of abdomen                External Perineal Exam mild dryness and redness noted. Pt reports she feels like she has urine present "all the time".                              Internal Pelvic Floor reported slight tightness at superficial right and left layers. Pt did have scant bleeding noted on glove post internal assessment. Pt denied any pain, informed of blood noted and reports she hasn't had spotting and educated to inform her doctor if this continues. Pt agreed.   Patient confirms identification and approves PT to assess internal pelvic floor and treatment Yes  PELVIC MMT:   MMT eval  Vaginal 3/5, 5reps, 7s  Internal Anal Sphincter   External Anal Sphincter   Puborectalis   Diastasis Recti   (Blank rows = not tested)        TONE: Mild decreased   PROLAPSE: Not noted in hooklying   TODAY'S TREATMENT:                                                                                                                               DATE:  11/20/22:   NMRE: all exercises cued for coordination of breathing mechanics, core activation, and pelvic floor mobility for improved strength and coordination of pelvic floor Nustep 8 mins L7 for increased activity at pelvic floor with reciprocal bil mobility and no leakage - last 2 mins with attempting intermittent pelvic floor contractions Squats 2x10 7# in both hands 2x10 dead lifts 7# in both hands 7# diagonals in standing opp reach down and press away x10 X10 pelvic floor contractions with Sit to stand  Farmers carry 10#+8# switched halfway 1000'  11/27/22:   NMRE: all exercises cued for coordination of breathing mechanics, core activation, and pelvic floor mobility for improved strength and coordination of pelvic floor 2x10 pelvic floor contractions  in sitting with towel roll for improved coordination of pelvic floor and improved awareness with isolated movement all with exhale with contraction X10 10s isometrics on towel roll 3x10 quick flicks in sitting with towel roll  2x10 standing with pelvic floor contractions 2x10 5# seated chest press  2x10 5# OHP seated 2x10 red band seated bil shoulder horizontal abduction   Pt also educated to continue to attempt bladder voiding every 3 hours to decrease risk of overflow leakage as she reports she doesn't have urges to urinate often.   PATIENT EDUCATION:  Education details: [id number] Person educated: Patient Education method: Explanation, Demonstration, Tactile cues, Verbal cues, and Handouts Education comprehension: verbalized understanding and returned demonstration  HOME EXERCISE PROGRAM: [id number]  ASSESSMENT:  CLINICAL IMPRESSION: Patient presented to clinic for treatment session. Session focused on coordination of pelvic floor with breathing and with activity to decreased leakage with stressors and urge.exercises in sitting today as pt reported standing was uncomfortable due to  tingling in bil legs but more tolerable in sitting. Pt benefited from extra time and reps with pelvic floor isolated mobility then combining with activity for improved coordination. Pt tolerated well in sitting, denied leakage during session. Pt reports she will talk to provider about tingling today. She does report she had forgotten about attempting to do timed voiding for bladder voids and will try this more, also will attempt knack when needed also. Pt would benefit from additional PT to further address deficits.      OBJECTIVE IMPAIRMENTS: decreased activity tolerance, decreased balance, decreased coordination, decreased endurance, decreased mobility, decreased strength, increased fascial restrictions, impaired flexibility, improper body mechanics, and postural dysfunction.   ACTIVITY LIMITATIONS: lifting, standing, squatting, stairs, continence, and locomotion level  PARTICIPATION LIMITATIONS: cleaning, interpersonal relationship, shopping, community activity, and yard work  PERSONAL FACTORS: Fitness, Time since onset of injury/illness/exacerbation, and 1 comorbidity: medical history  are also affecting patient's functional outcome.   REHAB POTENTIAL: Good  CLINICAL DECISION MAKING: Stable/uncomplicated  EVALUATION COMPLEXITY: Low   GOALS: Goals reviewed with patient? Yes  SHORT TERM GOALS: Target date: 10/09/22  Pt to be I with HEP.  Baseline: Goal status: MET  2.  Pt to demonstrate at least 4/5 pelvic floor strength for improved pelvic stability and decreased strain at pelvic floor/ decrease leakage.  Baseline:  Goal status: on going  3.  Pt to demonstrate improved coordination of pelvic floor and breathing mechanics with body weight squat with appropriate synergistic patterns to decrease pain and leakage at least 25% of the time.    Baseline:  Goal status: MET  4.  Pt will have to use 3 diapers per day for improved skin integrity.  Baseline:  Goal status: on going 4  currently  5.  Pt to demonstrate improved standing balance with improved single leg stance time to at least 5s on either leg to decreased fall risk while ambulating to/from restroom.  Baseline:  Goal status: on going   LONG TERM GOALS: Target date: 12/11/22  Pt to be I with advanced HEP.  Baseline:  Goal status: MET  2.  Pt to demonstrate at least 4/5 pelvic floor strength and ability to hold isometric for 8s for improved pelvic stability and decreased strain at pelvic floor/ decrease leakage.  Baseline:  Goal status: on going  3.   Pt to demonstrate improved coordination of pelvic floor and breathing mechanics with body weight squat with appropriate synergistic patterns to decrease pain and leakage at least 50% of the time.   Baseline:  Goal status: on going  4.  Pt will have to use 2 diapers per day for improved skin integrity.  Baseline:  Goal status: on going  5.  Pt to demonstrate improved standing balance with improved single leg stance time to at least 8s on either leg to decreased fall risk while ambulating to/from restroom.  Baseline:  Goal status: on going  6.  Pt to demonstrate at least 5/5 bil hip strength for improved pelvic stability and functional squats without leakage.  Baseline:  Goal status: on going  PLAN:  PT FREQUENCY: 1x/week  PT DURATION: 12 weeks  PLANNED INTERVENTIONS: Therapeutic exercises, Therapeutic activity, Neuromuscular re-education, Balance training, Gait training, Patient/Family education, Self Care, Aquatic Therapy, Spinal mobilization, Cryotherapy, Moist heat, scar mobilization, Taping, Biofeedback, and Manual therapy  PLAN FOR NEXT SESSION: core and hip strengthening, pelvic floor mobility and strengthening, bladder retraining, balance training with coordination of pelvic floor activity  Otelia Sergeant, PT, DPT 11/27/2410:32 PM

## 2022-12-04 ENCOUNTER — Ambulatory Visit: Payer: PPO | Attending: Family Medicine | Admitting: Physical Therapy

## 2022-12-04 DIAGNOSIS — M6281 Muscle weakness (generalized): Secondary | ICD-10-CM | POA: Insufficient documentation

## 2022-12-04 DIAGNOSIS — R279 Unspecified lack of coordination: Secondary | ICD-10-CM | POA: Insufficient documentation

## 2022-12-04 DIAGNOSIS — R293 Abnormal posture: Secondary | ICD-10-CM | POA: Insufficient documentation

## 2022-12-18 DIAGNOSIS — R000 Tachycardia, unspecified: Secondary | ICD-10-CM | POA: Diagnosis not present

## 2022-12-18 DIAGNOSIS — R634 Abnormal weight loss: Secondary | ICD-10-CM | POA: Diagnosis not present

## 2022-12-18 DIAGNOSIS — D649 Anemia, unspecified: Secondary | ICD-10-CM | POA: Diagnosis not present

## 2022-12-18 DIAGNOSIS — R059 Cough, unspecified: Secondary | ICD-10-CM | POA: Diagnosis not present

## 2022-12-27 DIAGNOSIS — R8290 Unspecified abnormal findings in urine: Secondary | ICD-10-CM | POA: Diagnosis not present

## 2022-12-27 DIAGNOSIS — R053 Chronic cough: Secondary | ICD-10-CM | POA: Diagnosis not present

## 2022-12-27 DIAGNOSIS — R413 Other amnesia: Secondary | ICD-10-CM | POA: Diagnosis not present

## 2022-12-27 DIAGNOSIS — H532 Diplopia: Secondary | ICD-10-CM | POA: Diagnosis not present

## 2022-12-27 DIAGNOSIS — Z23 Encounter for immunization: Secondary | ICD-10-CM | POA: Diagnosis not present

## 2022-12-27 DIAGNOSIS — F0390 Unspecified dementia without behavioral disturbance: Secondary | ICD-10-CM | POA: Diagnosis not present

## 2022-12-27 DIAGNOSIS — R2689 Other abnormalities of gait and mobility: Secondary | ICD-10-CM | POA: Diagnosis not present

## 2022-12-27 DIAGNOSIS — R4589 Other symptoms and signs involving emotional state: Secondary | ICD-10-CM | POA: Diagnosis not present

## 2022-12-27 DIAGNOSIS — F419 Anxiety disorder, unspecified: Secondary | ICD-10-CM | POA: Diagnosis not present

## 2022-12-27 DIAGNOSIS — E871 Hypo-osmolality and hyponatremia: Secondary | ICD-10-CM | POA: Diagnosis not present

## 2022-12-27 DIAGNOSIS — R531 Weakness: Secondary | ICD-10-CM | POA: Diagnosis not present

## 2022-12-31 ENCOUNTER — Other Ambulatory Visit: Payer: Self-pay | Admitting: Family Medicine

## 2022-12-31 DIAGNOSIS — R413 Other amnesia: Secondary | ICD-10-CM

## 2023-01-02 ENCOUNTER — Encounter: Payer: Self-pay | Admitting: Physician Assistant

## 2023-01-07 DIAGNOSIS — N390 Urinary tract infection, site not specified: Secondary | ICD-10-CM | POA: Diagnosis not present

## 2023-01-07 DIAGNOSIS — R945 Abnormal results of liver function studies: Secondary | ICD-10-CM | POA: Diagnosis not present

## 2023-01-07 DIAGNOSIS — E871 Hypo-osmolality and hyponatremia: Secondary | ICD-10-CM | POA: Diagnosis not present

## 2023-01-07 DIAGNOSIS — Z03818 Encounter for observation for suspected exposure to other biological agents ruled out: Secondary | ICD-10-CM | POA: Diagnosis not present

## 2023-01-07 DIAGNOSIS — R058 Other specified cough: Secondary | ICD-10-CM | POA: Diagnosis not present

## 2023-01-07 DIAGNOSIS — J40 Bronchitis, not specified as acute or chronic: Secondary | ICD-10-CM | POA: Diagnosis not present

## 2023-01-07 DIAGNOSIS — J069 Acute upper respiratory infection, unspecified: Secondary | ICD-10-CM | POA: Diagnosis not present

## 2023-01-10 ENCOUNTER — Ambulatory Visit
Admission: RE | Admit: 2023-01-10 | Discharge: 2023-01-10 | Disposition: A | Discharge status: Home / Self Care | Payer: PPO | Source: Ambulatory Visit | Attending: Family Medicine | Admitting: Family Medicine

## 2023-01-10 DIAGNOSIS — R413 Other amnesia: Secondary | ICD-10-CM | POA: Diagnosis not present

## 2023-01-10 MED ORDER — GADOPICLENOL 0.5 MMOL/ML IV SOLN
8.0000 mL | Freq: Once | INTRAVENOUS | Status: AC | PRN
  Administered 2023-01-10: 8 mL via INTRAVENOUS

## 2023-01-17 ENCOUNTER — Ambulatory Visit: Payer: PPO | Admitting: Physician Assistant

## 2023-01-21 DIAGNOSIS — D649 Anemia, unspecified: Secondary | ICD-10-CM | POA: Diagnosis not present

## 2023-01-21 DIAGNOSIS — D75839 Thrombocytosis, unspecified: Secondary | ICD-10-CM | POA: Diagnosis not present

## 2023-01-21 DIAGNOSIS — R634 Abnormal weight loss: Secondary | ICD-10-CM | POA: Diagnosis not present

## 2023-01-21 DIAGNOSIS — N1831 Chronic kidney disease, stage 3a: Secondary | ICD-10-CM | POA: Diagnosis not present

## 2023-01-21 DIAGNOSIS — Z87440 Personal history of urinary (tract) infections: Secondary | ICD-10-CM | POA: Diagnosis not present

## 2023-01-21 DIAGNOSIS — Z9989 Dependence on other enabling machines and devices: Secondary | ICD-10-CM | POA: Diagnosis not present

## 2023-01-21 DIAGNOSIS — R296 Repeated falls: Secondary | ICD-10-CM | POA: Diagnosis not present

## 2023-01-21 DIAGNOSIS — R058 Other specified cough: Secondary | ICD-10-CM | POA: Diagnosis not present

## 2023-01-23 ENCOUNTER — Other Ambulatory Visit: Payer: Self-pay | Admitting: Family Medicine

## 2023-01-23 DIAGNOSIS — R634 Abnormal weight loss: Secondary | ICD-10-CM

## 2023-01-24 ENCOUNTER — Encounter: Payer: Self-pay | Admitting: Physician Assistant

## 2023-01-24 ENCOUNTER — Ambulatory Visit: Payer: PPO | Admitting: Physician Assistant

## 2023-01-24 VITALS — BP 124/76 | HR 67 | Resp 20 | Wt 170.0 lb

## 2023-01-24 DIAGNOSIS — G3184 Mild cognitive impairment, so stated: Secondary | ICD-10-CM

## 2023-01-24 DIAGNOSIS — R413 Other amnesia: Secondary | ICD-10-CM

## 2023-01-24 DIAGNOSIS — F4321 Adjustment disorder with depressed mood: Secondary | ICD-10-CM | POA: Diagnosis not present

## 2023-01-24 MED ORDER — DONEPEZIL HCL 10 MG PO TABS: ORAL_TABLET | ORAL | 11 refills | Status: DC

## 2023-01-24 NOTE — Patient Instructions (Addendum)
It was a pleasure to see you today at our office.   Recommendations:  Follow up in 6  months   Increase donepezil 10  mg daily  Continue B12  Recommend psychotherapy  Repeat neuropsych evaluation next year   Whom to call:  Memory  decline, memory medications: Call our office [phone]   For psychiatric meds, mood meds: Please have your primary care physician manage these medications.   Counseling regarding caregiver distress, including caregiver depression, anxiety and issues regarding community resources, adult day care programs, adult living facilities, or memory care questions:   Feel free to contact Misty Lisabeth Register, Social Worker at [phone]   For assessment of decision of mental capacity and competency:  Call Dr. Erick Blinks, geriatric psychiatrist at [phone]  For guidance in geriatric dementia issues please call [facility] [phone]     If you have any severe symptoms of a stroke, or other severe issues such as confusion,severe chills or fever, etc call 911 or go to the ER as you may need to be evaluated further        RECOMMENDATIONS FOR ALL PATIENTS WITH MEMORY PROBLEMS: 1. Continue to exercise (Recommend 30 minutes of walking everyday, or 3 hours every week) 2. Increase social interactions - continue going to [place] and enjoy social gatherings with friends and family 3. Eat healthy, avoid fried foods and eat more fruits and vegetables 4. Maintain adequate blood pressure, blood sugar, and blood cholesterol level. Reducing the risk of stroke and cardiovascular disease also helps promoting better memory. 5. Avoid stressful situations. Live a simple life and avoid aggravations. Organize your time and prepare for the next day in anticipation. 6. Sleep well, avoid any interruptions of sleep and avoid any distractions in the bedroom that may interfere with adequate sleep quality 7. Avoid sugar, avoid sweets as there is a strong link between  excessive sugar intake, diabetes, and cognitive impairment We discussed the Mediterranean diet, which has been shown to help patients reduce the risk of progressive memory disorders and reduces cardiovascular risk. This includes eating fish, eat fruits and green leafy vegetables, nuts like almonds and hazelnuts, walnuts, and also use olive oil. Avoid fast foods and fried foods as much as possible. Avoid sweets and sugar as sugar use has been linked to worsening of memory function.  There is always a concern of gradual progression of memory problems. If this is the case, then we may need to adjust level of care according to patient needs. Support, both to the patient and caregiver, should then be put into place.    FALL PRECAUTIONS: Be cautious when walking. Scan the area for obstacles that may increase the risk of trips and falls. When getting up in the mornings, sit up at the edge of the bed for a few minutes before getting out of bed. Consider elevating the bed at the head end to avoid drop of blood pressure when getting up. Walk always in a well-lit room (use night lights in the walls). Avoid area rugs or power cords from appliances in the middle of the walkways. Use a walker or a cane if necessary and consider physical therapy for balance exercise. Get your eyesight checked regularly.  FINANCIAL OVERSIGHT: Supervision, especially oversight when making financial decisions or transactions is also recommended.  HOME SAFETY: Consider the safety of the kitchen when operating appliances like stoves, microwave oven, and blender. Consider having supervision and share cooking responsibilities until no longer able to participate in those. Accidents  with firearms and other hazards in the house should be identified and addressed as well.   ABILITY TO BE LEFT ALONE: If patient is unable to contact 911 operator, consider using [employer], or when the need is there, arrange for someone to stay with patients. Smoking  is a fire hazard, consider supervision or cessation. Risk of wandering should be assessed by caregiver and if detected at any point, supervision and safe proof recommendations should be instituted.  MEDICATION SUPERVISION: Inability to self-administer medication needs to be constantly addressed. Implement a mechanism to ensure safe administration of the medications.   DRIVING: Regarding driving, in patients with progressive memory problems, driving will be impaired. We advise to have someone else do the driving if trouble finding directions or if minor accidents are reported. Independent driving assessment is available to determine safety of driving.   If you are interested in the driving assessment, you can contact the following:  [employer] in [place] [phone]  [facility] [phone]  [facility] [phone]  [facility] [phone] or [phone]

## 2023-01-24 NOTE — Progress Notes (Signed)
Assessment/Plan:   Amnestic MCI, concern for Alzheimer's disease  Zoe Kim is a very pleasant 77 y.o. 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  MRI of the brain 01/17/2023, personally reviewed is without intracranial abnormalities, no age advanced or lobar predominant parenchymal atrophy, but  remarkable for chronic moderate small vessel ischemic changes within the cerebral white matter and pons .Patient is on donepezil 5 mg daily.  MMSE today is 23/30, slightly worse than prior.  It is unclear if situational depression may contribute to her memory issues.  She is interested in proceeding with psychotherapy     Recommendations:   Follow up in 6  months. Continue donepezil, increase it to 10 mg daily Repeat neuropsych evaluation for clarity of the diagnosis and disease trajectory Continue B12 supplements Recommend good control of cardiovascular risk factors Continue to control mood as per PCP Referral to psychotherapy for situational depression and anxiety    Subjective:   This patient is accompanied in the office by her husband who supplements the history. Previous records as well as any outside records available were reviewed prior to todays visit.   Patient was last seen on 07/19/2022     Any changes in memory since last visit?  "Short-term is worse than her memory, long term memory may be affected too "-husband says.  She has not been doing crossword puzzles, computer  She watches TV Westerns, such as Lawson Fiscal and [employer]. She is less active with her horses, concerned that she has to put down one of her horses. repeats oneself?  Endorsed, constant according to her husband Disoriented when  walking into a room?  Patient denies   Leaving objects in unusual places?  Patient denies   Wandering behavior?   Denies.   Any personality changes since last visit? She may become irritable at times Any worsening depression?:  Endorsed , interested in psychotherapy .  Hallucinations or paranoia?  denies   Seizures?   denies    Any sleep changes? Sleeps well, with vivid dreams "usually about ballet" . denies REM behavior or sleepwalking . Sleeps on her lounge chair and does not feel like going back to the bed "I am lazy to go back to the bed ".  Sleep apnea?   denies   Any hygiene concerns?  "I don't groom myself anymore".   Independent of bathing and dressing?  Endorsed  Does the patient needs help with medications? Patient is in charge, husband checks behind Who is in charge of the finances?  Patient and husband in charge     Any changes in appetite?  Decreased. "She has a mistery illness, has decreased appetite since then"this is being followed up by PCP, she has an appointment for CT of the chest, she has been experiencing chronic cough. Patient have trouble swallowing?  denies   Does the patient cook?  No  Any kitchen accidents such as leaving the stove on? denies   Any headaches?    denies   Vision changes? denies Chronic pain?  denies   Ambulates with difficulty?  She has a history of postsurgical neuropathy, chronic, which may affect her mobility.  She does PT for strength and balance on a regular basis   Recent falls or head injuries?  denies  Unilateral weakness, numbness or tingling?   denies   Any tremors?  denies   Any anosmia?    denies   Any incontinence of urine?  History of urge incontinence, does Kegel exercises, follows with urology.  She uses [employer] Any bowel dysfunction?  denies      Patient lives with husband    Does the patient drive?  Has not been driving for the last 2 months "not feeling comfortable driving".      Initial visit 08/30/21 The patient is  seen in neurologic consultation at the request of Shon Hale, * for the evaluation of memory.  The patient is accompanied by her husband who supplements the history.This is a 77 y.o. year old RH  female who has had memory issues for about 1 year, worse over the last 6 months, when she began to notice that she was forgetting day-to-day chores, for example when to feed her horses, or keep appointments.  She describes it as a "vague feeling, I do not know how to describe it ".  She does repeat herself and asked the same questions.  She denies being disoriented when walking into her room or leaving objects in unusual places.  She has a history of chemo-induced neuropathy, which at times gives her difficulty to ambulate due to numbness in both feet, but denies any unilateral weakness, or tingling.  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  She denies any wandering behavior.  She continues to drive without getting lost.  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  She feels that she has no direction, and other than feeding the dogs and the horses, and attending exercise classes twice a week, and ballet bar 3 times a week, and riding her horses she does not participate in other activities to keep her busy.  She denies irritability.  She never sought counseling.  She enjoys doing crossword puzzles, word finding, board games, painting as well.  She used to sleep less hours, but she began taking melatonin, and since her husband snores, there are no longer sleeping in the same bedroom, which helps, and now the sleep longer amount of time.  Her  sleeping patterns however are irregular.  She denies any vivid dreams or sleepwalking.  She may talk in her sleep at times according to her husband.  She denies any hallucinations or paranoia.  There are no hygiene concerns, although she is not interested in bathing since she has nowhere to go, she now takes a shower every 3 days.  She places the medications in a pillbox, she does not forget to take them.  Her husband and her are in charge of the finances.  Her appetite has been increased, denies trouble swallowing.  She does not cook.  She denies any headaches.  She has issues with her vision, stating that when turning her eyes to the left or to the right, she may notice less acuity, or double vision at times.  She denies any dizziness.  She denies any tremors.  For the last 2 years, she has anosmia.  No history of seizures.  She has history of urge  incontinence, denies constipation or diarrhea.  Denies alcohol or tobacco use.  Family history negative for Alzheimer's disease or any other type of dementia.  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       Neuropsychological evaluation 07/02/2022 briefly, results suggested a primary impairment surrounding essentially all aspects of learning and memory. An additional weakness was exhibited across semantic fluency. Performances were otherwise appropriate relative to age-matched peers. The etiology for ongoing memory impairment is unclear. With that being said, concerns for an underlying neurodegenerative illness, namely Alzheimer's disease, remain reasonable. Across memory testing, Ms. Sebastiani did not show notable benefit from the repetition of information across learning trials, was fully amnestic (i.e, 0% retention) across 2/3 memory tasks, and generally performed poorly across recognition trials. Taken together, this suggests concern for rapid forgetting and an evolving storage  impairment, both of which are the hallmark characteristics of this illness. Her additional weakness in semantic fluency would follow typical disease trajectory, adding to ongoing concern. Confrontation naming and visuospatial abilities were appropriate, which could suggest this illness being in early stages if truly present. Continued medical monitoring will be important moving forward.      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   Anemia due to antineoplastic chemotherapy 12/24/2018   Arthritis    Atherosclerosis of aorta 05/23/2021   Chronic fatigue syndrome 04/25/2021   Decreased estrogen level 04/25/2021   Family history of breast cancer    Family history of lung cancer    GAD (generalized anxiety disorder) 04/25/2021   Heart murmur    "years ago"   History of adenomatous polyp of colon 04/25/2021   History of endometrial cancer    Insomnia    Major depressive disorder 04/25/2021   Neoplasm of uncertain behavior of skin    Obesity 10/14/2018   Peripheral neuropathy due to chemotherapy 11/20/2018   Personal history of malignant neoplasm of other parts of uterus 04/25/2021   Pneumonia    "long time ago"   Primary localized osteoarthritis of right knee 07/30/2016   Urge incontinence of urine 04/25/2021   Vitamin D deficiency 04/25/2021     Past Surgical History:  Procedure Laterality Date   COLONOSCOPY     EYE SURGERY Bilateral    cataract with lens   IR IMAGING GUIDED PORT INSERTION  11/06/2018   IR REMOVAL TUN ACCESS W/ PORT W/O FL MOD SED  02/13/2019   ROBOTIC ASSISTED TOTAL HYSTERECTOMY WITH BILATERAL SALPINGO OOPHERECTOMY N/A 10/14/2018   Procedure: ROBOTIC ASSISTED TOTAL HYSTERECTOMY WITH BILATERAL SALPINGO OOPHORECTOMY;  Surgeon: Adolphus Birchwood, MD;  Location: [facility];  Service: Gynecology;  Laterality: N/A;   SENTINEL NODE BIOPSY N/A 10/14/2018   Procedure: SENTINEL LYMPH NODE BIOPSY;  Surgeon: Adolphus Birchwood,  MD;  Location: [facility];  Service: Gynecology;  Laterality: N/A;   TONSILLECTOMY     TOOTH EXTRACTION Right 08/27/2017   TOTAL KNEE ARTHROPLASTY Right 07/30/2016   Procedure: TOTAL KNEE ARTHROPLASTY;  Surgeon: Salvatore Marvel, MD;  Location[facility];  Service: Orthopedics;  Laterality: Right;   VEIN LIGATION AND STRIPPING       PREVIOUS MEDICATIONS:   CURRENT MEDICATIONS:  Outpatient Encounter Medications as of 01/24/2023  Medication Sig   buPROPion (WELLBUTRIN XL) 150 MG 24 hr tablet Take 150 mg by mouth every morning.   buPROPion (WELLBUTRIN XL) 300 MG 24 hr tablet 1 tablet in  the morning Orally Once a day for 90 days   cyanocobalamin (VITAMIN B12) 500 MCG tablet 1,000 mcg.   desvenlafaxine (PRISTIQ) 50 MG 24 hr tablet Take 1 tablet (50 mg total) by mouth daily.   gabapentin (NEURONTIN) 400 MG capsule Take by mouth.   [DISCONTINUED] donepezil (ARICEPT) 5 MG tablet TAKE 1/2 TABLET(2.5 MG) BY MOUTH DAILY FOR 2 WEEKS INCREASE TO 1 TABLET DAILY   donepezil (ARICEPT) 10 MG tablet TAKE   1 TABLET DAILY   oxybutynin (DITROPAN XL) 15 MG 24 hr tablet Take 15 mg by mouth at bedtime. (Patient not taking: Reported on 01/24/2023)   No facility-administered encounter medications on file as of 01/24/2023.     Objective:     PHYSICAL EXAMINATION:    VITALS:   Vitals:   01/24/23 0940  BP: 124/76  Pulse: 67  Resp: 20  SpO2: 99%  Weight: 170 lb (77.1 kg)    GEN:  The patient appears stated age and is in NAD. HEENT:  Normocephalic, atraumatic.   Neurological examination:  General: NAD, well-groomed, appears stated age. Orientation: The patient is alert. Oriented to person, place and not to date.  Flat affect. Cranial nerves: There is good facial symmetry.The speech is fluent and clear. No aphasia or dysarthria. Fund of knowledge is appropriate. Recent memory impaired and remote memory is normal.  Attention and concentration are normal.  Able to name objects and repeat phrases.  Hearing is intact  to conversational tone.   Delayed recall 1/3 Sensation: Sensation is intact to light touch throughout Motor: Strength is at least antigravity x4. DTR's 2/4 in UE/LE      08/30/2021    8:00 AM  Montreal Cognitive Assessment   Visuospatial/ Executive (0/5) 3  Naming (0/3) 3  Attention: Read list of digits (0/2) 2  Attention: Read list of letters (0/1) 1  Attention: Serial 7 subtraction starting at 100 (0/3) 3  Language: Repeat phrase (0/2) 1  Language : Fluency (0/1) 0  Abstraction (0/2) 2  Delayed Recall (0/5) 0  Orientation (0/6) 6  Total 21  Adjusted Score (based on education) 21       01/24/2023   12:00 PM  MMSE - Mini Mental State Exam  Orientation to time 3  Orientation to Place 2  Registration 3  Attention/ Calculation 5  Recall 1  Language- name 2 objects 2  Language- repeat 1  Language- follow 3 step command 3  Language- read & follow direction 1  Write a sentence 1  Copy design 1  Total score 23       Movement examination: Tone: There is normal tone in the UE/LE Abnormal movements:  no tremor.  No myoclonus.  No asterixis.   Coordination:  There is no decremation with RAM's. Normal finger to nose  Gait and Station: The patient has no difficulty arising out of a deep-seated chair without the use of the hands. The patient's stride length is good.  Gait is cautious and narrow.   Thank you for allowing [place] the opportunity to participate in the care of this nice patient. Please do not hesitate to contact us for any questions or concerns.   Total time spent on today's visit was 32 minutes dedicated to this patient today, preparing to see patient, examining the patient, ordering tests and/or medications and counseling the patient, documenting clinical information in the EHR or other health record, independently interpreting results and communicating results to the patient/family, discussing treatment and goals, answering patient's questions and  coordinating care.  Cc:   Shon Hale, MD  Marlowe Kays 01/24/2023 12:22 PM

## 2023-01-29 ENCOUNTER — Encounter (HOSPITAL_COMMUNITY): Payer: Self-pay

## 2023-01-29 ENCOUNTER — Emergency Department (HOSPITAL_COMMUNITY): Payer: PPO

## 2023-01-29 ENCOUNTER — Telehealth: Payer: Self-pay | Admitting: Critical Care Medicine

## 2023-01-29 ENCOUNTER — Emergency Department (HOSPITAL_COMMUNITY)
Admission: EM | Admit: 2023-01-29 | Discharge: 2023-01-29 | Disposition: A | Discharge status: Home / Self Care | Payer: PPO | Attending: Emergency Medicine | Admitting: Emergency Medicine

## 2023-01-29 ENCOUNTER — Other Ambulatory Visit: Payer: Self-pay

## 2023-01-29 DIAGNOSIS — R918 Other nonspecific abnormal finding of lung field: Secondary | ICD-10-CM | POA: Diagnosis not present

## 2023-01-29 DIAGNOSIS — J479 Bronchiectasis, uncomplicated: Secondary | ICD-10-CM | POA: Diagnosis not present

## 2023-01-29 DIAGNOSIS — R5383 Other fatigue: Secondary | ICD-10-CM | POA: Insufficient documentation

## 2023-01-29 DIAGNOSIS — Z20822 Contact with and (suspected) exposure to covid-19: Secondary | ICD-10-CM | POA: Diagnosis not present

## 2023-01-29 DIAGNOSIS — R531 Weakness: Secondary | ICD-10-CM | POA: Insufficient documentation

## 2023-01-29 DIAGNOSIS — R051 Acute cough: Secondary | ICD-10-CM | POA: Insufficient documentation

## 2023-01-29 DIAGNOSIS — R059 Cough, unspecified: Secondary | ICD-10-CM | POA: Diagnosis not present

## 2023-01-29 DIAGNOSIS — F0390 Unspecified dementia without behavioral disturbance: Secondary | ICD-10-CM | POA: Insufficient documentation

## 2023-01-29 DIAGNOSIS — R222 Localized swelling, mass and lump, trunk: Secondary | ICD-10-CM | POA: Diagnosis not present

## 2023-01-29 DIAGNOSIS — R0602 Shortness of breath: Secondary | ICD-10-CM | POA: Diagnosis not present

## 2023-01-29 LAB — RESP PANEL BY RT-PCR (RSV, FLU A&B, COVID)  RVPGX2
Influenza A by PCR: NEGATIVE
Influenza B by PCR: NEGATIVE
Resp Syncytial Virus by PCR: NEGATIVE
SARS Coronavirus 2 by RT PCR: NEGATIVE

## 2023-01-29 LAB — BASIC METABOLIC PANEL
Anion gap: 14 (ref 5–15)
BUN: 8 mg/dL (ref 8–23)
CO2: 21 mmol/L — ABNORMAL LOW (ref 22–32)
Calcium: 8.9 mg/dL (ref 8.9–10.3)
Chloride: 93 mmol/L — ABNORMAL LOW (ref 98–111)
Creatinine, Ser: 0.94 mg/dL (ref 0.44–1.00)
GFR, Estimated: >60 mL/min (ref >60)
Glucose, Bld: 118 mg/dL — ABNORMAL HIGH (ref 70–99)
Potassium: 3.8 mmol/L (ref 3.5–5.1)
Sodium: 128 mmol/L — ABNORMAL LOW (ref 135–145)

## 2023-01-29 LAB — CBC
HCT: 32.9 % — ABNORMAL LOW (ref 36.0–46.0)
Hemoglobin: 10.7 g/dL — ABNORMAL LOW (ref 12.0–15.0)
MCH: 28.5 pg (ref 26.0–34.0)
MCHC: 32.5 g/dL (ref 30.0–36.0)
MCV: 87.7 fL (ref 80.0–100.0)
Platelets: 463 10*3/uL — ABNORMAL HIGH (ref 150–400)
RBC: 3.75 MIL/uL — ABNORMAL LOW (ref 3.87–5.11)
RDW: 14.8 % (ref 11.5–15.5)
WBC: 9.1 10*3/uL (ref 4.0–10.5)
nRBC: 0 % (ref 0.0–0.2)

## 2023-01-29 LAB — HEPATIC FUNCTION PANEL
ALT: 12 U/L (ref 0–44)
AST: 19 U/L (ref 15–41)
Albumin: 3.1 g/dL — ABNORMAL LOW (ref 3.5–5.0)
Alkaline Phosphatase: 122 U/L (ref 38–126)
Bilirubin, Direct: 0.3 mg/dL — ABNORMAL HIGH (ref 0.0–0.2)
Indirect Bilirubin: 0.1 mg/dL — ABNORMAL LOW (ref 0.3–0.9)
Total Bilirubin: 0.4 mg/dL (ref 0.3–1.2)
Total Protein: 7.3 g/dL (ref 6.5–8.1)

## 2023-01-29 LAB — LIPASE, BLOOD: Lipase: 22 U/L (ref 11–51)

## 2023-01-29 LAB — CBG MONITORING, ED: Glucose-Capillary: 120 mg/dL — ABNORMAL HIGH (ref 70–99)

## 2023-01-29 MED ORDER — IOHEXOL 300 MG/ML  SOLN
75.0000 mL | Freq: Once | INTRAMUSCULAR | Status: AC | PRN
  Administered 2023-01-29: 75 mL via INTRAVENOUS

## 2023-01-29 MED ORDER — SODIUM CHLORIDE 0.9 % IV BOLUS
1000.0000 mL | Freq: Once | INTRAVENOUS | Status: AC
  Administered 2023-01-29: 1000 mL via INTRAVENOUS

## 2023-01-29 MED ORDER — BENZONATATE 100 MG PO CAPS: 100.0000 mg | ORAL_CAPSULE | Freq: Three times a day (TID) | ORAL | 0 refills | Status: DC | PRN

## 2023-01-29 NOTE — Discharge Instructions (Signed)
Your history, exam, and workup today revealed you have an abnormal area of tissue or mass in your left lung that is likely contributing to your symptoms of shortness of breath and fatigue.  I spoke to the pulmonology/lung specialist who does want you seen in clinic to create a plan going forward.  Please expect a call over the next day or 2 to schedule this appointment.  Please rest and stay hydrated as best you can.  If any symptoms change or worsen acutely, please return to the nearest emergency department.

## 2023-01-29 NOTE — ED Provider Notes (Signed)
Care assumed from Dr. Estell Harpin.  At time of transfer of care, patient awaiting for results of CT chest to further evaluate abnormal appearing mass on chest x-ray when getting evaluation for cough and fatigue.  Per previous plan, will consult pulmonology and discuss who to follow-up with for outpatient workup.  5:14 PM Just told patient about the findings on her CT and showed her the images as well.  Awaiting call from pulmonology to discuss an outpatient plan.  Anticipate discharge home with prescription for Tessalon for the cough for outpatient management.  On reassessment her oxygen was normal and her vital signs are reassuring on my reassessment.  5:30 PM Spoke with Dr. Zoe Lan who quickly called me back and agrees that patient needs outpatient workup to determine a plan for this mass.  She will have her team call the patient in the next day or 2 and I informed the patient to expect a call.  Will print the prescription for Tessalon and patient will follow-up.  Return precautions were understood.  She had no other questions or concerns and was discharged in stable condition.  Clinical Impression: 1. Acute cough   2. Mass of left lung   3. Fatigue, unspecified type     Disposition: Discharge  Condition: Good  I have discussed the results, Dx and Tx plan with the pt(& family if present). He/she/they expressed understanding and agree(s) with the plan. Discharge instructions discussed at great length. Strict return precautions discussed and pt &/or family have verbalized understanding of the instructions. No further questions at time of discharge.    New Prescriptions   BENZONATATE (TESSALON) 100 MG CAPSULE    Take 1 capsule (100 mg total) by mouth 3 (three) times daily as needed for cough.    Follow Up: pulmonology Expect a call in the next 24 to 48 hours from the pulmonology clinic to schedule an appointment to create a plan.       Ayush Boulet, Canary Brim, MD 01/29/23 [phone]

## 2023-01-29 NOTE — ED Triage Notes (Signed)
Pt arrived POV for cough for a few months. Reports has been seen by pcp for it, no better. Also reports weakness that has been getting worse. States Md order MRI of chest and she is not sure why. Denies fever, chills,Shob, N/V or any other symptoms.

## 2023-01-29 NOTE — ED Provider Notes (Signed)
[facility] Provider Note   CSN: [id number] Arrival date & time: 01/29/23  4401     History {Add pertinent medical, surgical, social history, OB history to HPI:1} Chief Complaint  Patient presents with   Cough   Weakness    Zoe Kim is a 77 y.o. female.  Patient has a history of dementia and chronic fatigue.  She has had a cough for few weeks and has not been eating or drinking much.  The history is provided by the patient and medical records. No language interpreter was used.  Cough Cough characteristics:  Non-productive Sputum characteristics:  Nondescript Severity:  Mild Onset quality:  Gradual Timing:  Constant Chronicity:  New Smoker: no   Context: not animal exposure   Relieved by:  Nothing Associated symptoms: no chest pain, no eye discharge, no headaches and no rash   Weakness Associated symptoms: cough   Associated symptoms: no abdominal pain, no chest pain, no diarrhea, no frequency, no headaches and no seizures        Home Medications Prior to Admission medications   Medication Sig Start Date End Date Taking? Authorizing Provider  buPROPion (WELLBUTRIN XL) 150 MG 24 hr tablet Take 150 mg by mouth every morning. 10/10/22   [provider]  buPROPion (WELLBUTRIN XL) 300 MG 24 hr tablet 1 tablet in the morning Orally Once a day for 90 days 10/10/22   [provider]  cyanocobalamin (VITAMIN B12) 500 MCG tablet 1,000 mcg. 12/28/22   [provider]  desvenlafaxine (PRISTIQ) 50 MG 24 hr tablet Take 1 tablet (50 mg total) by mouth daily. 05/23/21   Sonny Masters, FNP  donepezil (ARICEPT) 10 MG tablet TAKE   1 TABLET DAILY 01/24/23   Marcos Eke, PA-C  gabapentin (NEURONTIN) 400 MG capsule Take by mouth. 04/23/22   [provider]  oxybutynin (DITROPAN XL) 15 MG 24 hr tablet Take 15 mg by mouth at bedtime. Patient not taking: Reported on 01/24/2023    [provider]      Allergies    Sulfa antibiotics and Sulfamethoxazole-trimethoprim    Review of Systems   Review of Systems  Constitutional:  Negative for appetite change and fatigue.  HENT:  Negative for congestion, ear discharge and sinus pressure.   Eyes:  Negative for discharge.  Respiratory:  Positive for cough.   Cardiovascular:  Negative for chest pain.  Gastrointestinal:  Negative for abdominal pain and diarrhea.  Genitourinary:  Negative for frequency and hematuria.  Musculoskeletal:  Negative for back pain.  Skin:  Negative for rash.  Neurological:  Positive for weakness. Negative for seizures and headaches.  Psychiatric/Behavioral:  Negative for hallucinations.     Physical Exam Updated Vital Signs BP 123/68   Pulse 82   Temp 98.2 F (36.8 C)   Resp 15   Ht 5\' 4"  (1.626 m)   Wt 77 kg   SpO2 99%   BMI 29.14 kg/m  Physical Exam Vitals and nursing note reviewed.  Constitutional:      Appearance: She is well-developed.  HENT:     Head: Normocephalic.     Mouth/Throat:     Mouth: Mucous membranes are moist.  Eyes:     General: No scleral icterus.    Conjunctiva/sclera: Conjunctivae normal.  Neck:     Thyroid: No thyromegaly.  Cardiovascular:     Rate and Rhythm: Normal rate and regular rhythm.     Heart sounds: No murmur heard.  No friction rub. No gallop.  Pulmonary:     Breath sounds: No stridor. No wheezing or rales.  Chest:     Chest wall: No tenderness.  Abdominal:     General: There is no distension.     Tenderness: There is no abdominal tenderness. There is no rebound.  Musculoskeletal:        General: Normal range of motion.     Cervical back: Neck supple.  Lymphadenopathy:     Cervical: No cervical adenopathy.  Skin:    Findings: No erythema or rash.  Neurological:     Mental Status: She is alert and oriented to person, place, and time.     Motor: No abnormal muscle tone.     Coordination: Coordination normal.  Psychiatric:        Behavior:  Behavior normal.     ED Results / Procedures / Treatments   Labs (all labs ordered are listed, but only abnormal results are displayed) Labs Reviewed  BASIC METABOLIC PANEL - Abnormal; Notable for the following components:      Result Value   Sodium 128 (*)    Chloride 93 (*)    CO2 21 (*)    Glucose, Bld 118 (*)    All other components within normal limits  CBC - Abnormal; Notable for the following components:   RBC 3.75 (*)    Hemoglobin 10.7 (*)    HCT 32.9 (*)    Platelets 463 (*)    All other components within normal limits  HEPATIC FUNCTION PANEL - Abnormal; Notable for the following components:   Albumin 3.1 (*)    Bilirubin, Direct 0.3 (*)    Indirect Bilirubin 0.1 (*)    All other components within normal limits  CBG MONITORING, ED - Abnormal; Notable for the following components:   Glucose-Capillary 120 (*)    All other components within normal limits  RESP PANEL BY RT-PCR (RSV, FLU A&B, COVID)  RVPGX2  LIPASE, BLOOD  URINALYSIS, ROUTINE W REFLEX MICROSCOPIC    EKG None  Radiology DG Chest 2 View  Result Date: 01/29/2023 CLINICAL DATA:  Shortness of breath.  Cough.  Weakness. EXAM: CHEST - 2 VIEW COMPARISON:  02/10/2019. FINDINGS: There is an approximately 8.4 by 10.9 by 14.5 cm mass centered in the superior segment of left lung lower lobe. No discrete rib destruction noted. Further evaluation with contrast-enhanced chest CT scan and tissue sampling is recommended. Bilateral lung fields are otherwise clear. Bilateral costophrenic angles are clear. Normal cardio-mediastinal silhouette. No acute osseous abnormalities. The soft tissues are within normal limits. IMPRESSION: Large left lung mass. Further evaluation with contrast-enhanced chest CT scan and tissue sampling is recommended. Electronically Signed   By: Jules Schick M.D.   On: 01/29/2023 12:32    Procedures Procedures  {Document cardiac monitor, telemetry assessment procedure when  appropriate:1}  Medications Ordered in ED Medications  sodium chloride 0.9 % bolus 1,000 mL (0 mLs Intravenous Stopped 01/29/23 1422)  iohexol (OMNIPAQUE) 300 MG/ML solution 75 mL (75 mLs Intravenous Contrast Given 01/29/23 1503)    ED Course/ Medical Decision Making/ A&P  Chest x-ray shows large mass in the left lung.  Patient would prefer outpatient treatment for now.  CT scan of the lungs pending {   Click here for ABCD2, HEART and other calculatorsREFRESH Note before signing :1}  Medical Decision Making Amount and/or Complexity of Data Reviewed Labs: ordered. Radiology: ordered.  Risk Prescription drug management.  Mass in the left lung.  CT scan pending   {Document critical care time when appropriate:1} {Document review of labs and clinical decision tools ie heart score, Chads2Vasc2 etc:1}  {Document your independent review of radiology images, and any outside records:1} {Document your discussion with family members, caretakers, and with consultants:1} {Document social determinants of health affecting pt's care:1} {Document your decision making why or why not admission, treatments were needed:1} Final Clinical Impression(s) / ED Diagnoses Final diagnoses:  None    Rx / DC Orders ED Discharge Orders     None

## 2023-01-29 NOTE — Telephone Encounter (Signed)
Pulmonology evaluation requested for L lung mass within 2 weeks.   Steffanie Dunn, DO 01/29/23 5:29 PM [facility]  For contact information, see Amion. If no response to pager, please call PCCM consult pager. After hours, 7PM- 7AM, please call Elink.

## 2023-01-30 ENCOUNTER — Ambulatory Visit: Payer: PPO | Admitting: Physical Therapy

## 2023-01-30 NOTE — Telephone Encounter (Signed)
Left voicemail for patient to schedule consult with BI or RB. Front staff-if patient returns call and is requesting a date where we need to over book, please let myself, Fredric Mare or Robynn Pane know.

## 2023-01-30 NOTE — Telephone Encounter (Signed)
Dr Tonia Brooms has an opening 9/9 - will work on getting them in on that day

## 2023-01-30 NOTE — Telephone Encounter (Signed)
I have her scheduled with [facility] on 02/11/23 at 9 am  Pt notified and verbalized understanding  Nothing further needed

## 2023-02-07 ENCOUNTER — Ambulatory Visit: Payer: PPO | Admitting: Physical Therapy

## 2023-02-11 ENCOUNTER — Institutional Professional Consult (permissible substitution): Payer: PPO | Admitting: Pulmonary Disease

## 2023-02-11 NOTE — Progress Notes (Deleted)
Synopsis: Referred in September 2024 for lung mass by Shon Hale, *  Subjective:   PATIENT ID: Zoe Kim GENDER: female DOB: [birth year], MRN: [id number]  No chief complaint on file.   This is a 77 year old female, past medical history of obesity, vitamin D deficiency, depression, skin cancer, family history of breast cancer and lung cancer, brother with lung cancer.  Patient is a never smoker.  Used to few cigarettes in college.  Patient presented to the ER with complaints of cough.  CT scan of the chest revealed a large soft tissue mass in the left hemithorax measuring 10.1 x 7.5 x 11.7 cm.  This was centered on the lower lobe and crosses the interlobar fissure.  There was no significant adenopathy.  Concerning for malignancy.    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   Anemia due to antineoplastic chemotherapy 12/24/2018   Arthritis    Atherosclerosis of aorta 05/23/2021   Chronic fatigue syndrome 04/25/2021   Decreased estrogen level 04/25/2021   Family history of breast cancer    Family history of lung cancer    GAD (generalized anxiety disorder) 04/25/2021   Heart murmur    "years ago"   History of adenomatous polyp of colon 04/25/2021   History of endometrial cancer    Insomnia    Major depressive disorder 04/25/2021   Neoplasm of uncertain behavior of skin    Obesity 10/14/2018   Peripheral neuropathy due to chemotherapy 11/20/2018   Personal history of malignant neoplasm of other parts of uterus 04/25/2021   Pneumonia    "long time ago"   Primary localized osteoarthritis of right knee 07/30/2016   Urge incontinence of urine 04/25/2021   Vitamin D deficiency 04/25/2021     Family History  Problem Relation Age of Onset   Heart disease Mother    COPD Mother    Cancer Mother        uterine (possibly, pt unsure)   Breast cancer Mother        dx in 65s   Dementia Mother         possible mini-strokes   Heart disease Father    COPD Brother    Heart disease Brother    Cancer Brother 74       lung ca, smoker   Cancer Maternal Grandmother        breast dx early 55s   Stroke Maternal Grandfather      Past Surgical History:  Procedure Laterality Date   COLONOSCOPY     EYE SURGERY Bilateral    cataract with lens   IR IMAGING GUIDED PORT INSERTION  11/06/2018   IR REMOVAL TUN ACCESS W/ PORT W/O FL MOD SED  02/13/2019   ROBOTIC ASSISTED TOTAL HYSTERECTOMY WITH BILATERAL SALPINGO OOPHERECTOMY N/A 10/14/2018   Procedure: ROBOTIC ASSISTED TOTAL HYSTERECTOMY WITH BILATERAL SALPINGO OOPHORECTOMY;  Surgeon: Adolphus Birchwood, MD;  Location: [facility];  Service: Gynecology;  Laterality: N/A;   SENTINEL NODE BIOPSY N/A 10/14/2018   Procedure: SENTINEL LYMPH NODE BIOPSY;  Surgeon: Adolphus Birchwood, MD;  Location: [facility];  Service: Gynecology;  Laterality: N/A;   TONSILLECTOMY     TOOTH EXTRACTION Right 08/27/2017   TOTAL KNEE ARTHROPLASTY Right 07/30/2016   Procedure: TOTAL KNEE ARTHROPLASTY;  Surgeon: Salvatore Marvel, MD;  Location: [facility] OR;  Service: Orthopedics;  Laterality: Right;   VEIN LIGATION AND STRIPPING      Social History   Socioeconomic History  Marital status: Married    Spouse name: Not on file   Number of children: 0   Years of education: 16   Highest education level: Bachelor's degree (e.g., BA, AB, BS)  Occupational History   Occupation: Retired    Comment: Advertising account planner  Tobacco Use   Smoking status: Never   Smokeless tobacco: Never   Tobacco comments:    smoked 2 weeks in college  Vaping Use   Vaping status: Never Used  Substance and Sexual Activity   Alcohol use: No   Drug use: No   Sexual activity: Not Currently  Other Topics Concern   Not on file  Social History Narrative   Right handed   No caffeine   Live with husband in one story home   retired   Chief Executive Officer Determinants of Corporate investment banker Strain: Low Risk  (05/30/2017)   Overall  Financial Resource Strain (CARDIA)    Difficulty of Paying Living Expenses: Not hard at all  Food Insecurity: No Food Insecurity (05/30/2017)   Hunger Vital Sign    Worried About Running Out of Food in the Last Year: Never true    Ran Out of Food in the Last Year: Never true  Transportation Needs: No Transportation Needs (05/30/2017)   PRAPARE - Administrator, Civil Service (Medical): No    Lack of Transportation (Non-Medical): No  Physical Activity: Inactive (05/30/2017)   Exercise Vital Sign    Days of Exercise per Week: 0 days    Minutes of Exercise per Session: 0 min  Stress: Stress Concern Present (05/30/2017)   [employer] of Occupational Health - Occupational Stress Questionnaire    Feeling of Stress : Very much  Social Connections: Moderately Isolated (05/30/2017)   Social Connection and Isolation Panel [NHANES]    Frequency of Communication with Friends and Family: Never    Frequency of Social Gatherings with Friends and Family: Never    Attends Religious Services: Never    Database administrator or Organizations: No    Attends Banker Meetings: Never    Marital Status: Married  Catering manager Violence: Not At Risk (05/30/2017)   Humiliation, Afraid, Rape, and Kick questionnaire    Fear of Current or Ex-Partner: No    Emotionally Abused: No    Physically Abused: No    Sexually Abused: No     Allergies  Allergen Reactions   Sulfa Antibiotics Hives    UNSPECIFIED REACTION    Sulfamethoxazole-Trimethoprim Other (See Comments)     Outpatient Medications Prior to Visit  Medication Sig Dispense Refill   benzonatate (TESSALON) 100 MG capsule Take 1 capsule (100 mg total) by mouth 3 (three) times daily as needed for cough. 21 capsule 0   buPROPion (WELLBUTRIN XL) 150 MG 24 hr tablet Take 150 mg by mouth every morning.     buPROPion (WELLBUTRIN XL) 300 MG 24 hr tablet 1 tablet in the morning Orally Once a day for 90 days     cyanocobalamin  (VITAMIN B12) 500 MCG tablet 1,000 mcg.     desvenlafaxine (PRISTIQ) 50 MG 24 hr tablet Take 1 tablet (50 mg total) by mouth daily. 90 tablet 1   donepezil (ARICEPT) 10 MG tablet TAKE   1 TABLET DAILY 30 tablet 11   gabapentin (NEURONTIN) 400 MG capsule Take by mouth.     oxybutynin (DITROPAN XL) 15 MG 24 hr tablet Take 15 mg by mouth at bedtime. (Patient not taking: Reported on 01/24/2023)  No facility-administered medications prior to visit.    ROS   Objective:  Physical Exam   There were no vitals filed for this visit.   on *** LPM *** RA BMI Readings from Last 3 Encounters:  01/29/23 29.14 kg/m  01/24/23 29.18 kg/m  07/19/22 31.58 kg/m   Wt Readings from Last 3 Encounters:  01/29/23 169 lb 12.1 oz (77 kg)  01/24/23 170 lb (77.1 kg)  07/19/22 184 lb (83.5 kg)     CBC    Component Value Date/Time   WBC 9.1 01/29/2023 1018   RBC 3.75 (L) 01/29/2023 1018   HGB 10.7 (L) 01/29/2023 1018   HGB 12.3 04/25/2021 0952   HCT 32.9 (L) 01/29/2023 1018   HCT 36.9 04/25/2021 0952   PLT 463 (H) 01/29/2023 1018   PLT 265 04/25/2021 0952   MCV 87.7 01/29/2023 1018   MCV 91 04/25/2021 0952   MCH 28.5 01/29/2023 1018   MCHC 32.5 01/29/2023 1018   RDW 14.8 01/29/2023 1018   RDW 12.5 04/25/2021 0952   LYMPHSABS 1.3 04/25/2021 0952   MONOABS 0.6 04/27/2019 0845   EOSABS 0.3 04/25/2021 0952   BASOSABS 0.1 04/25/2021 4540    ***  Chest Imaging: ***  Pulmonary Functions Testing Results:     No data to display          FeNO: ***  Pathology: ***  Echocardiogram: ***  Heart Catheterization: ***    Assessment & Plan:   No diagnosis found.  Discussion: ***   Current Outpatient Medications:    benzonatate (TESSALON) 100 MG capsule, Take 1 capsule (100 mg total) by mouth 3 (three) times daily as needed for cough., Disp: 21 capsule, Rfl: 0   buPROPion (WELLBUTRIN XL) 150 MG 24 hr tablet, Take 150 mg by mouth every morning., Disp: , Rfl:    buPROPion  (WELLBUTRIN XL) 300 MG 24 hr tablet, 1 tablet in the morning Orally Once a day for 90 days, Disp: , Rfl:    cyanocobalamin (VITAMIN B12) 500 MCG tablet, 1,000 mcg., Disp: , Rfl:    desvenlafaxine (PRISTIQ) 50 MG 24 hr tablet, Take 1 tablet (50 mg total) by mouth daily., Disp: 90 tablet, Rfl: 1   donepezil (ARICEPT) 10 MG tablet, TAKE   1 TABLET DAILY, Disp: 30 tablet, Rfl: 11   gabapentin (NEURONTIN) 400 MG capsule, Take by mouth., Disp: , Rfl:    oxybutynin (DITROPAN XL) 15 MG 24 hr tablet, Take 15 mg by mouth at bedtime. (Patient not taking: Reported on 01/24/2023), Disp: , Rfl:   I spent *** minutes dedicated to the care of this patient on the date of this encounter to include pre-visit review of records, face-to-face time with the patient discussing conditions above, post visit ordering of testing, clinical documentation with the electronic health record, making appropriate referrals as documented, and communicating necessary findings to members of the patients care team.   Josephine Igo, DO [facility] 02/11/2023 8:14 AM

## 2023-02-12 ENCOUNTER — Ambulatory Visit: Payer: PPO | Attending: Family Medicine | Admitting: Physical Therapy

## 2023-02-12 ENCOUNTER — Encounter (HOSPITAL_BASED_OUTPATIENT_CLINIC_OR_DEPARTMENT_OTHER): Payer: Self-pay | Admitting: Emergency Medicine

## 2023-02-12 ENCOUNTER — Other Ambulatory Visit: Payer: Self-pay

## 2023-02-12 ENCOUNTER — Emergency Department (HOSPITAL_BASED_OUTPATIENT_CLINIC_OR_DEPARTMENT_OTHER)
Admission: EM | Admit: 2023-02-12 | Discharge: 2023-02-12 | Disposition: A | Discharge status: Home / Self Care | Payer: PPO | Attending: Emergency Medicine | Admitting: Emergency Medicine

## 2023-02-12 ENCOUNTER — Emergency Department (HOSPITAL_BASED_OUTPATIENT_CLINIC_OR_DEPARTMENT_OTHER): Payer: PPO

## 2023-02-12 DIAGNOSIS — W19XXXA Unspecified fall, initial encounter: Secondary | ICD-10-CM | POA: Diagnosis not present

## 2023-02-12 DIAGNOSIS — S0101XA Laceration without foreign body of scalp, initial encounter: Secondary | ICD-10-CM | POA: Diagnosis not present

## 2023-02-12 DIAGNOSIS — S0181XA Laceration without foreign body of other part of head, initial encounter: Secondary | ICD-10-CM | POA: Insufficient documentation

## 2023-02-12 DIAGNOSIS — Z23 Encounter for immunization: Secondary | ICD-10-CM | POA: Diagnosis not present

## 2023-02-12 DIAGNOSIS — Z8542 Personal history of malignant neoplasm of other parts of uterus: Secondary | ICD-10-CM | POA: Insufficient documentation

## 2023-02-12 DIAGNOSIS — S0990XA Unspecified injury of head, initial encounter: Secondary | ICD-10-CM | POA: Diagnosis not present

## 2023-02-12 DIAGNOSIS — W01198A Fall on same level from slipping, tripping and stumbling with subsequent striking against other object, initial encounter: Secondary | ICD-10-CM | POA: Diagnosis not present

## 2023-02-12 DIAGNOSIS — I6782 Cerebral ischemia: Secondary | ICD-10-CM | POA: Diagnosis not present

## 2023-02-12 MED ORDER — LIDOCAINE-EPINEPHRINE (PF) 2 %-1:200000 IJ SOLN
10.0000 mL | Freq: Once | INTRAMUSCULAR | Status: AC
  Administered 2023-02-12: 10 mL
  Filled 2023-02-12: qty 20

## 2023-02-12 MED ORDER — TETANUS-DIPHTH-ACELL PERTUSSIS 5-2.5-18.5 LF-MCG/0.5 IM SUSY
0.5000 mL | PREFILLED_SYRINGE | Freq: Once | INTRAMUSCULAR | Status: AC
  Administered 2023-02-12: 0.5 mL via INTRAMUSCULAR
  Filled 2023-02-12: qty 0.5

## 2023-02-12 NOTE — ED Provider Notes (Signed)
[place] EMERGENCY DEPARTMENT AT [facility] Provider Note   CSN: [id number] Arrival date & time: 02/12/23  1307     History  Chief Complaint  Patient presents with   Zoe Kim is a 77 y.o. female.  Patient is a 77 year old female with a history of anemia, endometrial cancer, mild cognitive impairment who is having issues with her balance and was going to physical therapy today when she reports she was using 2 canes they got tangled up she lost her balance and fell forward hitting her head on the asphalt.  She denies any loss of consciousness.  No dizziness, syncope, chest pain or palpitations before the event.  She was able to get up and walk and only concern is of the laceration and pain in her forehead.  She has no problems with closing her jaw or pain.  No neck pain.  No injury to the arms or legs and is able to move them without difficulty.  She does not take anticoagulation.  The history is provided by the patient.  Fall       Home Medications Prior to Admission medications   Medication Sig Start Date End Date Taking? Authorizing Provider  benzonatate (TESSALON) 100 MG capsule Take 1 capsule (100 mg total) by mouth 3 (three) times daily as needed for cough. 01/29/23   Tegeler, Canary Brim, MD  buPROPion (WELLBUTRIN XL) 150 MG 24 hr tablet Take 150 mg by mouth every morning. 10/10/22   [provider]  buPROPion (WELLBUTRIN XL) 300 MG 24 hr tablet 1 tablet in the morning Orally Once a day for 90 days 10/10/22   [provider]  cyanocobalamin (VITAMIN B12) 500 MCG tablet 1,000 mcg. 12/28/22   [provider]  desvenlafaxine (PRISTIQ) 50 MG 24 hr tablet Take 1 tablet (50 mg total) by mouth daily. 05/23/21   Sonny Masters, FNP  donepezil (ARICEPT) 10 MG tablet TAKE   1 TABLET DAILY 01/24/23   Marcos Eke, PA-C  gabapentin (NEURONTIN) 400 MG capsule Take by mouth. 04/23/22   [provider]  oxybutynin (DITROPAN  XL) 15 MG 24 hr tablet Take 15 mg by mouth at bedtime. Patient not taking: Reported on 01/24/2023    [provider]      Allergies    Sulfa antibiotics and Sulfamethoxazole-trimethoprim    Review of Systems   Review of Systems  Physical Exam Updated Vital Signs BP (!) 151/81 (BP Location: Right Arm)   Pulse 93   Temp 98.5 F (36.9 C) (Oral)   Resp 15   Ht 5\' 6"  (1.676 m)   Wt 79.4 kg   SpO2 96%   BMI 28.25 kg/m  Physical Exam Vitals and nursing note reviewed.  Constitutional:      General: She is not in acute distress.    Appearance: She is well-developed.  HENT:     Head: Normocephalic and atraumatic.      Mouth/Throat:     Comments: No jaw pain and normal bite Eyes:     Pupils: Pupils are equal, round, and reactive to light.  Cardiovascular:     Rate and Rhythm: Normal rate and regular rhythm.     Heart sounds: Normal heart sounds. No murmur heard.    No friction rub.  Pulmonary:     Effort: Pulmonary effort is normal.     Breath sounds: Normal breath sounds. No wheezing or rales.  Abdominal:     General: Bowel sounds are  normal. There is no distension.     Palpations: Abdomen is soft.     Tenderness: There is no abdominal tenderness. There is no guarding or rebound.  Musculoskeletal:        General: No tenderness. Normal range of motion.     Cervical back: No spinous process tenderness or muscular tenderness.     Comments: No edema.  Full range of motion of shoulders, elbows, wrists, hips, knees and ankles without pain  Skin:    General: Skin is warm and dry.     Findings: No rash.  Neurological:     Mental Status: She is alert and oriented to person, place, and time.     Cranial Nerves: No cranial nerve deficit.  Psychiatric:        Behavior: Behavior normal.     ED Results / Procedures / Treatments   Labs (all labs ordered are listed, but only abnormal results are displayed) Labs Reviewed - No data to display  EKG None  Radiology CT  Head Wo Contrast  Result Date: 02/12/2023 CLINICAL DATA:  Head trauma, moderate-severe EXAM: CT HEAD WITHOUT CONTRAST TECHNIQUE: Contiguous axial images were obtained from the base of the skull through the vertex without intravenous contrast. RADIATION DOSE REDUCTION: This exam was performed according to the departmental dose-optimization program which includes automated exposure control, adjustment of the mA and/or kV according to patient size and/or use of iterative reconstruction technique. COMPARISON:  Brai nMR 01/10/23 FINDINGS: Brain: No evidence of hemorrhage, hydrocephalus, extra-axial collection or mass lesion/mass effect. Sequela of mild chronic microvascular ischemic change. Unchanged volume loss in the left middle cranial fossa/left temporal lobe. Vascular: No hyperdense vessel or unexpected calcification. Skull: Soft tissue laceration along the right frontal scalp. No evidence of underlying calvarial fracture. Sinuses/Orbits: No middle ear or mastoid effusion. Air-fluid level in the left sphenoid sinus, which can be seen in the setting of acute sinusitis. Bilateral lens replacement. Orbits are otherwise unremarkable. Other: None. IMPRESSION: 1. No CT evidence intracranial injury. 2. Soft tissue laceration along the right frontal scalp. No evidence of underlying calvarial fracture. Electronically Signed   By: Lorenza Cambridge M.D.   On: 02/12/2023 15:13    Procedures Procedures    Medications Ordered in ED Medications  lidocaine-EPINEPHrine (XYLOCAINE W/EPI) 2 %-1:200000 (PF) injection 10 mL (10 mLs Infiltration Given 02/12/23 1422)  Tdap (BOOSTRIX) injection 0.5 mL (0.5 mLs Intramuscular Given 02/12/23 1423)    ED Course/ Medical Decision Making/ A&P                                 Medical Decision Making Amount and/or Complexity of Data Reviewed Radiology: ordered and independent interpretation performed. Decision-making details documented in ED Course.  Risk Prescription drug  management.   Pt with multiple medical problems and comorbidities and presenting today with a complaint that caries a high risk for morbidity and mortality.  Here today after a fall and facial/head injury.  No anticoagulation.  Tetanus shot was due this year and she did not receive it.  Updated today.  Wound repaired as above.  No findings to suggest cervical injury or injury to the arms or legs.  Head CT to rule out intracranial injury pending.  3:42 PM I have independently visualized and interpreted pt's images today.  CT of the head is negative for intracranial bleed.  Radiology reports no acute findings.  Wound repaired by Solon Augusta PA per note.  Patient and husband given concussion precautions and return precautions.  No indication for further testing at this time.  No social determinants affecting the patient's discharge.  Stable for discharge at this time         Final Clinical Impression(s) / ED Diagnoses Final diagnoses:  Facial laceration, initial encounter    Rx / DC Orders ED Discharge Orders     None         Gwyneth Sprout, MD 02/12/23 1543

## 2023-02-12 NOTE — Discharge Instructions (Addendum)
Return to ER or follow-up with primary care in 5 days for stitch removal/consideration of removal. Bacitracin applied to laceration twice daily.  Keep area clean with warm soap and water before applying the ointment.  As we discussed, facial wounds very rarely become infected.  If you see signs of infection such as pus or worsening pain with redness that is spreading please return immediately to the emergency room.  Sutured repair Keep the laceration site dry for the next 24 hours and leave the dressing in place. After 24 hours you may remove the dressing and gently clean the laceration site with antibacterial soap and warm water. Do not scrub the area. Do not soak the area and water for long periods of time. Don't use hydrogen peroxide, iodine-based solutions, or alcohol, which can slow healing, and will probably be painful! Apply topical bacitracin 1-2 times per day for the next 3-5 days. Return to the emergency department in 5 days for removal of the sutures.  You should return sooner for any signs of infection which would include increased redness around the wound, increased swelling, new drainage of yellow pus.

## 2023-02-12 NOTE — ED Provider Notes (Signed)
  Physical Exam  BP (!) 151/81 (BP Location: Right Arm)   Pulse 93   Temp 98.5 F (36.9 C) (Oral)   Resp 15   Ht 5\' 6"  (1.676 m)   Wt 79.4 kg   SpO2 96%   BMI 28.25 kg/m   Physical Exam  Procedures  .Marland KitchenLaceration Repair  Date/Time: 02/12/2023 4:23 PM  Performed by: Gailen Shelter, PA Authorized by: Gailen Shelter, PA   Consent:    Consent obtained:  Verbal   Consent given by:  Parent and patient   Risks discussed:  Infection, pain, poor cosmetic result and poor wound healing Universal protocol:    Procedure explained and questions answered to patient or proxy's satisfaction: yes     Relevant documents present and verified: yes     Test results available: yes     Imaging studies available: yes     Required blood products, implants, devices, and special equipment available: yes     Site/side marked: yes     Immediately prior to procedure, a time out was called: yes     Patient identity confirmed:  Verbally with patient and arm band Anesthesia:    Anesthesia method:  Local infiltration   Local anesthetic:  Lidocaine 2% WITH epi Laceration details:    Location:  Face   Face location:  Forehead   Length (cm):  4   Laceration depth: to bone. Exploration:    Hemostasis achieved with:  Epinephrine   Imaging obtained comment:  CT   Imaging outcome: foreign body not noted     Wound extent: areolar tissue violated and muscle damage     Wound extent: fascia not violated and no foreign body     Contaminated: no   Treatment:    Amount of cleaning:  Standard   Irrigation solution:  Sterile saline   Irrigation volume:  Copious   Visualized foreign bodies/material removed: no     Layers/structures repaired:  Muscle belly Muscle belly:    Suture size:  6-0   Suture material:  Vicryl   Suture technique:  Simple interrupted   Number of sutures:  2 Skin repair:    Repair method:  Sutures   Suture size:  6-0   Suture material:  Nylon   Suture technique:  Simple  interrupted   Number of sutures:  5 Approximation:    Approximation:  Close Repair type:    Repair type:  Complex Post-procedure details:    Dressing:  Antibiotic ointment   Procedure completion:  Tolerated   ED Course / MDM    Medical Decision Making Amount and/or Complexity of Data Reviewed Radiology: ordered.  Risk Prescription drug management.   Patient with laceration to right forehead that is deep all the way to bone she has muscle damage which was repaired with 6-0 Vicryl 2 stitches.  Skin was closed with 6-0 nylon 5 stitches.  She will need to have stitches removed in 5 days.  Bacitracin ointment applied to laceration.  See Dr. Loralee Pacas note for full explanation of care.  She was updated on tetanus, good analgesia from lidocaine with epinephrine.  Bacitracin applied  Wound was thoroughly irrigated before closure.  Low suspicion for infection given location of laceration.  Wound care instruction provided.       Solon Augusta [place], [place] 02/12/23 1626    Gwyneth Sprout, MD 02/13/23 1341

## 2023-02-12 NOTE — ED Triage Notes (Signed)
Pt arrives to ED via [facility] with c/o fall. Pt notes she was getting out of her car and fell today. Pt hit her head and sustained a avulsion to right upper forehead.

## 2023-02-12 NOTE — ED Notes (Signed)
Pt given discharge instructions. Opportunities given for questions. Pt verbalizes understanding. Stone,Heather R, RN 

## 2023-02-18 ENCOUNTER — Inpatient Hospital Stay: Payer: PPO | Attending: Hematology and Oncology | Admitting: Hematology and Oncology

## 2023-02-18 ENCOUNTER — Inpatient Hospital Stay: Payer: PPO

## 2023-02-18 VITALS — BP 100/61 | HR 118 | Temp 100.0°F | Resp 18 | Ht 66.0 in | Wt 161.8 lb

## 2023-02-18 DIAGNOSIS — D6481 Anemia due to antineoplastic chemotherapy: Secondary | ICD-10-CM | POA: Diagnosis not present

## 2023-02-18 DIAGNOSIS — Z90710 Acquired absence of both cervix and uterus: Secondary | ICD-10-CM | POA: Diagnosis not present

## 2023-02-18 DIAGNOSIS — Z803 Family history of malignant neoplasm of breast: Secondary | ICD-10-CM | POA: Diagnosis not present

## 2023-02-18 DIAGNOSIS — Z801 Family history of malignant neoplasm of trachea, bronchus and lung: Secondary | ICD-10-CM | POA: Diagnosis not present

## 2023-02-18 DIAGNOSIS — Z90722 Acquired absence of ovaries, bilateral: Secondary | ICD-10-CM | POA: Insufficient documentation

## 2023-02-18 DIAGNOSIS — Z8542 Personal history of malignant neoplasm of other parts of uterus: Secondary | ICD-10-CM | POA: Insufficient documentation

## 2023-02-18 DIAGNOSIS — G3184 Mild cognitive impairment, so stated: Secondary | ICD-10-CM

## 2023-02-18 DIAGNOSIS — C541 Malignant neoplasm of endometrium: Secondary | ICD-10-CM

## 2023-02-18 DIAGNOSIS — R918 Other nonspecific abnormal finding of lung field: Secondary | ICD-10-CM | POA: Diagnosis not present

## 2023-02-18 DIAGNOSIS — F0394 Unspecified dementia, unspecified severity, with anxiety: Secondary | ICD-10-CM | POA: Diagnosis not present

## 2023-02-18 DIAGNOSIS — G620 Drug-induced polyneuropathy: Secondary | ICD-10-CM | POA: Diagnosis not present

## 2023-02-18 DIAGNOSIS — T451X5A Adverse effect of antineoplastic and immunosuppressive drugs, initial encounter: Secondary | ICD-10-CM | POA: Diagnosis not present

## 2023-02-18 DIAGNOSIS — Z7189 Other specified counseling: Secondary | ICD-10-CM

## 2023-02-18 NOTE — Progress Notes (Unsigned)
[facility] FOLLOW-UP progress notes  Patient Care Team: Shon Hale, MD as PCP - General (Family Medicine) Quintella Reichert, MD as PCP - Cardiology (Cardiology) Nelson Chimes, MD as Consulting Physician (Ophthalmology)  CHIEF COMPLAINTS/PURPOSE OF VISIT:  Anemia, lung mass  HISTORY OF PRESENTING ILLNESS:  Zoe Kim 77 y.o. female was last seen in the clinic more than 3 years ago.  She is billed as a new patient She is here accompanied by her husband The patient has no memory that she was ever treated here in the past She was diagnosed with uterine cancer and was treated with surgery, chemotherapy and radiation treatment She was lost to follow-up 3 years ago for unknown reason She has residual peripheral neuropathy from prior treatment and is very debilitated because of this Over the last 3 years, I have reviewed her chart and noted that the patient has progressive dementia Her husband is her healthcare power of attorney  She is noted to have some anemia as well as weight loss CT imaging from a month ago show evidence of large lung mass, worrisome for metastatic disease There were no reported signs of bleeding  I reviewed the patient's records extensive and collaborated the history with the patient. 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  The patient reports 6 months or maybe longer of intermittent vaginal spotting.  She was seen by Dr. Seymour Kim on September 19, 2018 who performed a Pap smear which revealed atypical glandular cells and an endocervical biopsy which revealed a benign endocervical polyp, and endometrial biopsy which revealed high-grade serous endometrial adenocarcinoma and adenocarcinoma FIGO grade 2.     10/03/2018 Imaging   CT abdomen and pelvis 1. Enhancing soft tissue density in endometrial cavity, consistent with history of endometrial carcinoma. No evidence of extra uterine tumor extension or metastatic disease. 2. Tiny less than 1 cm anterior uterine fibroid. 3. Small hiatal hernia.   Aortic Atherosclerosis (ICD10-I70.0).   10/14/2018 Pathology Results   1. Lymph node, sentinel, biopsy, right external iliac - METASTATIC CARCINOMA PRESENT IN (1) OF (1) LYMPH NODE. 2. Lymph node, sentinel, biopsy, right deep obturator - ONE LYMPH NODE, NEGATIVE FOR CARCINOMA (0/1). 3. Lymph node, sentinel, biopsy, left external iliac - ONE LYMPH NODE, NEGATIVE FOR CARCINOMA (0/1). 4. Uterus +/- tubes/ovaries, neoplastic, cervix, bilateral fallopian tubes and ovaries - MIXED CELL CARCINOMA OF ENDOMETRIUM (MIXED ENDOMETRIOID CARCINOMA AND SEROUS CARCINOMA), WITH INVASION LESS THAN HALF OF THE MYOMETRIUM. - NO INVOLVEMENT OF UTERINE SEROSA, CERVICAL STROMA OR ADNEXA. - SEE ONCOLOGY TABLE. Microscopic Comment 4. UTERUS, CARCINOMA OR CARCINOSARCOMA Procedure: Total hysterectomy and bilateral salpingo-oophorectomy Histologic type: Mixed cell carcinoma (Endometrioid carcinoma 70% and Serous carcinoma 30%) Histologic Grade: Not applicable Myometrial Invasion: Present Depth of invasion: 6 mm Myometrial thickness: 17 mm Uterine Serosa Involvement: Not identified Cervical Stromal Involvement: Not identified Extent of Involvement of Other Organs: Not involved Lymphovascular Invasion: Present Regional Lymph Nodes: Examined:  3 Sentinel 0 Non-sentinel 3 Total Lymph nodes with metastases: 1 Isolated tumor cells (< 0.2 mm): 0 Micrometastasis (> 0.2 mm and < 2.0 mm): 0 Macrometastasis (> 2.0 mm): 1 Extracapsular extension: No definite ECE MMR/MSI Testing: Will be performed and the results reported separately Pathologic Stage Classification (pTNM, AJCC 8th edition): pT1a, pN1a FIGO Stage: III-C1 Representative Tumor Block: 1-A Comment: The size of the metastatic deposit in the right external iliac sentinel lymph node is 32 mm. The metastatic deposit is virtually all serous carcinoma. Intradepartmental consultation was obtained   10/14/2018 Surgery   Pre-operative Diagnosis: endometrial cancer grade 3 (serous), obesity    Operation: Robotic-assisted laparoscopic total hysterectomy with bilateral salpingoophorectomy, SLN,  biopsy (22 modifier for obesity, BMI 32).   Surgeon: Zoe Kim    Operative Findings:  : 8cm arcuate uterus, normal appearing tubes and ovaries. Obesity with BMI 32. Narrow vaginal introitus.     10/17/2018 Cancer Staging   Staging form: Corpus Uteri - Carcinoma and Carcinosarcoma, AJCC 8th Edition - Pathologic: FIGO Stage IIIC1 (pT1, pN1a, cM0) - Signed by Zoe Delay, MD on 10/17/2018    Genetic Testing   Patient has genetic testing done for MMR. Results revealed patient has the following mutation(s): MMR: Normal    Genetic Testing   Patient has genetic testing done for MSI on pathology from 10/14/18. Results revealed patient has the following: MSI Stable   11/06/2018 Procedure   Placement of single lumen port a cath via right internal jugular vein. The catheter tip lies at the cavo-atrial junction. A power injectable port a cath was placed and is ready for immediate use.   11/10/2018 -  Chemotherapy   The patient had carboplatin and taxol for chemotherapy treatment.     11/10/2018 -  Chemotherapy   The patient had carboplatin and taxol for chemotherapy treatment.      04/27/2019 Imaging   Interval hysterectomy. No evidence of metastatic disease or other acute findings within the abdomen or pelvis.   Aortic Atherosclerosis (ICD10-I70.0).   01/29/2023 Imaging   As seen on x-ray there is a very large soft tissue mass in the left hemithorax measuring up to 10.1 x 7.5 x 11.7 cm. This has centered in the lower lobe but appears to cross the interlobar fissure. There is also areas that abuts the pleura and some abnormal left hilar nodes. Prominent but not pathologic mediastinal nodes. Overall the changes are worrisome for neoplasm recommend further evaluation such as PET-CT scan or percutaneous sampling.   Small hiatal hernia.   Fatty liver infiltration.   There is a 12 mm cystic lesion in the right thyroid lobe. Please correlate with any prior or dedicated thyroid ultrasound when clinically appropriate nonemergent.   Aortic Atherosclerosis (ICD10-I70.0).     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   Anemia due to antineoplastic chemotherapy 12/24/2018   Arthritis    Atherosclerosis of aorta 05/23/2021   Chronic fatigue syndrome 04/25/2021   Decreased estrogen level 04/25/2021   Family history of breast cancer    Family history of lung cancer    GAD (generalized anxiety disorder) 04/25/2021   Heart murmur    "years ago"   History of adenomatous polyp of colon 04/25/2021   History of endometrial cancer    Insomnia    Major depressive disorder 04/25/2021   Neoplasm of uncertain behavior of skin    Obesity 10/14/2018   Peripheral neuropathy due  to chemotherapy 11/20/2018   Personal history of malignant neoplasm of other parts of uterus 04/25/2021   Pneumonia    "long time ago"   Primary localized osteoarthritis of right knee 07/30/2016   Urge incontinence of urine 04/25/2021   Vitamin D deficiency 04/25/2021    SURGICAL HISTORY: Past Surgical History:   Procedure Laterality Date   COLONOSCOPY     EYE SURGERY Bilateral    cataract with lens   IR IMAGING GUIDED PORT INSERTION  11/06/2018   IR REMOVAL TUN ACCESS W/ PORT W/O FL MOD SED  02/13/2019   ROBOTIC ASSISTED TOTAL HYSTERECTOMY WITH BILATERAL SALPINGO OOPHERECTOMY N/A 10/14/2018   Procedure: ROBOTIC ASSISTED TOTAL HYSTERECTOMY WITH BILATERAL SALPINGO OOPHORECTOMY;  Surgeon: Adolphus Birchwood, MD;  Location: [facility] OR[facility];  Service: Gynecology;  Laterality: N/A;   SENTINEL NODE BIOPSY N/A 10/14/2018   Procedure: SENTINEL LYMPH NODE BIOPSY;  Surgeon: Adolphus Birchwood, MD;  Location: [facility];  Service: Gynecology;  Laterality: N/A;   TONSILLECTOMY     TOOTH EXTRACTION Right 08/27/2017   TOTAL KNEE ARTHROPLASTY Right 07/30/2016   Procedure: TOTAL KNEE ARTHROPLASTY;  Surgeon: Salvatore Marvel, MD;  Location: [facility] OR;  Service: Orthopedics;  Laterality: Right;   VEIN LIGATION AND STRIPPING      SOCIAL HISTORY: Social History   Socioeconomic History   Marital status: Married    Spouse name: Not on file   Number of children: 0   Years of education: 16   Highest education level: Bachelor's degree (e.g., BA, AB, BS)  Occupational History   Occupation: Retired    Comment: Advertising account planner  Tobacco Use   Smoking status: Never   Smokeless tobacco: Never   Tobacco comments:    smoked 2 weeks in college  Vaping Use   Vaping status: Never Used  Substance and Sexual Activity   Alcohol use: No   Drug use: No   Sexual activity: Not Currently  Other Topics Concern   Not on file  Social History Narrative   Right handed   No caffeine   Live with husband in one story home   retired   Chief Executive Officer Determinants of Corporate investment banker Strain: Low Risk  (05/30/2017)   Overall Financial Resource Strain (CARDIA)    Difficulty of Paying Living Expenses: Not hard at all  Food Insecurity: No Food Insecurity (05/30/2017)   Hunger Vital Sign    Worried About Running Out of Food in the Last Year: Never true    Ran Out  of Food in the Last Year: Never true  Transportation Needs: No Transportation Needs (05/30/2017)   PRAPARE - Administrator, Civil Service (Medical): No    Lack of Transportation (Non-Medical): No  Physical Activity: Inactive (05/30/2017)   Exercise Vital Sign    Days of Exercise per Week: 0 days    Minutes of Exercise per Session: 0 min  Stress: Stress Concern Present (05/30/2017)   [employer] of Occupational Health - Occupational Stress Questionnaire    Feeling of Stress : Very much  Social Connections: Moderately Isolated (05/30/2017)   Social Connection and Isolation Panel [NHANES]    Frequency of Communication with Friends and Family: Never    Frequency of Social Gatherings with Friends and Family: Never    Attends Religious Services: Never    Database administrator or Organizations: No    Attends Banker Meetings: Never    Marital Status: Married  Catering manager Violence: Not At Risk (05/30/2017)   Humiliation,  Afraid, Rape, and Kick questionnaire    Fear of Current or Ex-Partner: No    Emotionally Abused: No    Physically Abused: No    Sexually Abused: No    FAMILY HISTORY: Family History  Problem Relation Age of Onset   Heart disease Mother    COPD Mother    Cancer Mother        uterine (possibly, pt unsure)   Breast cancer Mother        dx in 64s   Dementia Mother        possible mini-strokes   Heart disease Father    COPD Brother    Heart disease Brother    Cancer Brother 74       lung ca, smoker   Cancer Maternal Grandmother        breast dx early 70s   Stroke Maternal Grandfather     ALLERGIES:  is allergic to sulfa antibiotics and sulfamethoxazole-trimethoprim.  MEDICATIONS:  Current Outpatient Medications  Medication Sig Dispense Refill   benzonatate (TESSALON) 100 MG capsule Take 1 capsule (100 mg total) by mouth 3 (three) times daily as needed for cough. 21 capsule 0   buPROPion (WELLBUTRIN XL) 150 MG 24 hr tablet  Take 150 mg by mouth every morning.     buPROPion (WELLBUTRIN XL) 300 MG 24 hr tablet 1 tablet in the morning Orally Once a day for 90 days     cyanocobalamin (VITAMIN B12) 500 MCG tablet 1,000 mcg.     desvenlafaxine (PRISTIQ) 50 MG 24 hr tablet Take 1 tablet (50 mg total) by mouth daily. 90 tablet 1   donepezil (ARICEPT) 10 MG tablet TAKE   1 TABLET DAILY 30 tablet 11   gabapentin (NEURONTIN) 400 MG capsule Take by mouth.     oxybutynin (DITROPAN XL) 15 MG 24 hr tablet Take 15 mg by mouth at bedtime. (Patient not taking: Reported on 01/24/2023)     No current facility-administered medications for this visit.    REVIEW OF SYSTEMS: Unable to assess due to dementia PHYSICAL EXAMINATION: ECOG PERFORMANCE STATUS: 2 - Symptomatic, <50% confined to bed  Vitals:   02/18/23 1507  BP: 100/61  Pulse: (!) 118  Resp: 18  Temp: 100 F (37.8 C)  SpO2: 98%   Filed Weights   02/18/23 1507  Weight: 161 lb 12.8 oz (73.4 kg)    GENERAL:alert, no distress and comfortable.  She has signs of muscle loss and looks a bit pale SKIN: skin color, texture, turgor are normal, no rashes or significant lesions EYES: normal, conjunctiva are pink and non-injected, sclera clear OROPHARYNX:no exudate, normal lips, buccal mucosa, and tongue  NECK: supple, thyroid normal size, non-tender, without nodularity LYMPH:  no palpable lymphadenopathy in the cervical, axillary or inguinal LUNGS: clear to auscultation and percussion with normal breathing effort HEART: regular rate & rhythm and no murmurs without lower extremity edema ABDOMEN:abdomen soft, non-tender and normal bowel sounds.  Somewhat difficult to examine while she is sitting on the wheelchair Musculoskeletal:no cyanosis of digits and no clubbing  PSYCH: alert with fluent speech  LABORATORY DATA:  I have reviewed the data as listed Lab Results  Component Value Date   WBC 9.1 01/29/2023   HGB 10.7 (L) 01/29/2023   HCT 32.9 (L) 01/29/2023   MCV 87.7  01/29/2023   PLT 463 (H) 01/29/2023   Recent Labs    01/29/23 1018  NA 128*  K 3.8  CL 93*  CO2 21*  GLUCOSE 118*  BUN  8  CREATININE 0.94  CALCIUM 8.9  GFRNONAA >60  PROT 7.3  ALBUMIN 3.1*  AST 19  ALT 12  ALKPHOS 122  BILITOT 0.4  BILIDIR 0.3*  IBILI 0.1*    RADIOGRAPHIC STUDIES: I have personally reviewed the radiological images as listed and agreed with the findings in the report. CT Head Wo Contrast  Result Date: 02/12/2023 CLINICAL DATA:  Head trauma, moderate-severe EXAM: CT HEAD WITHOUT CONTRAST TECHNIQUE: Contiguous axial images were obtained from the base of the skull through the vertex without intravenous contrast. RADIATION DOSE REDUCTION: This exam was performed according to the departmental dose-optimization program which includes automated exposure control, adjustment of the mA and/or kV according to patient size and/or use of iterative reconstruction technique. COMPARISON:  Brai nMR 01/10/23 FINDINGS: Brain: No evidence of hemorrhage, hydrocephalus, extra-axial collection or mass lesion/mass effect. Sequela of mild chronic microvascular ischemic change. Unchanged volume loss in the left middle cranial fossa/left temporal lobe. Vascular: No hyperdense vessel or unexpected calcification. Skull: Soft tissue laceration along the right frontal scalp. No evidence of underlying calvarial fracture. Sinuses/Orbits: No middle ear or mastoid effusion. Air-fluid level in the left sphenoid sinus, which can be seen in the setting of acute sinusitis. Bilateral lens replacement. Orbits are otherwise unremarkable. Other: None. IMPRESSION: 1. No CT evidence intracranial injury. 2. Soft tissue laceration along the right frontal scalp. No evidence of underlying calvarial fracture. Electronically Signed   By: Lorenza Cambridge M.D.   On: 02/12/2023 15:13   CT Chest W Contrast  Result Date: 01/29/2023 CLINICAL DATA:  Lung mass. EXAM: CT CHEST WITH CONTRAST TECHNIQUE: Multidetector CT imaging of  the chest was performed during intravenous contrast administration. RADIATION DOSE REDUCTION: This exam was performed according to the departmental dose-optimization program which includes automated exposure control, adjustment of the mA and/or kV according to patient size and/or use of iterative reconstruction technique. CONTRAST:  75mL OMNIPAQUE IOHEXOL 300 MG/ML  SOLN COMPARISON:  Chest x-ray 01/29/2023 FINDINGS: Cardiovascular: Thoracic aorta has a normal course and caliber with mild atherosclerotic calcified plaque. Heart is nonenlarged. No pericardial effusion. Coronary artery calcifications are seen. Mediastinum/Nodes: No specific abnormal lymph node enlargement identified in the axillary regions, right hilum. There is an enlarged left-sided hilar node on series 2, image 29 measuring 16 by 15 mm. There are some small mediastinal nodes identified. More numerous than usually seen but less than a cm in short axis. Example measuring 7 mm in short axis on series 2, image 32. Normal caliber thoracic esophagus with small hiatal hernia. There is cystic lesion in the right thyroid lobe measuring 12 mm. Recommend thyroid US (ref: J Am Coll Radiol. 2015 Feb;12(2): 143-50). Lungs/Pleura: There is large soft tissue mass in the left midlung centered along the lower lobe with extension across the course of the interlobar fissure into the left upper lobe. Slight adjacent stranding. Mass has dimensions approaching 10.1 by 7.5 by 11.7 cm. No additional lung masses are identified. There is some scattered dependent scarring and atelectatic changes of the both lungs. There is calcified nodule in the middle lobe consistent with old granulomatous disease. Slight bronchiectasis along the lower lobes. No pleural effusion or pneumothorax. The mass in the left lung does abut the pleura. Upper Abdomen: Adrenal glands are preserved. Fatty liver infiltration. Splenic granulomas. Musculoskeletal: Degenerative changes along the spine with  some areas of disc bulging and stenosis particularly along the lower thoracic spine. IMPRESSION: As seen on x-ray there is a very large soft tissue mass in the left  hemithorax measuring up to 10.1 x 7.5 x 11.7 cm. This has centered in the lower lobe but appears to cross the interlobar fissure. There is also areas that abuts the pleura and some abnormal left hilar nodes. Prominent but not pathologic mediastinal nodes. Overall the changes are worrisome for neoplasm recommend further evaluation such as PET-CT scan or percutaneous sampling. Small hiatal hernia. Fatty liver infiltration. There is a 12 mm cystic lesion in the right thyroid lobe. Please correlate with any prior or dedicated thyroid ultrasound when clinically appropriate nonemergent. Aortic Atherosclerosis (ICD10-I70.0). Electronically Signed   By: Karen Kays M.D.   On: 01/29/2023 16:25   DG Chest 2 View  Result Date: 01/29/2023 CLINICAL DATA:  Shortness of breath.  Cough.  Weakness. EXAM: CHEST - 2 VIEW COMPARISON:  02/10/2019. FINDINGS: There is an approximately 8.4 by 10.9 by 14.5 cm mass centered in the superior segment of left lung lower lobe. No discrete rib destruction noted. Further evaluation with contrast-enhanced chest CT scan and tissue sampling is recommended. Bilateral lung fields are otherwise clear. Bilateral costophrenic angles are clear. Normal cardio-mediastinal silhouette. No acute osseous abnormalities. The soft tissues are within normal limits. IMPRESSION: Large left lung mass. Further evaluation with contrast-enhanced chest CT scan and tissue sampling is recommended. Electronically Signed   By: Jules Schick M.D.   On: 01/29/2023 12:32    ASSESSMENT & PLAN:  Primary malignant neoplasm of endometrium [facility]) She has high risk endometrial cancer but lost to follow-up due to progressive dementia There is no doubt in my mind her recent finding of large lung mass is indicative of metastatic cancer of uterine primary I discussed  the risk and benefits of pursuing additional workup including CT scan of the abdomen and pelvis and biopsy of metastatic lesion Given her severity of her dementia, I could not recommend we proceed with palliative chemotherapy and chemotherapy will likely cause more harm than good She has some residual peripheral neuropathy limiting her mobility from before Frankly, I highly recommend consideration for palliative care and hospice to her husband We discussed prognosis with and without treatment  Lung mass The large lung mass likely represent metastatic disease rather than primary lung cancer Given severity of her dementia, I recommend not pursuing further workup including additional imaging and biopsy For symptom control, I recommend referral to palliative care with hospice for comfort measures such as nebulizer treatment, home oxygen and cough suppressant with nebulized morphine Her husband will think about it and will call me back with further decision  Peripheral neuropathy due to chemotherapy She has residual peripheral neuropathy from prior treatment that is limiting her mobility She would be at high risk of worsening neuropathy if she were to go back on chemotherapy  Amnestic MCI (mild cognitive impairment with memory loss) She has significant memory impairment and at this point in time, I do not feel strongly that she is able to give reasonable consent to treatment Her husband is her healthcare power of attorney I do not recommend for her to proceed with treatment  Anemia due to antineoplastic chemotherapy Anemia is likely due to untreated cancer   Goals of care, counseling/discussion I have extensive goals of care discussion with her husband Ultimately, he is undecided He is aware about her prognosis  No orders of the defined types were placed in this encounter.   All questions were answered. The patient knows to call the clinic with any problems, questions or concerns. The  total time spent in the appointment was  80 minutes encounter with patients including review of chart and various tests results, discussions about plan of care and coordination of care plan   Zoe Delay, MD 02/19/2023 9:19 AM

## 2023-02-18 NOTE — Assessment & Plan Note (Signed)
The large lung mass likely represent metastatic disease rather than primary lung cancer Given severity of her dementia, I recommend not pursuing further workup including additional imaging and biopsy For symptom control, I recommend referral to palliative care with hospice for comfort measures such as nebulizer treatment, home oxygen and cough suppressant with nebulized morphine Her husband will think about it and will call me back with further decision

## 2023-02-18 NOTE — Assessment & Plan Note (Signed)
She has high risk endometrial cancer but lost to follow-up due to progressive dementia There is no doubt in my mind her recent finding of large lung mass is indicative of metastatic cancer of uterine primary I discussed the risk and benefits of pursuing additional workup including CT scan of the abdomen and pelvis and biopsy of metastatic lesion Given her severity of her dementia, I could not recommend we proceed with palliative chemotherapy and chemotherapy will likely cause more harm than good She has some residual peripheral neuropathy limiting her mobility from before Frankly, I highly recommend consideration for palliative care and hospice to her husband We discussed prognosis with and without treatment

## 2023-02-19 ENCOUNTER — Encounter: Payer: Self-pay | Admitting: Hematology and Oncology

## 2023-02-19 DIAGNOSIS — Z7189 Other specified counseling: Secondary | ICD-10-CM | POA: Insufficient documentation

## 2023-02-19 NOTE — Assessment & Plan Note (Signed)
Anemia is likely due to untreated cancer

## 2023-02-19 NOTE — Assessment & Plan Note (Signed)
She has residual peripheral neuropathy from prior treatment that is limiting her mobility She would be at high risk of worsening neuropathy if she were to go back on chemotherapy

## 2023-02-19 NOTE — Assessment & Plan Note (Signed)
She has significant memory impairment and at this point in time, I do not feel strongly that she is able to give reasonable consent to treatment Her husband is her healthcare power of attorney I do not recommend for her to proceed with treatment

## 2023-02-19 NOTE — Assessment & Plan Note (Signed)
I have extensive goals of care discussion with her husband Ultimately, he is undecided He is aware about her prognosis

## 2023-02-21 ENCOUNTER — Emergency Department (HOSPITAL_BASED_OUTPATIENT_CLINIC_OR_DEPARTMENT_OTHER)
Admission: EM | Admit: 2023-02-21 | Discharge: 2023-02-21 | Disposition: A | Discharge status: Home / Self Care | Payer: PPO | Attending: Emergency Medicine | Admitting: Emergency Medicine

## 2023-02-21 ENCOUNTER — Other Ambulatory Visit: Payer: Self-pay

## 2023-02-21 ENCOUNTER — Other Ambulatory Visit (HOSPITAL_BASED_OUTPATIENT_CLINIC_OR_DEPARTMENT_OTHER): Payer: Self-pay

## 2023-02-21 DIAGNOSIS — Z96651 Presence of right artificial knee joint: Secondary | ICD-10-CM | POA: Insufficient documentation

## 2023-02-21 DIAGNOSIS — Z4802 Encounter for removal of sutures: Secondary | ICD-10-CM | POA: Insufficient documentation

## 2023-02-21 DIAGNOSIS — Z85828 Personal history of other malignant neoplasm of skin: Secondary | ICD-10-CM | POA: Insufficient documentation

## 2023-02-21 DIAGNOSIS — Z8542 Personal history of malignant neoplasm of other parts of uterus: Secondary | ICD-10-CM | POA: Diagnosis not present

## 2023-02-21 DIAGNOSIS — S0181XD Laceration without foreign body of other part of head, subsequent encounter: Secondary | ICD-10-CM | POA: Diagnosis not present

## 2023-02-21 NOTE — ED Provider Notes (Signed)
[facility] Provider Note   CSN: [id number] Arrival date & time: 02/21/23  1127     History  Chief Complaint  Patient presents with   Suture / Staple Removal    Zoe Kim is a 77 y.o. female presents today for suture removal.  Patient had 5 sutures placed about a week ago after fall.  She denies any bleeding, drainage, pain, headache, dizziness.   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   Anemia due to antineoplastic chemotherapy 12/24/2018   Arthritis    Atherosclerosis of aorta 05/23/2021   Chronic fatigue syndrome 04/25/2021   Decreased estrogen level 04/25/2021   Family history of breast cancer    Family history of lung cancer    GAD (generalized anxiety disorder) 04/25/2021   Heart murmur    "years ago"   History of adenomatous polyp of colon 04/25/2021   History of endometrial cancer    Insomnia    Major depressive disorder 04/25/2021   Neoplasm of uncertain behavior of skin    Obesity 10/14/2018   Peripheral neuropathy due to chemotherapy 11/20/2018   Personal history of malignant neoplasm of other parts of uterus 04/25/2021   Pneumonia    "long time ago"   Primary localized osteoarthritis of right knee 07/30/2016   Urge incontinence of urine 04/25/2021   Vitamin D deficiency 04/25/2021   Past Surgical History:  Procedure Laterality Date   COLONOSCOPY     EYE SURGERY Bilateral    cataract with lens   IR IMAGING GUIDED PORT INSERTION  11/06/2018   IR REMOVAL TUN ACCESS W/ PORT W/O FL MOD SED  02/13/2019   ROBOTIC ASSISTED TOTAL HYSTERECTOMY WITH BILATERAL SALPINGO OOPHERECTOMY N/A 10/14/2018   Procedure: ROBOTIC ASSISTED TOTAL HYSTERECTOMY WITH BILATERAL SALPINGO OOPHORECTOMY;  Surgeon: Adolphus Birchwood, MD;  Location: [facility] ORS;  Service: Gynecology;  Laterality: N/A;   SENTINEL NODE BIOPSY N/A 10/14/2018    Procedure: SENTINEL LYMPH NODE BIOPSY;  Surgeon: Adolphus Birchwood, MD;  Location: [facility];  Service: Gynecology;  Laterality: N/A;   TONSILLECTOMY     TOOTH EXTRACTION Right 08/27/2017   TOTAL KNEE ARTHROPLASTY Right 07/30/2016   Procedure: TOTAL KNEE ARTHROPLASTY;  Surgeon: Salvatore Marvel, MD;  Location: [facility] OR;  Service: Orthopedics;  Laterality: Right;   VEIN LIGATION AND STRIPPING       Home Medications Prior to Admission medications   Medication Sig Start Date End Date Taking? Authorizing Provider  benzonatate (TESSALON) 100 MG capsule Take 1 capsule (100 mg total) by mouth 3 (three) times daily as needed for cough. 01/29/23   Tegeler, Canary Brim, MD  buPROPion (WELLBUTRIN XL) 150 MG 24 hr tablet Take 150 mg by mouth every morning. 10/10/22   [provider]  buPROPion (WELLBUTRIN XL) 300 MG 24 hr tablet 1 tablet in the morning Orally Once a day for 90 days 10/10/22   [provider]  cyanocobalamin (VITAMIN B12) 500 MCG tablet 1,000 mcg. 12/28/22   [provider]  desvenlafaxine (PRISTIQ) 50 MG 24 hr tablet Take 1 tablet (50 mg total) by mouth daily. 05/23/21   Sonny Masters, FNP  donepezil (ARICEPT) 10 MG tablet TAKE   1 TABLET DAILY 01/24/23   Marcos Eke, PA-C  gabapentin (NEURONTIN) 400 MG capsule Take by mouth. 04/23/22   [provider]      Allergies    Sulfa antibiotics  and Sulfamethoxazole-trimethoprim    Review of Systems   Review of Systems Negative except as per HPI.  Physical Exam Updated Vital Signs BP 113/74   Pulse 70   Temp 98.1 F (36.7 C) (Temporal)   Resp 16   Ht 5\' 6"  (1.676 m)   Wt 73.4 kg   SpO2 100%   BMI 26.12 kg/m  Physical Exam Vitals and nursing note reviewed.  Constitutional:      Appearance: Normal appearance.  HENT:     Head: Normocephalic and atraumatic.     Comments: Healing wound on the right forehead with scab tissues.    Mouth/Throat:     Mouth: Mucous membranes are moist.  Eyes:     General: No  scleral icterus. Cardiovascular:     Rate and Rhythm: Normal rate and regular rhythm.     Pulses: Normal pulses.     Heart sounds: Normal heart sounds.  Pulmonary:     Effort: Pulmonary effort is normal.     Breath sounds: Normal breath sounds.  Abdominal:     General: Abdomen is flat.     Palpations: Abdomen is soft.     Tenderness: There is no abdominal tenderness.  Musculoskeletal:        General: No deformity.  Skin:    General: Skin is warm.     Findings: No rash.  Neurological:     General: No focal deficit present.     Mental Status: She is alert.  Psychiatric:        Mood and Affect: Mood normal.     ED Results / Procedures / Treatments   Labs (all labs ordered are listed, but only abnormal results are displayed) Labs Reviewed - No data to display  EKG None  Radiology No results found.  Procedures Procedures    Medications Ordered in ED Medications - No data to display  ED Course/ Medical Decision Making/ A&P                                 Medical Decision Making  77 year old female presents for suture removal.  Patient had 5 sutures placed a week ago after a fall.  She denies any pain, itching, drainage, bleeding.  A total of 5 sutures removed by this EDP.  Patient tolerated the procedure well.  No bleeding or other complications.  She is stable for discharge.  Disposition Continued outpatient therapy. Follow-up with PCP recommended for reevaluation of symptoms. Treatment plan discussed with patient.  Pt acknowledged understanding was agreeable to the plan. Worrisome signs and symptoms were discussed with patient, and patient acknowledged understanding to return to the ED if they noticed these signs and symptoms. Patient was stable upon discharge.   This chart was dictated using voice recognition software.  Despite best efforts to proofread,  errors can occur which can change the documentation meaning.          Final Clinical Impression(s) / ED  Diagnoses Final diagnoses:  Visit for suture removal    Rx / DC Orders ED Discharge Orders     None         Jeanelle Malling, [place] 02/21/23 1309    Cathren Laine, MD 02/26/23 [phone]

## 2023-02-21 NOTE — ED Triage Notes (Signed)
Patient arrives for suture removal on her head. Patient had sutures placed here over 1 week ago due to a fall. No fevers/chill since placement. No pain.

## 2023-02-22 ENCOUNTER — Telehealth: Payer: Self-pay | Admitting: *Deleted

## 2023-02-22 NOTE — Telephone Encounter (Signed)
Mr Maurine Minister (patient spouse) contacted office. He would like to schedule an appt with Dr. Bertis Ruddy to discuss plan of care for his wife.

## 2023-02-22 NOTE — Telephone Encounter (Signed)
Sent LOS to see her back on 9/30

## 2023-02-25 ENCOUNTER — Telehealth: Payer: Self-pay

## 2023-02-25 NOTE — Telephone Encounter (Signed)
Called and left a message for her husband to call the office back. Offering appt on 9/27.

## 2023-02-25 NOTE — Telephone Encounter (Signed)
Husband called back. Scheduled appt on 9/27 at 2 pm for 1 hour. Husband is aware of appt time/date.

## 2023-02-27 ENCOUNTER — Ambulatory Visit: Payer: PPO | Admitting: Emergency Medicine

## 2023-02-27 ENCOUNTER — Encounter: Payer: Self-pay | Admitting: Emergency Medicine

## 2023-02-27 VITALS — BP 124/74 | HR 98 | Ht 65.0 in | Wt 162.2 lb

## 2023-02-27 DIAGNOSIS — R918 Other nonspecific abnormal finding of lung field: Secondary | ICD-10-CM

## 2023-02-27 NOTE — Assessment & Plan Note (Signed)
I reviewed the notes from Dr. Bertis Ruddy.  She has recommended a transition to comfort care.  The patient's husband and the patient are both pushing for a tissue diagnosis and possible treatment.  I did explain to them that any treatment would likely be palliative and not curative.  She does have dyspnea, does have cough and maybe we can impact these things with some combination of therapy.  I believe the safest approach for biopsy would be transthoracic needle which would avoid general anesthesia.  I will put the referral in today.  She also needs a CT scan of her abdomen and pelvis and we will work on getting this scheduled.  I have asked him to follow-up with oncology to discuss options for treatment once the biopsy information is available.

## 2023-02-27 NOTE — Patient Instructions (Addendum)
We will arrange for a CT-guided needle biopsy of left lung mass in interventional radiology. We will schedule your CT scan of the abdomen and pelvis You will need to follow with Dr. Bertis Ruddy to review your biopsy results and decide what therapies would be beneficial once we have your biopsy results. We will work on getting you an incentive spirometer.  This will help encourage you to do slow deep breathing. Follow with Dr. Delton Coombes as needed

## 2023-02-27 NOTE — Progress Notes (Signed)
Subjective:    Patient ID: Zoe Kim, female    DOB: Sep 02, 1945, 77 y.o.   MRN: [id number]  HPI Zoe Kim is a 89, never smoker, with a history of uterine/endometrial cancer (surgery, chemotherapy, XRT), depression, peripheral neuropathy, osteoarthritis, dementia with progressive memory loss.  She is referred today for evaluation of an abnormal CT scan of the chest.  She saw Dr. Bertis Ruddy on 02/18/2023 and it was suspected that this was metastatic uterine cancer.  Given her dementia and overall debilitated state Dr. Bertis Ruddy as questioned whether further therapy or diagnostics would be appropriate.  She believes that a transition to palliation and hospice care would be appropriate.   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   Anemia due to antineoplastic chemotherapy 12/24/2018   Arthritis    Atherosclerosis of aorta 05/23/2021   Chronic fatigue syndrome 04/25/2021   Decreased estrogen level 04/25/2021   Family history of breast cancer    Family history of lung cancer    GAD (generalized anxiety disorder) 04/25/2021   Heart murmur    "years ago"   History of adenomatous polyp of colon 04/25/2021   History of endometrial cancer    Insomnia    Major depressive disorder 04/25/2021   Neoplasm of uncertain behavior of skin    Obesity 10/14/2018   Peripheral neuropathy due to chemotherapy 11/20/2018   Personal history of malignant neoplasm of other parts of uterus 04/25/2021   Pneumonia    "long time ago"   Primary localized osteoarthritis of right knee 07/30/2016   Urge incontinence of urine 04/25/2021   Vitamin D deficiency 04/25/2021     Family History  Problem Relation Age of Onset   Heart disease Mother    COPD Mother    Cancer Mother        uterine (possibly, pt unsure)   Breast cancer Mother        dx in 47s   Dementia Mother        possible mini-strokes    Heart disease Father    COPD Brother    Heart disease Brother    Cancer Brother 34       lung ca, smoker   Cancer Maternal Grandmother        breast dx early 66s   Stroke Maternal Grandfather      Social History   Socioeconomic History   Marital status: Married    Spouse name: Not on file   Number of children: 0   Years of education: 16   Highest education level: Bachelor's degree (e.g., BA, AB, BS)  Occupational History   Occupation: Retired    Comment: Advertising account planner  Tobacco Use   Smoking status: Never   Smokeless tobacco: Never   Tobacco comments:    smoked 2 weeks in college  Vaping Use   Vaping status: Never Used  Substance and Sexual Activity   Alcohol use: No   Drug use: No   Sexual activity: Not Currently  Other Topics Concern   Not on file  Social History Narrative   Right handed   No caffeine   Live with husband in one story home   retired   Chief Executive Officer Determinants of Corporate investment banker Strain: Low Risk  (05/30/2017)   Overall Financial Resource Strain (CARDIA)    Difficulty of Paying Living Expenses: Not hard at all  Food Insecurity: No Food  Insecurity (05/30/2017)   Hunger Vital Sign    Worried About Running Out of Food in the Last Year: Never true    Ran Out of Food in the Last Year: Never true  Transportation Needs: No Transportation Needs (05/30/2017)   PRAPARE - Administrator, Civil Service (Medical): No    Lack of Transportation (Non-Medical): No  Physical Activity: Inactive (05/30/2017)   Exercise Vital Sign    Days of Exercise per Week: 0 days    Minutes of Exercise per Session: 0 min  Stress: Stress Concern Present (05/30/2017)   [employer] of Occupational Health - Occupational Stress Questionnaire    Feeling of Stress : Very much  Social Connections: Moderately Isolated (05/30/2017)   Social Connection and Isolation Panel [NHANES]    Frequency of Communication with Friends and Family: Never    Frequency of  Social Gatherings with Friends and Family: Never    Attends Religious Services: Never    Database administrator or Organizations: No    Attends Banker Meetings: Never    Marital Status: Married  Catering manager Violence: Not At Risk (05/30/2017)   Humiliation, Afraid, Rape, and Kick questionnaire    Fear of Current or Ex-Partner: No    Emotionally Abused: No    Physically Abused: No    Sexually Abused: No     Allergies  Allergen Reactions   Sulfa Antibiotics Hives    UNSPECIFIED REACTION    Sulfamethoxazole-Trimethoprim Other (See Comments)     Outpatient Medications Prior to Visit  Medication Sig Dispense Refill   benzonatate (TESSALON) 100 MG capsule Take 1 capsule (100 mg total) by mouth 3 (three) times daily as needed for cough. 21 capsule 0   buPROPion (WELLBUTRIN XL) 150 MG 24 hr tablet Take 150 mg by mouth every morning.     buPROPion (WELLBUTRIN XL) 300 MG 24 hr tablet 1 tablet in the morning Orally Once a day for 90 days     cyanocobalamin (VITAMIN B12) 500 MCG tablet 1,000 mcg.     desvenlafaxine (PRISTIQ) 50 MG 24 hr tablet Take 1 tablet (50 mg total) by mouth daily. 90 tablet 1   donepezil (ARICEPT) 10 MG tablet TAKE   1 TABLET DAILY 30 tablet 11   gabapentin (NEURONTIN) 400 MG capsule Take by mouth.     No facility-administered medications prior to visit.        Objective:   Physical Exam Vitals:   02/27/23 1306  BP: 124/74  Pulse: 98  SpO2: 97%  Weight: 162 lb 3.2 oz (73.6 kg)  Height: 5\' 5"  (1.651 m)    Gen: Pleasant, well-nourished, in no distress,  normal affect  ENT: No lesions,  mouth clear,  oropharynx clear, no postnasal drip  Neck: No JVD, no stridor  Lungs: No use of accessory muscles, left-sided bronchial breath sounds and some rhonchi.  Clear on the right  Cardiovascular: RRR, heart sounds normal, no murmur or gallops, no peripheral edema  Musculoskeletal: No deformities, no cyanosis or clubbing  Neuro: alert, awake,  very poor short-term memory.  She does not recall the results of her scan, seeing Dr. Bertis Ruddy, etc.  Skin: Warm, no lesions or rash      Assessment & Plan:  Lung mass I reviewed the notes from Dr. Bertis Ruddy.  She has recommended a transition to comfort care.  The patient's husband and the patient are both pushing for a tissue diagnosis and possible treatment.  I did explain to them  that any treatment would likely be palliative and not curative.  She does have dyspnea, does have cough and maybe we can impact these things with some combination of therapy.  I believe the safest approach for biopsy would be transthoracic needle which would avoid general anesthesia.  I will put the referral in today.  She also needs a CT scan of her abdomen and pelvis and we will work on getting this scheduled.  I have asked him to follow-up with oncology to discuss options for treatment once the biopsy information is available.   Levy Pupa, MD, PhD 02/27/2023, 1:33 PM [facility] [phone] or if no answer before 7:00PM call [phone] For any issues after 7:00PM please call eLink [phone]

## 2023-02-27 NOTE — Addendum Note (Signed)
Addended by: Glynda Jaeger on: 02/27/2023 01:48 PM   Modules accepted: Orders

## 2023-02-28 ENCOUNTER — Encounter: Payer: Self-pay | Admitting: General Practice

## 2023-02-28 NOTE — Progress Notes (Unsigned)
Oley Balm, MD  Caroleen Hamman, NT Cc: Leslye Peer, MD Per Dr. Delton Coombes note they are getting CT A/P Please resubmit after this is finalized, for bx review  Thx DDH       Previous Messages    ----- Message ----- From: Caroleen Hamman, NT Sent: 02/27/2023   1:42 PM EDT To: Ir Procedure Requests Subject: CT Lung Mass Biopsy                            Procedure: CT Lund Mass Biopsy  Reason: Left lung mass Dx: Lung mass  History: ct and DG in chart  Provider: Leslye Peer, MD  Contact: [phone]

## 2023-03-01 ENCOUNTER — Encounter: Payer: Self-pay | Admitting: Hematology and Oncology

## 2023-03-01 ENCOUNTER — Telehealth: Payer: Self-pay

## 2023-03-01 ENCOUNTER — Inpatient Hospital Stay: Payer: PPO | Admitting: Hematology and Oncology

## 2023-03-01 VITALS — BP 92/58 | HR 122 | Temp 97.8°F | Resp 18 | Ht 65.0 in | Wt 160.1 lb

## 2023-03-01 DIAGNOSIS — R918 Other nonspecific abnormal finding of lung field: Secondary | ICD-10-CM | POA: Diagnosis not present

## 2023-03-01 DIAGNOSIS — G620 Drug-induced polyneuropathy: Secondary | ICD-10-CM

## 2023-03-01 DIAGNOSIS — T451X5A Adverse effect of antineoplastic and immunosuppressive drugs, initial encounter: Secondary | ICD-10-CM | POA: Diagnosis not present

## 2023-03-01 DIAGNOSIS — Z7189 Other specified counseling: Secondary | ICD-10-CM

## 2023-03-01 DIAGNOSIS — C541 Malignant neoplasm of endometrium: Secondary | ICD-10-CM

## 2023-03-01 NOTE — Assessment & Plan Note (Signed)
I have another goals of care discussion with her husband I am concerned that chemotherapy could harm her, potentially end her life sooner if she developed complications rather than prolong her life Ultimately, he still want to proceed with treatment

## 2023-03-01 NOTE — Telephone Encounter (Signed)
Called and ask husband to arrive with wife at 1 pm for appt today at 2 pm. He agreed and verbalized understanding.

## 2023-03-01 NOTE — Assessment & Plan Note (Signed)
This is causing significant cough, leading to an EMT visit last night Unfortunately, neither inhaler or cough suppressant will be helpful This is due to her metastatic disease and the only way to alleviate her symptom would be with palliative chemotherapy or hospice care with symptom management/oxygen

## 2023-03-01 NOTE — Progress Notes (Signed)
[facility] OFFICE PROGRESS NOTE  Zoe Kim Care Team: Shon Hale, MD as PCP - General (Family Medicine) Quintella Reichert, MD as PCP - Cardiology (Cardiology) Nelson Chimes, MD as Consulting Physician (Ophthalmology)  ASSESSMENT & PLAN:  Primary malignant neoplasm of endometrium [facility]) I have another goals of care discussion with Zoe Kim who is currently making all Zoe Kim medical decision I recommend CT imaging of the abdomen and pelvis to assess for another site of biopsy if possible, to avoid potential lung injury Assuming that we are dealing with the same disease, the Zoe Kim has metastatic stage IV cancer which is not curable While it is treatable, the Zoe Kim has significant memory loss and neuropathy from prior treatment, precluding aggressive treatment approach After much discussion, he is still interested for Zoe Kim to pursue aggressive approach with further imaging, biopsy and potential palliative chemotherapy I will see Zoe Kim back after test results are available  Peripheral neuropathy due to chemotherapy Zoe Kim has significant residual peripheral neuropathy and is very debilitated Zoe Kim will continue gabapentin I would not prescribe paclitaxel for Zoe Kim in the future  Lung mass This is causing significant cough, leading to an EMT visit last night Unfortunately, neither inhaler or cough suppressant will be helpful This is due to Zoe Kim metastatic disease and the only way to alleviate Zoe Kim symptom would be with palliative chemotherapy or hospice care with symptom management/oxygen  Goals of care, counseling/discussion I have another goals of care discussion with Zoe Kim I am concerned that chemotherapy could harm Zoe Kim, potentially end Zoe Kim life sooner if Zoe Kim developed complications rather than prolong Zoe Kim life Ultimately, he still want to proceed with treatment  Orders Placed This Encounter  Procedures   CT ABDOMEN PELVIS W CONTRAST    Standing Status:   Future     Standing Expiration Date:   02/29/2024    Order Specific Question:   If indicated for the ordered procedure, I authorize the administration of contrast media per Radiology protocol    Answer:   Yes    Order Specific Question:   Does the Zoe Kim have a contrast media/X-ray dye allergy?    Answer:   No    Order Specific Question:   Preferred imaging location?    Answer:   [facility]    Order Specific Question:   If indicated for the ordered procedure, I authorize the administration of oral contrast media per Radiology protocol    Answer:   Yes    All questions were answered. The Zoe Kim knows to call the clinic with any problems, questions or concerns. The total time spent in the appointment was 40 minutes encounter with patients including review of chart and various tests results, discussions about plan of care and coordination of care plan   Artis Delay, MD 03/01/2023 1:41 PM  INTERVAL HISTORY: Please see below for problem oriented charting. Zoe Kim returns for surveillance follow-up and further discussion about goals of care The Zoe Kim is very tired Zoe Kim had poor sleep last night Zoe Kim has significant uncontrolled coughing leading to EMT visit.  Zoe Kim just woke up and has not eaten anything Zoe Kim has very poor memory and has difficulty understanding the rationale behind further imaging, biopsy and risk/benefits of treatment Zoe Kim make all medical decisions for Zoe Kim I reviewed documentation from pulmonologist  I have reviewed the past medical history, past surgical history, social history and family history with the Zoe Kim and they are unchanged from previous note.  ALLERGIES:  is allergic to sulfa antibiotics and  sulfamethoxazole-trimethoprim.  MEDICATIONS:  Current Outpatient Medications  Medication Sig Dispense Refill   albuterol (VENTOLIN HFA) 108 (90 Base) MCG/ACT inhaler Inhale 2 puffs into the lungs 2 (two) times daily as needed.     Ascorbic Acid (VITAMIN C) 1000 MG  tablet Take 1,000 mg by mouth daily.     Cholecalciferol (VITAMIN D) 50 MCG (2000 UT) CAPS Take 2,000 Units by mouth daily.     Multiple Vitamins-Iron (MULTIVITAMINS WITH IRON) TABS tablet Take 1 tablet by mouth daily.     NON FORMULARY Take 1 tablet by mouth daily. Dynamic Brain vitamin     oxybutynin (DITROPAN) 5 MG tablet Take 15 mg by mouth daily.     benzonatate (TESSALON) 100 MG capsule Take 1 capsule (100 mg total) by mouth 3 (three) times daily as needed for cough. 21 capsule 0   buPROPion (WELLBUTRIN XL) 150 MG 24 hr tablet Take 150 mg by mouth every morning.     buPROPion (WELLBUTRIN XL) 300 MG 24 hr tablet 1 tablet in the morning Orally Once a day for 90 days     cyanocobalamin (VITAMIN B12) 500 MCG tablet Take 2,500 mcg by mouth daily.     desvenlafaxine (PRISTIQ) 50 MG 24 hr tablet Take 1 tablet (50 mg total) by mouth daily. 90 tablet 1   donepezil (ARICEPT) 10 MG tablet TAKE   1 TABLET DAILY (Zoe Kim taking differently: Take 5 mg by mouth daily. TAKE   1 TABLET DAILY) 30 tablet 11   gabapentin (NEURONTIN) 400 MG capsule Take 400 mg by mouth 2 (two) times daily. 2 capsules BID     No current facility-administered medications for this visit.    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  The Zoe Kim reports 6 months or maybe longer of intermittent vaginal spotting.  Zoe Kim  was seen by Dr. Seymour Bars on September 19, 2018 who performed a Pap smear which revealed atypical glandular cells and an endocervical biopsy which revealed a benign endocervical polyp, and endometrial biopsy which revealed high-grade serous endometrial adenocarcinoma and adenocarcinoma FIGO grade 2.     10/03/2018 Imaging   CT abdomen and pelvis 1. Enhancing soft tissue density in endometrial cavity, consistent with history of endometrial carcinoma. No evidence of extra uterine tumor extension or metastatic disease. 2. Tiny less than 1 cm anterior uterine fibroid. 3. Small hiatal hernia.   Aortic Atherosclerosis (ICD10-I70.0).   10/14/2018 Pathology Results   1. Lymph node, sentinel, biopsy, right external iliac - METASTATIC CARCINOMA PRESENT IN (1) OF (1) LYMPH NODE. 2. Lymph node, sentinel, biopsy, right deep obturator - ONE LYMPH NODE, NEGATIVE FOR CARCINOMA (0/1). 3. Lymph node, sentinel, biopsy, left external iliac - ONE LYMPH NODE, NEGATIVE FOR CARCINOMA (0/1). 4. Uterus +/- tubes/ovaries, neoplastic, cervix, bilateral fallopian tubes and ovaries - MIXED CELL CARCINOMA OF ENDOMETRIUM (MIXED ENDOMETRIOID CARCINOMA AND SEROUS CARCINOMA), WITH INVASION LESS THAN HALF OF THE MYOMETRIUM. - NO INVOLVEMENT OF UTERINE SEROSA, CERVICAL STROMA OR ADNEXA. -  SEE ONCOLOGY TABLE. Microscopic Comment 4. UTERUS, CARCINOMA OR CARCINOSARCOMA Procedure: Total hysterectomy and bilateral salpingo-oophorectomy Histologic type: Mixed cell carcinoma (Endometrioid carcinoma 70% and Serous carcinoma 30%) Histologic Grade: Not applicable Myometrial Invasion: Present Depth of invasion: 6 mm Myometrial thickness: 17 mm Uterine Serosa Involvement: Not identified Cervical Stromal Involvement: Not identified Extent of Involvement of Other Organs: Not involved Lymphovascular Invasion: Present Regional Lymph Nodes: Examined: 3 Sentinel 0 Non-sentinel 3 Total Lymph nodes with metastases: 1 Isolated tumor cells (<  0.2 mm): 0 Micrometastasis (> 0.2 mm and < 2.0 mm): 0 Macrometastasis (> 2.0 mm): 1 Extracapsular extension: No definite ECE MMR/MSI Testing: Will be performed and the results reported separately Pathologic Stage Classification (pTNM, AJCC 8th edition): pT1a, pN1a FIGO Stage: III-C1 Representative Tumor Block: 1-A Comment: The size of the metastatic deposit in the right external iliac sentinel lymph node is 32 mm. The metastatic deposit is virtually all serous carcinoma. Intradepartmental consultation was obtained   10/14/2018 Surgery   Pre-operative Diagnosis: endometrial cancer grade 3 (serous), obesity    Operation: Robotic-assisted laparoscopic total hysterectomy with bilateral salpingoophorectomy, SLN,  biopsy (22 modifier for obesity, BMI 32).   Surgeon: Quinn Axe    Operative Findings:  : 8cm arcuate uterus, normal appearing tubes and ovaries. Obesity with BMI 32. Narrow vaginal introitus.     10/17/2018 Cancer Staging   Staging form: Corpus Uteri - Carcinoma and Carcinosarcoma, AJCC 8th Edition - Pathologic: FIGO Stage IIIC1 (pT1, pN1a, cM0) - Signed by Artis Delay, MD on 10/17/2018    Genetic Testing   Zoe Kim has genetic testing done for MMR. Results revealed Zoe Kim has the following mutation(s): MMR: Normal    Genetic Testing   Zoe Kim has genetic testing done for MSI on pathology from 10/14/18. Results revealed Zoe Kim has the following: MSI Stable   11/06/2018 Procedure   Placement of single lumen port a cath via right internal jugular vein. The catheter tip lies at the cavo-atrial junction. A power injectable port a cath was placed and is ready for immediate use.   11/10/2018 -  Chemotherapy   The Zoe Kim had carboplatin and taxol for chemotherapy treatment.     11/10/2018 -  Chemotherapy   The Zoe Kim had carboplatin and taxol for chemotherapy treatment.     04/27/2019 Imaging   Interval hysterectomy. No evidence of metastatic disease or other acute  findings within the abdomen or pelvis.   Aortic Atherosclerosis (ICD10-I70.0).   01/29/2023 Imaging   As seen on x-ray there is a very large soft tissue mass in the left hemithorax measuring up to 10.1 x 7.5 x 11.7 cm. This has centered in the lower lobe but appears to cross the interlobar fissure. There is also areas that abuts the pleura and some abnormal left hilar nodes. Prominent but not pathologic mediastinal nodes. Overall the changes are worrisome for neoplasm recommend further evaluation such as PET-CT scan or percutaneous sampling.   Small hiatal hernia.   Fatty liver infiltration.   There is a 12 mm cystic lesion in the right thyroid lobe. Please correlate with any prior or dedicated thyroid ultrasound when clinically appropriate nonemergent.   Aortic Atherosclerosis (ICD10-I70.0).     PHYSICAL EXAMINATION: ECOG PERFORMANCE STATUS: 3 - Symptomatic, >50% confined to bed  Vitals:   03/01/23 1256  BP: (!) 92/58  Pulse: (!) 122  Resp: 18  Temp: 97.8 F (36.6 C)  SpO2: 100%   Filed Weights   03/01/23 1256  Weight: 160 lb 1.6 oz (72.6 kg)  GENERAL:alert, but Zoe Kim looks pale.  Zoe Kim looks uncomfortable, with recurrent bouts of coughing fits   LABORATORY DATA:  I have reviewed the data as listed    Component Value Date/Time   NA 128 (L) 01/29/2023 1018   NA 137 04/25/2021 0952   K 3.8 01/29/2023 1018   CL 93 (L) 01/29/2023 1018   CO2 21 (L) 01/29/2023 1018   GLUCOSE 118 (H) 01/29/2023 1018   BUN 8 01/29/2023 1018   BUN 12 04/25/2021 0952   CREATININE 0.94 01/29/2023 1018   CREATININE 0.86 04/27/2019 0845   CREATININE 0.86 05/22/2012 1415   CALCIUM 8.9 01/29/2023 1018   PROT 7.3 01/29/2023 1018   PROT 6.6 04/25/2021 0952   ALBUMIN 3.1 (L) 01/29/2023 1018   ALBUMIN 4.6 04/25/2021 0952   AST 19 01/29/2023 1018   AST 22 04/27/2019 0845   ALT 12 01/29/2023 1018   ALT 18 04/27/2019 0845   ALKPHOS 122 01/29/2023 1018   BILITOT 0.4 01/29/2023 1018   BILITOT 0.3  04/25/2021 0952   BILITOT 0.6 04/27/2019 0845   GFRNONAA >60 01/29/2023 1018   GFRNONAA >60 04/27/2019 0845   GFRAA >60 04/27/2019 0845    No results found for: "SPEP", "UPEP"  Lab Results  Component Value Date   WBC 9.1 01/29/2023   NEUTROABS 3.1 04/25/2021   HGB 10.7 (L) 01/29/2023   HCT 32.9 (L) 01/29/2023   MCV 87.7 01/29/2023   PLT 463 (H) 01/29/2023      Chemistry      Component Value Date/Time   NA 128 (L) 01/29/2023 1018   NA 137 04/25/2021 0952   K 3.8 01/29/2023 1018   CL 93 (L) 01/29/2023 1018   CO2 21 (L) 01/29/2023 1018   BUN 8 01/29/2023 1018   BUN 12 04/25/2021 0952   CREATININE 0.94 01/29/2023 1018   CREATININE 0.86 04/27/2019 0845   CREATININE 0.86 05/22/2012 1415      Component Value Date/Time   CALCIUM 8.9 01/29/2023 1018   ALKPHOS 122 01/29/2023 1018   AST 19 01/29/2023 1018   AST 22 04/27/2019 0845   ALT 12 01/29/2023 1018   ALT 18 04/27/2019 0845   BILITOT 0.4 01/29/2023 1018   BILITOT 0.3 04/25/2021 0952   BILITOT 0.6 04/27/2019 0845       RADIOGRAPHIC STUDIES: I have personally reviewed the radiological images as listed and agreed with the findings in the report. CT Head Wo Contrast  Result Date: 02/12/2023 CLINICAL DATA:  Head trauma, moderate-severe EXAM: CT HEAD WITHOUT CONTRAST TECHNIQUE: Contiguous axial images were obtained from the base of the skull through the vertex without intravenous contrast. RADIATION DOSE REDUCTION: This exam was performed according to the departmental dose-optimization program which includes automated exposure control, adjustment of the mA and/or kV according to Zoe Kim size and/or use of iterative reconstruction technique. COMPARISON:  Brai nMR 01/10/23 FINDINGS: Brain: No evidence of hemorrhage, hydrocephalus, extra-axial collection or mass lesion/mass effect. Sequela of mild chronic microvascular ischemic change. Unchanged volume loss in the left middle cranial fossa/left temporal lobe. Vascular: No hyperdense  vessel or unexpected calcification. Skull: Soft tissue laceration along the right frontal scalp. No evidence of underlying calvarial fracture. Sinuses/Orbits: No middle ear or mastoid effusion. Air-fluid level in the left sphenoid sinus, which can be seen in the setting of acute sinusitis. Bilateral lens replacement. Orbits are otherwise unremarkable. Other: None. IMPRESSION: 1. No CT evidence intracranial injury. 2. Soft tissue laceration along the right frontal scalp. No evidence of underlying calvarial fracture. Electronically  Signed   By: Lorenza Cambridge M.D.   On: 02/12/2023 15:13

## 2023-03-01 NOTE — Assessment & Plan Note (Signed)
She has significant residual peripheral neuropathy and is very debilitated She will continue gabapentin I would not prescribe paclitaxel for her in the future

## 2023-03-01 NOTE — Assessment & Plan Note (Signed)
I have another goals of care discussion with her husband who is currently making all her medical decision I recommend CT imaging of the abdomen and pelvis to assess for another site of biopsy if possible, to avoid potential lung injury Assuming that we are dealing with the same disease, the patient has metastatic stage IV cancer which is not curable While it is treatable, the patient has significant memory loss and neuropathy from prior treatment, precluding aggressive treatment approach After much discussion, he is still interested for her to pursue aggressive approach with further imaging, biopsy and potential palliative chemotherapy I will see her back after test results are available

## 2023-03-02 ENCOUNTER — Emergency Department (HOSPITAL_COMMUNITY)
Admission: EM | Admit: 2023-03-02 | Discharge: 2023-03-02 | Disposition: A | Discharge status: Home / Self Care | Payer: PPO | Attending: Emergency Medicine | Admitting: Emergency Medicine

## 2023-03-02 ENCOUNTER — Emergency Department (HOSPITAL_COMMUNITY): Payer: PPO

## 2023-03-02 ENCOUNTER — Other Ambulatory Visit: Payer: Self-pay

## 2023-03-02 ENCOUNTER — Encounter (HOSPITAL_COMMUNITY): Payer: Self-pay

## 2023-03-02 DIAGNOSIS — I2510 Atherosclerotic heart disease of native coronary artery without angina pectoris: Secondary | ICD-10-CM | POA: Diagnosis not present

## 2023-03-02 DIAGNOSIS — E871 Hypo-osmolality and hyponatremia: Secondary | ICD-10-CM | POA: Insufficient documentation

## 2023-03-02 DIAGNOSIS — R052 Subacute cough: Secondary | ICD-10-CM | POA: Insufficient documentation

## 2023-03-02 DIAGNOSIS — Z8542 Personal history of malignant neoplasm of other parts of uterus: Secondary | ICD-10-CM | POA: Diagnosis not present

## 2023-03-02 DIAGNOSIS — Z1152 Encounter for screening for COVID-19: Secondary | ICD-10-CM | POA: Insufficient documentation

## 2023-03-02 DIAGNOSIS — R531 Weakness: Secondary | ICD-10-CM | POA: Diagnosis not present

## 2023-03-02 DIAGNOSIS — F0390 Unspecified dementia without behavioral disturbance: Secondary | ICD-10-CM | POA: Diagnosis not present

## 2023-03-02 DIAGNOSIS — Z85828 Personal history of other malignant neoplasm of skin: Secondary | ICD-10-CM | POA: Insufficient documentation

## 2023-03-02 DIAGNOSIS — R200 Anesthesia of skin: Secondary | ICD-10-CM | POA: Insufficient documentation

## 2023-03-02 DIAGNOSIS — R918 Other nonspecific abnormal finding of lung field: Secondary | ICD-10-CM | POA: Diagnosis not present

## 2023-03-02 DIAGNOSIS — R059 Cough, unspecified: Secondary | ICD-10-CM | POA: Diagnosis not present

## 2023-03-02 LAB — CBC WITH DIFFERENTIAL/PLATELET
Abs Immature Granulocytes: 0.12 10*3/uL — ABNORMAL HIGH (ref 0.00–0.07)
Basophils Absolute: 0.1 10*3/uL (ref 0.0–0.1)
Basophils Relative: 1 %
Eosinophils Absolute: 0.1 10*3/uL (ref 0.0–0.5)
Eosinophils Relative: 1 %
HCT: 31.6 % — ABNORMAL LOW (ref 36.0–46.0)
Hemoglobin: 10.5 g/dL — ABNORMAL LOW (ref 12.0–15.0)
Immature Granulocytes: 1 %
Lymphocytes Relative: 12 %
Lymphs Abs: 1 10*3/uL (ref 0.7–4.0)
MCH: 28.7 pg (ref 26.0–34.0)
MCHC: 33.2 g/dL (ref 30.0–36.0)
MCV: 86.3 fL (ref 80.0–100.0)
Monocytes Absolute: 1.1 10*3/uL — ABNORMAL HIGH (ref 0.1–1.0)
Monocytes Relative: 13 %
Neutro Abs: 6.2 10*3/uL (ref 1.7–7.7)
Neutrophils Relative %: 72 %
Platelets: 487 10*3/uL — ABNORMAL HIGH (ref 150–400)
RBC: 3.66 MIL/uL — ABNORMAL LOW (ref 3.87–5.11)
RDW: 14.7 % (ref 11.5–15.5)
WBC: 8.6 10*3/uL (ref 4.0–10.5)
nRBC: 0 % (ref 0.0–0.2)

## 2023-03-02 LAB — BASIC METABOLIC PANEL
Anion gap: 12 (ref 5–15)
BUN: 12 mg/dL (ref 8–23)
CO2: 22 mmol/L (ref 22–32)
Calcium: 8.8 mg/dL — ABNORMAL LOW (ref 8.9–10.3)
Chloride: 91 mmol/L — ABNORMAL LOW (ref 98–111)
Creatinine, Ser: 1.05 mg/dL — ABNORMAL HIGH (ref 0.44–1.00)
GFR, Estimated: 54 mL/min — ABNORMAL LOW (ref >60)
Glucose, Bld: 114 mg/dL — ABNORMAL HIGH (ref 70–99)
Potassium: 3.8 mmol/L (ref 3.5–5.1)
Sodium: 125 mmol/L — ABNORMAL LOW (ref 135–145)

## 2023-03-02 LAB — SARS CORONAVIRUS 2 BY RT PCR: SARS Coronavirus 2 by RT PCR: NEGATIVE

## 2023-03-02 MED ORDER — SODIUM CHLORIDE 0.9 % IV BOLUS (SEPSIS)
500.0000 mL | Freq: Once | INTRAVENOUS | Status: AC
  Administered 2023-03-02: 500 mL via INTRAVENOUS

## 2023-03-02 MED ORDER — IOHEXOL 350 MG/ML SOLN
75.0000 mL | Freq: Once | INTRAVENOUS | Status: AC | PRN
  Administered 2023-03-02: 75 mL via INTRAVENOUS

## 2023-03-02 MED ORDER — IPRATROPIUM-ALBUTEROL 0.5-2.5 (3) MG/3ML IN SOLN
3.0000 mL | Freq: Once | RESPIRATORY_TRACT | Status: AC
  Administered 2023-03-02: 3 mL via RESPIRATORY_TRACT
  Filled 2023-03-02: qty 3

## 2023-03-02 NOTE — ED Triage Notes (Addendum)
Lower bilateral extremity weakness x a while, neuropathy for a long time , hx of cancer unsure what type, no chemo or radiation at this time. Husband reports possibility uterine cancer, with mets to L. Lower lung, husband reports dementia.   SOB and coughing fits x 3 months

## 2023-03-02 NOTE — ED Provider Notes (Signed)
[place] [facility]ENC[facility] DEPARTMENT AT [facility] Provider Note   CSN: [id number] Arrival date & time: 03/02/23  0455     History  Chief Complaint  Patient presents with   Extremity Weakness   Level 5 caveat due to dementia Zoe Kim is a 77 y.o. female.  The history is provided by the patient and the spouse.  Most of the history is provided by spouse as patient has a history of dementia Patient with extensive history including primary malignant neoplasm of endometrium, peripheral neuropathy due to chemotherapy, lung mass presents for multiple complaints.  She reports leg weakness and numbness for quite some time.  She also reports increasing cough.  Husband reports that her cough is been worse over the past 2 days and they have called EMS.  He also reports decreased appetite for patient and lack of energy. No fevers or vomiting.  No hemoptysis is reported.  Her chest only hurts when she coughs. 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   Anemia due to antineoplastic chemotherapy 12/24/2018   Arthritis    Atherosclerosis of aorta 05/23/2021   Chronic fatigue syndrome 04/25/2021   Decreased estrogen level 04/25/2021   Family history of breast cancer    Family history of lung cancer    GAD (generalized anxiety disorder) 04/25/2021   Heart murmur    "years ago"   History of adenomatous polyp of colon 04/25/2021   History of endometrial cancer    Insomnia    Major depressive disorder 04/25/2021   Neoplasm of uncertain behavior of skin    Obesity 10/14/2018   Peripheral neuropathy due to chemotherapy 11/20/2018   Personal history of malignant neoplasm of other parts of uterus 04/25/2021   Pneumonia    "long time ago"   Primary localized osteoarthritis of right knee 07/30/2016   Urge incontinence of urine 04/25/2021   Vitamin D deficiency 04/25/2021    Home Medications Prior to Admission  medications   Medication Sig Start Date End Date Taking? Authorizing Provider  albuterol (VENTOLIN HFA) 108 (90 Base) MCG/ACT inhaler Inhale 2 puffs into the lungs 2 (two) times daily as needed. 02/20/23   [provider]  Ascorbic Acid (VITAMIN C) 1000 MG tablet Take 1,000 mg by mouth daily.    [provider]  benzonatate (TESSALON) 100 MG capsule Take 1 capsule (100 mg total) by mouth 3 (three) times daily as needed for cough. 01/29/23   Tegeler, Canary Brim, MD  buPROPion (WELLBUTRIN XL) 150 MG 24 hr tablet Take 150 mg by mouth every morning. 10/10/22   [provider]  buPROPion (WELLBUTRIN XL) 300 MG 24 hr tablet 1 tablet in the morning Orally Once a day for 90 days 10/10/22   [provider]  Cholecalciferol (VITAMIN D) 50 MCG (2000 UT) CAPS Take 2,000 Units by mouth daily.    [provider]  cyanocobalamin (VITAMIN B12) 500 MCG tablet Take 2,500 mcg by mouth daily. 12/28/22   [provider]  desvenlafaxine (PRISTIQ) 50 MG 24 hr tablet Take 1 tablet (50 mg total) by mouth daily. 05/23/21   Sonny Masters, FNP  donepezil (ARICEPT) 10 MG tablet TAKE   1 TABLET DAILY Patient taking differently: Take 5 mg by mouth daily. TAKE   1 TABLET DAILY 01/24/23   Marcos Eke, PA-C  gabapentin (NEURONTIN) 400 MG capsule Take 400 mg by mouth 2 (two) times daily. 2 capsules BID 04/23/22  [provider]  Multiple Vitamins-Iron (MULTIVITAMINS WITH IRON) TABS tablet Take 1 tablet by mouth daily.    [provider]  NON FORMULARY Take 1 tablet by mouth daily. Dynamic Brain vitamin    [provider]  oxybutynin (DITROPAN) 5 MG tablet Take 15 mg by mouth daily.    [provider]      Allergies    Sulfa antibiotics and Sulfamethoxazole-trimethoprim    Review of Systems   Review of Systems  Unable to perform ROS: Dementia  Constitutional:  Negative for fever.  Respiratory:  Positive for cough and shortness of breath.    Gastrointestinal:  Negative for vomiting.  Neurological:  Positive for numbness.       Chronic numbness to her lower extremities    Physical Exam Updated Vital Signs BP 131/73   Pulse 97   Temp 98.7 F (37.1 C) (Oral)   Resp (!) 21   Ht 1.651 m (5\' 5" )   Wt 72.6 kg   SpO2 93%   BMI 26.63 kg/m  Physical Exam CONSTITUTIONAL: Elderly, no acute distress, coughs frequently during exam HEAD: Normocephalic/atraumatic EYES: EOMI/PERRL ENMT: Mucous membranes moist NECK: supple no meningeal signs CV: S1/S2 noted, tachycardic LUNGS: Lungs are clear to auscultation bilaterally, no apparent distress ABDOMEN: soft, nontender NEURO: Pt is awake/alert, moves all extremitiesx4.  No facial droop.  Patient appears pleasantly confused no arm or leg drift.  She reports numbness to both feet EXTREMITIES: pulses normal/equal, full ROM No deformities are noted, distal pulses equal and intact in both feet SKIN: warm, color normal  ED Results / Procedures / Treatments   Labs (all labs ordered are listed, but only abnormal results are displayed) Labs Reviewed  BASIC METABOLIC PANEL - Abnormal; Notable for the following components:      Result Value   Sodium 125 (*)    Chloride 91 (*)    Glucose, Bld 114 (*)    Creatinine, Ser 1.05 (*)    Calcium 8.8 (*)    GFR, Estimated 54 (*)    All other components within normal limits  CBC WITH DIFFERENTIAL/PLATELET - Abnormal; Notable for the following components:   RBC 3.66 (*)    Hemoglobin 10.5 (*)    HCT 31.6 (*)    Platelets 487 (*)    Monocytes Absolute 1.1 (*)    Abs Immature Granulocytes 0.12 (*)    All other components within normal limits  SARS CORONAVIRUS 2 BY RT PCR    EKG EKG Interpretation Date/Time:  Saturday March 02 2023 05:08:16 EDT Ventricular Rate:  114 PR Interval:  150 QRS Duration:  85 QT Interval:  319 QTC Calculation: 440 R Axis:   -72  Text Interpretation: Sinus tachycardia Inferior infarct, old Abnormal  lateral Q waves Probable anteroseptal infarct, old Confirmed by Zadie Rhine ([id number]) on 03/02/2023 5:15:56 AM  Radiology DG Chest Portable 1 View  Result Date: 03/02/2023 CLINICAL DATA:  Cough.  Lower bilateral extremity weakness. EXAM: PORTABLE CHEST 1 VIEW COMPARISON:  CT chest 01/29/2023. FINDINGS: Heart size and mediastinal contours are normal. Unchanged appearance of large left lung mass which measures 13.4 cm in cranial caudal dimension. No pleural fluid, interstitial edema or superimposed airspace consolidation. No pneumothorax identified. Visualized osseous structures appear intact. IMPRESSION: 1. No acute findings. 2. Unchanged appearance of large left lung mass. This remains worrisome for malignancy, either primary bronchogenic carcinoma versus metastatic disease. Electronically Signed   By: Signa Kell M.D.   On: 03/02/2023 06:22    Procedures  Procedures    Medications Ordered in ED Medications  sodium chloride 0.9 % bolus 500 mL (500 mLs Intravenous New Bag/Given 03/02/23 0655)  iohexol (OMNIPAQUE) 350 MG/ML injection 75 mL (has no administration in time range)  sodium chloride 0.9 % bolus 500 mL (has no administration in time range)  ipratropium-albuterol (DUONEB) 0.5-2.5 (3) MG/3ML nebulizer solution 3 mL (3 mLs Nebulization Given 03/02/23 0535)    ED Course/ Medical Decision Making/ A&P Clinical Course as of 03/02/23 0705  Sat Mar 02, 2023  0601 Patient presents for a variety of complaints that been ongoing for quite some time.  Patient is had peripheral neuropathy due to previous chemotherapy use that appears to be worsening.  She is also been having increasing cough that is unresponsive to home therapies likely due to her known lung mass.  Per oncology note from yesterday, husband is still pushing for aggressive treatment.  Tonight husband reports patient is had decreased appetite and generalized fatigue.  Labs and imaging are pending at this time [DW]  [phone] Labs  revealed mild acute on chronic hyponatremia.  She will be given IV fluids for this [DW]  [phone] Patient does remain tachycardic.  Patient was ambulated and was very minimal exertion her heart rate jumped up to the 140s but pulse ox remained in the mid 90s.  This tachycardia was seen on the recent oncology visit but not previously.  It is unclear if this tachycardia is coming from the lung mass or another process such as a pulmonary embolism as she would be high risk given her history of malignancy.  At this point we will explore further causes with a CT angio chest [DW]  0703 At signout to dr Andria Meuse, f/u on imaging and reassess.  If patient still tachycardic on ambulation she may need admit [DW]    Clinical Course User Index [DW] Zadie Rhine, MD                                 Medical Decision Making Amount and/or Complexity of Data Reviewed Labs: ordered. Radiology: ordered.  Risk Prescription drug management.   This patient presents to the ED for concern of cough, this involves an extensive number of treatment options, and is a complaint that carries with it a high risk of complications and morbidity.  The differential diagnosis includes but is not limited to URI, COVID-19, pneumonia, CHF, pulmonary embolism, GERD  Comorbidities that complicate the patient evaluation: Patient's presentation is complicated by their history of lung mass  Social Determinants of Health: Patient's  dementia and poor mobility   increases the complexity of managing their presentation  Additional history obtained: Additional history obtained from spouse Records reviewed  oncology records reviewed  Lab Tests: I Ordered, and personally interpreted labs.  The pertinent results include: Mild hyponatremia   Imaging Studies ordered: I ordered imaging studies including X-ray chest x-ray   I independently visualized and interpreted imaging which showed lung mass noted I agree with the radiologist  interpretation  Cardiac Monitoring: The patient was maintained on a cardiac monitor.  I personally viewed and interpreted the cardiac monitor which showed an underlying rhythm of:  sinus tachycardia  Medicines ordered and prescription drug management: I ordered medication including nebulizers for cough Reevaluation of the patient after these medicines showed that the patient    stayed the same   Reevaluation: After the interventions noted above, I reevaluated the patient and found that  they have :stayed the same  Complexity of problems addressed: Patient's presentation is most consistent with  acute presentation with potential threat to life or bodily function          Final Clinical Impression(s) / ED Diagnoses Final diagnoses:  Weakness  Subacute cough    Rx / DC Orders ED Discharge Orders     None         Zadie Rhine, MD 03/02/23 [phone]

## 2023-03-02 NOTE — ED Provider Notes (Signed)
  Physical Exam  BP 131/73   Pulse 97   Temp 98.7 F (37.1 C) (Oral)   Resp (!) 21   Ht 5\' 5"  (1.651 m)   Wt 72.6 kg   SpO2 93%   BMI 26.63 kg/m   Physical Exam  Procedures  Procedures  ED Course / MDM   Clinical Course as of 03/02/23 1610  Sat Mar 02, 2023  0601 Patient presents for a variety of complaints that been ongoing for quite some time.  Patient is had peripheral neuropathy due to previous chemotherapy use that appears to be worsening.  She is also been having increasing cough that is unresponsive to home therapies likely due to her known lung mass.  Per oncology note from yesterday, husband is still pushing for aggressive treatment.  Tonight husband reports patient is had decreased appetite and generalized fatigue.  Labs and imaging are pending at this time [DW]  [phone] Labs revealed mild acute on chronic hyponatremia.  She will be given IV fluids for this [DW]  [phone] Patient does remain tachycardic.  Patient was ambulated and was very minimal exertion her heart rate jumped up to the 140s but pulse ox remained in the mid 90s.  This tachycardia was seen on the recent oncology visit but not previously.  It is unclear if this tachycardia is coming from the lung mass or another process such as a pulmonary embolism as she would be high risk given her history of malignancy.  At this point we will explore further causes with a CT angio chest [DW]  0703 At signout to dr Andria Meuse, f/u on imaging and reassess.  If patient still tachycardic on ambulation she may need admit [DW]    Clinical Course User Index [DW] Zadie Rhine, MD   Medical Decision Making I, Anders Simmonds, assumed care for this patient.  In brief this is a 77 year old female is a signout pending CT angiography of the chest.  Patient CTA did not show any PE, did show mild increase in size of mass.  I discussed this with the patient and the patient's husband.  We ambulated the patient, her heart rate did increase to the  120s during ambulation.  Her O2 sat remained steady.  I do think that this increase in heart rate is due to the mass.  I discussed this with the patient and the patient's husband.  They preferred to return home with follow-up that they have scheduled for next week with her oncologist.  I think that this is reasonable.  The patient's mass is not going to improve without chemotherapy, and I believe that this is the cause for her intermittent tachycardia while ambulating.  I discussed how to do this safely while at home, reducing strenuous activity.  The patient and the patient's husband were agreeable with this and expressed understanding.  They know to return to the emergency department for any worsening symptoms.  Amount and/or Complexity of Data Reviewed Labs: ordered. Radiology: ordered.  Risk Prescription drug management.          Anders Simmonds T, DO 03/02/23 [phone]

## 2023-03-02 NOTE — ED Notes (Signed)
Pt ambulated in the hallway with a steady gait. SPO2 was 97% while pt became tachycardic in the 140s. MD made aware.

## 2023-03-02 NOTE — Discharge Instructions (Signed)
While you are in the emergency department, get a CT scan of your chest which did not show any blood clots in your lungs.  It did show that the mass that you have in your lungs is causing some mild compression on your bronchioles, which are an extension of your "windpipe."  Over the next several weeks, I would like you to be careful when walking.  Give yourself plenty of time, do not try to walk too quickly or walk too far.  If you start to feel short of breath or lightheaded, I would like you to sit down.  Come back to the emergency department if you develop difficulty with your breathing or lose consciousness.  When you meet with your oncologist, you can discuss appetite stimulants.  Sometimes they will prescribe a medication called mirtazapine.  Regarding sleep, I do recommend things like melatonin and chamomile tea.

## 2023-03-05 ENCOUNTER — Ambulatory Visit (HOSPITAL_BASED_OUTPATIENT_CLINIC_OR_DEPARTMENT_OTHER)
Admission: RE | Admit: 2023-03-05 | Discharge: 2023-03-05 | Disposition: A | Discharge status: Home / Self Care | Payer: PPO | Source: Ambulatory Visit | Attending: Hematology and Oncology | Admitting: Hematology and Oncology

## 2023-03-05 DIAGNOSIS — Z90710 Acquired absence of both cervix and uterus: Secondary | ICD-10-CM | POA: Diagnosis not present

## 2023-03-05 DIAGNOSIS — C55 Malignant neoplasm of uterus, part unspecified: Secondary | ICD-10-CM | POA: Diagnosis not present

## 2023-03-05 DIAGNOSIS — C541 Malignant neoplasm of endometrium: Secondary | ICD-10-CM | POA: Insufficient documentation

## 2023-03-05 MED ORDER — IOHEXOL 300 MG/ML  SOLN
100.0000 mL | Freq: Once | INTRAMUSCULAR | Status: AC | PRN
  Administered 2023-03-05: 80 mL via INTRAVENOUS

## 2023-03-06 NOTE — Progress Notes (Unsigned)
Zoe Mor, DO  [name] Good day.  I spoke with Dr. Bertis Ruddy about this patient.  She is requesting a CT guided left lung mass biopsy.  She tells me that that is an order in already.    OK for CT guided lung mass biopsy.  Concern for metastatic endometrial CA.  There is no other target in the chest or abdomen for bx.  Pulmonary feels "high risk for GETA."  [name]

## 2023-03-07 ENCOUNTER — Telehealth: Payer: Self-pay

## 2023-03-07 NOTE — Telephone Encounter (Signed)
Pt's spouse informed that per Dr. Bertis Ruddy, pt's CT abd/pelvis is clear and to expect a call from IR to schedule a lung biopsy. Pt's spouse informed that we will see them for an appt to review the biopsy appx 1 week after it is performed. Pt's spouse verbalized understanding and informed to please call us if he has any further questions.

## 2023-03-07 NOTE — Telephone Encounter (Signed)
LVM for pt's husband to please call back to provide updates on pt. Call back number [phone] provided.

## 2023-03-08 DIAGNOSIS — G9332 Myalgic encephalomyelitis/chronic fatigue syndrome: Secondary | ICD-10-CM | POA: Diagnosis not present

## 2023-03-08 DIAGNOSIS — R296 Repeated falls: Secondary | ICD-10-CM | POA: Diagnosis not present

## 2023-03-11 ENCOUNTER — Telehealth: Payer: Self-pay | Admitting: Oncology

## 2023-03-11 NOTE — Telephone Encounter (Signed)
Called Zoe Kim and advised him to please call [phone] and ask for [name] to scheduled [name]'s CT biopsy.  He verbalized understanding and agreement.

## 2023-03-12 ENCOUNTER — Telehealth: Payer: Self-pay | Admitting: Oncology

## 2023-03-12 ENCOUNTER — Inpatient Hospital Stay (HOSPITAL_BASED_OUTPATIENT_CLINIC_OR_DEPARTMENT_OTHER): Payer: PPO | Admitting: Pulmonary Disease

## 2023-03-12 NOTE — Telephone Encounter (Signed)
Left a message advising of follow up appointment with Dr. Bertis Ruddy on 04/02/2023 at 2:00 to review biopsy results.  Requested a return call to confirm.

## 2023-03-12 NOTE — Telephone Encounter (Signed)
Called Zoe Kim again regarding scheduling the lung biopsy.  He said he called Friday and left a message.  Advised him that he needs to call central scheduling's number and ask for [name].  He verbalized understanding and will call back if he is not able to schedule.

## 2023-03-12 NOTE — Telephone Encounter (Signed)
John called back and confirmed the appointment for 04/02/23.

## 2023-03-21 ENCOUNTER — Other Ambulatory Visit (HOSPITAL_COMMUNITY): Payer: Self-pay | Admitting: Student

## 2023-03-21 DIAGNOSIS — C541 Malignant neoplasm of endometrium: Secondary | ICD-10-CM

## 2023-03-22 ENCOUNTER — Other Ambulatory Visit: Payer: Self-pay | Admitting: Student

## 2023-03-22 DIAGNOSIS — Z1231 Encounter for screening mammogram for malignant neoplasm of breast: Secondary | ICD-10-CM | POA: Diagnosis not present

## 2023-03-25 ENCOUNTER — Ambulatory Visit (HOSPITAL_COMMUNITY)
Admission: RE | Admit: 2023-03-25 | Discharge: 2023-03-25 | Disposition: A | Discharge status: Home / Self Care | Payer: PPO | Source: Ambulatory Visit | Attending: Emergency Medicine | Admitting: Emergency Medicine

## 2023-03-25 ENCOUNTER — Other Ambulatory Visit: Payer: Self-pay | Admitting: Radiology

## 2023-03-25 ENCOUNTER — Encounter (HOSPITAL_COMMUNITY): Payer: Self-pay

## 2023-03-25 ENCOUNTER — Other Ambulatory Visit: Payer: Self-pay

## 2023-03-25 DIAGNOSIS — R413 Other amnesia: Secondary | ICD-10-CM | POA: Insufficient documentation

## 2023-03-25 DIAGNOSIS — C7800 Secondary malignant neoplasm of unspecified lung: Secondary | ICD-10-CM | POA: Insufficient documentation

## 2023-03-25 DIAGNOSIS — R918 Other nonspecific abnormal finding of lung field: Secondary | ICD-10-CM | POA: Insufficient documentation

## 2023-03-25 DIAGNOSIS — C541 Malignant neoplasm of endometrium: Secondary | ICD-10-CM | POA: Diagnosis not present

## 2023-03-25 DIAGNOSIS — C3432 Malignant neoplasm of lower lobe, left bronchus or lung: Secondary | ICD-10-CM | POA: Diagnosis not present

## 2023-03-25 LAB — CBC
HCT: 32.5 % — ABNORMAL LOW (ref 36.0–46.0)
Hemoglobin: 10.3 g/dL — ABNORMAL LOW (ref 12.0–15.0)
MCH: 27.8 pg (ref 26.0–34.0)
MCHC: 31.7 g/dL (ref 30.0–36.0)
MCV: 87.6 fL (ref 80.0–100.0)
Platelets: 514 10*3/uL — ABNORMAL HIGH (ref 150–400)
RBC: 3.71 MIL/uL — ABNORMAL LOW (ref 3.87–5.11)
RDW: 14.8 % (ref 11.5–15.5)
WBC: 10 10*3/uL (ref 4.0–10.5)
nRBC: 0 % (ref 0.0–0.2)

## 2023-03-25 LAB — PROTIME-INR
INR: 1 (ref 0.8–1.2)
Prothrombin Time: 13.8 s (ref 11.4–15.2)

## 2023-03-25 MED ORDER — SODIUM CHLORIDE 0.9% FLUSH: 3.0000 mL | Freq: Two times a day (BID) | INTRAVENOUS | Status: DC

## 2023-03-25 MED ORDER — GELATIN ABSORBABLE 12-7 MM EX MISC
1.0000 | Freq: Once | CUTANEOUS | Status: AC
  Administered 2023-03-25: 1 via TOPICAL

## 2023-03-25 MED ORDER — FENTANYL CITRATE (PF) 100 MCG/2ML IJ SOLN
INTRAMUSCULAR | Status: AC | PRN
Start: 2023-03-25 — End: 2023-03-25
  Administered 2023-03-25: 25 ug via INTRAVENOUS
  Administered 2023-03-25: 50 ug via INTRAVENOUS

## 2023-03-25 MED ORDER — MIDAZOLAM HCL 2 MG/2ML IJ SOLN
INTRAMUSCULAR | Status: AC
  Filled 2023-03-25: qty 2

## 2023-03-25 MED ORDER — MIDAZOLAM HCL 2 MG/2ML IJ SOLN
INTRAMUSCULAR | Status: AC | PRN
  Administered 2023-03-25 (×3): .5 mg via INTRAVENOUS

## 2023-03-25 MED ORDER — LIDOCAINE HCL 1 % IJ SOLN
10.0000 mL | Freq: Once | INTRAMUSCULAR | Status: AC
  Administered 2023-03-25: 10 mL

## 2023-03-25 MED ORDER — FENTANYL CITRATE (PF) 100 MCG/2ML IJ SOLN
INTRAMUSCULAR | Status: AC
  Filled 2023-03-25: qty 2

## 2023-03-25 MED ORDER — SODIUM CHLORIDE 0.9 % IV SOLN: INTRAVENOUS | Status: DC

## 2023-03-25 NOTE — H&P (Signed)
Chief Complaint: Patient was seen in consultation today for left lung mass biopsy at the request of ZoeRobert Kim  Referring Physician(Kim): Zoe Kim  Supervising Physician: Zoe Kim  Patient Status: [name] - Out-pt  History of Present Illness: Zoe Kim is a 77 y.o. female   FULL Code status per pt and husband in room Known Endometrial cancer Mild memory loss-- husband at bedside Follows with Zoe Kim Recent illness with cough--- to ED CT revealing IMPRESSION: 1. No evidence for acute pulmonary embolus. 2. Interval increase in size of left lower lobe mass with signs of left hilar involvement. There is encasement and narrowing of the left lower lobe pulmonary artery and its branches. 3. New filling defect at the bifurcation of the distal left mainstem bronchus is identified which may reflect an area of mucous plugging versus endobronchial tumor.  Zoe Kim note 03/01/23: Lung mass This is causing significant cough, leading to an EMT visit last night Unfortunately, neither inhaler or cough suppressant will be helpful This is due to her metastatic disease and the only way to alleviate her symptom would be with palliative chemotherapy or hospice care with symptom management/oxygen Goals of care, counseling/discussion I have another goals of care discussion with her husband I am concerned that chemotherapy could harm her, potentially end her life sooner if she developed complications rather than prolong her life Ultimately, he still want to proceed with treatment  Zoe Kim note 9/25 Lung mass I reviewed the notes from Zoe. Bertis Kim.  She has recommended a transition to comfort care.  The patient'Kim husband and the patient are both pushing for a tissue diagnosis and possible treatment.  I did explain to them that any treatment would likely be palliative and not curative.  She does have dyspnea, does have cough and maybe we can impact these things with some  combination of therapy.  I believe the safest approach for biopsy would be transthoracic needle which would avoid general anesthesia.  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   Anemia due to antineoplastic chemotherapy 12/24/2018   Arthritis    Atherosclerosis of aorta 05/23/2021   Chronic fatigue syndrome 04/25/2021   Decreased estrogen level 04/25/2021   Family history of breast cancer    Family history of lung cancer    GAD (generalized anxiety disorder) 04/25/2021   Heart murmur    "years ago"   History of adenomatous polyp of colon 04/25/2021   History of endometrial cancer    Insomnia    Major depressive disorder 04/25/2021   Neoplasm of uncertain behavior of skin    Obesity 10/14/2018   Peripheral neuropathy due to chemotherapy 11/20/2018   Personal history of malignant neoplasm of other parts of uterus 04/25/2021   Pneumonia    "long time ago"   Primary localized osteoarthritis of right knee 07/30/2016   Urge incontinence of urine 04/25/2021   Vitamin D deficiency 04/25/2021    Past Surgical History:  Procedure Laterality Date   COLONOSCOPY     EYE SURGERY Bilateral    cataract with lens   IR IMAGING GUIDED PORT INSERTION  11/06/2018   IR REMOVAL TUN ACCESS W/ PORT W/O FL MOD SED  02/13/2019   ROBOTIC ASSISTED TOTAL HYSTERECTOMY WITH BILATERAL SALPINGO OOPHERECTOMY N/A 10/14/2018   Procedure: ROBOTIC ASSISTED TOTAL HYSTERECTOMY WITH BILATERAL SALPINGO  OOPHORECTOMY;  Surgeon: Zoe Birchwood, MD;  Location: [facility] ORS;  Service: Gynecology;  Laterality: N/A;   SENTINEL NODE BIOPSY N/A 10/14/2018   Procedure: SENTINEL LYMPH NODE BIOPSY;  Surgeon: Zoe Birchwood, MD;  Location: [facility] ORS;  Service: Gynecology;  Laterality: N/A;   TONSILLECTOMY     TOOTH EXTRACTION Right 08/27/2017   TOTAL KNEE ARTHROPLASTY Right 07/30/2016    Procedure: TOTAL KNEE ARTHROPLASTY;  Surgeon: Zoe Marvel, MD;  [facility];  Service: Orthopedics;  Laterality: Right;   VEIN LIGATION AND STRIPPING      Allergies: Sulfa antibiotics and Sulfamethoxazole-trimethoprim  Medications: Prior to Admission medications   Medication Sig Start Date End Date Taking? Authorizing Provider  Ascorbic Acid (VITAMIN C) 1000 MG tablet Take 1,000 mg by mouth daily.   Yes [provider]  benzonatate (TESSALON) 100 MG capsule Take 1 capsule (100 mg total) by mouth 3 (three) times daily as needed for cough. 01/29/23  Yes Tegeler, Canary Brim, MD  buPROPion (WELLBUTRIN XL) 150 MG 24 hr tablet Take 150 mg by mouth every morning. 10/10/22  Yes [provider]  buPROPion (WELLBUTRIN XL) 300 MG 24 hr tablet 1 tablet in the morning Orally Once a day for 90 days 10/10/22  Yes [provider]  Cholecalciferol (VITAMIN D) 50 MCG (2000 UT) CAPS Take 2,000 Units by mouth daily.   Yes [provider]  desvenlafaxine (PRISTIQ) 50 MG 24 hr tablet Take 1 tablet (50 mg total) by mouth daily. 05/23/21  Yes Rakes, Doralee Albino, FNP  donepezil (ARICEPT) 10 MG tablet TAKE   1 TABLET DAILY Patient taking differently: Take 5 mg by mouth daily. TAKE   1 TABLET DAILY 01/24/23  Yes Marcos Eke, PA-C  gabapentin (NEURONTIN) 400 MG capsule Take 400 mg by mouth 2 (two) times daily. 2 capsules BID 04/23/22  Yes [provider]  Multiple Vitamins-Iron (MULTIVITAMINS WITH IRON) TABS tablet Take 1 tablet by mouth daily.   Yes [provider]  NON FORMULARY Take 1 tablet by mouth daily. Dynamic Brain vitamin   Yes [provider]  oxybutynin (DITROPAN) 5 MG tablet Take 15 mg by mouth daily.   Yes [provider]  albuterol (VENTOLIN HFA) 108 (90 Base) MCG/ACT inhaler Inhale 2 puffs into the lungs 2 (two) times daily as needed. 02/20/23   [provider]  cyanocobalamin (VITAMIN B12) 500 MCG tablet Take 2,500 mcg by  mouth daily. 12/28/22   [provider]     Family History  Problem Relation Age of Onset   Heart disease Mother    COPD Mother    Cancer Mother        uterine (possibly, pt unsure)   Breast cancer Mother        dx in 30s   Dementia Mother        possible mini-strokes   Heart disease Father    COPD Brother    Heart disease Brother    Cancer Brother 76       lung ca, smoker   Cancer Maternal Grandmother        breast dx early 55s   Stroke Maternal Grandfather     Social History   Socioeconomic History   Marital status: Married    Spouse name: Not on file   Number of children: 0   Years of education: 16   Highest education level: Bachelor'Kim degree (e.g., BA, AB, BS)  Occupational History   Occupation: Retired    Comment: Advertising account planner  Tobacco Use   Smoking status: Never   Smokeless tobacco: Never   Tobacco comments:    smoked 2 weeks in college  Vaping Use   Vaping status: Never Used  Substance and Sexual Activity   Alcohol use: No   Drug use: No   Sexual activity: Not Currently  Other Topics Concern   Not on file  Social History Narrative   Right handed   No caffeine   Live with husband in one story home   retired   Chief Executive Officer Determinants of Corporate investment banker Strain: Low Risk  (05/30/2017)   Overall Financial Resource Strain (CARDIA)    Difficulty of Paying Living Expenses: Not hard at all  Food Insecurity: No Food Insecurity (05/30/2017)   Hunger Vital Sign    Worried About Running Out of Food in the Last Year: Never true    Ran Out of Food in the Last Year: Never true  Transportation Needs: No Transportation Needs (05/30/2017)   PRAPARE - Administrator, Civil Service (Medical): No    Kim of Transportation (Non-Medical): No  Physical Activity: Inactive (05/30/2017)   Exercise Vital Sign    Days of Exercise per Week: 0 days    Minutes of Exercise per Session: 0 min  Stress: Stress Concern Present (05/30/2017)   [employer] of Occupational Health - Occupational Stress Questionnaire    Feeling of Stress : Very much  Social Connections: Moderately Isolated (05/30/2017)   Social Connection and Isolation Panel [NHANES]    Frequency of Communication with Friends and Family: Never    Frequency of Social Gatherings with Friends and Family: Never    Attends Religious Services: Never    Database administrator or Organizations: No    Attends Engineer, structural: Never    Marital Status: Married    Review of Systems: A 12 point ROS discussed and pertinent positives are indicated in the HPI above.  All other systems are negative.  Review of Systems  Constitutional:  Negative for activity change, fatigue and fever.  Respiratory:  Positive for cough and shortness of breath. Negative for wheezing.   Cardiovascular:  Negative for chest pain.  Gastrointestinal:  Negative for abdominal pain.  Neurological:  Negative for weakness.  Psychiatric/Behavioral:  Positive for decreased concentration. Negative for behavioral problems and confusion.     Vital Signs: BP 123/84   Pulse (!) 120   Temp (!) 97.3 F (36.3 C) (Temporal)   Resp 19   Ht 5\' 5"  (1.651 m)   Wt 160 lb (72.6 kg)   SpO2 95%   BMI 26.63 kg/m   Advance Care Plan: The advanced care plan/surrogate decision maker was discussed at the time of visit and documented in the medical record.    Physical Exam Vitals reviewed.  HENT:     Mouth/Throat:     Mouth: Mucous membranes are moist.  Cardiovascular:     Rate and Rhythm: Normal rate and regular rhythm.     Heart sounds: Normal heart sounds.  Pulmonary:     Effort: Pulmonary effort is normal.     Breath sounds: Normal breath sounds. No wheezing.  Abdominal:     Palpations: Abdomen is soft.  Musculoskeletal:        General: Normal range of motion.  Skin:    General: Skin is warm.  Neurological:     Mental Status: She is alert and oriented to person, place, and time.      Comments:  Mild memory loss Also had husband sign consent  Psychiatric:        Behavior: Behavior normal.     Imaging: CT ABDOMEN PELVIS W CONTRAST  Result Date: 03/14/2023 CLINICAL DATA:  Uterine cancer, lung mass, suspected metastatic disease EXAM: CT ABDOMEN AND PELVIS WITH CONTRAST TECHNIQUE: Multidetector CT imaging of the abdomen and pelvis was performed using the standard protocol following bolus administration of intravenous contrast. RADIATION DOSE REDUCTION: This exam was performed according to the departmental dose-optimization program which includes automated exposure control, adjustment of the mA and/or kV according to patient size and/or use of iterative reconstruction technique. CONTRAST:  80mL OMNIPAQUE IOHEXOL 300 MG/ML  SOLN COMPARISON:  Partial comparison to CTA chest dated 02/22/2023. CT abdomen/pelvis dated 04/27/2019. FINDINGS: Lower chest: Large left lower lobe mass, incompletely visualized, measuring at least 8.9 cm. Hepatobiliary: Liver is within normal limits. Gallbladder is unremarkable. No intrahepatic or extrahepatic duct dilatation. Pancreas: Within normal limits. Spleen: Calcified splenic granulomata. Adrenals/Urinary Tract: Adrenal glands are within normal limits. Kidneys are within normal limits.  No hydronephrosis. Bladder is within normal limits. Stomach/Bowel: Stomach is within normal limits. No evidence of bowel obstruction. Normal appendix (series 2/image 56). No colonic wall thickening or inflammatory changes. Vascular/Lymphatic: No evidence of abdominal aortic aneurysm. Atherosclerotic calcifications of the abdominal aorta and branch vessels, although vessels remain patent. No suspicious abdominopelvic lymphadenopathy. Reproductive: Status post hysterectomy. No adnexal masses. Other: No abdominopelvic ascites. Musculoskeletal: Degenerative changes of the visualized thoracolumbar spine. IMPRESSION: Status post hysterectomy. No evidence of metastatic disease in the  abdomen/pelvis. Large left lower lobe mass, incompletely visualized, better evaluated on recent CTA chest. Electronically Signed   By: Charline Bills M.D.   On: 03/14/2023 02:13   CT Angio Chest PE W and/or Wo Contrast  Result Date: 03/02/2023 CLINICAL DATA:  Evaluate for pulmonary embolism. Complains of lower extremity weakness. History of endometrial carcinoma with pulmonary metastasis. * Tracking Code: BO * EXAM: CT ANGIOGRAPHY CHEST WITH CONTRAST TECHNIQUE: Multidetector CT imaging of the chest was performed using the standard protocol during bolus administration of intravenous contrast. Multiplanar CT image reconstructions and MIPs were obtained to evaluate the vascular anatomy. RADIATION DOSE REDUCTION: This exam was performed according to the departmental dose-optimization program which includes automated exposure control, adjustment of the mA and/or kV according to patient size and/or use of iterative reconstruction technique. CONTRAST:  75mL OMNIPAQUE IOHEXOL 350 MG/ML SOLN COMPARISON:  01/29/2023 FINDINGS: Cardiovascular: Satisfactory opacification of the pulmonary arteries to the segmental level. No evidence of pulmonary embolism. Normal heart size. No pericardial effusion. Aortic atherosclerosis. Coronary artery calcifications. Mediastinum/Nodes: Bilateral thyroid nodules. The largest is in the right lobe measuring 1.4 cm, image 17/4. (ref: J Am Coll Radiol. 2015 Feb;12(2): 143-50). Trachea and esophagus are unremarkable. No enlarged mediastinal, supraclavicular or axillary lymph nodes. Right hilar lymph node measures 8 mm. Lungs/Pleura: Mass within the left lower lobe is again noted with signs of left hilar involvement. This measures 11.0 x 8.4 by 12.9 cm (volume = 620 cm^3), image 71/2. On the previous exam this measured 9.5 by 7.3 by 11.6 cm (volume = 420 cm^3). There is encasement and narrowing of the left lower lobe pulmonary artery and its branches. New filling defect at the bifurcation of  the distal left mainstem bronchus is identified which may reflect an area of mucous plugging versus endobronchial tumor. This is new compared with the previous exam measuring 0.9 x 0.7, image 72/6 calcified granuloma identified within the right upper lobe. Upper Abdomen: No acute abnormality.  Small hiatal hernia. Musculoskeletal: No chest wall abnormality. No acute or significant osseous findings. Review of the MIP images confirms the above findings. IMPRESSION: 1. No evidence for acute pulmonary embolus. 2. Interval increase in size of left lower lobe mass with signs of left hilar involvement. There is encasement and narrowing of the left lower lobe pulmonary artery and its branches. 3. New filling defect at the bifurcation of the distal left mainstem bronchus is identified which may reflect an area of mucous plugging versus endobronchial tumor. 4. Coronary artery calcifications. 5. Small hiatal hernia. 6.  Aortic Atherosclerosis (ICD10-I70.0). Electronically Signed   By: Signa Kell M.D.   On: 03/02/2023 07:47   DG Chest Portable 1 View  Result Date: 03/02/2023 CLINICAL DATA:  Cough.  Lower bilateral extremity weakness. EXAM: PORTABLE CHEST 1 VIEW COMPARISON:  CT chest 01/29/2023. FINDINGS: Heart size and mediastinal contours are normal. Unchanged appearance of large left lung mass which measures 13.4 cm in cranial caudal dimension. No pleural fluid, interstitial edema or superimposed airspace consolidation. No pneumothorax identified. Visualized osseous structures appear intact. IMPRESSION: 1. No acute findings. 2. Unchanged appearance of large left lung mass. This remains worrisome for malignancy, either primary bronchogenic carcinoma versus metastatic disease. Electronically Signed   By: Signa Kell M.D.   On: 03/02/2023 06:22    Labs:  CBC: Recent Labs    01/29/23 1018 03/02/23 0532  WBC 9.1 8.6  HGB 10.7* 10.5*  HCT 32.9* 31.6*  PLT 463* 487*    COAGS: No results for input(Kim):  "INR", "APTT" in the last 8760 hours.  BMP: Recent Labs    01/29/23 1018 03/02/23 0532  NA 128* 125*  K 3.8 3.8  CL 93* 91*  CO2 21* 22  GLUCOSE 118* 114*  BUN 8 12  CALCIUM 8.9 8.8*  CREATININE 0.94 1.05*  GFRNONAA >60 54*    LIVER FUNCTION TESTS: Recent Labs    01/29/23 1018  BILITOT 0.4  AST 19  ALT 12  ALKPHOS 122  PROT 7.3  ALBUMIN 3.1*    TUMOR MARKERS: No results for input(Kim): "AFPTM", "CEA", "CA199", "CHROMGRNA" in the last 8760 hours.  Assessment and Plan:  Risks and benefits of CT guided lung nodule biopsy was discussed with the patient and her husband including, but not limited to bleeding, hemoptysis, respiratory failure requiring intubation, infection, pneumothorax requiring chest tube placement, stroke from air embolism or even death.  All of the patient'Kim questions were answered and the patient is agreeable to proceed. Consent signed and in chart.  Thank you for this interesting consult.  I greatly enjoyed meeting Zoe Kim and look forward to participating in their care.  A copy of this report was sent to the requesting provider on this date.  Electronically Signed: Robet Leu, PA-C 03/25/2023, 7:10 AM   I spent a total of  30 Minutes   in face to face in clinical consultation, greater than 50% of which was counseling/coordinating care for left lung mass biopsy

## 2023-03-25 NOTE — Procedures (Signed)
Interventional Radiology Procedure Note  Procedure: CT Guided Biopsy of LLL lung mass  Complications: None  Estimated Blood Loss: < 10 mL  Findings: 18 G core biopsy of LLL lung mass performed under CT guidance.  Two core samples obtained and sent to Pathology.  Jodi Marble. Fredia Sorrow, M.D Pager:  [phone]

## 2023-03-25 NOTE — Progress Notes (Signed)
Patient walked to the bathroom without difficulties

## 2023-03-27 ENCOUNTER — Telehealth: Payer: Self-pay | Admitting: Internal Medicine

## 2023-03-27 ENCOUNTER — Other Ambulatory Visit: Payer: Self-pay

## 2023-03-27 DIAGNOSIS — R918 Other nonspecific abnormal finding of lung field: Secondary | ICD-10-CM

## 2023-03-27 LAB — SURGICAL PATHOLOGY

## 2023-03-27 NOTE — Telephone Encounter (Signed)
Called to confirm appointments per 10/23 secure chat. Left voicemail with appointment details.

## 2023-03-28 ENCOUNTER — Other Ambulatory Visit: Payer: Self-pay

## 2023-03-28 ENCOUNTER — Ambulatory Visit: Payer: PPO | Attending: Family Medicine

## 2023-03-28 DIAGNOSIS — R2681 Unsteadiness on feet: Secondary | ICD-10-CM

## 2023-03-28 DIAGNOSIS — M6281 Muscle weakness (generalized): Secondary | ICD-10-CM

## 2023-03-28 DIAGNOSIS — R293 Abnormal posture: Secondary | ICD-10-CM | POA: Diagnosis not present

## 2023-03-28 DIAGNOSIS — R262 Difficulty in walking, not elsewhere classified: Secondary | ICD-10-CM

## 2023-03-28 DIAGNOSIS — R634 Abnormal weight loss: Secondary | ICD-10-CM | POA: Diagnosis not present

## 2023-03-28 NOTE — Therapy (Signed)
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   Anemia due to antineoplastic chemotherapy 12/24/2018   Arthritis    Atherosclerosis of aorta 05/23/2021   Chronic fatigue syndrome 04/25/2021   Decreased estrogen level 04/25/2021   Family history of breast cancer    Family history of lung cancer    GAD (generalized anxiety disorder) 04/25/2021   Heart murmur    "years ago"   History of adenomatous polyp of colon 04/25/2021   History of endometrial cancer    Insomnia    Major depressive disorder 04/25/2021   Neoplasm of uncertain behavior of skin    Obesity 10/14/2018   Peripheral neuropathy due to chemotherapy 11/20/2018   Personal history of malignant neoplasm of other parts of uterus 04/25/2021   Pneumonia    "long time ago"   Primary localized osteoarthritis of right knee 07/30/2016   Urge incontinence of urine 04/25/2021   Vitamin D deficiency 04/25/2021   Past Surgical History:  Procedure Laterality Date   COLONOSCOPY     EYE SURGERY Bilateral    cataract with lens   IR IMAGING GUIDED PORT INSERTION  11/06/2018   IR REMOVAL TUN ACCESS W/ PORT W/O FL MOD SED  02/13/2019   ROBOTIC ASSISTED TOTAL HYSTERECTOMY WITH BILATERAL SALPINGO OOPHERECTOMY N/A 10/14/2018   Procedure: ROBOTIC ASSISTED TOTAL HYSTERECTOMY WITH BILATERAL SALPINGO OOPHORECTOMY;  Surgeon: Adolphus Birchwood, MD;  Location: [facility];  Service: Gynecology;   Laterality: N/A;   SENTINEL NODE BIOPSY N/A 10/14/2018   Procedure: SENTINEL LYMPH NODE BIOPSY;  Surgeon: Adolphus Birchwood, MD;  Location: [facility];  Service: Gynecology;  Laterality: N/A;   TONSILLECTOMY     TOOTH EXTRACTION Right 08/27/2017   TOTAL KNEE ARTHROPLASTY Right 07/30/2016   Procedure: TOTAL KNEE ARTHROPLASTY;  Surgeon: Salvatore Marvel, MD;  Location: [facility];  Service: Orthopedics;  Laterality: Right;   VEIN LIGATION AND STRIPPING     Patient Active Problem List   Diagnosis Date Noted   Goals of care, counseling/discussion 02/19/2023   Lung mass 02/18/2023   Neoplasm of uncertain behavior of skin    Amnestic MCI (mild cognitive impairment with memory loss) 08/30/2021   Atherosclerosis of aorta 05/23/2021   Major depressive disorder 04/25/2021   Personal history of malignant neoplasm of other parts of uterus 04/25/2021   Urge incontinence of urine 04/25/2021   Vitamin D deficiency 04/25/2021   History of adenomatous polyp of colon 04/25/2021   Decreased estrogen level 04/25/2021   Chronic fatigue syndrome 04/25/2021   GAD (generalized anxiety disorder) 04/25/2021   Anemia due to antineoplastic chemotherapy 12/24/2018   Peripheral neuropathy due to chemotherapy 11/20/2018   Family history of breast cancer    Family history of lung cancer    Obesity 10/14/2018   Primary malignant neoplasm of endometrium (HCC) 10/01/2018   Primary localized  osteoarthritis of right knee 07/30/2016    ONSET DATE: several months  REFERRING DIAG: R26.89 (ICD-10-CM) - Other abnormalities of gait and mobility  THERAPY DIAG: R26.89 (ICD-10-CM) - Other abnormalities of gait and mobility   Rationale for Evaluation and Treatment: Rehabilitation  SUBJECTIVE:                                                                                                                                                                                             SUBJECTIVE STATEMENT: Decreased mobility over past several  months.  Balance is poor and generalized weakness. Remote hx of uterine CA and recently diagnosed with lung CA and is scheduled for consult/treatment.  Notable for SOB and dizziness with arising to feet and limited endurance ambulating household distances with AD and/or furniture walking. Multiple falls Pt accompanied by: significant other  PERTINENT HISTORY: history of dementia Patient with extensive history including primary malignant neoplasm of endometrium, peripheral neuropathy due to chemotherapy, lung mass presents for multiple complaints.  She reports leg weakness and numbness for quite some time.  She also reports increasing cough.  PAIN:  Are you having pain? No  PRECAUTIONS: Fall  RED FLAGS: None   WEIGHT BEARING RESTRICTIONS: No  FALLS: Has patient fallen in last 6 months? Yes. Number of falls multiple  LIVING ENVIRONMENT: Lives with: lives with their spouse Lives in: House/apartment Stairs: No. 6 stairs to enter/exit home. Using HR and spouse assist Has following equipment at home: Single point cane, Walker - 2 wheeled, Wheelchair (manual), shower chair, and Grab bars  PLOF: Independent with basic ADLs, using AD household, w/c for longer distances  PATIENT GOALS: improve balance, strength, and mobility  OBJECTIVE:  Note: Objective measures were completed at Evaluation unless otherwise noted. Vitals: 117/70 mmHg 116 bpm (pt talking during this measurement)  113/72 mmHg, 114 bpm  DIAGNOSTIC FINDINGS: imaging  COGNITION: Overall cognitive status: History of cognitive impairments - at baseline   SENSATION: Peripheral in hands/feet from hx of chemo  COORDINATION: WFL  EDEMA:  none  MUSCLE TONE: WNL    DTRs:  NT  POSTURE: No Significant postural limitations  LOWER EXTREMITY ROM:     WFL  LOWER EXTREMITY MMT:    BLE gross 4/5  BED MOBILITY:  indep  TRANSFERS: Assistive device utilized: None  Sit to stand: Complete Independence and Modified  independence Stand to sit: Complete Independence and Modified independence Chair to chair: Modified independence Floor:  NT   CURB:  Level of Assistance: CGA Assistive device utilized: Environmental consultant - 2 wheeled Curb Comments:   STAIRS: Level of Assistance: CGA Stair Negotiation Technique: Step to Pattern  Alternating Pattern  with Single Rail on Right Number of Stairs: 6  Height of Stairs: 4-6"  Comments:   GAIT: Gait pattern: wide BOS Distance walked:  Assistive device utilized: Environmental consultant - 2 wheeled Level of assistance: SBA Comments:   FUNCTIONAL TESTS:  5 times sit to stand: unable, retro-LOB Berg Balance Scale: 34/56  M-CTSIB  Condition 1: Firm Surface, EO 30 Sec, Mild Sway  Condition 2: Firm Surface, EC 30 Sec, Moderate Sway  Condition 3: Foam Surface, EO 30 Sec, Mild Sway  Condition 4: Foam Surface, EC 8 Sec, Moderate and Severe Sway   : TBD : TBD     TODAY'S TREATMENT:                                                                                                                              DATE: 03/28/23    PATIENT EDUCATION: Education details: assessment details, HEP initiation Person educated: Patient and Spouse Education method: Explanation and Handouts Education comprehension: needs further education  HOME EXERCISE PROGRAM: Access Code: [email or web address] URL: [email or web address] Date: 03/28/2023 Prepared by: Shary Decamp  Exercises - Sit to Stand Without Arm Support  - 7 x weekly - 5 reps  GOALS: Goals reviewed with patient? Yes  SHORT TERM GOALS: Target date: 04/18/2023    Patient will perform HEP with family/caregiver supervision for improved strength, balance, transfers, and gait  Baseline: Goal status: INITIAL  2.  Demo improved balance and BLE strength as evident by 15 sec 5xSTS Baseline: unable Goal status: INITIAL  3.  Improved endurance as evidenced by improved distance of 75 ft during Baseline: TBD Goal status:  INITIAL    LONG TERM GOALS: Target date: 05/09/2023    Manifest low risk for falls per score 45/56 Berg Balance Test Baseline: 34/56 Goal status: INITIAL  2.  Demo improved endurance/gait tolerance as evidenced by 100 ft increase in distance to improve efficiency with community ambulation Baseline: TBD Goal status: INITIAL  3.  Demo improved postural stability per mild sway condition 4 M-CTSIB to improve safety during ADL Baseline: 8 sec mod-severe/LOB Goal status: INITIAL    ASSESSMENT:  CLINICAL IMPRESSION: Patient is a 77 y.o. lady who was seen today for physical therapy evaluation and treatment for abnormalities of gait/mobility.  Exhibits balance and mobility deficits impacting transfers and ambulation with high risk for falls per outcome measures and generalized weakness.  Reduced activity tolerance/endurance requiring frequent rest periods during mobility/ADL and reduced postural awareness/stability per M-CTSIB.  Pt would benefit from PT services to improve mobility and reduce risk for falls   OBJECTIVE IMPAIRMENTS: Abnormal gait, cardiopulmonary status limiting activity, decreased activity tolerance, decreased balance, decreased endurance, decreased mobility, difficulty walking, and decreased strength.   ACTIVITY LIMITATIONS: carrying, lifting, bending, standing, stairs, transfers, and locomotion level  PARTICIPATION LIMITATIONS: meal prep, cleaning, laundry, shopping, community activity, and yard work  PERSONAL FACTORS: Age, Past/current experiences, Time since onset of injury/illness/exacerbation, and 3+ comorbidities: pmh, dementia  are also affecting patient's functional outcome.   REHAB POTENTIAL: Good  CLINICAL DECISION MAKING: Evolving/moderate complexity  EVALUATION COMPLEXITY: Moderate  PLAN:  PT FREQUENCY: 1-2x/week  PT DURATION: 6 weeks  PLANNED INTERVENTIONS: 97110-Therapeutic exercises, 97530- Therapeutic activity, 97112- Neuromuscular  re-education, 97535- Self Care, 91478- Manual therapy, [phone]- Gait training, [phone]- Aquatic Therapy, Patient/Family education, Balance training, Stair training, Taping, Dry Needling, Joint mobilization, Spinal mobilization, Vestibular training, and DME instructions  PLAN FOR NEXT SESSION: and/or , corner balance activities   12:40 PM, 03/28/23 M. Shary Decamp, PT, DPT Physical Therapist- [place] Office Number: [phone]

## 2023-03-29 DIAGNOSIS — G629 Polyneuropathy, unspecified: Secondary | ICD-10-CM | POA: Diagnosis not present

## 2023-03-29 DIAGNOSIS — R918 Other nonspecific abnormal finding of lung field: Secondary | ICD-10-CM | POA: Diagnosis not present

## 2023-03-29 DIAGNOSIS — R053 Chronic cough: Secondary | ICD-10-CM | POA: Diagnosis not present

## 2023-03-29 DIAGNOSIS — G3184 Mild cognitive impairment, so stated: Secondary | ICD-10-CM | POA: Diagnosis not present

## 2023-03-29 DIAGNOSIS — F321 Major depressive disorder, single episode, moderate: Secondary | ICD-10-CM | POA: Diagnosis not present

## 2023-04-01 ENCOUNTER — Inpatient Hospital Stay: Payer: PPO | Admitting: Internal Medicine

## 2023-04-01 ENCOUNTER — Inpatient Hospital Stay: Payer: PPO | Attending: Hematology and Oncology

## 2023-04-01 ENCOUNTER — Encounter: Payer: Self-pay | Admitting: Internal Medicine

## 2023-04-01 VITALS — BP 123/79 | HR 107 | Temp 98.3°F | Resp 16 | Ht 65.0 in | Wt 160.0 lb

## 2023-04-01 DIAGNOSIS — G620 Drug-induced polyneuropathy: Secondary | ICD-10-CM

## 2023-04-01 DIAGNOSIS — Z923 Personal history of irradiation: Secondary | ICD-10-CM | POA: Diagnosis not present

## 2023-04-01 DIAGNOSIS — R634 Abnormal weight loss: Secondary | ICD-10-CM | POA: Insufficient documentation

## 2023-04-01 DIAGNOSIS — Z8049 Family history of malignant neoplasm of other genital organs: Secondary | ICD-10-CM | POA: Diagnosis not present

## 2023-04-01 DIAGNOSIS — Z90722 Acquired absence of ovaries, bilateral: Secondary | ICD-10-CM | POA: Diagnosis not present

## 2023-04-01 DIAGNOSIS — C3490 Malignant neoplasm of unspecified part of unspecified bronchus or lung: Secondary | ICD-10-CM | POA: Insufficient documentation

## 2023-04-01 DIAGNOSIS — Z87891 Personal history of nicotine dependence: Secondary | ICD-10-CM | POA: Insufficient documentation

## 2023-04-01 DIAGNOSIS — Z9221 Personal history of antineoplastic chemotherapy: Secondary | ICD-10-CM | POA: Insufficient documentation

## 2023-04-01 DIAGNOSIS — Z8542 Personal history of malignant neoplasm of other parts of uterus: Secondary | ICD-10-CM

## 2023-04-01 DIAGNOSIS — C3432 Malignant neoplasm of lower lobe, left bronchus or lung: Secondary | ICD-10-CM | POA: Insufficient documentation

## 2023-04-01 DIAGNOSIS — Z803 Family history of malignant neoplasm of breast: Secondary | ICD-10-CM | POA: Diagnosis not present

## 2023-04-01 DIAGNOSIS — Z801 Family history of malignant neoplasm of trachea, bronchus and lung: Secondary | ICD-10-CM

## 2023-04-01 DIAGNOSIS — R918 Other nonspecific abnormal finding of lung field: Secondary | ICD-10-CM

## 2023-04-01 DIAGNOSIS — G629 Polyneuropathy, unspecified: Secondary | ICD-10-CM | POA: Insufficient documentation

## 2023-04-01 DIAGNOSIS — Z90710 Acquired absence of both cervix and uterus: Secondary | ICD-10-CM | POA: Insufficient documentation

## 2023-04-01 LAB — CMP (CANCER CENTER ONLY)
ALT: 16 U/L (ref 0–44)
AST: 21 U/L (ref 15–41)
Albumin: 3.3 g/dL — ABNORMAL LOW (ref 3.5–5.0)
Alkaline Phosphatase: 160 U/L — ABNORMAL HIGH (ref 38–126)
Anion gap: 6 (ref 5–15)
BUN: 12 mg/dL (ref 8–23)
CO2: 29 mmol/L (ref 22–32)
Calcium: 9.2 mg/dL (ref 8.9–10.3)
Chloride: 95 mmol/L — ABNORMAL LOW (ref 98–111)
Creatinine: 0.84 mg/dL (ref 0.44–1.00)
GFR, Estimated: >60 mL/min (ref >60)
Glucose, Bld: 107 mg/dL — ABNORMAL HIGH (ref 70–99)
Potassium: 4.1 mmol/L (ref 3.5–5.1)
Sodium: 130 mmol/L — ABNORMAL LOW (ref 135–145)
Total Bilirubin: 0.4 mg/dL (ref 0.3–1.2)
Total Protein: 6.8 g/dL (ref 6.5–8.1)

## 2023-04-01 LAB — CBC WITH DIFFERENTIAL (CANCER CENTER ONLY)
Abs Immature Granulocytes: 0.09 10*3/uL — ABNORMAL HIGH (ref 0.00–0.07)
Basophils Absolute: 0.1 10*3/uL (ref 0.0–0.1)
Basophils Relative: 1 %
Eosinophils Absolute: 0.3 10*3/uL (ref 0.0–0.5)
Eosinophils Relative: 4 %
HCT: 31 % — ABNORMAL LOW (ref 36.0–46.0)
Hemoglobin: 10 g/dL — ABNORMAL LOW (ref 12.0–15.0)
Immature Granulocytes: 1 %
Lymphocytes Relative: 11 %
Lymphs Abs: 1 10*3/uL (ref 0.7–4.0)
MCH: 28.1 pg (ref 26.0–34.0)
MCHC: 32.3 g/dL (ref 30.0–36.0)
MCV: 87.1 fL (ref 80.0–100.0)
Monocytes Absolute: 0.8 10*3/uL (ref 0.1–1.0)
Monocytes Relative: 9 %
Neutro Abs: 6.6 10*3/uL (ref 1.7–7.7)
Neutrophils Relative %: 74 %
Platelet Count: 450 10*3/uL — ABNORMAL HIGH (ref 150–400)
RBC: 3.56 MIL/uL — ABNORMAL LOW (ref 3.87–5.11)
RDW: 14.8 % (ref 11.5–15.5)
WBC Count: 8.9 10*3/uL (ref 4.0–10.5)
nRBC: 0 % (ref 0.0–0.2)

## 2023-04-01 MED ORDER — PROCHLORPERAZINE MALEATE 10 MG PO TABS: 10.0000 mg | ORAL_TABLET | Freq: Four times a day (QID) | ORAL | 1 refills | Status: DC | PRN

## 2023-04-01 MED ORDER — ONDANSETRON HCL 8 MG PO TABS: 8.0000 mg | ORAL_TABLET | Freq: Three times a day (TID) | ORAL | 1 refills | Status: DC | PRN

## 2023-04-01 NOTE — Progress Notes (Signed)
START OFF PATHWAY REGIMEN - Non-Small Cell Lung   [id number]:Carboplatin AUC=2 IV D1 + Paclitaxel 45 mg/m2 IV D1 q7 Days + RT:   A cycle is every 7 days, concurrent with RT:     Paclitaxel      Carboplatin   **Always confirm dose/schedule in your pharmacy ordering system**  Patient Characteristics: Preoperative or Nonsurgical Candidate (Clinical Staging), Stage III - Nonsurgical Candidate (Nonsquamous and Squamous), PS = 0,1, EGFR Mutation - Common (Exon 19 Deletion or Exon 21 L858R Substitution) Status Unknown Therapeutic Status: Preoperative or Nonsurgical Candidate (Clinical Staging) AJCC T Category: cT4 AJCC N Category: cN2 AJCC M Category: cM0 AJCC 8 Stage Grouping: IIIB ECOG Performance Status: 1 EGFR Mutation - Common (Exon 19 Deletion or Exon 21 L858R Substitution): Awaiting Test Results Intent of Therapy: Curative Intent, Discussed with Patient

## 2023-04-01 NOTE — Progress Notes (Signed)
[facility] [phone]   [phone]  CONSULT NOTE  REFERRING PHYSICIAN: Dr. Levy Pupa  REASON FOR CONSULTATION:  77 years old white female recently diagnosed with lung cancer.  HPI Zoe Kim is a 77 y.o. female with past medical history significant for anemia secondary to prior chemotherapy, chronic fatigue syndrome, generalized anxiety disorder, major depressive disorder, peripheral neuropathy secondary to chemotherapy that was administered in June 2020, history of uterine cancer in 2022 status post total abdominal hysterectomy with bilateral salpingo-oophorectomy, and vitamin D deficiency the patient is a never smoker and she presented to the emergency department on 01/29/2023 complaining of cough as well as shortness of breath and generalized weakness.  Chest x-ray on 01/29/2023 showed large left lung mass measuring 8.4 x 10.9 x 14.5 cm centered in the superior segment of the left lower lobe.  CT scan of the chest with contrast was performed on the same day and that showed a very large soft tissue mass in the left hemithorax measuring up to 10.1 x 7.5 x 11.7 cm centered in the lower lobe and appeared to cross the interlobar fissure.  There was also areas that abuts the pleura and some abnormal left hilar nodes with prominent but not pathologic mediastinal nodes.  The changes were worrisome for neoplasm.  She had MRI of the brain on 01/10/2023 that showed no evidence of acute intracranial abnormalities.  The patient had repeat CT angiogram of the chest on 03/02/2023 that showed no evidence of acute pulmonary embolism but there was interval increase in the size of the left lower lobe lung mass with signs of left hilar involvement and encasement and narrowing of the left lower lobe pulmonary artery and its branches.  There was new filling defect at the bifurcation of the distal left mainstem bronchus identified that may reflect an area of mucous plugging  versus endobronchial tumor.  The patient was seen by her primary oncologist Dr. Emeline Darling such and CT of the abdomen and pelvis on 03/05/2023 showed no evidence of metastatic disease in the abdomen or pelvis.  On March 25, 2023 the patient underwent CT-guided core biopsy of the left lower lobe lung mass by interventional radiology and the final pathology ([id number]) showed moderately differentiated adenocarcinoma compatible with a lung primary. The tumor is positive for cytokeratin 7 and shows focal positivity for pulmonary adeno marker TTF1.  The tumor is negative for Napsin A.  The tumor is also negative for the GYN marker PAX8.  The patient was referred to me today for evaluation and recommendation regarding treatment of her condition. Discussed the use of AI scribe software for clinical note transcription with the patient, who gave verbal consent to proceed.  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 The discovery of the lung mass was incidental, following a severe coughing fit in July 2024, which led to an ER visit. A chest X-ray and subsequent CT scan in August 2024 revealed a 10cm mass in the left lung. The patient reported no symptoms of chest pain or shortness of breath, but the cough persisted, albeit less severe. There was no hemoptysis.  The patient has a history of endometrial cancer in 2022, for which she underwent surgery, chemotherapy, and radiation. She also has atherosclerosis of the aorta, depression, vitamin D deficiency, and peripheral neuropathy, presumably from previous chemotherapy. The patient reported significant weight loss of approximately 30 pounds over the  past four months, attributing it to a loss of appetite.  The patient has a significant family history of cancer, with her mother having had uterine and breast cancer, and a brother with lung cancer. The patient  reported a brief smoking history of two weeks during college and denied regular alcohol or drug use.      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   Anemia due to antineoplastic chemotherapy 12/24/2018   Arthritis    Atherosclerosis of aorta 05/23/2021   Chronic fatigue syndrome 04/25/2021   Decreased estrogen level 04/25/2021   Family history of breast cancer    Family history of lung cancer    GAD (generalized anxiety disorder) 04/25/2021   Heart murmur    "years ago"   History of adenomatous polyp of colon 04/25/2021   History of endometrial cancer    Insomnia    Major depressive disorder 04/25/2021   Neoplasm of uncertain behavior of skin    Obesity 10/14/2018   Peripheral neuropathy due to chemotherapy 11/20/2018   Personal history of malignant neoplasm of other parts of uterus 04/25/2021   Pneumonia    "long time ago"   Primary localized osteoarthritis of right knee 07/30/2016   Urge incontinence of urine 04/25/2021   Vitamin D deficiency 04/25/2021    Past Surgical History:  Procedure Laterality Date   COLONOSCOPY     EYE SURGERY Bilateral    cataract with lens   IR IMAGING GUIDED PORT INSERTION  11/06/2018   IR REMOVAL TUN ACCESS W/ PORT W/O FL MOD SED  02/13/2019   ROBOTIC ASSISTED TOTAL HYSTERECTOMY WITH BILATERAL SALPINGO OOPHERECTOMY N/A 10/14/2018   Procedure: ROBOTIC ASSISTED TOTAL HYSTERECTOMY WITH BILATERAL SALPINGO OOPHORECTOMY;  Surgeon: Adolphus Birchwood, MD;  Location: [facility];  Service: Gynecology;  Laterality: N/A;   SENTINEL NODE BIOPSY N/A 10/14/2018   Procedure: SENTINEL LYMPH NODE BIOPSY;  Surgeon: Adolphus Birchwood, MD;  Location: [facility];  Service: Gynecology;  Laterality: N/A;   TONSILLECTOMY     TOOTH EXTRACTION Right 08/27/2017   TOTAL KNEE ARTHROPLASTY Right 07/30/2016   Procedure: TOTAL KNEE ARTHROPLASTY;  Surgeon: Salvatore Marvel, MD;  Location: [facility];  Service: Orthopedics;   Laterality: Right;   VEIN LIGATION AND STRIPPING      Family History  Problem Relation Age of Onset   Heart disease Mother    COPD Mother    Cancer Mother        uterine (possibly, pt unsure)   Breast cancer Mother        dx in 51s   Dementia Mother        possible mini-strokes   Heart disease Father    COPD Brother    Heart disease Brother    Cancer Brother 69       lung ca, smoker   Cancer Maternal Grandmother        breast dx early 9s   Stroke Maternal Grandfather     Social History Social History   Tobacco Use   Smoking status: Never   Smokeless tobacco: Never   Tobacco comments:    smoked 2 weeks in college  Vaping Use   Vaping status: Never Used  Substance Use Topics   Alcohol use: No   Drug use: No    Allergies  Allergen Reactions   Sulfa Antibiotics Hives    UNSPECIFIED REACTION    Sulfamethoxazole-Trimethoprim Other (See Comments)    Current Outpatient Medications  Medication Sig Dispense Refill   albuterol (VENTOLIN HFA) 108 (90 Base) MCG/ACT inhaler Inhale 2 puffs into the lungs 2 (two) times daily as needed.     Ascorbic Acid (VITAMIN C) 1000 MG tablet Take 1,000 mg by mouth daily.     benzonatate (TESSALON) 100 MG capsule Take 1 capsule (100 mg total) by mouth 3 (three) times daily as needed for cough. 21 capsule 0   buPROPion (WELLBUTRIN XL) 150 MG 24 hr tablet Take 150 mg by mouth every morning.     buPROPion (WELLBUTRIN XL) 300 MG 24 hr tablet 1 tablet in the morning Orally Once a day for 90 days     Cholecalciferol (VITAMIN D) 50 MCG (2000 UT) CAPS Take 2,000 Units by mouth daily.     cyanocobalamin (VITAMIN B12) 500 MCG tablet Take 2,500 mcg by mouth daily.     desvenlafaxine (PRISTIQ) 50 MG 24 hr tablet Take 1 tablet (50 mg total) by mouth daily. 90 tablet 1   donepezil (ARICEPT) 10 MG tablet TAKE   1 TABLET DAILY (Patient taking differently: Take 5 mg by mouth daily. TAKE   1 TABLET DAILY) 30 tablet 11   gabapentin (NEURONTIN) 400 MG  capsule Take 400 mg by mouth 2 (two) times daily. 2 capsules BID     Multiple Vitamins-Iron (MULTIVITAMINS WITH IRON) TABS tablet Take 1 tablet by mouth daily.     NON FORMULARY Take 1 tablet by mouth daily. Dynamic Brain vitamin     oxybutynin (DITROPAN) 5 MG tablet Take 15 mg by mouth daily.     No current facility-administered medications for this visit.    Review of Systems  Constitutional: positive for fatigue Eyes: negative Ears, nose, mouth, throat, and face: negative Respiratory: negative Cardiovascular: negative Gastrointestinal: negative Genitourinary:negative Integument/breast: negative Hematologic/lymphatic: negative Musculoskeletal:positive for arthralgias Neurological: negative Behavioral/Psych: negative Endocrine: negative Allergic/Immunologic: negative  Physical Exam  VQQ:VZDGL, healthy, no distress, well nourished, and well developed SKIN: skin color, texture, turgor are normal, no rashes or significant lesions HEAD: Normocephalic, No masses, lesions, tenderness or abnormalities EYES: normal, PERRLA, Conjunctiva are pink and non-injected EARS: External ears normal, Canals clear OROPHARYNX:no exudate, no erythema, and lips, buccal mucosa, and tongue normal  NECK: supple, no adenopathy, no JVD LYMPH:  no palpable lymphadenopathy, no hepatosplenomegaly BREAST:not examined LUNGS: clear to auscultation , and palpation HEART: regular rate & rhythm, no murmurs, and no gallops ABDOMEN:abdomen soft, non-tender, normal bowel sounds, and no masses or organomegaly BACK: Back symmetric, no curvature., No CVA tenderness EXTREMITIES:no joint deformities, effusion, or inflammation, no edema  NEURO: alert & oriented x 3 with fluent speech, no focal motor/sensory deficits  PERFORMANCE STATUS: ECOG 1  LABORATORY DATA: Lab Results  Component Value Date   WBC 8.9 04/01/2023   HGB 10.0 (L) 04/01/2023   HCT 31.0 (L) 04/01/2023   MCV 87.1 04/01/2023   PLT 450 (H)  04/01/2023      Chemistry      Component Value Date/Time   NA 125 (L) 03/02/2023 0532   NA 137 04/25/2021 0952   K 3.8 03/02/2023 0532   CL 91 (L) 03/02/2023 0532   CO2 22 03/02/2023 0532   BUN 12 03/02/2023 0532   BUN 12 04/25/2021 0952   CREATININE 1.05 (H) 03/02/2023 0532   CREATININE 0.86 04/27/2019 0845   CREATININE 0.86 05/22/2012 1415      Component Value Date/Time   CALCIUM 8.8 (L) 03/02/2023 0532   ALKPHOS 122 01/29/2023 1018   AST 19 01/29/2023 1018  AST 22 04/27/2019 0845   ALT 12 01/29/2023 1018   ALT 18 04/27/2019 0845   BILITOT 0.4 01/29/2023 1018   BILITOT 0.3 04/25/2021 0952   BILITOT 0.6 04/27/2019 0845       RADIOGRAPHIC STUDIES: CT LUNG MASS BIOPSY  Result Date: 03/25/2023 CLINICAL DATA:  Left lower lobe lung mass and history of endometrial carcinoma. EXAM: CT GUIDED CORE BIOPSY OF LEFT LOWER LOBE LUNG MASS ANESTHESIA/SEDATION: Moderate (conscious) sedation was employed during this procedure. A total of Versed 1.5 mg and Fentanyl 75 mcg was administered intravenously. Moderate Sedation Time: 24 minutes. The patient's level of consciousness and vital signs were monitored continuously by radiology nursing throughout the procedure under my direct supervision. PROCEDURE: The procedure risks, benefits, and alternatives were explained to the patient. Questions regarding the procedure were encouraged and answered. The patient understands and consents to the procedure. A time-out was performed prior to initiating the procedure. CT was performed through the chest in a prone position. The left posterior chest wall was prepped with chlorhexidine in a sterile fashion, and a sterile drape was applied covering the operative field. A sterile gown and sterile gloves were used for the procedure. Local anesthesia was provided with 1% Lidocaine. Under CT guidance, a 17 gauge trocar needle was advanced into the posterior aspect of a left lower lobe lung mass. After confirming  needle tip position, 2 separate coaxial 18 gauge core biopsy samples were obtained and submitted in formalin. Gel-Foam pledgets were advanced through the outer needle on completion as well as a BioSentry plug. Additional CT was performed. RADIATION DOSE REDUCTION: This exam was performed according to the departmental dose-optimization program which includes automated exposure control, adjustment of the mA and/or kV according to patient size and/or use of iterative reconstruction technique. COMPLICATIONS: None FINDINGS: Initial CT demonstrates a large rounded mass in the posterior left lower lobe measuring up to 11.4 cm in maximum diameter. Central aspect of the mass is of low density likely consistent with necrosis. The posterior margin of the mass was targeted which demonstrates higher density. Solid core biopsy samples were obtained. Post biopsy imaging demonstrates no pneumothorax or hemothorax. IMPRESSION: CT-guided core biopsy performed of a large left lower lobe lung mass. Electronically Signed   By: Irish Lack M.D.   On: 03/25/2023 11:03   CT ABDOMEN PELVIS W CONTRAST  Result Date: 03/14/2023 CLINICAL DATA:  Uterine cancer, lung mass, suspected metastatic disease EXAM: CT ABDOMEN AND PELVIS WITH CONTRAST TECHNIQUE: Multidetector CT imaging of the abdomen and pelvis was performed using the standard protocol following bolus administration of intravenous contrast. RADIATION DOSE REDUCTION: This exam was performed according to the departmental dose-optimization program which includes automated exposure control, adjustment of the mA and/or kV according to patient size and/or use of iterative reconstruction technique. CONTRAST:  80mL OMNIPAQUE IOHEXOL 300 MG/ML  SOLN COMPARISON:  Partial comparison to CTA chest dated 02/22/2023. CT abdomen/pelvis dated 04/27/2019. FINDINGS: Lower chest: Large left lower lobe mass, incompletely visualized, measuring at least 8.9 cm. Hepatobiliary: Liver is within normal  limits. Gallbladder is unremarkable. No intrahepatic or extrahepatic duct dilatation. Pancreas: Within normal limits. Spleen: Calcified splenic granulomata. Adrenals/Urinary Tract: Adrenal glands are within normal limits. Kidneys are within normal limits.  No hydronephrosis. Bladder is within normal limits. Stomach/Bowel: Stomach is within normal limits. No evidence of bowel obstruction. Normal appendix (series 2/image 56). No colonic wall thickening or inflammatory changes. Vascular/Lymphatic: No evidence of abdominal aortic aneurysm. Atherosclerotic calcifications of the abdominal aorta and branch vessels, although  vessels remain patent. No suspicious abdominopelvic lymphadenopathy. Reproductive: Status post hysterectomy. No adnexal masses. Other: No abdominopelvic ascites. Musculoskeletal: Degenerative changes of the visualized thoracolumbar spine. IMPRESSION: Status post hysterectomy. No evidence of metastatic disease in the abdomen/pelvis. Large left lower lobe mass, incompletely visualized, better evaluated on recent CTA chest. Electronically Signed   By: Charline Bills M.D.   On: 03/14/2023 02:13    ASSESSMENT: This is a very pleasant 77 years old never smoker white female recently diagnosed with stage IIIb (T4, N2, M0) non-small cell lung cancer, adenocarcinoma presented with large left lower lobe lung mass in addition to left hilar and mediastinal lymphadenopathy diagnosed in October 2024. Molecular studies and PD-L1 expression are still pending.   PLAN: I had a lengthy discussion with the patient and her husband today about her current disease stage, prognosis and treatment options. I personally and independently reviewed her imaging studies and I discussed the result with the patient and her husband. Assessment and Plan    Non-Small Cell Lung Cancer (Adenocarcinoma) Newly diagnosed with a large (10cm) mass in the left lower lobe. The mass is causing cough and is at risk of closing the left  airway. No evidence of metastasis on CT scan. Awaiting results of molecular markers and PD-L1 expression. -Order PET scan to confirm no distant metastasis. -Refer to radiation oncology for planning and initiation of radiation therapy. -Plan to start weekly Carboplatin and Paclitaxel chemotherapy in conjunction with radiation therapy. -After completion of chemo-radiation, plan to start immunotherapy if no evidence of molecular mutation. -Monitor closely for side effects of chemotherapy including neuropathy, myelosuppression, nausea, vomiting, and fatigue.  History of Endometrial Cancer Treated with surgery, chemotherapy, and radiation in 2022. No current issues reported. -Continue routine follow-up as per oncology.  Weight Loss Reported significant weight loss over the past 4 months. -Monitor closely during cancer treatment. Consider dietary consultation if weight loss continues.  Peripheral Neuropathy History of neuropathy likely secondary to previous chemotherapy. -Continue Gabapentin as needed. Monitor for potential exacerbation with new chemotherapy regimen.     The patient was advised to call immediately if she has any other concerning symptoms in the interval. The patient voices understanding of current disease status and treatment options and is in agreement with the current care plan.  All questions were answered. The patient knows to call the clinic with any problems, questions or concerns. We can certainly see the patient much sooner if necessary.  Thank you so much for allowing me to participate in the care of Zoe Kim. I will continue to follow up the patient with you and assist in her care.  The total time spent in the appointment was 90 minutes.  Disclaimer: This note was dictated with voice recognition software. Similar sounding words can inadvertently be transcribed and may not be corrected upon review.   Lajuana Matte April 01, 2023, 2:19 PM

## 2023-04-01 NOTE — Progress Notes (Signed)
Request for pts tissue be sent to [facility] for molecular studies emailed to Lita Mains, [facility] pathology technician.

## 2023-04-01 NOTE — Progress Notes (Signed)
I met with the pt face to face today at her medical oncology consult with Dr. Arbutus Ped. Pt was accompanied by her husband, Zoe Kim. I introduced myself and explained my role as a nurse navigator as part of her cancer care.  The plan for the pt is for her tissue to be sent to [facility] for molecular testing and for the pt to get a PET scan. I let the pt know I would schedule the PET scan for them and call them with the details once the appt has been scheduled.  I also let them know I would be making a follow up appt with Dr Arbutus Ped once we've gotten the results from the PET scan to decided a treatment plan.  I provided my card to the pt's husband. All questions answered.

## 2023-04-02 ENCOUNTER — Other Ambulatory Visit: Payer: Self-pay

## 2023-04-02 ENCOUNTER — Telehealth: Payer: Self-pay | Admitting: Radiation Oncology

## 2023-04-02 ENCOUNTER — Telehealth: Payer: Self-pay | Admitting: Internal Medicine

## 2023-04-02 ENCOUNTER — Ambulatory Visit: Payer: PPO | Admitting: Hematology and Oncology

## 2023-04-02 NOTE — Telephone Encounter (Signed)
Left message for patient to call back to schedule consult per 10/28 referral.

## 2023-04-03 ENCOUNTER — Ambulatory Visit: Payer: PPO

## 2023-04-03 ENCOUNTER — Encounter (HOSPITAL_COMMUNITY)
Admission: RE | Admit: 2023-04-03 | Discharge: 2023-04-03 | Disposition: A | Discharge status: Home / Self Care | Payer: PPO | Source: Ambulatory Visit | Attending: Internal Medicine | Admitting: Internal Medicine

## 2023-04-03 DIAGNOSIS — C3490 Malignant neoplasm of unspecified part of unspecified bronchus or lung: Secondary | ICD-10-CM | POA: Diagnosis not present

## 2023-04-03 DIAGNOSIS — R293 Abnormal posture: Secondary | ICD-10-CM

## 2023-04-03 DIAGNOSIS — R262 Difficulty in walking, not elsewhere classified: Secondary | ICD-10-CM

## 2023-04-03 DIAGNOSIS — R2681 Unsteadiness on feet: Secondary | ICD-10-CM

## 2023-04-03 DIAGNOSIS — M6281 Muscle weakness (generalized): Secondary | ICD-10-CM

## 2023-04-03 DIAGNOSIS — C55 Malignant neoplasm of uterus, part unspecified: Secondary | ICD-10-CM | POA: Diagnosis not present

## 2023-04-03 LAB — GLUCOSE, CAPILLARY: Glucose-Capillary: 113 mg/dL — ABNORMAL HIGH (ref 70–99)

## 2023-04-03 MED ORDER — FLUDEOXYGLUCOSE F - 18 (FDG) INJECTION
7.9800 | Freq: Once | INTRAVENOUS | Status: AC
  Administered 2023-04-03: 7.98 via INTRAVENOUS

## 2023-04-03 NOTE — Therapy (Signed)
OUTPATIENT PHYSICAL THERAPY NEURO TREATMENT   Patient Name: Zoe Kim MRN: [id number] DOB:1945/12/16, 77 y.o., female Today's Date: 04/03/2023   PCP: Garth Bigness, MD REFERRING PROVIDER: Camie Patience, FNP  END OF SESSION:  PT End of Session - 04/03/23 0853     Visit Number 2    Number of Visits 13    Date for PT Re-Evaluation 05/09/23    Authorization Type [employer]    PT Start Time 0850    PT Stop Time 0930    PT Time Calculation (min) 40 min             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   Anemia due to antineoplastic chemotherapy 12/24/2018   Arthritis    Atherosclerosis of aorta 05/23/2021   Chronic fatigue syndrome 04/25/2021   Decreased estrogen level 04/25/2021   Family history of breast cancer    Family history of lung cancer    GAD (generalized anxiety disorder) 04/25/2021   Heart murmur    "years ago"   History of adenomatous polyp of colon 04/25/2021   History of endometrial cancer    Insomnia    Major depressive disorder 04/25/2021   Neoplasm of uncertain behavior of skin    Obesity 10/14/2018   Peripheral neuropathy due to chemotherapy 11/20/2018   Personal history of malignant neoplasm of other parts of uterus 04/25/2021   Pneumonia    "long time ago"   Primary localized osteoarthritis of right knee 07/30/2016   Urge incontinence of urine 04/25/2021   Vitamin D deficiency 04/25/2021   Past Surgical History:  Procedure Laterality Date   COLONOSCOPY     EYE SURGERY Bilateral    cataract with lens   IR IMAGING GUIDED PORT INSERTION  11/06/2018   IR REMOVAL TUN ACCESS W/ PORT W/O FL MOD SED  02/13/2019   ROBOTIC ASSISTED TOTAL HYSTERECTOMY WITH BILATERAL SALPINGO OOPHERECTOMY N/A 10/14/2018   Procedure: ROBOTIC ASSISTED TOTAL HYSTERECTOMY WITH BILATERAL SALPINGO OOPHORECTOMY;  Surgeon: Adolphus Birchwood, MD;  Location: [facility];  Service: Gynecology;   Laterality: N/A;   SENTINEL NODE BIOPSY N/A 10/14/2018   Procedure: SENTINEL LYMPH NODE BIOPSY;  Surgeon: Adolphus Birchwood, MD;  Location: [facility];  Service: Gynecology;  Laterality: N/A;   TONSILLECTOMY     TOOTH EXTRACTION Right 08/27/2017   TOTAL KNEE ARTHROPLASTY Right 07/30/2016   Procedure: TOTAL KNEE ARTHROPLASTY;  Surgeon: Salvatore Marvel, MD;  Location: [facility];  Service: Orthopedics;  Laterality: Right;   VEIN LIGATION AND STRIPPING     Patient Active Problem List   Diagnosis Date Noted   Primary adenocarcinoma of lower lobe of left lung (HCC) 04/01/2023   Malignant neoplasm of unspecified part of unspecified bronchus or lung (HCC) 04/01/2023   Goals of care, counseling/discussion 02/19/2023   Lung mass 02/18/2023   Neoplasm of uncertain behavior of skin    Amnestic MCI (mild cognitive impairment with memory loss) 08/30/2021   Atherosclerosis of aorta 05/23/2021   Major depressive disorder 04/25/2021   Personal history of malignant neoplasm of other parts of uterus 04/25/2021   Urge incontinence of urine 04/25/2021   Vitamin D deficiency 04/25/2021   History of adenomatous polyp of colon 04/25/2021   Decreased estrogen level 04/25/2021   Chronic fatigue syndrome 04/25/2021   GAD (generalized anxiety disorder) 04/25/2021   Anemia due to antineoplastic chemotherapy 12/24/2018   Peripheral neuropathy due to chemotherapy 11/20/2018   Family history of breast cancer  Family history of lung cancer    Obesity 10/14/2018   Primary malignant neoplasm of endometrium (HCC) 10/01/2018   Primary localized osteoarthritis of right knee 07/30/2016    ONSET DATE: several months  REFERRING DIAG: R26.89 (ICD-10-CM) - Other abnormalities of gait and mobility  THERAPY DIAG: R26.89 (ICD-10-CM) - Other abnormalities of gait and mobility   Rationale for Evaluation and Treatment: Rehabilitation  SUBJECTIVE:                                                                                                                                                                                              SUBJECTIVE STATEMENT: No pain, just tickly stuff in legs, no falls.  Pt accompanied by: significant other  PERTINENT HISTORY: history of dementia Patient with extensive history including primary malignant neoplasm of endometrium, peripheral neuropathy due to chemotherapy, lung mass presents for multiple complaints.  She reports leg weakness and numbness for quite some time.  She also reports increasing cough.  PAIN:  Are you having pain? No  PRECAUTIONS: Fall  RED FLAGS: None   WEIGHT BEARING RESTRICTIONS: No  FALLS: Has patient fallen in last 6 months? Yes. Number of falls multiple  LIVING ENVIRONMENT: Lives with: lives with their spouse Lives in: House/apartment Stairs: No. 6 stairs to enter/exit home. Using HR and spouse assist Has following equipment at home: Single point cane, Walker - 2 wheeled, Wheelchair (manual), shower chair, and Grab bars  PLOF: Independent with basic ADLs, using AD household, w/c for longer distances  PATIENT GOALS: improve balance, strength, and mobility  OBJECTIVE:   TODAY'S TREATMENT: 04/03/23 Activity Comments  : pretest 97%, 115 bpm W/ cane: x 250 ft 97%, 127 bpm. 2 ft/sec = 1.3 mph  Corner balance -for HEP development: see below. In order to improve multisensory balance and postural stability  Sit to stand 3x5 30 sec rest btwn sets  Retro-walking cga-min A x 45 ft Loss of postural stability at the end requiring seated rest period to recover  Gait w/ CGA and cane at end of session x 2 min        PATIENT EDUCATION: Education details: assessment details, HEP initiation Person educated: Patient and Spouse Education method: Explanation and Handouts Education comprehension: needs further education  HOME EXERCISE PROGRAM: Access Code: [email or web address] URL: [email or web address] Date: 03/28/2023 Prepared by: Shary Decamp  Exercises - Sit to Stand Without Arm Support  - 7 x weekly - 5 reps - Corner Balance Feet Together With Eyes Open  - 1 x daily - 7 x weekly - 3 sets - 30 sec hold - Corner Balance Feet Together With Eyes  Closed  - 1 x daily - 7 x weekly - 3 sets - 30 sec hold - Corner Balance Feet Together: Eyes Open With Head Turns  - 1 x daily - 7 x weekly - 3 sets - 3 reps - Corner Balance Feet Together: Eyes Closed With Head Turns  - 1 x daily - 7 x weekly - 3 sets - 3 reps - Semi-Tandem Corner Balance With Eyes Open  - 1 x daily - 7 x weekly - 3 sets - 15-30 sec hold  Note: Objective measures were completed at Evaluation unless otherwise noted.  Vitals: 117/70 mmHg 116 bpm (pt talking during this measurement)  113/72 mmHg, 114 bpm  DIAGNOSTIC FINDINGS: imaging  COGNITION: Overall cognitive status: History of cognitive impairments - at baseline   SENSATION: Peripheral in hands/feet from hx of chemo  COORDINATION: WFL  EDEMA:  none  MUSCLE TONE: WNL    DTRs:  NT  POSTURE: No Significant postural limitations  LOWER EXTREMITY ROM:     WFL  LOWER EXTREMITY MMT:    BLE gross 4/5  BED MOBILITY:  indep  TRANSFERS: Assistive device utilized: None  Sit to stand: Complete Independence and Modified independence Stand to sit: Complete Independence and Modified independence Chair to chair: Modified independence Floor:  NT   CURB:  Level of Assistance: CGA Assistive device utilized: Environmental consultant - 2 wheeled Curb Comments:   STAIRS: Level of Assistance: CGA Stair Negotiation Technique: Step to Pattern Alternating Pattern  with Single Rail on Right Number of Stairs: 6  Height of Stairs: 4-6"  Comments:   GAIT: Gait pattern: wide BOS Distance walked:  Assistive device utilized: Environmental consultant - 2 wheeled Level of assistance: SBA Comments:   FUNCTIONAL TESTS:  5 times sit to stand: unable, retro-LOB Berg Balance Scale: 34/56  M-CTSIB  Condition 1: Firm Surface, EO 30 Sec,  Mild Sway  Condition 2: Firm Surface, EC 30 Sec, Moderate Sway  Condition 3: Foam Surface, EO 30 Sec, Mild Sway  Condition 4: Foam Surface, EC 8 Sec, Moderate and Severe Sway   : TBD : TBD         GOALS: Goals reviewed with patient? Yes  SHORT TERM GOALS: Target date: 04/18/2023    Patient will perform HEP with family/caregiver supervision for improved strength, balance, transfers, and gait  Baseline: Goal status: INITIAL  2.  Demo improved balance and BLE strength as evident by 15 sec 5xSTS Baseline: unable Goal status: INITIAL  3.  Improved endurance as evidenced by improved distance of 75 ft during Baseline: 250 ft w/ cane, 127 bpm post-test Goal status: INITIAL    LONG TERM GOALS: Target date: 05/09/2023    Manifest low risk for falls per score 45/56 Berg Balance Test Baseline: 34/56 Goal status: INITIAL  2.  Demo improved endurance/gait tolerance as evidenced by 100 ft increase in distance to improve efficiency with community ambulation Baseline: TBD Goal status: INITIAL  3.  Demo improved postural stability per mild sway condition 4 M-CTSIB to improve safety during ADL Baseline: 8 sec mod-severe/LOB Goal status: INITIAL    ASSESSMENT:  CLINICAL IMPRESSION: reveals reduced gait speed and endurance traveling 250 ft below age-matched average.  Addressed balance deficits initiating corner balance activities for safety in approaching multisensory balance to improve postural stability and proprioceptive awareness.  Frequent LOB with eyes closed and head movement conditions.  Trial of retro-walking with onset of fatigue and loss of postural control requiring seated break to recover. Ended with 2  minute walking to improve gait endurance. Continued sessions to progress POC details and reduce risk for falls  OBJECTIVE IMPAIRMENTS: Abnormal gait, cardiopulmonary status limiting activity, decreased activity tolerance, decreased balance,  decreased endurance, decreased mobility, difficulty walking, and decreased strength.   ACTIVITY LIMITATIONS: carrying, lifting, bending, standing, stairs, transfers, and locomotion level  PARTICIPATION LIMITATIONS: meal prep, cleaning, laundry, shopping, community activity, and yard work  PERSONAL FACTORS: Age, Past/current experiences, Time since onset of injury/illness/exacerbation, and 3+ comorbidities: pmh, dementia  are also affecting patient's functional outcome.   REHAB POTENTIAL: Good  CLINICAL DECISION MAKING: Evolving/moderate complexity  EVALUATION COMPLEXITY: Moderate  PLAN:  PT FREQUENCY: 1-2x/week  PT DURATION: 6 weeks  PLANNED INTERVENTIONS: 97110-Therapeutic exercises, 97530- Therapeutic activity, 97112- Neuromuscular re-education, 97535- Self Care, 16109- Manual therapy, [phone]- Gait training, [phone]- Aquatic Therapy, Patient/Family education, Balance training, Stair training, Taping, Dry Needling, Joint mobilization, Spinal mobilization, Vestibular training, and DME instructions  PLAN FOR NEXT SESSION: HEP review   8:53 AM, 04/03/23 M. Shary Decamp, PT, DPT Physical Therapist- [place] Office Number: [phone]

## 2023-04-04 ENCOUNTER — Other Ambulatory Visit: Payer: Self-pay

## 2023-04-04 ENCOUNTER — Encounter: Payer: Self-pay | Admitting: Internal Medicine

## 2023-04-04 NOTE — Progress Notes (Signed)
I reached out to the pt and her husband, Jonny Ruiz, to let them know about the PET scan that the pt had this afternoon at 2:30 as I wasn't able to do so on 10/29. I confirmed with Jonny Ruiz that the pt had not had any food and water only this morning, and John confirmed that he had "just taken a bowl of cereal away from her" I asked if she had eaten any and he said she hadn't. However, Jonny Ruiz did say that she had had several cough drops. I told him I would reach out to [facility] to see if that would be a problem. I spoke to Jan at [facility]. She told me that as long as she hadn't had any after 10am, she was fine. I called John back immediately after to ask if he knew the last time she had one, and if it had been before 10am. John states he was "pretty sure" that it was before 10. I asked him to not let her have anymore. I called Jan at [facility] back and she said as long as she doesn't have anymore, it will be fine.

## 2023-04-05 ENCOUNTER — Ambulatory Visit: Payer: PPO | Admitting: Physical Therapy

## 2023-04-05 ENCOUNTER — Encounter: Payer: Self-pay | Admitting: Internal Medicine

## 2023-04-05 ENCOUNTER — Inpatient Hospital Stay: Payer: PPO | Attending: Hematology and Oncology

## 2023-04-05 DIAGNOSIS — Z803 Family history of malignant neoplasm of breast: Secondary | ICD-10-CM | POA: Insufficient documentation

## 2023-04-05 DIAGNOSIS — C7971 Secondary malignant neoplasm of right adrenal gland: Secondary | ICD-10-CM | POA: Insufficient documentation

## 2023-04-05 DIAGNOSIS — Z5111 Encounter for antineoplastic chemotherapy: Secondary | ICD-10-CM | POA: Insufficient documentation

## 2023-04-05 DIAGNOSIS — Z801 Family history of malignant neoplasm of trachea, bronchus and lung: Secondary | ICD-10-CM | POA: Insufficient documentation

## 2023-04-05 DIAGNOSIS — C779 Secondary and unspecified malignant neoplasm of lymph node, unspecified: Secondary | ICD-10-CM | POA: Insufficient documentation

## 2023-04-05 DIAGNOSIS — C3432 Malignant neoplasm of lower lobe, left bronchus or lung: Secondary | ICD-10-CM | POA: Insufficient documentation

## 2023-04-05 NOTE — Progress Notes (Signed)
The proposed treatment discussed in conference is for discussion purpose only and is not a binding recommendation.  The patients have not been physically examined, or presented with their treatment options.  Therefore, final treatment plans cannot be decided.  

## 2023-04-08 ENCOUNTER — Ambulatory Visit: Payer: PPO | Admitting: Physical Therapy

## 2023-04-08 DIAGNOSIS — R296 Repeated falls: Secondary | ICD-10-CM | POA: Diagnosis not present

## 2023-04-08 DIAGNOSIS — G9332 Myalgic encephalomyelitis/chronic fatigue syndrome: Secondary | ICD-10-CM | POA: Diagnosis not present

## 2023-04-09 ENCOUNTER — Ambulatory Visit: Payer: PPO | Admitting: Physician Assistant

## 2023-04-09 ENCOUNTER — Inpatient Hospital Stay: Payer: PPO

## 2023-04-09 ENCOUNTER — Other Ambulatory Visit: Payer: Self-pay | Admitting: Medical Oncology

## 2023-04-09 VITALS — BP 117/71 | HR 84 | Temp 97.9°F | Resp 16 | Wt 158.8 lb

## 2023-04-09 DIAGNOSIS — C3490 Malignant neoplasm of unspecified part of unspecified bronchus or lung: Secondary | ICD-10-CM

## 2023-04-09 DIAGNOSIS — R630 Anorexia: Secondary | ICD-10-CM

## 2023-04-09 DIAGNOSIS — Z801 Family history of malignant neoplasm of trachea, bronchus and lung: Secondary | ICD-10-CM | POA: Diagnosis not present

## 2023-04-09 DIAGNOSIS — C3432 Malignant neoplasm of lower lobe, left bronchus or lung: Secondary | ICD-10-CM | POA: Diagnosis not present

## 2023-04-09 DIAGNOSIS — Z803 Family history of malignant neoplasm of breast: Secondary | ICD-10-CM | POA: Diagnosis not present

## 2023-04-09 DIAGNOSIS — C779 Secondary and unspecified malignant neoplasm of lymph node, unspecified: Secondary | ICD-10-CM | POA: Diagnosis not present

## 2023-04-09 DIAGNOSIS — Z5111 Encounter for antineoplastic chemotherapy: Secondary | ICD-10-CM | POA: Diagnosis not present

## 2023-04-09 DIAGNOSIS — C7971 Secondary malignant neoplasm of right adrenal gland: Secondary | ICD-10-CM | POA: Diagnosis not present

## 2023-04-09 LAB — CMP (CANCER CENTER ONLY)
ALT: 17 U/L (ref 0–44)
AST: 24 U/L (ref 15–41)
Albumin: 3.4 g/dL — ABNORMAL LOW (ref 3.5–5.0)
Alkaline Phosphatase: 135 U/L — ABNORMAL HIGH (ref 38–126)
Anion gap: 8 (ref 5–15)
BUN: 10 mg/dL (ref 8–23)
CO2: 28 mmol/L (ref 22–32)
Calcium: 9.1 mg/dL (ref 8.9–10.3)
Chloride: 92 mmol/L — ABNORMAL LOW (ref 98–111)
Creatinine: 0.87 mg/dL (ref 0.44–1.00)
GFR, Estimated: >60 mL/min (ref >60)
Glucose, Bld: 124 mg/dL — ABNORMAL HIGH (ref 70–99)
Potassium: 3.8 mmol/L (ref 3.5–5.1)
Sodium: 128 mmol/L — ABNORMAL LOW (ref 135–145)
Total Bilirubin: 0.5 mg/dL (ref <1.2)
Total Protein: 7.1 g/dL (ref 6.5–8.1)

## 2023-04-09 LAB — CBC WITH DIFFERENTIAL (CANCER CENTER ONLY)
Abs Immature Granulocytes: 0.06 10*3/uL (ref 0.00–0.07)
Basophils Absolute: 0.1 10*3/uL (ref 0.0–0.1)
Basophils Relative: 1 %
Eosinophils Absolute: 0.2 10*3/uL (ref 0.0–0.5)
Eosinophils Relative: 2 %
HCT: 33.2 % — ABNORMAL LOW (ref 36.0–46.0)
Hemoglobin: 10.8 g/dL — ABNORMAL LOW (ref 12.0–15.0)
Immature Granulocytes: 1 %
Lymphocytes Relative: 10 %
Lymphs Abs: 0.8 10*3/uL (ref 0.7–4.0)
MCH: 28.1 pg (ref 26.0–34.0)
MCHC: 32.5 g/dL (ref 30.0–36.0)
MCV: 86.5 fL (ref 80.0–100.0)
Monocytes Absolute: 0.6 10*3/uL (ref 0.1–1.0)
Monocytes Relative: 8 %
Neutro Abs: 6.7 10*3/uL (ref 1.7–7.7)
Neutrophils Relative %: 78 %
Platelet Count: 440 10*3/uL — ABNORMAL HIGH (ref 150–400)
RBC: 3.84 MIL/uL — ABNORMAL LOW (ref 3.87–5.11)
RDW: 14.7 % (ref 11.5–15.5)
WBC Count: 8.5 10*3/uL (ref 4.0–10.5)
nRBC: 0 % (ref 0.0–0.2)

## 2023-04-09 MED ORDER — PALONOSETRON HCL INJECTION 0.25 MG/5ML
0.2500 mg | Freq: Once | INTRAVENOUS | Status: AC
  Administered 2023-04-09: 0.25 mg via INTRAVENOUS
  Filled 2023-04-09: qty 5

## 2023-04-09 MED ORDER — DIPHENHYDRAMINE HCL 50 MG/ML IJ SOLN
50.0000 mg | Freq: Once | INTRAMUSCULAR | Status: AC
  Administered 2023-04-09: 50 mg via INTRAVENOUS
  Filled 2023-04-09: qty 1

## 2023-04-09 MED ORDER — CARBOPLATIN CHEMO INJECTION 450 MG/45ML
160.0000 mg | Freq: Once | INTRAVENOUS | Status: AC
  Administered 2023-04-09: 160 mg via INTRAVENOUS
  Filled 2023-04-09: qty 16

## 2023-04-09 MED ORDER — SODIUM CHLORIDE 0.9 % IV SOLN
INTRAVENOUS | Status: DC
Start: 2023-04-09 — End: 2023-04-09

## 2023-04-09 MED ORDER — DEXAMETHASONE SODIUM PHOSPHATE 10 MG/ML IJ SOLN
10.0000 mg | Freq: Once | INTRAMUSCULAR | Status: AC
  Administered 2023-04-09: 10 mg via INTRAVENOUS
  Filled 2023-04-09: qty 1

## 2023-04-09 MED ORDER — PACLITAXEL CHEMO INJECTION 300 MG/50ML
45.0000 mg/m2 | Freq: Once | INTRAVENOUS | Status: AC
  Administered 2023-04-09: 84 mg via INTRAVENOUS
  Filled 2023-04-09: qty 14

## 2023-04-09 MED ORDER — FAMOTIDINE IN NACL 20-0.9 MG/50ML-% IV SOLN
20.0000 mg | Freq: Once | INTRAVENOUS | Status: AC
  Administered 2023-04-09: 20 mg via INTRAVENOUS
  Filled 2023-04-09: qty 50

## 2023-04-09 NOTE — Patient Instructions (Signed)
[facility] - A DEPT OF [facility]  Discharge Instructions: Thank you for choosing [facility] to provide your oncology and hematology care.   If you have a lab appointment with the Cancer Center, please go directly to the Cancer Center and check in at the registration area.   Wear comfortable clothing and clothing appropriate for easy access to any Portacath or PICC line.   We strive to give you quality time with your provider. You may need to reschedule your appointment if you arrive late (15 or more minutes).  Arriving late affects you and other patients whose appointments are after yours.  Also, if you miss three or more appointments without notifying the office, you may be dismissed from the clinic at the provider's discretion.      For prescription refill requests, have your pharmacy contact our office and allow 72 hours for refills to be completed.    Today you received the following chemotherapy and/or immunotherapy agents: Paclitaxel (Taxol) and Carboplatin.      To help prevent nausea and vomiting after your treatment, we encourage you to take your nausea medication as directed.  BELOW ARE SYMPTOMS THAT SHOULD BE REPORTED IMMEDIATELY: *FEVER GREATER THAN 100.4 F (38 C) OR HIGHER *CHILLS OR SWEATING *NAUSEA AND VOMITING THAT IS NOT CONTROLLED WITH YOUR NAUSEA MEDICATION *UNUSUAL SHORTNESS OF BREATH *UNUSUAL BRUISING OR BLEEDING *URINARY PROBLEMS (pain or burning when urinating, or frequent urination) *BOWEL PROBLEMS (unusual diarrhea, constipation, pain near the anus) TENDERNESS IN MOUTH AND THROAT WITH OR WITHOUT PRESENCE OF ULCERS (sore throat, sores in mouth, or a toothache) UNUSUAL RASH, SWELLING OR PAIN  UNUSUAL VAGINAL DISCHARGE OR ITCHING   Items with * indicate a potential emergency and should be followed up as soon as possible or go to the Emergency Department if any problems should occur.  Please show the CHEMOTHERAPY  ALERT CARD or IMMUNOTHERAPY ALERT CARD at check-in to the Emergency Department and triage nurse.  Should you have questions after your visit or need to cancel or reschedule your appointment, please contact [facility] - A DEPT OF [facility]  Dept: [phone]  and follow the prompts.  Office hours are 8:00 a.m. to 4:30 p.m. Monday - Friday. Please note that voicemails left after 4:00 p.m. may not be returned until the following business day.  We are closed weekends and major holidays. You have access to a nurse at all times for urgent questions. Please call the main number to the clinic Dept: [phone] and follow the prompts.   For any non-urgent questions, you may also contact your provider using MyChart. We now offer e-Visits for anyone 59 and older to request care online for non-urgent symptoms. For details visit [email or web address].   Also download the MyChart app! Go to the app store, search "MyChart", open the app, select [facility], and log in with your MyChart username and password.  Paclitaxel Injection What is this medication? PACLITAXEL (PAK li TAX el) treats some types of cancer. It works by slowing down the growth of cancer cells. This medicine may be used for other purposes; ask your health care provider or pharmacist if you have questions. COMMON BRAND NAME(S): Onxol, Taxol What should I tell my care team before I take this medication? They need to know if you have any of these conditions: Heart disease Liver disease Low white blood cell levels An unusual or allergic reaction to paclitaxel, other medications, foods, dyes,  or preservatives If you or your partner are pregnant or trying to get pregnant Breast-feeding How should I use this medication? This medication is injected into a vein. It is given by your care team in a hospital or clinic setting. Talk to your care team about the use of this medication in children. While it may be  given to children for selected conditions, precautions do apply. Overdosage: If you think you have taken too much of this medicine contact a poison control center or emergency room at once. NOTE: This medicine is only for you. Do not share this medicine with others. What if I miss a dose? Keep appointments for follow-up doses. It is important not to miss your dose. Call your care team if you are unable to keep an appointment. What may interact with this medication? Do not take this medication with any of the following: Live virus vaccines Other medications may affect the way this medication works. Talk with your care team about all of the medications you take. They may suggest changes to your treatment plan to lower the risk of side effects and to make sure your medications work as intended. This list may not describe all possible interactions. Give your health care provider a list of all the medicines, herbs, non-prescription drugs, or dietary supplements you use. Also tell them if you smoke, drink alcohol, or use illegal drugs. Some items may interact with your medicine. What should I watch for while using this medication? Your condition will be monitored carefully while you are receiving this medication. You may need blood work while taking this medication. This medication may make you feel generally unwell. This is not uncommon as chemotherapy can affect healthy cells as well as cancer cells. Report any side effects. Continue your course of treatment even though you feel ill unless your care team tells you to stop. This medication can cause serious allergic reactions. To reduce the risk, your care team may give you other medications to take before receiving this one. Be sure to follow the directions from your care team. This medication may increase your risk of getting an infection. Call your care team for advice if you get a fever, chills, sore throat, or other symptoms of a cold or flu. Do not  treat yourself. Try to avoid being around people who are sick. This medication may increase your risk to bruise or bleed. Call your care team if you notice any unusual bleeding. Be careful brushing or flossing your teeth or using a toothpick because you may get an infection or bleed more easily. If you have any dental work done, tell your dentist you are receiving this medication. Talk to your care team if you may be pregnant. Serious birth defects can occur if you take this medication during pregnancy. Talk to your care team before breastfeeding. Changes to your treatment plan may be needed. What side effects may I notice from receiving this medication? Side effects that you should report to your care team as soon as possible: Allergic reactions--skin rash, itching, hives, swelling of the face, lips, tongue, or throat Heart rhythm changes--fast or irregular heartbeat, dizziness, feeling faint or lightheaded, chest pain, trouble breathing Increase in blood pressure Infection--fever, chills, cough, sore throat, wounds that don't heal, pain or trouble when passing urine, general feeling of discomfort or being unwell Low blood pressure--dizziness, feeling faint or lightheaded, blurry vision Low red blood cell level--unusual weakness or fatigue, dizziness, headache, trouble breathing Painful swelling, warmth, or redness of the  skin, blisters or sores at the infusion site Pain, tingling, or numbness in the hands or feet Slow heartbeat--dizziness, feeling faint or lightheaded, confusion, trouble breathing, unusual weakness or fatigue Unusual bruising or bleeding Side effects that usually do not require medical attention (report to your care team if they continue or are bothersome): Diarrhea Hair loss Joint pain Loss of appetite Muscle pain Nausea Vomiting This list may not describe all possible side effects. Call your doctor for medical advice about side effects. You may report side effects to FDA  at 1-800-FDA-1088. Where should I keep my medication? This medication is given in a hospital or clinic. It will not be stored at home. NOTE: This sheet is a summary. It may not cover all possible information. If you have questions about this medicine, talk to your doctor, pharmacist, or health care provider.  2024 Elsevier/Gold Standard (2021-10-10 00:00:00) Carboplatin Injection What is this medication? CARBOPLATIN (KAR boe pla tin) treats some types of cancer. It works by slowing down the growth of cancer cells. This medicine may be used for other purposes; ask your health care provider or pharmacist if you have questions. COMMON BRAND NAME(S): Paraplatin What should I tell my care team before I take this medication? They need to know if you have any of these conditions: Blood disorders Hearing problems Kidney disease Recent or ongoing radiation therapy An unusual or allergic reaction to carboplatin, cisplatin, other medications, foods, dyes, or preservatives Pregnant or trying to get pregnant Breast-feeding How should I use this medication? This medication is injected into a vein. It is given by your care team in a hospital or clinic setting. Talk to your care team about the use of this medication in children. Special care may be needed. Overdosage: If you think you have taken too much of this medicine contact a poison control center or emergency room at once. NOTE: This medicine is only for you. Do not share this medicine with others. What if I miss a dose? Keep appointments for follow-up doses. It is important not to miss your dose. Call your care team if you are unable to keep an appointment. What may interact with this medication? Medications for seizures Some antibiotics, such as amikacin, gentamicin, neomycin, streptomycin, tobramycin Vaccines This list may not describe all possible interactions. Give your health care provider a list of all the medicines, herbs,  non-prescription drugs, or dietary supplements you use. Also tell them if you smoke, drink alcohol, or use illegal drugs. Some items may interact with your medicine. What should I watch for while using this medication? Your condition will be monitored carefully while you are receiving this medication. You may need blood work while taking this medication. This medication may make you feel generally unwell. This is not uncommon, as chemotherapy can affect healthy cells as well as cancer cells. Report any side effects. Continue your course of treatment even though you feel ill unless your care team tells you to stop. In some cases, you may be given additional medications to help with side effects. Follow all directions for their use. This medication may increase your risk of getting an infection. Call your care team for advice if you get a fever, chills, sore throat, or other symptoms of a cold or flu. Do not treat yourself. Try to avoid being around people who are sick. Avoid taking medications that contain aspirin, acetaminophen, ibuprofen, naproxen, or ketoprofen unless instructed by your care team. These medications may hide a fever. Be careful brushing or flossing  your teeth or using a toothpick because you may get an infection or bleed more easily. If you have any dental work done, tell your dentist you are receiving this medication. Talk to your care team if you wish to become pregnant or think you might be pregnant. This medication can cause serious birth defects. Talk to your care team about effective forms of contraception. Do not breast-feed while taking this medication. What side effects may I notice from receiving this medication? Side effects that you should report to your care team as soon as possible: Allergic reactions--skin rash, itching, hives, swelling of the face, lips, tongue, or throat Infection--fever, chills, cough, sore throat, wounds that don't heal, pain or trouble when passing  urine, general feeling of discomfort or being unwell Low red blood cell level--unusual weakness or fatigue, dizziness, headache, trouble breathing Pain, tingling, or numbness in the hands or feet, muscle weakness, change in vision, confusion or trouble speaking, loss of balance or coordination, trouble walking, seizures Unusual bruising or bleeding Side effects that usually do not require medical attention (report to your care team if they continue or are bothersome): Hair loss Nausea Unusual weakness or fatigue Vomiting This list may not describe all possible side effects. Call your doctor for medical advice about side effects. You may report side effects to FDA at 1-800-FDA-1088. Where should I keep my medication? This medication is given in a hospital or clinic. It will not be stored at home. NOTE: This sheet is a summary. It may not cover all possible information. If you have questions about this medicine, talk to your doctor, pharmacist, or health care provider.  2024 Elsevier/Gold Standard (2021-09-12 00:00:00)

## 2023-04-09 NOTE — Progress Notes (Signed)
Radiation Oncology         [phone] ________________________________  Initial Outpatient Consultation  Name: Zoe Kim MRN: [id number]  Date: 04/10/2023  DOB: March 18, 1946  NW:[id number], Meridee Score, MD  Si Gaul, MD   REFERRING PHYSICIAN: Si Gaul, MD  DIAGNOSIS: {There were no encounter diagnoses. (Refresh or delete this SmartLink)}  Stage IIIb (T4, N2, M0) adenocarcinoma of the left lower lung - presented with a large left lower lobe lung mass and left hilar and mediastinal lymphadenopathy - diagnosed in October 2024.   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 female who is accompanied by ***. she is seen as a courtesy of Dr. Arbutus Ped for an opinion concerning radiation therapy as part of management for her recently diagnosed left lung cancer. She is known to me for her history of endometrial cancer s/p vaginal brachytherapy completed in September of 2020. She was last seen here for follow-up on 03/24/2020 and was doing well and NED on examination at that time. For an unknown reason, the patient was then lost to follow-up with medical, radiation, and gynecologic oncology for nearly 3 years. She does have dementia which has progressed and was likely a contributing factor. Her medical history is also notable for atherosclerosis of the aorta, depression, vitamin D deficiency, and peripheral neuropathy, presumably from previous chemotherapy.   Her history pertaining to her recent diagnosis of left lung cancer is detailed as follows.   The patient presented to the ED on 01/29/23 with an ongoing non-productive cough for several weeks and  several months decreased p.o intake with subsequent weight loss. A chest x-ray was accordingly performed which showed a large left lung mass. Chest CT re-demonstrated the the large mass in the left hemithorax measuring up to 10.1 x 7.5 x 11.7 cm. The mass appeared to be centered in the LLL and crossing to the interlobar fissure. Evidence of left hilar and mediastinal lymphadenopathy was also demonstrated.   Before she could proceed with OP work-up, she returned to the ED on 02/12/23 due to a fall which resulted in head trauma. A CT of the head was performed which showed no evidence of acute intracranial injury or evidence of fracture. . ED course included would closure of a laceration on her right frontal scalp.   She was then able to meet with Dr. Bertis Ruddy on 02/18/23 to address the lung mass. Dr. Bertis Ruddy noted the patient to display significant signs of dementia during this visit. Due to her progressive dementia, Dr. Bertis Ruddy recommended proceeding with a palliative care approach. The recommendation of palliative care was presented to the patient's husband at that time, who is her health care POA.   Upon reflecting on his options, (and despite Dr. Maxine Glenn recommendation) the patent's husband elected to pursue further work-up and potential treatment for the patient.   The patient was again brought to the ED by her husband on 03/02/23 due to her ongoing symptoms and progressive cough. A CTA of the chest was performed which showed and interval increase in size of the LLL mass, measuring 11.0 x 8.4 by 12.9 cm, with signs of left hilar involvement and encasement and narrowing of the left lower lobe pulmonary artery and its branches. CT also showed  a new filling defect at the bifurcation of the distal left mainstem bronchus, possibly reflecting an area of mucous plugging vs endobronchial tumor. No evidence of PE was demonstrated. Her progressive symptoms were ultimately attributed to an interval increase in  size of the LLL mass, and she was discharged with instructions to follow-up with OP oncology for further management.   Shortly after being discharged, she also presented for a staging CT AP on 03/05/23 which showed no evidence of metastatic disease in the abdomen or pelvis.   She was then referred to IR and underwent a CT guided needle core biopsy of the LLL mass on 03/25/23. Pathology showed findings consistent with moderately differentiated adenocarcinoma, compatible with a lung primary.   For treatment planning, the patient was seen on consultation by Dr. Arbutus Ped on 04/01/23. Dr. Arbutus Ped has recommended chemotherapy consisting of weekly Carboplatin and Paclitaxel and concurrent radiation therapy. If she has no molecular mutations (pending molecular studies), chemoradiation will likely be followed by immunotherapy. She received her first cycle of Carboplatin and Paclitaxel yesterday.   Pertinent imaging performed thus far includes a PET scan which was performed on 04/03/23. Results are pending at this time.    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   Anemia due to antineoplastic chemotherapy 12/24/2018   Arthritis    Atherosclerosis of aorta 05/23/2021   Chronic fatigue syndrome 04/25/2021   Decreased estrogen level 04/25/2021   Family history of breast cancer    Family history of lung cancer    GAD (generalized anxiety disorder) 04/25/2021   Heart  murmur    "years ago"   History of adenomatous polyp of colon 04/25/2021   History of endometrial cancer    Insomnia    Major depressive disorder 04/25/2021   Neoplasm of uncertain behavior of skin    Obesity 10/14/2018   Peripheral neuropathy due to chemotherapy 11/20/2018   Personal history of malignant neoplasm of other parts of uterus 04/25/2021   Pneumonia    "long time ago"   Primary localized osteoarthritis of right knee 07/30/2016   Urge incontinence of urine 04/25/2021   Vitamin D deficiency 04/25/2021    PAST SURGICAL HISTORY: Past Surgical History:  Procedure Laterality Date   COLONOSCOPY     EYE SURGERY Bilateral    cataract with lens   IR IMAGING GUIDED PORT INSERTION  11/06/2018   IR REMOVAL TUN ACCESS W/ PORT W/O FL MOD SED  02/13/2019   ROBOTIC ASSISTED TOTAL HYSTERECTOMY WITH BILATERAL SALPINGO OOPHERECTOMY N/A 10/14/2018   Procedure: ROBOTIC ASSISTED TOTAL HYSTERECTOMY WITH BILATERAL SALPINGO OOPHORECTOMY;  Surgeon: Adolphus Birchwood, MD;  Location: [facility];  Service: Gynecology;  Laterality: N/A;   SENTINEL NODE BIOPSY N/A 10/14/2018   Procedure: SENTINEL LYMPH NODE BIOPSY;  Surgeon: Adolphus Birchwood, MD;  Location: [facility];  Service: Gynecology;  Laterality: N/A;   TONSILLECTOMY     TOOTH EXTRACTION Right 08/27/2017   TOTAL KNEE ARTHROPLASTY Right 07/30/2016   Procedure: TOTAL KNEE ARTHROPLASTY;  Surgeon: Salvatore Marvel, MD;  Location: [facility];  Service: Orthopedics;  Laterality: Right;   VEIN LIGATION AND STRIPPING      FAMILY HISTORY:  Family History  Problem Relation Age of Onset   Heart disease Mother    COPD Mother    Cancer Mother        uterine (possibly, pt unsure)   Breast cancer Mother        dx in 36s   Dementia Mother        possible mini-strokes   Heart disease Father    COPD Brother    Heart disease Brother    Cancer Brother 31       lung ca, smoker   Cancer Maternal Grandmother        breast dx early 77s   Stroke Maternal Grandfather     SOCIAL  HISTORY:  Social History   Tobacco Use   Smoking status: Never   Smokeless tobacco: Never   Tobacco comments:    smoked 2 weeks in college  Vaping Use   Vaping status: Never Used  Substance Use Topics   Alcohol use: No   Drug use: No    ALLERGIES:  Allergies  Allergen Reactions   Sulfa Antibiotics Hives    UNSPECIFIED REACTION    Sulfamethoxazole-Trimethoprim Other (See Comments)    MEDICATIONS:  Current Outpatient Medications  Medication Sig Dispense Refill   albuterol (VENTOLIN HFA) 108 (90 Base) MCG/ACT inhaler Inhale 2 puffs into the lungs 2 (two) times daily as needed.     Ascorbic Acid (VITAMIN C) 1000 MG tablet Take 1,000 mg by mouth daily.     benzonatate (TESSALON) 100 MG capsule Take 1 capsule (100 mg total) by mouth 3 (three) times daily as needed for cough. 21 capsule 0   buPROPion (WELLBUTRIN XL) 150 MG 24 hr tablet Take 150 mg by mouth every morning.     buPROPion (WELLBUTRIN XL) 300 MG 24 hr tablet 1 tablet in the morning Orally Once a day for 90 days     Cholecalciferol (VITAMIN D) 50 MCG (2000 UT) CAPS Take 2,000 Units by mouth daily.     cyanocobalamin (VITAMIN B12) 500 MCG tablet Take 2,500 mcg by mouth daily.     desvenlafaxine (PRISTIQ) 50 MG 24 hr tablet Take 1 tablet (50 mg total) by mouth daily. 90 tablet 1   donepezil (ARICEPT) 10 MG tablet TAKE   1 TABLET DAILY (Patient taking differently: Take 5 mg by mouth daily. TAKE   1 TABLET DAILY) 30 tablet 11   gabapentin (NEURONTIN) 400 MG capsule Take 400 mg by mouth 2 (two) times daily. 2 capsules BID     Multiple Vitamins-Iron (MULTIVITAMINS WITH IRON) TABS tablet Take 1 tablet by mouth daily.     NON FORMULARY Take 1 tablet by mouth daily. Dynamic Brain vitamin     ondansetron (ZOFRAN) 8 MG tablet Take 1 tablet (8 mg total) by mouth every 8 (eight) hours as needed for nausea or vomiting. Start on the third day after chemotherapy. 30 tablet 1   oxybutynin (DITROPAN) 5 MG tablet Take 15 mg by mouth daily.      prochlorperazine (COMPAZINE) 10 MG tablet Take 1 tablet (10 mg total) by mouth every 6 (six) hours as needed for nausea or vomiting. 30 tablet 1   No current facility-administered medications for this encounter.   Facility-Administered Medications Ordered in Other Encounters  Medication Dose Route Frequency Provider Last Rate Last Admin   0.9 %  sodium chloride infusion  Intravenous Continuous Si Gaul, MD   Stopped at 04/09/23 1327    REVIEW OF SYSTEMS:  A 10+ POINT REVIEW OF SYSTEMS WAS OBTAINED including neurology, dermatology, psychiatry, cardiac, respiratory, lymph, extremities, GI, GU, musculoskeletal, constitutional, reproductive, HEENT. ***   PHYSICAL EXAM:  vitals were not taken for this visit.   General: Alert and oriented, in no acute distress HEENT: Head is normocephalic. Extraocular movements are intact. Oropharynx is clear. Neck: Neck is supple, no palpable cervical or supraclavicular lymphadenopathy. Heart: Regular in rate and rhythm with no murmurs, rubs, or gallops. Chest: Clear to auscultation bilaterally, with no rhonchi, wheezes, or rales. Abdomen: Soft, nontender, nondistended, with no rigidity or guarding. Extremities: No cyanosis or edema. Lymphatics: see Neck Exam Skin: No concerning lesions. Musculoskeletal: symmetric strength and muscle tone throughout. Neurologic: Cranial nerves II through XII are grossly intact. No obvious focalities. Speech is fluent. Coordination is intact. Psychiatric: Judgment and insight are intact. Affect is appropriate. ***  ECOG = ***  0 - Asymptomatic (Fully active, able to carry on all predisease activities without restriction)  1 - Symptomatic but completely ambulatory (Restricted in physically strenuous activity but ambulatory and able to carry out work of a light or sedentary nature. For example, light housework, office work)  2 - Symptomatic, <50% in bed during the day (Ambulatory and capable of all self care but  unable to carry out any work activities. Up and about more than 50% of waking hours)  3 - Symptomatic, >50% in bed, but not bedbound (Capable of only limited self-care, confined to bed or chair 50% or more of waking hours)  4 - Bedbound (Completely disabled. Cannot carry on any self-care. Totally confined to bed or chair)  5 - Death   Santiago Glad MM, Creech RH, Tormey DC, et al. [id number]). "Toxicity and response criteria of the [facility]". Am. Evlyn Clines. Oncol. 5 (6): 649-55  LABORATORY DATA:  Lab Results  Component Value Date   WBC 8.5 04/09/2023   HGB 10.8 (L) 04/09/2023   HCT 33.2 (L) 04/09/2023   MCV 86.5 04/09/2023   PLT 440 (H) 04/09/2023   NEUTROABS 6.7 04/09/2023   Lab Results  Component Value Date   NA 128 (L) 04/09/2023   K 3.8 04/09/2023   CL 92 (L) 04/09/2023   CO2 28 04/09/2023   GLUCOSE 124 (H) 04/09/2023   BUN 10 04/09/2023   CREATININE 0.87 04/09/2023   CALCIUM 9.1 04/09/2023      RADIOGRAPHY: CT LUNG MASS BIOPSY  Result Date: 03/25/2023 CLINICAL DATA:  Left lower lobe lung mass and history of endometrial carcinoma. EXAM: CT GUIDED CORE BIOPSY OF LEFT LOWER LOBE LUNG MASS ANESTHESIA/SEDATION: Moderate (conscious) sedation was employed during this procedure. A total of Versed 1.5 mg and Fentanyl 75 mcg was administered intravenously. Moderate Sedation Time: 24 minutes. The patient's level of consciousness and vital signs were monitored continuously by radiology nursing throughout the procedure under my direct supervision. PROCEDURE: The procedure risks, benefits, and alternatives were explained to the patient. Questions regarding the procedure were encouraged and answered. The patient understands and consents to the procedure. A time-out was performed prior to initiating the procedure. CT was performed through the chest in a prone position. The left posterior chest wall was prepped with chlorhexidine in a sterile fashion, and a sterile drape was applied  covering the operative field. A sterile gown and sterile gloves were used for the procedure. Local anesthesia was provided with 1% Lidocaine. Under CT guidance, a 17 gauge  trocar needle was advanced into the posterior aspect of a left lower lobe lung mass. After confirming needle tip position, 2 separate coaxial 18 gauge core biopsy samples were obtained and submitted in formalin. Gel-Foam pledgets were advanced through the outer needle on completion as well as a BioSentry plug. Additional CT was performed. RADIATION DOSE REDUCTION: This exam was performed according to the departmental dose-optimization program which includes automated exposure control, adjustment of the mA and/or kV according to patient size and/or use of iterative reconstruction technique. COMPLICATIONS: None FINDINGS: Initial CT demonstrates a large rounded mass in the posterior left lower lobe measuring up to 11.4 cm in maximum diameter. Central aspect of the mass is of low density likely consistent with necrosis. The posterior margin of the mass was targeted which demonstrates higher density. Solid core biopsy samples were obtained. Post biopsy imaging demonstrates no pneumothorax or hemothorax. IMPRESSION: CT-guided core biopsy performed of a large left lower lobe lung mass. Electronically Signed   By: Irish Lack M.D.   On: 03/25/2023 11:03      IMPRESSION: Stage IIIb (T4, N2, M0) adenocarcinoma of the left lower lung - presented with a large left lower lobe lung mass and left hilar and mediastinal lymphadenopathy - diagnosed in October 2024.   ***  Today, I talked to the patient and family about the findings and work-up thus far.  We discussed the natural history of *** and general treatment, highlighting the role of radiotherapy in the management.  We discussed the available radiation techniques, and focused on the details of logistics and delivery.  We reviewed the anticipated acute and late sequelae associated with radiation in  this setting.  The patient was encouraged to ask questions that I answered to the best of my ability. *** A patient consent form was discussed and signed.  We retained a copy for our records.  The patient would like to proceed with radiation and will be scheduled for CT simulation.  PLAN: ***    *** minutes of total time was spent for this patient encounter, including preparation, face-to-face counseling with the patient and coordination of care, physical exam, and documentation of the encounter.   ------------------------------------------------  Billie Lade, PhD, MD  This document serves as a record of services personally performed by Antony Blackbird, MD. It was created on his behalf by Neena Rhymes, a trained medical scribe. The creation of this record is based on the scribe's personal observations and the provider's statements to them. This document has been checked and approved by the attending provider.

## 2023-04-09 NOTE — Progress Notes (Signed)
Location of tumor and Histology per Pathology Report: Lung cancer    Biopsy:    SURGICAL PATHOLOGY CASE: [id number] PATIENT: Zoe Kim Surgical Pathology Report Clinical History: large LLL lung mass, history of endometrial carcinoma (cm)  FINAL MICROSCOPIC DIAGNOSIS:  A. LEFT LUNG, LOWER LOBE, MASS, NEEDLE CORE BIOPSY: Moderately differentiated adenocarcinoma compatible with a lung primary  COMMENT:  Sections show cores of desmoplastic fibrotic stroma with associated extensive geographic necrosis.  Focally the stroma is infiltrated by a tumor showing irregular angulated glands and sheets composed of cells with enlarged round to oval hyperchromatic atypical nuclei with scattered conspicuous nucleoli. Four immunohistochemical stains are performed with adequate control.  The tumor is positive for cytokeratin 7 and shows focal positivity for pulmonary adeno marker TTF1.  The tumor is negative for Napsin A.  The tumor is also negative for the GYN marker PAX8.  Case is reviewed by Dr. Reynolds Bowl who concurs with the interpretation.  Diagnosis conveyed to Drs. Andria Frames and Kress by Dr. Venetia Night via Epic messaging on 03/27/2023 at 10:15 AM.   Past/Anticipated interventions by surgeon, if any: {t:[id number]} ***  Past/Anticipated interventions by medical oncology, if any: Chemotherapy    PLAN: I had a lengthy discussion with the patient and her husband today about her current disease stage, prognosis and treatment options. I personally and independently reviewed her imaging studies and I discussed the result with the patient and her husband. Assessment and Plan    Non-Small Cell Lung Cancer (Adenocarcinoma) Newly diagnosed with a large (10cm) mass in the left lower lobe. The mass is causing cough and is at risk of closing the left airway. No evidence of metastasis on CT scan. Awaiting results of molecular markers and PD-L1 expression. -Order PET scan to confirm no  distant metastasis. -Refer to radiation oncology for planning and initiation of radiation therapy. -Plan to start weekly Carboplatin and Paclitaxel chemotherapy in conjunction with radiation therapy. -After completion of chemo-radiation, plan to start immunotherapy if no evidence of molecular mutation. -Monitor closely for side effects of chemotherapy including neuropathy, myelosuppression, nausea, vomiting, and fatigue.   History of Endometrial Cancer Treated with surgery, chemotherapy, and radiation in 2022. No current issues reported. -Continue routine follow-up as per oncology.   Weight Loss Reported significant weight loss over the past 4 months. -Monitor closely during cancer treatment. Consider dietary consultation if weight loss continues.   Peripheral Neuropathy History of neuropathy likely secondary to previous chemotherapy. -Continue Gabapentin as needed. Monitor for potential exacerbation with new chemotherapy regimen.     The patient was advised to call immediately if she has any other concerning symptoms in the interval. The patient voices understanding of current disease status and treatment options and is in agreement with the current care plan.   Pain issues, if any:  {:18581} {PAIN DESCRIPTION:21022940}  SAFETY ISSUES: Prior radiation? {:18581} Pacemaker/ICD? {:18581} Possible current pregnancy? no Is the patient on methotrexate? {:18581}  Current Complaints / other details:  ***     ***

## 2023-04-09 NOTE — Progress Notes (Signed)
Pharmacist Chemotherapy Monitoring - Initial Assessment    Anticipated start date: 04/09/23   The following has been reviewed per standard work regarding the patient's treatment regimen: The patient's diagnosis, treatment plan and drug doses, and organ/hematologic function Lab orders and baseline tests specific to treatment regimen  The treatment plan start date, drug sequencing, and pre-medications Prior authorization status  Patient's documented medication list, including drug-drug interaction screen and prescriptions for anti-emetics and supportive care specific to the treatment regimen The drug concentrations, fluid compatibility, administration routes, and timing of the medications to be used The patient's access for treatment and lifetime cumulative dose history, if applicable  The patient's medication allergies and previous infusion related reactions, if applicable   Changes made to treatment plan:  N/A  Follow up needed:  N/A   Sharen Hones, PharmD, BCPS Clinical Pharmacist   04/09/2023  9:39 AM

## 2023-04-10 ENCOUNTER — Encounter (HOSPITAL_COMMUNITY): Payer: Self-pay

## 2023-04-10 ENCOUNTER — Telehealth: Payer: Self-pay

## 2023-04-10 ENCOUNTER — Ambulatory Visit
Admission: RE | Admit: 2023-04-10 | Discharge: 2023-04-10 | Disposition: A | Discharge status: Home / Self Care | Payer: PPO | Source: Ambulatory Visit | Attending: Radiation Oncology | Admitting: Radiation Oncology

## 2023-04-10 ENCOUNTER — Encounter: Payer: Self-pay | Admitting: Radiation Oncology

## 2023-04-10 VITALS — BP 122/76 | HR 117 | Temp 97.3°F | Resp 20 | Ht 65.0 in | Wt 162.2 lb

## 2023-04-10 DIAGNOSIS — Z79899 Other long term (current) drug therapy: Secondary | ICD-10-CM | POA: Diagnosis not present

## 2023-04-10 DIAGNOSIS — Z803 Family history of malignant neoplasm of breast: Secondary | ICD-10-CM | POA: Diagnosis not present

## 2023-04-10 DIAGNOSIS — G629 Polyneuropathy, unspecified: Secondary | ICD-10-CM | POA: Diagnosis not present

## 2023-04-10 DIAGNOSIS — M129 Arthropathy, unspecified: Secondary | ICD-10-CM | POA: Insufficient documentation

## 2023-04-10 DIAGNOSIS — Z90722 Acquired absence of ovaries, bilateral: Secondary | ICD-10-CM | POA: Insufficient documentation

## 2023-04-10 DIAGNOSIS — Z8542 Personal history of malignant neoplasm of other parts of uterus: Secondary | ICD-10-CM | POA: Insufficient documentation

## 2023-04-10 DIAGNOSIS — Z90710 Acquired absence of both cervix and uterus: Secondary | ICD-10-CM | POA: Insufficient documentation

## 2023-04-10 DIAGNOSIS — E559 Vitamin D deficiency, unspecified: Secondary | ICD-10-CM | POA: Insufficient documentation

## 2023-04-10 DIAGNOSIS — C3490 Malignant neoplasm of unspecified part of unspecified bronchus or lung: Secondary | ICD-10-CM

## 2023-04-10 DIAGNOSIS — C3432 Malignant neoplasm of lower lobe, left bronchus or lung: Secondary | ICD-10-CM

## 2023-04-10 DIAGNOSIS — Z860101 Personal history of adenomatous and serrated colon polyps: Secondary | ICD-10-CM | POA: Diagnosis not present

## 2023-04-10 DIAGNOSIS — I700 Atherosclerosis of aorta: Secondary | ICD-10-CM | POA: Diagnosis not present

## 2023-04-10 DIAGNOSIS — F0393 Unspecified dementia, unspecified severity, with mood disturbance: Secondary | ICD-10-CM | POA: Diagnosis not present

## 2023-04-10 DIAGNOSIS — Z9221 Personal history of antineoplastic chemotherapy: Secondary | ICD-10-CM | POA: Diagnosis not present

## 2023-04-10 DIAGNOSIS — Z801 Family history of malignant neoplasm of trachea, bronchus and lung: Secondary | ICD-10-CM | POA: Diagnosis not present

## 2023-04-10 NOTE — Telephone Encounter (Signed)
-----   Message from Nurse Dillard Essex sent at 04/09/2023  1:57 PM EST ----- Regarding: Dr. Arbutus Ped First time Taxol and Carboplatin. Tolerated well no issues noted. Pt. spouse requested a nutrition consult and new medication to increase her appetite. A nutrition consult was put in but Dr. Arbutus Ped did not respond about the medication. Please check and see if something was ordered if not he will need to ask during 04/16/23 MD visit. Thank you!

## 2023-04-10 NOTE — Telephone Encounter (Signed)
LM for patient that this nurse was calling to see how they were doing after their treatment. Please call back to Dr. Mohamed's nurse at [phone] if they have any questions or concerns regarding the treatment.  

## 2023-04-11 ENCOUNTER — Ambulatory Visit: Payer: PPO | Admitting: Physical Therapy

## 2023-04-11 ENCOUNTER — Other Ambulatory Visit: Payer: Self-pay | Admitting: Medical Oncology

## 2023-04-11 DIAGNOSIS — C3490 Malignant neoplasm of unspecified part of unspecified bronchus or lung: Secondary | ICD-10-CM

## 2023-04-11 MED ORDER — ONDANSETRON HCL 8 MG PO TABS: 8.0000 mg | ORAL_TABLET | Freq: Three times a day (TID) | ORAL | 1 refills | Status: DC | PRN

## 2023-04-13 ENCOUNTER — Telehealth: Payer: Self-pay | Admitting: Internal Medicine

## 2023-04-13 NOTE — Telephone Encounter (Signed)
Called and scheduled appointment per scheduling message. Left VM with appointment details.

## 2023-04-16 ENCOUNTER — Inpatient Hospital Stay: Payer: PPO

## 2023-04-16 ENCOUNTER — Encounter: Payer: Self-pay | Admitting: Internal Medicine

## 2023-04-16 ENCOUNTER — Inpatient Hospital Stay: Payer: PPO | Admitting: Internal Medicine

## 2023-04-16 VITALS — BP 98/65 | HR 116 | Temp 97.7°F | Resp 16 | Ht 65.0 in | Wt 159.6 lb

## 2023-04-16 DIAGNOSIS — C3432 Malignant neoplasm of lower lobe, left bronchus or lung: Secondary | ICD-10-CM

## 2023-04-16 DIAGNOSIS — C3490 Malignant neoplasm of unspecified part of unspecified bronchus or lung: Secondary | ICD-10-CM

## 2023-04-16 DIAGNOSIS — Z5111 Encounter for antineoplastic chemotherapy: Secondary | ICD-10-CM | POA: Diagnosis not present

## 2023-04-16 LAB — CMP (CANCER CENTER ONLY)
ALT: 24 U/L (ref 0–44)
AST: 36 U/L (ref 15–41)
Albumin: 3.4 g/dL — ABNORMAL LOW (ref 3.5–5.0)
Alkaline Phosphatase: 121 U/L (ref 38–126)
Anion gap: 8 (ref 5–15)
BUN: 18 mg/dL (ref 8–23)
CO2: 27 mmol/L (ref 22–32)
Calcium: 9.2 mg/dL (ref 8.9–10.3)
Chloride: 94 mmol/L — ABNORMAL LOW (ref 98–111)
Creatinine: 0.85 mg/dL (ref 0.44–1.00)
GFR, Estimated: >60 mL/min (ref >60)
Glucose, Bld: 126 mg/dL — ABNORMAL HIGH (ref 70–99)
Potassium: 4 mmol/L (ref 3.5–5.1)
Sodium: 129 mmol/L — ABNORMAL LOW (ref 135–145)
Total Bilirubin: 0.4 mg/dL (ref <1.2)
Total Protein: 6.8 g/dL (ref 6.5–8.1)

## 2023-04-16 LAB — CBC WITH DIFFERENTIAL (CANCER CENTER ONLY)
Abs Immature Granulocytes: 0.08 10*3/uL — ABNORMAL HIGH (ref 0.00–0.07)
Basophils Absolute: 0.1 10*3/uL (ref 0.0–0.1)
Basophils Relative: 1 %
Eosinophils Absolute: 0.2 10*3/uL (ref 0.0–0.5)
Eosinophils Relative: 3 %
HCT: 30.4 % — ABNORMAL LOW (ref 36.0–46.0)
Hemoglobin: 10.4 g/dL — ABNORMAL LOW (ref 12.0–15.0)
Immature Granulocytes: 1 %
Lymphocytes Relative: 13 %
Lymphs Abs: 0.8 10*3/uL (ref 0.7–4.0)
MCH: 29 pg (ref 26.0–34.0)
MCHC: 34.2 g/dL (ref 30.0–36.0)
MCV: 84.7 fL (ref 80.0–100.0)
Monocytes Absolute: 0.4 10*3/uL (ref 0.1–1.0)
Monocytes Relative: 8 %
Neutro Abs: 4.3 10*3/uL (ref 1.7–7.7)
Neutrophils Relative %: 74 %
Platelet Count: 375 10*3/uL (ref 150–400)
RBC: 3.59 MIL/uL — ABNORMAL LOW (ref 3.87–5.11)
RDW: 14.6 % (ref 11.5–15.5)
WBC Count: 5.8 10*3/uL (ref 4.0–10.5)
nRBC: 0 % (ref 0.0–0.2)

## 2023-04-16 MED ORDER — FOLIC ACID 1 MG PO TABS: 1.0000 mg | ORAL_TABLET | Freq: Every day | ORAL | 3 refills | Status: DC

## 2023-04-16 MED ORDER — CYANOCOBALAMIN 1000 MCG/ML IJ SOLN
1000.0000 ug | Freq: Once | INTRAMUSCULAR | Status: AC
  Administered 2023-04-16: 1000 ug via INTRAMUSCULAR
  Filled 2023-04-16: qty 1

## 2023-04-16 MED ORDER — DEXAMETHASONE 4 MG PO TABS: ORAL_TABLET | ORAL | 1 refills | Status: DC

## 2023-04-16 NOTE — Progress Notes (Signed)
[facility] [phone]   [phone]  OFFICE PROGRESS NOTE  Shon Hale, MD [address] [place] [address]  DIAGNOSIS: Stage IV (T4, N2, M1b) non-small cell lung cancer, adenocarcinoma presented with large left lower lobe lung mass in addition to left hilar and mediastinal lymphadenopathy in addition to metastatic disease to the right adrenal gland and suspicious right sixth rib bone lesion diagnosed in October 2024.  Molecular studies are still pending  PRIOR THERAPY: None  CURRENT THERAPY: Systemic chemotherapy with carboplatin for AUC of 5, Alimta 500 Mg/M2 and Avastin 15 Mg/KG.  For cycle April 23, 2023  INTERVAL HISTORY: Zoe Kim 77 y.o. female returns to the clinic today for follow-up visit accompanied by her husband.Discussed the use of AI scribe software for clinical note transcription with the patient, who gave verbal consent to proceed.  History of Present Illness   Zoe Kim, a 77 year old individual with stage four non-small cell lung cancer (adenocarcinoma), was diagnosed in October 2024. The disease initially presented with a large mass in the left lower lobe of the lung, lymph node involvement, and metastasis to the right adrenal gland. The discovery of the adrenal gland lesion led to the progression from stage three to stage four.  Recently, the patient has been experiencing gastrointestinal discomfort, suggestive of impending diarrhea. She reports a change in bowel habits, with the last bowel movement occurring a couple of days prior to the consultation. The patient denies taking any laxatives or stool softeners.  The patient was also expecting to undergo surgery, indicating a misunderstanding or lack of clarity about the current stage of her disease and the treatment plan. The patient's spouse is involved in the care and was present during the consultation.       MEDICAL  HISTORY: 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   Anemia due to antineoplastic chemotherapy 12/24/2018   Arthritis    Atherosclerosis of aorta 05/23/2021   Chronic fatigue syndrome 04/25/2021   Decreased estrogen level 04/25/2021   Family history of breast cancer    Family history of lung cancer    GAD (generalized anxiety disorder) 04/25/2021   Heart murmur    "years ago"   History of adenomatous polyp of colon 04/25/2021   History of endometrial cancer    Insomnia    Major depressive disorder 04/25/2021   Neoplasm of uncertain behavior of skin    Obesity 10/14/2018   Peripheral neuropathy due to chemotherapy 11/20/2018   Personal history of malignant neoplasm of other parts of uterus 04/25/2021   Pneumonia    "long time ago"   Primary localized osteoarthritis of right knee 07/30/2016   Urge incontinence of urine 04/25/2021   Vitamin D deficiency 04/25/2021    ALLERGIES:  is allergic to sulfa antibiotics and sulfamethoxazole-trimethoprim.  MEDICATIONS:  Current Outpatient Medications  Medication Sig Dispense Refill   albuterol (VENTOLIN HFA) 108 (90 Base) MCG/ACT inhaler Inhale 2 puffs into the lungs 2 (two) times daily as needed.     Ascorbic Acid (VITAMIN C) 1000 MG tablet Take 1,000 mg by mouth daily.     benzonatate (TESSALON) 100 MG capsule Take 1 capsule (100 mg total) by mouth 3 (three) times daily as needed for cough. 21 capsule 0   buPROPion (WELLBUTRIN XL) 150 MG 24 hr tablet Take 150 mg by mouth every morning.     buPROPion (WELLBUTRIN XL) 300 MG  24 hr tablet 1 tablet in the morning Orally Once a day for 90 days     Cholecalciferol (VITAMIN D) 50 MCG (2000 UT) CAPS Take 2,000 Units by mouth daily.     cyanocobalamin (VITAMIN B12) 500 MCG tablet Take 2,500 mcg by mouth daily.     desvenlafaxine (PRISTIQ) 50 MG 24 hr tablet Take 1 tablet (50 mg total) by mouth daily. 90 tablet 1   donepezil  (ARICEPT) 10 MG tablet TAKE   1 TABLET DAILY (Patient taking differently: Take 5 mg by mouth daily. TAKE   1 TABLET DAILY) 30 tablet 11   gabapentin (NEURONTIN) 400 MG capsule Take 400 mg by mouth 2 (two) times daily. 2 capsules BID     Multiple Vitamins-Iron (MULTIVITAMINS WITH IRON) TABS tablet Take 1 tablet by mouth daily.     NON FORMULARY Take 1 tablet by mouth daily. Dynamic Brain vitamin     ondansetron (ZOFRAN) 8 MG tablet Take 1 tablet (8 mg total) by mouth every 8 (eight) hours as needed for nausea or vomiting. Start on the third day after chemotherapy. 30 tablet 1   oxybutynin (DITROPAN) 5 MG tablet Take 15 mg by mouth daily.     prochlorperazine (COMPAZINE) 10 MG tablet Take 1 tablet (10 mg total) by mouth every 6 (six) hours as needed for nausea or vomiting. 30 tablet 1   No current facility-administered medications for this visit.    SURGICAL HISTORY:  Past Surgical History:  Procedure Laterality Date   COLONOSCOPY     EYE SURGERY Bilateral    cataract with lens   IR IMAGING GUIDED PORT INSERTION  11/06/2018   IR REMOVAL TUN ACCESS W/ PORT W/O FL MOD SED  02/13/2019   ROBOTIC ASSISTED TOTAL HYSTERECTOMY WITH BILATERAL SALPINGO OOPHERECTOMY N/A 10/14/2018   Procedure: ROBOTIC ASSISTED TOTAL HYSTERECTOMY WITH BILATERAL SALPINGO OOPHORECTOMY;  Surgeon: Adolphus Birchwood, MD;  Location: [facility];  Service: Gynecology;  Laterality: N/A;   SENTINEL NODE BIOPSY N/A 10/14/2018   Procedure: SENTINEL LYMPH NODE BIOPSY;  Surgeon: Adolphus Birchwood, MD;  Location: [facility];  Service: Gynecology;  Laterality: N/A;   TONSILLECTOMY     TOOTH EXTRACTION Right 08/27/2017   TOTAL KNEE ARTHROPLASTY Right 07/30/2016   Procedure: TOTAL KNEE ARTHROPLASTY;  Surgeon: Salvatore Marvel, MD;  Location: [facility];  Service: Orthopedics;  Laterality: Right;   VEIN LIGATION AND STRIPPING      REVIEW OF SYSTEMS:  Constitutional: positive for fatigue Eyes: negative Ears, nose, mouth, throat, and face: negative Respiratory:  positive for cough and dyspnea on exertion Cardiovascular: negative Gastrointestinal: positive for constipation Genitourinary:negative Integument/breast: negative Hematologic/lymphatic: negative Musculoskeletal:negative Neurological: negative Behavioral/Psych: negative Endocrine: negative Allergic/Immunologic: negative   PHYSICAL EXAMINATION: General appearance: alert, cooperative, fatigued, and no distress Head: Normocephalic, without obvious abnormality, atraumatic Neck: no adenopathy, no JVD, supple, symmetrical, trachea midline, and thyroid not enlarged, symmetric, no tenderness/mass/nodules Lymph nodes: Cervical, supraclavicular, and axillary nodes normal. Resp: clear to auscultation bilaterally Back: symmetric, no curvature. ROM normal. No CVA tenderness. Cardio: regular rate and rhythm, S1, S2 normal, no murmur, click, rub or gallop GI: soft, non-tender; bowel sounds normal; no masses,  no organomegaly Extremities: extremities normal, atraumatic, no cyanosis or edema Neurologic: Alert and oriented X 3, normal strength and tone. Normal symmetric reflexes. Normal coordination and gait  ECOG PERFORMANCE STATUS: 1 - Symptomatic but completely ambulatory  Blood pressure 98/65, pulse (!) 116, temperature 97.7 F (36.5 C), temperature source Temporal, resp. rate 16, height 5\' 5"  (1.651 m), weight 159  lb 9.6 oz (72.4 kg), SpO2 100%.  LABORATORY DATA: Lab Results  Component Value Date   WBC 5.8 04/16/2023   HGB 10.4 (L) 04/16/2023   HCT 30.4 (L) 04/16/2023   MCV 84.7 04/16/2023   PLT 375 04/16/2023      Chemistry      Component Value Date/Time   NA 129 (L) 04/16/2023 1113   NA 137 04/25/2021 0952   K 4.0 04/16/2023 1113   CL 94 (L) 04/16/2023 1113   CO2 27 04/16/2023 1113   BUN 18 04/16/2023 1113   BUN 12 04/25/2021 0952   CREATININE 0.85 04/16/2023 1113   CREATININE 0.86 05/22/2012 1415      Component Value Date/Time   CALCIUM 9.2 04/16/2023 1113   ALKPHOS 121  04/16/2023 1113   AST 36 04/16/2023 1113   ALT 24 04/16/2023 1113   BILITOT 0.4 04/16/2023 1113       RADIOGRAPHIC STUDIES: NM PET Image Initial (PI) Skull Base To Thigh (F-18 FDG)  Result Date: 04/10/2023 CLINICAL DATA:  Initial treatment strategy for non small cell lung cancer. History of uterine cancer with radiation therapy and chemotherapy 4 years prior. EXAM: NUCLEAR MEDICINE PET SKULL BASE TO THIGH TECHNIQUE: 8.0 mCi F-18 FDG was injected intravenously. Full-ring PET imaging was performed from the skull base to thigh after the radiotracer. CT data was obtained and used for attenuation correction and anatomic localization. Fasting blood glucose: 113 mg/dl COMPARISON:  None Available. FINDINGS: NECK: No hypermetabolic lymph nodes in the neck. Incidental CT findings: None. CHEST: Large LEFT lobe mass has intense peripheral metabolic activity consistent with malignancy and central necrosis. Mass measures 11.1 x 8.8 cm with SUV max equal 30.1 along the periphery of the mass (image 79). No hypermetabolic mediastinal lymph nodes Incidental CT findings: .Tiny calcified nodule in the RIGHT upper lobe on image 38 is benign. ABDOMEN/PELVIS: There is intense metabolic activity associated with enlarged RIGHT adrenal gland measuring 2.9 cm with SUV max equal 9.0 on image 101. LEFT adrenal gland normal. No evidence of liver metastasis. No metastatic adenopathy in the abdomen pelvis. Incidental CT findings: Atherosclerotic calcification of the aorta. No radiotracer activity associated with the vaginal cuff. SKELETON: Focus of metabolic activity associated with the posterior RIGHT sixth rib (image 69). No CT correlation. SUV max equal 5.0. Incidental CT findings: None. IMPRESSION: 1. Large LEFT upper lobe mass with intense peripheral metabolic activity consistent with primary bronchogenic carcinoma. 2. No evidence of metastatic adenopathy in the chest. 3. Intense metabolic activity associated with enlarged RIGHT  adrenal gland is consistent with adrenal metastasis. 4. Focus of metabolic activity associated with the posterior RIGHT sixth rib is indeterminate. Favor metastatic lesion. Electronically Signed   By: Genevive Bi M.D.   On: 04/10/2023 13:04   CT LUNG MASS BIOPSY  Result Date: 03/25/2023 CLINICAL DATA:  Left lower lobe lung mass and history of endometrial carcinoma. EXAM: CT GUIDED CORE BIOPSY OF LEFT LOWER LOBE LUNG MASS ANESTHESIA/SEDATION: Moderate (conscious) sedation was employed during this procedure. A total of Versed 1.5 mg and Fentanyl 75 mcg was administered intravenously. Moderate Sedation Time: 24 minutes. The patient's level of consciousness and vital signs were monitored continuously by radiology nursing throughout the procedure under my direct supervision. PROCEDURE: The procedure risks, benefits, and alternatives were explained to the patient. Questions regarding the procedure were encouraged and answered. The patient understands and consents to the procedure. A time-out was performed prior to initiating the procedure. CT was performed through the chest in a prone  position. The left posterior chest wall was prepped with chlorhexidine in a sterile fashion, and a sterile drape was applied covering the operative field. A sterile gown and sterile gloves were used for the procedure. Local anesthesia was provided with 1% Lidocaine. Under CT guidance, a 17 gauge trocar needle was advanced into the posterior aspect of a left lower lobe lung mass. After confirming needle tip position, 2 separate coaxial 18 gauge core biopsy samples were obtained and submitted in formalin. Gel-Foam pledgets were advanced through the outer needle on completion as well as a BioSentry plug. Additional CT was performed. RADIATION DOSE REDUCTION: This exam was performed according to the departmental dose-optimization program which includes automated exposure control, adjustment of the mA and/or kV according to patient size  and/or use of iterative reconstruction technique. COMPLICATIONS: None FINDINGS: Initial CT demonstrates a large rounded mass in the posterior left lower lobe measuring up to 11.4 cm in maximum diameter. Central aspect of the mass is of low density likely consistent with necrosis. The posterior margin of the mass was targeted which demonstrates higher density. Solid core biopsy samples were obtained. Post biopsy imaging demonstrates no pneumothorax or hemothorax. IMPRESSION: CT-guided core biopsy performed of a large left lower lobe lung mass. Electronically Signed   By: Irish Lack M.D.   On: 03/25/2023 11:03    ASSESSMENT AND PLAN: This is a very pleasant 77 years old white female with stage IV (T4, N2, M1b) non-small cell lung cancer, adenocarcinoma presented with large left lower lobe lung mass in addition to left hilar and mediastinal lymphadenopathy in addition to metastatic disease to the right adrenal gland and suspicious right sixth rib bone lesion diagnosed in October 2024. Molecular studies and PD-L1 expression are still pending.  Her PET scan performed recently showed evidence of metastatic disease to the right adrenal gland.  We will change her treatment from a course of concurrent chemoradiation with weekly carboplatin and paclitaxel to first-line treatment with carboplatin for AUC of 5, Alimta 500 Mg/M2 and Avastin 15 MGs/KG every 3 weeks until the availability of the molecular studies.  If the patient has an actionable mutation, we will switch her treatment to target therapy.  If no evidence for actionable mutations, we will change her Avastin to pembrolizumab starting from cycle #2.     Stage IV Non-Small Cell Lung Cancer (NSCLC) - Adenocarcinoma Diagnosed in October 2024 with a large mass in the left lower lobe, metastasis to lymph nodes and right adrenal gland. Current symptoms include gastrointestinal upset. Treatment adjusted to chemotherapy due to metastasis. Awaiting molecular marker  results for potential targeted therapy. Immediate chemotherapy planned to prevent further progression. Risks include nausea, vomiting, and cytopenias. Benefits include potential tumor reduction and symptom management. Targeted therapy may reduce the need for continued chemotherapy if molecular markers are positive. - Administer B12 injection today - Prescribe folic acid 1 tablet daily - Prescribe steroids: 1 tablet in the morning and evening, the day before, the day of, and the day after treatment - Arrange chemotherapy with carboplatin and Alimta starting next week - Monitor blood counts, kidney, and liver function regularly - Perform blood test for genetic marker - Await tissue biopsy results for genetic marker  Potential Diarrhea Reports impending diarrhea, last bowel movement a couple of days ago. No laxatives or stool softeners taken. Advised that diarrhea may occur as the body clears out. - Monitor bowel movements - Advise use of antiemetics as needed  General Health Maintenance Discussed importance of managing side effects  and maintaining overall health during treatment. - Use antiemetics as needed - Monitor for chemotherapy side effects including nausea, vomiting, and cytopenias  Follow-up - Arrange follow-up appointment after initial chemotherapy cycle - Review molecular marker results from blood test and tissue biopsy in 1-2 weeks.   The patient was advised to call immediately if she has any other concerning symptoms in the interval. The patient voices understanding of current disease status and treatment options and is in agreement with the current care plan.  All questions were answered. The patient knows to call the clinic with any problems, questions or concerns. We can certainly see the patient much sooner if necessary.  The total time spent in the appointment was 30 minutes.  Disclaimer: This note was dictated with voice recognition software. Similar sounding words can  inadvertently be transcribed and may not be corrected upon review.

## 2023-04-16 NOTE — Progress Notes (Signed)
DISCONTINUE OFF PATHWAY REGIMEN - Non-Small Cell Lung   [id number]:Carboplatin AUC=2 IV D1 + Paclitaxel 45 mg/m2 IV D1 q7 Days + RT:   A cycle is every 7 days, concurrent with RT:     Paclitaxel      Carboplatin   **Always confirm dose/schedule in your pharmacy ordering system**  REASON: Other Reason PRIOR TREATMENT: Off Pathway: Carboplatin AUC=2 IV D1 + Paclitaxel 45 mg/m2 IV D1 q7 Days + RT TREATMENT RESPONSE: Unable to Evaluate  START ON PATHWAY REGIMEN - Non-Small Cell Lung     Cycles 1 through up to 6: A cycle is every 21 days:     Bevacizumab-xxxx      Pemetrexed      Carboplatin   **Always confirm dose/schedule in your pharmacy ordering system**  Patient Characteristics: Stage IV Metastatic, Nonsquamous, Awaiting Molecular Test Results and Need to Start Chemotherapy, PS = 0, 1 Therapeutic Status: Stage IV Metastatic Histology: Nonsquamous Cell Broad Molecular Profiling Status: Awaiting Molecular Test Results and Need to Start Chemotherapy ECOG Performance Status: 1 Intent of Therapy: Non-Curative / Palliative Intent, Discussed with Patient

## 2023-04-18 ENCOUNTER — Telehealth: Payer: Self-pay | Admitting: Internal Medicine

## 2023-04-18 NOTE — Telephone Encounter (Signed)
Left a voicemail on both patient and patient's spouse phone regarding scheduled appointment times/dates; left callback if needed to reschedule

## 2023-04-19 ENCOUNTER — Encounter: Payer: Self-pay | Admitting: Internal Medicine

## 2023-04-21 DIAGNOSIS — C3490 Malignant neoplasm of unspecified part of unspecified bronchus or lung: Secondary | ICD-10-CM | POA: Diagnosis not present

## 2023-04-22 ENCOUNTER — Ambulatory Visit: Payer: PPO | Admitting: Internal Medicine

## 2023-04-22 ENCOUNTER — Encounter: Payer: PPO | Admitting: Nutrition

## 2023-04-22 ENCOUNTER — Ambulatory Visit: Payer: PPO

## 2023-04-22 ENCOUNTER — Encounter: Payer: Self-pay | Admitting: Internal Medicine

## 2023-04-22 ENCOUNTER — Other Ambulatory Visit: Payer: PPO

## 2023-04-22 NOTE — Progress Notes (Signed)
I confirmed with Lita Mains that the pt's second tissue block had been sent to [facility]. An email from [facility], [place] contact, confirmed the block had been received on 11/16 and is currently being processed.

## 2023-04-23 MED FILL — Fosaprepitant Dimeglumine For IV Infusion 150 MG (Base Eq): INTRAVENOUS | Qty: 5 | Status: AC

## 2023-04-24 ENCOUNTER — Other Ambulatory Visit: Payer: PPO

## 2023-04-24 ENCOUNTER — Inpatient Hospital Stay: Payer: PPO

## 2023-04-24 VITALS — BP 126/92 | HR 92 | Temp 97.8°F | Wt 160.2 lb

## 2023-04-24 DIAGNOSIS — C3490 Malignant neoplasm of unspecified part of unspecified bronchus or lung: Secondary | ICD-10-CM

## 2023-04-24 DIAGNOSIS — R918 Other nonspecific abnormal finding of lung field: Secondary | ICD-10-CM | POA: Diagnosis not present

## 2023-04-24 DIAGNOSIS — I2609 Other pulmonary embolism with acute cor pulmonale: Secondary | ICD-10-CM | POA: Diagnosis not present

## 2023-04-24 DIAGNOSIS — R0902 Hypoxemia: Secondary | ICD-10-CM | POA: Diagnosis not present

## 2023-04-24 DIAGNOSIS — R0602 Shortness of breath: Secondary | ICD-10-CM | POA: Diagnosis not present

## 2023-04-24 DIAGNOSIS — C55 Malignant neoplasm of uterus, part unspecified: Secondary | ICD-10-CM | POA: Diagnosis not present

## 2023-04-24 DIAGNOSIS — E041 Nontoxic single thyroid nodule: Secondary | ICD-10-CM | POA: Diagnosis not present

## 2023-04-24 DIAGNOSIS — I2699 Other pulmonary embolism without acute cor pulmonale: Secondary | ICD-10-CM | POA: Diagnosis not present

## 2023-04-24 LAB — CBC WITH DIFFERENTIAL (CANCER CENTER ONLY)
Abs Immature Granulocytes: 0.09 10*3/uL — ABNORMAL HIGH (ref 0.00–0.07)
Basophils Absolute: 0 10*3/uL (ref 0.0–0.1)
Basophils Relative: 1 %
Eosinophils Absolute: 0 10*3/uL (ref 0.0–0.5)
Eosinophils Relative: 0 %
HCT: 33.6 % — ABNORMAL LOW (ref 36.0–46.0)
Hemoglobin: 10.6 g/dL — ABNORMAL LOW (ref 12.0–15.0)
Immature Granulocytes: 1 %
Lymphocytes Relative: 12 %
Lymphs Abs: 1 10*3/uL (ref 0.7–4.0)
MCH: 28 pg (ref 26.0–34.0)
MCHC: 31.5 g/dL (ref 30.0–36.0)
MCV: 88.7 fL (ref 80.0–100.0)
Monocytes Absolute: 0.7 10*3/uL (ref 0.1–1.0)
Monocytes Relative: 9 %
Neutro Abs: 6.2 10*3/uL (ref 1.7–7.7)
Neutrophils Relative %: 77 %
Platelet Count: 384 10*3/uL (ref 150–400)
RBC: 3.79 MIL/uL — ABNORMAL LOW (ref 3.87–5.11)
RDW: 15.3 % (ref 11.5–15.5)
WBC Count: 8 10*3/uL (ref 4.0–10.5)
nRBC: 0 % (ref 0.0–0.2)

## 2023-04-24 LAB — CMP (CANCER CENTER ONLY)
ALT: 16 U/L (ref 0–44)
AST: 25 U/L (ref 15–41)
Albumin: 3.6 g/dL (ref 3.5–5.0)
Alkaline Phosphatase: 128 U/L — ABNORMAL HIGH (ref 38–126)
Anion gap: 9 (ref 5–15)
BUN: 17 mg/dL (ref 8–23)
CO2: 27 mmol/L (ref 22–32)
Calcium: 9.8 mg/dL (ref 8.9–10.3)
Chloride: 99 mmol/L (ref 98–111)
Creatinine: 0.94 mg/dL (ref 0.44–1.00)
GFR, Estimated: >60 mL/min (ref >60)
Glucose, Bld: 142 mg/dL — ABNORMAL HIGH (ref 70–99)
Potassium: 4.2 mmol/L (ref 3.5–5.1)
Sodium: 135 mmol/L (ref 135–145)
Total Bilirubin: 0.4 mg/dL (ref <1.2)
Total Protein: 7.3 g/dL (ref 6.5–8.1)

## 2023-04-24 LAB — GUARDANT 360

## 2023-04-24 LAB — TOTAL PROTEIN, URINE DIPSTICK: Protein, ur: NEGATIVE mg/dL

## 2023-04-24 MED ORDER — SODIUM CHLORIDE 0.9 % IV SOLN: INTRAVENOUS | Status: DC

## 2023-04-24 MED ORDER — FAMOTIDINE IN NACL 20-0.9 MG/50ML-% IV SOLN
20.0000 mg | Freq: Once | INTRAVENOUS | Status: AC
  Administered 2023-04-24: 20 mg via INTRAVENOUS
  Filled 2023-04-24: qty 50

## 2023-04-24 MED ORDER — PALONOSETRON HCL INJECTION 0.25 MG/5ML
0.2500 mg | Freq: Once | INTRAVENOUS | Status: AC
  Administered 2023-04-24: 0.25 mg via INTRAVENOUS
  Filled 2023-04-24: qty 5

## 2023-04-24 MED ORDER — SODIUM CHLORIDE 0.9 % IV SOLN
15.0000 mg/kg | Freq: Once | INTRAVENOUS | Status: AC
  Administered 2023-04-24: 1100 mg via INTRAVENOUS
  Filled 2023-04-24: qty 32

## 2023-04-24 MED ORDER — DIPHENHYDRAMINE HCL 50 MG/ML IJ SOLN
25.0000 mg | Freq: Once | INTRAMUSCULAR | Status: AC
  Administered 2023-04-24: 25 mg via INTRAVENOUS
  Filled 2023-04-24: qty 1

## 2023-04-24 MED ORDER — DEXAMETHASONE SODIUM PHOSPHATE 10 MG/ML IJ SOLN
10.0000 mg | Freq: Once | INTRAMUSCULAR | Status: AC
  Administered 2023-04-24: 10 mg via INTRAVENOUS
  Filled 2023-04-24: qty 1

## 2023-04-24 MED ORDER — CARBOPLATIN CHEMO INJECTION 450 MG/45ML
390.0000 mg | Freq: Once | INTRAVENOUS | Status: AC
  Administered 2023-04-24: 390 mg via INTRAVENOUS
  Filled 2023-04-24: qty 39

## 2023-04-24 MED ORDER — SODIUM CHLORIDE 0.9 % IV SOLN
500.0000 mg/m2 | Freq: Once | INTRAVENOUS | Status: AC
  Administered 2023-04-24: 900 mg via INTRAVENOUS
  Filled 2023-04-24: qty 20

## 2023-04-24 MED ORDER — SODIUM CHLORIDE 0.9 % IV SOLN
150.0000 mg | Freq: Once | INTRAVENOUS | Status: AC
  Administered 2023-04-24: 150 mg via INTRAVENOUS
  Filled 2023-04-24: qty 150

## 2023-04-24 NOTE — Patient Instructions (Signed)
[facility] - A DEPT OF [facility]  Discharge Instructions: Thank you for choosing [facility] to provide your oncology and hematology care.   If you have a lab appointment with the Cancer Center, please go directly to the Cancer Center and check in at the registration area.   Wear comfortable clothing and clothing appropriate for easy access to any Portacath or PICC line.   We strive to give you quality time with your provider. You may need to reschedule your appointment if you arrive late (15 or more minutes).  Arriving late affects you and other patients whose appointments are after yours.  Also, if you miss three or more appointments without notifying the office, you may be dismissed from the clinic at the provider's discretion.      For prescription refill requests, have your pharmacy contact our office and allow 72 hours for refills to be completed.    Today you received the following chemotherapy and/or immunotherapy agents :  Bevacizumab, Pemetrexed, & Carboplatin      To help prevent nausea and vomiting after your treatment, we encourage you to take your nausea medication as directed.  BELOW ARE SYMPTOMS THAT SHOULD BE REPORTED IMMEDIATELY: *FEVER GREATER THAN 100.4 F (38 C) OR HIGHER *CHILLS OR SWEATING *NAUSEA AND VOMITING THAT IS NOT CONTROLLED WITH YOUR NAUSEA MEDICATION *UNUSUAL SHORTNESS OF BREATH *UNUSUAL BRUISING OR BLEEDING *URINARY PROBLEMS (pain or burning when urinating, or frequent urination) *BOWEL PROBLEMS (unusual diarrhea, constipation, pain near the anus) TENDERNESS IN MOUTH AND THROAT WITH OR WITHOUT PRESENCE OF ULCERS (sore throat, sores in mouth, or a toothache) UNUSUAL RASH, SWELLING OR PAIN  UNUSUAL VAGINAL DISCHARGE OR ITCHING   Items with * indicate a potential emergency and should be followed up as soon as possible or go to the Emergency Department if any problems should occur.  Please show the  CHEMOTHERAPY ALERT CARD or IMMUNOTHERAPY ALERT CARD at check-in to the Emergency Department and triage nurse.  Should you have questions after your visit or need to cancel or reschedule your appointment, please contact [facility] - A DEPT OF [facility]  Dept: [phone]  and follow the prompts.  Office hours are 8:00 a.m. to 4:30 p.m. Monday - Friday. Please note that voicemails left after 4:00 p.m. may not be returned until the following business day.  We are closed weekends and major holidays. You have access to a nurse at all times for urgent questions. Please call the main number to the clinic Dept: [phone] and follow the prompts.   For any non-urgent questions, you may also contact your provider using MyChart. We now offer e-Visits for anyone 73 and older to request care online for non-urgent symptoms. For details visit [email or web address].   Also download the MyChart app! Go to the app store, search "MyChart", open the app, select , and log in with your MyChart username and password.  Bevacizumab Injection What is this medication? BEVACIZUMAB (be va SIZ yoo mab) treats some types of cancer. It works by blocking a protein that causes cancer cells to grow and multiply. This helps to slow or stop the spread of cancer cells. It is a monoclonal antibody. This medicine may be used for other purposes; ask your health care provider or pharmacist if you have questions. COMMON BRAND NAME(S): Alymsys, Avastin, MVASI, Omer Jack What should I tell my care team before I take this medication? They need to know if you  have any of these conditions: Blood clots Coughing up blood Having or recent surgery Heart failure High blood pressure History of a connection between 2 or more body parts that do not usually connect (fistula) History of a tear in your stomach or intestines Protein in your urine An unusual or allergic reaction to bevacizumab, other  medications, foods, dyes, or preservatives Pregnant or trying to get pregnant Breast-feeding How should I use this medication? This medication is injected into a vein. It is given by your care team in a hospital or clinic setting. Talk to your care team the use of this medication in children. Special care may be needed. Overdosage: If you think you have taken too much of this medicine contact a poison control center or emergency room at once. NOTE: This medicine is only for you. Do not share this medicine with others. What if I miss a dose? Keep appointments for follow-up doses. It is important not to miss your dose. Call your care team if you are unable to keep an appointment. What may interact with this medication? Interactions are not expected. This list may not describe all possible interactions. Give your health care provider a list of all the medicines, herbs, non-prescription drugs, or dietary supplements you use. Also tell them if you smoke, drink alcohol, or use illegal drugs. Some items may interact with your medicine. What should I watch for while using this medication? Your condition will be monitored carefully while you are receiving this medication. You may need blood work while taking this medication. This medication may make you feel generally unwell. This is not uncommon as chemotherapy can affect healthy cells as well as cancer cells. Report any side effects. Continue your course of treatment even though you feel ill unless your care team tells you to stop. This medication may increase your risk to bruise or bleed. Call your care team if you notice any unusual bleeding. Before having surgery, talk to your care team to make sure it is ok. This medication can increase the risk of poor healing of your surgical site or wound. You will need to stop this medication for 28 days before surgery. After surgery, wait at least 28 days before restarting this medication. Make sure the surgical  site or wound is healed enough before restarting this medication. Talk to your care team if questions. Talk to your care team if you may be pregnant. Serious birth defects can occur if you take this medication during pregnancy and for 6 months after the last dose. Contraception is recommended while taking this medication and for 6 months after the last dose. Your care team can help you find the option that works for you. Do not breastfeed while taking this medication and for 6 months after the last dose. This medication can cause infertility. Talk to your care team if you are concerned about your fertility. What side effects may I notice from receiving this medication? Side effects that you should report to your care team as soon as possible: Allergic reactions--skin rash, itching, hives, swelling of the face, lips, tongue, or throat Bleeding--bloody or black, tar-like stools, vomiting blood or brown material that looks like coffee grounds, red or dark brown urine, small red or purple spots on skin, unusual bruising or bleeding Blood clot--pain, swelling, or warmth in the leg, shortness of breath, chest pain Heart attack--pain or tightness in the chest, shoulders, arms, or jaw, nausea, shortness of breath, cold or clammy skin, feeling faint or lightheaded Heart failure--shortness  of breath, swelling of the ankles, feet, or hands, sudden weight gain, unusual weakness or fatigue Increase in blood pressure Infection--fever, chills, cough, sore throat, wounds that don't heal, pain or trouble when passing urine, general feeling of discomfort or being unwell Infusion reactions--chest pain, shortness of breath or trouble breathing, feeling faint or lightheaded Kidney injury--decrease in the amount of urine, swelling of the ankles, hands, or feet Stomach pain that is severe, does not go away, or gets worse Stroke--sudden numbness or weakness of the face, arm, or leg, trouble speaking, confusion, trouble  walking, loss of balance or coordination, dizziness, severe headache, change in vision Sudden and severe headache, confusion, change in vision, seizures, which may be signs of posterior reversible encephalopathy syndrome (PRES) Side effects that usually do not require medical attention (report to your care team if they continue or are bothersome): Back pain Change in taste Diarrhea Dry skin Increased tears Nosebleed This list may not describe all possible side effects. Call your doctor for medical advice about side effects. You may report side effects to FDA at 1-800-FDA-1088. Where should I keep my medication? This medication is given in a hospital or clinic. It will not be stored at home. NOTE: This sheet is a summary. It may not cover all possible information. If you have questions about this medicine, talk to your doctor, pharmacist, or health care provider.  2024 Elsevier/Gold Standard (2021-10-06 00:00:00)  Pemetrexed Injection What is this medication? PEMETREXED (PEM e TREX ed) treats some types of cancer. It works by slowing down the growth of cancer cells. This medicine may be used for other purposes; ask your health care provider or pharmacist if you have questions. COMMON BRAND NAME(S): Alimta, PEMFEXY, PEMRYDI RTU What should I tell my care team before I take this medication? They need to know if you have any of these conditions: Infection, such as chickenpox, cold sores, or herpes Kidney disease Low blood cell levels (white cells, red cells, and platelets) Lung or breathing disease, such as asthma Radiation therapy An unusual or allergic reaction to pemetrexed, other medications, foods, dyes, or preservatives If you or your partner are pregnant or trying to get pregnant Breast-feeding How should I use this medication? This medication is injected into a vein. It is given by your care team in a hospital or clinic setting. Talk to your care team about the use of this  medication in children. Special care may be needed. Overdosage: If you think you have taken too much of this medicine contact a poison control center or emergency room at once. NOTE: This medicine is only for you. Do not share this medicine with others. What if I miss a dose? Keep appointments for follow-up doses. It is important not to miss your dose. Call your care team if you are unable to keep an appointment. What may interact with this medication? Do not take this medication with any of the following: Live virus vaccines This medication may also interact with the following: Ibuprofen This list may not describe all possible interactions. Give your health care provider a list of all the medicines, herbs, non-prescription drugs, or dietary supplements you use. Also tell them if you smoke, drink alcohol, or use illegal drugs. Some items may interact with your medicine. What should I watch for while using this medication? Your condition will be monitored carefully while you are receiving this medication. This medication may make you feel generally unwell. This is not uncommon as chemotherapy can affect healthy cells as well  as cancer cells. Report any side effects. Continue your course of treatment even though you feel ill unless your care team tells you to stop. This medication can cause serious side effects. To reduce the risk, your care team may give you other medications to take before receiving this one. Be sure to follow the directions from your care team. This medication can cause a rash or redness in areas of the body that have previously had radiation therapy. If you have had radiation therapy, tell your care team if you notice a rash in this area. This medication may increase your risk of getting an infection. Call your care team for advice if you get a fever, chills, sore throat, or other symptoms of a cold or flu. Do not treat yourself. Try to avoid being around people who are sick. Be  careful brushing or flossing your teeth or using a toothpick because you may get an infection or bleed more easily. If you have any dental work done, tell your dentist you are receiving this medication. Avoid taking medications that contain aspirin, acetaminophen, ibuprofen, naproxen, or ketoprofen unless instructed by your care team. These medications may hide a fever. Check with your care team if you have severe diarrhea, nausea, and vomiting, or if you sweat a lot. The loss of too much body fluid may make it dangerous for you to take this medication. Talk to your care team if you or your partner wish to become pregnant or think either of you might be pregnant. This medication can cause serious birth defects if taken during pregnancy and for 6 months after the last dose. A negative pregnancy test is required before starting this medication. A reliable form of contraception is recommended while taking this medication and for 6 months after the last dose. Talk to your care team about reliable forms of contraception. Do not father a child while taking this medication and for 3 months after the last dose. Use a condom while having sex during this time period. Do not breastfeed while taking this medication and for 1 week after the last dose. This medication may cause infertility. Talk to your care team if you are concerned about your fertility. What side effects may I notice from receiving this medication? Side effects that you should report to your care team as soon as possible: Allergic reactions--skin rash, itching, hives, swelling of the face, lips, tongue, or throat Dry cough, shortness of breath or trouble breathing Infection--fever, chills, cough, sore throat, wounds that don't heal, pain or trouble when passing urine, general feeling of discomfort or being unwell Kidney injury--decrease in the amount of urine, swelling of the ankles, hands, or feet Low red blood cell level--unusual weakness or  fatigue, dizziness, headache, trouble breathing Redness, blistering, peeling, or loosening of the skin, including inside the mouth Unusual bruising or bleeding Side effects that usually do not require medical attention (report to your care team if they continue or are bothersome): Fatigue Loss of appetite Nausea Vomiting This list may not describe all possible side effects. Call your doctor for medical advice about side effects. You may report side effects to FDA at 1-800-FDA-1088. Where should I keep my medication? This medication is given in a hospital or clinic. It will not be stored at home. NOTE: This sheet is a summary. It may not cover all possible information. If you have questions about this medicine, talk to your doctor, pharmacist, or health care provider.  2024 Elsevier/Gold Standard (2021-09-26 00:00:00) Carboplatin Injection What  is this medication? CARBOPLATIN (KAR boe pla tin) treats some types of cancer. It works by slowing down the growth of cancer cells. This medicine may be used for other purposes; ask your health care provider or pharmacist if you have questions. COMMON BRAND NAME(S): Paraplatin What should I tell my care team before I take this medication? They need to know if you have any of these conditions: Blood disorders Hearing problems Kidney disease Recent or ongoing radiation therapy An unusual or allergic reaction to carboplatin, cisplatin, other medications, foods, dyes, or preservatives Pregnant or trying to get pregnant Breast-feeding How should I use this medication? This medication is injected into a vein. It is given by your care team in a hospital or clinic setting. Talk to your care team about the use of this medication in children. Special care may be needed. Overdosage: If you think you have taken too much of this medicine contact a poison control center or emergency room at once. NOTE: This medicine is only for you. Do not share this medicine  with others. What if I miss a dose? Keep appointments for follow-up doses. It is important not to miss your dose. Call your care team if you are unable to keep an appointment. What may interact with this medication? Medications for seizures Some antibiotics, such as amikacin, gentamicin, neomycin, streptomycin, tobramycin Vaccines This list may not describe all possible interactions. Give your health care provider a list of all the medicines, herbs, non-prescription drugs, or dietary supplements you use. Also tell them if you smoke, drink alcohol, or use illegal drugs. Some items may interact with your medicine. What should I watch for while using this medication? Your condition will be monitored carefully while you are receiving this medication. You may need blood work while taking this medication. This medication may make you feel generally unwell. This is not uncommon, as chemotherapy can affect healthy cells as well as cancer cells. Report any side effects. Continue your course of treatment even though you feel ill unless your care team tells you to stop. In some cases, you may be given additional medications to help with side effects. Follow all directions for their use. This medication may increase your risk of getting an infection. Call your care team for advice if you get a fever, chills, sore throat, or other symptoms of a cold or flu. Do not treat yourself. Try to avoid being around people who are sick. Avoid taking medications that contain aspirin, acetaminophen, ibuprofen, naproxen, or ketoprofen unless instructed by your care team. These medications may hide a fever. Be careful brushing or flossing your teeth or using a toothpick because you may get an infection or bleed more easily. If you have any dental work done, tell your dentist you are receiving this medication. Talk to your care team if you wish to become pregnant or think you might be pregnant. This medication can cause serious  birth defects. Talk to your care team about effective forms of contraception. Do not breast-feed while taking this medication. What side effects may I notice from receiving this medication? Side effects that you should report to your care team as soon as possible: Allergic reactions--skin rash, itching, hives, swelling of the face, lips, tongue, or throat Infection--fever, chills, cough, sore throat, wounds that don't heal, pain or trouble when passing urine, general feeling of discomfort or being unwell Low red blood cell level--unusual weakness or fatigue, dizziness, headache, trouble breathing Pain, tingling, or numbness in the hands or feet, muscle  weakness, change in vision, confusion or trouble speaking, loss of balance or coordination, trouble walking, seizures Unusual bruising or bleeding Side effects that usually do not require medical attention (report to your care team if they continue or are bothersome): Hair loss Nausea Unusual weakness or fatigue Vomiting This list may not describe all possible side effects. Call your doctor for medical advice about side effects. You may report side effects to FDA at 1-800-FDA-1088. Where should I keep my medication? This medication is given in a hospital or clinic. It will not be stored at home. NOTE: This sheet is a summary. It may not cover all possible information. If you have questions about this medicine, talk to your doctor, pharmacist, or health care provider.  2024 Elsevier/Gold Standard (2021-09-12 00:00:00)

## 2023-04-25 ENCOUNTER — Other Ambulatory Visit: Payer: Self-pay

## 2023-04-25 ENCOUNTER — Inpatient Hospital Stay (HOSPITAL_COMMUNITY)
Admission: EM | Admit: 2023-04-25 | Discharge: 2023-05-05 | DRG: 208 | Disposition: E | Payer: PPO | Attending: Pulmonary Disease | Admitting: Pulmonary Disease

## 2023-04-25 ENCOUNTER — Inpatient Hospital Stay (HOSPITAL_COMMUNITY): Payer: PPO

## 2023-04-25 ENCOUNTER — Encounter (HOSPITAL_COMMUNITY): Payer: Self-pay

## 2023-04-25 ENCOUNTER — Emergency Department (HOSPITAL_COMMUNITY): Payer: PPO

## 2023-04-25 DIAGNOSIS — I2609 Other pulmonary embolism with acute cor pulmonale: Principal | ICD-10-CM | POA: Diagnosis present

## 2023-04-25 DIAGNOSIS — F0394 Unspecified dementia, unspecified severity, with anxiety: Secondary | ICD-10-CM | POA: Diagnosis present

## 2023-04-25 DIAGNOSIS — Z9221 Personal history of antineoplastic chemotherapy: Secondary | ICD-10-CM | POA: Diagnosis not present

## 2023-04-25 DIAGNOSIS — Z79899 Other long term (current) drug therapy: Secondary | ICD-10-CM

## 2023-04-25 DIAGNOSIS — I2699 Other pulmonary embolism without acute cor pulmonale: Secondary | ICD-10-CM | POA: Diagnosis not present

## 2023-04-25 DIAGNOSIS — Z90710 Acquired absence of both cervix and uterus: Secondary | ICD-10-CM | POA: Diagnosis not present

## 2023-04-25 DIAGNOSIS — I959 Hypotension, unspecified: Secondary | ICD-10-CM | POA: Diagnosis not present

## 2023-04-25 DIAGNOSIS — R578 Other shock: Secondary | ICD-10-CM | POA: Diagnosis not present

## 2023-04-25 DIAGNOSIS — R0602 Shortness of breath: Secondary | ICD-10-CM

## 2023-04-25 DIAGNOSIS — J9601 Acute respiratory failure with hypoxia: Secondary | ICD-10-CM | POA: Diagnosis present

## 2023-04-25 DIAGNOSIS — W19XXXA Unspecified fall, initial encounter: Secondary | ICD-10-CM | POA: Diagnosis present

## 2023-04-25 DIAGNOSIS — Z803 Family history of malignant neoplasm of breast: Secondary | ICD-10-CM

## 2023-04-25 DIAGNOSIS — R7989 Other specified abnormal findings of blood chemistry: Secondary | ICD-10-CM | POA: Diagnosis present

## 2023-04-25 DIAGNOSIS — R001 Bradycardia, unspecified: Secondary | ICD-10-CM | POA: Diagnosis not present

## 2023-04-25 DIAGNOSIS — I468 Cardiac arrest due to other underlying condition: Secondary | ICD-10-CM | POA: Diagnosis not present

## 2023-04-25 DIAGNOSIS — I469 Cardiac arrest, cause unspecified: Secondary | ICD-10-CM

## 2023-04-25 DIAGNOSIS — R059 Cough, unspecified: Secondary | ICD-10-CM | POA: Diagnosis not present

## 2023-04-25 DIAGNOSIS — F0393 Unspecified dementia, unspecified severity, with mood disturbance: Secondary | ICD-10-CM | POA: Diagnosis not present

## 2023-04-25 DIAGNOSIS — F329 Major depressive disorder, single episode, unspecified: Secondary | ICD-10-CM | POA: Diagnosis present

## 2023-04-25 DIAGNOSIS — C7951 Secondary malignant neoplasm of bone: Secondary | ICD-10-CM | POA: Diagnosis not present

## 2023-04-25 DIAGNOSIS — F411 Generalized anxiety disorder: Secondary | ICD-10-CM | POA: Diagnosis present

## 2023-04-25 DIAGNOSIS — R0902 Hypoxemia: Secondary | ICD-10-CM | POA: Diagnosis not present

## 2023-04-25 DIAGNOSIS — C55 Malignant neoplasm of uterus, part unspecified: Secondary | ICD-10-CM | POA: Diagnosis not present

## 2023-04-25 DIAGNOSIS — Z923 Personal history of irradiation: Secondary | ICD-10-CM

## 2023-04-25 DIAGNOSIS — Z8542 Personal history of malignant neoplasm of other parts of uterus: Secondary | ICD-10-CM

## 2023-04-25 DIAGNOSIS — Z1152 Encounter for screening for COVID-19: Secondary | ICD-10-CM

## 2023-04-25 DIAGNOSIS — Z818 Family history of other mental and behavioral disorders: Secondary | ICD-10-CM

## 2023-04-25 DIAGNOSIS — Z801 Family history of malignant neoplasm of trachea, bronchus and lung: Secondary | ICD-10-CM | POA: Diagnosis not present

## 2023-04-25 DIAGNOSIS — E872 Acidosis, unspecified: Secondary | ICD-10-CM | POA: Diagnosis not present

## 2023-04-25 DIAGNOSIS — R0689 Other abnormalities of breathing: Secondary | ICD-10-CM | POA: Diagnosis not present

## 2023-04-25 DIAGNOSIS — Z825 Family history of asthma and other chronic lower respiratory diseases: Secondary | ICD-10-CM

## 2023-04-25 DIAGNOSIS — R000 Tachycardia, unspecified: Secondary | ICD-10-CM | POA: Diagnosis not present

## 2023-04-25 DIAGNOSIS — R109 Unspecified abdominal pain: Secondary | ICD-10-CM | POA: Diagnosis not present

## 2023-04-25 DIAGNOSIS — Z8249 Family history of ischemic heart disease and other diseases of the circulatory system: Secondary | ICD-10-CM

## 2023-04-25 DIAGNOSIS — C3490 Malignant neoplasm of unspecified part of unspecified bronchus or lung: Secondary | ICD-10-CM | POA: Diagnosis not present

## 2023-04-25 DIAGNOSIS — E041 Nontoxic single thyroid nodule: Secondary | ICD-10-CM | POA: Diagnosis not present

## 2023-04-25 DIAGNOSIS — G629 Polyneuropathy, unspecified: Secondary | ICD-10-CM | POA: Diagnosis present

## 2023-04-25 DIAGNOSIS — D630 Anemia in neoplastic disease: Secondary | ICD-10-CM | POA: Diagnosis present

## 2023-04-25 DIAGNOSIS — Z823 Family history of stroke: Secondary | ICD-10-CM

## 2023-04-25 DIAGNOSIS — I6782 Cerebral ischemia: Secondary | ICD-10-CM | POA: Diagnosis not present

## 2023-04-25 DIAGNOSIS — C781 Secondary malignant neoplasm of mediastinum: Secondary | ICD-10-CM | POA: Diagnosis present

## 2023-04-25 DIAGNOSIS — Z96651 Presence of right artificial knee joint: Secondary | ICD-10-CM | POA: Diagnosis present

## 2023-04-25 DIAGNOSIS — C7971 Secondary malignant neoplasm of right adrenal gland: Secondary | ICD-10-CM | POA: Diagnosis present

## 2023-04-25 DIAGNOSIS — R918 Other nonspecific abnormal finding of lung field: Secondary | ICD-10-CM | POA: Diagnosis not present

## 2023-04-25 DIAGNOSIS — R0603 Acute respiratory distress: Secondary | ICD-10-CM | POA: Diagnosis not present

## 2023-04-25 DIAGNOSIS — R32 Unspecified urinary incontinence: Secondary | ICD-10-CM | POA: Diagnosis present

## 2023-04-25 DIAGNOSIS — E559 Vitamin D deficiency, unspecified: Secondary | ICD-10-CM | POA: Diagnosis present

## 2023-04-25 DIAGNOSIS — C3492 Malignant neoplasm of unspecified part of left bronchus or lung: Secondary | ICD-10-CM | POA: Diagnosis not present

## 2023-04-25 LAB — COMPREHENSIVE METABOLIC PANEL
ALT: 28 U/L (ref 0–44)
AST: 41 U/L (ref 15–41)
Albumin: 3.2 g/dL — ABNORMAL LOW (ref 3.5–5.0)
Alkaline Phosphatase: 124 U/L (ref 38–126)
Anion gap: 11 (ref 5–15)
BUN: 18 mg/dL (ref 8–23)
CO2: 24 mmol/L (ref 22–32)
Calcium: 9 mg/dL (ref 8.9–10.3)
Chloride: 99 mmol/L (ref 98–111)
Creatinine, Ser: 0.97 mg/dL (ref 0.44–1.00)
GFR, Estimated: 60 mL/min — ABNORMAL LOW (ref >60)
Glucose, Bld: 109 mg/dL — ABNORMAL HIGH (ref 70–99)
Potassium: 4.5 mmol/L (ref 3.5–5.1)
Sodium: 134 mmol/L — ABNORMAL LOW (ref 135–145)
Total Bilirubin: 0.7 mg/dL (ref <1.2)
Total Protein: 7.3 g/dL (ref 6.5–8.1)

## 2023-04-25 LAB — MRSA NEXT GEN BY PCR, NASAL: MRSA by PCR Next Gen: NOT DETECTED

## 2023-04-25 LAB — RESP PANEL BY RT-PCR (RSV, FLU A&B, COVID)  RVPGX2
Influenza A by PCR: NEGATIVE
Influenza B by PCR: NEGATIVE
Resp Syncytial Virus by PCR: NEGATIVE
SARS Coronavirus 2 by RT PCR: NEGATIVE

## 2023-04-25 LAB — GLUCOSE, CAPILLARY: Glucose-Capillary: 122 mg/dL — ABNORMAL HIGH (ref 70–99)

## 2023-04-25 LAB — CBC
HCT: 34.7 % — ABNORMAL LOW (ref 36.0–46.0)
Hemoglobin: 10.7 g/dL — ABNORMAL LOW (ref 12.0–15.0)
MCH: 27.8 pg (ref 26.0–34.0)
MCHC: 30.8 g/dL (ref 30.0–36.0)
MCV: 90.1 fL (ref 80.0–100.0)
Platelets: 335 10*3/uL (ref 150–400)
RBC: 3.85 MIL/uL — ABNORMAL LOW (ref 3.87–5.11)
RDW: 15.9 % — ABNORMAL HIGH (ref 11.5–15.5)
WBC: 10.3 10*3/uL (ref 4.0–10.5)
nRBC: 0 % (ref 0.0–0.2)

## 2023-04-25 LAB — ECHOCARDIOGRAM COMPLETE
Height: 65 in
S' Lateral: 3.3 cm
Weight: 2617.3 [oz_av]

## 2023-04-25 LAB — BRAIN NATRIURETIC PEPTIDE: B Natriuretic Peptide: 213 pg/mL — ABNORMAL HIGH (ref 0.0–100.0)

## 2023-04-25 LAB — TROPONIN I (HIGH SENSITIVITY)
Troponin I (High Sensitivity): 1452 ng/L (ref <18)
Troponin I (High Sensitivity): 3016 ng/L (ref <18)
Troponin I (High Sensitivity): 3014 ng/L (ref <18)

## 2023-04-25 LAB — PROTIME-INR
INR: 1.3 — ABNORMAL HIGH (ref 0.8–1.2)
Prothrombin Time: 16.4 s — ABNORMAL HIGH (ref 11.4–15.2)

## 2023-04-25 LAB — APTT: aPTT: 172 s (ref 24–36)

## 2023-04-25 LAB — LACTIC ACID, PLASMA
Lactic Acid, Venous: 2.9 mmol/L (ref 0.5–1.9)
Lactic Acid, Venous: 3.3 mmol/L (ref 0.5–1.9)
Lactic Acid, Venous: 2.8 mmol/L (ref 0.5–1.9)

## 2023-04-25 MED ORDER — LACTATED RINGERS IV BOLUS
1000.0000 mL | Freq: Once | INTRAVENOUS | Status: AC
  Administered 2023-04-25: 1000 mL via INTRAVENOUS

## 2023-04-25 MED ORDER — SODIUM CHLORIDE 0.9% IV SOLUTION: Freq: Once | INTRAVENOUS | Status: DC

## 2023-04-25 MED ORDER — SODIUM CHLORIDE 0.9 % IV BOLUS
1000.0000 mL | Freq: Once | INTRAVENOUS | Status: AC
  Administered 2023-04-25: 1000 mL via INTRAVENOUS

## 2023-04-25 MED ORDER — HEPARIN BOLUS VIA INFUSION
4500.0000 [IU] | Freq: Once | INTRAVENOUS | Status: AC
  Administered 2023-04-25: 4500 [IU] via INTRAVENOUS

## 2023-04-25 MED ORDER — ALTEPLASE (PULMONARY EMBOLISM) INFUSION
50.0000 mg | Freq: Once | INTRAVENOUS | Status: DC
  Filled 2023-04-25: qty 50

## 2023-04-25 MED ORDER — LACTATED RINGERS IV SOLN: INTRAVENOUS | Status: DC

## 2023-04-25 MED ORDER — HEPARIN (PORCINE) 25000 UT/250ML-% IV SOLN
1250.0000 [IU]/h | INTRAVENOUS | Status: DC
  Administered 2023-04-25: 1250 [IU]/h via INTRAVENOUS
  Filled 2023-04-25: qty 250

## 2023-04-25 MED ORDER — ROCURONIUM BROMIDE 10 MG/ML (PF) SYRINGE
PREFILLED_SYRINGE | INTRAVENOUS | Status: AC
  Filled 2023-04-25: qty 10

## 2023-04-25 MED ORDER — OXYBUTYNIN CHLORIDE 5 MG PO TABS
15.0000 mg | ORAL_TABLET | Freq: Every day | ORAL | Status: DC
  Administered 2023-04-25: 15 mg via ORAL
  Filled 2023-04-25 (×2): qty 3

## 2023-04-25 MED ORDER — NOREPINEPHRINE 4 MG/250ML-% IV SOLN
INTRAVENOUS | Status: AC
  Filled 2023-04-25: qty 250

## 2023-04-25 MED ORDER — DONEPEZIL HCL 5 MG PO TABS
5.0000 mg | ORAL_TABLET | Freq: Every day | ORAL | Status: DC
  Administered 2023-04-25: 5 mg via ORAL
  Filled 2023-04-25 (×3): qty 1

## 2023-04-25 MED ORDER — HYDROMORPHONE HCL 1 MG/ML IJ SOLN
0.5000 mg | Freq: Once | INTRAMUSCULAR | Status: AC
  Administered 2023-04-25: 0.5 mg via INTRAVENOUS
  Filled 2023-04-25: qty 0.5

## 2023-04-25 MED ORDER — DOCUSATE SODIUM 100 MG PO CAPS: 100.0000 mg | ORAL_CAPSULE | Freq: Two times a day (BID) | ORAL | Status: DC | PRN

## 2023-04-25 MED ORDER — ETOMIDATE 2 MG/ML IV SOLN
INTRAVENOUS | Status: AC
  Filled 2023-04-25: qty 20

## 2023-04-25 MED ORDER — POLYETHYLENE GLYCOL 3350 17 G PO PACK: 17.0000 g | PACK | Freq: Every day | ORAL | Status: DC | PRN

## 2023-04-25 MED ORDER — IOHEXOL 350 MG/ML SOLN
75.0000 mL | Freq: Once | INTRAVENOUS | Status: AC | PRN
  Administered 2023-04-25: 75 mL via INTRAVENOUS

## 2023-04-25 MED ORDER — EPINEPHRINE HCL 5 MG/250ML IV SOLN IN NS
INTRAVENOUS | Status: AC
  Filled 2023-04-25: qty 250

## 2023-04-25 MED ORDER — SUCCINYLCHOLINE CHLORIDE 200 MG/10ML IV SOSY
PREFILLED_SYRINGE | INTRAVENOUS | Status: AC
  Filled 2023-04-25: qty 10

## 2023-04-25 MED ORDER — FENTANYL CITRATE PF 50 MCG/ML IJ SOSY
PREFILLED_SYRINGE | INTRAMUSCULAR | Status: AC
  Filled 2023-04-25: qty 2

## 2023-04-25 MED ORDER — SODIUM CHLORIDE 0.9 % IV SOLN: 250.0000 mL | Freq: Once | INTRAVENOUS | Status: DC

## 2023-04-25 MED ORDER — ASPIRIN 81 MG PO CHEW
162.0000 mg | CHEWABLE_TABLET | Freq: Once | ORAL | Status: AC
  Administered 2023-04-25: 162 mg via ORAL
  Filled 2023-04-25: qty 2

## 2023-04-25 MED ORDER — EPINEPHRINE HCL 5 MG/250ML IV SOLN IN NS: 0.5000 ug/min | INTRAVENOUS | Status: DC

## 2023-04-25 MED ORDER — ALTEPLASE (ACTIVASE) 10MG BOLUS + 40 MG INFUSION
50.0000 mg | Freq: Once | INTRAVENOUS | Status: AC
  Administered 2023-04-25: 50 mg via INTRAVENOUS
  Filled 2023-04-25: qty 50

## 2023-04-25 MED ORDER — NOREPINEPHRINE 4 MG/250ML-% IV SOLN: 0.0000 ug/min | INTRAVENOUS | Status: DC

## 2023-04-25 MED ORDER — HYDROMORPHONE HCL 1 MG/ML IJ SOLN: 0.5000 mg | INTRAMUSCULAR | Status: DC | PRN

## 2023-04-25 MED ORDER — VENLAFAXINE HCL ER 37.5 MG PO CP24
37.5000 mg | ORAL_CAPSULE | Freq: Every day | ORAL | Status: DC
  Filled 2023-04-25: qty 1

## 2023-04-25 MED ORDER — MIDAZOLAM HCL 2 MG/2ML IJ SOLN
INTRAMUSCULAR | Status: AC
  Filled 2023-04-25: qty 2

## 2023-04-25 MED ORDER — BUPROPION HCL ER (XL) 150 MG PO TB24
150.0000 mg | ORAL_TABLET | Freq: Every morning | ORAL | Status: DC
  Filled 2023-04-25: qty 1

## 2023-04-25 MED ORDER — GUAIFENESIN 100 MG/5ML PO LIQD: 5.0000 mL | ORAL | Status: DC | PRN

## 2023-04-25 MED ORDER — LACTATED RINGERS IV BOLUS: 1000.0000 mL | Freq: Once | INTRAVENOUS | Status: AC

## 2023-04-25 MED ORDER — GABAPENTIN 300 MG PO CAPS
400.0000 mg | ORAL_CAPSULE | Freq: Two times a day (BID) | ORAL | Status: DC
  Administered 2023-04-25: 400 mg via ORAL
  Filled 2023-04-25: qty 1

## 2023-04-25 NOTE — ED Notes (Signed)
Report given to [facility]. 

## 2023-04-25 NOTE — ED Triage Notes (Signed)
Pt BIB EMS for hypoxia and hypotension. Per EMS pt's oxygen 81% RA and 92% on 6L. Pt does have stage 4 lung cancer last tx was yesterday. Pt denies pain at this time.

## 2023-04-25 NOTE — ED Provider Notes (Signed)
Patient with a large pulmonary embolus causing  heart strain.  I spoke with Dr. Merrily Pew of pulmonary critical care and he recommends starting heparin and admitting the patient is over to [facility], ICU   Bethann Berkshire, MD 04/25/23 [phone]

## 2023-04-25 NOTE — ED Notes (Signed)
Patient transported to CT 

## 2023-04-25 NOTE — Progress Notes (Signed)
PHARMACY - ANTICOAGULATION CONSULT NOTE  Pharmacy Consult for heparin Indication: pulmonary embolus  Allergies  Allergen Reactions   Sulfa Antibiotics Hives    UNSPECIFIED REACTION    Sulfamethoxazole-Trimethoprim Other (See Comments)    Patient Measurements: Height: 5\' 5"  (165.1 cm) Weight: 74.2 kg (163 lb 9.3 oz) IBW/kg (Calculated) : 57 Heparin Dosing Weight: 72 kg  Vital Signs: Temp: 97.7 F (36.5 C) (11/21 1230) Temp Source: Oral (11/21 1230) BP: 104/70 (11/21 2000) Pulse Rate: 122 (11/21 2000)  Labs: Recent Labs    04/24/23 0813 04/25/23 1330 04/25/23 1518  HGB 10.6* 10.7*  --   HCT 33.6* 34.7*  --   PLT 384 335  --   CREATININE 0.94 0.97  --   TROPONINIHS  --  1,452* 3,016*    Estimated Creatinine Clearance: 48.2 mL/min (by C-G formula based on SCr of 0.97 mg/dL).   Assessment: Pharmacy consulted to dose heparin in patient with pulmonary embolism w/ right heart strain.  Patient is not on anticoagulation prior to admission. Heparin started at [facility].  Pt transferred to [facility] and decision made to give systemic tPA for PE - reduced dose of 50mg . Given ~2245. Heparin stopped prior to tPA being given.      Goal of Therapy:  Heparin level 0.3-0.5 units/ml x 24 hours post tPA then back to 0.3-0.7 units/ml Monitor platelets by anticoagulation protocol: Yes   Plan:  Check aPTT, PT/INR, CBC 30 min after tPA completion Start heparin 1000 units/hr (no bolus) when aPTT </= 80 sec  Christoper Fabian, PharmD, BCPS Please see amion for complete clinical pharmacist phone list 04/25/2023 9:32 PM

## 2023-04-25 NOTE — ED Provider Notes (Signed)
[facility]oe Kim EMERGENCY DEPARTMENT AT [facility] Provider Note   CSN: [id number] Arrival date & time: 04/21/2023  1222     History  Chief Complaint  Patient presents with   Shortness of Breath    Zoe Kim is a 77 y.o. female.  77 year old female with a history of metastatic lung cancer who presents emergency department shortness of breath.  History obtained predominantly through the patient's husband.  Reports that at approximately 9 AM noticed that his wife had become very short of breath.  No fevers, chest pain, or leg swelling recently.  Says that she has had some chills.  Typically does not wear oxygen at home and now is requiring 6 L nasal cannula.  Last received chemotherapy for her lung cancer yesterday.       Home Medications Prior to Admission medications   Medication Sig Start Date End Date Taking? Authorizing Provider  albuterol (VENTOLIN HFA) 108 (90 Base) MCG/ACT inhaler Inhale 2 puffs into the lungs 2 (two) times daily as needed. 02/20/23  Yes [provider]  Ascorbic Acid (VITAMIN C) 1000 MG tablet Take 1,000 mg by mouth daily.   Yes [provider]  benzonatate (TESSALON) 100 MG capsule Take 1 capsule (100 mg total) by mouth 3 (three) times daily as needed for cough. 01/29/23  Yes Tegeler, Canary Brim, MD  buPROPion (WELLBUTRIN XL) 150 MG 24 hr tablet Take 150 mg by mouth every morning. 10/10/22  Yes [provider]  buPROPion (WELLBUTRIN XL) 300 MG 24 hr tablet 1 tablet in the morning Orally Once a day for 90 days 10/10/22  Yes [provider]  Cholecalciferol (VITAMIN D) 50 MCG (2000 UT) CAPS Take 2,000 Units by mouth daily.   Yes [provider]  cyanocobalamin (VITAMIN B12) 500 MCG tablet Take 2,500 mcg by mouth daily. 12/28/22  Yes [provider]  desvenlafaxine (PRISTIQ) 50 MG 24 hr tablet Take 1 tablet (50 mg total) by mouth daily. 05/23/21  Yes Rakes, Doralee Albino, FNP  dexamethasone (DECADRON)  4 MG tablet Take 1 tab by mouth 2 times daily starting day before pemetrexed. Then take 2 tabs daily x 3 days starting day after chemo. Take with food. 04/22/23  Yes Si Gaul, MD  donepezil (ARICEPT) 10 MG tablet TAKE   1 TABLET DAILY Patient taking differently: Take 5 mg by mouth daily. TAKE   1 TABLET DAILY 01/24/23  Yes Marcos Eke, PA-C  folic acid (FOLVITE) 1 MG tablet Take 1 tablet (1 mg total) by mouth daily. Start 7 days before pemetrexed chemotherapy. Continue until 21 days after pemetrexed completed. 04/22/23  Yes Si Gaul, MD  gabapentin (NEURONTIN) 400 MG capsule Take 400 mg by mouth 2 (two) times daily. 2 capsules BID 04/23/22  Yes [provider]  Multiple Vitamins-Iron (MULTIVITAMINS WITH IRON) TABS tablet Take 1 tablet by mouth daily.   Yes [provider]  NON FORMULARY Take 1 tablet by mouth daily. Dynamic Brain vitamin   Yes [provider]  oxybutynin (DITROPAN) 5 MG tablet Take 15 mg by mouth daily.   Yes [provider]      Allergies    Sulfa antibiotics and Sulfamethoxazole-trimethoprim    Review of Systems   Review of Systems  Physical Exam Updated Vital Signs BP (!) 60/28   Pulse (!) 47   Temp 99.5 F (37.5 C)   Resp 20   Ht 5\' 5"  (1.651 m)   Wt 74.2 kg   SpO2 90%  BMI 27.22 kg/m  Physical Exam Vitals and nursing note reviewed.  Constitutional:      General: She is in acute distress.     Appearance: She is well-developed. She is ill-appearing.     Comments: On 6 L nasal cannula  HENT:     Head: Normocephalic and atraumatic.     Right Ear: External ear normal.     Left Ear: External ear normal.     Nose: Nose normal.  Eyes:     Extraocular Movements: Extraocular movements intact.     Conjunctiva/sclera: Conjunctivae normal.     Pupils: Pupils are equal, round, and reactive to light.  Cardiovascular:     Rate and Rhythm: Regular rhythm. Tachycardia present.     Heart sounds: No murmur  heard. Pulmonary:     Effort: Pulmonary effort is normal. No respiratory distress.     Breath sounds: Rhonchi (Left lower lobe) present.  Musculoskeletal:     Cervical back: Normal range of motion and neck supple.     Right lower leg: No edema.     Left lower leg: No edema.  Skin:    General: Skin is warm and dry.  Neurological:     Mental Status: She is alert and oriented to person, place, and time. Mental status is at baseline.  Psychiatric:        Mood and Affect: Mood normal.     ED Results / Procedures / Treatments   Labs (all labs ordered are listed, but only abnormal results are displayed) Labs Reviewed  COMPREHENSIVE METABOLIC PANEL - Abnormal; Notable for the following components:      Result Value   Sodium 134 (*)    Glucose, Bld 109 (*)    Albumin 3.2 (*)    GFR, Estimated 60 (*)    All other components within normal limits  CBC - Abnormal; Notable for the following components:   RBC 3.85 (*)    Hemoglobin 10.7 (*)    HCT 34.7 (*)    RDW 15.9 (*)    All other components within normal limits  BRAIN NATRIURETIC PEPTIDE - Abnormal; Notable for the following components:   B Natriuretic Peptide 213.0 (*)    All other components within normal limits  LACTIC ACID, PLASMA - Abnormal; Notable for the following components:   Lactic Acid, Venous 2.9 (*)    All other components within normal limits  LACTIC ACID, PLASMA - Abnormal; Notable for the following components:   Lactic Acid, Venous 3.3 (*)    All other components within normal limits  GLUCOSE, CAPILLARY - Abnormal; Notable for the following components:   Glucose-Capillary 122 (*)    All other components within normal limits  LACTIC ACID, PLASMA - Abnormal; Notable for the following components:   Lactic Acid, Venous 2.8 (*)    All other components within normal limits  APTT - Abnormal; Notable for the following components:   aPTT 172 (*)    All other components within normal limits  PROTIME-INR - Abnormal;  Notable for the following components:   Prothrombin Time 16.4 (*)    INR 1.3 (*)    All other components within normal limits  TROPONIN I (HIGH SENSITIVITY) - Abnormal; Notable for the following components:   Troponin I (High Sensitivity) 1,452 (*)    All other components within normal limits  TROPONIN I (HIGH SENSITIVITY) - Abnormal; Notable for the following components:   Troponin I (High Sensitivity) 3,016 (*)    All other components within  normal limits  TROPONIN I (HIGH SENSITIVITY) - Abnormal; Notable for the following components:   Troponin I (High Sensitivity) 3,014 (*)    All other components within normal limits  RESP PANEL BY RT-PCR (RSV, FLU A&B, COVID)  RVPGX2  CULTURE, BLOOD (ROUTINE X 2)  CULTURE, BLOOD (ROUTINE X 2)  MRSA NEXT GEN BY PCR, NASAL  BASIC METABOLIC PANEL  MAGNESIUM  PHOSPHORUS  COMPREHENSIVE METABOLIC PANEL  CBC  LACTIC ACID, PLASMA  BLOOD GAS, VENOUS  APTT  CBC  PROTIME-INR  PREPARE RBC (CROSSMATCH)  TYPE AND SCREEN  PREPARE RBC (CROSSMATCH)  TROPONIN I (HIGH SENSITIVITY)  TROPONIN I (HIGH SENSITIVITY)    EKG EKG Interpretation Date/Time:  Thursday April 25 2023 13:29:03 EST Ventricular Rate:  128 PR Interval:  159 QRS Duration:  90 QT Interval:  308 QTC Calculation: 450 R Axis:   -80  Text Interpretation: Sinus tachycardia Left anterior fascicular block Lateral infarct, age indeterminate Confirmed by Vonita Moss [phone]) on 05/02/2023 2:10:12 PM  Radiology CT HEAD WO CONTRAST ( )  Result Date: 04/09/2023 CLINICAL DATA:  Initial evaluation for mental status change, unknown cause. EXAM: CT HEAD WITHOUT CONTRAST TECHNIQUE: Contiguous axial images were obtained from the base of the skull through the vertex without intravenous contrast. RADIATION DOSE REDUCTION: This exam was performed according to the departmental dose-optimization program which includes automated exposure control, adjustment of the mA and/or kV according to  patient size and/or use of iterative reconstruction technique. COMPARISON:  Prior CT from 02/12/2023. FINDINGS: Brain: Cerebral volume within normal limits. Patchy hypodensity involving the supratentorial cerebral white matter, most characteristic of chronic microvascular ischemic disease, mild for age. No acute intracranial hemorrhage. No acute large vessel territory infarct. No mass lesion or midline shift. No hydrocephalus or extra-axial fluid collection. Vascular: No abnormal asymmetric hyperdense vessel. Scattered vascular calcifications noted within the carotid siphons. Skull: Scalp soft tissues demonstrate no acute finding. Calvarium intact. Sinuses/Orbits: Globes and orbital soft tissues within normal limits. Paranasal sinuses and mastoid air cells are largely clear. Other: None. IMPRESSION: 1. No acute intracranial abnormality. 2. Mild chronic microvascular ischemic disease for age. Electronically Signed   By: Rise Mu M.D.   On: 05/04/2023 21:22   ECHOCARDIOGRAM COMPLETE  Result Date: 04/10/2023    ECHOCARDIOGRAM REPORT   Patient Name:   JOHNELLE TATSCH Hatchel Date of Exam: 04/05/2023 Medical Rec #:  [id number]            Height:       65.0 in Accession #:    [id number]           Weight:       163.6 lb Date of Birth:  05/04/1946            BSA:          1.816 m Patient Age:    4 years             BP:           117/73 mmHg Patient Gender: F                    HR:           125 bpm. Exam Location:  Inpatient Procedure: 2D Echo, Cardiac Doppler and Color Doppler STAT ECHO Indications:    Acute resp distress R06.03  History:        Patient has prior history of Echocardiogram examinations, most  recent 02/12/2019. Pulmonary embolism.  Sonographer:    Harriette Bouillon RDCS Referring Phys: [id number] PETER E BABCOCK IMPRESSIONS  1. Large burden of thrombus in transit crossing tricuspid valve, in RV and RVOT, crossing pulmonary valve and seen in the main PA.  2. McConnell's sign (severely  reduced basal and mid RV function with preserved apical function), consistent with known PE. Right ventricular systolic function is severely reduced. The right ventricular size is severely enlarged. There is normal pulmonary artery systolic pressure. The estimated right ventricular systolic pressure is 33.0 mmHg.  3. Left ventricular ejection fraction, by estimation, is 65 to 70%. D-shaped septum. The left ventricle has normal function. The left ventricle has no regional wall motion abnormalities. Left ventricular diastolic parameters are indeterminate.  4. Right atrial size was mildly dilated.  5. The mitral valve is grossly normal. Trivial mitral valve regurgitation.  6. The aortic valve is grossly normal. There is mild calcification of the aortic valve. Aortic valve regurgitation is trivial. No aortic stenosis is present.  7. The inferior vena cava is dilated in size with <50% respiratory variability, suggesting right atrial pressure of 15 mmHg. Conclusion(s)/Recommendation(s): Critical findings reported to PCCM and acknowledged at 04/08/2023 9:15 pm. FINDINGS  Left Ventricle: Left ventricular ejection fraction, by estimation, is 65 to 70%. The left ventricle has normal function. The left ventricle has no regional wall motion abnormalities. The left ventricular internal cavity size was normal in size. There is  no left ventricular hypertrophy. Left ventricular diastolic parameters are indeterminate. Right Ventricle: McConnell's sign (severely reduced basal and mid RV function with preserved apical function), consistent with known PE. The right ventricular size is severely enlarged. No increase in right ventricular wall thickness. Right ventricular systolic function is severely reduced. There is normal pulmonary artery systolic pressure. The tricuspid regurgitant velocity is 2.12 m/s, and with an assumed right atrial pressure of 15 mmHg, the estimated right ventricular systolic pressure is 33.0 mmHg. Left Atrium:  Left atrial size was normal in size. Right Atrium: Right atrial size was mildly dilated. Pericardium: There is no evidence of pericardial effusion. Presence of epicardial fat layer. Mitral Valve: The mitral valve is grossly normal. Trivial mitral valve regurgitation. Tricuspid Valve: The tricuspid valve is grossly normal. Tricuspid valve regurgitation is mild . No evidence of tricuspid stenosis. Aortic Valve: The aortic valve is grossly normal. There is mild calcification of the aortic valve. Aortic valve regurgitation is trivial. No aortic stenosis is present. Pulmonic Valve: The pulmonic valve was not well visualized. Pulmonic valve regurgitation is trivial. No evidence of pulmonic stenosis. Aorta: The aortic root is normal in size and structure. Venous: The inferior vena cava is dilated in size with less than 50% respiratory variability, suggesting right atrial pressure of 15 mmHg. IAS/Shunts: The interatrial septum was not well visualized.  LEFT VENTRICLE PLAX 2D LVIDd:         3.90 cm LVIDs:         3.30 cm LV PW:         1.00 cm LV IVS:        1.00 cm LVOT diam:     2.00 cm LV SV:         34 LV SV Index:   19 LVOT Area:     3.14 cm  RIGHT VENTRICLE         IVC TAPSE (M-mode): 0.5 cm  IVC diam: 2.20 cm LEFT ATRIUM         Index  RIGHT ATRIUM           Index LA diam:    3.20 cm 1.76 cm/m  RA Area:     17.10 cm                                 RA Volume:   54.10 ml  29.79 ml/m  AORTIC VALVE LVOT Vmax:   82.20 cm/s LVOT Vmean:  59.300 cm/s LVOT VTI:    0.108 m  AORTA Ao Root diam: 3.00 cm Ao Asc diam:  3.60 cm TRICUSPID VALVE TR Peak grad:   18.0 mmHg TR Vmax:        212.00 cm/s  SHUNTS Systemic VTI:  0.11 m Systemic Diam: 2.00 cm Weston Brass MD Electronically signed by Weston Brass MD Signature Date/Time: 04/13/2023/9:17:48 PM    Final    CT Angio Chest PE W and/or Wo Contrast  Result Date: 04/23/2023 CLINICAL DATA:  Non-small-cell lung cancer. Uterine cancer. Shortness of breath EXAM: CT  ANGIOGRAPHY CHEST WITH CONTRAST TECHNIQUE: Multidetector CT imaging of the chest was performed using the standard protocol during bolus administration of intravenous contrast. Multiplanar CT image reconstructions and MIPs were obtained to evaluate the vascular anatomy. RADIATION DOSE REDUCTION: This exam was performed according to the departmental dose-optimization program which includes automated exposure control, adjustment of the mA and/or kV according to patient size and/or use of iterative reconstruction technique. CONTRAST:  75mL OMNIPAQUE IOHEXOL 350 MG/ML SOLN COMPARISON:  X-ray earlier 04/16/2023. Chest CT scan, angiogram 03/02/2023. FINDINGS: Cardiovascular: Heart is nonenlarged. No pericardial effusion. There is significant embolism identified along the right main pulmonary artery extending into the right with significant clot burden on the right side. Subtle areas are poorly enhancing. There is some reflux of contrast extending into the IVC. Slight flattening of the intraventricular septum. Please correlate for any clinical evidence of right heart strain. The left-sided pulmonary vessels centrally are compressed and displaced related to the known large left-sided lung mass. Mediastinum/Nodes: Slightly patulous thoracic esophagus. Right-sided low-attenuation thyroid nodule is seen measuring 16 mm, unchanged from previous. Please correlate with history. No specific abnormal lymph node enlargement identified in the axillary region or right hilum. There is some paratracheal nodes identified. Example on the left measures 17 x 9 mm. Unchanged from previous. Please correlate with the PET-CT scan of 04/03/2019. Lungs/Pleura: No consolidation, pneumothorax or effusion. There is a calcified right-sided lung nodule consistent with old granulomatous disease. Known large left-sided posterior hemithorax mass previously measuring up to 11.1 x 8.8 cm and today 11.5 by 9.3 cm on series 7, image 64. Please correlate with  prior workup for lung cancer. Due to its size there is significant mass effect and compression of surrounding structures. Upper Abdomen: The adrenal gland is preserved. There is a right adrenal nodule identified. Previously 2.9 cm in diameter and today 3.2 by 1.8 cm, minimally larger. Musculoskeletal: Curvature of the spine with degenerative changes. Multilevel stenosis related to disc bulging along the thoracic spine. Review of the MIP images confirms the above findings. Critical Value/emergent results were called by telephone at the time of interpretation on 05/03/2023 at 1:41 pm PST to provider Dr. Estell Harpin , who verbally acknowledged these results. IMPRESSION: Significant right-sided pulmonary emboli extending from the main pulmonary artery into the central right pulmonary artery, lobar and segmental branches. Moderate-to-large clot burden. There is also some signs of right heart strain. Please correlate with clinical presentation. Known left-sided large  lung mass for neoplasm with some prominent mediastinal nodes. Slight increase in size of the right adrenal nodule. Stable right thyroid low-attenuation nodule. Please correlate for any known history. Based on size a thyroid ultrasound follow up evaluation can be performed when clinically appropriate Aortic Atherosclerosis (ICD10-I70.0). Electronically Signed   By: Karen Kays M.D.   On: 04/20/2023 16:43   DG Chest Port 1 View  Result Date: 04/23/2023 CLINICAL DATA:  Shortness of breath. EXAM: PORTABLE CHEST 1 VIEW COMPARISON:  03/02/2023. FINDINGS: Redemonstration of a large (at least 10.8 x 13.9 cm), left lung mass, grossly similar to the prior study. Bilateral lung fields are otherwise clear. No acute consolidation or lung collapse. Bilateral costophrenic angles are clear. No pneumothorax. Stable cardio-mediastinal silhouette. No acute osseous abnormalities. The soft tissues are within normal limits. IMPRESSION: *Redemonstration of large left lung mass. No  acute cardiopulmonary abnormality. Electronically Signed   By: Jules Schick M.D.   On: 04/09/2023 14:21    Procedures Procedures    Medications Ordered in ED Medications  docusate sodium (COLACE) capsule 100 mg (has no administration in time range)  polyethylene glycol (MIRALAX / GLYCOLAX) packet 17 g (has no administration in time range)  lactated ringers infusion ( Intravenous Infusion Verify 04/24/2023 2300)  buPROPion (WELLBUTRIN XL) 24 hr tablet 150 mg (has no administration in time range)  venlafaxine XR (EFFEXOR-XR) 24 hr capsule 37.5 mg (has no administration in time range)  donepezil (ARICEPT) tablet 5 mg (5 mg Oral Given 04/08/2023 2255)  gabapentin (NEURONTIN) capsule 400 mg (400 mg Oral Given 04/20/2023 2300)  oxybutynin (DITROPAN) tablet 15 mg (15 mg Oral Given 05/03/2023 2256)  HYDROmorphone (DILAUDID) injection 0.5 mg (has no administration in time range)  guaiFENesin (ROBITUSSIN) 100 MG/5ML liquid 5 mL (has no administration in time range)  norepinephrine (LEVOPHED) 4-5 MG/250ML-% infusion SOLN (has no administration in time range)  0.9 %  sodium chloride infusion (Manually program via Guardrails IV Fluids) (has no administration in time range)  EPINEPHrine (ADRENALIN) 5 mg in NS 250 mL (0.02 mg/mL) premix infusion (has no administration in time range)  norepinephrine (LEVOPHED) 4mg  in (0.016 mg/mL) premix infusion (has no administration in time range)  EPINEPHrine NaCl 5-0.9 MG/250ML-% premix infusion (has no administration in time range)  0.9 %  sodium chloride infusion (has no administration in time range)  alteplase (ACTIVASE) 1 mg/mL infusion 50 mg (has no administration in time range)  vasopressin (PITRESSIN) 20 Units in 100 mL (0.2 unit/mL) infusion-*FOR SHOCK* (has no administration in time range)  fentaNYL 10 mcg/ml infusion (has no administration in time range)  lactated ringers bolus 1,000 mL (0 mLs Intravenous Stopped 04/23/2023 1555)  HYDROmorphone (DILAUDID)  injection 0.5 mg (0.5 mg Intravenous Given 04/27/2023 1501)  aspirin chewable tablet 162 mg (162 mg Oral Given 04/20/2023 1501)  iohexol (OMNIPAQUE) 350 MG/ML injection 75 mL (75 mLs Intravenous Contrast Given 05/03/2023 1531)  sodium chloride 0.9 % bolus 1,000 mL (0 mLs Intravenous Stopped 04/07/2023 1717)  heparin bolus via infusion 4,500 Units (4,500 Units Intravenous Bolus from Bag 04/13/2023 1725)  alteplase (ACTIVASE) 10 mg bolus + 40 mg infusion (50 mg Intravenous Given 04/11/2023 2239)  lactated ringers bolus 1,000 mL (0 mLs Intravenous Duplicate 04/23/2023 0453)    ED Course/ Medical Decision Making/ A&P Clinical Course as of 04/14/2023 1016  Thu Apr 25, 2023  1251 Full code verified with husband [RP]  1501 Dr Diona Browner does not feel that we should start her on heparin empirically with her cancer history.  Would recommend trending troponins and getting an echo. May benefit from admission to [facility].  [RP]  1541 Signed out to Dr Estell Harpin [RP]    Clinical Course User Index [RP] Rondel Baton, MD                                 Medical Decision Making Amount and/or Complexity of Data Reviewed Labs: ordered. Radiology: ordered.  Risk OTC drugs. Prescription drug management. Decision regarding hospitalization.   Shelise Cabanillas Kirley is a 77 y.o. female with comorbidities that complicate the patient evaluation including stage IV lung cancer who presented to the emergency department with shortness of breath  Initial Ddx:  PE, pleural effusion, pericardial effusion, pneumonia  MDM/Course:  Patient presented to the emergency department with acute onset of shortness of breath.  Does have a history of advanced cancer.  Is tachycardic on exam with abnormal breath sounds on the left side.  Also is newly hypoxic.  Initially was concerned about pulmonary embolism so CTA was ordered.  Has elevated troponin that was discussed with cardiology and they felt that should hold off on heparin from a ACS  standpoint but started if she does have a PE.  She had a chest x-ray which showed stable appearance of her mass.  She did not have any infiltrates that would be concerning for pneumonia.  Was given some IV fluids for her tachycardia.  Did not become hypotensive in the emergency department.  Signed out to the oncoming physician awaiting CTA.  This patient presents to the ED for concern of complaints listed in HPI, this involves an extensive number of treatment options, and is a complaint that carries with it a high risk of complications and morbidity. Disposition including potential need for admission considered.   Dispo: Pending remainder of workup  Additional history obtained from spouse Records reviewed Outpatient Clinic Notes The following labs were independently interpreted: Chemistry and show no acute abnormality I independently reviewed the following imaging with scope of interpretation limited to determining acute life threatening conditions related to emergency care: Chest x-ray and agree with the radiologist interpretation with the following exceptions: none I personally reviewed and interpreted cardiac monitoring: sinus tachycardia I personally reviewed and interpreted the pt's EKG: see above for interpretation  I have reviewed the patients home medications and made adjustments as needed Consults: Cardiology Social Determinants of health:  Elderly  Portions of this note were generated with Scientist, clinical (histocompatibility and immunogenetics). Dictation errors may occur despite best attempts at proofreading.     CRITICAL CARE Performed by: Rondel Baton   Total critical care time: 30 minutes  Critical care time was exclusive of separately billable procedures and treating other patients.  Critical care was necessary to treat or prevent imminent or life-threatening deterioration.  Critical care was time spent personally by me on the following activities: development of treatment plan with patient  and/or surrogate as well as nursing, discussions with consultants, evaluation of patient's response to treatment, examination of patient, obtaining history from patient or surrogate, ordering and performing treatments and interventions, ordering and review of laboratory studies, ordering and review of radiographic studies, pulse oximetry and re-evaluation of patient's condition.   Final Clinical Impression(s) / ED Diagnoses Final diagnoses:  Hypoxia  Shortness of breath  Acute pulmonary embolism with acute cor pulmonale, unspecified pulmonary embolism type (HCC)    Rx / DC Orders ED Discharge Orders     None  Rondel Baton, MD 05/01/2023 1016

## 2023-04-25 NOTE — Progress Notes (Signed)
PHARMACY - ANTICOAGULATION CONSULT NOTE  Pharmacy Consult for heparin Indication: pulmonary embolus  Allergies  Allergen Reactions   Sulfa Antibiotics Hives    UNSPECIFIED REACTION    Sulfamethoxazole-Trimethoprim Other (See Comments)    Patient Measurements: Height: 5\' 5"  (165.1 cm) Weight: 72.7 kg (160 lb 3 oz) IBW/kg (Calculated) : 57 Heparin Dosing Weight: 72 kg  Vital Signs: Temp: 97.7 F (36.5 C) (11/21 1230) Temp Source: Oral (11/21 1230) BP: 105/68 (11/21 1501) Pulse Rate: 125 (11/21 1631)  Labs: Recent Labs    04/24/23 0813 04/25/23 1330 04/25/23 1518  HGB 10.6* 10.7*  --   HCT 33.6* 34.7*  --   PLT 384 335  --   CREATININE 0.94 0.97  --   TROPONINIHS  --  1,452* 3,016*    Estimated Creatinine Clearance: 47.8 mL/min (by C-G formula based on SCr of 0.97 mg/dL).   Medical History: 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   Anemia due to antineoplastic chemotherapy 12/24/2018   Arthritis    Atherosclerosis of aorta 05/23/2021   Chronic fatigue syndrome 04/25/2021   Decreased estrogen level 04/25/2021   Family history of breast cancer    Family history of lung cancer    GAD (generalized anxiety disorder) 04/25/2021   Heart murmur    "years ago"   History of adenomatous polyp of colon 04/25/2021   History of endometrial cancer    Insomnia    Major depressive disorder 04/25/2021   Neoplasm of uncertain behavior of skin    Obesity 10/14/2018   Peripheral neuropathy due to chemotherapy 11/20/2018   Personal history of malignant neoplasm of other parts of uterus 04/25/2021   Pneumonia    "long time ago"   Primary localized osteoarthritis of right knee 07/30/2016   Urge incontinence of urine 04/25/2021   Vitamin D deficiency 04/25/2021    Medications:  (Not in a hospital admission)   Assessment: Pharmacy consulted to dose heparin in patient with pulmonary embolism w/ right  heart strain.  Patient is not on anticoagulation prior to admission.  CBC WNL Trop 3,016  Goal of Therapy:  Heparin level 0.3-0.7 units/ml Monitor platelets by anticoagulation protocol: Yes   Plan:  Give 4500 units bolus x 1 Start heparin infusion at 1250 units/hr Check anti-Xa level in 8 hours and daily while on heparin Continue to monitor H&H and platelets  Judeth Cornfield, PharmD Clinical Pharmacist 04/25/2023 5:02 PM

## 2023-04-25 NOTE — H&P (Signed)
NAME:  Zoe Kim, MRN:  [id number], DOB:  [birth year], LOS: 0 ADMISSION DATE:  04/25/2023, CONSULTATION DATE:  11/21 REFERRING MD:  [email or web address], CHIEF COMPLAINT:  acute pulmonary emboli    History of Present Illness:  This is a 2 yof w/ h/o dementia, prior endometrial cancer s/p s/p total hysterectomy, BSO, and SLB bx f/b radiation therapy and chemo and most recently Non-small cell lung cancer (adenocarcinoma presented large LLL lung mass)  stage IV w/ mets to left hilar, mediastinum, right adrenal gland and also area of concern involving right 6th rib.  Initially dx'd back in sept 24 at time of consult w/ onc pt exhibited concerns about dementia and palliative approach was recommended. The patient's husband elected to have her evaluated further. She is s/p her first round of chemo on 11/5 and again on 11/20 during which time she received: Pemetrexed, carboplatin, bevacizumab. During that visit not tachycardic, HR 90s nml BP Room air pulse ox 98% Presented to the ER on 11/21 around 9am became acutely short of breath. No chest pain, no fevers, no leg swelling. EMS called on arrival room air sats 81%, oxygen titrated rapidly and on arrival to ER was on 6 lpm w/ sats 92%. ER eval: sig right sided pulmonary emboli involving the main pulm artery into central right PA and lobar and segmental branchs. Large clot burden w/ some concern for RV strain. Additionally stable known left sided lung mass w/ mediastinal nodes and noted progression in size of adrenal metastasis  Additional diagnostics:  Neg COVID, nml wbc, hgb 10.7, BNP 213, Trop I 1452, lactate 2.9 Treatment in ER Placed on supplemental oxygen IV heparin started.  While in ER hr became more tachycardic, O2 needs progressed to 15 lpm. Lactate increased from 2.9 up to 3.3.  Given her clinical decline she is being referred for submassive pulmonary emboli w/ right heart strain and possibly catheter thrombectomy.  Pertinent  Medical History   Non-small cell lung cancer (adenocarcinoma presented large LLL lung mass)  stage IV w/ mets to left hilar, mediastinum, right adrenal gland and also area of concern involving right 6th rib. Currently getting systemic chemo w/ carboplatin, Alimta and Avastin every 3 weeks (first cycle was 11/5).  H/o endometrial cancer stage III s/p total hysterectomy, BSO, and SLB bx f/b radiation therapy and chemo Dementia  Depression Vit D def Peripheral neuropathy    Significant Hospital Events: Including procedures, antibiotic start and stop dates in addition to other pertinent events   11/21 admitted w/ acute pulmonary emboli w/ evidence of R heart strain. Has active malignancy would have otherwise met criteria for systemic lytics given O2 requirements, tachycardia, marked elevated trop I and clinical decline  but given malignancy not a candidate. Started on IV heparin, administered oxygen. Transferred to [facility] for potential IR eval for thrombectomy   Interim History / Subjective:  On 5L O2. Denies any dyspnea or chest pain. O2 sat low 90s, HR 115 - 120s. Denies prior VTE. Is complaining of posterior scalp pain and mild headache and admits to a mechanical fall earlier in the day before ED presentation at home when she "tripped". She has had the occasional fall at home but blames it on her dog getting in the way. Seems a little confused but she says this is normal for her (has mild cognitive impairment listed on PMH and does take Aricept).  No obvious deficits on neuro exam. Denies any hx weakness.  Objective   Blood pressure 103/72, pulse (!) 124,  temperature 97.7 F (36.5 C), temperature source Oral, resp. rate 17, height 5\' 5"  (1.651 m), weight 72.7 kg, SpO2 99%.       No intake or output data in the 24 hours ending 04/25/23 1729 Filed Weights   04/25/23 1249  Weight: 72.7 kg    Examination: General: Elderly female, resting in bed, in NAD. Neuro: A&O x 3 but sometimes a bit slow to respond,  says that its normal for her to be a little confused at times. MAE's, equal strength BUE and BLE. HEENT: [place]/AT. Sclerae anicteric. EOMI. Cardiovascular: Tachy, regular, no M/R/G.  Lungs: Respirations even and unlabored.  CTA bilaterally, No W/R/R. Abdomen: BS x 4, soft, NT/ND.  Musculoskeletal: No gross deformities, no edema.  Skin: Intact, warm, no rashes.   Assessment & Plan:   Acute hypoxic respiratory failure due to acute pulmonary embolus w/ evidence of right heart strain -provoked in setting of active malignancy  -PESI 148 (very high risk class V 10-24.5% 30d mortality) Plan Cont supplemental oxygen to maintain SpO2 > 92% Cont IV heparin for now Get stat ECHO and LE duplex Get stat CT head. No absolute contraindication to systemic lytics but certainly high risk. Vs cath directed  thrombectomy   Not a candidate for ECMO if she were to decline to that point  Sinus tachycardia - 2/2 above Plan Treat PE Avoid beta-blockade given compensatory component Gentle fluids  Recent mechanical fall with hx of occasional falls at home - attributes these to tripping over her dog. Now has some posterior scalp pain Plan STAT head CT as above  Subclinical obstructive shock w/ Lactic acidosis & elevated trop I Plan F/u on STAT echo Avoid hypovolemia  Treat PE Repeat lactate, trop MAP goal > 65   Non-small cell lung cancer stage IV  (adenocarcinoma presented large LLL lung mass) ECOG 1,  stage IV w/ mets to left hilar, mediastinum, right adrenal gland and also area of concern involving right 6th rib.  Plan F/u outpt heme-onc   H/o dementia, major depression,GAD and neuropathy (felt d/t prior chemo) Plan Cont home aricept, wellbutrin and neurontin  H/o urinary incont Plan Cont ditropan   Remote h/o endometrial cancer  Plan F/u OP as directed by onc    Best Practice (right click and "Reselect all SmartList Selections" daily)   Diet/type: NPO w/ oral meds DVT prophylaxis:  Heparin gtt for now Pressure ulcer(s): pressure ulcer assessment deferred  GI prophylaxis: N/A Lines: N/A Foley:  N/A Code Status:  full code Last date of multidisciplinary goals of care discussion: None yet  Labs   CBC: Recent Labs  Lab 04/24/23 0813 04/25/23 1330  WBC 8.0 10.3  NEUTROABS 6.2  --   HGB 10.6* 10.7*  HCT 33.6* 34.7*  MCV 88.7 90.1  PLT 384 335    Basic Metabolic Panel: Recent Labs  Lab 04/24/23 0813 04/25/23 1330  NA 135 134*  K 4.2 4.5  CL 99 99  CO2 27 24  GLUCOSE 142* 109*  BUN 17 18  CREATININE 0.94 0.97  CALCIUM 9.8 9.0   GFR: Estimated Creatinine Clearance: 47.8 mL/min (by C-G formula based on SCr of 0.97 mg/dL). Recent Labs  Lab 04/24/23 0813 04/25/23 1330 04/25/23 1518  WBC 8.0 10.3  --   LATICACIDVEN  --  2.9* 3.3*    Liver Function Tests: Recent Labs  Lab 04/24/23 0813 04/25/23 1330  AST 25 41  ALT 16 28  ALKPHOS 128* 124  BILITOT 0.4 0.7  PROT 7.3 7.3  ALBUMIN 3.6 3.2*   No results for input(s): "LIPASE", "AMYLASE" in the last 168 hours. No results for input(s): "AMMONIA" in the last 168 hours.  ABG No results found for: "PHART", "PCO2ART", "PO2ART", "HCO3", "TCO2", "ACIDBASEDEF", "O2SAT"   Coagulation Profile: No results for input(s): "INR", "PROTIME" in the last 168 hours.  Cardiac Enzymes: No results for input(s): "CKTOTAL", "CKMB", "CKMBINDEX", "TROPONINI" in the last 168 hours.  HbA1C: Hemoglobin A1C  Date/Time Value Ref Range Status  09/14/2015 11:51 AM 5.5  Final    CBG: No results for input(s): "GLUCAP" in the last 168 hours.  Review of Systems:   All negative; except for those that are bolded, which indicate positives.  Constitutional: weight loss, weight gain, night sweats, fevers, chills, fatigue, weakness.  HEENT: headaches, sore throat, sneezing, nasal congestion, post nasal drip, difficulty swallowing, tooth/dental problems, visual complaints, visual changes, ear aches. Neuro: difficulty with  speech, weakness, numbness, ataxia, intermittent confusion. CV:  chest pain, orthopnea, PND, swelling in lower extremities, dizziness, palpitations, syncope.  Resp: cough, hemoptysis, dyspnea, wheezing. GI: heartburn, indigestion, abdominal pain, nausea, vomiting, diarrhea, constipation, change in bowel habits, loss of appetite, hematemesis, melena, hematochezia.  GU: dysuria, change in color of urine, urgency or frequency, flank pain, hematuria. MSK: joint pain or swelling, decreased range of motion. Psych: change in mood or affect, depression, anxiety, suicidal ideations, homicidal ideations. Skin: rash, itching, bruising.   Past Medical History:  She,  has a past medical history of Amnestic MCI (mild cognitive impairment with memory loss) (08/30/2021), Anemia due to antineoplastic chemotherapy (12/24/2018), Arthritis, Atherosclerosis of aorta (05/23/2021), Chronic fatigue syndrome (04/25/2021), Decreased estrogen level (04/25/2021), Family history of breast cancer, Family history of lung cancer, GAD (generalized anxiety disorder) (04/25/2021), Heart murmur, History of adenomatous polyp of colon (04/25/2021), History of endometrial cancer, Insomnia, Major depressive disorder (04/25/2021), Neoplasm of uncertain behavior of skin, Obesity (10/14/2018), Peripheral neuropathy due to chemotherapy (11/20/2018), Personal history of malignant neoplasm of other parts of uterus (04/25/2021), Pneumonia, Primary localized osteoarthritis of right knee (07/30/2016), Urge incontinence of urine (04/25/2021), and Vitamin D deficiency (04/25/2021).   Surgical History:   Past Surgical History:  Procedure Laterality Date   COLONOSCOPY     EYE SURGERY Bilateral    cataract with lens   IR IMAGING GUIDED PORT INSERTION  11/06/2018   IR REMOVAL TUN ACCESS W/ PORT W/O FL MOD SED  02/13/2019   ROBOTIC ASSISTED TOTAL HYSTERECTOMY WITH BILATERAL SALPINGO OOPHERECTOMY N/A 10/14/2018   Procedure: ROBOTIC ASSISTED TOTAL  HYSTERECTOMY WITH BILATERAL SALPINGO OOPHORECTOMY;  Surgeon: Adolphus Birchwood, MD;  Location: [facility];  Service: Gynecology;  Laterality: N/A;   SENTINEL NODE BIOPSY N/A 10/14/2018   Procedure: SENTINEL LYMPH NODE BIOPSY;  Surgeon: Adolphus Birchwood, MD;  Location: [facility];  Service: Gynecology;  Laterality: N/A;   TONSILLECTOMY     TOOTH EXTRACTION Right 08/27/2017   TOTAL KNEE ARTHROPLASTY Right 07/30/2016   Procedure: TOTAL KNEE ARTHROPLASTY;  Surgeon: Salvatore Marvel, MD;  Location: [facility];  Service: Orthopedics;  Laterality: Right;   VEIN LIGATION AND STRIPPING       Social History:   reports that she has never smoked. She has never used smokeless tobacco. She reports that she does not drink alcohol and does not use drugs.   Family History:  Her family history includes Breast cancer in her mother; COPD in her brother and mother; Cancer in her maternal grandmother and mother; Cancer (age of onset: 68) in her brother; Dementia in her mother; Heart disease in her brother,  father, and mother; Stroke in her maternal grandfather.   Allergies Allergies  Allergen Reactions   Sulfa Antibiotics Hives    UNSPECIFIED REACTION    Sulfamethoxazole-Trimethoprim Other (See Comments)     Home Medications  Prior to Admission medications   Medication Sig Start Date End Date Taking? Authorizing Provider  albuterol (VENTOLIN HFA) 108 (90 Base) MCG/ACT inhaler Inhale 2 puffs into the lungs 2 (two) times daily as needed. 02/20/23  Yes [provider]  Ascorbic Acid (VITAMIN C) 1000 MG tablet Take 1,000 mg by mouth daily.   Yes [provider]  benzonatate (TESSALON) 100 MG capsule Take 1 capsule (100 mg total) by mouth 3 (three) times daily as needed for cough. 01/29/23  Yes Tegeler, Canary Brim, MD  buPROPion (WELLBUTRIN XL) 150 MG 24 hr tablet Take 150 mg by mouth every morning. 10/10/22  Yes [provider]  buPROPion (WELLBUTRIN XL) 300 MG 24 hr tablet 1 tablet in the morning Orally Once a  day for 90 days 10/10/22  Yes [provider]  Cholecalciferol (VITAMIN D) 50 MCG (2000 UT) CAPS Take 2,000 Units by mouth daily.   Yes [provider]  cyanocobalamin (VITAMIN B12) 500 MCG tablet Take 2,500 mcg by mouth daily. 12/28/22  Yes [provider]  desvenlafaxine (PRISTIQ) 50 MG 24 hr tablet Take 1 tablet (50 mg total) by mouth daily. 05/23/21  Yes Rakes, Doralee Albino, FNP  dexamethasone (DECADRON) 4 MG tablet Take 1 tab by mouth 2 times daily starting day before pemetrexed. Then take 2 tabs daily x 3 days starting day after chemo. Take with food. 04/22/23  Yes Si Gaul, MD  donepezil (ARICEPT) 10 MG tablet TAKE   1 TABLET DAILY Patient taking differently: Take 5 mg by mouth daily. TAKE   1 TABLET DAILY 01/24/23  Yes Marcos Eke, PA-C  folic acid (FOLVITE) 1 MG tablet Take 1 tablet (1 mg total) by mouth daily. Start 7 days before pemetrexed chemotherapy. Continue until 21 days after pemetrexed completed. 04/22/23  Yes Si Gaul, MD  gabapentin (NEURONTIN) 400 MG capsule Take 400 mg by mouth 2 (two) times daily. 2 capsules BID 04/23/22  Yes [provider]  Multiple Vitamins-Iron (MULTIVITAMINS WITH IRON) TABS tablet Take 1 tablet by mouth daily.   Yes [provider]  NON FORMULARY Take 1 tablet by mouth daily. Dynamic Brain vitamin   Yes [provider]  oxybutynin (DITROPAN) 5 MG tablet Take 15 mg by mouth daily.   Yes [provider]     Critical care time: 35 min.    Rutherford Guys, PA - [facility] [facility] For pager details, please see AMION or use Epic chat  After 1900, please call [facility] for cross coverage needs 04/25/2023, 8:36 PM

## 2023-04-25 NOTE — Progress Notes (Signed)
PCCM Interval Progress Note   Dr. Francine Graven and I met with pt at bedside as she was getting her echo. Echo showed dilated RV and what appeared like clot at the tricuspid valve. Head CT reviewed and per our read, no bleed or active masses/mets.   I called radiology and spoke with radiologist regarding the head CT and he agreed with normal CT (official read later returned supporting this).  Dr. Jacques Navy with cardiology also contacted [place] with echo report of large amount of thrombus in transit in the RV and RVOT, crossing both tricuspid and pulmonary valves. EF hyperdynamic with small and underfilled LV.   Given the constellation of findings, we discussed systemic thrombolysis with the pt and she is in agreement for 1/2 dose Alteplase. We reviewed risks and benefits, primarily bleeding of known left lung mass. She understands and is willing to proceed as she agrees that benefits outweigh risks.  Contraindications reviewed and pt confirms no recent bleeding, no recent CVA in past 6 months, no hx ICH, no known bleeding disorders, no major recent surgery.  She is not on any anticoagulation or antiplatelets at baseline.   Pt agreeable to proceed with 1/2 dose Alteplase. Pharmacy contacted and updated. RN updated and aware of plan. I also tried to call pt's husband, Annamarie Dawley at [phone] but there was no answer. I left a voicemail to call the MICU back to speak to RN if he wanted updates tonight, or to come in after 0800 tomorrow to speak with the rounding team.   Rutherford Guys, PA - [facility] [address] [facility] For pager details, please see AMION or use Epic chat  After 1900, please call [facility] for cross coverage needs 04/25/2023, 10:07 PM

## 2023-04-25 NOTE — ED Notes (Signed)
Nurse consulted RT and confirmed to leave pt on non rebreather

## 2023-04-25 NOTE — Progress Notes (Addendum)
eLink Physician-Brief Progress Note Patient Name: Zoe Kim DOB: 1945-09-28 MRN: [id number]   Date of Service  04/25/2023  HPI/Events of Note  77 year old with a history of stage IV non-small cell lung cancer, mild cognitive impairment, generalized anxiety disorder who initially presented via EMS for hypoxemia saturating 81% on room air and 92% on 6 L found to have an acute submassive pulmonary embolus.  On examination, patient is tachypneic, tachycardic, and normotensive saturating 95% on a nonrebreather.  Results are consistent with normal metabolic panel, rising troponin, rising lactic acid, mild anemia.  CT angiography with right-sided pulmonary embolus and right ventricular strain in addition to previously known clot.  Having increased occipital pain and confusion.  eICU Interventions  Stat CT head pending -preliminary negative for acute findings  Systemic anticoagulation with heparin  VTE treatment with heparin infusion GI prophylaxis not indicated   2304 -receiving alteplase.  Now has cough and abdominal pain.  Add back on as needed hydromorphone.  Monitor for bleeding or new neurological changes.  2330 -CODE BLUE called due to progressive bradycardia and subsequent cardiac arrest.  PEA arrest.  Team at bedside immediately.  2 units of emergent PRBC have been ordered.  CODE being directed by team at bedside. ROSC achieved, will order POST ROSC labs and CXR.  Intervention Category Evaluation Type: New Patient Evaluation  Sebastien Jackson 04/25/2023, 7:52 PM

## 2023-04-26 ENCOUNTER — Other Ambulatory Visit (HOSPITAL_COMMUNITY): Payer: PPO

## 2023-04-26 ENCOUNTER — Ambulatory Visit: Payer: PPO | Admitting: Physical Therapy

## 2023-04-26 ENCOUNTER — Encounter (HOSPITAL_COMMUNITY): Payer: PPO

## 2023-04-26 DIAGNOSIS — J9601 Acute respiratory failure with hypoxia: Secondary | ICD-10-CM

## 2023-04-26 DIAGNOSIS — I469 Cardiac arrest, cause unspecified: Secondary | ICD-10-CM | POA: Diagnosis not present

## 2023-04-26 DIAGNOSIS — I2699 Other pulmonary embolism without acute cor pulmonale: Secondary | ICD-10-CM | POA: Diagnosis not present

## 2023-04-26 DIAGNOSIS — R7989 Other specified abnormal findings of blood chemistry: Secondary | ICD-10-CM

## 2023-04-26 DIAGNOSIS — C3492 Malignant neoplasm of unspecified part of left bronchus or lung: Secondary | ICD-10-CM | POA: Diagnosis not present

## 2023-04-26 DIAGNOSIS — E872 Acidosis, unspecified: Secondary | ICD-10-CM

## 2023-04-26 LAB — PREPARE RBC (CROSSMATCH)

## 2023-04-26 MED ORDER — FENTANYL 2500MCG IN NS 250ML (10MCG/ML) PREMIX INFUSION
INTRAVENOUS | Status: AC
  Filled 2023-04-26: qty 250

## 2023-04-26 MED ORDER — VASOPRESSIN 20 UNITS/100 ML INFUSION FOR SHOCK: 0.0000 [IU]/min | INTRAVENOUS | Status: DC

## 2023-04-27 LAB — TYPE AND SCREEN
Unit division: 0
Unit division: 0

## 2023-04-27 LAB — BPAM RBC
Blood Product Expiration Date: 202412162359
Blood Product Expiration Date: 202412162359
ISSUE DATE / TIME: 202411212337
ISSUE DATE / TIME: 202411221605
Unit Type and Rh: 5100
Unit Type and Rh: 5100
Unit Type and Rh: 5100

## 2023-04-29 ENCOUNTER — Ambulatory Visit: Payer: PPO | Admitting: Internal Medicine

## 2023-04-29 ENCOUNTER — Other Ambulatory Visit: Payer: PPO

## 2023-04-29 ENCOUNTER — Ambulatory Visit: Payer: PPO

## 2023-04-29 ENCOUNTER — Encounter (HOSPITAL_COMMUNITY): Payer: Self-pay

## 2023-04-30 ENCOUNTER — Ambulatory Visit: Payer: PPO | Admitting: Physician Assistant

## 2023-04-30 ENCOUNTER — Other Ambulatory Visit: Payer: PPO

## 2023-04-30 ENCOUNTER — Ambulatory Visit: Payer: PPO

## 2023-04-30 LAB — CULTURE, BLOOD (ROUTINE X 2)
Culture: NO GROWTH
Culture: NO GROWTH
Special Requests: ADEQUATE
Special Requests: ADEQUATE

## 2023-05-05 NOTE — Progress Notes (Signed)
Left VM for husband - had not yet been able to make contact regarding death of patient. He called back. I explained the events and that patient was dead. Provided contact information for decedent services.

## 2023-05-05 NOTE — Death Summary Note (Signed)
DEATH SUMMARY   Patient Details  Name: Zoe Kim MRN: [id number] DOB: 03/15/1946  Admission/Discharge Information   Admit Date:  05-09-23  Date of Death: Date of Death: May 10, 2023  Time of Death: Time of Death: 0102  Length of Stay: 1  Referring Physician: Shon Hale, MD   Reason(s) for Hospitalization  Acute Pulmonary Embolism  Diagnoses  Preliminary cause of death:  Obstructive Shock due to pulmonary embolism  Secondary Diagnoses (including complications and co-morbidities):  Principal Problem:   Pulmonary embolism (HCC) Active Problems:   Cardiac arrest, cause unspecified (HCC) Non-small Cell Lung Cancer, metastatic to mediastinum and adrenal gland Obesity   Brief Hospital Course (including significant findings, care, treatment, and services provided and events leading to death)  Zoe Kim is a 77 y.o. year old female with history of dementia, prior endometrial cancer s/p s/p total hysterectomy, BSO, and SLB bx f/b radiation therapy and chemo and most recently Non-small cell lung cancer (adenocarcinoma presented large LLL lung mass)  stage IV w/ mets to left hilar, mediastinum, right adrenal gland and also area of concern involving right 6th rib. Initially dx'd back in sept 24 at time of consult w/ onc pt exhibited concerns about dementia and palliative approach was recommended. The patient's husband elected to have her evaluated further. She is s/p her first round of chemo on 11/5 and again on 11/20 during which time she received: Pemetrexed, carboplatin, bevacizumab. During that visit not tachycardic, HR 90s nml BP Room air pulse ox 98% Presented to the ER on 05/09/2023 around 9am became acutely short of breath. No chest pain, no fevers, no leg swelling. EMS called on arrival room air sats 81%, oxygen titrated rapidly and on arrival to ER was on 6 lpm w/ sats 92%.  ER eval: sig right sided pulmonary emboli involving the main pulm artery into central  right PA and lobar and segmental branchs. Large clot burden w/ some concern for RV strain. Additionally stable known left sided lung mass w/ mediastinal nodes and noted progression in size of adrenal metastasis   Additional diagnostics:  Neg COVID, nml wbc, hgb 10.7, BNP 213, Trop I 1452, lactate 2.9  Treatment in ER Placed on supplemental oxygen IV heparin started.  While in ER hr became more tachycardic, O2 needs progressed to 15 lpm. Lactate increased from 2.9 up to 3.3.  Given her clinical decline she is being referred for submassive pulmonary emboli w/ right heart strain and possibly catheter thrombectomy.   [facility]. Discussion had with patient regarding thrombolytic therapy. Echo was performed and cardiology notified critical care team of clot in transit in the right heart. Head CT was negative for acute pathology or metastatic disease. She gave consent to move forward with half dose thrombolytic therapy.   Patient went into cardiac arrest 30 minutes after TPA treatment, likely due to obstructive shock from the pulmonary emboli. CPR was performed for over 60 minutes, with second dose of TPA administered to complete full therapeutic dose. She would regain sinus tachycardia rhythm but would later develop bradycardia and loss of pulse with PEA activity. Despite resuscitative efforts, decision made to stop code and patient passed away at 1:02am on May 10, 2023.     Pertinent Labs and Studies  Significant Diagnostic Studies CT HEAD WO CONTRAST ( )  Result Date: May 09, 2023 CLINICAL DATA:  Initial evaluation for mental status change, unknown cause. EXAM: CT HEAD WITHOUT CONTRAST TECHNIQUE: Contiguous axial images were obtained from the base of  the skull through the vertex without intravenous contrast. RADIATION DOSE REDUCTION: This exam was performed according to the departmental dose-optimization program which includes automated exposure control, adjustment of  the mA and/or kV according to patient size and/or use of iterative reconstruction technique. COMPARISON:  Prior CT from 02/12/2023. FINDINGS: Brain: Cerebral volume within normal limits. Patchy hypodensity involving the supratentorial cerebral white matter, most characteristic of chronic microvascular ischemic disease, mild for age. No acute intracranial hemorrhage. No acute large vessel territory infarct. No mass lesion or midline shift. No hydrocephalus or extra-axial fluid collection. Vascular: No abnormal asymmetric hyperdense vessel. Scattered vascular calcifications noted within the carotid siphons. Skull: Scalp soft tissues demonstrate no acute finding. Calvarium intact. Sinuses/Orbits: Globes and orbital soft tissues within normal limits. Paranasal sinuses and mastoid air cells are largely clear. Other: None. IMPRESSION: 1. No acute intracranial abnormality. 2. Mild chronic microvascular ischemic disease for age. Electronically Signed   By: Rise Mu M.D.   On: 04/28/2023 21:22   ECHOCARDIOGRAM COMPLETE  Result Date: 04/17/2023    ECHOCARDIOGRAM REPORT   Patient Name:   Zoe Kim Kho Date of Exam: 04/28/2023 Medical Rec #:  [id number]            Height:       65.0 in Accession #:    [id number]           Weight:       163.6 lb Date of Birth:  07-24-1945            BSA:          1.816 m Patient Age:    26 years             BP:           117/73 mmHg Patient Gender: F                    HR:           125 bpm. Exam Location:  Inpatient Procedure: 2D Echo, Cardiac Doppler and Color Doppler STAT ECHO Indications:    Acute resp distress R06.03  History:        Patient has prior history of Echocardiogram examinations, most                 recent 02/12/2019. Pulmonary embolism.  Sonographer:    Harriette Bouillon RDCS Referring Phys: [id number] PETER E BABCOCK IMPRESSIONS  1. Large burden of thrombus in transit crossing tricuspid valve, in RV and RVOT, crossing pulmonary valve and seen in the main PA.  2.  McConnell's sign (severely reduced basal and mid RV function with preserved apical function), consistent with known PE. Right ventricular systolic function is severely reduced. The right ventricular size is severely enlarged. There is normal pulmonary artery systolic pressure. The estimated right ventricular systolic pressure is 33.0 mmHg.  3. Left ventricular ejection fraction, by estimation, is 65 to 70%. D-shaped septum. The left ventricle has normal function. The left ventricle has no regional wall motion abnormalities. Left ventricular diastolic parameters are indeterminate.  4. Right atrial size was mildly dilated.  5. The mitral valve is grossly normal. Trivial mitral valve regurgitation.  6. The aortic valve is grossly normal. There is mild calcification of the aortic valve. Aortic valve regurgitation is trivial. No aortic stenosis is present.  7. The inferior vena cava is dilated in size with <50% respiratory variability, suggesting right atrial pressure of 15 mmHg. Conclusion(s)/Recommendation(s): Critical findings reported to PCCM and acknowledged at 04/21/2023 9:15  pm. FINDINGS  Left Ventricle: Left ventricular ejection fraction, by estimation, is 65 to 70%. The left ventricle has normal function. The left ventricle has no regional wall motion abnormalities. The left ventricular internal cavity size was normal in size. There is  no left ventricular hypertrophy. Left ventricular diastolic parameters are indeterminate. Right Ventricle: McConnell's sign (severely reduced basal and mid RV function with preserved apical function), consistent with known PE. The right ventricular size is severely enlarged. No increase in right ventricular wall thickness. Right ventricular systolic function is severely reduced. There is normal pulmonary artery systolic pressure. The tricuspid regurgitant velocity is 2.12 m/s, and with an assumed right atrial pressure of 15 mmHg, the estimated right ventricular systolic pressure is  33.0 mmHg. Left Atrium: Left atrial size was normal in size. Right Atrium: Right atrial size was mildly dilated. Pericardium: There is no evidence of pericardial effusion. Presence of epicardial fat layer. Mitral Valve: The mitral valve is grossly normal. Trivial mitral valve regurgitation. Tricuspid Valve: The tricuspid valve is grossly normal. Tricuspid valve regurgitation is mild . No evidence of tricuspid stenosis. Aortic Valve: The aortic valve is grossly normal. There is mild calcification of the aortic valve. Aortic valve regurgitation is trivial. No aortic stenosis is present. Pulmonic Valve: The pulmonic valve was not well visualized. Pulmonic valve regurgitation is trivial. No evidence of pulmonic stenosis. Aorta: The aortic root is normal in size and structure. Venous: The inferior vena cava is dilated in size with less than 50% respiratory variability, suggesting right atrial pressure of 15 mmHg. IAS/Shunts: The interatrial septum was not well visualized.  LEFT VENTRICLE PLAX 2D LVIDd:         3.90 cm LVIDs:         3.30 cm LV PW:         1.00 cm LV IVS:        1.00 cm LVOT diam:     2.00 cm LV SV:         34 LV SV Index:   19 LVOT Area:     3.14 cm  RIGHT VENTRICLE         IVC TAPSE (M-mode): 0.5 cm  IVC diam: 2.20 cm LEFT ATRIUM         Index       RIGHT ATRIUM           Index LA diam:    3.20 cm 1.76 cm/m  RA Area:     17.10 cm                                 RA Volume:   54.10 ml  29.79 ml/m  AORTIC VALVE LVOT Vmax:   82.20 cm/s LVOT Vmean:  59.300 cm/s LVOT VTI:    0.108 m  AORTA Ao Root diam: 3.00 cm Ao Asc diam:  3.60 cm TRICUSPID VALVE TR Peak grad:   18.0 mmHg TR Vmax:        212.00 cm/s  SHUNTS Systemic VTI:  0.11 m Systemic Diam: 2.00 cm Weston Brass MD Electronically signed by Weston Brass MD Signature Date/Time: 04/06/2023/9:17:48 PM    Final    CT Angio Chest PE W and/or Wo Contrast  Result Date: 04/23/2023 CLINICAL DATA:  Non-small-cell lung cancer. Uterine cancer. Shortness  of breath EXAM: CT ANGIOGRAPHY CHEST WITH CONTRAST TECHNIQUE: Multidetector CT imaging of the chest was performed using the standard protocol during bolus administration of intravenous contrast.  Multiplanar CT image reconstructions and MIPs were obtained to evaluate the vascular anatomy. RADIATION DOSE REDUCTION: This exam was performed according to the departmental dose-optimization program which includes automated exposure control, adjustment of the mA and/or kV according to patient size and/or use of iterative reconstruction technique. CONTRAST:  75mL OMNIPAQUE IOHEXOL 350 MG/ML SOLN COMPARISON:  X-ray earlier 04/10/2023. Chest CT scan, angiogram 03/02/2023. FINDINGS: Cardiovascular: Heart is nonenlarged. No pericardial effusion. There is significant embolism identified along the right main pulmonary artery extending into the right with significant clot burden on the right side. Subtle areas are poorly enhancing. There is some reflux of contrast extending into the IVC. Slight flattening of the intraventricular septum. Please correlate for any clinical evidence of right heart strain. The left-sided pulmonary vessels centrally are compressed and displaced related to the known large left-sided lung mass. Mediastinum/Nodes: Slightly patulous thoracic esophagus. Right-sided low-attenuation thyroid nodule is seen measuring 16 mm, unchanged from previous. Please correlate with history. No specific abnormal lymph node enlargement identified in the axillary region or right hilum. There is some paratracheal nodes identified. Example on the left measures 17 x 9 mm. Unchanged from previous. Please correlate with the PET-CT scan of 04/03/2019. Lungs/Pleura: No consolidation, pneumothorax or effusion. There is a calcified right-sided lung nodule consistent with old granulomatous disease. Known large left-sided posterior hemithorax mass previously measuring up to 11.1 x 8.8 cm and today 11.5 by 9.3 cm on series 7, image 64.  Please correlate with prior workup for lung cancer. Due to its size there is significant mass effect and compression of surrounding structures. Upper Abdomen: The adrenal gland is preserved. There is a right adrenal nodule identified. Previously 2.9 cm in diameter and today 3.2 by 1.8 cm, minimally larger. Musculoskeletal: Curvature of the spine with degenerative changes. Multilevel stenosis related to disc bulging along the thoracic spine. Review of the MIP images confirms the above findings. Critical Value/emergent results were called by telephone at the time of interpretation on 04/27/2023 at 1:41 pm PST to provider Dr. Estell Harpin , who verbally acknowledged these results. IMPRESSION: Significant right-sided pulmonary emboli extending from the main pulmonary artery into the central right pulmonary artery, lobar and segmental branches. Moderate-to-large clot burden. There is also some signs of right heart strain. Please correlate with clinical presentation. Known left-sided large lung mass for neoplasm with some prominent mediastinal nodes. Slight increase in size of the right adrenal nodule. Stable right thyroid low-attenuation nodule. Please correlate for any known history. Based on size a thyroid ultrasound follow up evaluation can be performed when clinically appropriate Aortic Atherosclerosis (ICD10-I70.0). Electronically Signed   By: Karen Kays M.D.   On: 04/07/2023 16:43   DG Chest Port 1 View  Result Date: 04/24/2023 CLINICAL DATA:  Shortness of breath. EXAM: PORTABLE CHEST 1 VIEW COMPARISON:  03/02/2023. FINDINGS: Redemonstration of a large (at least 10.8 x 13.9 cm), left lung mass, grossly similar to the prior study. Bilateral lung fields are otherwise clear. No acute consolidation or lung collapse. Bilateral costophrenic angles are clear. No pneumothorax. Stable cardio-mediastinal silhouette. No acute osseous abnormalities. The soft tissues are within normal limits. IMPRESSION: *Redemonstration of  large left lung mass. No acute cardiopulmonary abnormality. Electronically Signed   By: Jules Schick M.D.   On: 04/19/2023 14:21   NM PET Image Initial (PI) Skull Base To Thigh (F-18 FDG)  Result Date: 04/10/2023 CLINICAL DATA:  Initial treatment strategy for non small cell lung cancer. History of uterine cancer with radiation therapy and chemotherapy 4 years prior.  EXAM: NUCLEAR MEDICINE PET SKULL BASE TO THIGH TECHNIQUE: 8.0 mCi F-18 FDG was injected intravenously. Full-ring PET imaging was performed from the skull base to thigh after the radiotracer. CT data was obtained and used for attenuation correction and anatomic localization. Fasting blood glucose: 113 mg/dl COMPARISON:  None Available. FINDINGS: NECK: No hypermetabolic lymph nodes in the neck. Incidental CT findings: None. CHEST: Large LEFT lobe mass has intense peripheral metabolic activity consistent with malignancy and central necrosis. Mass measures 11.1 x 8.8 cm with SUV max equal 30.1 along the periphery of the mass (image 79). No hypermetabolic mediastinal lymph nodes Incidental CT findings: .Tiny calcified nodule in the RIGHT upper lobe on image 38 is benign. ABDOMEN/PELVIS: There is intense metabolic activity associated with enlarged RIGHT adrenal gland measuring 2.9 cm with SUV max equal 9.0 on image 101. LEFT adrenal gland normal. No evidence of liver metastasis. No metastatic adenopathy in the abdomen pelvis. Incidental CT findings: Atherosclerotic calcification of the aorta. No radiotracer activity associated with the vaginal cuff. SKELETON: Focus of metabolic activity associated with the posterior RIGHT sixth rib (image 69). No CT correlation. SUV max equal 5.0. Incidental CT findings: None. IMPRESSION: 1. Large LEFT upper lobe mass with intense peripheral metabolic activity consistent with primary bronchogenic carcinoma. 2. No evidence of metastatic adenopathy in the chest. 3. Intense metabolic activity associated with enlarged RIGHT  adrenal gland is consistent with adrenal metastasis. 4. Focus of metabolic activity associated with the posterior RIGHT sixth rib is indeterminate. Favor metastatic lesion. Electronically Signed   By: Genevive Bi M.D.   On: 04/10/2023 13:04    Microbiology Recent Results (from the past 240 hour(s))  Resp panel by RT-PCR (RSV, Flu A&B, Covid) Anterior Nasal Swab     Status: None   Collection Time: 04/05/2023 12:46 PM   Specimen: Anterior Nasal Swab  Result Value Ref Range Status   SARS Coronavirus 2 by RT PCR NEGATIVE NEGATIVE Final    Comment: (NOTE) SARS-CoV-2 target nucleic acids are NOT DETECTED.  The SARS-CoV-2 RNA is generally detectable in upper respiratory specimens during the acute phase of infection. The lowest concentration of SARS-CoV-2 viral copies this assay can detect is 138 copies/mL. A negative result does not preclude SARS-Cov-2 infection and should not be used as the sole basis for treatment or other patient management decisions. A negative result may occur with  improper specimen collection/handling, submission of specimen other than nasopharyngeal swab, presence of viral mutation(s) within the areas targeted by this assay, and inadequate number of viral copies(<138 copies/mL). A negative result must be combined with clinical observations, patient history, and epidemiological information. The expected result is Negative.  Fact Sheet for Patients:  BloggerCourse.com  Fact Sheet for Healthcare Providers:  SeriousBroker.it  This test is no t yet approved or cleared by the [place] FDA and  has been authorized for detection and/or diagnosis of SARS-CoV-2 by FDA under an Emergency Use Authorization (EUA). This EUA will remain  in effect (meaning this test can be used) for the duration of the COVID-19 declaration under Section 564(b)(1) of the Act, 21 U.S.C.section 360bbb-3(b)(1), unless the authorization is  terminated  or revoked sooner.       Influenza A by PCR NEGATIVE NEGATIVE Final   Influenza B by PCR NEGATIVE NEGATIVE Final    Comment: (NOTE) The Xpert Xpress SARS-CoV-2/FLU/RSV plus assay is intended as an aid in the diagnosis of influenza from Nasopharyngeal swab specimens and should not be used as a sole basis for treatment. Nasal  washings and aspirates are unacceptable for Xpert Xpress SARS-CoV-2/FLU/RSV testing.  Fact Sheet for Patients: BloggerCourse.com  Fact Sheet for Healthcare Providers: SeriousBroker.it  This test is not yet approved or cleared by the [place] FDA and has been authorized for detection and/or diagnosis of SARS-CoV-2 by FDA under an Emergency Use Authorization (EUA). This EUA will remain in effect (meaning this test can be used) for the duration of the COVID-19 declaration under Section 564(b)(1) of the Act, 21 U.S.C. section 360bbb-3(b)(1), unless the authorization is terminated or revoked.     Resp Syncytial Virus by PCR NEGATIVE NEGATIVE Final    Comment: (NOTE) Fact Sheet for Patients: BloggerCourse.com  Fact Sheet for Healthcare Providers: SeriousBroker.it  This test is not yet approved or cleared by the [place] FDA and has been authorized for detection and/or diagnosis of SARS-CoV-2 by FDA under an Emergency Use Authorization (EUA). This EUA will remain in effect (meaning this test can be used) for the duration of the COVID-19 declaration under Section 564(b)(1) of the Act, 21 U.S.C. section 360bbb-3(b)(1), unless the authorization is terminated or revoked.  Performed at [facility], [address] [place], [place] [address]   Blood culture (routine x 2)     Status: None   Collection Time: 04/21/2023  1:30 PM   Specimen: BLOOD  Result Value Ref Range Status   Specimen Description BLOOD BLOOD RIGHT ARM  Final   Special  Requests   Final    BOTTLES DRAWN AEROBIC AND ANAEROBIC Blood Culture adequate volume   Culture   Final    NO GROWTH 5 DAYS Performed at [facility], [address], [place], [place] [address]    Report Status 04/30/2023 FINAL  Final  Blood culture (routine x 2)     Status: None   Collection Time: 04/15/2023  1:30 PM   Specimen: BLOOD  Result Value Ref Range Status   Specimen Description BLOOD BLOOD RIGHT ARM  Final   Special Requests   Final    Blood Culture adequate volume BOTTLES DRAWN AEROBIC AND ANAEROBIC   Culture   Final    NO GROWTH 5 DAYS Performed at [facility], [address], [place], [place] [address]    Report Status 04/30/2023 FINAL  Final  MRSA Next Gen by PCR, Nasal     Status: None   Collection Time: 05/01/2023  7:35 PM   Specimen: Nasal Mucosa; Nasal Swab  Result Value Ref Range Status   MRSA by PCR Next Gen NOT DETECTED NOT DETECTED Final    Comment: (NOTE) The GeneXpert MRSA Assay (FDA approved for NASAL specimens only), is one component of a comprehensive MRSA colonization surveillance program. It is not intended to diagnose MRSA infection nor to guide or monitor treatment for MRSA infections. Test performance is not FDA approved in patients less than 76 years old. Performed at [facility], [address] [place], [place] [address]     Lab Basic Metabolic Panel: Recent Labs  Lab 04/24/23 0813 04/06/2023 1330  NA 135 134*  K 4.2 4.5  CL 99 99  CO2 27 24  GLUCOSE 142* 109*  BUN 17 18  CREATININE 0.94 0.97  CALCIUM 9.8 9.0   Liver Function Tests: Recent Labs  Lab 04/24/23 0813 04/28/2023 1330  AST 25 41  ALT 16 28  ALKPHOS 128* 124  BILITOT 0.4 0.7  PROT 7.3 7.3  ALBUMIN 3.6 3.2*   No results for input(s): "LIPASE", "AMYLASE" in the last 168 hours. No results  for input(s): "AMMONIA" in the last 168 hours. CBC: Recent Labs  Lab 04/24/23 0813 04/06/2023 1330  WBC 8.0 10.3  NEUTROABS 6.2  --   HGB 10.6* 10.7*  HCT 33.6* 34.7*   MCV 88.7 90.1  PLT 384 335   Cardiac Enzymes: No results for input(s): "CKTOTAL", "CKMB", "CKMBINDEX", "TROPONINI" in the last 168 hours. Sepsis Labs: Recent Labs  Lab 04/24/23 0813 04/19/2023 1330 04/28/2023 1518 04/18/2023 2100  WBC 8.0 10.3  --   --   LATICACIDVEN  --  2.9* 3.3* 2.8*    Procedures/Operations  Endotracheal Intubation CPR   Martina Sinner 04/30/2023, 7:47 AM

## 2023-05-05 NOTE — Therapy (Signed)
[facility] [address] [place], [place], [address] Phone: [phone]   Fax:  [phone]  Patient Details  Name: Zoe Kim MRN: [id number] Date of Birth: [birth year] Referring Provider:  No ref. provider found  Encounter Date: 04/10/2023  PHYSICAL THERAPY DISCHARGE SUMMARY  Visits from Start of Care: 2  Current functional level related to goals / functional outcomes: N/A   Remaining deficits: N/A   Education / Equipment: HEP initiated   Patient agrees to discharge. Patient goals were not met. Patient is being discharged due to a change in medical status.  Dion Body, PT 05/01/2023, 8:35 AM  [place] [address] [place], [place], [address] Phone: [phone]   Fax:  [phone]

## 2023-05-05 NOTE — Significant Event (Signed)
PCCM Interval Note  Called to bedside for emergent intubation.  At the time of mine and Dr. Cindi Carbon arrival, pt noted to be in PEA arrest. 1/2 of tPA had been infused. We continued infusion while following ACLS protocol with multiple rounds of epinephrine, bicarbonate, calcium. She was also started on 1u PRBC empirically in case of bleeding.  I intubated the pt on first attempt with assistance of RT and RN staff. Dr. Francine Graven placed an IO for additional access.  She would regain pulses intermittently but would then lose them again. At one point, she had VT for which she was defibrillated x 1 before she had what appeared to be Torsades. She subsequently received 2g Mag.  There were no obvious signs of bleeding; therefore, given code situation, we ordered the remaining 1/2 dose of tPA which she received as a bolus.  ACLS continued for another 30+ minutes (in total, over one hour) with additional epi, bicarb, calcium as well as multiple titrations of vasopressors including Epinephrine, Norepineprhine, Vasopressin. Dr. Francine Graven and I were at the bedside throughout and directing these titrations.  Unfortunately after one hour, she had recurrent PEA and at that point, we had maximized all efforts. Pt was pronounced at 0102.  Please see code sheet scanned into chart for full code details/meds/events.   Additional CC time: 70 minutes.  Rutherford Guys, PA - [facility] For pager details, please see AMION or use Epic chat  After 1900, please call [facility] for cross coverage needs 04/09/2023, 1:10 AM

## 2023-05-05 NOTE — Progress Notes (Addendum)
Pt arrived to [facility] with heparin gtt infusing. Pt BP 104/70, HR 120s  and SPO2 95% on 6L [place]. Pt alert but confused with reports of posterior headache. [facility] notified. Stat head CT ordered  Head CT complete. Ground team at bedside to evaluate pt.     2118 [facility] Notified of  cough  2304 Pt now reports abdominal pain and is having increased cough. [facility]ink notified.   Pt appears restless and stated that she is having chest pain. Pt subsequently went PEA with HR in 40s-50s. Code initiated. Late medication order placed by Physician due to emergent situation.  All medication drips (Epi,Levo,vaso, alteplase, and fentanyl)  titrated per verbal order from Francine Graven, MD who was present at bedside. See code sheet for additional medications given.

## 2023-05-05 NOTE — Progress Notes (Signed)
Attempted to call Annamarie Dawley (spouse) again at [phone] notified that the patient in past.  Unfortunately, he did not respond.  Will attempt again closer to 7.  Conrad [place], MD [facility]

## 2023-05-05 NOTE — Code Documentation (Signed)
CODE BLUE NOTE  Patient Name: Zoe Kim   MRN: [id number]   Date of Birth/ Sex: Jul 12, 1945 , female      Admission Date: 04/13/2023  Attending Provider: Martina Sinner, MD  Primary Diagnosis: Pulmonary embolism [facility])    Indication: I was notified at 11:30 PM that patient was declining and she was noted to be PEA arrest. Code blue was subsequently called. At the time of arrival on scene, ACLS protocol was underway.    Technical Description:  CPR performance duration:  1.25 hours  Was defibrillation or cardioversion used? Yes   Was external pacer placed? No  Was patient intubated pre/post CPR? Yes    Medications Administered: Y = Yes; Blank = No Amiodarone    Atropine    Calcium  Y  Epinephrine  Y  Lidocaine    Magnesium  Y  Norepinephrine  Y  Phenylephrine    Sodium bicarbonate  Y  Vasopressin  Y   50mg  of TPA were given during code to complete full dose of TPA therapy.   Post CPR evaluation:  Final Status - Patient was not successfully resuscitated. What is current rhythm? asystole  Patient died at 1:02 am on 2023-05-05  Miscellaneous Information:  Labs sent, including: None  Primary team notified?  Yes  Family Notified? Unable to get in touch with patient's husband  Additional notes/ transfer status:  [facility] was notified.   Patient likely passed away from obstructive shock due to pulmonary emboli. Bedside US during code showed severely dilated RV with significant lack of contraction. Patient would regain sinus rhythm briefly after multiple rounds of CPR, but would become bradycardic and hypotensive despite being titrated to maxed out levo, epi and vaso. Multiple pushes of bicarb and epi were given throughout rounds of CPR. One dose of calcium and magnesium were administered. Please see code record for full details.    Melody Comas, MD [facility] Office: [phone]   See Amion for personal pager PCCM on call pager  [phone] until 7pm. Please call [facility] 7p-7a. [phone]

## 2023-05-05 NOTE — Procedures (Signed)
Intubation Procedure Note  NALLEY RAVEL  [id number]  [birth year]  Date:04/20/2023  Time:1:01 AM   Provider Performing:Nivea Wojdyla Celine Mans    Procedure: Intubation (31500)  Indication(s) Respiratory Failure  Consent Unable to obtain consent due to emergent nature of procedure.   Anesthesia None   Time Out Verified patient identification, verified procedure, site/side was marked, verified correct patient position, special equipment/implants available, medications/allergies/relevant history reviewed, required imaging and test results available.   Sterile Technique Usual hand hygeine, masks, and gloves were used   Procedure Description Patient positioned in bed supine.  Sedation given as noted above.  Patient was intubated with endotracheal tube using Glidescope.  View was Grade 1 full glottis .  Number of attempts was 1.  Colorimetric CO2 detector was consistent with tracheal placement.   Complications/Tolerance None; patient tolerated the procedure well. Chest X-ray is ordered to verify placement.   EBL Minimal   Specimen(s) None   Rutherford Guys, PA - [facility] For pager details, please see AMION or use Epic chat  After 1900, please call [facility] for cross coverage needs 04/20/2023, 1:01 AM

## 2023-05-05 DEATH — deceased

## 2023-05-07 ENCOUNTER — Ambulatory Visit: Payer: PPO

## 2023-05-07 ENCOUNTER — Other Ambulatory Visit: Payer: PPO

## 2023-05-08 ENCOUNTER — Ambulatory Visit: Payer: PPO | Admitting: Physician Assistant

## 2023-05-08 DIAGNOSIS — G9332 Myalgic encephalomyelitis/chronic fatigue syndrome: Secondary | ICD-10-CM | POA: Diagnosis not present

## 2023-05-08 DIAGNOSIS — R296 Repeated falls: Secondary | ICD-10-CM | POA: Diagnosis not present

## 2023-05-14 ENCOUNTER — Ambulatory Visit: Payer: PPO | Admitting: Internal Medicine

## 2023-05-14 ENCOUNTER — Encounter: Payer: PPO | Admitting: Nutrition

## 2023-05-14 ENCOUNTER — Other Ambulatory Visit: Payer: PPO

## 2023-05-14 ENCOUNTER — Ambulatory Visit: Payer: PPO

## 2023-05-20 ENCOUNTER — Ambulatory Visit: Payer: PPO | Admitting: Physician Assistant

## 2023-05-20 ENCOUNTER — Ambulatory Visit: Payer: PPO

## 2023-05-20 ENCOUNTER — Other Ambulatory Visit: Payer: PPO

## 2023-05-21 ENCOUNTER — Other Ambulatory Visit: Payer: PPO

## 2023-05-28 ENCOUNTER — Other Ambulatory Visit: Payer: PPO

## 2023-06-01 MED FILL — Medication: Qty: 1 | Status: AC

## 2023-06-04 ENCOUNTER — Other Ambulatory Visit: Payer: PPO

## 2023-06-04 ENCOUNTER — Ambulatory Visit: Payer: PPO

## 2023-06-04 ENCOUNTER — Ambulatory Visit: Payer: PPO | Admitting: Physician Assistant

## 2023-07-18 ENCOUNTER — Institutional Professional Consult (permissible substitution): Payer: PPO | Admitting: Psychology

## 2023-07-18 ENCOUNTER — Ambulatory Visit: Payer: Self-pay

## 2023-07-25 ENCOUNTER — Encounter: Payer: PPO | Admitting: Psychology

## 2023-08-01 ENCOUNTER — Ambulatory Visit: Payer: PPO | Admitting: Physician Assistant
